# Patient Record
Sex: Female | Born: 1975
Health system: Southern US, Community
[De-identification: ages and names within clinical notes are randomized; demographics above are authoritative.]

## PROBLEM LIST (undated history)

## (undated) DIAGNOSIS — R45851 Suicidal ideations: Secondary | ICD-10-CM

## (undated) DIAGNOSIS — F3010 Manic episode without psychotic symptoms, unspecified: Secondary | ICD-10-CM

## (undated) DIAGNOSIS — F314 Bipolar disorder, current episode depressed, severe, without psychotic features: Secondary | ICD-10-CM

## (undated) DIAGNOSIS — F19929 Other psychoactive substance use, unspecified with intoxication, unspecified: Secondary | ICD-10-CM

## (undated) DIAGNOSIS — F332 Major depressive disorder, recurrent severe without psychotic features: Secondary | ICD-10-CM

## (undated) DIAGNOSIS — F1994 Other psychoactive substance use, unspecified with psychoactive substance-induced mood disorder: Secondary | ICD-10-CM

## (undated) DIAGNOSIS — R45850 Homicidal ideations: Secondary | ICD-10-CM

## (undated) DIAGNOSIS — J45909 Unspecified asthma, uncomplicated: Secondary | ICD-10-CM

## (undated) DIAGNOSIS — F1414 Cocaine abuse with cocaine-induced mood disorder: Secondary | ICD-10-CM

## (undated) DIAGNOSIS — J449 Chronic obstructive pulmonary disease, unspecified: Secondary | ICD-10-CM

## (undated) DIAGNOSIS — F32A Depression, unspecified: Secondary | ICD-10-CM

## (undated) DIAGNOSIS — F329 Major depressive disorder, single episode, unspecified: Secondary | ICD-10-CM

## (undated) HISTORY — PX: FINGER SURGERY: SHX640

---

## 2012-07-02 ENCOUNTER — Emergency Department (HOSPITAL_COMMUNITY)
Admission: EM | Admit: 2012-07-02 | Discharge: 2012-07-02 | Disposition: A | Discharge status: Home / Self Care | Payer: Self-pay | Attending: Emergency Medicine | Admitting: Emergency Medicine

## 2012-07-02 ENCOUNTER — Encounter (HOSPITAL_COMMUNITY): Payer: Self-pay | Admitting: Emergency Medicine

## 2012-07-02 DIAGNOSIS — F17200 Nicotine dependence, unspecified, uncomplicated: Secondary | ICD-10-CM | POA: Insufficient documentation

## 2012-07-02 DIAGNOSIS — R21 Rash and other nonspecific skin eruption: Secondary | ICD-10-CM | POA: Insufficient documentation

## 2012-07-02 DIAGNOSIS — Z8614 Personal history of Methicillin resistant Staphylococcus aureus infection: Secondary | ICD-10-CM | POA: Insufficient documentation

## 2012-07-02 MED ORDER — FAMOTIDINE 20 MG PO TABS: 20.0000 mg | ORAL_TABLET | Freq: Two times a day (BID) | ORAL | Status: DC

## 2012-07-02 MED ORDER — DIPHENHYDRAMINE HCL 25 MG PO CAPS
25.0000 mg | ORAL_CAPSULE | Freq: Once | ORAL | Status: AC
  Administered 2012-07-02: 25 mg via ORAL
  Filled 2012-07-02: qty 1

## 2012-07-02 MED ORDER — PREDNISONE 20 MG PO TABS: 60.0000 mg | ORAL_TABLET | Freq: Every day | ORAL | Status: AC

## 2012-07-02 MED ORDER — FAMOTIDINE 20 MG PO TABS
20.0000 mg | ORAL_TABLET | Freq: Once | ORAL | Status: AC
  Administered 2012-07-02: 20 mg via ORAL
  Filled 2012-07-02: qty 1

## 2012-07-02 MED ORDER — PREDNISONE 20 MG PO TABS
60.0000 mg | ORAL_TABLET | Freq: Once | ORAL | Status: AC
  Administered 2012-07-02: 60 mg via ORAL
  Filled 2012-07-02: qty 1

## 2012-07-02 NOTE — ED Provider Notes (Signed)
History     CSN: [id number]  Arrival date & time 07/02/12  0905   First MD Initiated Contact with Patient 07/02/12 [phone]      Chief Complaint  Patient presents with  . Rash    red raised , itching spots on extremeties. None on torso    (Consider location/radiation/quality/duration/timing/severity/associated sxs/prior treatment) HPI Comments: Patient reports that she has had a rash located on both arms and both legs for the past 5 days.  No rash on her trunk.  Rash is unchanged.  The rash does itch, but is not painful.  She has taken Benadryl, but does not feel that it helped.  She denies any recent changes in lotions, soaps, or detergents.  She has not been evaluated by anyone prior to coming to the ED today.  Patient is a 36 y.o. female presenting with rash. The history is provided by the patient.  Rash  This is a new problem. Episode onset: five days ago. The problem has not changed since onset.The problem is associated with nothing. There has been no fever. The rash is present on the right arm, left arm, left upper leg, left lower leg, right upper leg and right lower leg. The patient is experiencing no pain. Associated symptoms include itching. Pertinent negatives include no blisters, no pain and no weeping. She has tried antihistamines for the symptoms. The treatment provided no relief.    Past Medical History  Diagnosis Date  . MRSA (methicillin resistant Staphylococcus aureus)     surgery on finger 3 years ago    Past Surgical History  Procedure Date  . Finger surgery     Family History  Problem Relation Age of Onset  . Diabetes Mother   . Hypertension Mother     History  Substance Use Topics  . Smoking status: Current Everyday Smoker    Types: Cigarettes  . Smokeless tobacco: Not on file  . Alcohol Use: Yes    OB History    Grav Para Term Preterm Abortions TAB SAB Ect Mult Living                  Review of Systems  Constitutional: Negative for fever and  chills.  Respiratory: Negative for shortness of breath.   Gastrointestinal: Negative for nausea and vomiting.  Skin: Positive for itching and rash.  All other systems reviewed and are negative.    Allergies  Review of patient's allergies indicates no known allergies.  Home Medications   Current Outpatient Rx  Name Route Sig Dispense Refill  . DIPHENHYDRAMINE HCL 25 MG PO TABS Oral Take 50 mg by mouth every 6 (six) hours as needed. Skin rashes    . FEXOFENADINE-PSEUDOEPHED ER 60-120 MG PO TB12 Oral Take 1 tablet by mouth 2 (two) times daily. allergy      BP 122/89  Pulse 85  Temp 97.9 F (36.6 C) (Oral)  Resp 18  SpO2 100%  LMP 06/26/2012  Physical Exam  Nursing note and vitals reviewed. Constitutional: She is oriented to person, place, and time. She appears well-developed and well-nourished. No distress.  HENT:  Head: Normocephalic and atraumatic.  Mouth/Throat: Oropharynx is clear and moist and mucous membranes are normal.       No sign of airway obstruction. No edema of face, eyelids, lips, tongue, uvula.Marland Kitchen Uvula midline, no nasal congestion or drooling.  Tongue not elevated. No trismus.  Neck: Trachea normal, normal range of motion and full passive range of motion without pain. Neck supple.  No carotid bruits or stridor  Cardiovascular: Normal rate, regular rhythm, normal heart sounds, intact distal pulses and normal pulses.        Not tachycardic  Pulmonary/Chest: Effort normal and breath sounds normal. No stridor. No respiratory distress. She has no wheezes.  Musculoskeletal: Normal range of motion.  Neurological: She is alert and oriented to person, place, and time.  Skin: Skin is warm, dry and intact. Rash noted. No petechiae and no purpura noted. Rash is urticarial. She is not diaphoretic.       Diffuse erythematous papular blanchable rash located on both arms and both legs.    Psychiatric: She has a normal mood and affect. Her behavior is normal.    ED  Course  Procedures (including critical care time)  Labs Reviewed - No data to display No results found.   No diagnosis found.    MDM  Patient given Prednisone, Benadryl and Pepcid in ED.  Patient re-evaluated prior to dc, is hemodynamically stable, in no respiratory distress, and denies the feeling of throat closing. Pt has been advised to take OTC benadryl & return to the ED if they have a mod-severe allergic rxn (s/s including throat closing, difficulty breathing, swelling of lips face or tongue). Patient also given prescription for Prednisone. Pt is to follow up with their PCP. Pt is agreeable with plan & verbalizes understanding.        Pascal Lux [place], PA-C 07/02/12 1606

## 2012-07-02 NOTE — ED Notes (Signed)
Pt reports itching on red raised areas on all extremeties. Pt was staying with relatives last weekend. Rash unresponsive to OTC meds

## 2012-07-05 NOTE — ED Provider Notes (Signed)
Medical screening examination/treatment/procedure(s) were performed by non-physician practitioner and as supervising physician I was immediately available for consultation/collaboration.  Toy Baker, MD 07/05/12 [phone]

## 2012-12-15 ENCOUNTER — Encounter (HOSPITAL_COMMUNITY): Payer: Self-pay | Admitting: *Deleted

## 2012-12-15 ENCOUNTER — Emergency Department (HOSPITAL_COMMUNITY)
Admission: EM | Admit: 2012-12-15 | Discharge: 2012-12-15 | Disposition: A | Discharge status: Home / Self Care | Payer: Self-pay | Attending: Emergency Medicine | Admitting: Emergency Medicine

## 2012-12-15 DIAGNOSIS — R0602 Shortness of breath: Secondary | ICD-10-CM | POA: Insufficient documentation

## 2012-12-15 DIAGNOSIS — R220 Localized swelling, mass and lump, head: Secondary | ICD-10-CM | POA: Insufficient documentation

## 2012-12-15 DIAGNOSIS — L509 Urticaria, unspecified: Secondary | ICD-10-CM

## 2012-12-15 DIAGNOSIS — F17200 Nicotine dependence, unspecified, uncomplicated: Secondary | ICD-10-CM | POA: Insufficient documentation

## 2012-12-15 DIAGNOSIS — Z8614 Personal history of Methicillin resistant Staphylococcus aureus infection: Secondary | ICD-10-CM | POA: Insufficient documentation

## 2012-12-15 DIAGNOSIS — R1310 Dysphagia, unspecified: Secondary | ICD-10-CM | POA: Insufficient documentation

## 2012-12-15 DIAGNOSIS — L500 Allergic urticaria: Secondary | ICD-10-CM | POA: Insufficient documentation

## 2012-12-15 MED ORDER — PREDNISONE 10 MG PO TABS: 20.0000 mg | ORAL_TABLET | Freq: Every day | ORAL | Status: DC

## 2012-12-15 MED ORDER — FAMOTIDINE IN NACL 20-0.9 MG/50ML-% IV SOLN
20.0000 mg | Freq: Once | INTRAVENOUS | Status: AC
  Administered 2012-12-15: 20 mg via INTRAVENOUS
  Filled 2012-12-15: qty 50

## 2012-12-15 MED ORDER — METHYLPREDNISOLONE SODIUM SUCC 125 MG IJ SOLR
125.0000 mg | Freq: Once | INTRAMUSCULAR | Status: AC
  Administered 2012-12-15: 125 mg via INTRAVENOUS
  Filled 2012-12-15: qty 2

## 2012-12-15 NOTE — ED Notes (Signed)
Pt c/o swelling, itching, and welps since last night.  Has taken 4 Benedryl today.  Lower lip swelling, welts on neck, c/o SOB, difficulty swallowing.

## 2012-12-15 NOTE — ED Provider Notes (Signed)
History     CSN: [id number]  Arrival date & time 12/15/12  2014   First MD Initiated Contact with Patient 12/15/12 2032      Chief Complaint  Patient presents with  . Allergic Reaction    (Consider location/radiation/quality/duration/timing/severity/associated sxs/prior treatment) HPI Patient with rash began on neck last night now with diffuse rash.  Patient took benadryl 50 mg benadry three times today without relief.  Patient with no known exposure.  Patient without similar symptoms.  No new exposures.  Patient states she has some itching of throat and feels fb sensation.  No nausea or vomiting or dyspnea.    Past Medical History  Diagnosis Date  . MRSA (methicillin resistant Staphylococcus aureus)     surgery on finger 3 years ago    Past Surgical History  Procedure Date  . Finger surgery     Family History  Problem Relation Age of Onset  . Diabetes Mother   . Hypertension Mother     History  Substance Use Topics  . Smoking status: Current Every Day Smoker    Types: Cigarettes  . Smokeless tobacco: Not on file  . Alcohol Use: Yes    OB History    Grav Para Term Preterm Abortions TAB SAB Ect Mult Living                  Review of Systems  Constitutional: Negative for fever, chills, activity change, appetite change and unexpected weight change.  HENT: Negative for sore throat, rhinorrhea, neck pain, neck stiffness and sinus pressure.   Eyes: Negative for visual disturbance.  Respiratory: Negative for cough and shortness of breath.   Cardiovascular: Negative for chest pain and leg swelling.  Gastrointestinal: Negative for vomiting, abdominal pain, diarrhea and blood in stool.  Genitourinary: Negative for dysuria, urgency, frequency, vaginal discharge and difficulty urinating.  Musculoskeletal: Negative for myalgias, arthralgias and gait problem.  Skin: Negative for color change and rash.  Neurological: Negative for weakness, light-headedness and headaches.   Hematological: Does not bruise/bleed easily.  Psychiatric/Behavioral: Negative for dysphoric mood.    Allergies  Review of patient's allergies indicates no known allergies.  Home Medications   Current Outpatient Rx  Name  Route  Sig  Dispense  Refill  . DIPHENHYDRAMINE HCL 25 MG PO TABS   Oral   Take 50 mg by mouth every 6 (six) hours as needed. Skin rashes         . FAMOTIDINE 20 MG PO TABS   Oral   Take 1 tablet (20 mg total) by mouth 2 (two) times daily.   10 tablet   0   . FEXOFENADINE-PSEUDOEPHED ER 60-120 MG PO TB12   Oral   Take 1 tablet by mouth 2 (two) times daily. allergy           There were no vitals taken for this visit.  Physical Exam  Nursing note and vitals reviewed. Constitutional: She appears well-developed and well-nourished.  HENT:  Head: Normocephalic and atraumatic.  Eyes: Conjunctivae normal and EOM are normal. Pupils are equal, round, and reactive to light.  Neck: Normal range of motion. Neck supple.  Cardiovascular: Normal rate, regular rhythm, normal heart sounds and intact distal pulses.   Pulmonary/Chest: Effort normal and breath sounds normal.  Abdominal: Soft. Bowel sounds are normal.  Musculoskeletal: Normal range of motion.  Neurological: She is alert.  Skin: Skin is warm and dry. Rash noted.       Hives on lue and neck and bilateral  lower extremities.   Psychiatric: She has a normal mood and affect. Thought content normal.    ED Course  Procedures (including critical care time)  Labs Reviewed - No data to display No results found.   No diagnosis found.    MDM  Patient observed for one hour and continues to have clear lungs with normal or oropharynx exam, no difficulty swallowing or breathing.Plan discharge with prednisone and advised to follow up with allergist.         Hilario Quarry, MD 12/15/12 2116

## 2013-01-24 ENCOUNTER — Emergency Department (HOSPITAL_COMMUNITY)
Admission: EM | Admit: 2013-01-24 | Discharge: 2013-01-24 | Disposition: A | Discharge status: Home / Self Care | Payer: Self-pay | Attending: Emergency Medicine | Admitting: Emergency Medicine

## 2013-01-24 ENCOUNTER — Encounter (HOSPITAL_COMMUNITY): Payer: Self-pay | Admitting: *Deleted

## 2013-01-24 DIAGNOSIS — L03211 Cellulitis of face: Secondary | ICD-10-CM | POA: Insufficient documentation

## 2013-01-24 DIAGNOSIS — H5789 Other specified disorders of eye and adnexa: Secondary | ICD-10-CM | POA: Insufficient documentation

## 2013-01-24 DIAGNOSIS — Z79899 Other long term (current) drug therapy: Secondary | ICD-10-CM | POA: Insufficient documentation

## 2013-01-24 DIAGNOSIS — F17200 Nicotine dependence, unspecified, uncomplicated: Secondary | ICD-10-CM | POA: Insufficient documentation

## 2013-01-24 DIAGNOSIS — L0201 Cutaneous abscess of face: Secondary | ICD-10-CM | POA: Insufficient documentation

## 2013-01-24 DIAGNOSIS — Z8614 Personal history of Methicillin resistant Staphylococcus aureus infection: Secondary | ICD-10-CM | POA: Insufficient documentation

## 2013-01-24 DIAGNOSIS — L03213 Periorbital cellulitis: Secondary | ICD-10-CM

## 2013-01-24 MED ORDER — OXYCODONE-ACETAMINOPHEN 5-325 MG PO TABS: 1.0000 | ORAL_TABLET | ORAL | Status: DC | PRN

## 2013-01-24 MED ORDER — CLINDAMYCIN HCL 300 MG PO CAPS: ORAL_CAPSULE | ORAL | Status: DC

## 2013-01-24 MED ORDER — CLINDAMYCIN PHOSPHATE 900 MG/50ML IV SOLN
900.0000 mg | Freq: Once | INTRAVENOUS | Status: AC
  Administered 2013-01-24: 900 mg via INTRAVENOUS
  Filled 2013-01-24: qty 50

## 2013-01-24 NOTE — ED Notes (Signed)
Pt here with left eye swollen shut.  Pt advises eye has been swelling x 2 days and had gotten worse last night. Pt denies any pain to same.

## 2013-01-24 NOTE — ED Notes (Addendum)
Pt refused peripheral IV. Pt explained benefits and still refused. Dr. Shela Commons made aware.

## 2013-01-24 NOTE — ED Notes (Signed)
I&D kit set up at bedside

## 2013-01-24 NOTE — ED Provider Notes (Signed)
Patient with swelling over her left thigh which started as a "pimple" several days ago I has become swollen shut since yesterday. On exam there is a 1 cm abscess overlying left eyebrow eyelids are swollen and reddened eyes swollen shut upon my eyelids apart she the eye appears normal and she exhibits no pain on extraocular movement.left eyebrow IwithNCISION AND DRAINAGE Performed by: Doug Sou Consent: Verbal consent obtained. Risks and benefits: risks, benefits and alternatives were discussed Type: abscess  Body area:   Anesthesia: local infiltration  Incision was made with a scalpel.  Local anesthetic: lidocaine 2% with epinephrine  Anesthetic total: 1 ml  Complexity: complex Blunt dissection to break up loculations  Drainage: purulent  Drainage amount: moderate    Patient tolerance: Patient tolerated the procedure well with no immediate complications.  Assessmenrt patient exhibiting para orbital cellulitis as a complication of abscess to left eyebrow  Doug Sou, MD 01/24/13 1237

## 2013-01-24 NOTE — ED Notes (Signed)
Cleocin prescription instructions changed per dr Blinda Leatherwood to 150mg , 2 pills tid x10 days, dispense 60 (pharmacy called in with questions).

## 2013-01-24 NOTE — ED Provider Notes (Signed)
History     CSN: [id number]  Arrival date & time 01/24/13  4098   First MD Initiated Contact with Patient 01/24/13 1040      Chief Complaint  Patient presents with  . Facial Swelling    (Consider location/radiation/quality/duration/timing/severity/associated sxs/prior treatment) HPI Comments: 37 y.o. female presents today complaining of acute onset swelling of left eye. Pt says it started as a pimple in the eyebrow of left eye and then last night she felt it swelling up as she slept. Pt is unable to open eye spontaneously. Pt states though it is swollen, it is not very painful. Pt states she had a similar reaction to an unknown allergen and was treated with Prednisone. Course is constant. Pt has taken no interventions. Nothing makes it better or worse. Pt denies decreased vision, photophobia, recent fever, nausea/vomiting, numbness, trauma, or known allergen contact.  Patient is a 37 y.o. female presenting with eye problem.  Eye Problem  Associated symptoms include eye redness. Pertinent negatives include no numbness, no discharge, no photophobia, no nausea, no vomiting and no weakness.    Past Medical History  Diagnosis Date  . MRSA (methicillin resistant Staphylococcus aureus)     surgery on finger 3 years ago    Past Surgical History  Procedure Date  . Finger surgery     Family History  Problem Relation Age of Onset  . Diabetes Mother   . Hypertension Mother     History  Substance Use Topics  . Smoking status: Current Every Day Smoker -- 0.5 packs/day    Types: Cigarettes  . Smokeless tobacco: Not on file  . Alcohol Use: Yes    OB History    Grav Para Term Preterm Abortions TAB SAB Ect Mult Living                  Review of Systems  Constitutional: Negative for fever and diaphoresis.  HENT: Negative for neck pain and neck stiffness.   Eyes: Positive for pain and redness. Negative for photophobia, discharge, itching and visual disturbance.  Respiratory:  Negative for apnea, chest tightness and shortness of breath.   Cardiovascular: Negative for chest pain and palpitations.  Gastrointestinal: Negative for nausea, vomiting, diarrhea and constipation.  Genitourinary: Negative for dysuria.  Musculoskeletal: Negative for gait problem.  Skin: Negative for rash.  Neurological: Negative for dizziness, weakness, light-headedness, numbness and headaches.    Allergies  Review of patient's allergies indicates no known allergies.  Home Medications   Current Outpatient Rx  Name  Route  Sig  Dispense  Refill  . DIPHENHYDRAMINE HCL 25 MG PO TABS   Oral   Take 50 mg by mouth every 6 (six) hours as needed. Skin rashes         . IBUPROFEN 200 MG PO TABS   Oral   Take 600 mg by mouth every 6 (six) hours as needed.         Marland Kitchen PREDNISONE 10 MG PO TABS   Oral   Take 2 tablets (20 mg total) by mouth daily.   15 tablet   0     BP 134/88  Pulse 80  Temp 98.3 F (36.8 C) (Oral)  Resp 18  SpO2 100%  LMP 01/10/2013  Physical Exam  Nursing note and vitals reviewed. Constitutional: She is oriented to person, place, and time. She appears well-developed and well-nourished. No distress.  HENT:  Head: Normocephalic and atraumatic.  Eyes: EOM are normal. Pupils are equal, round, and reactive to light.  Left eye exhibits no discharge and no exudate. Left conjunctiva is injected. Left eye exhibits normal extraocular motion and no nystagmus.       Left eye is swollen shut and unable to open spontaneously, visual acuity 20/40 when eye is held open. No pain to EOM.  Neck: Normal range of motion. Neck supple.       No meningeal signs  Cardiovascular: Normal rate, regular rhythm and normal heart sounds.  Exam reveals no gallop and no friction rub.   No murmur heard. Pulmonary/Chest: Effort normal and breath sounds normal. No respiratory distress. She has no wheezes. She has no rales. She exhibits no tenderness.  Abdominal: Soft. Bowel sounds are normal.  She exhibits no distension. There is no tenderness. There is no rebound and no guarding.  Musculoskeletal: Normal range of motion. She exhibits no edema and no tenderness.  Neurological: She is alert and oriented to person, place, and time. No cranial nerve deficit.       No focal deficits. Cranial nerves intact.  Skin: Skin is warm and dry. She is not diaphoretic. No erythema.    ED Course  Procedures (including critical care time)  Labs Reviewed - No data to display No results found.   Diagnosis: periorbital cellulitis    MDM  Dr. Ethelda Chick examined and performed I&D of abscess under left eyelid. Periorbital cellulitis to be treated with clindamycin and pain managed with Percocet.  At this time there does not appear to be any evidence of an acute emergency medical condition and the patient appears stable for discharge with appropriate outpatient follow up in 2 days.  Glade Nurse, PA-C 01/24/13 2213

## 2013-01-24 NOTE — Discharge Instructions (Signed)
1. Warm compresses over left eye 2. Pain medicine as directed as needed 3. Follow up in 2 days to have the eye checked (see resource guide)   Periorbital Cellulitis Periorbital cellulitis is a common infection that can affect the eyelid and the soft tissues that surround the eyeball. The infection may also affect the structures that produce and drain tears. It does not affect the eyeball itself. Natural tissue barriers usually prevent the spread of this infection to the eyeball and other deeper areas of the eye socket.  CAUSES  Bacterial infection.  Long-term (chronic) sinus infections.  An object (foreign body) stuck behind the eye.  An injury that goes through the eyelid tissues.  An injury that causes an infection, such as an insect sting.  Fracture of the bone around the eye.  Infections which have spread from the eyelid or other structures around the eye.  Bite wounds.  Inflammation or infection of the lining membranes of the brain (meningitis).  An infection in the blood (septicemia).  Dental infection (abscess).  Viral infection (this is rare). SYMPTOMS Symptoms usually come on suddenly.  Pain in the eye.  Red, hot, and swollen eyelids and possibly cheeks. The swelling is sometimes bad enough that the eyelids cannot open. Some infections make the eyelids look purple.  Fever and feeling generally ill.  Pain when touching the area around the eye. DIAGNOSIS  Periorbital cellulitis can be diagnosed from an eye exam. In severe cases, your caregiver might suggest:  Blood tests.  Imaging tests (such as a CT scan) to examine the sinuses and the area around and behind the eyeball. TREATMENT If your caregiver feels that you do not have any signs of serious infection, treatment may include:  Antibiotics.  Nasal decongestants to reduce swelling.  Referral to a dentist if it is suspected that the infection was caused by a prior tooth infection.  Examination  every day to make sure the problem is improving. HOME CARE INSTRUCTIONS  Take your antibiotics as directed. Finish them even if you start to feel better.  Some pain is normal with this condition. Take pain medicine as directed by your caregiver. Only take pain medicines approved by your caregiver.  It is important to drink fluids. Drink enough water and fluids to keep your urine clear or pale yellow.  Do not smoke.  Rest and get plenty of sleep.  Mild or moderate fevers generally have no long-term effects and often do not require treatment.  If your caregiver has given you a follow-up appointment, it is very important to keep that appointment. Your caregiver will need to make sure that the infection is getting better. It is important to check that a more serious infection is not developing. SEEK IMMEDIATE MEDICAL CARE IF:  Your eyelids become more painful, red, warm, or swollen.  You develop double vision or your vision becomes blurred or worsens in any way.  You have trouble moving your eyes.  The eye looks like it is popping out (proptosis).  You develop a severe headache, severe neck pain, or neck stiffness.  You develop repeated vomiting.  You have a fever or persistent symptoms for more than 72 hours.  You have a fever and your symptoms suddenly get worse. MAKE SURE YOU:  Understand these instructions.  Will watch your condition.  Will get help right away if you are not doing well or get worse. Document Released: 01/16/2011 Document Revised: 03/07/2012 Document Reviewed: 01/16/2011 [facility] Patient Information 2013 [place], [place].  RESOURCE GUIDE  Chronic Pain Problems: Contact [facility]  [phone] Patients need to be referred by their primary care doctor.  Insufficient Money for Medicine: Contact United Way:  call "211."   No Primary Care Doctor: - Call Health Connect  [phone] - can help you locate a primary care doctor that  accepts  your insurance, provides certain services, etc. - Physician Referral Service- [phone]  Agencies that provide inexpensive medical care: - [facility]  [phone] - [facility]  [phone] - [facility]  [phone] - Women's Clinic  [phone] - Planned Parenthood  [phone] - [facility]  [phone]  Medicaid-accepting [facility] Providers: - [facility]- [address]  [phone], Mon-Fri 9am-7pm, Sat 9am-1pm - [facility]- [address] [place], Suite [place]  [phone] - [facility]- [address], Suite [place]  [phone] [facility]- [address]  [phone] - Renaye Rakers- [address] [place], [address], [phone]  Only accepts [place] patients after they have their name  applied to their card  Self Pay (no insurance) in [place]: - Sickle Cell Patients: Dr Willey Blade, [facility]  [address] [place], [phone] - [facility] Urgent Care- [address] [place]  [phone]       [facility] [place]- [address]       -     [facility]- see information above (Speak to [employer] if you do not have insurance)       -  [facility]- [address],  [phone]       -  [facility]- [address], [phone]       -  Dr Julio Sicks-  [address], [place], [phone]       -  [facility] - [address] [phone]       -  [facility]- [address] [phone], also [address] [phone]       -    [facility]- [address] [place], [phone], 1st & 3rd Saturday        every month, 10am-1pm  1) Find a Doctor and Pay Out of Pocket Although you won't have to find out who is covered by your insurance plan, it is a good idea to ask around and get recommendations. You  will then need to call the office and see if the doctor you have chosen will accept you as a new patient and what types of options they offer for patients who are self-pay. Some doctors offer discounts or will set up payment plans for their patients who do not have insurance, but you will need to ask so you aren't surprised when you get to your appointment.  2) Contact Your Local Health Department Not all health departments have doctors that can see patients for sick visits, but many do, so it is worth a call to see if yours does. If you don't know where your local health department is, you can check in your phone book. The CDC also has a tool to help you locate your state's health department, and many state websites also have listings of all of their local health departments.  3) Find a Walk-in  Clinic If your illness is not likely to be very severe or complicated, you may want to try a walk in clinic. These are popping up all over the country in pharmacies, drugstores, and shopping centers. They're usually staffed by nurse practitioners or physician assistants that have been trained to treat common illnesses and complaints. They're usually fairly quick and inexpensive. However, if you have serious medical issues or chronic medical problems, these are probably not your best option  STD Testing - [facility] Department of [facility], STD Clinic, [address], [place], phone [phone] or [phone].  Monday - Friday, call for an appointment. [facility] of [employer], STD Clinic, [address], [place], phone [phone] or [phone].  Monday - Friday, call for an appointment.  Abuse/Neglect: [facility] Child Abuse Hotline 478-014-3976 [facility] Child Abuse Hotline 505-021-4579 (After Hours)  Emergency Shelter:  [facility] (873)[phone]  Maternity Homes: - Room at the [place] [facility] [phone] - [facility] (920)[phone]  MRSA Hotline #:   951-329-5811  [facility] [place]  [employer] [facility] Dept. [address]                 [address]         [address] [place] [address]  Blondell Reveal Phone:  [phone]                                  Phone:  [phone]                   Phone:  [phone]  [facility], [phone] - [facility] - [facility] [place]- [phone]       -     [facility] in [place], [address],             [phone], Insurance  [place] Child Abuse Hotline 629-383-5079 or (445) 564-6097 (After Hours)  Dental Assistance  If unable to pay or uninsured, contact:  [facility]. to become qualified for the adult dental clinic.  Patients with Medicaid: [facility] 716-485-7564 [address], [phone] [address] [phone]  If unable to pay, or uninsured, contact [facility] [phone] in [place], [phone] in [facility]) to become qualified for the adult dental clinic  Other Low-Cost Community Dental Services: - [facility]- [address] [place], [place], [place], [address], [phone], Ext. [address], 2nd and 4th Thursday of the month at 6:30am.  10 clients each day by appointment, can sometimes see walk-in patients if someone does not show for an appointment. [facility] [address] [place] [place], [place], [address], [phone] - [facility]- [address] [place], [place], [address], [phone] [facility] Health Department- [phone] - [facility]  Department- [phone] [facility] [phone]       Behavioral Health Resources in the [facility]  Intensive Outpatient  Programs: [facility]      [address] [place], [place] [phone] Both a day and evening program       [facility] Outpatient     [address]        [place], [place] [address] [phone]         ADS: [facility] [address] [place] [phone]  [facility] Mental Health ACCESS LINE: (807)857-0607 or [phone] [address] [place], [place] [address] [email or web address]  Behavioral Health Services  Substance Abuse Resources: - [facility] Drug Services  (402)679-1373 - [facility] 7804906828 - [facility] [phone] Floydene Flock [phone] - Residential & Outpatient Substance Abuse Program  [phone]  Psychological Services: [facility]  [phone] [facility] Services  [phone]) [phone] - [facility], 502-037-6681 [place]. [address] [place], ACCESS LINE: 5192416702 or (765)[phone], [email or web address]  Mobile Crisis Teams:                                        Therapeutic Alternatives         Mobile Crisis Care Unit [phone]             [facility] [address] Ginette Otto [phone]                                         Interventionist [address] [place] [phone]  Self-Help/Support Groups: Mental Health Assoc. of [employer] of support groups 442-586-6705 (call for more info)   Narcotics Anonymous (NA) Caring Services [address] [place] - 2 meetings at this location  Residential Treatment Programs:  [facility]      [address]        [place]       [phone]         [facility] [address] [place] [address] [place], [place]  [address] [phone]  [facility]  [address] [place] [address] [place], [place]  [address] [phone] Admissions: 8am-3pm M-F  [facility]     [address]        [place], [place] [address]       [phone]         [facility] [address] [place], [place] [phone]  [facility] [address] [place], [place] [phone]  [facility] - Intensive Outpatient      [address]     [place], [place]       [phone]         [facility] ([facility]     [address] [place], [place] [phone] or [phone])[phone]  Residential Treatment Services (RTS), Medicaid [address] [place], [place] [phone]  [facility] Margo Aye                                               [phone]  Otho Perl [place] [phone]  [facility] Resources: [facility] [place]- [phone]               General Therapy                                                Angie Fava, PhD        [address] [place], [place] [address]         [phone]   Insurance  [facility]   [address] [place], [place] [address] [phone]  [facility] Recovery [address] [place], [place] [address] [phone] Insurance/Medicaid/sponsorship through [facility]                                              [address]                                        [place], [place] [address]    Therapy/tele-psych/case         [phone]          [facility] [address], [place]  [address]  Adolescent/group home/case management [phone]                                           Creola Corn PhD       General therapy       Insurance   [phone]         Dr. Lolly Mustache, Insurance, M-F [phone]

## 2013-01-24 NOTE — ED Notes (Signed)
Dr. Shela Commons spoke with pt about need for IV, pt reported she is just nervous and said she would allow one now.

## 2013-01-24 NOTE — ED Notes (Signed)
Pt laying in bed, pt has left eye swelling. Pt reports she has some drainage from eye and tender to touch.

## 2013-01-26 NOTE — ED Provider Notes (Signed)
Medical screening examination/treatment/procedure(s) were conducted as a shared visit with non-physician practitioner(s) and myself.  I personally evaluated the patient during the encounter  Doug Sou, MD 01/26/13 [phone]

## 2013-04-14 ENCOUNTER — Encounter (HOSPITAL_COMMUNITY): Payer: Self-pay | Admitting: Emergency Medicine

## 2013-04-14 ENCOUNTER — Emergency Department (HOSPITAL_COMMUNITY)
Admission: EM | Admit: 2013-04-14 | Discharge: 2013-04-14 | Disposition: A | Discharge status: Home / Self Care | Payer: Self-pay | Attending: Emergency Medicine | Admitting: Emergency Medicine

## 2013-04-14 DIAGNOSIS — IMO0001 Reserved for inherently not codable concepts without codable children: Secondary | ICD-10-CM | POA: Insufficient documentation

## 2013-04-14 DIAGNOSIS — F17200 Nicotine dependence, unspecified, uncomplicated: Secondary | ICD-10-CM | POA: Insufficient documentation

## 2013-04-14 DIAGNOSIS — Z8614 Personal history of Methicillin resistant Staphylococcus aureus infection: Secondary | ICD-10-CM | POA: Insufficient documentation

## 2013-04-14 DIAGNOSIS — M79671 Pain in right foot: Secondary | ICD-10-CM

## 2013-04-14 DIAGNOSIS — M79609 Pain in unspecified limb: Secondary | ICD-10-CM | POA: Insufficient documentation

## 2013-04-14 DIAGNOSIS — B351 Tinea unguium: Secondary | ICD-10-CM | POA: Insufficient documentation

## 2013-04-14 LAB — GLUCOSE, CAPILLARY: Glucose-Capillary: 99 mg/dL (ref 70–99)

## 2013-04-14 MED ORDER — CLOTRIMAZOLE 1 % EX CREA: TOPICAL_CREAM | CUTANEOUS | Status: DC

## 2013-04-14 MED ORDER — IBUPROFEN 600 MG PO TABS: 600.0000 mg | ORAL_TABLET | Freq: Four times a day (QID) | ORAL | Status: DC | PRN

## 2013-04-14 NOTE — ED Notes (Signed)
NAD noted at time of d/c home 

## 2013-04-14 NOTE — ED Provider Notes (Signed)
History     CSN: [id number]  Arrival date & time 04/14/13  4098   First MD Initiated Contact with Patient 04/14/13 [phone]      Chief Complaint  Patient presents with  . Foot Pain    (Consider location/radiation/quality/duration/timing/severity/associated sxs/prior treatment) HPI  37 year old female presents complaining of foot pain. Patient reports for the past 2-3 weeks she has had pain to the bottom of both foot. Pain is primarily at the ball of her feet. Pain is described as a achy sensation and sharp while walking. She has been resting for the past 2 days which has improved the pain. Otherwise she has not had any specific treatment. She does wear this many different pair of shoes but no high heels. She denies any recent trauma. She denies any fever chills or rash. Denies any numbness or weakness. Denies any coolness sensation to the feet. She is currently not working. Does admits to occasional cocaine use last use several days ago. Reports having family history of diabetes and was concerned about having diabetes. Does report increase polyuria and polydipsia.  Past Medical History  Diagnosis Date  . MRSA (methicillin resistant Staphylococcus aureus)     surgery on finger 3 years ago    Past Surgical History  Procedure Laterality Date  . Finger surgery      Family History  Problem Relation Age of Onset  . Diabetes Mother   . Hypertension Mother     History  Substance Use Topics  . Smoking status: Current Every Day Smoker -- 0.50 packs/day    Types: Cigarettes  . Smokeless tobacco: Not on file  . Alcohol Use: Yes    OB History   Grav Para Term Preterm Abortions TAB SAB Ect Mult Living                  Review of Systems  Constitutional: Negative for fever.  Musculoskeletal: Positive for myalgias. Negative for back pain.  Skin: Negative for rash and wound.  Neurological: Negative for dizziness, tremors, weakness and numbness.    Allergies  Review of patient's  allergies indicates no known allergies.  Home Medications   Current Outpatient Rx  Name  Route  Sig  Dispense  Refill  . diphenhydrAMINE (BENADRYL) 25 MG tablet   Oral   Take 50 mg by mouth every 6 (six) hours as needed. Skin rashes           There were no vitals taken for this visit.  Physical Exam  Nursing note and vitals reviewed. Constitutional: She appears well-developed and well-nourished. No distress.  HENT:  Head: Atraumatic.  Eyes: Conjunctivae are normal.  Neck: Neck supple.  Cardiovascular: Intact distal pulses.   Musculoskeletal: Normal range of motion. She exhibits tenderness (mild tenderness to the ball of both feet without swelling, deformity, crepitus, or rash noted.  brisk cap refill .  Normal dorsi/plantar flexion.  evidence of onychomycosis to toenails). She exhibits no edema.  Able to ambulate without difficulty  Neurological: She is alert.  Skin: Skin is warm. No rash noted.    ED Course  Procedures (including critical care time)  9:16 AM Patient with pain to the balls of her feet for the past several weeks. She is neurovascularly intact. No evidence of infection. No evidence of foreign object. She is able to ambulate without difficulty.  Pain is MSK.  Doubt gout.  No joint involvement. She does have evidence of mild onychomycosis to all toes. Lomitrin prescribed along with care instruction.  9:39 AM Normal CBG  Labs Reviewed  GLUCOSE, CAPILLARY   No results found.   1. Foot pain, bilateral   2. Onychomycosis of toenail       MDM  BP 120/93  Pulse 85  Temp(Src) 98 F (36.7 C) (Oral)  Ht 5\' 3"  (1.6 m)  Wt 160 lb (72.576 kg)  BMI 28.35 kg/m2  SpO2 100%  I have reviewed nursing notes and vital signs. I personally reviewed the imaging tests through PACS system  I reviewed available ER/hospitalization records thought the EMR         Fayrene Helper, [place] 04/14/13 0940

## 2013-04-14 NOTE — ED Provider Notes (Signed)
Medical screening examination/treatment/procedure(s) were performed by non-physician practitioner and as supervising physician I was immediately available for consultation/collaboration.    Nelia Shi, MD 04/14/13 [phone]

## 2013-04-14 NOTE — ED Notes (Signed)
Pt c/o bilateral foot pain to the bottom of both feet x3 weeks. Pain has increased the last 2 days and per pt "I've had to stay in the bed". Pain increases with activity. NAD noted, family/friend at bedside.

## 2013-04-14 NOTE — ED Notes (Signed)
Laveda Norman, PA-C in room at this time.

## 2013-04-14 NOTE — ED Notes (Signed)
Notified RN of CBG 99 

## 2013-12-28 NOTE — L&D Delivery Note (Signed)
Delivery Note At 1:00 AM a viable female was delivered via Vaginal, Spontaneous Delivery (Presentation: ROA).  APGAR: 8, ; weight TBD.   Placenta status: Intact, Spontaneous.  Cord: 3 vessels with the following complications: likely terminal abruption.   Anesthesia: None  Episiotomy: None Lacerations: none Suture Repair: na Est. Blood Loss (mL): 700  Mom to postpartum.  Baby to NICU.  [facility] to [facility] ED for a [id number] with unknown GA and no PNC who presented in labor with bleeding.  AROM performed once nicu arrived.  Pt was uncooperative and moving around in the bed and unwilling to push for an extended period of time.  She then started bleeding with approx 500cc of blood coming out onto the bed over the course of the next 10 min.  FHT remained reassuring.  An anterior lip was pushed back without difficulty.  Finally pt chose to push and with 2 pushes delivered a liveborn female via NSVD with spontaneous cry.   Cord cut and clamped quickly and baby taken to warmer with NICU.  Placenta delivered intact with 3V cord via traction and pitocin.  no tear. No complications.  [facility] for [facility] care.  Baby to NICU.    Dorothie Wah L Rashea [place] 06/05/2014, 2:02 AM

## 2014-06-04 ENCOUNTER — Inpatient Hospital Stay (HOSPITAL_COMMUNITY)
Admission: EM | Admit: 2014-06-04 | Discharge: 2014-06-06 | DRG: 775 | Disposition: A | Discharge status: Home / Self Care | Payer: Medicaid Other | Attending: Obstetrics and Gynecology | Admitting: Obstetrics and Gynecology

## 2014-06-04 ENCOUNTER — Encounter (HOSPITAL_COMMUNITY): Payer: Self-pay | Admitting: Emergency Medicine

## 2014-06-04 DIAGNOSIS — F1410 Cocaine abuse, uncomplicated: Secondary | ICD-10-CM | POA: Diagnosis present

## 2014-06-04 DIAGNOSIS — O99334 Smoking (tobacco) complicating childbirth: Secondary | ICD-10-CM | POA: Diagnosis present

## 2014-06-04 DIAGNOSIS — Z8614 Personal history of Methicillin resistant Staphylococcus aureus infection: Secondary | ICD-10-CM

## 2014-06-04 DIAGNOSIS — F1210 Cannabis abuse, uncomplicated: Secondary | ICD-10-CM | POA: Diagnosis present

## 2014-06-04 DIAGNOSIS — O99344 Other mental disorders complicating childbirth: Secondary | ICD-10-CM | POA: Diagnosis present

## 2014-06-04 LAB — I-STAT CHEM 8, ED
BUN: 11 mg/dL (ref 6–23)
CALCIUM ION: 1.19 mmol/L (ref 1.12–1.23)
CHLORIDE: 102 meq/L (ref 96–112)
Creatinine, Ser: 0.8 mg/dL (ref 0.50–1.10)
GLUCOSE: 103 mg/dL — AB (ref 70–99)
HCT: 35 % — ABNORMAL LOW (ref 36.0–46.0)
Hemoglobin: 11.9 g/dL — ABNORMAL LOW (ref 12.0–15.0)
POTASSIUM: 3.5 meq/L — AB (ref 3.7–5.3)
Sodium: 138 mEq/L (ref 137–147)
TCO2: 19 mmol/L (ref 0–100)

## 2014-06-04 LAB — CBC
HEMATOCRIT: 32.7 % — AB (ref 36.0–46.0)
HEMOGLOBIN: 11.3 g/dL — AB (ref 12.0–15.0)
MCH: 31.4 pg (ref 26.0–34.0)
MCHC: 34.6 g/dL (ref 30.0–36.0)
MCV: 90.8 fL (ref 78.0–100.0)
Platelets: 183 10*3/uL (ref 150–400)
RBC: 3.6 MIL/uL — ABNORMAL LOW (ref 3.87–5.11)
RDW: 13.8 % (ref 11.5–15.5)
WBC: 13.5 10*3/uL — ABNORMAL HIGH (ref 4.0–10.5)

## 2014-06-04 MED ORDER — BETAMETHASONE SOD PHOS & ACET 6 (3-3) MG/ML IJ SUSP
12.0000 mg | Freq: Once | INTRAMUSCULAR | Status: AC
  Administered 2014-06-05: 12 mg via INTRAMUSCULAR
  Filled 2014-06-04: qty 2

## 2014-06-04 NOTE — ED Notes (Signed)
Dr. Beck at bedside

## 2014-06-04 NOTE — ED Notes (Signed)
MD at bedside. 

## 2014-06-04 NOTE — ED Notes (Signed)
Pt to ED with c/o being preg. And having contractions.  St's she has not had any prenatal care.  Also st's she has had bleeding and fluid coming from vagina.  St's she is G-4   P-3  With all 3 being premature at 6 months.

## 2014-06-04 NOTE — ED Provider Notes (Signed)
CSN: [id number]     Arrival date & time 06/04/14  2310 History   None    No chief complaint on file.  Level V caveat acuity of situation (Consider location/radiation/quality/duration/timing/severity/associated sxs/prior Treatment) HPI Complains of feeling uterine contractions onset 2 hours ago accompanied by slight amount of vaginal bleeding and slight amount of vaginal fluid. Contractions, possibly every 10 minutes. Patient had no prenatal care. No other associated symptoms. He is uncertain of her lastmenstrual  period. Past Medical History  Diagnosis Date  . MRSA (methicillin resistant Staphylococcus aureus)     surgery on finger 3 years ago   Past Surgical History  Procedure Laterality Date  . Finger surgery     Family History  Problem Relation Age of Onset  . Diabetes Mother   . Hypertension Mother    History  Substance Use Topics  . Smoking status: Current Every Day Smoker -- 0.50 packs/day    Types: Cigarettes  . Smokeless tobacco: Not on file  . Alcohol Use: Yes   denies alcohol use admits to crack cocaine use 3 weeks ago OB History   Grav Para Term Preterm Abortions TAB SAB Ect Mult Living                 Review of Systems  Unable to perform ROS: Acuity of condition      Allergies  Review of patient's allergies indicates no known allergies.  Home Medications   Prior to Admission medications   Medication Sig Start Date End Date Taking? Authorizing Provider  clotrimazole (LOTRIMIN) 1 % cream Apply to all toenails 2 times daily until symptoms cleared 04/14/13   Domenic Moras, PA-C  diphenhydrAMINE (BENADRYL) 25 MG tablet Take 50 mg by mouth every 6 (six) hours as needed. Skin rashes    Historical Provider, MD  ibuprofen (ADVIL,MOTRIN) 600 MG tablet Take 1 tablet (600 mg total) by mouth every 6 (six) hours as needed for pain. 04/14/13   Domenic Moras, PA-C   BP 127/78  Pulse 99  Resp 28  SpO2 99% Physical Exam  Nursing note and vitals reviewed. Constitutional: She  appears well-developed and well-nourished. She appears distressed.  Appears uncomfortable  HENT:  Head: Normocephalic and atraumatic.  Eyes: Conjunctivae are normal. Pupils are equal, round, and reactive to light.  Neck: Neck supple. No tracheal deviation present. No thyromegaly present.  Cardiovascular: Normal rate and regular rhythm.   No murmur heard. Pulmonary/Chest: Effort normal and breath sounds normal.  Abdominal: Soft. She exhibits distension. There is no tenderness.  Gravid fetal heart tones 135  Genitourinary:  No crowding. A digital vaginal exam wasn't performed after perineum was prepped with Betadine with sterile gloves. Patient's cervix is dilated . I feel the baby's head in the vaginal canal. Cervix feels dilated  Musculoskeletal: Normal range of motion. She exhibits no edema and no tenderness.  Neurological: She is alert. Coordination normal.  Skin: Skin is warm and dry. No rash noted.  Psychiatric: She has a normal mood and affect.    ED Course  Procedures (including critical care time) Labs Review Labs Reviewed - No data to display Patient examined by rapid response OB nurse and found to be 8 cm dilated, fully effaced. Obstetrician on-callDr. Elly Modena and neonatologistDr. [facility] consult by me. Delivery felt to be imminent Imaging Review No results found. Dr, Olevia Bowens, obstetrician arrived 11:55 PM  EKG Interpretation None     Baby was delivered by [facility] MDM  Mother and baby will be transferred to [facility] hospital Final  diagnoses:  None  Dx spontaneous vaginal delivery CRITICAL CARE Performed by: Orlie Dakin Total critical care time: 30 minute Critical care time was exclusive of separately billable procedures and treating other patients. Critical care was necessary to treat or prevent imminent or life-threatening deterioration. Critical care was time spent personally by me on the following activities: development of treatment plan with patient and/or  surrogate as well as nursing, discussions with consultants, evaluation of patient's response to treatment, examination of patient, obtaining history from patient or surrogate, ordering and performing treatments and interventions, ordering and review of laboratory studies, ordering and review of radiographic studies, pulse oximetry and re-evaluation of patient's condition.     Orlie Dakin, MD 06/05/14 [phone]

## 2014-06-04 NOTE — ED Notes (Signed)
OB rapid response at bedside 

## 2014-06-04 NOTE — ED Notes (Signed)
OB rapid response states pt is 8 cm dilated, 100% effaced

## 2014-06-05 ENCOUNTER — Encounter (HOSPITAL_COMMUNITY): Payer: Self-pay | Admitting: Emergency Medicine

## 2014-06-05 ENCOUNTER — Encounter: Payer: Self-pay | Admitting: Family Medicine

## 2014-06-05 DIAGNOSIS — Z8614 Personal history of Methicillin resistant Staphylococcus aureus infection: Secondary | ICD-10-CM | POA: Diagnosis not present

## 2014-06-05 DIAGNOSIS — O99344 Other mental disorders complicating childbirth: Secondary | ICD-10-CM

## 2014-06-05 DIAGNOSIS — F1210 Cannabis abuse, uncomplicated: Secondary | ICD-10-CM | POA: Diagnosis present

## 2014-06-05 DIAGNOSIS — F1410 Cocaine abuse, uncomplicated: Secondary | ICD-10-CM | POA: Diagnosis present

## 2014-06-05 DIAGNOSIS — O99334 Smoking (tobacco) complicating childbirth: Secondary | ICD-10-CM | POA: Diagnosis present

## 2014-06-05 DIAGNOSIS — Z30017 Encounter for initial prescription of implantable subdermal contraceptive: Secondary | ICD-10-CM

## 2014-06-05 LAB — URINALYSIS, ROUTINE W REFLEX MICROSCOPIC
Bilirubin Urine: NEGATIVE
Glucose, UA: NEGATIVE mg/dL
Hgb urine dipstick: NEGATIVE
KETONES UR: NEGATIVE mg/dL
NITRITE: NEGATIVE
PH: 6 (ref 5.0–8.0)
Protein, ur: NEGATIVE mg/dL
Specific Gravity, Urine: 1.021 (ref 1.005–1.030)
UROBILINOGEN UA: 1 mg/dL (ref 0.0–1.0)

## 2014-06-05 LAB — RAPID URINE DRUG SCREEN, HOSP PERFORMED
Amphetamines: NOT DETECTED
BARBITURATES: NOT DETECTED
BENZODIAZEPINES: NOT DETECTED
Cocaine: POSITIVE — AB
Opiates: NOT DETECTED
Tetrahydrocannabinol: POSITIVE — AB

## 2014-06-05 LAB — RPR

## 2014-06-05 LAB — RAPID HIV SCREEN (WH-MAU): SUDS RAPID HIV SCREEN: NONREACTIVE

## 2014-06-05 LAB — ABO/RH: ABO/RH(D): O POS

## 2014-06-05 LAB — URINE MICROSCOPIC-ADD ON

## 2014-06-05 LAB — HEPATITIS B SURFACE ANTIGEN
HEP B S AG: NEGATIVE
Hepatitis B Surface Ag: NEGATIVE

## 2014-06-05 LAB — MRSA PCR SCREENING: MRSA BY PCR: NEGATIVE

## 2014-06-05 MED ORDER — DIBUCAINE 1 % RE OINT: 1.0000 "application " | TOPICAL_OINTMENT | RECTAL | Status: DC | PRN

## 2014-06-05 MED ORDER — DIPHENHYDRAMINE HCL 25 MG PO CAPS: 25.0000 mg | ORAL_CAPSULE | Freq: Four times a day (QID) | ORAL | Status: DC | PRN

## 2014-06-05 MED ORDER — LANOLIN HYDROUS EX OINT: TOPICAL_OINTMENT | CUTANEOUS | Status: DC | PRN

## 2014-06-05 MED ORDER — MEASLES, MUMPS & RUBELLA VAC ~~LOC~~ INJ
0.5000 mL | INJECTION | Freq: Once | SUBCUTANEOUS | Status: DC
  Filled 2014-06-05: qty 0.5

## 2014-06-05 MED ORDER — ONDANSETRON HCL 4 MG/2ML IJ SOLN: 4.0000 mg | INTRAMUSCULAR | Status: DC | PRN

## 2014-06-05 MED ORDER — OXYTOCIN 40 UNITS IN LACTATED RINGERS INFUSION - SIMPLE MED
62.5000 mL/h | INTRAVENOUS | Status: DC
  Administered 2014-06-05: 2 m[IU]/min via INTRAVENOUS
  Filled 2014-06-05: qty 1000

## 2014-06-05 MED ORDER — PRENATAL MULTIVITAMIN CH
1.0000 | ORAL_TABLET | Freq: Every day | ORAL | Status: DC
  Administered 2014-06-05 – 2014-06-06 (×2): 1 via ORAL
  Filled 2014-06-05 (×2): qty 1

## 2014-06-05 MED ORDER — OXYCODONE-ACETAMINOPHEN 5-325 MG PO TABS
1.0000 | ORAL_TABLET | ORAL | Status: DC | PRN
  Administered 2014-06-05 (×2): 1 via ORAL
  Administered 2014-06-05: 2 via ORAL
  Filled 2014-06-05: qty 1
  Filled 2014-06-05: qty 2
  Filled 2014-06-05: qty 1

## 2014-06-05 MED ORDER — BENZOCAINE-MENTHOL 20-0.5 % EX AERO
1.0000 "application " | INHALATION_SPRAY | CUTANEOUS | Status: DC | PRN
  Administered 2014-06-05: 1 via TOPICAL
  Filled 2014-06-05: qty 56

## 2014-06-05 MED ORDER — ONDANSETRON HCL 4 MG PO TABS: 4.0000 mg | ORAL_TABLET | ORAL | Status: DC | PRN

## 2014-06-05 MED ORDER — SENNOSIDES-DOCUSATE SODIUM 8.6-50 MG PO TABS
2.0000 | ORAL_TABLET | ORAL | Status: DC
  Administered 2014-06-05: 2 via ORAL
  Filled 2014-06-05: qty 2

## 2014-06-05 MED ORDER — LACTATED RINGERS IV SOLN
INTRAVENOUS | Status: DC
  Administered 2014-06-05: via INTRAVENOUS

## 2014-06-05 MED ORDER — ZOLPIDEM TARTRATE 5 MG PO TABS: 5.0000 mg | ORAL_TABLET | Freq: Every evening | ORAL | Status: DC | PRN

## 2014-06-05 MED ORDER — TETANUS-DIPHTH-ACELL PERTUSSIS 5-2.5-18.5 LF-MCG/0.5 IM SUSP
0.5000 mL | Freq: Once | INTRAMUSCULAR | Status: AC
  Administered 2014-06-06: 0.5 mL via INTRAMUSCULAR
  Filled 2014-06-05: qty 0.5

## 2014-06-05 MED ORDER — IBUPROFEN 600 MG PO TABS
600.0000 mg | ORAL_TABLET | Freq: Four times a day (QID) | ORAL | Status: DC
  Administered 2014-06-05 – 2014-06-06 (×7): 600 mg via ORAL
  Filled 2014-06-05 (×7): qty 1

## 2014-06-05 MED ORDER — SIMETHICONE 80 MG PO CHEW
80.0000 mg | CHEWABLE_TABLET | ORAL | Status: DC | PRN
  Administered 2014-06-06 (×2): 80 mg via ORAL
  Filled 2014-06-05 (×3): qty 1

## 2014-06-05 MED ORDER — WITCH HAZEL-GLYCERIN EX PADS: 1.0000 "application " | MEDICATED_PAD | CUTANEOUS | Status: DC | PRN

## 2014-06-05 MED ORDER — FENTANYL CITRATE 0.05 MG/ML IJ SOLN
INTRAMUSCULAR | Status: AC
  Administered 2014-06-05: 100 ug via INTRAVENOUS
  Filled 2014-06-05: qty 2

## 2014-06-05 MED ORDER — FENTANYL CITRATE 0.05 MG/ML IJ SOLN
100.0000 ug | Freq: Once | INTRAMUSCULAR | Status: AC
  Administered 2014-06-05: 100 ug via INTRAVENOUS

## 2014-06-05 NOTE — ED Notes (Signed)
Dr. Olevia Bowens states pt is 9.5 dilated.

## 2014-06-05 NOTE — ED Notes (Signed)
Pt currently having another contraction

## 2014-06-05 NOTE — Progress Notes (Signed)
PSYCHOSOCIAL ASSESSMENT ~ MATERNAL/CHILD Name: Deborah Melendez not yet named                                                                                                          Age:   Referral Date: 06/05/14 Reason/Source: NICU  I. FAMILY/HOME ENVIRONMENT A. Child's Legal Guardian __X__Parent(s) ___Grandparent ___Foster parent ___DSS_________________ Name: Deborah Melendez                                 DOB: 06-Aug-1976                     Age: 38  Address: [address] Deborah Melendez, [place], [place] [address]  Name: Deborah Melendez                                      DOB: //                     Age:   Address: same  B. Other Household Members/Support Persons Name:                                         Relationship:                        DOB ___/___/___                   Name:                                         Relationship:                        DOB ___/___/___                   Name:                                         Relationship:                        DOB ___/___/___                   Name:                                         Relationship:                        DOB ___/___/___  C. Other Support:   II. PSYCHOSOCIAL DATA A. Information Source                                                                                             _X_Patient Interview  __Family Interview           __Other___________  B. Financial and Community Resources-unknown __Employment: __Medicaid    County:                 __Private Insurance:                   __Self Pay  __Food Stamps   __WIC __Work First     __Public Housing     __Section 8    __Maternity Care Coordination/Child Service Coordination/Early Intervention   ___School:                                                                         Grade:  __Other:   [name] Cultural and Environment Information Cultural Issues Impacting Care: None stated  III. STRENGTHS-unknown ___Supportive family/friends ___Adequate Resources ___Compliance with  medical plan ___Home prepared for Child (including basic supplies) ___Understanding of illness      ___Other: IV. RISK FACTORS AND CURRENT PROBLEMS         ____No Problems Noted                                                                                                                                                                                                                                               Pt              Family  Substance Abuse                                                                   _x_              ___               Mental Illness                                                                        ___              ___  Family/Relationship Issues                                      ___               ___             Abuse/Neglect/Domestic Violence                                         ___         ___  Financial Resources                                        ___              ___             Transportation                                                                        ___               ___  DSS Involvement                                                                   ___              ___  Adjustment to Illness  ___              ___  Knowledge/Cognitive Deficit                                                   ___              ___             Compliance with Treatment                                                 ___              ___  Basic Needs (food, housing, etc.)                                          ___              ___             Housing Concerns                                       ___              ___ Other_____________________________________________________________            V. SOCIAL WORK ASSESSMENT  CSW met with MOB in her third floor room to introduce myself, offer support and complete assessment due to NICU admission of baby at approximately 29 weeks,  NPNC and substance use.  MOB's UDS was positive for Cocaine and THC on admission and baby's UDS is positive for Cocaine.  MOB was initially welcoming and communicative.  She states she "didn't really know" she was pregnant and states she did not receive prenatal care.  She reports having 3 other children, ages 73, 97 and 3 who have gone to stay with her mother in [place] for the summer.  She states they left about a month ago.  CSW asked if they were already out of school a month ago and MOB states that they were.  She states her children stay with her typically and are just away for the summer.  She states FOB is involved and that they live together.  She reports he has been here with her.  CSW asked how she is feeling at this time regarding giving prematurely.  MOB states her last child was born prematurely (she thinks at approximately 16 weeks) and spent 4-5 months in the NICU in [place].  She states that child was born in [place], [place] and transferred to [place].  She states this is the first baby she has delivered in [place] and that she has lived here approximately 1 year.  MOB picked up the phone to order her lunch while CSW was speaking with her.  CSW asked if she wanted CSW to wait while she ordered her lunch, or if we could finish speaking before she called dietary.  She asked if there was anything else we needed to speak about.  CSW asked  if anyone had informed her that her drug screen had been positive.  She stated "yes" and that she "snorts cocaine sometimes."  She reports marijuana use on occasion and estimates her last use a month ago.  She could not estimate when her last cocaine use was.  She states she "usually does pretty good."  CSW asked if she has ever had involvement with Child Protective Services.  She states she used to have a "case worker that came to the house" but that it has been "years" and that she does not know this person's name.  CSW informed her that drug screen will be completed  on the baby and that due to the current substance use, CPS will be involved.  MOB became agitated and asked that her daughter go home with FOB.  CSW stated that CSW does not make these decisions and that a CPS worker will be talking with her.  MOB reports he does not use drugs.  CSW asked if MOB is interested in substance about treatment and she states she is unsure at this time.  She informed CSW that she did not want to talk about this any further and picked up the phone to order her lunch.  CSW wrote contact information on MOB's white board in her room.  CSW made report to [place] as baby's UDS was also positive for Cocaine.  CSW will continue to follow.  VI. SOCIAL WORK PLAN  ___No Further Intervention Required/No Barriers to Discharge   __X__Psychosocial Support and Ongoing Assessment of Needs   ___Patient/Family Education:   __X__Child Protective Services Report   County_[place]_ Date_6_/_9_/_15_   __X__Information/Referral to [employer] will assist with making referrals to substance abuse treatment programs if MOB is agreeable.    __X__Other: CSW will monitor MDS result.

## 2014-06-05 NOTE — ED Notes (Signed)
Water broken by Dr. Olevia Bowens with clear fluid

## 2014-06-05 NOTE — ED Notes (Signed)
Pt having another contraction

## 2014-06-05 NOTE — H&P (Signed)
LABOR ADMISSION HISTORY AND PHYSICAL  Deborah Melendez is a 38 y.o. female G7P5 with IUP at Unknown presenting for active labor to the [facility] ED.   States started bleeding and having contractions earlier this evening and they have been worsening.  No LOF.  +FM. She has had no care and doesn't know how far along she is.  Pt was 9 cm at time of arrival.    Prenatal History/Complications:  Past Medical History: Past Medical History  Diagnosis Date  . MRSA (methicillin resistant Staphylococcus aureus)     surgery on finger 3 years ago    Past Surgical History: Past Surgical History  Procedure Laterality Date  . Finger surgery      Obstetrical History: OB History   Grav Para Term Preterm Abortions TAB SAB Ect Mult Living   7 5        4    2  SABs 4 preterm deliveries (doesn't know how many weeks)    Social History: History   Social History  . Marital Status: Single    Spouse Name: N/A    Number of Children: N/A  . Years of Education: N/A   Social History Main Topics  . Smoking status: Current Every Day Smoker -- 0.50 packs/day    Types: Cigarettes  . Smokeless tobacco: None  . Alcohol Use: No  . Drug Use: Yes    Special: Methamphetamines, Cocaine     Comment: Last used cocaine 2 weeks ago.  Marland Kitchen Sexual Activity: None   Other Topics Concern  . None   Social History Narrative  . None    Family History: Family History  Problem Relation Age of Onset  . Diabetes Mother   . Hypertension Mother     Allergies: No Known Allergies  Prescriptions prior to admission  Medication Sig Dispense Refill  . clotrimazole (LOTRIMIN) 1 % cream Apply to all toenails 2 times daily until symptoms cleared  15 g  0  . diphenhydrAMINE (BENADRYL) 25 MG tablet Take 50 mg by mouth every 6 (six) hours as needed. Skin rashes      . ibuprofen (ADVIL,MOTRIN) 600 MG tablet Take 1 tablet (600 mg total) by mouth every 6 (six) hours as needed for pain.  30 tablet  0     Review of Systems    All systems reviewed and negative except as stated in HPI  Blood pressure 123/99, pulse 94, temperature 98.8 F (37.1 C), temperature source Oral, resp. rate 14, SpO2 100.00%, unknown if currently breastfeeding. General appearance: alert, mild distress and uncooperative Lungs: clear to auscultation bilaterally Heart: regular rate and rhythm Abdomen: soft, non-tender; bowel sounds normal Extremities: Homans sign is negative, no sign of DVT  Presentation: cephalic Fetal monitoringBaseline: 150 bpm, Variability: Good {> 6 bpm), Accelerations: Reactive and Decelerations: Absent Uterine activity contractions every 5-6 min      Prenatal labs: ABO, Rh: --/--/O POS (06/08 2333) Antibody:   Rubella:   RPR:    HBsAg:    HIV:    GBS:       Results for orders placed during the hospital encounter of 06/04/14 (from the past 24 hour(s))  CBC   Collection Time    06/04/14 11:30 PM      Result Value Ref Range   WBC 13.5 (*) 4.0 - 10.5 K/uL   RBC 3.60 (*) 3.87 - 5.11 MIL/uL   Hemoglobin 11.3 (*) 12.0 - 15.0 g/dL   HCT 32.7 (*) 36.0 - 46.0 %   MCV 90.8  78.0 - 100.0 fL   MCH 31.4  26.0 - 34.0 pg   MCHC 34.6  30.0 - 36.0 g/dL   RDW 13.8  11.5 - 15.5 %   Platelets 183  150 - 400 K/uL  ABO/RH   Collection Time    06/04/14 11:33 PM      Result Value Ref Range   ABO/RH(D) O POS     No rh immune globuloin NOT A RH IMMUNE GLOBULIN CANDIDATE, PT RH POSITIVE    I-STAT CHEM 8, ED   Collection Time    06/04/14 11:47 PM      Result Value Ref Range   Sodium 138  137 - 147 mEq/L   Potassium 3.5 (*) 3.7 - 5.3 mEq/L   Chloride 102  96 - 112 mEq/L   BUN 11  6 - 23 mg/dL   Creatinine, Ser 0.80  0.50 - 1.10 mg/dL   Glucose, Bld 103 (*) 70 - 99 mg/dL   Calcium, Ion 1.19  1.12 - 1.23 mmol/L   TCO2 19  0 - 100 mmol/L   Hemoglobin 11.9 (*) 12.0 - 15.0 g/dL   HCT 35.0 (*) 36.0 - 46.0 %  RAPID HIV SCREEN ([facility])   Collection Time    06/05/14 12:11 AM      Result Value Ref Range   SUDS Rapid  HIV Screen NON REACTIVE  NON REACTIVE  URINALYSIS, ROUTINE W REFLEX MICROSCOPIC   Collection Time    06/05/14  1:26 AM      Result Value Ref Range   Color, Urine YELLOW  YELLOW   APPearance CLEAR  CLEAR   Specific Gravity, Urine 1.021  1.005 - 1.030   pH 6.0  5.0 - 8.0   Glucose, UA NEGATIVE  NEGATIVE mg/dL   Hgb urine dipstick NEGATIVE  NEGATIVE   Bilirubin Urine NEGATIVE  NEGATIVE   Ketones, ur NEGATIVE  NEGATIVE mg/dL   Protein, ur NEGATIVE  NEGATIVE mg/dL   Urobilinogen, UA 1.0  0.0 - 1.0 mg/dL   Nitrite NEGATIVE  NEGATIVE   Leukocytes, UA MODERATE (*) NEGATIVE  URINE RAPID DRUG SCREEN (HOSP PERFORMED)   Collection Time    06/05/14  1:26 AM      Result Value Ref Range   Opiates NONE DETECTED  NONE DETECTED   Cocaine POSITIVE (*) NONE DETECTED   Benzodiazepines NONE DETECTED  NONE DETECTED   Amphetamines NONE DETECTED  NONE DETECTED   Tetrahydrocannabinol POSITIVE (*) NONE DETECTED   Barbiturates NONE DETECTED  NONE DETECTED  URINE MICROSCOPIC-ADD ON   Collection Time    06/05/14  1:26 AM      Result Value Ref Range   Squamous Epithelial / LPF FEW (*) RARE   WBC, UA 7-10  <3 WBC/hpf   RBC / HPF 0-2  <3 RBC/hpf   Bacteria, UA FEW (*) RARE   Urine-Other MUCOUS PRESENT      Assessment: Deborah Melendez is a 38 y.o. G7P5 at Unknown gestation but with a fundal height of 28cm here for preterm labor at 9cm.  She was given BMZ x1 approx 1 hour prior to delivery.  Delivery occurred in the [facility] ED as outlined in the delivery note.  She will be transferred to [facility] hospital for postpartum care.    - UDS + for cocaine and THC. Will need SW consult.   - obtain prenatal labs - pt would like BTL. States has "Obama Care". Will need to investigate in the AM.   - baby to NICU -  routine postpartum orders.    Deborah Melendez 06/05/2014, 2:12 AM

## 2014-06-05 NOTE — Consult Note (Signed)
Baby warmer at bedside and preheating

## 2014-06-05 NOTE — Lactation Note (Signed)
This note was copied from the chart of Deborah Cayci Mcnabb. Lactation Consultation Note     Mother's choice to formula feed. Mom and baby urine screens both positive for cocaine.  Patient Name: Deborah Melendez Date: 06/05/2014 Reason for consult: Other (Comment) (formula exclusion)   Maternal Data Formula Feeding for Exclusion: Yes Reason for exclusion: Mother's choice to formula feed on admision Infant to breast within first hour of birth: No  Feeding    LATCH Score/Interventions                      Lactation Tools Discussed/Used     Consult Status Consult Status: Complete    Tonna Corner 06/05/2014, 6:49 PM

## 2014-06-05 NOTE — Progress Notes (Signed)
CPS case has been assigned to S. [phone].

## 2014-06-05 NOTE — Progress Notes (Signed)
UR completed 

## 2014-06-05 NOTE — ED Notes (Signed)
NICU team at bedside

## 2014-06-05 NOTE — ED Notes (Signed)
Baby placed on 4 lpm [place]

## 2014-06-06 DIAGNOSIS — Z30017 Encounter for initial prescription of implantable subdermal contraceptive: Secondary | ICD-10-CM

## 2014-06-06 LAB — RUBELLA SCREEN
RUBELLA: 3.25 {index} — AB (ref <0.90)
Rubella: 3.44 Index — ABNORMAL HIGH (ref <0.90)

## 2014-06-06 NOTE — Progress Notes (Signed)
CPS worker/Stephanie Ijames has met with MOB and initiated the case.  CSW will be in touch with CPS worker as information is gathered and plan is made for baby's disposition.

## 2014-06-06 NOTE — Discharge Summary (Signed)
Obstetric Discharge Summary Reason for Admission: onset of labor Prenatal Procedures: none Intrapartum Procedures: spontaneous vaginal delivery Postpartum Procedures: insertion Nexplanon on PPD #1 Complications-Operative and Postpartum: none  Hospital Course:  Delivery Note  At 1:00 AM a viable female was delivered via Vaginal, Spontaneous Delivery (Presentation: ROA). APGAR: 8, ; weight TBD.  Placenta status: Intact, Spontaneous. Cord: 3 vessels with the following complications: likely terminal abruption.  Anesthesia: None  Episiotomy: None  Lacerations: none  Suture Repair: na  Est. Blood Loss (mL): 700  Mom to postpartum. Baby to NICU.  Called to [facility] ED for a [id number] with unknown GA and no PNC who presented in labor with bleeding. AROM performed once nicu arrived. Pt was uncooperative and moving around in the bed and unwilling to push for an extended period of time. She then started bleeding with approx 500cc of blood coming out onto the bed over the course of the next 10 min. FHT remained reassuring. An anterior lip was pushed back without difficulty. Finally pt chose to push and with 2 pushes delivered a liveborn female via NSVD with spontaneous cry. Cord cut and clamped quickly and baby taken to warmer with NICU. Placenta delivered intact with 3V cord via traction and pitocin. no tear. No complications. Mom and baby to be transported to [facility] for further care. Baby to NICU.    Patient postpartum course uneventful, lochia appropriate, tol diet/ambulation, voiding. Discussed that we need insurance information prior to being able to schedule BTL, patient elects instead for Nexplanon which was placed PPD#1 by Dr. Leslie Andrea. Instructed to follow in clinic for routine postpartum care and further address BTL if desired in the future. Patient stable medically for discharge.   Reviewed social work notes: CPS has met with MOB and initiated their case. Will follow.    H/H: Lab  Results  Component Value Date/Time   HGB 11.9* 06/04/2014 11:47 PM   HCT 35.0* 06/04/2014 11:47 PM    Filed Vitals:   06/06/14 1205  BP: 117/71  Pulse: 86  Temp: 97.6 F (36.4 C)  Resp: 18    Physical Exam: VSS NAD  Abd: Appropriately tender, ND, Fundus firm  Incision: none No c/c/e Neg homan's sign bilaterally Lochia Appropriate  Discharge Diagnoses: Premature labor and UDS pos Cocaine, THC  Discharge Information: Date: 07/09/2011 Activity: pelvic rest Diet: routine  Medications: Ibuprofen Breast feeding:  No:  Condition: stable Instructions: refer to handout Discharge to: home      Medication List         clotrimazole 1 % cream  Commonly known as:  LOTRIMIN  Apply to all toenails 2 times daily until symptoms cleared     diphenhydrAMINE 25 MG tablet  Commonly known as:  BENADRYL  Take 50 mg by mouth every 6 (six) hours as needed. Skin rashes     ibuprofen 600 MG tablet  Commonly known as:  ADVIL,MOTRIN  Take 1 tablet (600 mg total) by mouth every 6 (six) hours as needed for pain.           Follow-up Information   Follow up with [facility]. Schedule an appointment as soon as possible for a visit in 1 week. (postpartum visit, address BTL)    Contact information:   [place] [address] [phone]      Emeterio Reeve 06/06/2014,2:53 PM  I spoke with and examined patient and agree with resident's note and plan of care.  Fredrik Rigger, MD OB Fellow 06/07/2014  9:38 AM

## 2014-06-06 NOTE — Procedures (Signed)
Deborah Melendez is a 38 y.o. year old African American female here for Nexplanon insertion.  PPD#2-  Risks/benefits/side effects of Nexplanon have been discussed and her questions have been answered.  Specifically, a failure rate of 12/998 has been reported, with an increased failure rate if pt takes Mayer and/or antiseizure medicaitons.  Deborah Melendez is aware of the common side effect of irregular bleeding, which the incidence of decreases over time.  Her left arm, approximatly 4 inches proximal from the elbow, was cleansed with alcohol and anesthetized with 2cc of 2% Lidocaine.  The area was cleansed again and the Nexplanon was inserted without difficulty.  A pressure bandage was applied.  Pt was instructed to remove pressure bandage in a few hours, and keep insertion site covered with a bandaid for 3 days.   Follow-up scheduled PRN problems  Deborah Melendez RYAN 06/06/2014 1:01 PM

## 2014-06-06 NOTE — Progress Notes (Signed)
Discharged instructions reviewed with patient, verbalized understanding of instructions.  Patient waiting for ride home.

## 2014-06-06 NOTE — Progress Notes (Addendum)
Discharged home with significant other, ambulatory to NICU.  No c/o.

## 2014-06-06 NOTE — H&P (Signed)
Attestation of Attending Supervision of Advanced Practitioner (CNM/NP): Evaluation and management procedures were performed by the Advanced Practitioner under my supervision and collaboration.  I have reviewed the Advanced Practitioner's note and chart, and I agree with the management and plan.  Haron Beilke 06/06/2014 3:53 PM

## 2014-07-06 ENCOUNTER — Encounter: Payer: Self-pay | Admitting: Obstetrics and Gynecology

## 2014-07-06 ENCOUNTER — Ambulatory Visit (INDEPENDENT_AMBULATORY_CARE_PROVIDER_SITE_OTHER): Payer: Self-pay | Admitting: Obstetrics and Gynecology

## 2014-07-06 DIAGNOSIS — F17290 Nicotine dependence, other tobacco product, uncomplicated: Secondary | ICD-10-CM

## 2014-07-06 DIAGNOSIS — F1910 Other psychoactive substance abuse, uncomplicated: Secondary | ICD-10-CM

## 2014-07-06 DIAGNOSIS — F17200 Nicotine dependence, unspecified, uncomplicated: Secondary | ICD-10-CM

## 2014-07-06 MED ORDER — VARENICLINE TARTRATE 0.5 MG PO TABS: 0.5000 mg | ORAL_TABLET | Freq: Two times a day (BID) | ORAL | Status: DC

## 2014-07-06 NOTE — Progress Notes (Signed)
  Subjective:     Deborah Melendez is a 38 y.o. female [id number] who presents for a postpartum visit. She is 5 weeks postpartum following a spontaneous vaginal delivery with NPC, substance abuse. I have fully reviewed the prenatal and intrapartum course. The delivery was at approx 32 gestational weeks (weight 3+). Outcome: spontaneous vaginal delivery. Anesthesia: epidural. Postpartum course has been complicated by . Baby's course has been NICU for PTB and +cocaine . Plan D/C home today with FOB. Baby is feeding by bottle - Similac Advance and Similac with Iron. Bleeding no bleeding. Bowel function is normal. Bladder function is normal. Patient is sexually active. Contraception method is Nexplanon. Inserted PP prior to D/C.  Postpartum depression screening: positive, score 13. Affect appropriate. No SI/HI. Marland Kitchen Long discussion re sadness re to not taking home baby and still using cocaine. Has appt with MH today set up by SW. Plans inpt rehab in 2-3 wks. Wants to quit smoking and wants Chantrix.     Review of Systems Pertinent items in HPI.  Objective:    BP 108/76  Pulse 86  Ht 5\' 2"  (1.575 m)  Wt 148 lb 11.2 oz (67.45 kg)  BMI 27.19 kg/m2  Breastfeeding? No  General:  alert, cooperative and no distress   Breasts:  negative  Lungs: clear to auscultation bilaterally  Heart:  regular rate and rhythm, S1, S2 normal, no murmur, click, rub or gallop                             Assessment:     5 wks postpartum exam. [facility]) Substance abuse Mild PP depression> counselor appt today  Plan:    1. Contraception: Nexplanon 2. Rx Chantrix 3. Follow up in: 1 year (ASAP for Pap at [facility]) or as needed.

## 2014-07-06 NOTE — Patient Instructions (Signed)
Smoking Cessation Quitting smoking is important to your health and has many advantages. However, it is not always easy to quit since nicotine is a very addictive drug. Often times, people try 3 times or more before being able to quit. This document explains the best ways for you to prepare to quit smoking. Quitting takes hard work and a lot of effort, but you can do it. ADVANTAGES OF QUITTING SMOKING  You will live longer, feel better, and live better.  Your body will feel the impact of quitting smoking almost immediately.  Within 20 minutes, blood pressure decreases. Your pulse returns to its normal level.  After 8 hours, carbon monoxide levels in the blood return to normal. Your oxygen level increases.  After 24 hours, the chance of having a heart attack starts to decrease. Your breath, hair, and body stop smelling like smoke.  After 48 hours, damaged nerve endings begin to recover. Your sense of taste and smell improve.  After 72 hours, the body is virtually free of nicotine. Your bronchial tubes relax and breathing becomes easier.  After 2 to 12 weeks, lungs can hold more air. Exercise becomes easier and circulation improves.  The risk of having a heart attack, stroke, cancer, or lung disease is greatly reduced.  After 1 year, the risk of coronary heart disease is cut in half.  After 5 years, the risk of stroke falls to the same as a nonsmoker.  After 10 years, the risk of lung cancer is cut in half and the risk of other cancers decreases significantly.  After 15 years, the risk of coronary heart disease drops, usually to the level of a nonsmoker.  If you are pregnant, quitting smoking will improve your chances of having a healthy baby.  The people you live with, especially any children, will be healthier.  You will have extra money to spend on things other than cigarettes. QUESTIONS TO THINK ABOUT BEFORE ATTEMPTING TO QUIT You may want to talk about your answers with your  caregiver.  Why do you want to quit?  If you tried to quit in the past, what helped and what did not?  What will be the most difficult situations for you after you quit? How will you plan to handle them?  Who can help you through the tough times? Your family? Friends? A caregiver?  What pleasures do you get from smoking? What ways can you still get pleasure if you quit? Here are some questions to ask your caregiver:  How can you help me to be successful at quitting?  What medicine do you think would be best for me and how should I take it?  What should I do if I need more help?  What is smoking withdrawal like? How can I get information on withdrawal? GET READY  Set a quit date.  Change your environment by getting rid of all cigarettes, ashtrays, matches, and lighters in your home, car, or work. Do not let people smoke in your home.  Review your past attempts to quit. Think about what worked and what did not. GET SUPPORT AND ENCOURAGEMENT You have a better chance of being successful if you have help. You can get support in many ways.  Tell your family, friends, and co-workers that you are going to quit and need their support. Ask them not to smoke around you.  Get individual, group, or telephone counseling and support. Programs are available at local hospitals and health centers. Call your local health department for   information about programs in your area.  Spiritual beliefs and practices may help some smokers quit.  Download a "quit meter" on your computer to keep track of quit statistics, such as how long you have gone without smoking, cigarettes not smoked, and money saved.  Get a self-help book about quitting smoking and staying off of tobacco. LEARN NEW SKILLS AND BEHAVIORS  Distract yourself from urges to smoke. Talk to someone, go for a walk, or occupy your time with a task.  Change your normal routine. Take a different route to work. Drink tea instead of coffee.  Eat breakfast in a different place.  Reduce your stress. Take a hot bath, exercise, or read a book.  Plan something enjoyable to do every day. Reward yourself for not smoking.  Explore interactive web-based programs that specialize in helping you quit. GET MEDICINE AND USE IT CORRECTLY Medicines can help you stop smoking and decrease the urge to smoke. Combining medicine with the above behavioral methods and support can greatly increase your chances of successfully quitting smoking.  Nicotine replacement therapy helps deliver nicotine to your body without the negative effects and risks of smoking. Nicotine replacement therapy includes nicotine gum, lozenges, inhalers, nasal sprays, and skin patches. Some may be available over-the-counter and others require a prescription.  Antidepressant medicine helps people abstain from smoking, but how this works is unknown. This medicine is available by prescription.  Nicotinic receptor partial agonist medicine simulates the effect of nicotine in your brain. This medicine is available by prescription. Ask your caregiver for advice about which medicines to use and how to use them based on your health history. Your caregiver will tell you what side effects to look out for if you choose to be on a medicine or therapy. Carefully read the information on the package. Do not use any other product containing nicotine while using a nicotine replacement product.  RELAPSE OR DIFFICULT SITUATIONS Most relapses occur within the first 3 months after quitting. Do not be discouraged if you start smoking again. Remember, most people try several times before finally quitting. You may have symptoms of withdrawal because your body is used to nicotine. You may crave cigarettes, be irritable, feel very hungry, cough often, get headaches, or have difficulty concentrating. The withdrawal symptoms are only temporary. They are strongest when you first quit, but they will go away within  10-14 days. To reduce the chances of relapse, try to:  Avoid drinking alcohol. Drinking lowers your chances of successfully quitting.  Reduce the amount of caffeine you consume. Once you quit smoking, the amount of caffeine in your body increases and can give you symptoms, such as a rapid heartbeat, sweating, and anxiety.  Avoid smokers because they can make you want to smoke.  Do not let weight gain distract you. Many smokers will gain weight when they quit, usually less than 10 pounds. Eat a healthy diet and stay active. You can always lose the weight gained after you quit.  Find ways to improve your mood other than smoking. FOR MORE INFORMATION  www.smokefree.gov  Document Released: 12/08/2001 Document Revised: 06/14/2012 Document Reviewed: 03/24/2012 [employer] Patient Information 2015 [employer]. This information is not intended to replace advice given to you by your health care provider. Make sure you discuss any questions you have with your health care provider.  

## 2014-07-18 ENCOUNTER — Emergency Department (HOSPITAL_BASED_OUTPATIENT_CLINIC_OR_DEPARTMENT_OTHER)
Admission: EM | Admit: 2014-07-18 | Discharge: 2014-07-18 | Disposition: A | Discharge status: Home / Self Care | Payer: Medicaid Other | Attending: Emergency Medicine | Admitting: Emergency Medicine

## 2014-07-18 ENCOUNTER — Encounter (HOSPITAL_BASED_OUTPATIENT_CLINIC_OR_DEPARTMENT_OTHER): Payer: Self-pay | Admitting: Emergency Medicine

## 2014-07-18 DIAGNOSIS — F17200 Nicotine dependence, unspecified, uncomplicated: Secondary | ICD-10-CM | POA: Insufficient documentation

## 2014-07-18 DIAGNOSIS — IMO0001 Reserved for inherently not codable concepts without codable children: Secondary | ICD-10-CM | POA: Diagnosis not present

## 2014-07-18 DIAGNOSIS — S1096XA Insect bite of unspecified part of neck, initial encounter: Secondary | ICD-10-CM | POA: Diagnosis not present

## 2014-07-18 DIAGNOSIS — Y929 Unspecified place or not applicable: Secondary | ICD-10-CM | POA: Insufficient documentation

## 2014-07-18 DIAGNOSIS — Z8614 Personal history of Methicillin resistant Staphylococcus aureus infection: Secondary | ICD-10-CM | POA: Insufficient documentation

## 2014-07-18 DIAGNOSIS — Z79899 Other long term (current) drug therapy: Secondary | ICD-10-CM | POA: Insufficient documentation

## 2014-07-18 DIAGNOSIS — Y939 Activity, unspecified: Secondary | ICD-10-CM | POA: Insufficient documentation

## 2014-07-18 DIAGNOSIS — W57XXXA Bitten or stung by nonvenomous insect and other nonvenomous arthropods, initial encounter: Secondary | ICD-10-CM | POA: Insufficient documentation

## 2014-07-18 MED ORDER — HYDROXYZINE HCL 25 MG PO TABS: 25.0000 mg | ORAL_TABLET | Freq: Four times a day (QID) | ORAL | Status: DC

## 2014-07-18 NOTE — Discharge Instructions (Signed)
Atarax as needed for itching.  Return to the emergency department for difficulty breathing, throat swelling, worsening symptoms, or other new and concerning symptoms.   Bedbugs Bedbugs are tiny bugs that live in and around beds. During the day, they hide in mattresses and other places near beds. They come out at night and bite people lying in bed. They need blood to live and grow. Bedbugs can be found in beds anywhere. Usually, they are found in places where many people come and go (hotels, shelters, hospitals). It does not matter whether the place is dirty or clean. Getting bitten by bedbugs rarely causes a medical problem. The biggest problem can be getting rid of them. This often takes the work of a Financial risk analyst. CAUSES  Less use of pesticides. Bedbugs were common before the 1950s. Then, strong pesticides such as DDT nearly wiped them out. Today, these pesticides are not used because they harm the environment and can cause health problems.  More travel. Besides mattresses, bedbugs can also live in clothing and luggage. They can come along as people travel from place to place. Bedbugs are more common in certain parts of the world. When people travel to those areas, the bugs can come home with them.  Presence of birds and bats. Bedbugs often infest birds and bats. If you have these animals in or near your home, bedbugs may infest your house, too. SYMPTOMS It does not hurt to be bitten by a bedbug. You will probably not wake up when you are bitten. Bedbugs usually bite areas of the skin that are not covered. Symptoms may show when you wake up, or they may take a day or more to show up. Symptoms may include:  Small red bumps on the skin. These might be lined up in a row or clustered in a group.  A darker red dot in the middle of red bumps.  Blisters on the skin. There may be swelling and very bad itching. These may be signs of an allergic reaction. This does not happen  often. DIAGNOSIS Bedbug bites might look and feel like other types of insect bites. The bugs do not stay on the body like ticks or lice. They bite, drop off, and crawl away to hide. Your caregiver will probably:  Ask about your symptoms.  Ask about your recent activities and travel.  Check your skin for bedbug bites.  Ask you to check at home for signs of bedbugs. You should look for:  Spots or stains on the bed or nearby. This could be from bedbugs that were crushed or from their eggs or waste.  Bedbugs themselves. They are reddish-brown, oval, and flat. They do not fly. They are about the size of an apple seed.  Places to look for bedbugs include:  Beds. Check mattresses, headboards, box springs, and bed frames.  On drapes and curtains near the bed.  Under carpeting in the bedroom.  Behind electrical outlets.  Behind any wallpaper that is peeling.  Inside luggage. TREATMENT Most bedbug bites do not need treatment. They usually go away on their own in a few days. The bites are not dangerous. However, treatment may be needed if you have scratched so much that your skin has become infected. You may also need treatment if you are allergic to bedbug bites. Treatment options include:  A drug that stops swelling and itching (corticosteroid). Usually, a cream is rubbed on the skin. If you have a bad rash, you may be given a corticosteroid  pill.  Oral antihistamines. These are pills to help control itching.  Antibiotic medicines. An antibiotic may be prescribed for infected skin. HOME CARE INSTRUCTIONS   Take any medicine prescribed by your caregiver for your bites. Follow the directions carefully.  Consider wearing pajamas with long sleeves and pant legs.  Your bedroom may need to be treated. A pest control expert should make sure the bedbugs are gone. You may need to throw away mattresses or luggage. Ask the pest control expert what you can do to keep the bedbugs from coming  back. Common suggestions include:  Putting a plastic cover over your mattress.  Washing and drying your clothes and bedding in hot water and a hot dryer. The temperature should be hotter than 120 F (48.9 C). Bedbugs are killed by high temperatures.  Vacuuming carefully all around your bed. Vacuum in all cracks and crevices where the bugs might hide. Do this often.  Carefully checking all used furniture, bedding, or clothes that you bring into your house.  Eliminating bird nests and bat roosts.  If you get bedbug bites when traveling, check all your possessions carefully before bringing them into your house. If you find any bugs on clothes or in your luggage, consider throwing those items away. SEEK MEDICAL CARE IF:  You have red bug bites that keep coming back.  You have red bug bites that itch badly.  You have bug bites that cause a skin rash.  You have scratch marks that are red and sore. SEEK IMMEDIATE MEDICAL CARE IF: You have a fever. Document Released: 01/16/2011 Document Revised: 03/07/2012 Document Reviewed: 01/16/2011 [facility] Patient Information 2015 [place], [place]. This information is not intended to replace advice given to you by your health care provider. Make sure you discuss any questions you have with your health care provider.

## 2014-07-18 NOTE — ED Notes (Signed)
C/o bug bites to face, hands, and ankles. States bugs were crawling on bed she slept on. C/o itching.

## 2014-07-18 NOTE — ED Provider Notes (Signed)
CSN: [id number]     Arrival date & time 07/18/14  1041 History   First MD Initiated Contact with Patient 07/18/14 1052     Chief Complaint  Patient presents with  . Insect Bite     (Consider location/radiation/quality/duration/timing/severity/associated sxs/prior Treatment) HPI Comments: Patient is a 38 year old female who presents with complaints of bug bites to the arms face and neck. [facility] and [facility] sleeping there. He is concerned that something bit her in the night. She denies any other new contacts or exposures. She denies any fevers or chills.  The history is provided by the patient.    Past Medical History  Diagnosis Date  . MRSA (methicillin resistant Staphylococcus aureus)     surgery on finger 3 years ago   Past Surgical History  Procedure Laterality Date  . Finger surgery     Family History  Problem Relation Age of Onset  . Diabetes Mother   . Hypertension Mother    History  Substance Use Topics  . Smoking status: Current Every Day Smoker -- 0.50 packs/day    Types: Cigarettes  . Smokeless tobacco: Not on file  . Alcohol Use: No   OB History   Grav Para Term Preterm Abortions TAB SAB Ect Mult Living   7 5        5      Review of Systems  All other systems reviewed and are negative.     Allergies  Review of patient's allergies indicates no known allergies.  Home Medications   Prior to Admission medications   Medication Sig Start Date End Date Taking? Authorizing Provider  clotrimazole (LOTRIMIN) 1 % cream Apply to all toenails 2 times daily until symptoms cleared 04/14/13   Domenic Moras, PA-C  diphenhydrAMINE (BENADRYL) 25 MG tablet Take 50 mg by mouth every 6 (six) hours as needed. Skin rashes    Historical Provider, MD  ibuprofen (ADVIL,MOTRIN) 600 MG tablet Take 1 tablet (600 mg total) by mouth every 6 (six) hours as needed for pain. 04/14/13   Domenic Moras, PA-C  varenicline (CHANTIX) 0.5 MG tablet Take  1 tablet (0.5 mg total) by mouth 2 (two) times daily. 07/06/14   Deirdre Freida Busman, CNM   BP 121/84  Pulse 69  Temp(Src) 98.2 F (36.8 C) (Oral)  Resp 18  SpO2 100%  LMP 07/13/2014 Physical Exam  Nursing note and vitals reviewed. Constitutional: She is oriented to person, place, and time. She appears well-developed and well-nourished. No distress.  HENT:  Head: Normocephalic and atraumatic.  Neck: Normal range of motion. Neck supple.  Musculoskeletal: Normal range of motion.  Neurological: She is alert and oriented to person, place, and time.  Skin: Skin is warm and dry. She is not diaphoretic.  There are numerous red, raised, blanching, pruritic areas to the forearms, dorsum of the hands, face, and neck.    ED Course  Procedures (including critical care time) Labs Review Labs Reviewed - No data to display  Imaging Review No results found.   EKG Interpretation None      MDM   Final diagnoses:  None    These appear to be insect bites or some other form of contact dermatitis. We'll treat with hydroxyzine for the itching and when necessary followup.    Veryl Speak, MD 07/18/14 1102

## 2014-10-29 ENCOUNTER — Encounter (HOSPITAL_BASED_OUTPATIENT_CLINIC_OR_DEPARTMENT_OTHER): Payer: Self-pay | Admitting: Emergency Medicine

## 2015-07-14 ENCOUNTER — Encounter (HOSPITAL_COMMUNITY): Payer: Self-pay | Admitting: Emergency Medicine

## 2015-07-14 ENCOUNTER — Emergency Department (HOSPITAL_COMMUNITY)
Admission: EM | Admit: 2015-07-14 | Discharge: 2015-07-15 | Disposition: A | Discharge status: Home / Self Care | Payer: Medicaid Other | Attending: Emergency Medicine | Admitting: Emergency Medicine

## 2015-07-14 DIAGNOSIS — N739 Female pelvic inflammatory disease, unspecified: Secondary | ICD-10-CM | POA: Diagnosis not present

## 2015-07-14 DIAGNOSIS — Z79899 Other long term (current) drug therapy: Secondary | ICD-10-CM | POA: Diagnosis not present

## 2015-07-14 DIAGNOSIS — R102 Pelvic and perineal pain: Secondary | ICD-10-CM

## 2015-07-14 DIAGNOSIS — B9689 Other specified bacterial agents as the cause of diseases classified elsewhere: Secondary | ICD-10-CM

## 2015-07-14 DIAGNOSIS — Z8614 Personal history of Methicillin resistant Staphylococcus aureus infection: Secondary | ICD-10-CM | POA: Diagnosis not present

## 2015-07-14 DIAGNOSIS — N760 Acute vaginitis: Secondary | ICD-10-CM | POA: Diagnosis not present

## 2015-07-14 DIAGNOSIS — Z720 Tobacco use: Secondary | ICD-10-CM | POA: Diagnosis not present

## 2015-07-14 DIAGNOSIS — R509 Fever, unspecified: Secondary | ICD-10-CM | POA: Diagnosis not present

## 2015-07-14 DIAGNOSIS — Z8619 Personal history of other infectious and parasitic diseases: Secondary | ICD-10-CM | POA: Diagnosis not present

## 2015-07-14 DIAGNOSIS — R1030 Lower abdominal pain, unspecified: Secondary | ICD-10-CM | POA: Diagnosis present

## 2015-07-14 DIAGNOSIS — R197 Diarrhea, unspecified: Secondary | ICD-10-CM | POA: Insufficient documentation

## 2015-07-14 DIAGNOSIS — N730 Acute parametritis and pelvic cellulitis: Secondary | ICD-10-CM

## 2015-07-14 LAB — CBC
HEMATOCRIT: 37.4 % (ref 36.0–46.0)
Hemoglobin: 12.6 g/dL (ref 12.0–15.0)
MCH: 29.7 pg (ref 26.0–34.0)
MCHC: 33.7 g/dL (ref 30.0–36.0)
MCV: 88.2 fL (ref 78.0–100.0)
Platelets: 254 10*3/uL (ref 150–400)
RBC: 4.24 MIL/uL (ref 3.87–5.11)
RDW: 16.3 % — AB (ref 11.5–15.5)
WBC: 14.1 10*3/uL — AB (ref 4.0–10.5)

## 2015-07-14 LAB — I-STAT BETA HCG BLOOD, ED (MC, WL, AP ONLY): I-stat hCG, quantitative: <5 m[IU]/mL (ref <5)

## 2015-07-14 MED ORDER — ONDANSETRON HCL 4 MG/2ML IJ SOLN
4.0000 mg | Freq: Once | INTRAMUSCULAR | Status: AC
  Administered 2015-07-14: 4 mg via INTRAVENOUS
  Filled 2015-07-14: qty 2

## 2015-07-14 MED ORDER — MORPHINE SULFATE 4 MG/ML IJ SOLN
4.0000 mg | Freq: Once | INTRAMUSCULAR | Status: AC
  Administered 2015-07-14: 4 mg via INTRAVENOUS
  Filled 2015-07-14: qty 1

## 2015-07-14 NOTE — ED Notes (Signed)
Patient here with complaint of mid abdominal pain extending across abdomen. Has been tolerating fluids but not food. Denies vomiting. Denies fever. Also reports dizziness and weakness.

## 2015-07-14 NOTE — ED Provider Notes (Signed)
This chart was scribed for  [place], DO by Altamease Oiler, ED Scribe. This patient was seen in room D32C/D32C and the patient's care was started at 11:40 PM.  TIME SEEN: 11:40 PM   CHIEF COMPLAINT: Abdominal Pain  HPI: Deborah Melendez is a 39 y.o. female who presents to the Emergency Department complaining of constant diffuse lower abdominal pain with onset yesterday. She describes the pain as "I don't know, just severe pain". The pain is rated 10/10 in severity but improves with rest. No aggravating factors. Denies radiation. Associated symptoms include subjective fever and diarrhea. Pt denies chills, nausea, vomiting, hematuria, and dysuria. She is unsure if she has had abnormal vaginal discharge. LNMP is unknown due to Implanon. Sexually active with 2 female partners and "sometimes" uses protection. History of "gonorrhea or chlamydia once". No history of abdominal surgery.    ROS: See HPI Constitutional: no fever  GI: no vomiting Eyes: no drainage  ENT: no runny nose   Cardiovascular:  no chest pain  Resp: no SOB  GU: no dysuria Integumentary: no rash  Allergy: no hives  Musculoskeletal: no leg swelling  Neurological: no slurred speech ROS otherwise negative  PAST MEDICAL HISTORY/PAST SURGICAL HISTORY:  Past Medical History  Diagnosis Date  . MRSA (methicillin resistant Staphylococcus aureus)     surgery on finger 3 years ago    MEDICATIONS:  Prior to Admission medications   Medication Sig Start Date End Date Taking? Authorizing Provider  clotrimazole (LOTRIMIN) 1 % cream Apply to all toenails 2 times daily until symptoms cleared 04/14/13   Domenic Moras, PA-C  diphenhydrAMINE (BENADRYL) 25 MG tablet Take 50 mg by mouth every 6 (six) hours as needed. Skin rashes    Historical Provider, MD  hydrOXYzine (ATARAX/VISTARIL) 25 MG tablet Take 1 tablet (25 mg total) by mouth every 6 (six) hours. 07/18/14   Veryl Speak, MD  ibuprofen (ADVIL,MOTRIN) 600 MG tablet Take 1 tablet (600  mg total) by mouth every 6 (six) hours as needed for pain. 04/14/13   Domenic Moras, PA-C  varenicline (CHANTIX) 0.5 MG tablet Take 1 tablet (0.5 mg total) by mouth 2 (two) times daily. 07/06/14   Deirdre Freida Busman, CNM    ALLERGIES:  No Known Allergies  SOCIAL HISTORY:  History  Substance Use Topics  . Smoking status: Current Every Day Smoker -- 0.50 packs/day    Types: Cigarettes  . Smokeless tobacco: Not on file  . Alcohol Use: No    FAMILY HISTORY: Family History  Problem Relation Age of Onset  . Diabetes Mother   . Hypertension Mother     EXAM: BP 121/92 mmHg  Pulse 106  Temp(Src) 98.7 F (37.1 C)  Resp 20  Ht 5\' 3"  (1.6 m)  SpO2 100%  LMP 06/14/2015 (Approximate) CONSTITUTIONAL: Alert and oriented and responds appropriately to questions. Well-appearing; well-nourished, nontoxic HEAD: Normocephalic EYES: Conjunctivae clear, PERRL ENT: normal nose; no rhinorrhea; moist mucous membranes; pharynx without lesions noted NECK: Supple, no meningismus, no LAD  CARD: RRR; S1 and S2 appreciated; no murmurs, no clicks, no rubs, no gallops RESP: Normal chest excursion without splinting or tachypnea; breath sounds clear and equal bilaterally; no wheezes, no rhonchi, no rales ABD/GI: Normal bowel sounds; non-distended; soft, tender throughout the lower abdomen without guarding or rebound, no tenderness at McBurney's point GU:  Normal external genitalia, large amount of extremely foul-smelling purulent-appearing drainage coming from the cervical os, no vaginal bleeding, no adnexal tenderness or fullness, patient does have cervical motion tenderness but  cervix is not friable BACK:  The back appears normal and is non-tender to palpation, there is no CVA tenderness EXT: Normal ROM in all joints; non-tender to palpation; no edema; normal capillary refill; no cyanosis    SKIN: Normal color for age and race; warm NEURO: Moves all extremities equally PSYCH: The patient's mood and manner are  appropriate. Grooming and personal hygiene are appropriate.  MEDICAL DECISION MAKING: Patient here with exam concerning for PID. She has a significant amount of foul-smelling purulent drainage coming from the cervical os. No adnexal tenderness. We'll obtain labs, urine and transvaginal ultrasound. Will treat with ceftriaxone and doxycycline. Have also obtain HIV and syphilis tests.  ED PROGRESS: Patient's labs show mild cytosis 14.1 with left shift. Urine shows trace leukocytes but no other sign of infection. Wet prep is positive for many white blood cells and many clue cells. She is not pregnant. She is hemodynamically stable, tolerating fluids without difficulty. I feel she can be treated as an outpatient for PID. Have recommended avoiding sexual activity for the next 2 weeks and to have her partners tested for STDs. STD screening is pending. Will discharge on doxycycline 100 mg twice a day for 2 weeks and Flagyl 500 mg twice a day for 1 week. Discussed return precautions. She verbalizes understanding and is comfortable with this plan.    I personally performed the services described in this documentation, which was scribed in my presence. The recorded information has been reviewed and is accurate.    [place], DO 07/15/15 [phone]

## 2015-07-15 ENCOUNTER — Emergency Department (HOSPITAL_COMMUNITY): Payer: Medicaid Other

## 2015-07-15 LAB — URINE MICROSCOPIC-ADD ON

## 2015-07-15 LAB — HIV ANTIBODY (ROUTINE TESTING W REFLEX): HIV Screen 4th Generation wRfx: NONREACTIVE

## 2015-07-15 LAB — COMPREHENSIVE METABOLIC PANEL
ALT: 11 U/L — AB (ref 14–54)
AST: 18 U/L (ref 15–41)
Albumin: 3.6 g/dL (ref 3.5–5.0)
Alkaline Phosphatase: 64 U/L (ref 38–126)
Anion gap: 8 (ref 5–15)
BUN: 7 mg/dL (ref 6–20)
CHLORIDE: 106 mmol/L (ref 101–111)
CO2: 20 mmol/L — ABNORMAL LOW (ref 22–32)
CREATININE: 0.94 mg/dL (ref 0.44–1.00)
Calcium: 8.9 mg/dL (ref 8.9–10.3)
GFR calc Af Amer: >60 mL/min (ref >60)
Glucose, Bld: 108 mg/dL — ABNORMAL HIGH (ref 65–99)
Potassium: 3.1 mmol/L — ABNORMAL LOW (ref 3.5–5.1)
SODIUM: 134 mmol/L — AB (ref 135–145)
Total Bilirubin: 0.7 mg/dL (ref 0.3–1.2)
Total Protein: 8.1 g/dL (ref 6.5–8.1)

## 2015-07-15 LAB — URINALYSIS, ROUTINE W REFLEX MICROSCOPIC
Bilirubin Urine: NEGATIVE
Glucose, UA: NEGATIVE mg/dL
HGB URINE DIPSTICK: NEGATIVE
Ketones, ur: 40 mg/dL — AB
NITRITE: NEGATIVE
Protein, ur: 30 mg/dL — AB
Specific Gravity, Urine: 1.025 (ref 1.005–1.030)
Urobilinogen, UA: 1 mg/dL (ref 0.0–1.0)
pH: 5.5 (ref 5.0–8.0)

## 2015-07-15 LAB — LIPASE, BLOOD: LIPASE: 23 U/L (ref 22–51)

## 2015-07-15 LAB — GC/CHLAMYDIA PROBE AMP (~~LOC~~) NOT AT ARMC
Chlamydia: NEGATIVE
NEISSERIA GONORRHEA: POSITIVE — AB

## 2015-07-15 LAB — WET PREP, GENITAL
Trich, Wet Prep: NONE SEEN
Yeast Wet Prep HPF POC: NONE SEEN

## 2015-07-15 MED ORDER — METRONIDAZOLE 500 MG PO TABS
500.0000 mg | ORAL_TABLET | Freq: Once | ORAL | Status: AC
  Administered 2015-07-15: 500 mg via ORAL
  Filled 2015-07-15: qty 1

## 2015-07-15 MED ORDER — CEFTRIAXONE SODIUM 250 MG IJ SOLR
250.0000 mg | Freq: Once | INTRAMUSCULAR | Status: AC
  Administered 2015-07-15: 250 mg via INTRAMUSCULAR
  Filled 2015-07-15: qty 250

## 2015-07-15 MED ORDER — ONDANSETRON 4 MG PO TBDP
4.0000 mg | ORAL_TABLET | Freq: Three times a day (TID) | ORAL | Status: DC | PRN
Start: 2015-07-15 — End: 2015-08-08

## 2015-07-15 MED ORDER — DOXYCYCLINE HYCLATE 100 MG PO TABS
100.0000 mg | ORAL_TABLET | Freq: Once | ORAL | Status: AC
  Administered 2015-07-15: 100 mg via ORAL
  Filled 2015-07-15: qty 1

## 2015-07-15 MED ORDER — IBUPROFEN 800 MG PO TABS: 800.0000 mg | ORAL_TABLET | Freq: Three times a day (TID) | ORAL | Status: DC | PRN

## 2015-07-15 MED ORDER — LIDOCAINE HCL (PF) 1 % IJ SOLN
INTRAMUSCULAR | Status: AC
  Administered 2015-07-15: 01:00:00
  Filled 2015-07-15: qty 5

## 2015-07-15 MED ORDER — MORPHINE SULFATE 4 MG/ML IJ SOLN
4.0000 mg | Freq: Once | INTRAMUSCULAR | Status: AC
  Administered 2015-07-15: 4 mg via INTRAVENOUS
  Filled 2015-07-15: qty 1

## 2015-07-15 MED ORDER — DOXYCYCLINE HYCLATE 100 MG PO CAPS: 100.0000 mg | ORAL_CAPSULE | Freq: Two times a day (BID) | ORAL | Status: DC

## 2015-07-15 MED ORDER — HYDROCODONE-ACETAMINOPHEN 5-325 MG PO TABS: 1.0000 | ORAL_TABLET | ORAL | Status: DC | PRN

## 2015-07-15 MED ORDER — IOHEXOL 300 MG/ML  SOLN: 25.0000 mL | Freq: Once | INTRAMUSCULAR | Status: DC | PRN

## 2015-07-15 MED ORDER — METRONIDAZOLE 500 MG PO TABS: 500.0000 mg | ORAL_TABLET | Freq: Two times a day (BID) | ORAL | Status: DC

## 2015-07-15 NOTE — ED Notes (Signed)
Patient transported to Ultrasound 

## 2015-07-15 NOTE — Discharge Instructions (Signed)
Pelvic Inflammatory Disease Pelvic inflammatory disease (PID) refers to an infection in some or all of the female organs. The infection can be in the uterus, ovaries, fallopian tubes, or the surrounding tissues in the pelvis. PID can cause abdominal or pelvic pain that comes on suddenly (acute pelvic pain). PID is a serious infection because it can lead to lasting (chronic) pelvic pain or the inability to have children (infertile).  CAUSES  The infection is often caused by the normal bacteria found in the vaginal tissues. PID may also be caused by an infection that is spread during sexual contact. PID can also occur following:   The birth of a baby.   A miscarriage.   An abortion.   Major pelvic surgery.   The use of an intrauterine device (IUD).   A sexual assault.  RISK FACTORS Certain factors can put a person at higher risk for PID, such as:  Being younger than 25 years.  Being sexually active at [place] age.  Usingnonbarrier contraception.  Havingmultiple sexual partners.  Having sex with someone who has symptoms of a genital infection.  Using oral contraception. Other times, certain behaviors can increase the possibility of getting PID, such as:  Having sex during your period.  Using a vaginal douche.  Having an intrauterine device (IUD) in place. SYMPTOMS   Abdominal or pelvic pain.   Fever.   Chills.   Abnormal vaginal discharge.  Abnormal uterine bleeding.   Unusual pain shortly after finishing your period. DIAGNOSIS  Your caregiver will choose some of the following methods to make a diagnosis, such as:   Performinga physical exam and history. A pelvic exam typically reveals a very tender uterus and surrounding pelvis.   Ordering laboratory tests including a pregnancy test, blood tests, and urine test.  Orderingcultures of the vagina and cervix to check for a sexually transmitted infection (STI).  Performing an ultrasound.    Performing a laparoscopic procedure to look inside the pelvis.  TREATMENT   Antibiotic medicines may be prescribed and taken by mouth.   Sexual partners may be treated when the infection is caused by a sexually transmitted disease (STD).   Hospitalization may be needed to give antibiotics intravenously.  Surgery may be needed, but this is rare. It may take weeks until you are completely well. If you are diagnosed with PID, you should also be checked for human immunodeficiency virus (HIV). HOME CARE INSTRUCTIONS   If given, take your antibiotics as directed. Finish the medicine even if you start to feel better.   Only take over-the-counter or prescription medicines for pain, discomfort, or fever as directed by your caregiver.   Do not have sexual intercourse until treatment is completed or as directed by your caregiver. If PID is confirmed, your recent sexual partner(s) will need treatment.   Keep your follow-up appointments. SEEK MEDICAL CARE IF:   You have increased or abnormal vaginal discharge.   You need prescription medicine for your pain.   You vomit.   You cannot take your medicines.   Your partner has an STD.  SEEK IMMEDIATE MEDICAL CARE IF:   You have a fever.   You have increased abdominal or pelvic pain.   You have chills.   You have pain when you urinate.   You are not better after 72 hours following treatment.  MAKE SURE YOU:   Understand these instructions.  Will watch your condition.  Will get help right away if you are not doing well or get worse.  Document Released: 12/14/2005 Document Revised: 04/10/2013 Document Reviewed: 12/10/2011 [facility] Patient Information 2015 [place], [place]. This information is not intended to replace advice given to you by your health care provider. Make sure you discuss any questions you have with your health care provider.  Bacterial Vaginosis Bacterial vaginosis is a vaginal infection that  occurs when the normal balance of bacteria in the vagina is disrupted. It results from an overgrowth of certain bacteria. This is the most common vaginal infection in women of childbearing age. Treatment is important to prevent complications, especially in pregnant women, as it can cause a premature delivery. CAUSES  Bacterial vaginosis is caused by an increase in harmful bacteria that are normally present in smaller amounts in the vagina. Several different kinds of bacteria can cause bacterial vaginosis. However, the reason that the condition develops is not fully understood. RISK FACTORS Certain activities or behaviors can put you at an increased risk of developing bacterial vaginosis, including:  Having a new sex partner or multiple sex partners.  Douching.  Using an intrauterine device (IUD) for contraception. Women do not get bacterial vaginosis from toilet seats, bedding, swimming pools, or contact with objects around them. SIGNS AND SYMPTOMS  Some women with bacterial vaginosis have no signs or symptoms. Common symptoms include:  Grey vaginal discharge.  A fishlike odor with discharge, especially after sexual intercourse.  Itching or burning of the vagina and vulva.  Burning or pain with urination. DIAGNOSIS  Your health care provider will take a medical history and examine the vagina for signs of bacterial vaginosis. A sample of vaginal fluid may be taken. Your health care provider will look at this sample under a microscope to check for bacteria and abnormal cells. A vaginal pH test may also be done.  TREATMENT  Bacterial vaginosis may be treated with antibiotic medicines. These may be given in the form of a pill or a vaginal cream. A second round of antibiotics may be prescribed if the condition comes back after treatment.  HOME CARE INSTRUCTIONS   Only take over-the-counter or prescription medicines as directed by your health care provider.  If antibiotic medicine was  prescribed, take it as directed. Make sure you finish it even if you start to feel better.  Do not have sex until treatment is completed.  Tell all sexual partners that you have a vaginal infection. They should see their health care provider and be treated if they have problems, such as a mild rash or itching.  Practice safe sex by using condoms and only having one sex partner. SEEK MEDICAL CARE IF:   Your symptoms are not improving after 3 days of treatment.  You have increased discharge or pain.  You have a fever. MAKE SURE YOU:   Understand these instructions.  Will watch your condition.  Will get help right away if you are not doing well or get worse. FOR MORE INFORMATION  Centers for Disease Control and Prevention, Division of STD Prevention: AppraiserFraud.fi American Sexual Health Association (ASHA): www.ashastd.org  Document Released: 12/14/2005 Document Revised: 10/04/2013 Document Reviewed: 07/26/2013 [facility] Patient Information 2015 [place], [place]. This information is not intended to replace advice given to you by your health care provider. Make sure you discuss any questions you have with your health care provider.    [facility] [email or web address] [place] [address] [place], [place] [phone]    [facility] [place], [place] [phone]    [facility]  [place] [address] [place], [place] [phone]   [facility] [email or web address] [address] [place], [place] [phone]   [facility] [email or web address] [address] [place], [place] [phone]   [facility] [email or web address] [phone]

## 2015-07-16 ENCOUNTER — Telehealth (HOSPITAL_COMMUNITY): Payer: Self-pay

## 2015-07-16 LAB — RPR: RPR Ser Ql: NONREACTIVE

## 2015-07-16 NOTE — ED Notes (Signed)
Positive for gonorrhea. Treated per protocol. Attempting to contact pt.

## 2015-07-17 ENCOUNTER — Telehealth (HOSPITAL_COMMUNITY): Payer: Self-pay

## 2015-07-17 NOTE — ED Notes (Signed)
Unable to reach by telephone. Letter sent to address on record.  

## 2015-08-07 ENCOUNTER — Encounter (HOSPITAL_COMMUNITY): Payer: Self-pay | Admitting: Emergency Medicine

## 2015-08-07 ENCOUNTER — Emergency Department (HOSPITAL_COMMUNITY)
Admission: EM | Admit: 2015-08-07 | Discharge: 2015-08-08 | Disposition: A | Discharge status: Other Acute Inpatient Hospital | Payer: Medicaid Other | Attending: Emergency Medicine | Admitting: Emergency Medicine

## 2015-08-07 DIAGNOSIS — R45850 Homicidal ideations: Secondary | ICD-10-CM

## 2015-08-07 DIAGNOSIS — R45851 Suicidal ideations: Secondary | ICD-10-CM

## 2015-08-07 DIAGNOSIS — O99331 Smoking (tobacco) complicating pregnancy, first trimester: Secondary | ICD-10-CM | POA: Diagnosis not present

## 2015-08-07 DIAGNOSIS — F17210 Nicotine dependence, cigarettes, uncomplicated: Secondary | ICD-10-CM | POA: Diagnosis not present

## 2015-08-07 DIAGNOSIS — Z8614 Personal history of Methicillin resistant Staphylococcus aureus infection: Secondary | ICD-10-CM | POA: Insufficient documentation

## 2015-08-07 DIAGNOSIS — Z3A08 8 weeks gestation of pregnancy: Secondary | ICD-10-CM | POA: Insufficient documentation

## 2015-08-07 DIAGNOSIS — O99341 Other mental disorders complicating pregnancy, first trimester: Secondary | ICD-10-CM | POA: Insufficient documentation

## 2015-08-07 DIAGNOSIS — Z79899 Other long term (current) drug therapy: Secondary | ICD-10-CM | POA: Insufficient documentation

## 2015-08-07 LAB — COMPREHENSIVE METABOLIC PANEL
ALT: 13 U/L — AB (ref 14–54)
AST: 18 U/L (ref 15–41)
Albumin: 4.1 g/dL (ref 3.5–5.0)
Alkaline Phosphatase: 54 U/L (ref 38–126)
Anion gap: 10 (ref 5–15)
BILIRUBIN TOTAL: 1.1 mg/dL (ref 0.3–1.2)
BUN: 17 mg/dL (ref 6–20)
CHLORIDE: 105 mmol/L (ref 101–111)
CO2: 23 mmol/L (ref 22–32)
Calcium: 9.4 mg/dL (ref 8.9–10.3)
Creatinine, Ser: 0.9 mg/dL (ref 0.44–1.00)
GFR calc Af Amer: >60 mL/min (ref >60)
GFR calc non Af Amer: >60 mL/min (ref >60)
Glucose, Bld: 83 mg/dL (ref 65–99)
Potassium: 3.8 mmol/L (ref 3.5–5.1)
SODIUM: 138 mmol/L (ref 135–145)
Total Protein: 7.7 g/dL (ref 6.5–8.1)

## 2015-08-07 LAB — URINALYSIS, ROUTINE W REFLEX MICROSCOPIC
Bilirubin Urine: NEGATIVE
Glucose, UA: NEGATIVE mg/dL
Hgb urine dipstick: NEGATIVE
Ketones, ur: NEGATIVE mg/dL
LEUKOCYTES UA: NEGATIVE
NITRITE: NEGATIVE
PROTEIN: NEGATIVE mg/dL
SPECIFIC GRAVITY, URINE: 1.021 (ref 1.005–1.030)
UROBILINOGEN UA: 0.2 mg/dL (ref 0.0–1.0)
pH: 6.5 (ref 5.0–8.0)

## 2015-08-07 LAB — RAPID URINE DRUG SCREEN, HOSP PERFORMED
Amphetamines: NOT DETECTED
Barbiturates: NOT DETECTED
Benzodiazepines: NOT DETECTED
COCAINE: NOT DETECTED
Opiates: NOT DETECTED
TETRAHYDROCANNABINOL: NOT DETECTED

## 2015-08-07 LAB — CBC
HCT: 38.3 % (ref 36.0–46.0)
HEMOGLOBIN: 12.7 g/dL (ref 12.0–15.0)
MCH: 29.8 pg (ref 26.0–34.0)
MCHC: 33.2 g/dL (ref 30.0–36.0)
MCV: 89.9 fL (ref 78.0–100.0)
Platelets: 247 10*3/uL (ref 150–400)
RBC: 4.26 MIL/uL (ref 3.87–5.11)
RDW: 17.1 % — ABNORMAL HIGH (ref 11.5–15.5)
WBC: 5.4 10*3/uL (ref 4.0–10.5)

## 2015-08-07 LAB — HCG, QUANTITATIVE, PREGNANCY: hCG, Beta Chain, Quant, S: <1 m[IU]/mL (ref <5)

## 2015-08-07 LAB — SALICYLATE LEVEL: Salicylate Lvl: <4 mg/dL (ref 2.8–30.0)

## 2015-08-07 LAB — ETHANOL: Alcohol, Ethyl (B): <5 mg/dL (ref <5)

## 2015-08-07 LAB — ACETAMINOPHEN LEVEL

## 2015-08-07 MED ORDER — CEFTRIAXONE SODIUM 250 MG IJ SOLR
250.0000 mg | Freq: Once | INTRAMUSCULAR | Status: DC
Start: 2015-08-07 — End: 2015-08-07

## 2015-08-07 MED ORDER — AZITHROMYCIN 250 MG PO TABS: 1000.0000 mg | ORAL_TABLET | Freq: Once | ORAL | Status: DC

## 2015-08-07 MED ORDER — HYDROXYZINE HCL 25 MG PO TABS
25.0000 mg | ORAL_TABLET | Freq: Once | ORAL | Status: DC
  Filled 2015-08-07: qty 1

## 2015-08-07 NOTE — BH Assessment (Signed)
Assessment Note  Deborah Melendez is an 39 y.o. female presenting to [facility]. Patient has complaints of increased depression and suicidal thoughts. Patient stating, "I don't know if I want to live anymore". Onset of suicidal thoughts started Monday afternoon after she was discharged from [facility]. Patient spent 3 weeks at [facility] for drug rehabilitation (crack cocaine). Last used crack cocaine 3 weeks ago. Sts that she did not receive any of her "psych meds" and wants to get back on them. Patient sts she was taking Trazodone (dosage/frequency unk) and "Somethig to help me sleep". Patient is suicidal because she doesn't have her meds. She has a plan to cut her wrist, "Just like I did before". Patient has access to kitchen knives. She also tried to commit suicide by cutting her wrist years ago. She recalls the trigger for her previous suicide attempt as "depression". She denies HI and AVH's. Patient does not have a outpatient mental health provider. She was hospitalized at [facility] in the past. Patient is requesting inpatient hospitalization today.    Axis I: Major Depressive Disorder, Recurrent, Severe without psychotic features Axis II: Deferred Axis III:  Past Medical History  Diagnosis Date  . MRSA (methicillin resistant Staphylococcus aureus)     surgery on finger 3 years ago   Axis IV: other psychosocial or environmental problems, problems related to social environment, problems with access to health care services and problems with primary support group Axis V: 31-40 impairment in reality testing  Past Medical History:  Past Medical History  Diagnosis Date  . MRSA (methicillin resistant Staphylococcus aureus)     surgery on finger 3 years ago    Past Surgical History  Procedure Laterality Date  . Finger surgery      Family History:  Family History  Problem Relation Age of Onset  . Diabetes Mother   . Hypertension Mother     Social History:  reports that she has been smoking  Cigarettes.  She has been smoking about 0.50 packs per day. She does not have any smokeless tobacco history on file. She reports that she does not drink alcohol or use illicit drugs.  Additional Social History:  Alcohol / Drug Use Pain Medications: SEE MAR Prescriptions: SEE MAR Over the Counter: SEE MAR History of alcohol / drug use?: Yes Negative Consequences of Use: Financial, Personal relationships Substance #1 Name of Substance 1: Crack Cocaine  1 - Age of First Use: teens  1 - Amount (size/oz): varies  1 - Frequency: daily  1 - Duration: on-going  1 - Last Use / Amount: 3 weeks ago   CIWA: CIWA-Ar BP: 110/67 mmHg Pulse Rate: 73 COWS:    Allergies: No Known Allergies  Home Medications:  (Not in a hospital admission)  OB/GYN Status:  Patient's last menstrual period was 06/14/2015 (approximate).  General Assessment Data [facility] of [facility]: [facility] Is this a Tele or Face-to-Face Assessment?: Face-to-Face Is this an Initial Assessment or a Re-assessment for this encounter?: Initial Assessment Marital status: Single Maiden name:  (n/a) Is patient pregnant?: Yes Pregnancy Status: No Living Arrangements: Other (Comment) (lives with fiance) Can pt return to current living arrangement?: Yes Admission Status: Voluntary Is patient capable of signing voluntary admission?: Yes Referral Source: Self/Family/Friend Insurance type:  (Medicaid )     Crisis Care Plan Living Arrangements: Other (Comment) (lives with fiance) Name of Psychiatrist:  (Patient does not have a psychiatrist ) Name of Therapist:  (Patient does not have a therapist)  Education Status Is patient currently  in school?: No Current Grade:  (n/a) Highest grade of school patient has completed:  (n/a) Name of school:  (n/a) Contact person:  (n/a)  Risk to self with the past 6 months Suicidal Ideation: Yes-Currently Present Has patient been a risk to self within the past 6 months prior to admission? :  Yes Suicidal Intent: Yes-Currently Present Has patient had any suicidal intent within the past 6 months prior to admission? : Yes Is patient at risk for suicide?: Yes Suicidal Plan?: Yes-Currently Present Has patient had any suicidal plan within the past 6 months prior to admission? : Yes Specify Current Suicidal Plan:  ("I could cut my wrist like I've done before") Access to Means: Yes Specify Access to Suicidal Means:  ("I have knives in the kitchen at home") What has been your use of drugs/alcohol within the last 12 months?:  (pt reports a history of crack cocaine) Previous Attempts/Gestures: No How many times?:  (0) Other Self Harm Risks: n/a Triggers for Past Attempts: Other (Comment) Intentional Self Injurious Behavior: None Family Suicide History: No Recent stressful life event(s): Other (Comment) ("Getting off of drugs...3 weeks without crack cocaine") Persecutory voices/beliefs?: No Depression: Yes Depression Symptoms: Despondent, Insomnia, Tearfulness, Isolating, Fatigue, Guilt, Loss of interest in usual pleasures, Feeling angry/irritable, Feeling worthless/self pity Substance abuse history and/or treatment for substance abuse?: No Suicide prevention information given to non-admitted patients: Not applicable  Risk to Others within the past 6 months Homicidal Ideation: No Does patient have any lifetime risk of violence toward others beyond the six months prior to admission? : No Thoughts of Harm to Others: No Current Homicidal Intent: No Current Homicidal Plan: No Access to Homicidal Means: No Identified Victim:  (n/a) History of harm to others?: No Assessment of Violence: None Noted Violent Behavior Description:  (Patient is calm and cooperative ) Does patient have access to weapons?: No Criminal Charges Pending?: No Does patient have a court date: No Is patient on probation?: No  Psychosis Hallucinations: None noted Delusions: None noted  Mental Status  Report Appearance/Hygiene: Disheveled Eye Contact: Good Motor Activity: Freedom of movement Speech: Logical/coherent Level of Consciousness: Alert Mood: Depressed Affect: Appropriate to circumstance Anxiety Level: None Thought Processes: Coherent, Relevant Judgement: Impaired Orientation: Person, Place, Time, Situation Obsessive Compulsive Thoughts/Behaviors: None  Cognitive Functioning Concentration: Decreased Memory: Recent Intact, Remote Intact IQ: Average Insight: Fair Impulse Control: Fair Appetite: Fair Weight Loss:  (none reported) Weight Gain:  (none reported) Sleep: Decreased Total Hours of Sleep:  (up to 4 hrs per night ) Vegetative Symptoms: None  ADLScreening [facility] Assessment Services) Patient's cognitive ability adequate to safely complete daily activities?: Yes Patient able to express need for assistance with ADLs?: No Independently performs ADLs?: Yes (appropriate for developmental age)  Prior Inpatient Therapy Prior Inpatient Therapy: Yes Prior Therapy Dates:  (current) Prior Therapy Facilty/Provider(s):  [facility]) Reason for Treatment:  (depression and substance abuse; medication managment )  Prior Outpatient Therapy Prior Outpatient Therapy: No Prior Therapy Dates:  (n/a) Prior Therapy Facilty/Provider(s):  (n/a) Reason for Treatment:  (n/a) Does patient have an ACCT team?: No Does patient have Intensive In-House Services?  : No Does patient have [facility] services? : No Does patient have P4CC services?: No  ADL Screening (condition at time of admission) Patient's cognitive ability adequate to safely complete daily activities?: Yes Is the patient deaf or have difficulty hearing?: No Does the patient have difficulty seeing, even when wearing glasses/contacts?: No Does the patient have difficulty concentrating, remembering, or making decisions?: Yes Patient  able to express need for assistance with ADLs?: No Does the patient have difficulty dressing or  bathing?: No Independently performs ADLs?: Yes (appropriate for developmental age) Does the patient have difficulty walking or climbing stairs?: No Weakness of Legs: None Weakness of Arms/Hands: None  Home Assistive Devices/Equipment Home Assistive Devices/Equipment: None    Abuse/Neglect Assessment (Assessment to be complete while patient is alone) Physical Abuse: Denies Verbal Abuse: Denies Sexual Abuse: Denies Exploitation of patient/patient's resources: Denies Self-Neglect: Denies Values / Beliefs Cultural Requests During Hospitalization: None Spiritual Requests During Hospitalization: None   Advance Directives (For Healthcare) Does patient have an advance directive?: No Would patient like information on creating an advanced directive?: No - patient declined information Nutrition Screen- [facility] Patient's home diet: Regular  Additional Information 1:1 In Past 12 Months?: No CIRT Risk: No Elopement Risk: No Does patient have medical clearance?: Yes     Disposition:  Disposition Initial Assessment Completed for this Encounter: Yes Disposition of Patient: Inpatient treatment program (Joosephine, NP recommends inpatient treatment )  On Site Evaluation by:   Reviewed with Physician:    Evangeline Gula 08/07/2015 1:37 PM

## 2015-08-07 NOTE — ED Notes (Signed)
Pt states that since Monday night she has had thoughts of wanting to hurt herself and her fiance but denies having a plan on how she would harm herself or finace.  Pt states that she is out of her medications for quite a while bc she was at [facility] for crack addiction.  Pt states that she has been admitted to psych before in past.

## 2015-08-07 NOTE — ED Notes (Signed)
Per Dr. Darleene Cleaver and Reginold Agent, NP patient meets criteria for a inpatient treatment. Patient is appropriate for a [place] 400 hall bed. Pt accepted to [facility] bed 404-1. Support paperwork completed. Nursing report [phone].

## 2015-08-07 NOTE — ED Provider Notes (Addendum)
Was called an additional time by nurse it turns out that she actually has been treated for gonorrhea. We will cancel order.  Was called by nurse and requested treatment for gonorrhea. Apparently they have been trying to contact her at home for positive gonorrhea test recently. We will treat for gonorrhea.  Prabhleen Montemayor Julio Alm, MD 08/07/15 1452  Latisha Lasch Julio Alm, MD 08/07/15 1505

## 2015-08-07 NOTE — ED Notes (Signed)
Patient got upset after another patient on the unit was yelling. Patient then stated that she did not want to go over to [facility]. Reported that she shouldn't have signed those papers to go over to [facility]. Reports that these patients are crazy and she is not like them Talked with patient to reason with her but her mind was made up. Patient called her fiancee to come and get her. She is very argumentative about involuntary process and keeps saying that she is going home.

## 2015-08-07 NOTE — ED Notes (Signed)
Patient admits to [facility] with a plan to cut her wrist. Patient denies HI and AVH at this time. Patient appears irritable at this time. Patient visiting with friend at this time. Plan of care discussed with patient. Patient voices no concerns at this time. Encouragement and support provided and safety maintain. Q 15 min safety checks remain in place.

## 2015-08-07 NOTE — ED Provider Notes (Signed)
CSN: [id number]     Arrival date & time 08/07/15  1012 History   First MD Initiated Contact with Patient 08/07/15 1037     Chief complaint. Well check   (Consider location/radiation/quality/duration/timing/severity/associated sxs/prior Treatment) The history is provided by the patient. No language interpreter was used.   Deborah Melendez is a 39 year old female who presents to the emergency Department complaining of SI and HI. When asked if she had a plan she stated that she thought about using the knives in the kitchen but did not say what she would do with them. She states she woke up this morning angry at her fianc and began yelling at him for no reason because she thinks she may be depressed. Her HI was directed towards her fianc. She denies taking any psych medications. She states she has had these feelings in the past. She used to take trazodone but is no longer on this medication. She denies any hallucinations, recent alcohol or drug use. She admits to being a previous cocaine addict.  She denies any chest pain, shortness of breath, abdominal pain, nausea, vomiting.  Past Medical History  Diagnosis Date  . MRSA (methicillin resistant Staphylococcus aureus)     surgery on finger 3 years ago   Past Surgical History  Procedure Laterality Date  . Finger surgery     Family History  Problem Relation Age of Onset  . Diabetes Mother   . Hypertension Mother    Social History  Substance Use Topics  . Smoking status: Current Every Day Smoker -- 0.50 packs/day    Types: Cigarettes  . Smokeless tobacco: None  . Alcohol Use: No   OB History    Gravida Para Term Preterm AB TAB SAB Ectopic Multiple Living   7 5        5      Review of Systems  Respiratory: Negative for shortness of breath.   Cardiovascular: Negative for chest pain.  Gastrointestinal: Negative for nausea, vomiting and abdominal pain.  All other systems reviewed and are negative.     Allergies  Review of patient's  allergies indicates no known allergies.  Home Medications   Prior to Admission medications   Medication Sig Start Date End Date Taking? Authorizing Provider  diphenhydrAMINE (SOMINEX) 25 MG tablet Take 25 mg by mouth daily.   Yes Historical Provider, MD  Multiple Vitamins-Minerals (MULTIVITAMIN ADULT PO) Take 1 tablet by mouth daily.   Yes Historical Provider, MD  clotrimazole (LOTRIMIN) 1 % cream Apply to all toenails 2 times daily until symptoms cleared Patient not taking: Reported on 07/15/2015 04/14/13   Domenic Moras, PA-C  doxycycline (VIBRAMYCIN) 100 MG capsule Take 1 capsule (100 mg total) by mouth 2 (two) times daily. Patient not taking: Reported on 08/07/2015 07/15/15   Delice Bison Ward, DO  HYDROcodone-acetaminophen (NORCO/VICODIN) 5-325 MG per tablet Take 1 tablet by mouth every 4 (four) hours as needed. Patient not taking: Reported on 08/07/2015 07/15/15   Delice Bison Ward, DO  hydrOXYzine (ATARAX/VISTARIL) 25 MG tablet Take 1 tablet (25 mg total) by mouth every 6 (six) hours. Patient not taking: Reported on 07/15/2015 07/18/14   Veryl Speak, MD  ibuprofen (ADVIL,MOTRIN) 800 MG tablet Take 1 tablet (800 mg total) by mouth every 8 (eight) hours as needed for mild pain. Patient not taking: Reported on 08/07/2015 07/15/15   Kristen N Ward, DO  metroNIDAZOLE (FLAGYL) 500 MG tablet Take 1 tablet (500 mg total) by mouth 2 (two) times daily. Do not drink alcohol with this  medication. Patient not taking: Reported on 08/07/2015 07/15/15   Kristen N Ward, DO  ondansetron (ZOFRAN ODT) 4 MG disintegrating tablet Take 1 tablet (4 mg total) by mouth every 8 (eight) hours as needed for nausea or vomiting. Patient not taking: Reported on 08/07/2015 07/15/15   Delice Bison Ward, DO  varenicline (CHANTIX) 0.5 MG tablet Take 1 tablet (0.5 mg total) by mouth 2 (two) times daily. Patient not taking: Reported on 07/15/2015 07/06/14   Deirdre C Poe, CNM   BP 110/67 mmHg  Pulse 73  Temp(Src) 98.3 F (36.8 C) (Oral)  Resp  73  SpO2 98%  LMP 06/14/2015 (Approximate) Physical Exam  Constitutional: She appears well-developed and well-nourished.  HENT:  Head: Normocephalic.  Eyes: Conjunctivae are normal.  Neck: Normal range of motion.  Cardiovascular: Normal rate and regular rhythm.   Pulmonary/Chest: Effort normal. No accessory muscle usage. No respiratory distress. She has no decreased breath sounds. She has no wheezes.  Abdominal: Soft.  Musculoskeletal: Normal range of motion.  Neurological: She is alert.  Skin: Skin is warm.  Psychiatric: Her speech is normal and behavior is normal. She is not aggressive and not actively hallucinating. She expresses homicidal and suicidal ideation.  Nursing note and vitals reviewed.   ED Course  Procedures (including critical care time) Labs Review Labs Reviewed  COMPREHENSIVE METABOLIC PANEL - Abnormal; Notable for the following:    ALT 13 (*)    All other components within normal limits  ACETAMINOPHEN LEVEL - Abnormal; Notable for the following:    Acetaminophen (Tylenol), Serum <10 (*)    All other components within normal limits  CBC - Abnormal; Notable for the following:    RDW 17.1 (*)    All other components within normal limits  ETHANOL  SALICYLATE LEVEL  URINE RAPID DRUG SCREEN, HOSP PERFORMED  HCG, QUANTITATIVE, PREGNANCY    Imaging Review No results found.   EKG Interpretation None      MDM   Final diagnoses:  Suicidal ideation  Homicidal ideation  Patient presents for SI and HI towards her fianc. Her vitals are stable and she is currently in no acute distress. She is not complaining of pain. Labs are unremarkable. She is medically cleared for further evaluation by psychiatry. Psych hold orders place.     Ottie Glazier, PA-C 08/07/15 Chili Liu, MD 08/07/15 1910

## 2015-08-07 NOTE — ED Notes (Signed)
Patient reports increased depression with SI recently. States that she doesn't really know if she should go inpatient or not but feels she needs monitoring and to get back on medications. Requesting a snack when brought back. Watching TV and cooperative on the unit.

## 2015-08-08 ENCOUNTER — Encounter (HOSPITAL_COMMUNITY): Payer: Self-pay | Admitting: *Deleted

## 2015-08-08 ENCOUNTER — Inpatient Hospital Stay (HOSPITAL_COMMUNITY)
Admission: AD | Admit: 2015-08-08 | Discharge: 2015-08-10 | DRG: 885 | Disposition: A | Discharge status: Home / Self Care | Payer: Medicaid Other | Source: Intra-hospital | Attending: Psychiatry | Admitting: Psychiatry

## 2015-08-08 DIAGNOSIS — F17210 Nicotine dependence, cigarettes, uncomplicated: Secondary | ICD-10-CM | POA: Diagnosis present

## 2015-08-08 DIAGNOSIS — R45851 Suicidal ideations: Secondary | ICD-10-CM | POA: Diagnosis present

## 2015-08-08 DIAGNOSIS — F332 Major depressive disorder, recurrent severe without psychotic features: Secondary | ICD-10-CM | POA: Diagnosis present

## 2015-08-08 DIAGNOSIS — Z915 Personal history of self-harm: Secondary | ICD-10-CM | POA: Diagnosis not present

## 2015-08-08 DIAGNOSIS — F1420 Cocaine dependence, uncomplicated: Secondary | ICD-10-CM | POA: Diagnosis present

## 2015-08-08 HISTORY — DX: Major depressive disorder, recurrent severe without psychotic features: F33.2

## 2015-08-08 LAB — URINALYSIS, ROUTINE W REFLEX MICROSCOPIC
BILIRUBIN URINE: NEGATIVE
Glucose, UA: NEGATIVE mg/dL
HGB URINE DIPSTICK: NEGATIVE
Ketones, ur: NEGATIVE mg/dL
NITRITE: NEGATIVE
PH: 6 (ref 5.0–8.0)
Protein, ur: NEGATIVE mg/dL
Specific Gravity, Urine: 1.026 (ref 1.005–1.030)
UROBILINOGEN UA: 1 mg/dL (ref 0.0–1.0)

## 2015-08-08 LAB — URINE MICROSCOPIC-ADD ON

## 2015-08-08 LAB — TSH: TSH: 0.693 u[IU]/mL (ref 0.350–4.500)

## 2015-08-08 MED ORDER — ALUM & MAG HYDROXIDE-SIMETH 200-200-20 MG/5ML PO SUSP: 30.0000 mL | ORAL | Status: DC | PRN

## 2015-08-08 MED ORDER — ACETAMINOPHEN 325 MG PO TABS
650.0000 mg | ORAL_TABLET | Freq: Four times a day (QID) | ORAL | Status: DC | PRN
Start: 2015-08-08 — End: 2015-08-10

## 2015-08-08 MED ORDER — MAGNESIUM HYDROXIDE 400 MG/5ML PO SUSP: 30.0000 mL | Freq: Every day | ORAL | Status: DC | PRN

## 2015-08-08 MED ORDER — NICOTINE 21 MG/24HR TD PT24
21.0000 mg | MEDICATED_PATCH | Freq: Every day | TRANSDERMAL | Status: DC
Start: 2015-08-09 — End: 2015-08-10
  Administered 2015-08-09 – 2015-08-10 (×2): 21 mg via TRANSDERMAL
  Filled 2015-08-08: qty 1
  Filled 2015-08-08: qty 14
  Filled 2015-08-08: qty 1
  Filled 2015-08-08: qty 14
  Filled 2015-08-08: qty 1

## 2015-08-08 MED ORDER — FLUOXETINE HCL 10 MG PO CAPS
10.0000 mg | ORAL_CAPSULE | Freq: Every day | ORAL | Status: DC
  Administered 2015-08-09: 10 mg via ORAL
  Filled 2015-08-08 (×4): qty 1

## 2015-08-08 MED ORDER — FLUOXETINE HCL 10 MG PO CAPS
10.0000 mg | ORAL_CAPSULE | Freq: Once | ORAL | Status: AC
  Administered 2015-08-08: 10 mg via ORAL
  Filled 2015-08-08: qty 1

## 2015-08-08 MED ORDER — TRAZODONE HCL 50 MG PO TABS
50.0000 mg | ORAL_TABLET | Freq: Every day | ORAL | Status: DC
  Administered 2015-08-08 – 2015-08-09 (×3): 50 mg via ORAL
  Filled 2015-08-08 (×3): qty 1
  Filled 2015-08-08: qty 3
  Filled 2015-08-08: qty 1
  Filled 2015-08-08: qty 3

## 2015-08-08 MED ORDER — HYDROXYZINE HCL 25 MG PO TABS
25.0000 mg | ORAL_TABLET | Freq: Four times a day (QID) | ORAL | Status: DC | PRN
  Administered 2015-08-08 – 2015-08-09 (×3): 25 mg via ORAL
  Filled 2015-08-08 (×3): qty 1
  Filled 2015-08-08: qty 10

## 2015-08-08 NOTE — Plan of Care (Signed)
Problem: Alteration in mood Goal: LTG-Patient reports reduction in suicidal thoughts (Patient reports reduction in suicidal thoughts and is able to verbalize a safety plan for whenever patient is feeling suicidal)  Outcome: Progressing Pt denies SI this shift.  She verbally contracted for safety.    Problem: Diagnosis: Increased Risk For Suicide Attempt Goal: STG-Patient Will Attend All Groups On The Unit Outcome: Progressing Pt attended evening group on 08/08/15.

## 2015-08-08 NOTE — BHH Group Notes (Signed)
[facility] Group Therapy 08/08/2015 1:15pm  Type of Therapy: Mental Health Association Presentation  Pt did not attend, declined invitation.   Peri Maris, [place] 08/08/2015 1:33 PM

## 2015-08-08 NOTE — BHH Suicide Risk Assessment (Signed)
[facility] Admission Suicide Risk Assessment   Nursing information obtained from:    Demographic factors:    Current Mental Status:    Loss Factors:    Historical Factors:    Risk Reduction Factors:    Total Time spent with patient: 30 minutes Principal Problem: MDD (major depressive disorder), recurrent severe, without psychosis Diagnosis:   Patient Active Problem List   Diagnosis Date Noted  . MDD (major depressive disorder), recurrent severe, without psychosis [F33.2] 08/08/2015  . Cocaine use disorder, severe, dependence [F14.20] 08/08/2015  . Cigar smoker motivated to quit [F17.290] 07/06/2014  . Substance abuse [F19.10] 07/06/2014  . Pregnancy [Z33.1] 06/05/2014     Continued Clinical Symptoms:  Alcohol Use Disorder Identification Test Final Score (AUDIT): 0 The "Alcohol Use Disorders Identification Test", Guidelines for Use in Primary Care, Second Edition.  World Pharmacologist [facility]). Score between 0-7:  no or low risk or alcohol related problems. Score between 8-15:  moderate risk of alcohol related problems. Score between 16-19:  high risk of alcohol related problems. Score 20 or above:  warrants further diagnostic evaluation for alcohol dependence and treatment.   CLINICAL FACTORS:   Alcohol/Substance Abuse/Dependencies Previous Psychiatric Diagnoses and Treatments   Musculoskeletal: Strength & Muscle Tone: within normal limits Gait & Station: normal Patient leans: N/A  Psychiatric Specialty Exam: Physical Exam  Review of Systems  Psychiatric/Behavioral: Positive for substance abuse.  All other systems reviewed and are negative.   Blood pressure 135/96, pulse 77, temperature 98.4 F (36.9 C), temperature source Oral, resp. rate 18, height 5\' 3"  (1.6 m), weight 59.421 kg (131 lb), last menstrual period 06/14/2015, not currently breastfeeding.Body mass index is 23.21 kg/(m^2).  General Appearance: Fairly Groomed  Engineer, water::  Fair  Speech:  Normal Rate   Volume:  Increased  Mood:  Anxious and Irritable  Affect:  Labile  Thought Process:  Goal Directed  Orientation:  Full (Time, Place, and Person)  Thought Content:  Rumination  Suicidal Thoughts:  No  Homicidal Thoughts:  No  Memory:  Immediate;   Fair Recent;   Fair Remote;   Fair  Judgement:  Impaired  Insight:  Shallow  Psychomotor Activity:  Increased and Restlessness  Concentration:  Poor  Recall:  [employer] of Knowledge:Fair  Language: Fair  Akathisia:  No  Handed:  Right  AIMS (if indicated):     Assets:  Communication Skills  Sleep:     Cognition: WNL  ADL's:  Intact     COGNITIVE FEATURES THAT CONTRIBUTE TO RISK:  Closed-mindedness, Polarized thinking and Thought constriction (tunnel vision)    SUICIDE RISK:   Moderate:  Frequent suicidal ideation with limited intensity, and duration, some specificity in terms of plans, no associated intent, good self-control, limited dysphoria/symptomatology, some risk factors present, and identifiable protective factors, including available and accessible social support.  PLAN OF CARE: Patient presented to [facility] with SI . Pt was IVCed for the same reason. However today pt reports she wants to withdraw her comments , appears agitated , demanding discharge from the hospital. Pt provided support and counseling. Pt at this time denies wanting to be on any medications. Pt to be observed on the unit . Patient will benefit from inpatient treatment and stabilization.  Estimated length of stay is 5-7 days.  Reviewed past medical records,treatment plan.  Will continue to monitor vitals ,medication compliance and treatment side effects while patient is here.  Will monitor for medical issues as well as call consult as needed.  Reviewed  labs CBC,CMP,Pregnancy test,UDS,BAL. Will get TSH. CSW will start working on disposition.  Patient to participate in therapeutic milieu .       Medical Decision Making:  New problem, with additional work  up planned, Review of Psycho-Social Stressors (1), Decision to obtain old records (1), Review and summation of old records (2), Established Problem, Worsening (2), Review of Last Therapy Session (1), Review of Medication Regimen & Side Effects (2) and Review of New Medication or Change in Dosage (2)  I certify that inpatient services furnished can reasonably be expected to improve the patient's condition.   [name] md 08/08/2015, 12:24 PM

## 2015-08-08 NOTE — BHH Counselor (Signed)
Adult Comprehensive Assessment  Patient ID: [facility], female   DOB: 07-Aug-1976, 39 y.o.   MRN: [id number]  Information Source: Information source: Patient  Current Stressors:  Educational / Learning stressors:  (patient was paranoid, irritable and declined to answer) Employment / Job issues:  (patient was paranoid, irritable and declined to answer) Family Relationships:  (patient was paranoid, irritable and declined to answer) Financial / Lack of resources (include bankruptcy):  (patient was paranoid, irritable and declined to answer) Housing / Lack of housing:  (patient was paranoid, irritable and declined to answer) Physical health (include injuries & life threatening diseases):  (patient was paranoid, irritable and declined to answer) Social relationships:  (patient was paranoid, irritable and declined to answer) Substance abuse:  (reports history of past drug abuse ("years ago")) Bereavement / Loss:  (patient was paranoid, irritable and declined to answer)  Living/Environment/Situation:  Living Arrangements:  (patient was paranoid, irritable and declined to answer)  Family History:    patient was paranoid, irritable and declined to answer  Childhood History:    patient was paranoid, irritable and declined to answer  Education:    patient was paranoid, irritable and declined to answer  Employment/Work Situation:   What is the longest time patient has a held a job?:  (patient was paranoid, irritable and declined to answer) Where was the patient employed at that time?:  (patient was paranoid, irritable and declined to answer)  Financial Resources:   Financial resources: Medicaid Does patient have a Programmer, applications or guardian?: No  Alcohol/Substance Abuse:   What has been your use of drugs/alcohol within the last 12 months?:  (patient was paranoid, irritable and declined to answer)  Social Support System:   Describe Community Support System:  (patient was  paranoid, irritable and declined to answer) Type of faith/religion:  (patient was paranoid, irritable and declined to answer) How does patient's faith help to cope with current illness?:  (patient was paranoid, irritable and declined to answer)  Leisure/Recreation:     patient was paranoid, irritable and declined to answer Strengths/Needs:     patient was paranoid, irritable and declined to answer  Discharge Plan:    PLans to return home   Summary/Recommendations:   Summary and Recommendations (to be completed by the evaluator):  (patient was paranoid, irritable and declined to answer most questions- she is focused on speaking with the Doctor and getting out of here- )   CSW met with patient who was unwilling to answer most questions- she reports, "I said some things I shouldn't have said". She admits to a history (past/years ago) of drug addiction. She appears paranoid about being here and all questions being asked- "I'm afraid to answer any more questions because that's what got me locked up in a padded room yesterday". She does mention that she was at [facility] in the past and was scheduled for her 1st appointment at [facility] on yesterday but missed it because she was here- " I want to get back on my trazadone".  Patient declined to sign ROI or other consents at this time.   Deborah Melendez. 08/08/2015

## 2015-08-08 NOTE — Progress Notes (Signed)
Patient attended karaoke group tonight.  

## 2015-08-08 NOTE — Progress Notes (Signed)
D: Pt has appropriate affect and pleasant mood.  Pt reports she had a "great" visit with her best friend.  She reports her goal is "to make it to [employer] and sing and participate and I took my medicine, I laughed a lot tonight."  Pt denies SI/HI, denies hallucinations, denies pain.  Pt has been visible in milieu interacting with peers and staff appropriately.  She attended evening group and reported that she sang during [employer]. A: Introduced self to pt.  Met with pt 1:1 and provided support and encouragement.  Actively listened to pt.  Medications administered per order.  PRN medication administered for anxiety. R: Pt is compliant with medications.  Pt verbally contracts for safety and reports that she will notify staff of needs and concerns.  Will continue to monitor and assess.

## 2015-08-08 NOTE — Progress Notes (Signed)
39 year old female pt admitted on involuntary basis. Pt reports on admission that the comments she made were blown out of proportion. Pt denies any SI on admission and able to contract for safety on the unit. Pt reports that she was in [facility] and recently got out and was to go to [place] for evaluation. Pt did report she has been having some issues with anxiety as well as some depression and spoke about how she had been on drugs for over 20 years and is getting adjusted to life without them. Pt reports that she does not wish to be here and has nothing to work on while she is here. Pt reports that she is not interested in getting treatment here and instead wishes to get to [facility] for treatment there instead. Pt reports she made a mistake with the comments she made. Pt was oriented to the unit and safety maintained.

## 2015-08-08 NOTE — Progress Notes (Addendum)
D:Pt presents with increased anxiety at start of shift. She reports she does not want to be here and is refusing all medication. "I am getting a lawyer so I can get out of here." She denies SI/HI, Denies A/V/H. Denies physical pain. As shift progressed pt more receptive to treatment and medication. Per patient self inventory form patient reports she slept good last night with the use of sleep medication. She reports fair appetite, normal energy level, good concentration. She rates depression 1/10, hopelessness 1/10, anxiety 1/10- all on 1-10 scale, 10 being the worse. She denies physical pain.  A:Special checks q 15 mins in place for safety. Encouragement and support provided.  R:Safety maintained will continue to monitor.

## 2015-08-08 NOTE — H&P (Signed)
Psychiatric Admission Assessment Adult  Patient Identification: Deborah Melendez MRN:  [id number] Date of Evaluation:  08/08/2015 Chief Complaint:  MDD recurrent severe with psychotic features Principal Diagnosis: MDD (major depressive disorder), recurrent severe, without psychosis Diagnosis:   Patient Active Problem List   Diagnosis Date Noted  . MDD (major depressive disorder), recurrent severe, without psychosis [F33.2] 08/08/2015  . Cocaine use disorder, severe, dependence [F14.20] 08/08/2015  . Cigar smoker motivated to quit [F17.290] 07/06/2014  . Substance abuse [F19.10] 07/06/2014  . Pregnancy [Z33.1] 06/05/2014   History of Present Illness: Deborah Melendez is a 39 yo female who initially presented to [facility] with c/o suicidal ideations.  She was seen today, patient was upset and irritable.  She stated that her words were taken out of context.  When asked if she had suicidal thoughts, what she heard was did she ever have suicidal thoughts EVER in her life.  Her answer was, "of course.  I've been on crack for 20 years.  I have a drug problem.  I was referred by [facility] to go to [facility] to get help with substance abuse.  Now I end up here"  Patient is upset that she has no idea that her words would get her here.  She vehemenently denies any SI.  She states that she has a baby and she is worried about who will care for her.   Per TTS: Deborah Melendez is an 39 y.o. female presenting to [facility]. Patient has complaints of increased depression and suicidal thoughts. Patient stating, "I don't know if I want to live anymore". Onset of suicidal thoughts started Monday afternoon after she was discharged from [place]. Patient spent 3 weeks at [facility] for drug rehabilitation (crack cocaine). Last used crack cocaine 3 weeks ago. Sts that she did not receive any of her "psych meds" and wants to get back on them. Patient sts she was taking Trazodone (dosage/frequency unk) and "Somethig to help me  sleep". Patient is suicidal because she doesn't have her meds. She has a plan to cut her wrist, "Just like I did before". Patient has access to kitchen knives. She also tried to commit suicide by cutting her wrist years ago. She recalls the trigger for her previous suicide attempt as "depression". She denies HI and AVH's. Patient does not have a outpatient mental health provider. She was hospitalized at [facility] in the past. Patient is requesting inpatient hospitalization today.   Elements:  Location:  suicidal ideations. Quality:  helpless, anxiety, irritability. Duration:  20 years. Context:  see HPI. Associated Signs/Symptoms: Depression Symptoms:  depressed mood, insomnia, fatigue, hopelessness, anxiety, (Hypo) Manic Symptoms:  Irritable Mood, Labiality of Mood, Anxiety Symptoms:  Excessive Worry, Psychotic Symptoms:  NA PTSD Symptoms: NA Total Time spent with patient: 45 minutes   Past Medical History:  Past Medical History  Diagnosis Date  . MRSA (methicillin resistant Staphylococcus aureus)     surgery on finger 3 years ago    Past Surgical History  Procedure Laterality Date  . Finger surgery     Family History:  Family History  Problem Relation Age of Onset  . Diabetes Mother   . Hypertension Mother    Social History:  History  Alcohol Use No     History  Drug Use No    Comment: 30 days sober, just completed treatment at [facility] 8/11    Social History   Social History  . Marital Status: Single    Spouse Name: N/A  . Number of Children: N/A  .  Years of Education: N/A   Social History Main Topics  . Smoking status: Current Every Day Smoker -- 0.50 packs/day    Types: Cigarettes  . Smokeless tobacco: None  . Alcohol Use: No  . Drug Use: No     Comment: 30 days sober, just completed treatment at [facility] 8/11  . Sexual Activity: Not Asked   Other Topics Concern  . None   Social History Narrative   Additional Social  History:  Musculoskeletal: Strength & Muscle Tone: within normal limits Gait & Station: normal Patient leans: N/A  Psychiatric Specialty Exam: Physical Exam  Vitals reviewed. Respiratory: No respiratory distress.  GI: She exhibits no distension.  Psychiatric: Her mood appears anxious. Her affect is blunt and labile.    ROS  Blood pressure 135/96, pulse 77, temperature 98.4 F (36.9 C), temperature source Oral, resp. rate 18, height 5' 3"  (1.6 m), weight 59.421 kg (131 lb), last menstrual period 06/14/2015, not currently breastfeeding.Body mass index is 23.21 kg/(m^2).   General Appearance: Fairly Groomed  Engineer, water:: Fair  Speech: Normal Rate  Volume: Increased  Mood: Anxious and Irritable  Affect: Labile  Thought Process: Goal Directed  Orientation: Full (Time, Place, and Person)  Thought Content: Rumination  Suicidal Thoughts: No  Homicidal Thoughts: No  Memory: Immediate; Fair Recent; Fair Remote; Fair  Judgement: Impaired  Insight: Shallow  Psychomotor Activity: Increased and Restlessness  Concentration: Poor  Recall: [employer] of Knowledge:Fair  Language: Fair  Akathisia: No  Handed: Right  AIMS (if indicated):    Assets: Communication Skills  Sleep:    Cognition: WNL  ADL's: Intact       Risk to Self: Is patient at risk for suicide?: Yes What has been your use of drugs/alcohol within the last 12 months?:  (patient was paranoid, irritable and declined to answer) Risk to Others:   Prior Inpatient Therapy:   Prior Outpatient Therapy:    Alcohol Screening: 1. How often do you have a drink containing alcohol?: Never 9. Have you or someone else been injured as a result of your drinking?: No 10. Has a relative or friend or a doctor or another health worker been concerned about your drinking or suggested you cut down?: No Alcohol Use Disorder Identification Test Final Score (AUDIT): 0 Brief Intervention:  AUDIT score less than 7 or less-screening does not suggest unhealthy drinking-brief intervention not indicated  Allergies:  No Known Allergies Lab Results:  Results for orders placed or performed during the hospital encounter of 08/07/15 (from the past 48 hour(s))  Comprehensive metabolic panel     Status: Abnormal   Collection Time: 08/07/15 10:44 AM  Result Value Ref Range   Sodium 138 135 - 145 mmol/L   Potassium 3.8 3.5 - 5.1 mmol/L   Chloride 105 101 - 111 mmol/L   CO2 23 22 - 32 mmol/L   Glucose, Bld 83 65 - 99 mg/dL   BUN 17 6 - 20 mg/dL   Creatinine, Ser 0.90 0.44 - 1.00 mg/dL   Calcium 9.4 8.9 - 10.3 mg/dL   Total Protein 7.7 6.5 - 8.1 g/dL   Albumin 4.1 3.5 - 5.0 g/dL   AST 18 15 - 41 U/L   ALT 13 (L) 14 - 54 U/L   Alkaline Phosphatase 54 38 - 126 U/L   Total Bilirubin 1.1 0.3 - 1.2 mg/dL   GFR calc non Af Amer >60 >60 mL/min   GFR calc Af Amer >60 >60 mL/min    Comment: (NOTE)  The eGFR has been calculated using the CKD EPI equation. This calculation has not been validated in all clinical situations. eGFR's persistently <60 mL/min signify possible Chronic Kidney Disease.    Anion gap 10 5 - 15  Ethanol (ETOH)     Status: None   Collection Time: 08/07/15 10:44 AM  Result Value Ref Range   Alcohol, Ethyl (B) <5 <5 mg/dL    Comment:        LOWEST DETECTABLE LIMIT FOR SERUM ALCOHOL IS 5 mg/dL FOR MEDICAL PURPOSES ONLY   Salicylate level     Status: None   Collection Time: 08/07/15 10:44 AM  Result Value Ref Range   Salicylate Lvl <1.6 2.8 - 30.0 mg/dL  Acetaminophen level     Status: Abnormal   Collection Time: 08/07/15 10:44 AM  Result Value Ref Range   Acetaminophen (Tylenol), Serum <10 (L) 10 - 30 ug/mL    Comment:        THERAPEUTIC CONCENTRATIONS VARY SIGNIFICANTLY. A RANGE OF 10-30 ug/mL MAY BE AN EFFECTIVE CONCENTRATION FOR MANY PATIENTS. HOWEVER, SOME ARE BEST TREATED AT CONCENTRATIONS OUTSIDE THIS RANGE. ACETAMINOPHEN CONCENTRATIONS >150 ug/mL  AT 4 HOURS AFTER INGESTION AND >50 ug/mL AT 12 HOURS AFTER INGESTION ARE OFTEN ASSOCIATED WITH TOXIC REACTIONS.   CBC     Status: Abnormal   Collection Time: 08/07/15 10:44 AM  Result Value Ref Range   WBC 5.4 4.0 - 10.5 K/uL   RBC 4.26 3.87 - 5.11 MIL/uL   Hemoglobin 12.7 12.0 - 15.0 g/dL   HCT 38.3 36.0 - 46.0 %   MCV 89.9 78.0 - 100.0 fL   MCH 29.8 26.0 - 34.0 pg   MCHC 33.2 30.0 - 36.0 g/dL   RDW 17.1 (H) 11.5 - 15.5 %   Platelets 247 150 - 400 K/uL  hCG, quantitative, pregnancy     Status: None   Collection Time: 08/07/15 10:44 AM  Result Value Ref Range   hCG, Beta Chain, Quant, S <1 <5 mIU/mL    Comment:          GEST. AGE      CONC.  (mIU/mL)   <=1 WEEK        5 - 50     2 WEEKS       50 - 500     3 WEEKS       100 - 10,000     4 WEEKS     1,000 - 30,000     5 WEEKS     3,500 - 115,000   6-8 WEEKS     12,000 - 270,000    12 WEEKS     15,000 - 220,000        FEMALE AND NON-PREGNANT FEMALE:     LESS THAN 5 mIU/mL   Urine rapid drug screen (hosp performed) (Not at [facility])     Status: None   Collection Time: 08/07/15 11:02 AM  Result Value Ref Range   Opiates NONE DETECTED NONE DETECTED   Cocaine NONE DETECTED NONE DETECTED   Benzodiazepines NONE DETECTED NONE DETECTED   Amphetamines NONE DETECTED NONE DETECTED   Tetrahydrocannabinol NONE DETECTED NONE DETECTED   Barbiturates NONE DETECTED NONE DETECTED    Comment:        DRUG SCREEN FOR MEDICAL PURPOSES ONLY.  IF CONFIRMATION IS NEEDED FOR ANY PURPOSE, NOTIFY LAB WITHIN 5 DAYS.        LOWEST DETECTABLE LIMITS FOR URINE DRUG SCREEN Drug Class  Cutoff (ng/mL) Amphetamine      1000 Barbiturate      200 Benzodiazepine   784 Tricyclics       696 Opiates          300 Cocaine          300 THC              50   Urinalysis, Routine w reflex microscopic (not at [facility])     Status: Abnormal   Collection Time: 08/07/15  3:02 PM  Result Value Ref Range   Color, Urine YELLOW YELLOW   APPearance CLOUDY (A) CLEAR    Specific Gravity, Urine 1.021 1.005 - 1.030   pH 6.5 5.0 - 8.0   Glucose, UA NEGATIVE NEGATIVE mg/dL   Hgb urine dipstick NEGATIVE NEGATIVE   Bilirubin Urine NEGATIVE NEGATIVE   Ketones, ur NEGATIVE NEGATIVE mg/dL   Protein, ur NEGATIVE NEGATIVE mg/dL   Urobilinogen, UA 0.2 0.0 - 1.0 mg/dL   Nitrite NEGATIVE NEGATIVE   Leukocytes, UA NEGATIVE NEGATIVE    Comment: MICROSCOPIC NOT DONE ON URINES WITH NEGATIVE PROTEIN, BLOOD, LEUKOCYTES, NITRITE, OR GLUCOSE <1000 mg/dL.   Current Medications: Current Facility-Administered Medications  Medication Dose Route Frequency Provider Last Rate Last Dose  . acetaminophen (TYLENOL) tablet 650 mg  650 mg Oral Q6H PRN Delfin Gant, NP      . alum & mag hydroxide-simeth (MAALOX/MYLANTA) 200-200-20 MG/5ML suspension 30 mL  30 mL Oral Q4H PRN Delfin Gant, NP      . FLUoxetine (PROZAC) capsule 10 mg  10 mg Oral Daily Delfin Gant, NP   10 mg at 08/08/15 0833  . FLUoxetine (PROZAC) capsule 10 mg  10 mg Oral Once Kerrie Buffalo, NP      . hydrOXYzine (ATARAX/VISTARIL) tablet 25 mg  25 mg Oral Q6H PRN Kerrie Buffalo, NP   25 mg at 08/08/15 1231  . magnesium hydroxide (MILK OF MAGNESIA) suspension 30 mL  30 mL Oral Daily PRN Delfin Gant, NP      . traZODone (DESYREL) tablet 50 mg  50 mg Oral QHS Delfin Gant, NP   50 mg at 08/08/15 0230   PTA Medications: Prescriptions prior to admission  Medication Sig Dispense Refill Last Dose  . clotrimazole (LOTRIMIN) 1 % cream Apply to all toenails 2 times daily until symptoms cleared (Patient not taking: Reported on 07/15/2015) 15 g 0 Not Taking at Unknown time  . doxycycline (VIBRAMYCIN) 100 MG capsule Take 100 mg by mouth 2 (two) times daily.  0   . HYDROcodone-acetaminophen (NORCO/VICODIN) 5-325 MG per tablet TAKE 1 TABLET BY MOUTH  EVERY 4 HOURS AS NEEDED  0   . hydrOXYzine (ATARAX/VISTARIL) 25 MG tablet Take 1 tablet (25 mg total) by mouth every 6 (six) hours. (Patient not taking:  Reported on 07/15/2015) 12 tablet 0 Not Taking at Unknown time  . ibuprofen (ADVIL,MOTRIN) 800 MG tablet TAKE 1 TABLET BY MOUTH EVERY 8 HOURS AS NEEDED FOR  MILD PAIN  0   . metroNIDAZOLE (FLAGYL) 500 MG tablet TAKE 1 TABLET BY MOUTH TWICE DAILY. DO NOT DRINK ALCOHOL WITH THIS MEDICINE  0   . ondansetron (ZOFRAN-ODT) 4 MG disintegrating tablet DISSOLVE 1 TABLET IN MOUTH EVERY 8 HOURS AS NEEDED FOR NAUSEA OR VOMITING  0   . varenicline (CHANTIX) 0.5 MG tablet Take 1 tablet (0.5 mg total) by mouth 2 (two) times daily. (Patient not taking: Reported on 07/15/2015) 60 tablet 2 Not Taking at Unknown time  Previous Psychotropic Medications: Yes   Substance Abuse History in the last 12 months:  Yes.    Consequences of Substance Abuse: agitation, anxiety, irritability  Results for orders placed or performed during the hospital encounter of 08/07/15 (from the past 72 hour(s))  Comprehensive metabolic panel     Status: Abnormal   Collection Time: 08/07/15 10:44 AM  Result Value Ref Range   Sodium 138 135 - 145 mmol/L   Potassium 3.8 3.5 - 5.1 mmol/L   Chloride 105 101 - 111 mmol/L   CO2 23 22 - 32 mmol/L   Glucose, Bld 83 65 - 99 mg/dL   BUN 17 6 - 20 mg/dL   Creatinine, Ser 0.90 0.44 - 1.00 mg/dL   Calcium 9.4 8.9 - 10.3 mg/dL   Total Protein 7.7 6.5 - 8.1 g/dL   Albumin 4.1 3.5 - 5.0 g/dL   AST 18 15 - 41 U/L   ALT 13 (L) 14 - 54 U/L   Alkaline Phosphatase 54 38 - 126 U/L   Total Bilirubin 1.1 0.3 - 1.2 mg/dL   GFR calc non Af Amer >60 >60 mL/min   GFR calc Af Amer >60 >60 mL/min    Comment: (NOTE) The eGFR has been calculated using the CKD EPI equation. This calculation has not been validated in all clinical situations. eGFR's persistently <60 mL/min signify possible Chronic Kidney Disease.    Anion gap 10 5 - 15  Ethanol (ETOH)     Status: None   Collection Time: 08/07/15 10:44 AM  Result Value Ref Range   Alcohol, Ethyl (B) <5 <5 mg/dL    Comment:        LOWEST DETECTABLE  LIMIT FOR SERUM ALCOHOL IS 5 mg/dL FOR MEDICAL PURPOSES ONLY   Salicylate level     Status: None   Collection Time: 08/07/15 10:44 AM  Result Value Ref Range   Salicylate Lvl <9.9 2.8 - 30.0 mg/dL  Acetaminophen level     Status: Abnormal   Collection Time: 08/07/15 10:44 AM  Result Value Ref Range   Acetaminophen (Tylenol), Serum <10 (L) 10 - 30 ug/mL    Comment:        THERAPEUTIC CONCENTRATIONS VARY SIGNIFICANTLY. A RANGE OF 10-30 ug/mL MAY BE AN EFFECTIVE CONCENTRATION FOR MANY PATIENTS. HOWEVER, SOME ARE BEST TREATED AT CONCENTRATIONS OUTSIDE THIS RANGE. ACETAMINOPHEN CONCENTRATIONS >150 ug/mL AT 4 HOURS AFTER INGESTION AND >50 ug/mL AT 12 HOURS AFTER INGESTION ARE OFTEN ASSOCIATED WITH TOXIC REACTIONS.   CBC     Status: Abnormal   Collection Time: 08/07/15 10:44 AM  Result Value Ref Range   WBC 5.4 4.0 - 10.5 K/uL   RBC 4.26 3.87 - 5.11 MIL/uL   Hemoglobin 12.7 12.0 - 15.0 g/dL   HCT 38.3 36.0 - 46.0 %   MCV 89.9 78.0 - 100.0 fL   MCH 29.8 26.0 - 34.0 pg   MCHC 33.2 30.0 - 36.0 g/dL   RDW 17.1 (H) 11.5 - 15.5 %   Platelets 247 150 - 400 K/uL  hCG, quantitative, pregnancy     Status: None   Collection Time: 08/07/15 10:44 AM  Result Value Ref Range   hCG, Beta Chain, Quant, S <1 <5 mIU/mL    Comment:          GEST. AGE      CONC.  (mIU/mL)   <=1 WEEK        5 - 50     2 WEEKS       50 -  500     3 WEEKS       100 - 10,000     4 WEEKS     1,000 - 30,000     5 WEEKS     3,500 - 115,000   6-8 WEEKS     12,000 - 270,000    12 WEEKS     15,000 - 220,000        FEMALE AND NON-PREGNANT FEMALE:     LESS THAN 5 mIU/mL   Urine rapid drug screen (hosp performed) (Not at [facility])     Status: None   Collection Time: 08/07/15 11:02 AM  Result Value Ref Range   Opiates NONE DETECTED NONE DETECTED   Cocaine NONE DETECTED NONE DETECTED   Benzodiazepines NONE DETECTED NONE DETECTED   Amphetamines NONE DETECTED NONE DETECTED   Tetrahydrocannabinol NONE DETECTED NONE  DETECTED   Barbiturates NONE DETECTED NONE DETECTED    Comment:        DRUG SCREEN FOR MEDICAL PURPOSES ONLY.  IF CONFIRMATION IS NEEDED FOR ANY PURPOSE, NOTIFY LAB WITHIN 5 DAYS.        LOWEST DETECTABLE LIMITS FOR URINE DRUG SCREEN Drug Class       Cutoff (ng/mL) Amphetamine      1000 Barbiturate      200 Benzodiazepine   176 Tricyclics       160 Opiates          300 Cocaine          300 THC              50   Urinalysis, Routine w reflex microscopic (not at [facility])     Status: Abnormal   Collection Time: 08/07/15  3:02 PM  Result Value Ref Range   Color, Urine YELLOW YELLOW   APPearance CLOUDY (A) CLEAR   Specific Gravity, Urine 1.021 1.005 - 1.030   pH 6.5 5.0 - 8.0   Glucose, UA NEGATIVE NEGATIVE mg/dL   Hgb urine dipstick NEGATIVE NEGATIVE   Bilirubin Urine NEGATIVE NEGATIVE   Ketones, ur NEGATIVE NEGATIVE mg/dL   Protein, ur NEGATIVE NEGATIVE mg/dL   Urobilinogen, UA 0.2 0.0 - 1.0 mg/dL   Nitrite NEGATIVE NEGATIVE   Leukocytes, UA NEGATIVE NEGATIVE    Comment: MICROSCOPIC NOT DONE ON URINES WITH NEGATIVE PROTEIN, BLOOD, LEUKOCYTES, NITRITE, OR GLUCOSE <1000 mg/dL.    Observation Level/Precautions:  15 minute checks  Laboratory:  per ED  Psychotherapy:  Group  Medications:  As per medlist  Consultations:  As needed  Discharge Concerns:  Safety  Estimated LOS:  3-5 days  Other:     Psychological Evaluations: Yes   Treatment Plan Summary: Admit for crisis management and mood stabilization.   Counseling provided, health education to help patient understand why observation status to ensure safety Medication management to re-stabilize current mood symptoms.  Prozac 10 mg daily, Hydroxyzine 25 mg Q6H Group counseling sessions for coping skills Medical consults as needed Review and reinstate any pertinent home medications for other health problems  Medical Decision Making:  Review of Psycho-Social Stressors (1), Discuss test with performing physician (1) and  Decision to obtain old records (1)  I certify that inpatient services furnished can reasonably be expected to improve the patient's condition.   Freda Munro May Raquel Sayres AGNP-BC 8/11/201612:48 PM

## 2015-08-08 NOTE — ED Notes (Signed)
IVC paper received.

## 2015-08-08 NOTE — Tx Team (Signed)
Initial Interdisciplinary Treatment Plan   PATIENT STRESSORS: Medication change or noncompliance Substance abuse   PATIENT STRENGTHS: Ability for insight Average or above average intelligence Capable of independent living General fund of knowledge Motivation for treatment/growth Supportive family/friends   PROBLEM LIST: Problem List/Patient Goals Date to be addressed Date deferred Reason deferred Estimated date of resolution  "I don't have anything to work on" 08/08/15     "I'm not trying to kill myself" 08/08/15     Depression 08/08/15     Suicidal Ideation 08/08/15                                    DISCHARGE CRITERIA:  Ability to meet basic life and health needs Improved stabilization in mood, thinking, and/or behavior Verbal commitment to aftercare and medication compliance  PRELIMINARY DISCHARGE PLAN: Attend aftercare/continuing care group Return to previous living arrangement  PATIENT/FAMIILY INVOLVEMENT: This treatment plan has been presented to and reviewed with the patient, Deborah Melendez, and/or family member, .  The patient and family have been given the opportunity to ask questions and make suggestions.  [place], [place] 08/08/2015, 2:16 AM

## 2015-08-08 NOTE — ED Notes (Signed)
Pt transported to [facility] by [employer] for continuation of specialized care. Pt left in no acute distress. Belongings signed for and given to [employer] officer. Pt left in no acute distress. 

## 2015-08-08 NOTE — Tx Team (Signed)
Interdisciplinary Treatment Plan Update (Adult) Date: 08/08/2015   Date: 08/08/2015 1:34 PM  Progress in Treatment:  Attending groups: No Participating in groups: No Taking medication as prescribed: Yes  Tolerating medication: Yes  Family/Significant othe contact made: No, CSW assessing for appropriate contact. Patient understands diagnosis: Continuing to assess Discussing patient identified problems/goals with staff: Yes  Medical problems stabilized or resolved: Yes  Denies suicidal/homicidal ideation: Yes Patient has not harmed self or Others: Yes   New problem(s) identified: None identified at this time.   Discharge Plan or Barriers: Pt will return home and follow-up with [facility]  Additional comments: Pt is insistent that she does not need to be here and would like to be discharged as soon as possible.  Reason for Continuation of Hospitalization:  Depression Medication stabilization Suicidal Ideation  Estimated length of stay: 3-5 days  Review of initial/current patient goals per problem list:   1.  Goal(s): Patient will participate in aftercare plan  Met:  Yes  Target date: 3-5 days from date of admission   As evidenced by: Patient will participate within aftercare plan AEB aftercare provider and housing plan at discharge being identified.   08/08/15: Pt will return home and follow-up at [facility].  2.  Goal (s): Patient will exhibit decreased depressive symptoms and suicidal ideations.  Met:  Yes  Target date: 3-5 days from date of admission   As evidenced by: Patient will utilize self rating of depression at 3 or below and demonstrate decreased signs of depression or be deemed stable for discharge by MD.  08/08/15: Pt denies SI and depressive symptoms.  4.  Goal(s): Patient will demonstrate decreased signs of withdrawal due to substance abuse  Met:  Yes  Target date: 3-5 days from date of admission   As evidenced by: Patient will produce a CIWA/COWS score of  0, have stable vitals signs, and no symptoms of withdrawal  08/08/15: Per Pt and chart review, no withdrawal symptoms noted.   Attendees:  Patient:    Family:    Physician: Dr. Sabra Heck, MD  08/08/2015 1:00 PM  Nursing: Lars Pinks, RN Case manager  08/08/2015 1:00 PM  Clinical Social Worker Peri Maris, Latanya Presser, MSW 08/08/2015 1:00 PM  Other: Lucinda Dell, Beverly Sessions [place] 08/08/2015 1:00 PM  Clinical:  Eben Burow, RN; Festus Aloe, RN 08/08/2015 1:00 PM  Other:  08/08/2015 1:00 PM  Other:      Norman Clay MSW

## 2015-08-08 NOTE — [facility] Group Notes (Signed)
[facility] Group Notes:  (Nursing/MHT/Case Management/Adjunct)  Date:  08/08/2015  Time:  0930 Type of Therapy:  Nurse Education  Participation Level:  Did Not Attend    Forrestine Him 08/08/2015, 11:16 AM

## 2015-08-09 MED ORDER — FLUOXETINE HCL 20 MG PO CAPS
20.0000 mg | ORAL_CAPSULE | Freq: Every day | ORAL | Status: DC
  Administered 2015-08-10: 20 mg via ORAL
  Filled 2015-08-09: qty 3
  Filled 2015-08-09 (×2): qty 1
  Filled 2015-08-09: qty 3

## 2015-08-09 MED ORDER — LOPERAMIDE HCL 2 MG PO CAPS
2.0000 mg | ORAL_CAPSULE | ORAL | Status: DC | PRN
  Administered 2015-08-09: 2 mg via ORAL
  Filled 2015-08-09: qty 1

## 2015-08-09 MED ORDER — DIPHENHYDRAMINE HCL 25 MG PO CAPS
25.0000 mg | ORAL_CAPSULE | Freq: Once | ORAL | Status: AC
  Administered 2015-08-09: 25 mg via ORAL
  Filled 2015-08-09 (×2): qty 1

## 2015-08-09 NOTE — [facility] Group Notes (Signed)
[facility] LCSW Aftercare Discharge Planning Group Note  08/09/2015 8:45 AM  Participation Quality: Alert, Appropriate and Oriented  Mood/Affect: Flat  Depression Rating: 0  Anxiety Rating: 0  Thoughts of Suicide: Pt denies SI/HI  Will you contract for safety? Yes  Current AVH: Pt denies  Plan for Discharge/Comments: Pt attended discharge planning group and actively participated in group. CSW discussed suicide prevention education with the group and encouraged them to discuss discharge planning and any relevant barriers. Pt reports that she will be following up at [facility] and returning home at [place]. No needs voiced at this time.  Transportation Means: Pt reports access to transportation  Supports: No supports mentioned at this time  Peri Maris, [place] 08/09/2015 9:26 AM

## 2015-08-09 NOTE — Progress Notes (Signed)
Patient ID: Deborah Melendez, female   DOB: [birth year], 39 y.o.   MRN: [id number] D. Patient denies HI,SI, and AVH. States she is ready to go home. Stated "I have been here long enough, I am tired of these people". Patient has no complaints of pain or other discomfort.  A. Patient given support. Given meds as ordered.Encouraged to continue attending groups.  R. Patient cooperative and pleasant.  Monitored for safety.

## 2015-08-09 NOTE — Progress Notes (Signed)
Recreation Therapy Notes  Date: 08.12.16 Time: 9:30 am Location: [address] Group Room  Group Topic: Stress Management  Goal Area(s) Addresses:  Patient will verbalize importance of using healthy stress management.  Patient will identify positive emotions associated with healthy stress management.   Intervention: Stress Management  Activity :  Guided Automotive engineer.  LRT will introduce and educate the patients on the stress management technique of guided imagery.  A script was used to deliver the technique to the patients.  Patients were asked to follow the script read aloud by the LRT to engage in the stress management technique.  Education:  Stress Management, Discharge Planning.   Education Outcome: Acknowledges edcuation/In group clarification offered/Needs additional education  Clinical Observations/Feedback: Patient did not attend group.   Victorino Sparrow, LRT/CTRS         Victorino Sparrow A 08/09/2015 4:07 PM

## 2015-08-09 NOTE — BHH Suicide Risk Assessment (Signed)
[place] INPATIENT:  Family/Significant Other Suicide Prevention Education  Suicide Prevention Education:  With consent from Pt, CSW contacted friend Zollie Pee [phone]) in attempts to complete suicide prevention education. However, Mr. Doy Mince stated that he felt that Pt's admission was misconstrued and that she has never been suicidal. He reports that he did not want me to review suicide prevention education with him and that he would read any literature that she was discharged with. SPE reviewed with patient and brochure provided. Patient encouraged to return to hospital if having suicidal thoughts, patient verbalized his/her understanding and has no further questions at this time.   Peri Maris, [place] Work [phone]

## 2015-08-09 NOTE — BHH Group Notes (Signed)
[place] LCSW Group Therapy 08/09/2015 1:15pm  Type of Therapy: Group Therapy- Feelings Around Relapse and Recovery  Participation Level: Active   Participation Quality:  Appropriate  Affect:  Appropriate  Cognitive: Alert and Oriented   Insight:  Developing   Engagement in Therapy: Developing/Improving and Engaged   Modes of Intervention: Clarification, Confrontation, Discussion, Education, Exploration, Limit-setting, Orientation, Problem-solving, Rapport Building, Art therapist, Socialization and Support  Summary of Progress/Problems: The topic for today was feelings about relapse. The group discussed what relapse prevention is to them and identified triggers that they are on the path to relapse. Members also processed their feeling towards relapse and were able to relate to common experiences. Group also discussed coping skills that can be used for relapse prevention.  Pt offered insightful participation and was able to identify current and past triggers to her substance use. Pt focused on her need for stability and a trusted support system who can keep her accountable as the most important protective factors against relapse.   Therapeutic Modalities:   Cognitive Behavioral Therapy Solution-Focused Therapy Assertiveness Training Relapse Prevention Therapy    Norman Clay [phone] 08/09/2015 3:00 PM

## 2015-08-09 NOTE — Progress Notes (Signed)
[facility] MD Progress Note  08/09/2015 7:50 PM Deborah Melendez  MRN:  [id number] Subjective:  Patient states " I am feeling great". Denies any depression, and states she was not feeling  suicidal prior to admission . States " I have a long history of using crack, and I need to stop". Denies medication  Side effects. Objective : I have discussed case with treatment team and have met with patient . At this time patient is doing well . She presents euthymic, and denies any neuro-vegetative symptoms of depression or sadness . Denies any Suicidal ideations. No disruptive behaviors on unit. Has been going to groups, participating well, and attended + sang at [place] last evening . Regarding reported suicidal ideations when she came to ED states " I think they misunderstood me.  I definitely do not want to hurt myself ".  Denies medication  Side effects. Denies cravings for cocaine at this time. TSH WNL.     Principal Problem: MDD (major depressive disorder), recurrent severe, without psychosis Diagnosis:   Patient Active Problem List   Diagnosis Date Noted  . MDD (major depressive disorder), recurrent severe, without psychosis [F33.2] 08/08/2015  . Cocaine use disorder, severe, dependence [F14.20] 08/08/2015  . Cigar smoker motivated to quit [F17.290] 07/06/2014  . Substance abuse [F19.10] 07/06/2014  . Pregnancy [Z33.1] 06/05/2014   Total Time spent with patient: 20 minutes   Past Medical History:  Past Medical History  Diagnosis Date  . MRSA (methicillin resistant Staphylococcus aureus)     surgery on finger 3 years ago    Past Surgical History  Procedure Laterality Date  . Finger surgery     Family History:  Family History  Problem Relation Age of Onset  . Diabetes Mother   . Hypertension Mother    Social History:  History  Alcohol Use No     History  Drug Use No    Comment: 30 days sober, just completed treatment at [facility] 8/11    Social History   Social History  .  Marital Status: Single    Spouse Name: N/A  . Number of Children: N/A  . Years of Education: N/A   Social History Main Topics  . Smoking status: Current Every Day Smoker -- 0.50 packs/day    Types: Cigarettes  . Smokeless tobacco: None  . Alcohol Use: No  . Drug Use: No     Comment: 30 days sober, just completed treatment at [facility] 8/11  . Sexual Activity: Not Asked   Other Topics Concern  . None   Social History Narrative   Additional History:    Sleep: Good  Appetite:  Good   Assessment:   Musculoskeletal: Strength & Muscle Tone: within normal limits Gait & Station: normal Patient leans: N/A   Psychiatric Specialty Exam: Physical Exam  ROS- denies headache, denies chest pain, denies SOB  Blood pressure 104/69, pulse 102, temperature 99.1 F (37.3 C), temperature source Oral, resp. rate 18, height _0  (1.6 m), weight 131 lb (59.421 kg), last menstrual period 06/14/2015, not currently breastfeeding.Body mass index is 23.21 kg/(m^2).  General Appearance: Fairly Groomed  Engineer, water::  Good  Speech:  Normal Rate  Volume:  Normal  Mood:  Euthymic- denies depression  Affect:  Appropriate- affect bright   Thought Process:  Linear  Orientation:  Full (Time, Place, and Person)  Thought Content:  Denies hallucinations, no delusions  Suicidal Thoughts:  No-   Homicidal Thoughts:  No  Memory:  recent and remote grossly intact  Judgement:  Other:  improved  Insight:  improved   Psychomotor Activity:  Normal  Concentration:  Good  Recall:  Good  Fund of Knowledge:Good  Language: Good  Akathisia:  Negative  Handed:  Right  AIMS (if indicated):     Assets:  Communication Skills Desire for Improvement Resilience  ADL's:  improved  Cognition: WNL  Sleep:        Current Medications: Current Facility-Administered Medications  Medication Dose Route Frequency Provider Last Rate Last Dose  . acetaminophen (TYLENOL) tablet 650 mg  650 mg Oral Q6H PRN Delfin Gant, NP      . alum & mag hydroxide-simeth (MAALOX/MYLANTA) 200-200-20 MG/5ML suspension 30 mL  30 mL Oral Q4H PRN Delfin Gant, NP      . FLUoxetine (PROZAC) capsule 10 mg  10 mg Oral Daily Delfin Gant, NP   10 mg at 08/09/15 0902  . hydrOXYzine (ATARAX/VISTARIL) tablet 25 mg  25 mg Oral Q6H PRN Kerrie Buffalo, NP   25 mg at 08/09/15 1532  . loperamide (IMODIUM) capsule 2 mg  2 mg Oral PRN Niel Hummer, NP      . magnesium hydroxide (MILK OF MAGNESIA) suspension 30 mL  30 mL Oral Daily PRN Delfin Gant, NP      . nicotine (NICODERM CQ - dosed in mg/24 hours) patch 21 mg  21 mg Transdermal Daily Jenne Campus, MD   21 mg at 08/09/15 0903  . traZODone (DESYREL) tablet 50 mg  50 mg Oral QHS Delfin Gant, NP   50 mg at 08/08/15 2159    Lab Results:  Results for orders placed or performed during the hospital encounter of 08/08/15 (from the past 48 hour(s))  Urinalysis, Routine w reflex microscopic (not at [facility])     Status: Abnormal   Collection Time: 08/08/15  3:12 PM  Result Value Ref Range   Color, Urine YELLOW YELLOW   APPearance CLOUDY (A) CLEAR   Specific Gravity, Urine 1.026 1.005 - 1.030   pH 6.0 5.0 - 8.0   Glucose, UA NEGATIVE NEGATIVE mg/dL   Hgb urine dipstick NEGATIVE NEGATIVE   Bilirubin Urine NEGATIVE NEGATIVE   Ketones, ur NEGATIVE NEGATIVE mg/dL   Protein, ur NEGATIVE NEGATIVE mg/dL   Urobilinogen, UA 1.0 0.0 - 1.0 mg/dL   Nitrite NEGATIVE NEGATIVE   Leukocytes, UA TRACE (A) NEGATIVE    Comment: Performed at [facility]  Urine microscopic-add on     Status: Abnormal   Collection Time: 08/08/15  3:12 PM  Result Value Ref Range   Squamous Epithelial / LPF MANY (A) RARE   WBC, UA 0-2 <3 WBC/hpf    Comment: Performed at [facility]  TSH     Status: None   Collection Time: 08/08/15  7:25 PM  Result Value Ref Range   TSH 0.693 0.350 - 4.500 uIU/mL    Comment: Performed at [facility]     Physical Findings: AIMS: Facial and Oral Movements Muscles of Facial Expression: None, normal Lips and Perioral Area: None, normal Jaw: None, normal Tongue: None, normal,Extremity Movements Upper (arms, wrists, hands, fingers): None, normal Lower (legs, knees, ankles, toes): None, normal, Trunk Movements Neck, shoulders, hips: None, normal, Overall Severity Severity of abnormal movements (highest score from questions above): None, normal Incapacitation due to abnormal movements: None, normal Patient's awareness of abnormal movements (rate only patient's report): No Awareness, Dental Status Current problems with teeth and/or dentures?: No Does patient  usually wear dentures?: No  CIWA:    COWS:      Assessment- at this time patient is denying depression and  Presents euthymic, with full range of affect . Tolerating Prozac well. Denies any suicidal ideations and insists her statements were misconstrued and that she had no SI upon admission.  Reports history of cocaine dependence, and states she is motivated in sobriety. Not interested in Rehab, wants to return home and follow up at outpatient clinic.  Treatment Plan Summary: Daily contact with patient to assess and evaluate symptoms and progress in treatment, Medication management, Plan inpatient admission and medications as below  Continue Prozac at 20 mgrs QDAY for depression Continue Trazodone 50 mgrs QHS for insomnia Continue Nicoderm patch for nicotine cravings Encourage abstinence / 12 step participation to address history of cocaine dependence   Medical Decision Making:  Established Problem, Stable/Improving (1), Review of Psycho-Social Stressors (1), Review or order clinical lab tests (1) and Review of Medication Regimen & Side Effects (2)     Devonte Migues 08/09/2015, 7:50 PM

## 2015-08-10 ENCOUNTER — Encounter (HOSPITAL_COMMUNITY): Payer: Self-pay | Admitting: Psychiatry

## 2015-08-10 MED ORDER — FLUOXETINE HCL 20 MG PO CAPS: 20.0000 mg | ORAL_CAPSULE | Freq: Every day | ORAL | Status: DC

## 2015-08-10 MED ORDER — NICOTINE 21 MG/24HR TD PT24: 21.0000 mg | MEDICATED_PATCH | Freq: Every day | TRANSDERMAL | Status: DC

## 2015-08-10 MED ORDER — TRAZODONE HCL 50 MG PO TABS: 50.0000 mg | ORAL_TABLET | Freq: Every day | ORAL | Status: DC

## 2015-08-10 NOTE — Progress Notes (Signed)
Psychoeducational Group Note  Date: 08/10/2015 Time:  1015  Group Topic/Focus:  Identifying Needs:   The focus of this group is to help patients identify their personal needs that have been historically problematic and identify healthy behaviors to address their needs.  Participation Level:  Active  Participation Quality:  Appropriate  Affect:  Appropriate  Cognitive:  Oriented  Insight:  Improving  Engagement in Group:  Engaged  Additional Comments:    Konstantin Lehnen A 

## 2015-08-10 NOTE — Progress Notes (Signed)
  [facility] Adult Case Management Discharge Plan :  Will you be returning to the same living situation after discharge:  Yes,  with fiance At discharge, do you have transportation home?: Yes,  family/friends Do you have the ability to pay for your medications: Yes,  no issues  Release of information consent forms completed and in the chart;  Patient's signature needed at discharge.  Patient to Follow up at: Follow-up Information    Follow up with [facility]   Specialty:  Behavioral Health   Why:  The transitional care team with contact you to schedule your discharge follow-up appointment.   Contact information:   [place] [address] [phone]       Patient denies SI/HI: Yes,  see dr d/c note    Safety Planning and Suicide Prevention discussed: Yes,  with pt, refused by friend/fiance  Have you used any form of tobacco in the last 30 days? (Cigarettes, Smokeless Tobacco, Cigars, and/or Pipes): Yes  Has patient been referred to the Quitline?: Patient refused referral  Lysle Dingwall 08/10/2015, 12:26 PM

## 2015-08-10 NOTE — Plan of Care (Signed)
Problem: Diagnosis: Increased Risk For Suicide Attempt Goal: STG-Patient Will Comply With Medication Regime Outcome: Progressing Patient is compliant with scheduled medication at hs.

## 2015-08-10 NOTE — Progress Notes (Signed)
.  Psychoeducational Group Note    Date: 08/10/2015 Time:  0930    Goal Setting Purpose of Group: To be able to set a goal that is measurable and that can be accomplished in one day  Participation Level:  Did not attend  Paulino Rily

## 2015-08-10 NOTE — Progress Notes (Signed)
Writer spoke with patient 1"1 and she reports having had a good day and is looking forward to discharge on tomorrow. She reports that she plans to go to [place] once discharged. She attended group this evening and participated. She currently denies si/hi/a/v hallucinations. She has been observed by writer up in the dayroom laughing and talking with select peers. Safety maintained on unit with 15 min checks.

## 2015-08-10 NOTE — Plan of Care (Signed)
Problem: Alteration in mood; excessive anxiety as evidenced by: Goal: STG-Pt will report an absence of self-harm thoughts/actions (Patient will report an absence of self-harm thoughts or actions)  Outcome: Progressing Patient currently denies suicidal thoughts and reports that she will seek out staff if thoughts come.

## 2015-08-10 NOTE — BHH Suicide Risk Assessment (Signed)
[facility] Discharge Suicide Risk Assessment   Demographic Factors:  39 year old single female , lives with fiance, unemployed   Total Time spent with patient: 30 minutes  Musculoskeletal: Strength & Muscle Tone: within normal limits Gait & Station: normal Patient leans: N/A  Psychiatric Specialty Exam: Physical Exam  ROS  Blood pressure 119/90, pulse 84, temperature 98.8 F (37.1 C), temperature source Oral, resp. rate 16, height 5\' 3"  (1.6 m), weight 131 lb (59.421 kg), last menstrual period 06/14/2015, not currently breastfeeding.Body mass index is 23.21 kg/(m^2).  General Appearance: Well Groomed  Engineer, water::  Good  Speech:  Normal Rate409  Volume:  Normal  Mood:  Euthymic  Affect:  Appropriate and Full Range  Thought Process:  Linear  Orientation:  Other:  alert and attentive   Thought Content:  no hallucinations, no delusions   Suicidal Thoughts:  No  Homicidal Thoughts:  No  Memory:  recent and remote grossly intact   Judgement:  Fair  Insight:  present   Psychomotor Activity:  Normal  Concentration:  Good  Recall:  Good  Fund of Knowledge:Good  Language: Good  Akathisia:  Negative  Handed:  Right  AIMS (if indicated):     Assets:  Communication Skills Desire for Improvement Resilience  Sleep:  Number of Hours: 5.75  Cognition: WNL  ADL's:  Improved    Have you used any form of tobacco in the last 30 days? (Cigarettes, Smokeless Tobacco, Cigars, and/or Pipes): Yes  Has this patient used any form of tobacco in the last 30 days? (Cigarettes, Smokeless Tobacco, Cigars, and/or Pipes) Yes, A prescription for an FDA-approved tobacco cessation medication was offered at discharge and the patient refused  Mental Status Per Nursing Assessment::   On Admission:     Current Mental Status by Physician: At this time patient is much improved- she is euthymic, full range of affect, no thought disorder, no SI, no HI, no psychotic symptoms, future oriented. Denies  cravings.,  Loss Factors: Long history of cocaine abuse, unemployment  Historical Factors: History of cocaine dependence, history of depression, denies history of drug abuse .  Risk Reduction Factors:   Good support system, good coping skills, lives with supportive fiance   Continued Clinical Symptoms:  As noted, at this time denies any depression, denies SI, presents euthymic.   Cognitive Features That Contribute To Risk:  No gross cognitive deficits noted upon discharge. Is alert , attentive, and oriented x 3   Suicide Risk:  Mild:  Suicidal ideation of limited frequency, intensity, duration, and specificity.  There are no identifiable plans, no associated intent, mild dysphoria and related symptoms, good self-control (both objective and subjective assessment), few other risk factors, and identifiable protective factors, including available and accessible social support.  Principal Problem: MDD (major depressive disorder), recurrent severe, without psychosis Discharge Diagnoses:  Patient Active Problem List   Diagnosis Date Noted  . MDD (major depressive disorder), recurrent severe, without psychosis [F33.2] 08/08/2015  . Cocaine use disorder, severe, dependence [F14.20] 08/08/2015  . Cigar smoker motivated to quit [F17.290] 07/06/2014  . Substance abuse [F19.10] 07/06/2014  . Pregnancy [Z33.1] 06/05/2014    Follow-up Information    Follow up with [facility].   Specialty:  Behavioral Health   Why:  The transitional care team with contact you to schedule your discharge follow-up appointment.   Contact information:   [place] [address] [phone]       Plan Of Care/Follow-up recommendations:  Activity:  as tolerated  Diet:  regular Tests:  NA Other:  see below  Is patient on multiple antipsychotic therapies at discharge:  No   Has Patient had three or more failed trials of antipsychotic monotherapy by history:  No  Recommended Plan for Multiple  Antipsychotic Therapies: NA  Patient is leaving unit in good spirits- there are no grounds for involuntary commitment at this time. Plans to return home with fiance. Encouraged her to go 12 step meetings .  Patient states she plans to move in to an [facility] in the near future .   COBOS, FERNANDO 08/10/2015, 8:42 AM

## 2015-08-10 NOTE — Progress Notes (Signed)
Deborah Melendez is readied for DC as evidenced by the MD completing DC order and DC SRA. Deborah Melendez completed her daily assessment first thing this moring and on it she wrote she denied SI today, she rated her depression, anxiety and hopelessness "" 0/0/0/", respectively . Her AVS is reviewed with her and she states understanding and willingness to comply and then she is given prescriptions for meds and sample meds from the pharmacist and then all belongings are returned to her and she is escorted to bldg entrance and dc'd per poc.

## 2015-08-10 NOTE — Discharge Summary (Signed)
Physician Discharge Summary Note  Patient:  Deborah Melendez is an 39 y.o., female MRN:  [id number] DOB:  [birth year] Patient phone:  [phone] (home)  Patient address:   [address] [place]  [id number],  Total Time spent with patient: 30 minutes  Date of Admission:  08/08/2015 Date of Discharge: 08/10/2015  Reason for Admission:  Depression with SI  Principal Problem: MDD (major depressive disorder), recurrent severe, without psychosis Discharge Diagnoses: Patient Active Problem List   Diagnosis Date Noted  . MDD (major depressive disorder), recurrent severe, without psychosis [F33.2] 08/08/2015  . Cocaine use disorder, severe, dependence [F14.20] 08/08/2015  . Cigar smoker motivated to quit [F17.290] 07/06/2014  . Substance abuse [F19.10] 07/06/2014  . Pregnancy [Z33.1] 06/05/2014    Musculoskeletal: Strength & Muscle Tone: within normal limits Gait & Station: normal Patient leans: N/A  Psychiatric Specialty Exam: Physical Exam  Psychiatric: She has a normal mood and affect. Her speech is normal and behavior is normal. Judgment and thought content normal. Cognition and memory are normal.    Review of Systems  Constitutional: Negative.   HENT: Negative.   Eyes: Negative.   Respiratory: Negative.   Cardiovascular: Negative.   Gastrointestinal: Negative.   Genitourinary: Negative.   Musculoskeletal: Negative.   Skin: Negative.   Neurological: Negative.   Endo/Heme/Allergies: Negative.   Psychiatric/Behavioral: Positive for depression (Stabilized with treatments ). Negative for suicidal ideas, hallucinations, memory loss and substance abuse. The patient is not nervous/anxious and does not have insomnia.     Blood pressure 119/90, pulse 84, temperature 98.8 F (37.1 C), temperature source Oral, resp. rate 16, height 5\' 3"  (1.6 m), weight 59.421 kg (131 lb), last menstrual period 06/14/2015, not currently breastfeeding.Body mass index is 23.21 kg/(m^2).  See  Physician SRA     Have you used any form of tobacco in the last 30 days? (Cigarettes, Smokeless Tobacco, Cigars, and/or Pipes): Yes  Has this patient used any form of tobacco in the last 30 days? (Cigarettes, Smokeless Tobacco, Cigars, and/or Pipes) Yes, A prescription for an FDA-approved tobacco cessation medication was offered at discharge and the patient refused  Past Medical History:  Past Medical History  Diagnosis Date  . MRSA (methicillin resistant Staphylococcus aureus)     surgery on finger 3 years ago    Past Surgical History  Procedure Laterality Date  . Finger surgery     Family History:  Family History  Problem Relation Age of Onset  . Diabetes Mother   . Hypertension Mother    Social History:  History  Alcohol Use No     History  Drug Use No    Comment: 30 days sober, just completed treatment at [facility] 8/11    Social History   Social History  . Marital Status: Single    Spouse Name: N/A  . Number of Children: N/A  . Years of Education: N/A   Social History Main Topics  . Smoking status: Current Every Day Smoker -- 0.50 packs/day    Types: Cigarettes  . Smokeless tobacco: None  . Alcohol Use: No  . Drug Use: No     Comment: 30 days sober, just completed treatment at [facility] 8/11  . Sexual Activity: Not Asked   Other Topics Concern  . None   Social History Narrative   Risk to Self: Is patient at risk for suicide?: Yes What has been your use of drugs/alcohol within the last 12 months?:  (patient was paranoid, irritable and declined to answer) Risk to Others:  Prior Inpatient Therapy:   Prior Outpatient Therapy:    Level of Care:  OP  Hospital Course:   Deborah Melendez is an 39 y.o. female presenting to [facility]. Patient has complaints of increased depression and suicidal thoughts. Patient stating, "I don't know if I want to live anymore". Onset of suicidal thoughts started Monday afternoon after she was discharged from [facility]. Patient  spent 3 weeks at [facility] for drug rehabilitation (crack cocaine). Last used crack cocaine 3 weeks ago. Sts that she did not receive any of her "psych meds" and wants to get back on them. Patient sts she was taking Trazodone (dosage/frequency unk) and "Something to help me sleep". Patient is suicidal because she doesn't have her meds. She has a plan to cut her wrist, "Just like I did before". Patient has access to kitchen knives. She also tried to commit suicide by cutting her wrist years ago. She recalls the trigger for her previous suicide attempt as "depression". She denies HI and AVH's. Patient does not have a outpatient mental health provider. She was hospitalized at [facility] in the past.          Deborah Melendez was admitted to the adult 400 unit where she was evaluated and her symptoms were identified. Medication management was discussed and implemented. Patient was not taking any psychiatric medications prior to admission. Patient was started on Prozac10 mg daily for depression.  She was encouraged to participate in unit programming. Medical problems were identified and treated appropriately. Home medication was restarted as needed.  She was evaluated each day by a clinical provider to ascertain the patient's response to treatment.  Improvement was noted by the patient's report of decreasing symptoms, improved sleep and appetite, affect, medication tolerance, behavior, and participation in unit programming.  The patient was asked each day to complete a self inventory noting mood, mental status, pain, new symptoms, anxiety and concerns.         She responded well to medication and being in a therapeutic and supportive environment.  Positive and appropriate behavior was noted and the patient was motivated for recovery.  She presents euthymic, and denied any neuro-vegetative symptoms of depression or sadness . Denied any suicidal ideations. No disruptive behaviors on unit. Had been going to groups, participating  well, and attended + sang at [place] on Thursday evening. Regarding reported suicidal ideations when she came to ED states " I think they misunderstood me. I definitely do not want to hurt myself ".  She worked closely with the treatment team and case manager to develop a discharge plan with appropriate goals. Coping skills, problem solving as well as relaxation therapies were also part of the unit programming. Patient appeared motivated to abstain from further crack use as she just completed treatment at [facility].          By the day of discharge she was in much improved condition than upon admission.  Symptoms were reported as significantly decreased or resolved completely. The patient denied SI/HI and voiced no AVH. She was motivated to continue taking medication with a goal of continued improvement in mental health. Deborah Melendez was discharged home with a plan to follow up as noted below. The patient was provided with three day sample medications and prescriptions at time of discharge. She left [facility] in stable condition with all belongings returned to her.   Consults:  None  Significant Diagnostic Studies:  Chemistry panel, CBC, TSH, UA, UDS negative   Discharge Vitals:   Blood  pressure 119/90, pulse 84, temperature 98.8 F (37.1 C), temperature source Oral, resp. rate 16, height 5\' 3"  (1.6 m), weight 59.421 kg (131 lb), last menstrual period 06/14/2015, not currently breastfeeding. Body mass index is 23.21 kg/(m^2). Lab Results:   No results found for this or any previous visit (from the past 72 hour(s)).  Physical Findings: AIMS: Facial and Oral Movements Muscles of Facial Expression: None, normal Lips and Perioral Area: None, normal Jaw: None, normal Tongue: None, normal,Extremity Movements Upper (arms, wrists, hands, fingers): None, normal Lower (legs, knees, ankles, toes): None, normal, Trunk Movements Neck, shoulders, hips: None, normal, Overall Severity Severity of  abnormal movements (highest score from questions above): None, normal Incapacitation due to abnormal movements: None, normal Patient's awareness of abnormal movements (rate only patient's report): No Awareness, Dental Status Current problems with teeth and/or dentures?: No Does patient usually wear dentures?: No  CIWA:    COWS:      See Psychiatric Specialty Exam and Suicide Risk Assessment completed by Attending Physician prior to discharge.  Discharge destination:  Home  Is patient on multiple antipsychotic therapies at discharge:  No   Has Patient had three or more failed trials of antipsychotic monotherapy by history:  No  Recommended Plan for Multiple Antipsychotic Therapies: NA     Medication List    STOP taking these medications        clotrimazole 1 % cream  Commonly known as:  LOTRIMIN     doxycycline 100 MG capsule  Commonly known as:  VIBRAMYCIN     HYDROcodone-acetaminophen 5-325 MG per tablet  Commonly known as:  NORCO/VICODIN     metroNIDAZOLE 500 MG tablet  Commonly known as:  FLAGYL     varenicline 0.5 MG tablet  Commonly known as:  CHANTIX      TAKE these medications      Indication   FLUoxetine 20 MG capsule  Commonly known as:  PROZAC  Take 1 capsule (20 mg total) by mouth daily.   Indication:  Depression, Major Depressive Disorder     hydrOXYzine 25 MG tablet  Commonly known as:  ATARAX/VISTARIL  Take 1 tablet (25 mg total) by mouth every 6 (six) hours.      ibuprofen 800 MG tablet  Commonly known as:  ADVIL,MOTRIN  TAKE 1 TABLET BY MOUTH EVERY 8 HOURS AS NEEDED FOR  MILD PAIN      nicotine 21 mg/24hr patch  Commonly known as:  NICODERM CQ - dosed in mg/24 hours  Place 1 patch (21 mg total) onto the skin daily.   Indication:  Nicotine Addiction     ondansetron 4 MG disintegrating tablet  Commonly known as:  ZOFRAN-ODT  DISSOLVE 1 TABLET IN MOUTH EVERY 8 HOURS AS NEEDED FOR NAUSEA OR VOMITING      traZODone 50 MG tablet  Commonly  known as:  DESYREL  Take 1 tablet (50 mg total) by mouth at bedtime.   Indication:  Trouble Sleeping       Follow-up Information    Follow up with [facility].   Specialty:  Behavioral Health   Why:  The transitional care team with contact you to schedule your discharge follow-up appointment.   Contact information:   [place] [address] [phone]       Follow-up recommendations:   Activity: as tolerated Diet: regular Tests: NA Other: see below  Comments:   Take all your medications as prescribed by your mental healthcare provider.  Report any adverse effects and  or reactions from your medicines to your outpatient provider promptly.  Patient is instructed and cautioned to not engage in alcohol and or illegal drug use while on prescription medicines.  In the event of worsening symptoms, patient is instructed to call the crisis hotline, 911 and or go to the nearest ED for appropriate evaluation and treatment of symptoms.  Follow-up with your primary care provider for your other medical issues, concerns and or health care needs.   Total Discharge Time: Greater than 30 minutes  Signed: Elmarie Shiley, NP-C 08/12/2015, 9:06 AM   Patient seen, Suicide Assessment Completed.  Disposition Plan Reviewed

## 2015-08-13 ENCOUNTER — Telehealth (HOSPITAL_BASED_OUTPATIENT_CLINIC_OR_DEPARTMENT_OTHER): Payer: Self-pay | Admitting: Emergency Medicine

## 2015-08-13 NOTE — Telephone Encounter (Signed)
Letter sent for notification returned, no forwarding address, lost to followup

## 2015-08-19 ENCOUNTER — Telehealth (HOSPITAL_COMMUNITY): Payer: Self-pay

## 2015-08-19 NOTE — Telephone Encounter (Signed)
Unable to contact pt by mail or telephone. Unable to communicate lab results or treatment changes. 

## 2016-02-25 ENCOUNTER — Emergency Department (HOSPITAL_COMMUNITY): Payer: Medicaid Other

## 2016-02-25 ENCOUNTER — Emergency Department (HOSPITAL_COMMUNITY)
Admission: EM | Admit: 2016-02-25 | Discharge: 2016-02-25 | Disposition: A | Discharge status: Home / Self Care | Payer: Medicaid Other | Attending: Emergency Medicine | Admitting: Emergency Medicine

## 2016-02-25 ENCOUNTER — Encounter (HOSPITAL_COMMUNITY): Payer: Self-pay | Admitting: *Deleted

## 2016-02-25 DIAGNOSIS — M25461 Effusion, right knee: Secondary | ICD-10-CM | POA: Diagnosis not present

## 2016-02-25 DIAGNOSIS — W1789XA Other fall from one level to another, initial encounter: Secondary | ICD-10-CM | POA: Diagnosis not present

## 2016-02-25 DIAGNOSIS — Z79899 Other long term (current) drug therapy: Secondary | ICD-10-CM | POA: Insufficient documentation

## 2016-02-25 DIAGNOSIS — Y998 Other external cause status: Secondary | ICD-10-CM | POA: Insufficient documentation

## 2016-02-25 DIAGNOSIS — Y9289 Other specified places as the place of occurrence of the external cause: Secondary | ICD-10-CM | POA: Diagnosis not present

## 2016-02-25 DIAGNOSIS — F17210 Nicotine dependence, cigarettes, uncomplicated: Secondary | ICD-10-CM | POA: Diagnosis not present

## 2016-02-25 DIAGNOSIS — S8991XA Unspecified injury of right lower leg, initial encounter: Secondary | ICD-10-CM | POA: Insufficient documentation

## 2016-02-25 DIAGNOSIS — Z8614 Personal history of Methicillin resistant Staphylococcus aureus infection: Secondary | ICD-10-CM | POA: Diagnosis not present

## 2016-02-25 DIAGNOSIS — Y9339 Activity, other involving climbing, rappelling and jumping off: Secondary | ICD-10-CM | POA: Diagnosis not present

## 2016-02-25 MED ORDER — NAPROXEN 500 MG PO TABS: 500.0000 mg | ORAL_TABLET | Freq: Two times a day (BID) | ORAL | Status: DC

## 2016-02-25 MED ORDER — ONDANSETRON 4 MG PO TBDP
4.0000 mg | ORAL_TABLET | Freq: Once | ORAL | Status: AC
  Administered 2016-02-25: 4 mg via ORAL
  Filled 2016-02-25: qty 1

## 2016-02-25 MED ORDER — OXYCODONE-ACETAMINOPHEN 5-325 MG PO TABS
2.0000 | ORAL_TABLET | Freq: Once | ORAL | Status: AC
  Administered 2016-02-25: 2 via ORAL
  Filled 2016-02-25: qty 2

## 2016-02-25 MED ORDER — OXYCODONE-ACETAMINOPHEN 5-325 MG PO TABS: 1.0000 | ORAL_TABLET | Freq: Once | ORAL | Status: DC

## 2016-02-25 NOTE — ED Notes (Signed)
Pt arrives with c/o left knee pain. Pt has an ace wrap on knee upon arrival. Pt states, "I think I broke my knee, something's wrong with my knee".

## 2016-02-25 NOTE — ED Notes (Signed)
Pt verbalized understanding of all discharge instructions.  Signature pad in room not working.  Patient states she was in a hurry to leave.

## 2016-02-25 NOTE — Progress Notes (Signed)
Orthopedic Tech Progress Note Patient Details:  Deborah Melendez 12/20/1976 [id number]  Ortho Devices Type of Ortho Device: Knee Immobilizer, Crutches Ortho Device/Splint Location: RLE Ortho Device/Splint Interventions: Ordered, Application   Braulio Bosch 02/25/2016, 9:42 PM

## 2016-02-25 NOTE — ED Notes (Signed)
Patient transported to X-ray 

## 2016-02-25 NOTE — Discharge Instructions (Signed)
Knee Effusion Knee effusion means that you have excess fluid in your knee joint. This can cause pain and swelling in your knee. This may make your knee more difficult to bend and move. That is because there is increased pain and pressure in the joint. If there is fluid in your knee, it often means that something is wrong inside your knee, such as severe arthritis, abnormal inflammation, or an infection. Another common cause of knee effusion is an injury to the knee muscles, ligaments, or cartilage. HOME CARE INSTRUCTIONS  Use crutches as directed by your health care provider.  Wear a knee brace as directed by your health care provider.  Apply ice to the swollen area:  Put ice in a plastic bag.  Place a towel between your skin and the bag.  Leave the ice on for 20 minutes, 2-3 times per day.  Keep your knee raised (elevated) when you are sitting or lying down.  Take medicines only as directed by your health care provider.  Do any rehabilitation or strengthening exercises as directed by your health care provider.  Rest your knee as directed by your health care provider. You may start doing your normal activities again when your health care provider approves.   Keep all follow-up visits as directed by your health care provider. This is important. SEEK MEDICAL CARE IF:  You have ongoing (persistent) pain in your knee. SEEK IMMEDIATE MEDICAL CARE IF:  You have increased swelling or redness of your knee.  You have severe pain in your knee.  You have a fever.   This information is not intended to replace advice given to you by your health care provider. Make sure you discuss any questions you have with your health care provider.   Document Released: 03/05/2004 Document Revised: 01/04/2015 Document Reviewed: 07/30/2014 Elsevier Interactive Patient Education 2016 [employer]. How to Use a Knee Brace A knee brace is a device that you wear to support your knee, especially if the  knee is healing after an injury or surgery. There are several types of knee braces. Some are designed to prevent an injury (prophylactic brace). These are often worn during sports. Others support an injured knee (functional brace) or keep it still while it heals (rehabilitative brace). People with severe arthritis of the knee may benefit from a brace that takes some pressure off the knee (unloader brace). Most knee braces are made from a combination of cloth and metal or plastic.  You may need to wear a knee brace to:  Relieve knee pain.  Help your knee support your weight (improve stability).  Help you walk farther (improve mobility).  Prevent injury.  Support your knee while it heals from surgery or from an injury. RISKS AND COMPLICATIONS Generally, knee braces are very safe to wear. However, problems may occur, including:  Skin irritation that may lead to infection.  Making your condition worse if you wear the brace in the wrong way. HOW TO USE A KNEE BRACE Different braces will have different instructions for use. Your health care provider will tell you or show you:  How to put on your brace.  How to adjust the brace.  When and how often to wear the brace.  How to remove the brace.  If you will need any assistive devices in addition to the brace, such as crutches or a cane. In general, your brace should:  Have the hinge of the brace line up with the bend of your knee.  Have straps,  hooks, or tapes that fasten snugly around your leg.  Not feel too tight or too loose. HOW TO CARE FOR A KNEE BRACE  Check your brace often for signs of damage, such as loose connections or attachments. Your knee brace may get damaged or wear out during normal use.  Wash the fabric parts of your brace with soap and water.  Read the insert that comes with your brace for other specific care instructions. SEEK MEDICAL CARE IF:  Your knee brace is too loose or too tight and you cannot adjust  it.  Your knee brace causes skin redness, swelling, bruising, or irritation.  Your knee brace is not helping.  Your knee brace is making your knee pain worse.   This information is not intended to replace advice given to you by your health care provider. Make sure you discuss any questions you have with your health care provider.   Document Released: 03/05/2004 Document Revised: 09/04/2015 Document Reviewed: 04/08/2015 Elsevier Interactive Patient Education [employer].

## 2016-02-25 NOTE — ED Notes (Signed)
Paged ortho 

## 2016-02-25 NOTE — ED Provider Notes (Signed)
CSN: [id number]     Arrival date & time 02/25/16  1935 History  By signing my name below, I, Julien Nordmann, attest that this documentation has been prepared under the direction and in the presence of Margarita Mail, PA-C. Electronically Signed: Julien Nordmann, ED Scribe. 02/25/2016. 9:27 PM.    Chief Complaint  Patient presents with  . Knee Pain     The history is provided by the patient. No language interpreter was used.   HPI Comments: Deborah Melendez is a 40 y.o. female who presents to the Emergency Department complaining of constant, gradual worsening right knee pain and swelling onset yesterday afternoon. She reports not being able to "feel her toes". Pt reports she jumped off of the porch yesterday and landed on her right knee. Pt says when she jumped down she felt immediate pain. She states she feels like her knee went out of place. Pt feels increased pain and difficulty when ambulating. She has not taken any medication to alleviate the pain. Pt denies fever and hx of gout.   Past Medical History  Diagnosis Date  . MRSA (methicillin resistant Staphylococcus aureus)     surgery on finger 3 years ago   Past Surgical History  Procedure Laterality Date  . Finger surgery     Family History  Problem Relation Age of Onset  . Diabetes Mother   . Hypertension Mother    Social History  Substance Use Topics  . Smoking status: Current Every Day Smoker -- 0.50 packs/day    Types: Cigarettes  . Smokeless tobacco: Not on file  . Alcohol Use: No   OB History    Gravida Para Term Preterm AB TAB SAB Ectopic Multiple Living   7 5        5      Review of Systems  Constitutional: Negative for fever.  Musculoskeletal: Positive for joint swelling and arthralgias.  All other systems reviewed and are negative.     Allergies  Review of patient's allergies indicates no known allergies.  Home Medications   Prior to Admission medications   Medication Sig Start Date End Date Taking?  Authorizing Provider  FLUoxetine (PROZAC) 20 MG capsule Take 1 capsule (20 mg total) by mouth daily. 08/10/15   Niel Hummer, NP  hydrOXYzine (ATARAX/VISTARIL) 25 MG tablet Take 1 tablet (25 mg total) by mouth every 6 (six) hours. Patient not taking: Reported on 07/15/2015 07/18/14   Veryl Speak, MD  ibuprofen (ADVIL,MOTRIN) 800 MG tablet TAKE 1 TABLET BY MOUTH EVERY 8 HOURS AS NEEDED FOR  MILD PAIN 07/15/15   Historical Provider, MD  nicotine (NICODERM CQ - DOSED IN MG/24 HOURS) 21 mg/24hr patch Place 1 patch (21 mg total) onto the skin daily. 08/10/15   Niel Hummer, NP  ondansetron (ZOFRAN-ODT) 4 MG disintegrating tablet DISSOLVE 1 TABLET IN MOUTH EVERY 8 HOURS AS NEEDED FOR NAUSEA OR VOMITING 07/15/15   Historical Provider, MD  traZODone (DESYREL) 50 MG tablet Take 1 tablet (50 mg total) by mouth at bedtime. 08/10/15   Niel Hummer, NP   Triage vitals: BP 152/96 mmHg  Pulse 77  Temp(Src) 98.9 F (37.2 C) (Oral)  Resp 16  SpO2 100% Physical Exam  Constitutional: She is oriented to person, place, and time. She appears well-developed and well-nourished.  HENT:  Head: Normocephalic.  Eyes: EOM are normal.  Neck: Normal range of motion.  Pulmonary/Chest: Effort normal.  Abdominal: She exhibits no distension.  Musculoskeletal: Normal range of motion. She exhibits edema and  tenderness.  Very large right knee effusion, exquisitly TTP, ROM is very limited, warm but not erythematous, unable to bear weight or ambulate on the knee, good distal pulses  Neurological: She is alert and oriented to person, place, and time.  Psychiatric: She has a normal mood and affect.  Nursing note and vitals reviewed.   ED Course  Procedures  DIAGNOSTIC STUDIES: Oxygen Saturation is 100% on RA, normal by my interpretation.  COORDINATION OF CARE:  9:25 PM Discussed treatment plan which includes knee immobilizer, pain medication and follow up with ortho with pt at bedside and pt agreed to plan.  Labs  Review Labs Reviewed - No data to display  Imaging Review No results found. I have personally reviewed and evaluated these images and lab results as part of my medical decision-making.   EKG Interpretation None      MDM   Final diagnoses:  Knee injury, right, initial encounter  Knee effusion, right   Patient with large knee effusion. I suspect internal derangement. Her x-ray is negative. Other possibilities include tibial plateau fracture. I doubt posterior dislocation. Patient will be placed in a compression with knee immobilizer and crutches. Pain medications, or their follow-up, icing and elevation. Patient X-Ray negative for obvious fracture or dislocation. Pain managed in ED. Pt advised to follow up with orthopedics if symptoms persist for possibility of missed fracture diagnosis. Patient given brace while in ED, conservative therapy recommended and discussed. Patient will be dc home & is agreeable with above plan.  I personally performed the services described in this documentation, which was scribed in my presence. The recorded information has been reviewed and is accurate.       Margarita Mail, PA-C 02/25/16 [id number]  Lajean Saver, MD 02/25/16 [phone]

## 2016-02-25 NOTE — ED Notes (Signed)
PA at bedside with patient

## 2016-02-25 NOTE — ED Notes (Signed)
Ortho at bedside applying devices

## 2016-02-25 NOTE — ED Notes (Signed)
Patient able to ambulate with crutches.

## 2016-02-26 ENCOUNTER — Telehealth: Payer: Self-pay | Admitting: *Deleted

## 2016-02-26 NOTE — Telephone Encounter (Signed)
Pharmacy called related to Rx: oxyCODONE-acetaminophen (PERCOCET/ROXICET) 5-325 MG tablet 1-2 tablet, Once  .Marland KitchenMarland KitchenEDCM clarified with EDP to change Rx directions to: take 1-2 tablet PRN for pain BID.

## 2016-06-26 ENCOUNTER — Encounter (HOSPITAL_COMMUNITY): Payer: Self-pay | Admitting: Physical Medicine and Rehabilitation

## 2016-06-26 ENCOUNTER — Emergency Department (HOSPITAL_COMMUNITY)
Admission: EM | Admit: 2016-06-26 | Discharge: 2016-06-26 | Disposition: A | Discharge status: Home / Self Care | Payer: Medicaid Other | Attending: Dermatology | Admitting: Dermatology

## 2016-06-26 DIAGNOSIS — F17210 Nicotine dependence, cigarettes, uncomplicated: Secondary | ICD-10-CM | POA: Diagnosis not present

## 2016-06-26 DIAGNOSIS — M25561 Pain in right knee: Secondary | ICD-10-CM | POA: Diagnosis not present

## 2016-06-26 DIAGNOSIS — Z5321 Procedure and treatment not carried out due to patient leaving prior to being seen by health care provider: Secondary | ICD-10-CM | POA: Diagnosis not present

## 2016-06-26 NOTE — ED Notes (Signed)
Called Pt. Pt did not respond.

## 2016-06-26 NOTE — ED Notes (Signed)
Pt reports she twisted R knee this morning while walking outside onto porch. States increased pain.

## 2016-06-26 NOTE — ED Notes (Addendum)
Patient did not answer for X-ray

## 2016-08-19 ENCOUNTER — Emergency Department (HOSPITAL_COMMUNITY)
Admission: EM | Admit: 2016-08-19 | Discharge: 2016-08-19 | Discharge status: Against Medical Advice | Payer: Medicaid Other

## 2016-08-19 NOTE — ED Triage Notes (Signed)
Called for triage x 1  No response

## 2016-08-19 NOTE — ED Notes (Signed)
Pt called for triage  No response from lobby  

## 2016-08-20 ENCOUNTER — Encounter (HOSPITAL_COMMUNITY): Payer: Self-pay

## 2016-08-20 ENCOUNTER — Encounter (HOSPITAL_COMMUNITY): Payer: Self-pay | Admitting: Emergency Medicine

## 2016-08-20 ENCOUNTER — Emergency Department (HOSPITAL_COMMUNITY)
Admission: EM | Admit: 2016-08-20 | Discharge: 2016-08-20 | Payer: Medicaid Other | Attending: Emergency Medicine | Admitting: Emergency Medicine

## 2016-08-20 ENCOUNTER — Inpatient Hospital Stay (HOSPITAL_COMMUNITY)
Admission: AD | Admit: 2016-08-20 | Discharge: 2016-08-28 | DRG: 885 | Disposition: A | Discharge status: Home / Self Care | Payer: Federal, State, Local not specified - Other | Source: Intra-hospital | Attending: Psychiatry | Admitting: Psychiatry

## 2016-08-20 DIAGNOSIS — G4700 Insomnia, unspecified: Secondary | ICD-10-CM | POA: Diagnosis present

## 2016-08-20 DIAGNOSIS — F1420 Cocaine dependence, uncomplicated: Secondary | ICD-10-CM | POA: Diagnosis present

## 2016-08-20 DIAGNOSIS — F331 Major depressive disorder, recurrent, moderate: Principal | ICD-10-CM | POA: Diagnosis present

## 2016-08-20 DIAGNOSIS — F17210 Nicotine dependence, cigarettes, uncomplicated: Secondary | ICD-10-CM | POA: Diagnosis present

## 2016-08-20 DIAGNOSIS — F1290 Cannabis use, unspecified, uncomplicated: Secondary | ICD-10-CM | POA: Diagnosis present

## 2016-08-20 DIAGNOSIS — R45851 Suicidal ideations: Secondary | ICD-10-CM | POA: Insufficient documentation

## 2016-08-20 DIAGNOSIS — Z5181 Encounter for therapeutic drug level monitoring: Secondary | ICD-10-CM | POA: Insufficient documentation

## 2016-08-20 DIAGNOSIS — Z915 Personal history of self-harm: Secondary | ICD-10-CM

## 2016-08-20 LAB — CBC WITH DIFFERENTIAL/PLATELET
Basophils Absolute: 0 10*3/uL (ref 0.0–0.1)
Basophils Relative: 1 %
Eosinophils Absolute: 0.3 10*3/uL (ref 0.0–0.7)
Eosinophils Relative: 6 %
HEMATOCRIT: 37.3 % (ref 36.0–46.0)
HEMOGLOBIN: 12.5 g/dL (ref 12.0–15.0)
Lymphocytes Relative: 37 %
Lymphs Abs: 1.6 10*3/uL (ref 0.7–4.0)
MCH: 29.9 pg (ref 26.0–34.0)
MCHC: 33.5 g/dL (ref 30.0–36.0)
MCV: 89.2 fL (ref 78.0–100.0)
MONOS PCT: 6 %
Monocytes Absolute: 0.3 10*3/uL (ref 0.1–1.0)
Neutro Abs: 2.2 10*3/uL (ref 1.7–7.7)
Neutrophils Relative %: 50 %
Platelets: 239 10*3/uL (ref 150–400)
RBC: 4.18 MIL/uL (ref 3.87–5.11)
RDW: 16.3 % — ABNORMAL HIGH (ref 11.5–15.5)
WBC: 4.4 10*3/uL (ref 4.0–10.5)

## 2016-08-20 LAB — COMPREHENSIVE METABOLIC PANEL
ALBUMIN: 4 g/dL (ref 3.5–5.0)
ALK PHOS: 51 U/L (ref 38–126)
ALT: 11 U/L — ABNORMAL LOW (ref 14–54)
AST: 18 U/L (ref 15–41)
Anion gap: 4 — ABNORMAL LOW (ref 5–15)
BUN: 12 mg/dL (ref 6–20)
CALCIUM: 8.8 mg/dL — AB (ref 8.9–10.3)
CO2: 24 mmol/L (ref 22–32)
Chloride: 111 mmol/L (ref 101–111)
Creatinine, Ser: 0.84 mg/dL (ref 0.44–1.00)
GFR calc Af Amer: >60 mL/min (ref >60)
GFR calc non Af Amer: >60 mL/min (ref >60)
GLUCOSE: 93 mg/dL (ref 65–99)
POTASSIUM: 3.4 mmol/L — AB (ref 3.5–5.1)
Sodium: 139 mmol/L (ref 135–145)
Total Bilirubin: 0.4 mg/dL (ref 0.3–1.2)
Total Protein: 7.6 g/dL (ref 6.5–8.1)

## 2016-08-20 LAB — ACETAMINOPHEN LEVEL

## 2016-08-20 LAB — RAPID URINE DRUG SCREEN, HOSP PERFORMED
AMPHETAMINES: NOT DETECTED
Barbiturates: NOT DETECTED
Benzodiazepines: NOT DETECTED
Cocaine: POSITIVE — AB
Opiates: NOT DETECTED
TETRAHYDROCANNABINOL: POSITIVE — AB

## 2016-08-20 LAB — SALICYLATE LEVEL: Salicylate Lvl: <4 mg/dL (ref 2.8–30.0)

## 2016-08-20 LAB — ETHANOL

## 2016-08-20 LAB — I-STAT BETA HCG BLOOD, ED (MC, WL, AP ONLY): I-stat hCG, quantitative: <5 m[IU]/mL (ref <5)

## 2016-08-20 MED ORDER — TRAZODONE HCL 50 MG PO TABS
50.0000 mg | ORAL_TABLET | Freq: Every day | ORAL | Status: DC
  Administered 2016-08-20 – 2016-08-22 (×3): 50 mg via ORAL
  Filled 2016-08-20 (×3): qty 1

## 2016-08-20 MED ORDER — ALUM & MAG HYDROXIDE-SIMETH 200-200-20 MG/5ML PO SUSP
30.0000 mL | ORAL | Status: DC | PRN
  Administered 2016-08-27 – 2016-08-28 (×2): 30 mL via ORAL
  Filled 2016-08-20 (×2): qty 30

## 2016-08-20 MED ORDER — MAGNESIUM HYDROXIDE 400 MG/5ML PO SUSP: 30.0000 mL | Freq: Every day | ORAL | Status: DC | PRN

## 2016-08-20 MED ORDER — HYDROXYZINE HCL 25 MG PO TABS
25.0000 mg | ORAL_TABLET | Freq: Four times a day (QID) | ORAL | Status: DC
  Administered 2016-08-20 – 2016-08-28 (×24): 25 mg via ORAL
  Filled 2016-08-20 (×43): qty 1

## 2016-08-20 MED ORDER — POTASSIUM CHLORIDE CRYS ER 20 MEQ PO TBCR
40.0000 meq | EXTENDED_RELEASE_TABLET | Freq: Once | ORAL | Status: AC
  Administered 2016-08-20: 40 meq via ORAL
  Filled 2016-08-20: qty 2

## 2016-08-20 MED ORDER — FLUOXETINE HCL 10 MG PO CAPS
10.0000 mg | ORAL_CAPSULE | Freq: Every day | ORAL | Status: DC
  Administered 2016-08-21: 10 mg via ORAL
  Filled 2016-08-20: qty 1

## 2016-08-20 MED ORDER — ACETAMINOPHEN 325 MG PO TABS
650.0000 mg | ORAL_TABLET | Freq: Four times a day (QID) | ORAL | Status: DC | PRN
  Administered 2016-08-21 – 2016-08-27 (×6): 650 mg via ORAL
  Filled 2016-08-20 (×6): qty 2

## 2016-08-20 NOTE — ED Notes (Signed)
PT info me she unable to give urine sample at this time. State she recently just went

## 2016-08-20 NOTE — ED Notes (Signed)
Items wanded by security.

## 2016-08-20 NOTE — ED Notes (Signed)
Patient admits SI with no plan and denies Patient denies HI and AVH at this time. Encouragement and support provided and safety maintain. Q 15 min safety maintain and video monitoring.

## 2016-08-20 NOTE — ED Provider Notes (Signed)
[facility] DEPT Provider Note   CSN: [id number] Arrival date & time: 08/20/16  1215     History   Chief Complaint Chief Complaint  Patient presents with  . Suicidal    HPI Deborah Melendez is a 40 y.o. female.  HPI 41 year old female with past medical history of chronic polysubstance abuse and depression who presents with depression. Patient states that her significant other was deported 3 weeks ago. Since then she has been inside her house. She has had worsening thoughts of depression, suicidal ideation and suicidal thoughts with plans to possibly overdose. She has a history of suicidal attempts and has been previously hospitalized. She states she has little to no hope, as well as no support as she is present from another state. Denies any fevers or chills. Denies any headache  Past Medical History:  Diagnosis Date  . MRSA (methicillin resistant Staphylococcus aureus)    surgery on finger 3 years ago    Patient Active Problem List   Diagnosis Date Noted  . Major depressive disorder, recurrent episode, moderate degree ([place]) 08/20/2016  . MDD (major depressive disorder), recurrent severe, without psychosis ([place]) 08/08/2015  . Cocaine use disorder, severe, dependence ([place]) 08/08/2015  . Cigar smoker motivated to quit 07/06/2014  . Substance abuse 07/06/2014  . Pregnancy 06/05/2014    Past Surgical History:  Procedure Laterality Date  . FINGER SURGERY      OB History    Gravida Para Term Preterm AB Living   7 5       5    SAB TAB Ectopic Multiple Live Births           1       Home Medications    Prior to Admission medications   Medication Sig Start Date End Date Taking? Authorizing Provider  FLUoxetine (PROZAC) 20 MG capsule Take 1 capsule (20 mg total) by mouth daily. Patient not taking: Reported on 08/20/2016 08/10/15   Niel Hummer, NP  hydrOXYzine (ATARAX/VISTARIL) 25 MG tablet Take 1 tablet (25 mg total) by mouth every 6 (six) hours. Patient not taking:  Reported on 07/15/2015 07/18/14   Veryl Speak, MD  traZODone (DESYREL) 50 MG tablet Take 1 tablet (50 mg total) by mouth at bedtime. Patient not taking: Reported on 08/20/2016 08/10/15   Niel Hummer, NP    Family History Family History  Problem Relation Age of Onset  . Diabetes Mother   . Hypertension Mother     Social History Social History  Substance Use Topics  . Smoking status: Current Every Day Smoker    Packs/day: 0.50    Types: Cigarettes  . Smokeless tobacco: Never Used  . Alcohol use No     Allergies   Review of patient's allergies indicates no known allergies.   Review of Systems Review of Systems  Constitutional: Negative for chills, fatigue and fever.  HENT: Negative for congestion, rhinorrhea and sore throat.   Eyes: Negative for visual disturbance.  Respiratory: Negative for cough, shortness of breath and wheezing.   Cardiovascular: Negative for chest pain and leg swelling.  Gastrointestinal: Negative for abdominal pain, diarrhea, nausea and vomiting.  Genitourinary: Negative for dysuria, flank pain, vaginal bleeding and vaginal discharge.  Musculoskeletal: Negative for neck pain and neck stiffness.  Skin: Negative for rash and wound.  Allergic/Immunologic: Negative for immunocompromised state.  Neurological: Negative for syncope, weakness and headaches.  Hematological: Does not bruise/bleed easily.  Psychiatric/Behavioral: Positive for dysphoric mood and suicidal ideas.  All other systems reviewed and are  negative.    Physical Exam Updated Vital Signs BP 121/73 (BP Location: Right Arm)   Pulse 80   Temp 98 F (36.7 C) (Oral)   Resp 18   LMP 08/17/2016   SpO2 100%   Physical Exam  Constitutional: She is oriented to person, place, and time. She appears well-developed and well-nourished. No distress.  HENT:  Head: Normocephalic and atraumatic.  Eyes: Conjunctivae are normal.  Neck: Neck supple.  Cardiovascular: Normal rate, regular rhythm and  normal heart sounds.  Exam reveals no friction rub.   No murmur heard. Pulmonary/Chest: Effort normal and breath sounds normal. No respiratory distress. She has no wheezes. She has no rales.  Abdominal: She exhibits no distension.  Musculoskeletal: She exhibits no edema.  Neurological: She is alert and oriented to person, place, and time. She exhibits normal muscle tone.  Skin: Skin is warm. Capillary refill takes less than 2 seconds.  Psychiatric: She is withdrawn. She expresses suicidal ideation. She expresses suicidal plans.  Nursing note and vitals reviewed.    ED Treatments / Results  Labs (all labs ordered are listed, but only abnormal results are displayed) Labs Reviewed  COMPREHENSIVE METABOLIC PANEL - Abnormal; Notable for the following:       Result Value   Potassium 3.4 (*)    Calcium 8.8 (*)    ALT 11 (*)    Anion gap 4 (*)    All other components within normal limits  CBC WITH DIFFERENTIAL/PLATELET - Abnormal; Notable for the following:    RDW 16.3 (*)    All other components within normal limits  URINE RAPID DRUG SCREEN, HOSP PERFORMED - Abnormal; Notable for the following:    Cocaine POSITIVE (*)    Tetrahydrocannabinol POSITIVE (*)    All other components within normal limits  ACETAMINOPHEN LEVEL - Abnormal; Notable for the following:    Acetaminophen (Tylenol), Serum <10 (*)    All other components within normal limits  ETHANOL  SALICYLATE LEVEL  I-STAT BETA HCG BLOOD, ED ([facility], [facility], [facility] ONLY)    EKG  EKG Interpretation None       Radiology No results found.  Procedures Procedures (including critical care time)  Medications Ordered in ED Medications  potassium chloride SA (K-DUR,KLOR-CON) CR tablet 40 mEq (40 mEq Oral Given 08/20/16 1643)     Initial Impression / Assessment and Plan / ED Course  I have reviewed the triage vital signs and the nursing notes.  Pertinent labs & imaging results that were available during my care of the patient were  reviewed by me and considered in my medical decision making (see chart for details).  Clinical Course  Value Comment By Time  Acetaminophen (Tylenol), S: (!) <10 (Reviewed) Duffy Bruce, MD 08/41 7348    40 year old female with past medical history of major depression who presents with worsening depression and suicidal ideation in the setting of her significant other being deported. On arrival, vital signs are stable and within normal limits. Examination is unremarkable. Suspect the patient has acute on chronic depression with worsening suicidality. I believe she presents significant risk to herself and merits emergency psychiatric consultation. Otherwise, her vital signs are stable. She has no apparent organic etiology for her symptoms. Her lab work is unremarkable. She has no fevers, chills or signs of meningitis or encephalitis. She is medically stable for psychiatric disposition  Final Clinical Impressions(s) / ED Diagnoses   Final diagnoses:  Major depressive disorder, recurrent episode, moderate degree ([place])  Suicidal ideation  New Prescriptions New Prescriptions   No medications on file     Duffy Bruce, MD 08/20/16 [phone]

## 2016-08-20 NOTE — ED Notes (Signed)
Pt reminded to give urine sample.

## 2016-08-20 NOTE — BH Assessment (Signed)
Patient accepted to [facility] Observation Unit. Accepted by Waylan Boga, DNP.The bed assignment is #5.  Per AC systolic must be below [id number] and urine sample must be collected to determine UDS prior to [facility] transfer. Support paperwork completed. Observation nursing report [phone] or [phone]. [facility] staff will need to coordinate [facility] transfer to [facility].

## 2016-08-20 NOTE — ED Notes (Signed)
Pt transported to [facility] by [facility] transportation service for continuation of specialized care. Belongings given to driver after patient signed for them. Pt left in no acute distress. 

## 2016-08-20 NOTE — Progress Notes (Signed)
[place] uninsured  Referral given to [facility]

## 2016-08-20 NOTE — Progress Notes (Signed)
Entered in d/c instructions Please use the resources provided to you in emergency room by case manager to assist you're your choice of doctor for follow up     Please follow up with the resources provided to you by  [facility]  [phone]  [email or web address]

## 2016-08-20 NOTE — ED Notes (Signed)
Pt oriented to room and unit.  Pt states she is "Very Suicidal"  But has no plan.  She becomes tearful at times when talking about her boyfriend who was deported.  She is pleasant and cooperative, eating snacks, watching TV.  15 minute checks and video monitoring in place.

## 2016-08-20 NOTE — Progress Notes (Signed)
ADMISSION NOTE: Deborah Melendez 40 yo female admitted to observation bed 5.  Pt is nervous, crying occasionally, anxious and depressed.  Pt sts her bf was deported to [place] aprox. One month ago and since his deportation PT has been worried, and depressed as well as feeling overwhelmed.  Pt reports Crack use of about $50-60 a day since bf left.  Pt was previously at [facility] but once she was feeling better, she stopped her meds. And has been off meds for about 8 months.  Pt has 23 yr old daughter with bf who lives with a relative and pt sts child has "lots of mental issues".  Pt sts she is very hungry and wants shower and to go to sleep. Pt provided with meal, towels and toiletries.  Pt offered support and encouragement.  Pt given prn sleep med. And med for anxiety.  Pt continuously observed for safety except when in the bathroom.  Pt is asleep and remains safe.

## 2016-08-20 NOTE — ED Notes (Signed)
Gave report to Eddie in [employer]

## 2016-08-20 NOTE — ED Notes (Signed)
Sitter arrived at [facility]

## 2016-08-20 NOTE — BH Assessment (Signed)
Assessment Note  Deborah Melendez is an 40 y.o. female. Patient presents to [facility] voluntarily with suicidal ideations. No suicidal plan but sts, "I haven't thought anything yet". Patient's suicidal thoughts are triggered by her boyfriend/fiance being departed back to [place]. Sts that her boyfriend was departed 3 weeks ago and since then she has felt lonely. Patient sts that her depression has also worsened with symptoms of hopelessness, fatigue, crying spells,etc. Patient has tried to commit suicide in the past by overdosing. Sts that her previous suicide attempt was triggered by depression. No HI. No AVH's. Patient reports cocaine abuse regularly.  Diagnosis:  Major Depression, Recurrent, Severe, without psychotic features and Cocaine Abuse  Past Medical History:  Past Medical History:  Diagnosis Date  . MRSA (methicillin resistant Staphylococcus aureus)    surgery on finger 3 years ago    Past Surgical History:  Procedure Laterality Date  . FINGER SURGERY      Family History:  Family History  Problem Relation Age of Onset  . Diabetes Mother   . Hypertension Mother     Social History:  reports that she has been smoking Cigarettes.  She has been smoking about 0.50 packs per day. She has never used smokeless tobacco. She reports that she does not drink alcohol or use drugs.  Additional Social History:  Alcohol / Drug Use Pain Medications: SEE MAR Prescriptions: SEE MAR Over the Counter: SEE MAR History of alcohol / drug use?: Yes Negative Consequences of Use: Financial, Personal relationships Substance #1 Name of Substance 1: Cocaine  1 - Age of First Use: 40 yrs old  1 - Amount (size/oz): "I don't know" 1 - Frequency: "every other day" 1 - Duration: on-going since the age of 60 1 - Last Use / Amount: "couple of days ago"  CIWA: CIWA-Ar BP: 161/95 Pulse Rate: 97 COWS:    Allergies: No Known Allergies  Home Medications:  (Not in a hospital admission)  OB/GYN Status:   Patient's last menstrual period was 08/17/2016.  General Assessment Data [facility] of [facility]: [facility] TTS Assessment: In system Is this a Tele or Face-to-Face Assessment?: Face-to-Face Is this an Initial Assessment or a Re-assessment for this encounter?: Initial Assessment Marital status: Single Maiden name:  (n/a) Is patient pregnant?: No Pregnancy Status: No Living Arrangements: Alone Can pt return to current living arrangement?: Yes Admission Status: Voluntary Is patient capable of signing voluntary admission?: Yes Referral Source: Self/Family/Friend Insurance type:  (MCD)  Medical Screening Exam ([place]) Medical Exam completed: No  Crisis Care Plan Living Arrangements: Alone Legal Guardian:  (no legal guardian ) Name of Psychiatrist:  (No psychiatrist ) Name of Therapist:  (No therapist )  Education Status Is patient currently in school?: No Current Grade:  (n/a) Highest grade of school patient has completed:  (11th grade ) Name of school:  (n/a) Contact person:  (n/a)  Risk to self with the past 6 months Suicidal Ideation: Yes-Currently Present Has patient been a risk to self within the past 6 months prior to admission? : Yes Suicidal Intent: Yes-Currently Present Has patient had any suicidal intent within the past 6 months prior to admission? : Yes Is patient at risk for suicide?: Yes Suicidal Plan?: Yes-Currently Present Has patient had any suicidal plan within the past 6 months prior to admission? : No Specify Current Suicidal Plan:  (n/a) Access to Means: No What has been your use of drugs/alcohol within the last 12 months?:  (patient sts that she uses cocaine frequently )  Previous Attempts/Gestures: Yes How many times?:  (1x) Other Self Harm Risks:  (n/a) Triggers for Past Attempts: Other (Comment) Intentional Self Injurious Behavior: None Family Suicide History: No Recent stressful life event(s): Other (Comment) (boyfriend departed  ) Persecutory voices/beliefs?: No Depression: Yes Depression Symptoms: Feeling angry/irritable, Feeling worthless/self pity, Loss of interest in usual pleasures, Guilt, Fatigue, Isolating, Tearfulness, Insomnia, Despondent Substance abuse history and/or treatment for substance abuse?: No Suicide prevention information given to non-admitted patients: Not applicable  Risk to Others within the past 6 months Homicidal Ideation: No Does patient have any lifetime risk of violence toward others beyond the six months prior to admission? : No Thoughts of Harm to Others: No Current Homicidal Intent: No Current Homicidal Plan: No Access to Homicidal Means: No Identified Victim:  (n/a) History of harm to others?: No Assessment of Violence: None Noted Violent Behavior Description:  (patient is calm and cooperative ) Does patient have access to weapons?: No Criminal Charges Pending?: No Does patient have a court date: No Is patient on probation?: No  Psychosis Hallucinations: None noted Delusions: None noted  Mental Status Report Appearance/Hygiene: Disheveled Eye Contact: Fair Motor Activity: Unremarkable Speech: Logical/coherent Level of Consciousness: Alert Mood: Depressed Affect: Sad Anxiety Level: None Thought Processes: Coherent, Relevant Judgement: Impaired Orientation: Person, Place, Time, Situation Obsessive Compulsive Thoughts/Behaviors: None  Cognitive Functioning Concentration: Decreased Memory: Remote Intact, Recent Intact IQ: Average Insight: Poor Impulse Control: Poor Appetite: Poor Weight Loss:  (n/a) Weight Gain:  (n/a) Sleep: Decreased Total Hours of Sleep:  ("very little") Vegetative Symptoms: None  ADLScreening [facility] Assessment Services) Patient's cognitive ability adequate to safely complete daily activities?: Yes Patient able to express need for assistance with ADLs?: Yes Independently performs ADLs?: Yes (appropriate for developmental age)  Prior  Inpatient Therapy Prior Inpatient Therapy: Yes Prior Therapy Dates:  ("I can't remember") Prior Therapy Facilty/Provider(s):  [facility]) Reason for Treatment:  (substance abuse, depression, and suicidal ideations )  Prior Outpatient Therapy Prior Outpatient Therapy: No Prior Therapy Dates:  (n/a) Prior Therapy Facilty/Provider(s):  (n/a) Reason for Treatment:  (n/a) Does patient have an ACCT team?: No Does patient have Intensive In-House Services?  : No Does patient have [facility] services? : No Does patient have P4CC services?: No  ADL Screening (condition at time of admission) Patient's cognitive ability adequate to safely complete daily activities?: Yes Is the patient deaf or have difficulty hearing?: No Does the patient have difficulty seeing, even when wearing glasses/contacts?: No Does the patient have difficulty concentrating, remembering, or making decisions?: No Patient able to express need for assistance with ADLs?: Yes Does the patient have difficulty dressing or bathing?: No Independently performs ADLs?: Yes (appropriate for developmental age) Does the patient have difficulty walking or climbing stairs?: No Weakness of Legs: None Weakness of Arms/Hands: None  Home Assistive Devices/Equipment Home Assistive Devices/Equipment: None    Abuse/Neglect Assessment (Assessment to be complete while patient is alone) Physical Abuse: Denies Verbal Abuse: Denies Sexual Abuse: Denies Exploitation of patient/patient's resources: Denies Self-Neglect: Denies Values / Beliefs Cultural Requests During Hospitalization: None Spiritual Requests During Hospitalization: None   Advance Directives (For Healthcare) Does patient have an advance directive?: No Would patient like information on creating an advanced directive?: No - patient declined information Nutrition Screen- [facility] Adult/[facility] Patient's home diet: Regular  Additional Information 1:1 In Past 12 Months?: No CIRT Risk:  No Elopement Risk: No Does patient have medical clearance?: Yes     Disposition:  Disposition Initial Assessment Completed for this Encounter: Yes Disposition of  Patient: Inpatient treatment program (Patient meets criteria for OBS unit, per Waylan Boga, DNP)  On Site Evaluation by:   Reviewed with Physician:    Waldon Merl [facility] 08/20/2016 5:48 PM

## 2016-08-20 NOTE — ED Triage Notes (Signed)
Patient states that having thoughts of SI without a plan. patient states that her boyfriend ws picked up by immigration 3 weeks ago and now she is alone and thoughts have gotten worse. patient used to see Beverly Sessions but not currently taking any medications.

## 2016-08-20 NOTE — ED Notes (Signed)
TTS at bedside. 

## 2016-08-21 DIAGNOSIS — F1420 Cocaine dependence, uncomplicated: Secondary | ICD-10-CM

## 2016-08-21 MED ORDER — FLUOXETINE HCL 20 MG PO CAPS
20.0000 mg | ORAL_CAPSULE | Freq: Every day | ORAL | Status: DC
  Administered 2016-08-23 – 2016-08-27 (×5): 20 mg via ORAL
  Filled 2016-08-21 (×9): qty 1

## 2016-08-21 MED ORDER — NICOTINE 21 MG/24HR TD PT24
21.0000 mg | MEDICATED_PATCH | Freq: Every day | TRANSDERMAL | Status: DC
  Administered 2016-08-21 – 2016-08-26 (×6): 21 mg via TRANSDERMAL
  Filled 2016-08-21 (×11): qty 1

## 2016-08-21 NOTE — Progress Notes (Signed)
Patient ID: Deborah Melendez, female   DOB: June 21, 1976, 40 y.o.   MRN: [id number] Seen this am by Mickel Baas NP and determined she would stay tonight and staff would continue to try to find her community resources. She would like to go to a rehab facility for her ongoing cocaine use. She recently lost her boyfriend when he was deported and since then she is lonely, sad and anxious. She states she does have a place to return to, but she wants to stop using. She has been put on several waiting lists, [facility] and [facility], but nothing available today. Will recheck tomorrow and continue to look for resources.

## 2016-08-21 NOTE — Progress Notes (Signed)
Pt has been put on waiting list at [facility].  Waiting list for Deborah Melendez is approximately one week.

## 2016-08-21 NOTE — Progress Notes (Signed)
D: Patient was sleeping at the time of shift change. Woke up complained of nightmares, anxiety and right leg pain of unknown origin. Presents with sad affect and mildly irritable. Denies active SI, AH/VH.  A: Staff offered due/prn meds as ordered. Support and encouragement offered to patient as needed. Constant observation for safety maintained. Will continue to monitor patient for safety and stability. R: Patient remains safe. Sleeping at this time.

## 2016-08-21 NOTE — H&P (Signed)
[facility] Admission Assessment Adult  Patient Identification: Deborah Melendez MRN:  [id number] Date of Evaluation:  08/21/2016 Chief Complaint:  Patient states "I'm at the point that I don't want to live anymore."  Principal Diagnosis: Cocaine use disorder, severe, dependence ([place]) Diagnosis:   Patient Active Problem List   Diagnosis Date Noted  . Major depressive disorder, recurrent episode, moderate degree ([place]) [F33.1] 08/20/2016  . MDD (major depressive disorder), recurrent severe, without psychosis ([place]) [F33.2] 08/08/2015  . Cocaine use disorder, severe, dependence ([place]) [F14.20] 08/08/2015  . Cigar smoker motivated to quit [F17.290] 07/06/2014  . Substance abuse [F19.10] 07/06/2014  . Pregnancy [Z33.1] 06/05/2014   History of Present Illness: Deborah Melendez is a 40 year old female who initially presented to [facility] with c/o suicidal ideations.  She was seen today, patient was upset and irritable. Patient report recent stressors of her boyfriend being deported to [place], daily cocaine usage, and lack of housing. She reports that her boyfriend was deported three weeks ago and since then she has felt lonely. Patient reports that her depression has also worsened with symptoms of hopelessness, fatigue, crying spells,etc. Patient has tried to commit suicide in the past by overdosing. She reports that her previous suicide attempt was triggered by depression. No report of homicidal thoughts or hallucinations.  Patient reports cocaine abuse regularly. Her urine drug screen was positive cocaine and marijuana. Patient denies any current outpatient follow up. She reports continued feelings of hopelessness due to her depressive symptoms. Patient reports that crack abuse has been a problem for at least twenty years. She was last inpatient at [facility] in 2016 and has completed rehab before at [facility]. The Observation staff has been exploring residential treatment options today but so far no beds at  [facility] and patient has been put on the waiting list at [facility], which is approximately a one week wait.   Associated Signs/Symptoms: Depression Symptoms:  depressed mood, anhedonia, insomnia, fatigue, hopelessness, suicidal thoughts without plan, anxiety, (Hypo) Manic Symptoms:  Irritable Mood, Labiality of Mood, Anxiety Symptoms:  Excessive Worry, Psychotic Symptoms:  NA PTSD Symptoms: NA Total Time spent with patient: 45 minutes   Past Medical History:  Past Medical History:  Diagnosis Date  . MRSA (methicillin resistant Staphylococcus aureus)    surgery on finger 3 years ago    Past Surgical History:  Procedure Laterality Date  . FINGER SURGERY     Family History:  Family History  Problem Relation Age of Onset  . Diabetes Mother   . Hypertension Mother    Social History:  History  Alcohol Use No     History  Drug Use No    Comment: 30 days sober, just completed treatment at [facility] 8/11    Social History   Social History  . Marital status: Single    Spouse name: N/A  . Number of children: N/A  . Years of education: N/A   Social History Main Topics  . Smoking status: Current Every Day Smoker    Packs/day: 0.50    Types: Cigarettes  . Smokeless tobacco: Never Used  . Alcohol use No  . Drug use: No     Comment: 30 days sober, just completed treatment at [facility] 8/11  . Sexual activity: Yes    Birth control/ protection: None   Other Topics Concern  . None   Social History Narrative  . None   Additional Social History:  Musculoskeletal: Strength & Muscle Tone: within normal limits Gait & Station: normal Patient leans:  N/A  Psychiatric Specialty Exam: Physical Exam  Vitals reviewed. Psychiatric: Her mood appears anxious. Her affect is blunt and labile.    ROS  Blood pressure 128/89, pulse 73, temperature 98.9 F (37.2 C), temperature source Oral, resp. rate 16, height 5' 2"  (1.575 m), weight 50.1 kg (110 lb 8 oz), last menstrual period  08/17/2016, SpO2 100 %.Body mass index is 20.21 kg/m.   General Appearance: Fairly Groomed  Engineer, water:: Fair  Speech: Normal Rate  Volume: Increased  Mood: Anxious and Irritable  Affect: Labile  Thought Process: Goal Directed  Orientation: Full (Time, Place, and Person)  Thought Content: Rumination  Suicidal Thoughts:Passive SI with no plan  Homicidal Thoughts: No  Memory: Immediate; Fair Recent; Fair Remote; Fair  Judgement: Impaired  Insight: Shallow  Psychomotor Activity: Increased and Restlessness  Concentration: Poor  Recall: [employer] of Knowledge:Fair  Language: Fair  Akathisia: No  Handed: Right  AIMS (if indicated):    Assets: Communication Skills  Sleep:Fair  Cognition: WNL  ADL's: Intact       Risk to Self: Is patient at risk for suicide?: yes due to severe psychosocial stressors Risk to Others:  No Prior Inpatient Therapy:   Yes Prior Outpatient Therapy:  Yes  Alcohol Screening:    Allergies:  No Known Allergies Lab Results:  Results for orders placed or performed during the hospital encounter of 08/20/16 (from the past 48 hour(s))  Urine rapid drug screen (hosp performed)not at [facility]     Status: Abnormal   Collection Time: 08/20/16 12:40 PM  Result Value Ref Range   Opiates NONE DETECTED NONE DETECTED   Cocaine POSITIVE (A) NONE DETECTED   Benzodiazepines NONE DETECTED NONE DETECTED   Amphetamines NONE DETECTED NONE DETECTED   Tetrahydrocannabinol POSITIVE (A) NONE DETECTED   Barbiturates NONE DETECTED NONE DETECTED    Comment:        DRUG SCREEN FOR MEDICAL PURPOSES ONLY.  IF CONFIRMATION IS NEEDED FOR ANY PURPOSE, NOTIFY LAB WITHIN 5 DAYS.        LOWEST DETECTABLE LIMITS FOR URINE DRUG SCREEN Drug Class       Cutoff (ng/mL) Amphetamine      1000 Barbiturate      200 Benzodiazepine   038 Tricyclics       882 Opiates          300 Cocaine          300 THC              50    Comprehensive metabolic panel     Status: Abnormal   Collection Time: 08/20/16  1:11 PM  Result Value Ref Range   Sodium 139 135 - 145 mmol/L   Potassium 3.4 (L) 3.5 - 5.1 mmol/L   Chloride 111 101 - 111 mmol/L   CO2 24 22 - 32 mmol/L   Glucose, Bld 93 65 - 99 mg/dL   BUN 12 6 - 20 mg/dL   Creatinine, Ser 0.84 0.44 - 1.00 mg/dL   Calcium 8.8 (L) 8.9 - 10.3 mg/dL   Total Protein 7.6 6.5 - 8.1 g/dL   Albumin 4.0 3.5 - 5.0 g/dL   AST 18 15 - 41 U/L   ALT 11 (L) 14 - 54 U/L   Alkaline Phosphatase 51 38 - 126 U/L   Total Bilirubin 0.4 0.3 - 1.2 mg/dL   GFR calc non Af Amer >60 >60 mL/min   GFR calc Af Amer >60 >60 mL/min    Comment: (NOTE) The  eGFR has been calculated using the CKD EPI equation. This calculation has not been validated in all clinical situations. eGFR's persistently <60 mL/min signify possible Chronic Kidney Disease.    Anion gap 4 (L) 5 - 15  Ethanol     Status: None   Collection Time: 08/20/16  1:11 PM  Result Value Ref Range   Alcohol, Ethyl (B) <5 <5 mg/dL    Comment:        LOWEST DETECTABLE LIMIT FOR SERUM ALCOHOL IS 5 mg/dL FOR MEDICAL PURPOSES ONLY   CBC with Diff     Status: Abnormal   Collection Time: 08/20/16  1:11 PM  Result Value Ref Range   WBC 4.4 4.0 - 10.5 K/uL   RBC 4.18 3.87 - 5.11 MIL/uL   Hemoglobin 12.5 12.0 - 15.0 g/dL   HCT 37.3 36.0 - 46.0 %   MCV 89.2 78.0 - 100.0 fL   MCH 29.9 26.0 - 34.0 pg   MCHC 33.5 30.0 - 36.0 g/dL   RDW 16.3 (H) 11.5 - 15.5 %   Platelets 239 150 - 400 K/uL   Neutrophils Relative % 50 %   Neutro Abs 2.2 1.7 - 7.7 K/uL   Lymphocytes Relative 37 %   Lymphs Abs 1.6 0.7 - 4.0 K/uL   Monocytes Relative 6 %   Monocytes Absolute 0.3 0.1 - 1.0 K/uL   Eosinophils Relative 6 %   Eosinophils Absolute 0.3 0.0 - 0.7 K/uL   Basophils Relative 1 %   Basophils Absolute 0.0 0.0 - 0.1 K/uL  Acetaminophen level     Status: Abnormal   Collection Time: 08/20/16  1:11 PM  Result Value Ref Range   Acetaminophen  (Tylenol), Serum <10 (L) 10 - 30 ug/mL    Comment:        THERAPEUTIC CONCENTRATIONS VARY SIGNIFICANTLY. A RANGE OF 10-30 ug/mL MAY BE AN EFFECTIVE CONCENTRATION FOR MANY PATIENTS. HOWEVER, SOME ARE BEST TREATED AT CONCENTRATIONS OUTSIDE THIS RANGE. ACETAMINOPHEN CONCENTRATIONS >150 ug/mL AT 4 HOURS AFTER INGESTION AND >50 ug/mL AT 12 HOURS AFTER INGESTION ARE OFTEN ASSOCIATED WITH TOXIC REACTIONS.   Salicylate level     Status: None   Collection Time: 08/20/16  1:11 PM  Result Value Ref Range   Salicylate Lvl <1.1 2.8 - 30.0 mg/dL  I-Stat Beta hCG blood, ED ([facility], [facility], [facility] only)     Status: None   Collection Time: 08/20/16  1:24 PM  Result Value Ref Range   I-stat hCG, quantitative <5.0 <5 mIU/mL   Comment 3            Comment:   GEST. AGE      CONC.  (mIU/mL)   <=1 WEEK        5 - 50     2 WEEKS       50 - 500     3 WEEKS       100 - 10,000     4 WEEKS     1,000 - 30,000        FEMALE AND NON-PREGNANT FEMALE:     LESS THAN 5 mIU/mL    Current Medications: Current Facility-Administered Medications  Medication Dose Route Frequency Provider Last Rate Last Dose  . acetaminophen (TYLENOL) tablet 650 mg  650 mg Oral Q6H PRN Patrecia Pour, NP      . alum & mag hydroxide-simeth (MAALOX/MYLANTA) 200-200-20 MG/5ML suspension 30 mL  30 mL Oral Q4H PRN Patrecia Pour, NP      . Derrill Memo  ON 08/22/2016] FLUoxetine (PROZAC) capsule 20 mg  20 mg Oral Daily Niel Hummer, NP      . hydrOXYzine (ATARAX/VISTARIL) tablet 25 mg  25 mg Oral Q6H Patrecia Pour, NP   25 mg at 08/21/16 1200  . magnesium hydroxide (MILK OF MAGNESIA) suspension 30 mL  30 mL Oral Daily PRN Patrecia Pour, NP      . nicotine (NICODERM CQ - dosed in mg/24 hours) patch 21 mg  21 mg Transdermal Daily Serena Colonel, RN   21 mg at 08/21/16 0737  . traZODone (DESYREL) tablet 50 mg  50 mg Oral QHS Patrecia Pour, NP   50 mg at 08/20/16 2143   PTA Medications: Prescriptions Prior to Admission  Medication Sig Dispense Refill Last  Dose  . FLUoxetine (PROZAC) 20 MG capsule Take 1 capsule (20 mg total) by mouth daily. (Patient not taking: Reported on 08/20/2016) 30 capsule 0 Not Taking at Unknown time  . hydrOXYzine (ATARAX/VISTARIL) 25 MG tablet Take 1 tablet (25 mg total) by mouth every 6 (six) hours. (Patient not taking: Reported on 07/15/2015) 12 tablet 0 Not Taking at Unknown time  . traZODone (DESYREL) 50 MG tablet Take 1 tablet (50 mg total) by mouth at bedtime. (Patient not taking: Reported on 08/20/2016) 30 tablet 0 Not Taking at Unknown time    Previous Psychotropic Medications: Yes  Prozac but has not been able to afford the medications or make the appointment  Substance Abuse History in the last 12 months:  Yes.  Current urine drug screen is positive for cocaine  Consequences of Substance Abuse: agitation, anxiety, irritability  Results for orders placed or performed during the hospital encounter of 08/20/16 (from the past 72 hour(s))  Urine rapid drug screen (hosp performed)not at [facility]     Status: Abnormal   Collection Time: 08/20/16 12:40 PM  Result Value Ref Range   Opiates NONE DETECTED NONE DETECTED   Cocaine POSITIVE (A) NONE DETECTED   Benzodiazepines NONE DETECTED NONE DETECTED   Amphetamines NONE DETECTED NONE DETECTED   Tetrahydrocannabinol POSITIVE (A) NONE DETECTED   Barbiturates NONE DETECTED NONE DETECTED    Comment:        DRUG SCREEN FOR MEDICAL PURPOSES ONLY.  IF CONFIRMATION IS NEEDED FOR ANY PURPOSE, NOTIFY LAB WITHIN 5 DAYS.        LOWEST DETECTABLE LIMITS FOR URINE DRUG SCREEN Drug Class       Cutoff (ng/mL) Amphetamine      1000 Barbiturate      200 Benzodiazepine   867 Tricyclics       619 Opiates          300 Cocaine          300 THC              50   Comprehensive metabolic panel     Status: Abnormal   Collection Time: 08/20/16  1:11 PM  Result Value Ref Range   Sodium 139 135 - 145 mmol/L   Potassium 3.4 (L) 3.5 - 5.1 mmol/L   Chloride 111 101 - 111 mmol/L   CO2 24  22 - 32 mmol/L   Glucose, Bld 93 65 - 99 mg/dL   BUN 12 6 - 20 mg/dL   Creatinine, Ser 0.84 0.44 - 1.00 mg/dL   Calcium 8.8 (L) 8.9 - 10.3 mg/dL   Total Protein 7.6 6.5 - 8.1 g/dL   Albumin 4.0 3.5 - 5.0 g/dL   AST 18 15 - 41 U/L  ALT 11 (L) 14 - 54 U/L   Alkaline Phosphatase 51 38 - 126 U/L   Total Bilirubin 0.4 0.3 - 1.2 mg/dL   GFR calc non Af Amer >60 >60 mL/min   GFR calc Af Amer >60 >60 mL/min    Comment: (NOTE) The eGFR has been calculated using the CKD EPI equation. This calculation has not been validated in all clinical situations. eGFR's persistently <60 mL/min signify possible Chronic Kidney Disease.    Anion gap 4 (L) 5 - 15  Ethanol     Status: None   Collection Time: 08/20/16  1:11 PM  Result Value Ref Range   Alcohol, Ethyl (B) <5 <5 mg/dL    Comment:        LOWEST DETECTABLE LIMIT FOR SERUM ALCOHOL IS 5 mg/dL FOR MEDICAL PURPOSES ONLY   CBC with Diff     Status: Abnormal   Collection Time: 08/20/16  1:11 PM  Result Value Ref Range   WBC 4.4 4.0 - 10.5 K/uL   RBC 4.18 3.87 - 5.11 MIL/uL   Hemoglobin 12.5 12.0 - 15.0 g/dL   HCT 37.3 36.0 - 46.0 %   MCV 89.2 78.0 - 100.0 fL   MCH 29.9 26.0 - 34.0 pg   MCHC 33.5 30.0 - 36.0 g/dL   RDW 16.3 (H) 11.5 - 15.5 %   Platelets 239 150 - 400 K/uL   Neutrophils Relative % 50 %   Neutro Abs 2.2 1.7 - 7.7 K/uL   Lymphocytes Relative 37 %   Lymphs Abs 1.6 0.7 - 4.0 K/uL   Monocytes Relative 6 %   Monocytes Absolute 0.3 0.1 - 1.0 K/uL   Eosinophils Relative 6 %   Eosinophils Absolute 0.3 0.0 - 0.7 K/uL   Basophils Relative 1 %   Basophils Absolute 0.0 0.0 - 0.1 K/uL  Acetaminophen level     Status: Abnormal   Collection Time: 08/20/16  1:11 PM  Result Value Ref Range   Acetaminophen (Tylenol), Serum <10 (L) 10 - 30 ug/mL    Comment:        THERAPEUTIC CONCENTRATIONS VARY SIGNIFICANTLY. A RANGE OF 10-30 ug/mL MAY BE AN EFFECTIVE CONCENTRATION FOR MANY PATIENTS. HOWEVER, SOME ARE BEST TREATED AT  CONCENTRATIONS OUTSIDE THIS RANGE. ACETAMINOPHEN CONCENTRATIONS >150 ug/mL AT 4 HOURS AFTER INGESTION AND >50 ug/mL AT 12 HOURS AFTER INGESTION ARE OFTEN ASSOCIATED WITH TOXIC REACTIONS.   Salicylate level     Status: None   Collection Time: 08/20/16  1:11 PM  Result Value Ref Range   Salicylate Lvl <4.6 2.8 - 30.0 mg/dL  I-Stat Beta hCG blood, ED ([facility], [facility], [facility] only)     Status: None   Collection Time: 08/20/16  1:24 PM  Result Value Ref Range   I-stat hCG, quantitative <5.0 <5 mIU/mL   Comment 3            Comment:   GEST. AGE      CONC.  (mIU/mL)   <=1 WEEK        5 - 50     2 WEEKS       50 - 500     3 WEEKS       100 - 10,000     4 WEEKS     1,000 - 30,000        FEMALE AND NON-PREGNANT FEMALE:     LESS THAN 5 mIU/mL     Observation Level/Precautions:  Continuous Observation  Laboratory:  per ED  Psychotherapy: Individual to  address psychosocial stressors   Medications:  As per medlist  Consultations:  As needed  Discharge Concerns:  Safety  Estimated LOS: 24-48 hours  Other:     Psychological Evaluations: Yes   Treatment Plan Summary: Admit for crisis management and mood stabilization to the [facility].   Counseling provided, health education to help patient understand why observation status to ensure safety Medication management to re-stabilize current mood symptoms. Increase Prozac to 20 mg daily for depressive symptoms, Hydroxyzine 25 mg Q6H prn anixety Individual counseling sessions for coping skills Medical consults as needed Review and reinstate any pertinent home medications for other health problems Possible discharge from [facility] tomorrow with appropriate resources.   Elmarie Shiley, NP-C 8/25/20173:14 PM

## 2016-08-21 NOTE — Progress Notes (Signed)
[facility] has no beds available.

## 2016-08-22 DIAGNOSIS — F1420 Cocaine dependence, uncomplicated: Secondary | ICD-10-CM | POA: Diagnosis not present

## 2016-08-22 MED ORDER — HYDROXYZINE HCL 25 MG PO TABS: 25.0000 mg | ORAL_TABLET | Freq: Four times a day (QID) | ORAL | 0 refills | Status: DC

## 2016-08-22 MED ORDER — TRAZODONE HCL 50 MG PO TABS: 50.0000 mg | ORAL_TABLET | Freq: Every day | ORAL | 0 refills | Status: DC

## 2016-08-22 MED ORDER — FLUOXETINE HCL 20 MG PO CAPS: 20.0000 mg | ORAL_CAPSULE | Freq: Every day | ORAL | 0 refills | Status: DC

## 2016-08-22 MED ORDER — GABAPENTIN 100 MG PO CAPS
100.0000 mg | ORAL_CAPSULE | Freq: Three times a day (TID) | ORAL | Status: DC
  Administered 2016-08-22 – 2016-08-28 (×18): 100 mg via ORAL
  Filled 2016-08-22 (×25): qty 1

## 2016-08-22 NOTE — Progress Notes (Signed)
Patient ID: Deborah Melendez, female   DOB: 1976-01-25, 40 y.o.   MRN: [id number]   Pt was seen and assessed again after she reported to this writer that if she was discharged she would go home and kill herself. Pt was seen by Theodoro Grist, and Dr. Parke Poisson it was decided that her discharge would be cancelled. Pt remains very agitated and irritable, she continued to reported that she needed long term treatment. Pt continued to report that she was positive SI, but was able to contract for safety. Pt did refuse medication as she reported that "we did not care about her and that she was asking for help and we were overlooking her". Pt reported that her depression was a 10, her hopelessness was a 10, and her anxiety was a 10. No other issues or concerns noted.

## 2016-08-22 NOTE — Progress Notes (Signed)
Patient ID: Deborah Melendez, female   DOB: 1976/04/09, 40 y.o.   MRN: [id number]  This writer was attempting to discharge patient with outpatient referrals. Pt reported that she could not go home and that if she was released she would just be right back because  She needs help. Pt reported that she did not know what she would do to herself if she got released. Pt reported that she was positive SI, no plan reported. Pt remained very agitated and irritable. Pt reported that she just needed help. Mickel Baas NP made aware of situation, no new orders noted at this time.

## 2016-08-22 NOTE — Progress Notes (Signed)
[facility] Progress Note  08/22/2016 3:38 PM Deborah Melendez  MRN:  [id number] Subjective:    Patient states "I need some help. I don't feel stable or safe. I need long term treatment for my substance abuse."  Objective:   Patient is seen and chart is reviewed. She is noted to be very irritable during the assessment. Yesterday the patient was placed on waiting list for [facility]. Patient was upset over not being admitted directly to a treatment facility. She also mentioned depressive symptoms over her significant other being deported back to [place]. Patient was found stable for discharge with outpatient resources but expressed suicidal ideation when staff attempted to discharge her. Informed [facility] and Dr. Parke Poisson of patient's symptoms. Dr. Parke Poisson felt that further observation was warranted. Patient's dose of prozac increased and neurontin 100 mg TID started to help with agitation.   Principal Problem: Cocaine use disorder, severe, dependence ([place]) Diagnosis:   Patient Active Problem List   Diagnosis Date Noted  . Major depressive disorder, recurrent episode, moderate degree ([place]) [F33.1] 08/20/2016  . MDD (major depressive disorder), recurrent severe, without psychosis ([place]) [F33.2] 08/08/2015  . Cocaine use disorder, severe, dependence ([place]) [F14.20] 08/08/2015  . Cigar smoker motivated to quit [F17.290] 07/06/2014  . Substance abuse [F19.10] 07/06/2014  . Pregnancy [Z33.1] 06/05/2014   Total Time spent with patient: 20 minutes  Past Psychiatric History: Cocaine abuse, depression  Past Medical History:  Past Medical History:  Diagnosis Date  . MRSA (methicillin resistant Staphylococcus aureus)    surgery on finger 3 years ago    Past Surgical History:  Procedure Laterality Date  . FINGER SURGERY     Family History:  Family History  Problem Relation Age of Onset  . Diabetes Mother   . Hypertension Mother    Family Psychiatric  History: See H & P Social  History:  History  Alcohol Use No     History  Drug Use No    Comment: 30 days sober, just completed treatment at [facility] 8/11    Social History   Social History  . Marital status: Single    Spouse name: N/A  . Number of children: N/A  . Years of education: N/A   Social History Main Topics  . Smoking status: Current Every Day Smoker    Packs/day: 0.50    Types: Cigarettes  . Smokeless tobacco: Never Used  . Alcohol use No  . Drug use: No     Comment: 30 days sober, just completed treatment at [facility] 8/11  . Sexual activity: Yes    Birth control/ protection: None   Other Topics Concern  . None   Social History Narrative  . None   Additional Social History:    Pain Medications: SEE MAR Prescriptions: SEE MAR Over the Counter: SEE MAR History of alcohol / drug use?: Yes Longest period of sobriety (when/how long): does not recall  Negative Consequences of Use: Financial, Personal relationships Withdrawal Symptoms: Agitation, Irritability, Nausea / Vomiting, Sweats Name of Substance 1: Cocaine  1 - Age of First Use: 39 yrs old  1 - Amount (size/oz): "I don't know" 1 - Frequency: "every other day" 1 - Duration: on-going since the age of 64 1 - Last Use / Amount: "couple of days ago"                  Sleep: Fair  Appetite:  Fair  Current Medications: Current Facility-Administered Medications  Medication Dose Route Frequency Provider Last Rate  Last Dose  . acetaminophen (TYLENOL) tablet 650 mg  650 mg Oral Q6H PRN Patrecia Pour, NP   650 mg at 08/21/16 2119  . alum & mag hydroxide-simeth (MAALOX/MYLANTA) 200-200-20 MG/5ML suspension 30 mL  30 mL Oral Q4H PRN Patrecia Pour, NP      . FLUoxetine (PROZAC) capsule 20 mg  20 mg Oral Daily Niel Hummer, NP      . gabapentin (NEURONTIN) capsule 100 mg  100 mg Oral TID Niel Hummer, NP      . hydrOXYzine (ATARAX/VISTARIL) tablet 25 mg  25 mg Oral Q6H Patrecia Pour, NP   25 mg at 08/21/16 2119  . magnesium  hydroxide (MILK OF MAGNESIA) suspension 30 mL  30 mL Oral Daily PRN Patrecia Pour, NP      . nicotine (NICODERM CQ - dosed in mg/24 hours) patch 21 mg  21 mg Transdermal Daily Serena Colonel, RN   21 mg at 08/22/16 [id number]  . traZODone (DESYREL) tablet 50 mg  50 mg Oral QHS Patrecia Pour, NP   50 mg at 08/21/16 2119    Lab Results: No results found for this or any previous visit (from the past 73 hour(s)).  Blood Alcohol level:  Lab Results  Component Value Date   ETH <5 08/20/2016   ETH <5 [id number]    Metabolic Disorder Labs: No results found for: HGBA1C, MPG No results found for: PROLACTIN No results found for: CHOL, TRIG, HDL, CHOLHDL, VLDL, LDLCALC  Physical Findings: AIMS: Facial and Oral Movements Muscles of Facial Expression: None, normal Lips and Perioral Area: None, normal Jaw: None, normal Tongue: None, normal,Extremity Movements Upper (arms, wrists, hands, fingers): None, normal Lower (legs, knees, ankles, toes): None, normal, Trunk Movements Neck, shoulders, hips: None, normal, Overall Severity Severity of abnormal movements (highest score from questions above): None, normal Incapacitation due to abnormal movements: None, normal Patient's awareness of abnormal movements (rate only patient's report): No Awareness, Dental Status Current problems with teeth and/or dentures?: No (missing several teeth in front) Does patient usually wear dentures?: No  CIWA:    COWS:  COWS Total Score: 0  Musculoskeletal: Strength & Muscle Tone: within normal limits Gait & Station: normal Patient leans: N/A  Psychiatric Specialty Exam: Physical Exam  Review of Systems  Psychiatric/Behavioral: Positive for depression, substance abuse and suicidal ideas. Negative for hallucinations and memory loss. The patient is nervous/anxious and has insomnia.     Blood pressure 129/87, pulse 78, temperature 98.1 F (36.7 C), temperature source Oral, resp. rate 16, height 5\' 2"  (1.575 m), weight  50.1 kg (110 lb 8 oz), last menstrual period 08/17/2016, SpO2 100 %.Body mass index is 20.21 kg/m.  General Appearance: Disheveled  Eye Contact:  Fair  Speech:  Clear and Coherent  Volume:  Varies, increased when agitated   Mood:  Angry and Irritable  Affect:  Labile  Thought Process:  Coherent  Orientation:  Full (Time, Place, and Person)  Thought Content:  Rumination  Suicidal Thoughts:  Yes.  without intent/plan  Homicidal Thoughts:  No  Memory:  Immediate;   Fair Recent;   Fair Remote;   Poor  Judgement:  Impaired  Insight:  Lacking  Psychomotor Activity:  Normal  Concentration:  Concentration: Fair and Attention Span: Fair  Recall:  [employer] of Knowledge:  Fair  Language:  Good  Akathisia:  No  Handed:  Right  AIMS (if indicated):     Assets:  Communication Skills Desire for Improvement  Resilience  ADL's:  Intact  Cognition:  WNL  Sleep:       Treatment Plan Summary: Daily contact with patient to assess and evaluate symptoms and progress in treatment and Medication management  -Increase Prozac to 20 mg daily for depressive symptoms -Start Neurontin 100 mg TID for agitation/mood control -Continue Trazodone 50 mg hs for insomnia -Continue vistaril 25 mg every six hours prn anxiety.   Elmarie Shiley, NP 08/22/2016, 3:38 PM   Agree with NP note as above

## 2016-08-23 DIAGNOSIS — Z915 Personal history of self-harm: Secondary | ICD-10-CM | POA: Diagnosis not present

## 2016-08-23 DIAGNOSIS — G4700 Insomnia, unspecified: Secondary | ICD-10-CM | POA: Diagnosis present

## 2016-08-23 DIAGNOSIS — F329 Major depressive disorder, single episode, unspecified: Secondary | ICD-10-CM | POA: Diagnosis present

## 2016-08-23 DIAGNOSIS — Z818 Family history of other mental and behavioral disorders: Secondary | ICD-10-CM | POA: Diagnosis not present

## 2016-08-23 DIAGNOSIS — Z79899 Other long term (current) drug therapy: Secondary | ICD-10-CM | POA: Diagnosis not present

## 2016-08-23 DIAGNOSIS — Z8249 Family history of ischemic heart disease and other diseases of the circulatory system: Secondary | ICD-10-CM | POA: Diagnosis not present

## 2016-08-23 DIAGNOSIS — F17210 Nicotine dependence, cigarettes, uncomplicated: Secondary | ICD-10-CM | POA: Diagnosis present

## 2016-08-23 DIAGNOSIS — R45851 Suicidal ideations: Secondary | ICD-10-CM | POA: Diagnosis present

## 2016-08-23 DIAGNOSIS — F1290 Cannabis use, unspecified, uncomplicated: Secondary | ICD-10-CM | POA: Diagnosis present

## 2016-08-23 DIAGNOSIS — F331 Major depressive disorder, recurrent, moderate: Secondary | ICD-10-CM | POA: Diagnosis not present

## 2016-08-23 MED ORDER — TRAZODONE HCL 100 MG PO TABS
100.0000 mg | ORAL_TABLET | Freq: Every day | ORAL | Status: DC
  Administered 2016-08-23 – 2016-08-25 (×3): 100 mg via ORAL
  Filled 2016-08-23 (×6): qty 1

## 2016-08-23 NOTE — Progress Notes (Signed)
Adult Psychoeducational Group Note  Date:  08/23/2016 Time:  9:55 PM  Group Topic/Focus:  Wrap-Up Group:   The focus of this group is to help patients review their daily goal of treatment and discuss progress on daily workbooks.   Participation Level:  Active  Participation Quality:  Appropriate  Affect:  Appropriate  Cognitive:  Appropriate and Oriented  Insight: Appropriate  Engagement in Group:  Engaged  Modes of Intervention:  Discussion  Additional Comments:   Patient attended wrap-up group and said that her day 5.  Her goal for today was to be a better person and she is continuously working to meet that goal. She also expressed that she did not developed any coping skills as yet, but she is working on it.  Austin Herd W Shayna Eblen [id number], 9:55 PM

## 2016-08-23 NOTE — Plan of Care (Signed)
Problem: Medication: Goal: Compliance with prescribed medication regimen will improve Outcome: Progressing Pt has been compliant with scheduled medications tonight.

## 2016-08-23 NOTE — Progress Notes (Signed)
D: Pt was in the day room upon initial approach.  Pt has depressed affect and mood.  She describes her day as "mediocre."  Pt reports her goal is to "get in [facility], I can't really go home because everything there reminds me of my fiance."  Pt reports she has been feeling "nervous" and has been having "panic attacks" today.  Pt denies SI/HI, denies hallucinations, reports left knee pain of 5/10.  Pt has been visible in milieu interacting with peers and staff appropriately.  Pt attended evening group.   A: Introduced self to pt.  Met with pt and offered support and encouragement.  Actively listened to pt.  Medications administered per order.  PRN medication administered for pain. R: Pt is compliant with medications.  Pt verbally contracts for safety.  Will continue to monitor and assess.

## 2016-08-23 NOTE — Progress Notes (Signed)
Patient assessed this morning in the [facility]. She continues to be irritable stating "I am the same as yesterday. Depressed and suicidal. I want to overdose on pills. I have gotten no better in here." Discussed with Dr. Parke Poisson that patient's symptoms warrant inpatient admission. She has been accepted to [facility] and will be transferred later today. Her Trazodone was increased to 100 mg at hs to help with complaints of insomnia.

## 2016-08-23 NOTE — Progress Notes (Signed)
Patient ID: Deborah Melendez, female   DOB: 1976/09/30, 40 y.o.   MRN: [id number] Discharge Note-Laura D NP saw her this am and decision made for her to be transferred to the adult unit, [facility]. She has been wanting to be transferred to the adult unit, and is happy to be going. States she is so sad and scared with her boyfriend being deported potentially by ICE she is unable to sleep at night or function. She is feeling hopeless and suicidal but denies a current plan. She has been pleasant and cooperative in OBS. She has notified family of her transfer. Her daughter is being cared for by her boyfriends sister. Reported off to [employer] for transfer to adult unit.

## 2016-08-23 NOTE — Progress Notes (Signed)
D: Pt denies SI/HI/AV. Pt is pleasant and cooperative. Patient has a sad and flat affect.  A: Pt was offered support and encouragement. Pt was given scheduled medications. Patient with continued observation for safety. Patient remains safe on unit. R:Pt interacts well with peers and staff. Pt is taking medication. Pt has no complaints.Pt receptive to treatment and safety maintained on unit.

## 2016-08-23 NOTE — BHH Group Notes (Signed)
CSW attempted to complete PSA with patient once she transferred to [facility] from OBS unit. Patient was outside thus PSA no completed.  Sheilah Pigeon, LCSW

## 2016-08-23 NOTE — Tx Team (Signed)
Initial Treatment Plan 08/23/2016 5:37 PM [name] [id number]    PATIENT STRESSORS: Financial difficulties Medication change or noncompliance Substance abuse   PATIENT STRENGTHS: Capable of independent living General fund of knowledge Motivation for treatment/growth   PATIENT IDENTIFIED PROBLEMS: Suicide Risk  Substance Abuse  Depressed Mood                 DISCHARGE CRITERIA:  Improved stabilization in mood, thinking, and/or behavior Motivation to continue treatment in a less acute level of care Safe-care adequate arrangements made Verbal commitment to aftercare and medication compliance  PRELIMINARY DISCHARGE PLAN: Attend PHP/IOP Attend 12-step recovery group Outpatient therapy  PATIENT/FAMILY INVOLVEMENT: This treatment plan has been presented to and reviewed with the patient, Deborah Melendez.  The patient and family have been given the opportunity to ask questions and make suggestions.  Dola Factor, RN 08/23/2016, 5:37 PM

## 2016-08-23 NOTE — Progress Notes (Signed)
Patient ID: Deborah Melendez, female   DOB: August 23, 1976, 40 y.o.   MRN: [id number] Writer late passing meds due to her sleeping. Once awake states yesterday she had a bit of a melt down because Dr was discharging her and she didn't feel safe to go and to be alone.Informed of the plan per todays [facility] she will be transferred to the adult unit. States this is what she wants, and what she wanted from the beginning. She brightens today and asked Probation officer what I did yesterday. I had been in OBS with her on fri. She is resting most of this am.

## 2016-08-24 DIAGNOSIS — R45851 Suicidal ideations: Secondary | ICD-10-CM

## 2016-08-24 DIAGNOSIS — Z818 Family history of other mental and behavioral disorders: Secondary | ICD-10-CM

## 2016-08-24 DIAGNOSIS — F331 Major depressive disorder, recurrent, moderate: Principal | ICD-10-CM

## 2016-08-24 DIAGNOSIS — Z8249 Family history of ischemic heart disease and other diseases of the circulatory system: Secondary | ICD-10-CM

## 2016-08-24 DIAGNOSIS — Z79899 Other long term (current) drug therapy: Secondary | ICD-10-CM

## 2016-08-24 LAB — HCG, SERUM, QUALITATIVE: PREG SERUM: NEGATIVE

## 2016-08-24 MED ORDER — ENSURE ENLIVE PO LIQD
237.0000 mL | Freq: Two times a day (BID) | ORAL | Status: DC
  Administered 2016-08-24 – 2016-08-28 (×9): 237 mL via ORAL

## 2016-08-24 NOTE — Progress Notes (Signed)
NUTRITION ASSESSMENT  Pt identified as at risk on the Malnutrition Screen Tool  INTERVENTION: 1. Supplements: Ensure Enlive po BID, each supplement provides 350 kcal and 20 grams of protein  NUTRITION DIAGNOSIS: Unintentional weight loss related to sub-optimal intake as evidenced by pt report.   Goal: Pt to meet >/= 90% of their estimated nutrition needs.  Monitor:  PO intake  Assessment:  Pt admitted with depression and cocaine use. Per weight history, pt has lost 20 lb since 6/30 (15% wt loss x 2 months, significant for time frame). Pt would benefit from nutritional supplementation, RD will order.  Height: Ht Readings from Last 1 Encounters:  08/23/16 5\' 2"  (1.575 m)    Weight: Wt Readings from Last 1 Encounters:  08/23/16 110 lb 12.8 oz (50.3 kg)    Weight Hx: Wt Readings from Last 10 Encounters:  08/23/16 110 lb 12.8 oz (50.3 kg)  06/26/16 130 lb (59 kg)  08/08/15 131 lb (59.4 kg)  07/06/14 148 lb 11.2 oz (67.4 kg)  06/05/14 160 lb (72.6 kg)  04/14/13 160 lb (72.6 kg)    BMI:  Body mass index is 20.27 kg/m. Pt meets criteria for normal range based on current BMI.  Estimated Nutritional Needs: Kcal: 25-30 kcal/kg Protein: > 1 gram protein/kg Fluid: 1 ml/kcal  Diet Order: Diet regular Room service appropriate? Yes; Fluid consistency: Thin Pt is also offered choice of unit snacks mid-morning and mid-afternoon.  Pt is eating as desired.   Lab results and medications reviewed.   Clayton Bibles, MS, RD, LDN Pager: [phone] After Hours Pager: [phone]

## 2016-08-24 NOTE — Progress Notes (Signed)
D: Patient reports fair sleep; good appetite; low energy and poor concentration.  She has isolated to her room today, electing to not attend groups.  Patient rates her depression and hopelessness as a 6.  She denies any withdrawal symptoms.  She complains of pain in her right knee which she was given tylenol.  Patient reports passive thoughts of self harm.  She states they have decreased since admission.  She denies HI/AVH.  She presents with flat, blunted affect and sad, depressed mood.  She is withdrawn and has minimal interactions with her peers. A: Continue to monitor medication management and MD orders.  Safety checks completed every 15 minutes per protocol. Offer support and encouragement as needed. R: Patient is isolative and withdrawn.

## 2016-08-24 NOTE — Progress Notes (Signed)
Adult Psychoeducational Group Note  Date:  08/24/2016 Time:  9:09 PM  Group Topic/Focus:  Wrap-Up Group:   The focus of this group is to help patients review their daily goal of treatment and discuss progress on daily workbooks.   Participation Level:  Active  Participation Quality:  Appropriate  Affect:  Appropriate  Cognitive:  Alert  Insight: Appropriate  Engagement in Group:  Engaged  Modes of Intervention:  Discussion  Additional Comments:  Patient states, "I had a fair day". Patient states she did not have a goal for today, nothing positive happened today, but she do feel a little better.  Deborah Melendez Deborah Melendez 08/24/2016, 9:09 PM

## 2016-08-24 NOTE — BHH Group Notes (Signed)
[facility] LCSW Aftercare Discharge Planning Group Note   08/24/2016 12:58 PM  Participation Quality:  Invited, did not attend.  Beverely Pace

## 2016-08-24 NOTE — BHH Group Notes (Signed)
[facility] LCSW Group Therapy  08/24/2016 1:15pm  Type of Therapy:  Group Therapy vercoming Obstacles  Pt did not attend, declined invitation.   Peri Maris, [place] 08/24/2016 3:32 PM

## 2016-08-24 NOTE — H&P (Signed)
Psychiatric Admission Assessment Adult  Patient Identification: Deborah Melendez MRN:  [id number] Date of Evaluation:  08/24/2016 Chief Complaint:  mdd without psychosis cocaine use disorder Principal Diagnosis: Major depressive disorder, recurrent episode, moderate degree ([place]) Diagnosis:   Patient Active Problem List   Diagnosis Date Noted  . MDD (major depressive disorder), recurrent episode, moderate ([place]) [F33.1] 08/23/2016  . Major depressive disorder, recurrent episode, moderate degree ([place]) [F33.1] 08/20/2016  . MDD (major depressive disorder), recurrent severe, without psychosis ([place]) [F33.2] 08/08/2015  . Cocaine use disorder, severe, dependence ([place]) [F14.20] 08/08/2015  . Cigar smoker motivated to quit [F17.290] 07/06/2014  . Substance abuse [F19.10] 07/06/2014  . Pregnancy [Z33.1] 06/05/2014   History of Present Illness: 40 year old female with past l history of chronic polysubstance abuse and depression who presents with depressed mood and suicide ideation. Patient states that her significant other was deported 3 weeks ago. Since then she has been inside her house,using crack and cannabis daily She has had worsening thoughts of depression, suicidal ideation and with plans to  Overdose to overdose with the pills in her home. She has a history of suicidal attempts and has been previously hospitalized. She states she has little to no hope, as well as no support as she was born and raised in [place] and family is there. Associated Signs/Symptoms: Depression Symptoms:  depressed mood, anhedonia, insomnia, psychomotor agitation, fatigue, feelings of worthlessness/guilt, difficulty concentrating, hopelessness, impaired memory, recurrent thoughts of death, anxiety, weight loss, decreased appetite, (Hypo) Manic Symptoms:  None Anxiety Symptoms:  Excessive Worry, Psychotic Symptoms:  none PTSD Symptoms: NA Total Time spent with patient: 1 hour  Past Psychiatric History:  Hospitalized in [place] and here last year  Is the patient at risk to self? Yes.    Has the patient been a risk to self in the past 6 months? Yes.    Has the patient been a risk to self within the distant past? Yes.    Is the patient a risk to others? No.  Has the patient been a risk to others in the past 6 months? No.  Has the patient been a risk to others within the distant past? No.   Prior Inpatient Therapy:   Prior Outpatient Therapy:    Alcohol Screening: 1. How often do you have a drink containing alcohol?: Never 9. Have you or someone else been injured as a result of your drinking?: No 10. Has a relative or friend or a doctor or another health worker been concerned about your drinking or suggested you cut down?: No Alcohol Use Disorder Identification Test Final Score (AUDIT): 0 Brief Intervention: AUDIT score less than 7 or less-screening does not suggest unhealthy drinking-brief intervention not indicated Substance Abuse History in the last 12 months:  Yes.   Consequences of Substance Abuse: Legal Consequences:  been to jail many times ondrug charges--last year had citation in [place] for possession og drug paraphenelia Previous Psychotropic Medications: Yes  Psychological Evaluations: No  Past Medical History:  Past Medical History:  Diagnosis Date  . MRSA (methicillin resistant Staphylococcus aureus)    surgery on finger 3 years ago    Past Surgical History:  Procedure Laterality Date  . FINGER SURGERY     Family History:  Family History  Problem Relation Age of Onset  . Diabetes Mother   . Hypertension Mother    Family Psychiatric  History: Maternal Aunt has depression and has made suicide attempts. Tobacco Screening: Have you used any form of tobacco in the last 30  days? (Cigarettes, Smokeless Tobacco, Cigars, and/or Pipes): Yes Tobacco use, Select all that apply: 5 or more cigarettes per day Are you interested in Tobacco Cessation Medications?: Yes, will  notify MD for an order Counseled patient on smoking cessation including recognizing danger situations, developing coping skills and basic information about quitting provided: Refused/Declined practical counseling Social History:  History  Alcohol Use No     History  Drug Use No    Comment: 30 days sober, just [facility] 8/11    Additional Social History:      Pain Medications: SEE MAR Prescriptions: SEE MAR Over the Counter: SEE MAR History of alcohol / drug use?: Yes Longest period of sobriety (when/how long): does not recall  Negative Consequences of Use: Financial, Personal relationships Withdrawal Symptoms: Agitation, Irritability, Nausea / Vomiting, Sweats Name of Substance 1: Cocaine  1 - Age of First Use: 40 yrs old  1 - Amount (size/oz): "I don't know" 1 - Frequency: "every other day" 1 - Duration: on-going since the age of 55 1 - Last Use / Amount: "couple of days ago"                  Allergies:  No Known Allergies Lab Results: No results found for this or any previous visit (from the past 23 hour(s)).  Blood Alcohol level:  Lab Results  Component Value Date   ETH <5 08/20/2016   ETH <5 [id number]    Metabolic Disorder Labs:  No results found for: HGBA1C, MPG No results found for: PROLACTIN No results found for: CHOL, TRIG, HDL, CHOLHDL, VLDL, LDLCALC  Current Medications: Current Facility-Administered Medications  Medication Dose Route Frequency Provider Last Rate Last Dose  . acetaminophen (TYLENOL) tablet 650 mg  650 mg Oral Q6H PRN Patrecia Pour, NP   650 mg at 08/23/16 2036  . alum & mag hydroxide-simeth (MAALOX/MYLANTA) 200-200-20 MG/5ML suspension 30 mL  30 mL Oral Q4H PRN Patrecia Pour, NP      . FLUoxetine (PROZAC) capsule 20 mg  20 mg Oral Daily Niel Hummer, NP   20 mg at 08/24/16 0746  . gabapentin (NEURONTIN) capsule 100 mg  100 mg Oral TID Niel Hummer, NP   100 mg at 08/24/16 0746  . hydrOXYzine  (ATARAX/VISTARIL) tablet 25 mg  25 mg Oral Q6H Patrecia Pour, NP   25 mg at 08/24/16 0746  . magnesium hydroxide (MILK OF MAGNESIA) suspension 30 mL  30 mL Oral Daily PRN Patrecia Pour, NP      . nicotine (NICODERM CQ - dosed in mg/24 hours) patch 21 mg  21 mg Transdermal Daily Serena Colonel, RN   21 mg at 08/24/16 0747  . traZODone (DESYREL) tablet 100 mg  100 mg Oral QHS Niel Hummer, NP   100 mg at 08/23/16 2103   PTA Medications: Prescriptions Prior to Admission  Medication Sig Dispense Refill Last Dose  . FLUoxetine (PROZAC) 20 MG capsule Take 1 capsule (20 mg total) by mouth daily. (Patient not taking: Reported on 08/20/2016) 30 capsule 0 Not Taking at Unknown time  . hydrOXYzine (ATARAX/VISTARIL) 25 MG tablet Take 1 tablet (25 mg total) by mouth every 6 (six) hours. (Patient not taking: Reported on 07/15/2015) 12 tablet 0 Not Taking at Unknown time  . traZODone (DESYREL) 50 MG tablet Take 1 tablet (50 mg total) by mouth at bedtime. (Patient not taking: Reported on 08/20/2016) 30 tablet 0 Not Taking at Unknown time    Musculoskeletal:  Strength & Muscle Tone: within normal limits Gait & Station: normal Patient leans: N/A  Psychiatric Specialty Exam: Physical Exam  ROS  Blood pressure 103/73, pulse 89, temperature 98.7 F (37.1 C), temperature source Oral, resp. rate 18, height 5\' 2"  (1.575 m), weight 50.3 kg (110 lb 12.8 oz), last menstrual period 08/17/2016, SpO2 100 %.Body mass index is 20.27 kg/m.  General Appearance: Casual  Eye Contact:  Good  Speech:  Clear and Coherent  Volume:  Normal  Mood:  Depressed  Affect:  Depressed  Thought Process:  Coherent  Orientation:  Full (Time, Place, and Person)  Thought Content:  suicidal thoughts  Suicidal Thoughts:  Yes.  without intent/plan  Homicidal Thoughts:  No  Memory:  Immediate;   Fair Recent;   Fair Remote;   Fair  Judgement:  Poor  Insight:  Fair  Psychomotor Activity:  Normal  Concentration:  Concentration: Fair   Recall:  [employer] of Knowledge:  Fair  Language:  Fair  Akathisia:  No  Handed:  Right  AIMS (if indicated):     Assets:  Desire for Improvement Housing Social Support  ADL's:  Intact  Cognition:  WNL  Sleep:  Number of Hours: 6.25       Treatment Plan Summary: Daily contact with patient to assess and evaluate symptoms and progress in treatment, Medication management and Plan to have patient participate in milieu, individual and group therapy  Observation Level/Precautions:  Continuous Observation  Laboratory:  HCG  Psychotherapy:    Medications:    Consultations:    Discharge Concerns:    Estimated LOS:  Other:     I certify that inpatient services furnished can reasonably be expected to improve the patient's condition.    Ruffin Frederick, MD 8/28/20179:10 AM

## 2016-08-24 NOTE — BHH Suicide Risk Assessment (Signed)
[facility] Admission Suicide Risk Assessment   Nursing information obtained from:    Demographic factors:    Current Mental Status:    Loss Factors:    Historical Factors:    Risk Reduction Factors:     Total Time spent with patient: 1 hour Principal Problem: Major depressive disorder, recurrent episode, moderate degree ([place]) Diagnosis:   Patient Active Problem List   Diagnosis Date Noted  . MDD (major depressive disorder), recurrent episode, moderate ([place]) [F33.1] 08/23/2016  . Major depressive disorder, recurrent episode, moderate degree ([place]) [F33.1] 08/20/2016  . MDD (major depressive disorder), recurrent severe, without psychosis ([place]) [F33.2] 08/08/2015  . Cocaine use disorder, severe, dependence ([place]) [F14.20] 08/08/2015  . Cigar smoker motivated to quit [F17.290] 07/06/2014  . Substance abuse [F19.10] 07/06/2014  . Pregnancy [Z33.1] 06/05/2014   Subjective Data :40 year old female with past l history of chronic polysubstance abuse and depression who presents with depressed mood and suicide ideation. Patient states that her significant other was deported 3 weeks ago. Since then she has been inside her house,using crack and cannabis daily She has had worsening thoughts of depression, suicidal ideation and with plans to  Overdose to overdose with the pills in her home. She has a history of suicidal attempts and has been previously hospitalized. She states she has little to no hope, as well as no support as she was born and raised in [place] and family is there. Associated Signs/Symptoms:: See Psychiatric evaluation  Continued Clinical Symptoms:  Alcohol Use Disorder Identification Test Final Score (AUDIT): 0 The "Alcohol Use Disorders Identification Test", Guidelines for Use in Primary Care, Second Edition.  World Pharmacologist [facility]). Score between 0-7:  no or low risk or alcohol related problems. Score between 8-15:  moderate risk of alcohol related problems. Score between 16-19:   high risk of alcohol related problems. Score 20 or above:  warrants further diagnostic evaluation for alcohol dependence and treatment.   CLINICAL FACTORS:   Depression:   Anhedonia Hopelessness Impulsivity Insomnia   Musculoskeletal: Strength & Muscle Tone: within normal limits Gait & Station: normal Patient leans: N/A  Psychiatric Specialty Exam: Physical Exam  ROS  Blood pressure 103/73, pulse 89, temperature 98.7 F (37.1 C), temperature source Oral, resp. rate 18, height 5\' 2"  (1.575 m), weight 50.3 kg (110 lb 12.8 oz), last menstrual period 08/17/2016, SpO2 100 %.Body mass index is 20.27 kg/m.  See Admission Assessment                                                  Sleep:  Number of Hours: 6.25      COGNITIVE FEATURES THAT CONTRIBUTE TO RISK:  None    SUICIDE RISK:   Moderate:  Frequent suicidal ideation with limited intensity, and duration, some specificity in terms of plans, no associated intent, good self-control, limited dysphoria/symptomatology, some risk factors present, and identifiable protective factors, including available and accessible social support.   PLAN OF CARE:Daily contact with patient to assess and evaluate symptoms and progress in treatment, Medication management and Plan to have patient participate in milieu, individual and group therapy Admit for crisis management and mood stabilization to the [place] Unit.   Counseling provided, health education to help patient understand why observation status to ensure safety Medication management to re-stabilize current mood symptoms. Increase Prozac to 20 mg daily for depressive symptoms, Hydroxyzine  25 mg Q6H prn anixety Individual counseling sessions for coping skills Medical consults as needed Review and reinstate any pertinent home medications for other health problems Possible discharge from [facility] Unit tomorrow with appropriate resources.  I certify that  inpatient services furnished can reasonably be expected to improve the patient's condition.  Ruffin Frederick, MD 08/24/2016, 12:05 PM

## 2016-08-24 NOTE — Tx Team (Signed)
Interdisciplinary Treatment and Diagnostic Plan Update  08/24/2016 Time of Session: 9:30 AM Deborah Melendez MRN: [id number]  Principal Diagnosis: Major depressive disorder, recurrent episode, moderate degree (HCC)  Secondary Diagnoses: Principal Problem:   Major depressive disorder, recurrent episode, moderate degree (HCC) Active Problems:   Cocaine use disorder, severe, dependence (HCC)   MDD (major depressive disorder), recurrent episode, moderate (HCC)   Current Medications:  Current Facility-Administered Medications  Medication Dose Route Frequency Provider Last Rate Last Dose  . acetaminophen (TYLENOL) tablet 650 mg  650 mg Oral Q6H PRN Patrecia Pour, NP   650 mg at 08/23/16 2036  . alum & mag hydroxide-simeth (MAALOX/MYLANTA) 200-200-20 MG/5ML suspension 30 mL  30 mL Oral Q4H PRN Patrecia Pour, NP      . FLUoxetine (PROZAC) capsule 20 mg  20 mg Oral Daily Niel Hummer, NP   20 mg at 08/24/16 0746  . gabapentin (NEURONTIN) capsule 100 mg  100 mg Oral TID Niel Hummer, NP   100 mg at 08/24/16 0746  . hydrOXYzine (ATARAX/VISTARIL) tablet 25 mg  25 mg Oral Q6H Patrecia Pour, NP   25 mg at 08/24/16 0746  . magnesium hydroxide (MILK OF MAGNESIA) suspension 30 mL  30 mL Oral Daily PRN Patrecia Pour, NP      . nicotine (NICODERM CQ - dosed in mg/24 hours) patch 21 mg  21 mg Transdermal Daily Serena Colonel, RN   21 mg at 08/24/16 0747  . traZODone (DESYREL) tablet 100 mg  100 mg Oral QHS Niel Hummer, NP   100 mg at 08/23/16 2103   PTA Medications: Prescriptions Prior to Admission  Medication Sig Dispense Refill Last Dose  . FLUoxetine (PROZAC) 20 MG capsule Take 1 capsule (20 mg total) by mouth daily. (Patient not taking: Reported on 08/20/2016) 30 capsule 0 Not Taking at Unknown time  . hydrOXYzine (ATARAX/VISTARIL) 25 MG tablet Take 1 tablet (25 mg total) by mouth every 6 (six) hours. (Patient not taking: Reported on 07/15/2015) 12 tablet 0 Not Taking at Unknown time  . traZODone  (DESYREL) 50 MG tablet Take 1 tablet (50 mg total) by mouth at bedtime. (Patient not taking: Reported on 08/20/2016) 30 tablet 0 Not Taking at Unknown time    Treatment Modalities: Medication Management, Group therapy, Case management,  1 to 1 session with clinician, Psychoeducation, Recreational therapy.   Physician Treatment Plan for Primary Diagnosis: Major depressive disorder, recurrent episode, moderate degree (HCC) Long Term Goal(s): Improvement in symptoms so as ready for discharge   Short Term Goals: Ability to disclose and discuss suicidal ideas and Compliance with prescribed medications will improve  Medication Management: Evaluate patient's response, side effects, and tolerance of medication regimen.  Therapeutic Interventions: 1 to 1 sessions, Unit Group sessions and Medication administration.  Evaluation of Outcomes: Not Met  Physician Treatment Plan for Secondary Diagnosis: Principal Problem:   Major depressive disorder, recurrent episode, moderate degree (HCC) Active Problems:   Cocaine use disorder, severe, dependence (HCC)   MDD (major depressive disorder), recurrent episode, moderate ([place])  Long Term Goal(s): Improvement in symptoms so as ready for discharge  Short Term Goals: Ability to disclose and discuss suicidal ideas and Compliance with prescribed medications will improve  Medication Management: Evaluate patient's response, side effects, and tolerance of medication regimen.  Therapeutic Interventions: 1 to 1 sessions, Unit Group sessions and Medication administration.  Evaluation of Outcomes: Not Met   RN Treatment Plan for Primary Diagnosis: Major depressive disorder, recurrent episode, moderate degree (  [place]) Long Term Goal(s): Knowledge of disease and therapeutic regimen to maintain health will improve  Short Term Goals: Ability to remain free from injury will improve and Ability to verbalize feelings will improve  Medication Management: RN will  administer medications as ordered by provider, will assess and evaluate patient's response and provide education to patient for prescribed medication. RN will report any adverse and/or side effects to prescribing provider.  Therapeutic Interventions: 1 on 1 counseling sessions, Psychoeducation, Medication administration, Evaluate responses to treatment, Monitor vital signs and CBGs as ordered, Perform/monitor CIWA, COWS, AIMS and Fall Risk screenings as ordered, Perform wound care treatments as ordered.  Evaluation of Outcomes: Not Met   LCSW Treatment Plan for Primary Diagnosis: Major depressive disorder, recurrent episode, moderate degree ([place]) Long Term Goal(s): Safe transition to appropriate next level of care at discharge, Engage patient in therapeutic group addressing interpersonal concerns.  Short Term Goals: Engage patient in aftercare planning with referrals and resources, Increase social support, Increase ability to appropriately verbalize feelings and Identify triggers associated with mental health/substance abuse issues  Therapeutic Interventions: Assess for all discharge needs, 1 to 1 time with Social worker, Explore available resources and support systems, Assess for adequacy in community support network, Educate family and significant other(s) on suicide prevention, Complete Psychosocial Assessment, Interpersonal group therapy.  Evaluation of Outcomes: Not Met   Progress in Treatment: Attending groups: No. New to unit Participating in groups: No. Taking medication as prescribed: Yes. Toleration medication: Yes. Family/Significant other contact made: No, will contact:  collateral as appropriate, new to unit Patient understands diagnosis: Yes. Discussing patient identified problems/goals with staff: Yes. Medical problems stabilized or resolved: Yes. Denies suicidal/homicidal ideation: No. and As evidenced by:  Admitted to unit w SI, contracts for safety on unit,environmental  stressors and losses Issues/concerns per patient self-inventory: Yes. Other:    New problem(s) identified: Yes, Describe:  new admit from OBS unit, assessing  New Short Term/Long Term Goal(s):  Discharge Plan or Barriers:   Reason for Continuation of Hospitalization: Depression Medication stabilization Suicidal ideation  Estimated Length of Stay: 3- 5 days  Attendees: Patient: 08/24/2016 8:14 AM  Physician: Julieanne Manson MD 08/24/2016 8:14 AM  Nursing: Gaylan Gerold, RN; Mayra Neer, RN 08/24/2016 8:14 AM  RN Care Manager: 08/24/2016 8:14 AM  Social Worker: Ival Bible, LCSW; Eusebio Me, LCSW 08/24/2016 8:14 AM  Recreational Therapist:  08/24/2016 8:14 AM  Other:  08/24/2016 8:14 AM  Other:  08/24/2016 8:14 AM  Other: 08/24/2016 8:14 AM    Scribe for Treatment Team: Beverely Pace, LCSW 08/24/2016 8:14 AM

## 2016-08-24 NOTE — Progress Notes (Signed)
Recreation Therapy Notes  Date: 08/24/16 Time: 0930 Location: [address] Dayroom  Group Topic: Stress Management  Goal Area(s) Addresses:  Patient will verbalize importance of using healthy stress management.  Patient will identify positive emotions associated with healthy stress management.   Intervention: Stress Management  Activity :  [place].  LRT introduced to the technique of guided imagery to the patients.  Patients were to follow along as LRT read script in order to participate in the the technique.  Education: Stress Management, Discharge Planning.   Education Outcome: Acknowledges edcuation/In group clarification offered/Needs additional education  Clinical Observations/Feedback: Pt did not attend group.    Victorino Sparrow, LRT/CTRS     Victorino Sparrow A 08/24/2016 12:49 PM

## 2016-08-25 NOTE — Progress Notes (Signed)
CSW attempted to meet with Pt again to complete assessment. Pt continues to be sleeping and does not wake for assessment. CSW will attempt to assess again later.  Peri Maris, LCSW Clinical Social Work [phone]

## 2016-08-25 NOTE — Progress Notes (Signed)
Adult Psychoeducational Group Note  Date:  08/25/2016 Time:  9:18 PM  Group Topic/Focus:  Wrap-Up Group:   The focus of this group is to help patients review their daily goal of treatment and discuss progress on daily workbooks.   Participation Level:  Active  Participation Quality:  Appropriate  Affect:  Appropriate  Cognitive:  Alert  Insight: Appropriate  Engagement in Group:  Engaged  Modes of Intervention:  Discussion  Additional Comments:  On a scale between 1-10, (1=worse, 10=best) patient rated her day an 8. Patient states she had a good day overall. Patient's goal for today was to get in touch with [facility]. Patient met her goal.  Lavanda Nevels L Carolyn Sylvia 08/25/2016, 9:18 PM

## 2016-08-25 NOTE — Progress Notes (Signed)
CSW attempted to wake Pt to complete PSA; Pt did not wake up to participate. CSW will attempt at a later time.   Peri Maris, LCSW Clinical Social Work [phone]

## 2016-08-25 NOTE — Progress Notes (Signed)
[facility] MD Progress Note  08/25/2016 11:28 AM Deborah Melendez  MRN:  [id number] Subjective:  Still deoressed and suicidal but no plan and wants to go to [facility] substance abuse services. States she is on their waitlist. Principal Problem: Major depressive disorder, recurrent episode, moderate degree ([place]) Diagnosis:   Patient Active Problem List   Diagnosis Date Noted  . MDD (major depressive disorder), recurrent episode, moderate ([place]) [F33.1] 08/23/2016  . Major depressive disorder, recurrent episode, moderate degree ([place]) [F33.1] 08/20/2016  . MDD (major depressive disorder), recurrent severe, without psychosis ([place]) [F33.2] 08/08/2015  . Cocaine use disorder, severe, dependence ([place]) [F14.20] 08/08/2015  . Cigar smoker motivated to quit [F17.290] 07/06/2014  . Substance abuse [F19.10] 07/06/2014  . Pregnancy [Z33.1] 06/05/2014   Total Time spent with patient: 30 minutes  Past Psychiatric History: Long history of Multiple Substance use and depressive symptoms with multiple legal involvement  Past Medical History:  Past Medical History:  Diagnosis Date  . MRSA (methicillin resistant Staphylococcus aureus)    surgery on finger 3 years ago    Past Surgical History:  Procedure Laterality Date  . FINGER SURGERY     Family History:  Family History  Problem Relation Age of Onset  . Diabetes Mother   . Hypertension Mother    Family Psychiatric  History: Elenor Legato has schizophrenia Social History:  History  Alcohol Use No     History  Drug Use No    Comment: 30 days sober, just completed treatment at [facility] 8/11    Social History   Social History  . Marital status: Single    Spouse name: N/A  . Number of children: N/A  . Years of education: N/A   Social History Main Topics  . Smoking status: Current Every Day Smoker    Packs/day: 0.50    Types: Cigarettes  . Smokeless tobacco: Never Used  . Alcohol use No  . Drug use: No     Comment: 30 days sober, just completed  treatment at [facility] 8/11  . Sexual activity: Yes    Birth control/ protection: None   Other Topics Concern  . None   Social History Narrative  . None   Additional Social History:    Pain Medications: SEE MAR Prescriptions: SEE MAR Over the Counter: SEE MAR History of alcohol / drug use?: Yes Longest period of sobriety (when/how long): does not recall  Negative Consequences of Use: Financial, Personal relationships Withdrawal Symptoms: Agitation, Irritability, Nausea / Vomiting, Sweats Name of Substance 1: Cocaine  1 - Age of First Use: 40 yrs old  1 - Amount (size/oz): "I don't know" 1 - Frequency: "every other day" 1 - Duration: on-going since the age of 82 1 - Last Use / Amount: "couple of days ago"                  Sleep: Fair  Appetite:  Fair  Current Medications: Current Facility-Administered Medications  Medication Dose Route Frequency Provider Last Rate Last Dose  . acetaminophen (TYLENOL) tablet 650 mg  650 mg Oral Q6H PRN Patrecia Pour, NP   650 mg at 08/24/16 1606  . alum & mag hydroxide-simeth (MAALOX/MYLANTA) 200-200-20 MG/5ML suspension 30 mL  30 mL Oral Q4H PRN Patrecia Pour, NP      . feeding supplement (ENSURE ENLIVE) (ENSURE ENLIVE) liquid 237 mL  237 mL Oral BID BM Jenne Campus, MD   237 mL at 08/24/16 1825  . FLUoxetine (PROZAC) capsule 20 mg  20 mg  Oral Daily Niel Hummer, NP   20 mg at 08/25/16 [id number]  . gabapentin (NEURONTIN) capsule 100 mg  100 mg Oral TID Niel Hummer, NP   100 mg at 08/25/16 [id number]  . hydrOXYzine (ATARAX/VISTARIL) tablet 25 mg  25 mg Oral Q6H Patrecia Pour, NP   25 mg at 08/24/16 2112  . magnesium hydroxide (MILK OF MAGNESIA) suspension 30 mL  30 mL Oral Daily PRN Patrecia Pour, NP      . nicotine (NICODERM CQ - dosed in mg/24 hours) patch 21 mg  21 mg Transdermal Daily Serena Colonel, RN   21 mg at 08/25/16 [id number]  . traZODone (DESYREL) tablet 100 mg  100 mg Oral QHS Niel Hummer, NP   100 mg at 08/24/16 2112    Lab  Results:  Results for orders placed or performed during the hospital encounter of 08/20/16 (from the past 48 hour(s))  hCG, serum, qualitative     Status: None   Collection Time: 08/24/16  6:22 PM  Result Value Ref Range   Preg, Serum NEGATIVE NEGATIVE    Comment:        THE SENSITIVITY OF THIS METHODOLOGY IS >10 mIU/mL. Performed at [facility]     Blood Alcohol level:  Lab Results  Component Value Date   [facility] <5 08/20/2016   ETH <5 [id number]    Metabolic Disorder Labs: No results found for: HGBA1C, MPG No results found for: PROLACTIN No results found for: CHOL, TRIG, HDL, CHOLHDL, VLDL, LDLCALC  Physical Findings: AIMS: Facial and Oral Movements Muscles of Facial Expression: None, normal Lips and Perioral Area: None, normal Jaw: None, normal Tongue: None, normal,Extremity Movements Upper (arms, wrists, hands, fingers): None, normal Lower (legs, knees, ankles, toes): None, normal, Trunk Movements Neck, shoulders, hips: None, normal, Overall Severity Severity of abnormal movements (highest score from questions above): None, normal Incapacitation due to abnormal movements: None, normal Patient's awareness of abnormal movements (rate only patient's report): No Awareness, Dental Status Current problems with teeth and/or dentures?: No (missing several teeth in front) Does patient usually wear dentures?: No  CIWA:    COWS:  COWS Total Score: 0  Musculoskeletal: Strength & Muscle Tone: within normal limits Gait & Station: normal Patient leans: N/A  Psychiatric Specialty Exam: Physical Exam  ROS  Blood pressure 102/78, pulse 91, temperature 98.2 F (36.8 C), temperature source Oral, resp. rate 16, height 5\' 2"  (1.575 m), weight 50.3 kg (110 lb 12.8 oz), last menstrual period 08/17/2016, SpO2 100 %.Body mass index is 20.27 kg/m.  General Appearance: Casual  Eye Contact:  Good  Speech:  Normal Rate  Volume:  Normal  Mood:  Anxious and Depressed   Affect:  Depressed  Thought Process:  Coherent and Goal Directed  Orientation:  Full (Time, Place, and Person)  Thought Content:  Logical  Suicidal Thoughts:  Yes.  without intent/plan  Homicidal Thoughts:  No  Memory:  Immediate;   Fair Recent;   Fair Remote;   Fair  Judgement:  Improving  Insight:  Fair  Psychomotor Activity:  Normal  Concentration:  Concentration: Fair and Attention Span: Fair  Recall:  [employer] of Knowledge:  Fair  Language:  Fair  Akathisia:  No  Handed:  Right  AIMS (if indicated):     Assets:  Communication Skills Desire for Improvement Housing Social Support  ADL's:  Intact  Cognition:  WNL  Sleep:  Number of Hours: 6.5     Treatment Plan Summary:  Daily contact with patient to assess and evaluate symptoms and progress in treatment and Medication management. Social Worker to o assist patient in getting to a substance abuse program.   Ruffin Frederick, MD 08/25/2016, 11:28 AM

## 2016-08-25 NOTE — [facility] Group Notes (Signed)
[place] Group Notes:  (Nursing/MHT/Case Management/Adjunct)  Date:  08/25/2016  Time:  0900  Type of Therapy:  Nurse Education  Participation Level:  Did Not Attend  Participation Quality:    Affect:    Cognitive:    Insight:    Engagement in Group:    Modes of Intervention:    Summary of Progress/Problems:  Patient invited to participate however declined and remained in bed.  Loletta Specter [facility] 08/25/2016, 0930

## 2016-08-25 NOTE — B[facility] Group Notes (Signed)
[place] LCSW Group Therapy 08/25/2016 1:15 PM  Type of Therapy: Group Therapy- Feelings about Diagnosis  Participation Level: Active   Participation Quality:  Appropriate  Affect:  Appropriate  Cognitive: Alert and Oriented   Insight:  Developing   Engagement in Therapy: Developing/Improving and Engaged   Modes of Intervention: Clarification, Confrontation, Discussion, Education, Exploration, Limit-setting, Orientation, Problem-solving, Rapport Building, Art therapist, Socialization and Support  Description of Group:   This group will allow patients to explore their thoughts and feelings about diagnoses they have received. Patients will be guided to explore their level of understanding and acceptance of these diagnoses. Facilitator will encourage patients to process their thoughts and feelings about the reactions of others to their diagnosis, and will guide patients in identifying ways to discuss their diagnosis with significant others in their lives. This group will be process-oriented, with patients participating in exploration of their own experiences as well as giving and receiving support and challenge from other group members.  Summary of Progress/Problems:  Pt participated in group discussion and was able to discuss symptoms and stigma.  Therapeutic Modalities:   Cognitive Behavioral Therapy Solution Focused Therapy Motivational Interviewing Relapse Prevention Therapy  Peri Maris, LCSWA 08/25/2016 4:52 PM

## 2016-08-25 NOTE — Progress Notes (Signed)
D: Pt was in the day room upon initial approach.  Her goal is to "be a better [place] than I was yesterday."  She reports she slept well last night.  She describes her mood as "severely depressed."   Pt denies SI/HI, denies hallucinations, denies pain.  Pt has been visible in milieu interacting with peers and staff appropriately.  Pt attended evening group.   A: Met with pt and offered support and encouragement.  Actively listened to pt.  Medications administered per order.   R: Pt is compliant with medications.  Pt verbally contracts for safety.  Will continue to monitor and assess.

## 2016-08-25 NOTE — B[facility] Group Notes (Signed)
Deborah Melendez was invited to group on grief and loss.  Did not attend.

## 2016-08-25 NOTE — Plan of Care (Signed)
Problem: Activity: Goal: Sleeping patterns will improve Outcome: Progressing Pt slept 6.25 hours last night according to flowsheet.    

## 2016-08-25 NOTE — Progress Notes (Signed)
Recreation Therapy Notes  Animal-Assisted Activity (AAA) Program Checklist/Progress Notes Patient Eligibility Criteria Checklist & Daily Group note for Rec TxIntervention  Date: 08.29.2017 Time: 2:45pm Location: [address]   AAA/T Program Assumption of Risk Form signed by Patient/ or Parent Legal Guardian Yes  Patient is free of allergies or sever asthma Yes  Patient reports no fear of animals Yes  Patient reports no history of cruelty to animals Yes  Patient understands his/her participation is voluntary Yes  Patient washes hands before animal contact Yes  Patient washes hands after animal contact Yes  Behavioral Response: Engaged, Attentive, Appropriate   Education:Hand Washing, Appropriate Animal Interaction   Education Outcome: Acknowledges education.   Clinical Observations/Feedback: Patient attended session and interacted appropriately with therapy dog and peers. Patient asked appropriate questions about therapy dog and his training.    Laureen Ochs Yehudit Fulginiti, LRT/CTRS   Yousef Huge L 08/25/2016 3:01 PM

## 2016-08-25 NOTE — Progress Notes (Signed)
D: Patient has been resting in bed, refused to get up for AM meds. Spoke with patient 1:1. Rates sleep poor, appetite good, energy low and concentration poor. Patient's affect flat, eye contact minimal, mood irritable. Rating her depression at a 0/10, hopelessness at a 5/10 and anxiety at a 0/10. States goal for today is to "see how far I am on the waitlist" (at [facility]). Denies pain, physical problems.   A: Medicated per orders, no prns requested, required. Emotional support offered and self inventory reviewed. Encouraged patient to get up, ambulate and attend programming.   R: Patient verbalizes understanding however remains in bed. She denies SI/HI and remains safe on level III obs.

## 2016-08-26 LAB — HIV ANTIBODY (ROUTINE TESTING W REFLEX): HIV Screen 4th Generation wRfx: NONREACTIVE

## 2016-08-26 MED ORDER — AMITRIPTYLINE HCL 25 MG PO TABS
25.0000 mg | ORAL_TABLET | Freq: Every day | ORAL | Status: DC
  Administered 2016-08-26 – 2016-08-27 (×2): 25 mg via ORAL
  Filled 2016-08-26 (×4): qty 1
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
## 2016-08-26 NOTE — BHH Group Notes (Addendum)
[place] LCSW Group Therapy 08/26/2016 1:15 PM  Type of Therapy: Group Therapy- Emotion Regulation  Participation Level: Active   Participation Quality:  Appropriate  Affect: Appropriate  Cognitive: Alert and Oriented   Insight:  Developing/Improving  Engagement in Therapy: Developing/Improving and Engaged   Modes of Intervention: Clarification, Confrontation, Discussion, Education, Exploration, Limit-setting, Orientation, Problem-solving, Rapport Building, Art therapist, Socialization and Support  Summary of Progress/Problems: The topic for group today was emotional regulation. This group focused on both positive and negative emotion identification and allowed group members to process ways to identify feelings, regulate negative emotions, and find healthy ways to manage internal/external emotions. Group members were asked to reflect on a time when their reaction to an emotion led to a negative outcome and explored how alternative responses using emotion regulation would have benefited them. Group members were also asked to discuss a time when emotion regulation was utilized when a negative emotion was experienced. Pt was active in group discussion and identified emotion regulation as an issue for her. She expressed that anger often caused difficulty in her relationships. Pt was inquisitive and receptive to feedback from peers and CSW.  Peri Maris, LCSWA 08/26/2016 4:12 PM

## 2016-08-26 NOTE — Progress Notes (Signed)
Recreation Therapy Notes  Date: 08/26/16 Time: 0930 Location: [address] Dayroom  Group Topic: Stress Management  Goal Area(s) Addresses:  Patient will verbalize importance of using healthy stress management.  Patient will identify positive emotions associated with healthy stress management.   Intervention: Stress Management   Activity :  Progressive Muscle Relaxation.  LRT introduced to technique of progressive muscle relaxation to patients.  LRT read a script to guide the patients through the exercise.  Patients were to follow along as LRT read the script to engage in the activity.  Education:  Stress Management, Discharge Planning.   Education Outcome: Needs additional education  Clinical Observations/Feedback:  Pt did not attend group.    Victorino Sparrow, LRT/CTRS   Victorino Sparrow A 08/26/2016 2:59 PM

## 2016-08-26 NOTE — Progress Notes (Signed)
[facility] MD Progress Note  08/26/2016 2:33 PM Deborah Melendez  MRN:  [id number] Subjective:  Still deoressed and suicidal but no plan and wants to go to [facility] substance abuse services. States she is on their waitlist.Discussed with sw today who will be calling [facility] and [facility] for Rehab Bed. Test result for HIV is non-reactive and patient has been informed and advised to follow up with PCP. Complains of trazodone giving her nightmares and so it will be discontinued Principal Problem: Major depressive disorder, recurrent episode, moderate degree ([place]) Diagnosis:   Patient Active Problem List   Diagnosis Date Noted  . MDD (major depressive disorder), recurrent episode, moderate ([place]) [F33.1] 08/23/2016  . Major depressive disorder, recurrent episode, moderate degree ([place]) [F33.1] 08/20/2016  . MDD (major depressive disorder), recurrent severe, without psychosis ([place]) [F33.2] 08/08/2015  . Cocaine use disorder, severe, dependence ([place]) [F14.20] 08/08/2015  . Cigar smoker motivated to quit [F17.290] 07/06/2014  . Substance abuse [F19.10] 07/06/2014  . Pregnancy [Z33.1] 06/05/2014   Total Time spent with patient: 30 minutes  Past Psychiatric History: Long history of Multiple Substance use and depressive symptoms with multiple legal involvement  Past Medical History:  Past Medical History:  Diagnosis Date  . MRSA (methicillin resistant Staphylococcus aureus)    surgery on finger 3 years ago    Past Surgical History:  Procedure Laterality Date  . FINGER SURGERY     Family History:  Family History  Problem Relation Age of Onset  . Diabetes Mother   . Hypertension Mother    Family Psychiatric  History: Elenor Legato has schizophrenia Social History:  History  Alcohol Use No     History  Drug Use No    Comment: 30 days sober, just completed treatment at [facility] 8/11    Social History   Social History  . Marital status: Single    Spouse name: N/A  . Number of children: N/A  . Years of  education: N/A   Social History Main Topics  . Smoking status: Current Every Day Smoker    Packs/day: 0.50    Types: Cigarettes  . Smokeless tobacco: Never Used  . Alcohol use No  . Drug use: No     Comment: 30 days sober, just completed treatment at [facility] 8/11  . Sexual activity: Yes    Birth control/ protection: None   Other Topics Concern  . None   Social History Narrative  . None   Additional Social History:    Pain Medications: SEE MAR Prescriptions: SEE MAR Over the Counter: SEE MAR History of alcohol / drug use?: Yes Longest period of sobriety (when/how long): does not recall  Negative Consequences of Use: Financial, Personal relationships Withdrawal Symptoms: Agitation, Irritability, Nausea / Vomiting, Sweats Name of Substance 1: Cocaine  1 - Age of First Use: 40 yrs old  1 - Amount (size/oz): "I don't know" 1 - Frequency: "every other day" 1 - Duration: on-going since the age of 38 1 - Last Use / Amount: "couple of days ago"                  Sleep: Fair  Appetite:  Fair  Current Medications: Current Facility-Administered Medications  Medication Dose Route Frequency Provider Last Rate Last Dose  . acetaminophen (TYLENOL) tablet 650 mg  650 mg Oral Q6H PRN Patrecia Pour, NP   650 mg at 08/25/16 1712  . alum & mag hydroxide-simeth (MAALOX/MYLANTA) 200-200-20 MG/5ML suspension 30 mL  30 mL Oral Q4H PRN Patrecia Pour,  NP      . amitriptyline (ELAVIL) tablet 25 mg  25 mg Oral QHS Sueanne Margarita, MD      . feeding supplement (ENSURE ENLIVE) (ENSURE ENLIVE) liquid 237 mL  237 mL Oral BID BM Jenne Campus, MD   237 mL at 08/26/16 0956  . FLUoxetine (PROZAC) capsule 20 mg  20 mg Oral Daily Niel Hummer, NP   20 mg at 08/26/16 0956  . gabapentin (NEURONTIN) capsule 100 mg  100 mg Oral TID Niel Hummer, NP   100 mg at 08/26/16 1200  . hydrOXYzine (ATARAX/VISTARIL) tablet 25 mg  25 mg Oral Q6H Patrecia Pour, NP   25 mg at 08/26/16 0955  . magnesium  hydroxide (MILK OF MAGNESIA) suspension 30 mL  30 mL Oral Daily PRN Patrecia Pour, NP      . nicotine (NICODERM CQ - dosed in mg/24 hours) patch 21 mg  21 mg Transdermal Daily Serena Colonel, RN   21 mg at 08/26/16 [id number]    Lab Results:  Results for orders placed or performed during the hospital encounter of 08/20/16 (from the past 48 hour(s))  hCG, serum, qualitative     Status: None   Collection Time: 08/24/16  6:22 PM  Result Value Ref Range   Preg, Serum NEGATIVE NEGATIVE    Comment:        THE SENSITIVITY OF THIS METHODOLOGY IS >10 mIU/mL. Performed at [facility]   HIV antibody     Status: None   Collection Time: 08/25/16  6:26 PM  Result Value Ref Range   HIV Screen 4th Generation wRfx Non Reactive Non Reactive    Comment: (NOTE) Performed At: [facility] [place], [place] [id number] Lindon Romp MD [id number] Performed at [facility]     Blood Alcohol level:  Lab Results  Component Value Date   [facility] <5 08/20/2016   ETH <5 [id number]    Metabolic Disorder Labs: No results found for: HGBA1C, MPG No results found for: PROLACTIN No results found for: CHOL, TRIG, HDL, CHOLHDL, VLDL, LDLCALC  Physical Findings: AIMS: Facial and Oral Movements Muscles of Facial Expression: None, normal Lips and Perioral Area: None, normal Jaw: None, normal Tongue: None, normal,Extremity Movements Upper (arms, wrists, hands, fingers): None, normal Lower (legs, knees, ankles, toes): None, normal, Trunk Movements Neck, shoulders, hips: None, normal, Overall Severity Severity of abnormal movements (highest score from questions above): None, normal Incapacitation due to abnormal movements: None, normal Patient's awareness of abnormal movements (rate only patient's report): No Awareness, Dental Status Current problems with teeth and/or dentures?: No (missing several teeth in front) Does patient usually wear dentures?: No   CIWA:    COWS:  COWS Total Score: 0  Musculoskeletal: Strength & Muscle Tone: within normal limits Gait & Station: normal Patient leans: N/A  Psychiatric Specialty Exam: Physical Exam  ROS  Blood pressure 115/79, pulse 91, temperature 97.9 F (36.6 C), temperature source Oral, resp. rate 17, height 5\' 2"  (1.575 m), weight 50.3 kg (110 lb 12.8 oz), last menstrual period 08/17/2016, SpO2 100 %.Body mass index is 20.27 kg/m.  General Appearance: Casual  Eye Contact:  Good  Speech:  Normal Rate  Volume:  Normal  Mood:  Anxious and Depressed  Affect:  Depressed  Thought Process:  Coherent and Goal Directed  Orientation:  Full (Time, Place, and Person)  Thought Content:  Logical  Suicidal Thoughts:  Yes.  without intent/plan  Homicidal Thoughts:  No  Memory:  Immediate;   Fair Recent;   Fair Remote;   Fair  Judgement:  Improving  Insight:  Fair  Psychomotor Activity:  Normal  Concentration:  Concentration: Fair and Attention Span: Fair  Recall:  [employer] of Knowledge:  Fair  Language:  Fair  Akathisia:  No  Handed:  Right  AIMS (if indicated):     Assets:  Communication Skills Desire for Improvement Housing Social Support  ADL's:  Intact  Cognition:  WNL  Sleep:  Number of Hours: 6     Treatment Plan Summary: Daily contact with patient to assess and evaluate symptoms and progress in treatment and Medication management. Social Worker to o assist patient in getting to a substance abuse program.  D/C Trazodone Start Amitrityline 25mg  at [place], MD 08/26/2016, 2:33 PM

## 2016-08-26 NOTE — Progress Notes (Signed)
D: Pt mood is labile. Pt easily agitated and irritable on approach. Pt stated "I'm in a bad mood, I just woke up, I do not want to talk, just give me my meds". Pt stated that she's here seeking treatment because of her mood. Pt endorses suicidal thoughts with no plan or intent. Pt verbally contracts for safety.  A: Medications reviewed with pt. Medications administered as ordered per MD. Verbal support provided. Pt encouraged to attend groups. 15 minute checks performed for safety.  R: Pt compliant with tx.

## 2016-08-26 NOTE — Progress Notes (Signed)
Pt is currently on the waitlist at [place] at this time.  Peri Maris, LCSW Clinical Social Work [phone]

## 2016-08-26 NOTE — BHH Suicide Risk Assessment (Signed)
[facility] INPATIENT:  Family/Significant Other Suicide Prevention Education  Suicide Prevention Education:  Patient Refusal for Family/Significant Other Suicide Prevention Education: The patient Deborah Melendez has refused to provide written consent for family/significant other to be provided Family/Significant Other Suicide Prevention Education during admission and/or prior to discharge.  Physician notified.  Bo Mcclintock 08/26/2016, 7:58 AM

## 2016-08-26 NOTE — Progress Notes (Signed)
Patient attended wrap-up group, but did not wish to share. 

## 2016-08-27 MED ORDER — FLUOXETINE HCL 20 MG PO CAPS
40.0000 mg | ORAL_CAPSULE | Freq: Every day | ORAL | Status: DC
  Administered 2016-08-28: 40 mg via ORAL
  Filled 2016-08-27 (×3): qty 2

## 2016-08-27 NOTE — Tx Team (Signed)
Interdisciplinary Treatment and Diagnostic Plan Update  08/27/2016 Time of Session: 9:30 AM Deborah Melendez MRN: [id number]  Principal Diagnosis: Major depressive disorder, recurrent episode, moderate degree (HCC)  Secondary Diagnoses: Principal Problem:   Major depressive disorder, recurrent episode, moderate degree (HCC) Active Problems:   Cocaine use disorder, severe, dependence (HCC)   MDD (major depressive disorder), recurrent episode, moderate (HCC)   Current Medications:  Current Facility-Administered Medications  Medication Dose Route Frequency Provider Last Rate Last Dose  . acetaminophen (TYLENOL) tablet 650 mg  650 mg Oral Q6H PRN Patrecia Pour, NP   650 mg at 08/26/16 2229  . alum & mag hydroxide-simeth (MAALOX/MYLANTA) 200-200-20 MG/5ML suspension 30 mL  30 mL Oral Q4H PRN Patrecia Pour, NP      . amitriptyline (ELAVIL) tablet 25 mg  25 mg Oral QHS Sueanne Margarita, MD   25 mg at 08/26/16 2226  . feeding supplement (ENSURE ENLIVE) (ENSURE ENLIVE) liquid 237 mL  237 mL Oral BID BM Myer Peer Cobos, MD   237 mL at 08/27/16 1059  . FLUoxetine (PROZAC) capsule 20 mg  20 mg Oral Daily Niel Hummer, NP   20 mg at 08/27/16 1058  . gabapentin (NEURONTIN) capsule 100 mg  100 mg Oral TID Niel Hummer, NP   100 mg at 08/27/16 1100  . hydrOXYzine (ATARAX/VISTARIL) tablet 25 mg  25 mg Oral Q6H Patrecia Pour, NP   25 mg at 08/27/16 1058  . magnesium hydroxide (MILK OF MAGNESIA) suspension 30 mL  30 mL Oral Daily PRN Patrecia Pour, NP      . nicotine (NICODERM CQ - dosed in mg/24 hours) patch 21 mg  21 mg Transdermal Daily Serena Colonel, RN   21 mg at 08/26/16 [id number]   PTA Medications: Prescriptions Prior to Admission  Medication Sig Dispense Refill Last Dose  . FLUoxetine (PROZAC) 20 MG capsule Take 1 capsule (20 mg total) by mouth daily. (Patient not taking: Reported on 08/20/2016) 30 capsule 0 Not Taking at Unknown time  . hydrOXYzine (ATARAX/VISTARIL) 25 MG tablet Take 1 tablet (25  mg total) by mouth every 6 (six) hours. (Patient not taking: Reported on 07/15/2015) 12 tablet 0 Not Taking at Unknown time  . traZODone (DESYREL) 50 MG tablet Take 1 tablet (50 mg total) by mouth at bedtime. (Patient not taking: Reported on 08/20/2016) 30 tablet 0 Not Taking at Unknown time    Treatment Modalities: Medication Management, Group therapy, Case management,  1 to 1 session with clinician, Psychoeducation, Recreational therapy.   Physician Treatment Plan for Primary Diagnosis: Major depressive disorder, recurrent episode, moderate degree (HCC) Long Term Goal(s): Improvement in symptoms so as ready for discharge   Short Term Goals: Ability to disclose and discuss suicidal ideas and Compliance with prescribed medications will improve  Medication Management: Evaluate patient's response, side effects, and tolerance of medication regimen.  Therapeutic Interventions: 1 to 1 sessions, Unit Group sessions and Medication administration.  Evaluation of Outcomes: Progressing  Physician Treatment Plan for Secondary Diagnosis: Principal Problem:   Major depressive disorder, recurrent episode, moderate degree (HCC) Active Problems:   Cocaine use disorder, severe, dependence (HCC)   MDD (major depressive disorder), recurrent episode, moderate ([place])  Long Term Goal(s): Improvement in symptoms so as ready for discharge  Short Term Goals: Ability to disclose and discuss suicidal ideas and Compliance with prescribed medications will improve  Medication Management: Evaluate patient's response, side effects, and tolerance of medication regimen.  Therapeutic Interventions: 1 to 1 sessions,  Unit Group sessions and Medication administration.  Evaluation of Outcomes: Progressing   RN Treatment Plan for Primary Diagnosis: Major depressive disorder, recurrent episode, moderate degree (HCC) Long Term Goal(s): Knowledge of disease and therapeutic regimen to maintain health will improve  Short  Term Goals: Ability to remain free from injury will improve and Ability to verbalize feelings will improve  Medication Management: RN will administer medications as ordered by provider, will assess and evaluate patient's response and provide education to patient for prescribed medication. RN will report any adverse and/or side effects to prescribing provider.  Therapeutic Interventions: 1 on 1 counseling sessions, Psychoeducation, Medication administration, Evaluate responses to treatment, Monitor vital signs and CBGs as ordered, Perform/monitor CIWA, COWS, AIMS and Fall Risk screenings as ordered, Perform wound care treatments as ordered.  Evaluation of Outcomes: Progressing   LCSW Treatment Plan for Primary Diagnosis: Major depressive disorder, recurrent episode, moderate degree ([place]) Long Term Goal(s): Safe transition to appropriate next level of care at discharge, Engage patient in therapeutic group addressing interpersonal concerns.  Short Term Goals: Engage patient in aftercare planning with referrals and resources, Increase social support, Increase ability to appropriately verbalize feelings and Identify triggers associated with mental health/substance abuse issues  Therapeutic Interventions: Assess for all discharge needs, 1 to 1 time with Social worker, Explore available resources and support systems, Assess for adequacy in community support network, Educate family and significant other(s) on suicide prevention, Complete Psychosocial Assessment, Interpersonal group therapy.  Evaluation of Outcomes: Progressing   Progress in Treatment: Attending groups: Yes Participating in groups: Yes Taking medication as prescribed: Yes. Toleration medication: Yes. Family/Significant other contact made: No, Pt declines Patient understands diagnosis: Yes. Discussing patient identified problems/goals with staff: Yes. Medical problems stabilized or resolved: Yes. Denies suicidal/homicidal ideation:  No, endorsing passive SI Issues/concerns per patient self-inventory: Yes. Other:    New problem(s) identified: None identified at this time.   New Short Term/Long Term Goal(s):  Discharge Plan or Barriers: Pt is requesting residential substance abuse treatment; is currently on the waitlist at [facility].  Reason for Continuation of Hospitalization: Depression Medication stabilization Suicidal ideation  Estimated Length of Stay: 3- 5 days  Attendees: Patient: 08/27/2016 1:35 PM  Physician: Julieanne Manson MD 08/27/2016 1:35 PM  Nursing: Gaylan Gerold, RN; Darrol Angel, RN 08/27/2016 1:35 PM  RN Care Manager: 08/27/2016 1:35 PM  Social Worker: Ival Bible, LCSW; Eusebio Me, LCSW 08/27/2016 1:35 PM  Recreational Therapist:  08/27/2016 1:35 PM  Other:  08/27/2016 1:35 PM  Other:  08/27/2016 1:35 PM  Other: 08/27/2016 1:35 PM    Scribe for Treatment Team: Bo Mcclintock, LCSW 08/27/2016 1:35 PM

## 2016-08-27 NOTE — Progress Notes (Addendum)
[facility] MD Progress Note  08/27/2016 2:43 PM Deborah Melendez  MRN:  [id number] Subjective:  Still deoressed but not suicidal. She also complains of feeling anxious and is encouraged to let the nurding staff know about her increased anxiety. Per SW patient is on Waitlist for [facility] . sHE IS SLEEPING BETTER ON AMITRIPTYLINE Principal Problem: Major depressive disorder, recurrent episode, moderate degree (HCC) Diagnosis:   Patient Active Problem List   Diagnosis Date Noted  . MDD (major depressive disorder), recurrent episode, moderate ([place]) [F33.1] 08/23/2016  . Major depressive disorder, recurrent episode, moderate degree ([place]) [F33.1] 08/20/2016  . MDD (major depressive disorder), recurrent severe, without psychosis ([place]) [F33.2] 08/08/2015  . Cocaine use disorder, severe, dependence ([place]) [F14.20] 08/08/2015  . Cigar smoker motivated to quit [F17.290] 07/06/2014  . Substance abuse [F19.10] 07/06/2014  . Pregnancy [Z33.1] 06/05/2014   Total Time spent with patient: 30 minutes  Past Psychiatric History: Long history of Multiple Substance use and depressive symptoms with multiple legal involvement  Past Medical History:  Past Medical History:  Diagnosis Date  . MRSA (methicillin resistant Staphylococcus aureus)    surgery on finger 3 years ago    Past Surgical History:  Procedure Laterality Date  . FINGER SURGERY     Family History:  Family History  Problem Relation Age of Onset  . Diabetes Mother   . Hypertension Mother    Family Psychiatric  History: Elenor Legato has schizophrenia Social History:  History  Alcohol Use No     History  Drug Use No    Comment: 30 days sober, just completed treatment at [facility] 8/11    Social History   Social History  . Marital status: Single    Spouse name: N/A  . Number of children: N/A  . Years of education: N/A   Social History Main Topics  . Smoking status: Current Every Day Smoker    Packs/day: 0.50    Types: Cigarettes  . Smokeless  tobacco: Never Used  . Alcohol use No  . Drug use: No     Comment: 30 days sober, just completed treatment at [facility] 8/11  . Sexual activity: Yes    Birth control/ protection: None   Other Topics Concern  . None   Social History Narrative  . None   Additional Social History:    Pain Medications: SEE MAR Prescriptions: SEE MAR Over the Counter: SEE MAR History of alcohol / drug use?: Yes Longest period of sobriety (when/how long): does not recall  Negative Consequences of Use: Financial, Personal relationships Withdrawal Symptoms: Agitation, Irritability, Nausea / Vomiting, Sweats Name of Substance 1: Cocaine  1 - Age of First Use: 40 yrs old  1 - Amount (size/oz): "I don't know" 1 - Frequency: "every other day" 1 - Duration: on-going since the age of 20 1 - Last Use / Amount: "couple of days ago"                  Sleep: Fair  Appetite:  Fair  Current Medications: Current Facility-Administered Medications  Medication Dose Route Frequency Provider Last Rate Last Dose  . acetaminophen (TYLENOL) tablet 650 mg  650 mg Oral Q6H PRN Patrecia Pour, NP   650 mg at 08/26/16 2229  . alum & mag hydroxide-simeth (MAALOX/MYLANTA) 200-200-20 MG/5ML suspension 30 mL  30 mL Oral Q4H PRN Patrecia Pour, NP      . amitriptyline (ELAVIL) tablet 25 mg  25 mg Oral QHS Sueanne Margarita, MD   25 mg at  08/26/16 2226  . feeding supplement (ENSURE ENLIVE) (ENSURE ENLIVE) liquid 237 mL  237 mL Oral BID BM Myer Peer Cobos, MD   237 mL at 08/27/16 1059  . [START ON 08/28/2016] FLUoxetine (PROZAC) capsule 40 mg  40 mg Oral Daily Sueanne Margarita, MD      . gabapentin (NEURONTIN) capsule 100 mg  100 mg Oral TID Niel Hummer, NP   100 mg at 08/27/16 1100  . hydrOXYzine (ATARAX/VISTARIL) tablet 25 mg  25 mg Oral Q6H Patrecia Pour, NP   25 mg at 08/27/16 1058  . magnesium hydroxide (MILK OF MAGNESIA) suspension 30 mL  30 mL Oral Daily PRN Patrecia Pour, NP      . nicotine (NICODERM CQ - dosed in  mg/24 hours) patch 21 mg  21 mg Transdermal Daily Serena Colonel, RN   21 mg at 08/26/16 [id number]    Lab Results:  Results for orders placed or performed during the hospital encounter of 08/20/16 (from the past 48 hour(s))  HIV antibody     Status: None   Collection Time: 08/25/16  6:26 PM  Result Value Ref Range   HIV Screen 4th Generation wRfx Non Reactive Non Reactive    Comment: (NOTE) Performed At: [facility] [place], [place] [id number] Lindon Romp MD [id number] Performed at [facility]     Blood Alcohol level:  Lab Results  Component Value Date   [facility] <5 08/20/2016   ETH <5 [id number]    Metabolic Disorder Labs: No results found for: HGBA1C, MPG No results found for: PROLACTIN No results found for: CHOL, TRIG, HDL, CHOLHDL, VLDL, LDLCALC  Physical Findings: AIMS: Facial and Oral Movements Muscles of Facial Expression: None, normal Lips and Perioral Area: None, normal Jaw: None, normal Tongue: None, normal,Extremity Movements Upper (arms, wrists, hands, fingers): None, normal Lower (legs, knees, ankles, toes): None, normal, Trunk Movements Neck, shoulders, hips: None, normal, Overall Severity Severity of abnormal movements (highest score from questions above): None, normal Incapacitation due to abnormal movements: None, normal Patient's awareness of abnormal movements (rate only patient's report): No Awareness, Dental Status Current problems with teeth and/or dentures?: No (missing several teeth in front) Does patient usually wear dentures?: No  CIWA:    COWS:  COWS Total Score: 0  Musculoskeletal: Strength & Muscle Tone: within normal limits Gait & Station: normal Patient leans: N/A  Psychiatric Specialty Exam: Physical Exam  ROS  Blood pressure 108/82, pulse 94, temperature 97.7 F (36.5 C), temperature source Oral, resp. rate 18, height 5\' 2"  (1.575 m), weight 50.3 kg (110 lb 12.8 oz), last menstrual period  08/17/2016, SpO2 100 %.Body mass index is 20.27 kg/m.  General Appearance: Casual  Eye Contact:  Good  Speech:  Normal Rate  Volume:  Normal  Mood:  Anxious and Depressed  Affect:  Depressed and tearful  Thought Process:  Coherent and Goal Directed  Orientation:  Full (Time, Place, and Person)  Thought Content:  Logical  Suicidal Thoughts:  None  Homicidal Thoughts:  No  Memory:  Immediate;   Fair Recent;   Fair Remote;   Fair  Judgement:  Improving  Insight:  Fair  Psychomotor Activity:  Normal  Concentration:  Concentration: Fair and Attention Span: Fair  Recall:  [employer] of Knowledge:  Fair  Language:  Fair  Akathisia:  No  Handed:  Right  AIMS (if indicated):     Assets:  Communication Skills Desire for [place]  ADL's:  Intact  Cognition:  WNL  Sleep:  Number of Hours: 5.75     Treatment Plan Summary: Daily contact with patient to assess and evaluate symptoms and progress in treatment and Medication management. Social Worker to o assist patient in getting to a substance abuse program.  D/C Trazodone On wait list for [facility] iNCREASE PROZAC TO 40MG  DAILY Start Amitrityline 25mg  at [facility] Ruffin Frederick, MD 08/27/2016, 2:43 PM

## 2016-08-27 NOTE — [facility] Group Notes (Signed)
[facility] Group Notes:  (Nursing/MHT/Case Management/Adjunct)  Date:  08/27/2016  Time:  9:16 AM  Type of Therapy:  Psychoeducational Skills  Participation Level:  Did Not Attend  Participation Quality:  N/A  Affect:  N/A  Cognitive:  N/A  Insight:  None  Engagement in Group:  None  Modes of Intervention:  Discussion and Education  Summary of Progress/Problems: Patient invited but did not attend group.   Gaylan Gerold E 08/27/2016, 9:16 AM

## 2016-08-27 NOTE — Progress Notes (Signed)
[facility] Group Notes:  (Nursing/MHT/Case Management/Adjunct)  Date:  08/27/2016  Time:  10:14 PM  Type of Therapy:  Psychoeducational Skills  Participation Level:  Active  Participation Quality:  Appropriate  Affect:  Anxious  Cognitive:  Appropriate  Insight:  Appropriate  Engagement in Group:  Developing/Improving  Modes of Intervention:  Education  Summary of Progress/Problems: The patient verbalized that she had a "crappy day" overall and that she is trying to work on being more patient.   Flecia Shutter S 08/27/2016, 10:14 PM

## 2016-08-27 NOTE — Progress Notes (Signed)
D: Pt presents anxious on approach. Pt reports feeling sad, hopeless and alone this morning. Pt reports increased depression and agitation. Pt reports that she had an altercation in the cafeteria during breakfast this morning and lashed out at another pt who upset her. Pt reports suicidal thoughts with no plan or intent. Pt verbally contracts for safety. Pt denies HI. Pt missed morning dose of Neurontin after refusing to wake up on time for morning meds. 8 am meds given late this morning after pt refused to wake up at scheduled times. A: Medications reviewed with pt. Medications administered as ordered per MD. Verbal support provided. Pt encouraged to attend groups. 15 minute checks performed for safety.  R: Pt receptive to tx.

## 2016-08-28 MED ORDER — TRAZODONE HCL 50 MG PO TABS: 50.0000 mg | ORAL_TABLET | Freq: Every day | ORAL | 0 refills | Status: DC

## 2016-08-28 MED ORDER — FLUOXETINE HCL 40 MG PO CAPS: 40.0000 mg | ORAL_CAPSULE | Freq: Every day | ORAL | 0 refills | Status: DC

## 2016-08-28 MED ORDER — AMITRIPTYLINE HCL 25 MG PO TABS: 25.0000 mg | ORAL_TABLET | Freq: Every day | ORAL | 0 refills | Status: DC

## 2016-08-28 MED ORDER — HYDROXYZINE HCL 25 MG PO TABS: 25.0000 mg | ORAL_TABLET | Freq: Four times a day (QID) | ORAL | 0 refills | Status: DC

## 2016-08-28 MED ORDER — NICOTINE 21 MG/24HR TD PT24: 21.0000 mg | MEDICATED_PATCH | Freq: Every day | TRANSDERMAL | 0 refills | Status: DC

## 2016-08-28 MED ORDER — GABAPENTIN 100 MG PO CAPS: 100.0000 mg | ORAL_CAPSULE | Freq: Three times a day (TID) | ORAL | 0 refills | Status: DC

## 2016-08-28 NOTE — Progress Notes (Signed)
  [facility] Adult Case Management Discharge Plan :  Will you be returning to the same living situation after discharge:  Yes,  home  At discharge, do you have transportation home?: Yes,  friend  Do you have the ability to pay for your medications: Yes,  mental health   Release of information consent forms completed and in the chart;  Patient's signature needed at discharge.  Patient to Follow up at: Follow-up Information    [facility] Follow up on 09/03/2016.   Why:  You have an screening for admission appointment on Thursday Sept. 7th at 7:45am. Please take 30 days worth of medications, photo ID, social security card and personal items.  Contact information: [place] [address] [phone]           Next level of care provider has access to [place] and Suicide Prevention discussed: Yes,  with patient   Have you used any form of tobacco in the last 30 days? (Cigarettes, Smokeless Tobacco, Cigars, and/or Pipes): Yes  Has patient been referred to the Quitline?: Patient refused referral  Patient has been referred for addiction treatment: Yes  Wray Kearns MSW, [place]  08/28/2016, 9:08 PM

## 2016-08-28 NOTE — Discharge Summary (Signed)
Physician Discharge Summary Note  Patient:  Deborah Melendez is an 40 y.o., female MRN:  [id number] DOB:  08/08/1976 Patient phone:  [phone] (home)  Patient address:   [address] [place] [address],  Total Time spent with patient: 30 minutes  Date of Admission:  08/20/2016 Date of Discharge: 08/29/2015  Reason for Admission: Worsening symptoms of depression/suicidal ideations.   Principal Problem: Major depressive disorder, recurrent episode, moderate degree ([place])  Discharge Diagnoses: Patient Active Problem List   Diagnosis Date Noted  . MDD (major depressive disorder), recurrent episode, moderate ([place]) [F33.1] 08/23/2016  . Major depressive disorder, recurrent episode, moderate degree ([place]) [F33.1] 08/20/2016  . MDD (major depressive disorder), recurrent severe, without psychosis ([place]) [F33.2] 08/08/2015  . Cocaine use disorder, severe, dependence ([place]) [F14.20] 08/08/2015  . Cigar smoker motivated to quit [F17.290] 07/06/2014  . Substance abuse [F19.10] 07/06/2014  . Pregnancy [Z33.1] 06/05/2014   Musculoskeletal: Strength & Muscle Tone: within normal limits Gait & Station: normal Patient leans: N/A  Psychiatric Specialty Exam: Physical Exam  Constitutional: She appears well-developed.  HENT:  Head: Normocephalic.  Eyes: Pupils are equal, round, and reactive to light.  Neck: Normal range of motion.  Cardiovascular: Normal rate.   Respiratory: Effort normal.  GI: Soft.  Genitourinary:  Genitourinary Comments: Denies any issues in this area.  Musculoskeletal: Normal range of motion.  Neurological: She is alert.  Skin: Skin is warm.  Psychiatric: She has a normal mood and affect. Her speech is normal and behavior is normal. Judgment and thought content normal. Cognition and memory are normal.    Review of Systems  Constitutional: Negative.   HENT: Negative.   Eyes: Negative.   Respiratory: Negative.   Cardiovascular: Negative.   Gastrointestinal:  Negative.   Genitourinary: Negative.   Musculoskeletal: Negative.   Skin: Negative.   Neurological: Negative.   Endo/Heme/Allergies: Negative.   Psychiatric/Behavioral: Positive for depression (Stabilized with medication). Negative for hallucinations, memory loss, substance abuse and suicidal ideas. The patient is not nervous/anxious and does not have insomnia.     Blood pressure 115/84, pulse 97, temperature 98.2 F (36.8 C), temperature source Oral, resp. rate 18, height 5\' 2"  (1.575 m), weight 50.3 kg (110 lb 12.8 oz), last menstrual period 08/17/2016, SpO2 100 %.Body mass index is 20.27 kg/m.  See Md's SRA   Have you used any form of tobacco in the last 30 days? (Cigarettes, Smokeless Tobacco, Cigars, and/or Pipes): Yes  Has this patient used any form of tobacco in the last 30 days? (Cigarettes, Smokeless Tobacco, Cigars, and/or Pipes) Yes, A prescription for an FDA-approved tobacco cessation medication was offered at discharge and the patient refused  Past Medical History:  Past Medical History:  Diagnosis Date  . MRSA (methicillin resistant Staphylococcus aureus)    surgery on finger 3 years ago    Past Surgical History:  Procedure Laterality Date  . FINGER SURGERY     Family History:  Family History  Problem Relation Age of Onset  . Diabetes Mother   . Hypertension Mother    Social History:  History  Alcohol Use No     History  Drug Use No    Comment: 30 days sober, just completed treatment at [facility] 8/11    Social History   Social History  . Marital status: Single    Spouse name: N/A  . Number of children: N/A  . Years of education: N/A   Social History Main Topics  . Smoking status: Current Every Day Smoker  Packs/day: 0.50    Types: Cigarettes  . Smokeless tobacco: Never Used  . Alcohol use No  . Drug use: No     Comment: 30 days sober, just completed treatment at [facility] 8/11  . Sexual activity: Yes    Birth control/ protection: None   Other  Topics Concern  . None   Social History Narrative  . None   Risk to Self: Is patient at risk for suicide?: No What has been your use of drugs/alcohol within the last 12 months?: using daily: crack for 20 years; currently using $400 a month worth Risk to Others: No Prior Inpatient Therapy: Yes Prior Outpatient Therapy: Yes  Level of Care:  OP  Hospital Course:  40 year old female with past l history of chronic polysubstance abuse and depression who presents with depressed mood and suicide ideation. Patient states that her significant other was deported 3 weeks ago. Since then she has been inside her house, using crack and cannabis daily. She has had worsening thoughts of depression, suicidal ideation and with plans to overdose with the pills in her home. She has a history of suicidal attempts and has been previously hospitalized. She states she has little to no hope, as well as no support as she was born and raised in [place] and family is there.   Deborah Melendez was admitted to the [facility] adult 400 unit where she was evaluated and her symptoms were identified. She presented depressed, suicidal & with plans to overdose on pills. She has hx of suicide attempt as well substance abuse issues. She attributed her worsening depression & increased drug use to the deportation of her significant other 3 weeks prior. She was in need of mood stabilization. After evaluation of her presenting symptoms, the medication regimen were discussed & initiated. She was medicated & discharged on; Prozac40 mg daily for depression, Gabapentin 100 mg for agitation, Hydroxyzine 25 mg for anxiety & Trazodone 50 mg for insomnia.  She was encouraged to participate in the unit programming.   During the course of her hospitalization, Deborah Melendez was evaluated daily by a clinical provider to ascertain her response to her treatment regimen.  Improvement was noted by the her report of decreasing symptoms, improved mood, presentation of good  affect, medication tolerance & participation in the unit programming. Deborah Melendez presented no disruptive behaviors on the unit. She worked closely with the treatment team & case manager to develop a discharge plan with appropriate goals to maintain mood stability after discharge. She appeared motivated to abstain from further crack use as she just completed treatment at [facility] not too long ago.   On this day of her hospital discharge,  Deborah Melendez was in much improved condition than upon admission. Her symptoms were reported as significantly decreased or resolved completely. She adamantly denied SI/HI,  AVH, delusional thoughts or paranoia. She was motivated to continue taking medication with a goal of continued improvement in mental health. Deborah Melendez was discharged home with a plan to follow up as noted below. She was provided with a 7 days worth, supply sample of her [facility] discharge medications. She left [facility] in stable condition with all belongings returned to her. Transportation per her arrangement.  Consults:  None and psychiatry  Significant Diagnostic Studies:  Chemistry panel, CBC, TSH, UA, UDS negative   Discharge Vitals:   Blood pressure 115/84, pulse 97, temperature 98.2 F (36.8 C), temperature source Oral, resp. rate 18, height 5\' 2"  (1.575 m), weight 50.3 kg (110 lb 12.8 oz), last menstrual period  08/17/2016, SpO2 100 %. Body mass index is 20.27 kg/m. Lab Results:   Results for orders placed or performed during the hospital encounter of 08/20/16 (from the past 72 hour(s))  HIV antibody     Status: None   Collection Time: 08/25/16  6:26 PM  Result Value Ref Range   HIV Screen 4th Generation wRfx Non Reactive Non Reactive    Comment: (NOTE) Performed At: [facility] [place], [place] [id number] Lindon Romp MD [id number] Performed at [facility]    Physical Findings: AIMS: Facial and Oral Movements Muscles of Facial Expression:  None, normal Lips and Perioral Area: None, normal Jaw: None, normal Tongue: None, normal,Extremity Movements Upper (arms, wrists, hands, fingers): None, normal Lower (legs, knees, ankles, toes): None, normal, Trunk Movements Neck, shoulders, hips: None, normal, Overall Severity Severity of abnormal movements (highest score from questions above): None, normal Incapacitation due to abnormal movements: None, normal Patient's awareness of abnormal movements (rate only patient's report): No Awareness, Dental Status Current problems with teeth and/or dentures?: Yes (missing several front teeth) Does patient usually wear dentures?: No  CIWA:    COWS:  COWS Total Score: 0  See Psychiatric Specialty Exam and Suicide Risk Assessment completed by Attending Physician prior to discharge.  Discharge destination:  Home  Is patient on multiple antipsychotic therapies at discharge:  No    Has Patient had three or more failed trials of antipsychotic monotherapy by history:  No  Recommended Plan for Multiple Antipsychotic Therapies: NA    Medication List    TAKE these medications     Indication  amitriptyline 25 MG tablet Commonly known as:  ELAVIL Take 1 tablet (25 mg total) by mouth at bedtime. For sleep/depression  Indication:  Depression, Trouble Sleeping   FLUoxetine 40 MG capsule Commonly known as:  PROZAC Take 1 capsule (40 mg total) by mouth daily. For depression What changed:  medication strength  how much to take  additional instructions  Indication:  Depression, Major Depressive Disorder   gabapentin 100 MG capsule Commonly known as:  NEURONTIN Take 1 capsule (100 mg total) by mouth 3 (three) times daily. For agitation  Indication:  Agitation   hydrOXYzine 25 MG tablet Commonly known as:  ATARAX/VISTARIL Take 1 tablet (25 mg total) by mouth every 6 (six) hours. For anxiety What changed:  additional instructions  Indication:  Anxiety Neurosis   nicotine 21 mg/24hr  patch Commonly known as:  NICODERM CQ - dosed in mg/24 hours Place 1 patch (21 mg total) onto the skin daily. For smoking cessation  Indication:  Nicotine Addiction   traZODone 50 MG tablet Commonly known as:  DESYREL Take 1 tablet (50 mg total) by mouth at bedtime. For sleep What changed:  additional instructions  Indication:  Trouble Sleeping      Follow-up Information    [facility] Follow up on 09/03/2016.   Why:  You have an screening for admission appointment on Thursday Sept. 7th at 7:45am. Please take 30 days worth of medications, photo ID, social security card and personal items.  Contact information: [place] [address] [phone]         Follow-up recommendations:    Comments:   Take all your medications as prescribed by your mental healthcare provider.  Report any adverse effects and or reactions from your medicines to your outpatient provider promptly.  Patient is instructed and cautioned to not engage in alcohol and or illegal drug use  while on prescription medicines.  In the event of worsening symptoms, patient is instructed to call the crisis hotline, 911 and or go to the nearest ED for appropriate evaluation and treatment of symptoms.  Follow-up with your primary care provider for your other medical issues, concerns and or health care needs.   Signed: Encarnacion Slates, PMHNP, FNP-BC 08/28/2016, 3:46 PM

## 2016-08-28 NOTE — BHH Suicide Risk Assessment (Signed)
[facility] Discharge Suicide Risk Assessment   Principal Problem: Major depressive disorder, recurrent episode, moderate degree ([place]) Discharge Diagnoses:  Patient Active Problem List   Diagnosis Date Noted  . MDD (major depressive disorder), recurrent episode, moderate ([place]) [F33.1] 08/23/2016  . Major depressive disorder, recurrent episode, moderate degree ([place]) [F33.1] 08/20/2016  . MDD (major depressive disorder), recurrent severe, without psychosis ([place]) [F33.2] 08/08/2015  . Cocaine use disorder, severe, dependence ([place]) [F14.20] 08/08/2015  . Cigar smoker motivated to quit [F17.290] 07/06/2014  . Substance abuse [F19.10] 07/06/2014  . Pregnancy [Z33.1] 06/05/2014    Total Time spent with patient: 30 minutes  Musculoskeletal: Strength & Muscle Tone: within normal limits Gait & Station: normal Patient leans: N/A  Psychiatric Specialty Exam: ROS  Blood pressure 115/84, pulse 97, temperature 98.2 F (36.8 C), temperature source Oral, resp. rate 18, height 5\' 2"  (1.575 m), weight 50.3 kg (110 lb 12.8 oz), last menstrual period 08/17/2016, SpO2 100 %.Body mass index is 20.27 kg/m.  General Appearance: Casual  Eye Contact::  Good  Speech:  Clear and Coherent and Normal Rate409  Volume:  Normal  Mood:  Euthymic  Affect:  Congruent  Thought Process:  Coherent and Goal Directed  Orientation:  Full (Time, Place, and Person)  Thought Content:  Logical  Suicidal Thoughts:  No  Homicidal Thoughts:  No  Memory:  Immediate;   Fair Recent;   Fair Remote;   Fair  Judgement:  Fair  Insight:  Poor to limited with regaeds to her substance abuse prolems and how they impact her mood  Psychomotor Activity:  Normal  Concentration:  Fair  Recall:  [place]: Fair  Akathisia:  No  Handed:  Right  AIMS (if indicated):     Assets:  Chief Executive Officer Physical Health Social Support Transportation  Sleep:  Number of Hours: 5.75  Cognition: WNL   ADL's:  Intact   Mental Status Per Nursing Assessment::   On Admission:     Demographic Factors:  Low socioeconomic status Even though patient says she lives alone she has support with whom shw can state. Loss Factors: Legal issues  Historical Factors: Prior suicide attempts  Risk Reduction Factors:   Sense of responsibility to family, Religious beliefs about death and Positive social support  Continued Clinical Symptoms:  Depression:   Impulsivity  Patient has poor insight into her substance abuse and how it impacts her mood. She is refusing to go to Substance Abuse Treatment because she does not need one  Cognitive Features That Contribute To Risk:  None    Suicide Risk:  Minimal: No identifiable suicidal ideation.  Patients presenting with no risk factors but with morbid ruminations; may be classified as minimal risk based on the severity of the depressive symptoms    Plan Of Care/Follow-up recommendations:  Activity:  Follow up with outpatient appointment and continue current medications.  Ruffin Frederick, MD 08/28/2016, 11:04 AM

## 2016-08-28 NOTE — Progress Notes (Signed)
Data. Patient denies SI/HI/AVH. Patient refused to get up for medications this shift. When notified that the MD was going to be discharging her, patient became verbally abusive, yelling at the nurse and calling her, "Stupid" and "Thoughtless". She also stated,"Don't you see that I am a loose cannon?"  Patient then refused to take her AM medications, or discuss her though processes,  until the nurse spoke with the MD about her discharge. Later on in the shift patient come and apologized to the nurse, stating, "I'm just irritable sometimes". Later in the day the patient was noted squealing and laughing with another patient in a very silly way. Patient also refused to complete her self assessment and her suicide safety plan, for discharge. Action. Emotional support and encouragement offered. Education provided on medication, indications and side effect. Q 15 minute checks done for safety. Response. Safety on the unit maintained through 15 minute checks.  Medications taken as prescribed. Attended groups. Remained calm and appropriate through out shift.  Pt. discharged to lobby to await her ride after telling the nurse, "I can't wait, I have to go now.I don't want my ride to have to wait, after working all day." Patient refused to complete her Suicide Safety Plan.  Belongings sheet reviewed and signed by pt. and all belongings, including scripts and medication samples, sent home. Paperwork reviewed and pt. able to verbalize understanding of education. Pt. in no current distress and ambulatory.

## 2016-08-28 NOTE — Progress Notes (Addendum)
Recreation Therapy Notes  Date:08/28/16 Time:0930 Location: [place]  Group Topic: Stress Management  Goal Area(s) Addresses:  Patient will verbalize importance of using healthy stress management.  Patient will identify positive emotions associated with healthy stress management.   Intervention: Stress Management  Activity: Guided Automotive engineer. LRT introduced the stress management technique guided imagery. LRT read script that guided patients in how to perform the technique. Patients were to follow along as LRT read the script to engage in the technique.  Education:Stress Management, Discharge Planning.   Education Outcome:Needs additional education  Clinical Observations/Feedback:Pt did not attend group.   Victorino Sparrow, LRT/CTRS    Victorino Sparrow A 08/28/2016 12:51 PM

## 2016-08-28 NOTE — Progress Notes (Signed)
   D:  Initially pt made a request for mylanta stating that she had acid reflux. Writer attempted to do an assessment, but pt rushed back to her peers.  Later pt returned and informed the writer that "today wasn't a good day". Stated "I couldn't get along with people. Everything bothered me from my socks, my nicotine patch and my wig." Pt laughed as she explained her day to the Probation officer. Pt had no other questions or concerns.   A:  Support and encouragement was offered. 15 min checks continued for safety.  R: Pt remains safe.

## 2016-09-06 ENCOUNTER — Encounter (HOSPITAL_COMMUNITY): Payer: Self-pay | Admitting: Emergency Medicine

## 2016-09-06 ENCOUNTER — Emergency Department (HOSPITAL_COMMUNITY)
Admission: EM | Admit: 2016-09-06 | Discharge: 2016-09-07 | Payer: Medicaid Other | Attending: Emergency Medicine | Admitting: Emergency Medicine

## 2016-09-06 DIAGNOSIS — F1420 Cocaine dependence, uncomplicated: Secondary | ICD-10-CM | POA: Diagnosis present

## 2016-09-06 DIAGNOSIS — F333 Major depressive disorder, recurrent, severe with psychotic symptoms: Secondary | ICD-10-CM

## 2016-09-06 DIAGNOSIS — R45851 Suicidal ideations: Secondary | ICD-10-CM

## 2016-09-06 DIAGNOSIS — F17210 Nicotine dependence, cigarettes, uncomplicated: Secondary | ICD-10-CM | POA: Insufficient documentation

## 2016-09-06 DIAGNOSIS — F1410 Cocaine abuse, uncomplicated: Secondary | ICD-10-CM | POA: Insufficient documentation

## 2016-09-06 DIAGNOSIS — Z5181 Encounter for therapeutic drug level monitoring: Secondary | ICD-10-CM | POA: Insufficient documentation

## 2016-09-06 LAB — SALICYLATE LEVEL: Salicylate Lvl: <4 mg/dL (ref 2.8–30.0)

## 2016-09-06 LAB — CBC
HEMATOCRIT: 36.1 % (ref 36.0–46.0)
HEMOGLOBIN: 11.9 g/dL — AB (ref 12.0–15.0)
MCH: 29.5 pg (ref 26.0–34.0)
MCHC: 33 g/dL (ref 30.0–36.0)
MCV: 89.4 fL (ref 78.0–100.0)
PLATELETS: 298 10*3/uL (ref 150–400)
RBC: 4.04 MIL/uL (ref 3.87–5.11)
RDW: 16 % — ABNORMAL HIGH (ref 11.5–15.5)
WBC: 6.7 10*3/uL (ref 4.0–10.5)

## 2016-09-06 LAB — ACETAMINOPHEN LEVEL: Acetaminophen (Tylenol), Serum: <10 ug/mL — ABNORMAL LOW (ref 10–30)

## 2016-09-06 LAB — COMPREHENSIVE METABOLIC PANEL
ALBUMIN: 3.9 g/dL (ref 3.5–5.0)
ALT: 14 U/L (ref 14–54)
AST: 20 U/L (ref 15–41)
Alkaline Phosphatase: 55 U/L (ref 38–126)
Anion gap: 7 (ref 5–15)
BUN: 10 mg/dL (ref 6–20)
CHLORIDE: 106 mmol/L (ref 101–111)
CO2: 24 mmol/L (ref 22–32)
CREATININE: 0.83 mg/dL (ref 0.44–1.00)
Calcium: 9 mg/dL (ref 8.9–10.3)
GFR calc Af Amer: >60 mL/min (ref >60)
GLUCOSE: 140 mg/dL — AB (ref 65–99)
Potassium: 3.7 mmol/L (ref 3.5–5.1)
Sodium: 137 mmol/L (ref 135–145)
Total Bilirubin: 0.5 mg/dL (ref 0.3–1.2)
Total Protein: 7.3 g/dL (ref 6.5–8.1)

## 2016-09-06 LAB — ETHANOL

## 2016-09-06 MED ORDER — ZOLPIDEM TARTRATE 5 MG PO TABS: 5.0000 mg | ORAL_TABLET | Freq: Every evening | ORAL | Status: DC | PRN

## 2016-09-06 MED ORDER — IBUPROFEN 200 MG PO TABS: 600.0000 mg | ORAL_TABLET | Freq: Three times a day (TID) | ORAL | Status: DC | PRN

## 2016-09-06 MED ORDER — ACETAMINOPHEN 325 MG PO TABS: 650.0000 mg | ORAL_TABLET | ORAL | Status: DC | PRN

## 2016-09-06 MED ORDER — ALUM & MAG HYDROXIDE-SIMETH 200-200-20 MG/5ML PO SUSP: 30.0000 mL | ORAL | Status: DC | PRN

## 2016-09-06 MED ORDER — NICOTINE 21 MG/24HR TD PT24: 21.0000 mg | MEDICATED_PATCH | Freq: Every day | TRANSDERMAL | Status: DC | PRN

## 2016-09-06 MED ORDER — LORAZEPAM 1 MG PO TABS: 2.0000 mg | ORAL_TABLET | Freq: Once | ORAL | Status: DC

## 2016-09-06 MED ORDER — ONDANSETRON HCL 4 MG PO TABS: 4.0000 mg | ORAL_TABLET | Freq: Three times a day (TID) | ORAL | Status: DC | PRN

## 2016-09-06 NOTE — ED Notes (Signed)
Pt belongings in locker 31: pink under garments, 1 pair of grey shoes, multicolored shirts, navy jogging pants with white stripe,  Purple and black wig with multi colored head band

## 2016-09-06 NOTE — ED Notes (Addendum)
Staff attempted to get blood from the pt, she won't stay still and is very aggregated.

## 2016-09-06 NOTE — BH Assessment (Signed)
This writer attempted to complete an assessment but the patient was unable to cooperate at the time.  TTS spoke with Carilyn Goodpasture RN to discontinue the consult until the patient is able to participate in the assessment and she agreed.      Chesley Noon, MSW, Darlyn Read [facility] Triage Specialist [phone]

## 2016-09-06 NOTE — ED Provider Notes (Addendum)
[facility] DEPT Provider Note   CSN: [id number] Arrival date & time: 09/06/16  1543     History   Chief Complaint Chief Complaint  Patient presents with  . Suicidal  . Knee Pain    HPI Deborah Melendez is a 40 y.o. female.  She presents for evaluation of suicidal ideation and ongoing use of cocaine. She states, "I'm a piece of shit". She also states that she wants to "kill herself and other people." She was [facility] the behavioral [facility] 8 days ago. She states she is not taking the medications which she was prescribed. She also did not follow-up for long-term treatment, on September 03 2016, at [facility], as planned. She states that she is unable to stop using cocaine. She denies other problems currently. There are no other known modifying factors.  HPI  Past Medical History:  Diagnosis Date  . MRSA (methicillin resistant Staphylococcus aureus)    surgery on finger 3 years ago    Patient Active Problem List   Diagnosis Date Noted  . MDD (major depressive disorder), recurrent episode, moderate ([place]) 08/23/2016  . Major depressive disorder, recurrent episode, moderate degree ([place]) 08/20/2016  . MDD (major depressive disorder), recurrent severe, without psychosis ([place]) 08/08/2015  . Cocaine use disorder, severe, dependence ([place]) 08/08/2015  . Cigar smoker motivated to quit 07/06/2014  . Substance abuse 07/06/2014  . Pregnancy 06/05/2014    Past Surgical History:  Procedure Laterality Date  . FINGER SURGERY      OB History    Gravida Para Term Preterm AB Living   7 5       5    SAB TAB Ectopic Multiple Live Births           1       Home Medications    Prior to Admission medications   Medication Sig Start Date End Date Taking? Authorizing Provider  amitriptyline (ELAVIL) 25 MG tablet Take 1 tablet (25 mg total) by mouth at bedtime. For sleep/depression 08/28/16   Encarnacion Slates, NP  FLUoxetine (PROZAC) 40 MG capsule Take 1 capsule (40 mg total) by  mouth daily. For depression 08/28/16   Encarnacion Slates, NP  gabapentin (NEURONTIN) 100 MG capsule Take 1 capsule (100 mg total) by mouth 3 (three) times daily. For agitation 08/28/16   Encarnacion Slates, NP  hydrOXYzine (ATARAX/VISTARIL) 25 MG tablet Take 1 tablet (25 mg total) by mouth every 6 (six) hours. For anxiety 08/28/16   Encarnacion Slates, NP  nicotine (NICODERM CQ - DOSED IN MG/24 HOURS) 21 mg/24hr patch Place 1 patch (21 mg total) onto the skin daily. For smoking cessation 08/28/16   Encarnacion Slates, NP  traZODone (DESYREL) 50 MG tablet Take 1 tablet (50 mg total) by mouth at bedtime. For sleep 08/28/16   Encarnacion Slates, NP    Family History Family History  Problem Relation Age of Onset  . Diabetes Mother   . Hypertension Mother     Social History Social History  Substance Use Topics  . Smoking status: Current Every Day Smoker    Packs/day: 0.50    Types: Cigarettes  . Smokeless tobacco: Never Used  . Alcohol use Yes     Comment: occasional      Allergies   Review of patient's allergies indicates no known allergies.   Review of Systems Review of Systems  All other systems reviewed and are negative.    Physical Exam Updated Vital Signs LMP 08/17/2016 (Exact Date)  Physical Exam  Constitutional: She is oriented to person, place, and time. She appears well-developed and well-nourished.  Agitated, dramatic speech, distracted.  HENT:  Head: Normocephalic and atraumatic.  Eyes: Conjunctivae and EOM are normal. Pupils are equal, round, and reactive to light.  Neck: Normal range of motion and phonation normal. Neck supple.  Cardiovascular: Normal rate.   Pulmonary/Chest: Effort normal. She exhibits no tenderness.  Musculoskeletal: Normal range of motion.  Neurological: She is alert and oriented to person, place, and time. She exhibits normal muscle tone.  No dysarthria or aphasia  Skin: Skin is warm and dry. Rash (Nonspecific excoriations of the face.) noted.  Psychiatric:    Agitated  Nursing note and vitals reviewed.    ED Treatments / Results  Labs (all labs ordered are listed, but only abnormal results are displayed) Labs Reviewed  COMPREHENSIVE METABOLIC PANEL  ETHANOL  SALICYLATE LEVEL  ACETAMINOPHEN LEVEL  CBC  URINE RAPID DRUG SCREEN, HOSP PERFORMED    EKG  EKG Interpretation None       Radiology No results found.  Procedures Procedures (including critical care time)  Medications Ordered in ED Medications - No data to display   Initial Impression / Assessment and Plan / ED Course  I have reviewed the triage vital signs and the nursing notes.  Pertinent labs & imaging results that were available during my care of the patient were reviewed by me and considered in my medical decision making (see chart for details).  Clinical Course  Comment By Time  She is medically cleared for treatment by psychiatry. She may need to be placed in a psychiatric facility. Daleen Bo, MD 09/10 [phone]  She appears to be intoxicated with a stimulant medication Daleen Bo, MD 09/10 [phone]    Medications  acetaminophen (TYLENOL) tablet 650 mg (not administered)  ibuprofen (ADVIL,MOTRIN) tablet 600 mg (not administered)  zolpidem (AMBIEN) tablet 5 mg (not administered)  nicotine (NICODERM CQ - dosed in mg/24 hours) patch 21 mg (not administered)  ondansetron (ZOFRAN) tablet 4 mg (not administered)  alum & mag hydroxide-simeth (MAALOX/MYLANTA) 200-200-20 MG/5ML suspension 30 mL (not administered)    No data found.   TTS consultation     Final Clinical Impressions(s) / ED Diagnoses   Final diagnoses:  Suicidal ideation  Cocaine abuse    Depression with suicidal ideation, and  cocaine use.   Nursing Notes Reviewed/ Care Coordinated, and agree without changes. Applicable Imaging Reviewed.  Interpretation of Laboratory Data incorporated into ED treatment  Plan- as per TTS in conjunction with oncoming provider team  New  Prescriptions New Prescriptions   No medications on file     Daleen Bo, MD 09/06/16 [place], MD 09/06/16 [id number]

## 2016-09-06 NOTE — ED Notes (Signed)
Bed: [facility]PT3 Expected date:  Expected time:  Means of arrival:  Comments: 

## 2016-09-06 NOTE — ED Notes (Signed)
Able to get patient to respond briefly. When she responds she wakes up and says, "they keep on laughing at me, they didn't welcome me to the cook out".

## 2016-09-06 NOTE — ED Notes (Signed)
Pt refusing to allow staff to get VS. Will not wake up. When you attempt to grab arm, she pulls arm back and tucks back under the cover. Place HOB in 90 degree angle and this did not startle patient to move

## 2016-09-06 NOTE — ED Triage Notes (Signed)
Patient c/o suicidal thoughts without plan x 3 months. Patient c/o knee pain.

## 2016-09-06 NOTE — ED Notes (Signed)
Report given to [facility] in [place]

## 2016-09-07 ENCOUNTER — Encounter (HOSPITAL_COMMUNITY): Payer: Self-pay | Admitting: *Deleted

## 2016-09-07 ENCOUNTER — Inpatient Hospital Stay (HOSPITAL_COMMUNITY)
Admission: AD | Admit: 2016-09-07 | Discharge: 2016-09-10 | DRG: 885 | Disposition: A | Discharge status: Home / Self Care | Payer: Federal, State, Local not specified - Other | Source: Intra-hospital | Attending: Psychiatry | Admitting: Psychiatry

## 2016-09-07 DIAGNOSIS — F17210 Nicotine dependence, cigarettes, uncomplicated: Secondary | ICD-10-CM | POA: Diagnosis present

## 2016-09-07 DIAGNOSIS — Z8249 Family history of ischemic heart disease and other diseases of the circulatory system: Secondary | ICD-10-CM | POA: Diagnosis not present

## 2016-09-07 DIAGNOSIS — Z833 Family history of diabetes mellitus: Secondary | ICD-10-CM

## 2016-09-07 DIAGNOSIS — G4700 Insomnia, unspecified: Secondary | ICD-10-CM | POA: Diagnosis present

## 2016-09-07 DIAGNOSIS — F1994 Other psychoactive substance use, unspecified with psychoactive substance-induced mood disorder: Secondary | ICD-10-CM | POA: Diagnosis not present

## 2016-09-07 DIAGNOSIS — Z818 Family history of other mental and behavioral disorders: Secondary | ICD-10-CM | POA: Diagnosis not present

## 2016-09-07 DIAGNOSIS — F411 Generalized anxiety disorder: Secondary | ICD-10-CM | POA: Diagnosis present

## 2016-09-07 DIAGNOSIS — F333 Major depressive disorder, recurrent, severe with psychotic symptoms: Secondary | ICD-10-CM | POA: Diagnosis present

## 2016-09-07 DIAGNOSIS — R45851 Suicidal ideations: Secondary | ICD-10-CM | POA: Diagnosis present

## 2016-09-07 DIAGNOSIS — F1420 Cocaine dependence, uncomplicated: Secondary | ICD-10-CM | POA: Diagnosis present

## 2016-09-07 DIAGNOSIS — F1414 Cocaine abuse with cocaine-induced mood disorder: Secondary | ICD-10-CM | POA: Diagnosis present

## 2016-09-07 DIAGNOSIS — F12159 Cannabis abuse with psychotic disorder, unspecified: Secondary | ICD-10-CM | POA: Diagnosis present

## 2016-09-07 DIAGNOSIS — Z9114 Patient's other noncompliance with medication regimen: Secondary | ICD-10-CM | POA: Diagnosis not present

## 2016-09-07 HISTORY — DX: Other psychoactive substance use, unspecified with psychoactive substance-induced mood disorder: F19.94

## 2016-09-07 HISTORY — DX: Other psychoactive substance use, unspecified with intoxication, unspecified: F19.929

## 2016-09-07 LAB — RAPID URINE DRUG SCREEN, HOSP PERFORMED
AMPHETAMINES: NOT DETECTED
BENZODIAZEPINES: NOT DETECTED
Barbiturates: NOT DETECTED
COCAINE: POSITIVE — AB
OPIATES: NOT DETECTED
Tetrahydrocannabinol: POSITIVE — AB

## 2016-09-07 MED ORDER — ASENAPINE MALEATE 5 MG SL SUBL
5.0000 mg | SUBLINGUAL_TABLET | Freq: Two times a day (BID) | SUBLINGUAL | Status: DC
  Administered 2016-09-07 – 2016-09-08 (×2): 5 mg via SUBLINGUAL
  Filled 2016-09-07 (×5): qty 1

## 2016-09-07 MED ORDER — TRAZODONE HCL 100 MG PO TABS
100.0000 mg | ORAL_TABLET | Freq: Every day | ORAL | Status: DC
  Administered 2016-09-08: 100 mg via ORAL
  Filled 2016-09-07 (×3): qty 1

## 2016-09-07 MED ORDER — GABAPENTIN 100 MG PO CAPS: 100.0000 mg | ORAL_CAPSULE | Freq: Three times a day (TID) | ORAL | Status: DC

## 2016-09-07 MED ORDER — PNEUMOCOCCAL VAC POLYVALENT 25 MCG/0.5ML IJ INJ: 0.5000 mL | INJECTION | INTRAMUSCULAR | Status: DC

## 2016-09-07 MED ORDER — ASENAPINE MALEATE 5 MG SL SUBL
5.0000 mg | SUBLINGUAL_TABLET | Freq: Two times a day (BID) | SUBLINGUAL | Status: DC
  Administered 2016-09-07: 5 mg via SUBLINGUAL
  Filled 2016-09-07: qty 1

## 2016-09-07 MED ORDER — ONDANSETRON HCL 4 MG PO TABS: 4.0000 mg | ORAL_TABLET | Freq: Three times a day (TID) | ORAL | Status: DC | PRN

## 2016-09-07 MED ORDER — TRAZODONE HCL 50 MG PO TABS: 50.0000 mg | ORAL_TABLET | Freq: Every day | ORAL | Status: DC

## 2016-09-07 MED ORDER — ALUM & MAG HYDROXIDE-SIMETH 200-200-20 MG/5ML PO SUSP: 30.0000 mL | ORAL | Status: DC | PRN

## 2016-09-07 MED ORDER — MAGNESIUM HYDROXIDE 400 MG/5ML PO SUSP: 30.0000 mL | Freq: Every day | ORAL | Status: DC | PRN

## 2016-09-07 MED ORDER — IBUPROFEN 600 MG PO TABS: 600.0000 mg | ORAL_TABLET | Freq: Three times a day (TID) | ORAL | Status: DC | PRN

## 2016-09-07 MED ORDER — ACETAMINOPHEN 325 MG PO TABS: 650.0000 mg | ORAL_TABLET | ORAL | Status: DC | PRN

## 2016-09-07 MED ORDER — GABAPENTIN 300 MG PO CAPS
300.0000 mg | ORAL_CAPSULE | Freq: Three times a day (TID) | ORAL | Status: DC
  Administered 2016-09-08 – 2016-09-10 (×6): 300 mg via ORAL
  Filled 2016-09-07 (×6): qty 1
  Filled 2016-09-07: qty 21
  Filled 2016-09-07: qty 1
  Filled 2016-09-07 (×2): qty 21
  Filled 2016-09-07 (×3): qty 1

## 2016-09-07 MED ORDER — NICOTINE 21 MG/24HR TD PT24: 21.0000 mg | MEDICATED_PATCH | Freq: Every day | TRANSDERMAL | Status: DC | PRN

## 2016-09-07 MED ORDER — GABAPENTIN 300 MG PO CAPS
300.0000 mg | ORAL_CAPSULE | Freq: Three times a day (TID) | ORAL | Status: DC
  Administered 2016-09-07 (×2): 300 mg via ORAL
  Filled 2016-09-07 (×2): qty 1

## 2016-09-07 MED ORDER — TRAZODONE HCL 100 MG PO TABS: 100.0000 mg | ORAL_TABLET | Freq: Every day | ORAL | Status: DC

## 2016-09-07 MED ORDER — INFLUENZA VAC SPLIT QUAD 0.5 ML IM SUSY
0.5000 mL | PREFILLED_SYRINGE | INTRAMUSCULAR | Status: DC
Start: 2016-09-08 — End: 2016-09-08
  Filled 2016-09-07: qty 0.5

## 2016-09-07 NOTE — Progress Notes (Signed)
Admission Note:  40 year old female who presents, in no acute distress, for the treatment of SI and Depression. Patient presents as agitated and verbally aggressive during admission.  Patient is refusing to answer admission questions and states "I am mentally ill. That's why I'm here. I need help. Now don't ask me any questions. I want to eat and go to sleep".  Patient verbalizes SI with plan to "overdose on pills". She verbally contracts for safety upon admission.  Patient reports Auditory Hallucinations stating "I hear bad people telling me to do bad stuff".  Patient was recently discharged from [facility] and states "I didn't follow up with [facility]".  Patient verbalizes cocaine use with last use "2-3 days ago".  Patient reports that she lives with her fiance.  While at [facility], patient would like to work on "Getting better".  Skin was assessed and found to be clear of any abnormal marks apart from old scars all over patient's body from eczema. Patient searched and no contraband found, POC and unit policies explained and understanding verbalized. Consents obtained.  Patient had no additional questions or concerns.

## 2016-09-07 NOTE — Tx Team (Signed)
Initial Treatment Plan 09/07/2016 7:43 PM Deborah Melendez [id number]    PATIENT STRESSORS: Substance abuse "Mental Health Issues"   PATIENT STRENGTHS: Average or above average intelligence Motivation for treatment/growth Supportive family/friends   PATIENT IDENTIFIED PROBLEMS: At risk for suicide  Substance Abuse  Depression  "Getting Better"  "Mental illness"             DISCHARGE CRITERIA:  Ability to meet basic life and health needs Improved stabilization in mood, thinking, and/or behavior Motivation to continue treatment in a less acute level of care Need for constant or close observation no longer present Safe-care adequate arrangements made Verbal commitment to aftercare and medication compliance Withdrawal symptoms are absent or subacute and managed without 24-hour nursing intervention  PRELIMINARY DISCHARGE PLAN: Attend 12-step recovery group Outpatient therapy Return to previous living arrangement  PATIENT/FAMILY INVOLVEMENT: This treatment plan has been presented to and reviewed with the patient, Deborah Melendez.  The patient and family have been given the opportunity to ask questions and make suggestions.  Dustin Flock, RN 09/07/2016, 7:43 PM

## 2016-09-07 NOTE — Progress Notes (Addendum)
Pt resting well Respirations even and non labored Pt noted without a medicaid pcp Pt left a list of [place] providers to assist with finding a medicaid pcp for f/u care   Entered in d/c instructions  Please use the resources provided to you in emergency room by case manager to assist you're your choice of doctor for follow up     There are medicaid providers on the [employer] ot assist with follow up care

## 2016-09-07 NOTE — Progress Notes (Signed)
Report given to [facility] nurse. [facility] transport called. Patient is stable at discharge.

## 2016-09-07 NOTE — BH Assessment (Signed)
[facility] Assessment Progress Note    09/07/16: Patient was accepted to [facility].
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
## 2016-09-07 NOTE — Progress Notes (Signed)
Psychoeducational Group Note  Date:  09/07/2016 Time:  2103  Group Topic/Focus:  Wrap-Up Group:   The focus of this group is to help patients review their daily goal of treatment and discuss progress on daily workbooks.   Participation Level: Did Not Attend  Participation Quality:  Not Applicable  Affect:  Not Applicable  Cognitive:  Not Applicable  Insight:  Not Applicable  Engagement in Group: Not Applicable  Additional Comments:  The patient did not attend group.   Archie Balboa S 09/07/2016, 9:03 PM

## 2016-09-08 ENCOUNTER — Encounter (HOSPITAL_COMMUNITY): Payer: Self-pay | Admitting: Psychiatry

## 2016-09-08 DIAGNOSIS — F19929 Other psychoactive substance use, unspecified with intoxication, unspecified: Secondary | ICD-10-CM

## 2016-09-08 DIAGNOSIS — R45851 Suicidal ideations: Secondary | ICD-10-CM

## 2016-09-08 DIAGNOSIS — F1994 Other psychoactive substance use, unspecified with psychoactive substance-induced mood disorder: Secondary | ICD-10-CM

## 2016-09-08 HISTORY — DX: Other psychoactive substance use, unspecified with psychoactive substance-induced mood disorder: F19.94

## 2016-09-08 HISTORY — DX: Other psychoactive substance use, unspecified with intoxication, unspecified: F19.929

## 2016-09-08 MED ORDER — BENZTROPINE MESYLATE 1 MG PO TABS: 1.0000 mg | ORAL_TABLET | Freq: Four times a day (QID) | ORAL | Status: DC | PRN

## 2016-09-08 MED ORDER — LORAZEPAM 2 MG/ML IJ SOLN: 1.0000 mg | INTRAMUSCULAR | Status: DC | PRN

## 2016-09-08 MED ORDER — INFLUENZA VAC SPLIT QUAD 0.5 ML IM SUSY
0.5000 mL | PREFILLED_SYRINGE | INTRAMUSCULAR | Status: AC
  Administered 2016-09-09: 0.5 mL via INTRAMUSCULAR
  Filled 2016-09-08: qty 0.5

## 2016-09-08 MED ORDER — TRAZODONE HCL 100 MG PO TABS
100.0000 mg | ORAL_TABLET | Freq: Every evening | ORAL | Status: DC | PRN
Start: 2016-09-08 — End: 2016-09-10
  Administered 2016-09-08: 100 mg via ORAL
  Filled 2016-09-08: qty 7

## 2016-09-08 MED ORDER — LORAZEPAM 1 MG PO TABS: 1.0000 mg | ORAL_TABLET | ORAL | Status: DC | PRN

## 2016-09-08 MED ORDER — DIVALPROEX SODIUM 250 MG PO DR TAB
250.0000 mg | DELAYED_RELEASE_TABLET | Freq: Three times a day (TID) | ORAL | Status: DC
  Administered 2016-09-08 – 2016-09-10 (×5): 250 mg via ORAL
  Filled 2016-09-08 (×2): qty 21
  Filled 2016-09-08 (×4): qty 1
  Filled 2016-09-08: qty 21
  Filled 2016-09-08 (×5): qty 1

## 2016-09-08 MED ORDER — PNEUMOCOCCAL VAC POLYVALENT 25 MCG/0.5ML IJ INJ
0.5000 mL | INJECTION | INTRAMUSCULAR | Status: AC
  Administered 2016-09-09: 0.5 mL via INTRAMUSCULAR

## 2016-09-08 MED ORDER — QUETIAPINE FUMARATE 50 MG PO TABS
50.0000 mg | ORAL_TABLET | Freq: Every day | ORAL | Status: DC
  Administered 2016-09-08: 50 mg via ORAL
  Filled 2016-09-08 (×3): qty 1

## 2016-09-08 MED ORDER — HALOPERIDOL 5 MG PO TABS: 5.0000 mg | ORAL_TABLET | Freq: Four times a day (QID) | ORAL | Status: DC | PRN

## 2016-09-08 NOTE — Plan of Care (Signed)
Problem: Coping: Goal: Ability to verbalize frustrations and anger appropriately will improve Outcome: Progressing Pt stated she was having issues dealing with the [facility], pt irritable on the unit and keeps to self

## 2016-09-08 NOTE — Progress Notes (Signed)
D: Pt at the time of assessment endorses moderates depression and anxiety with some command auditory hallucinations; states, "my day has been very bad; can't get this voice off my head; they want me to hurt myself." Pt contracts for safety. Pt is flat and isolated to room; remained in the all evening. Pt however denied pain or HI.  A: Medications offered as prescribed.  Support, encouragement, and safe environment provided.  15-minute safety checks continue. R: Pt was eventually compliant with 2200 medications.  Pt did notattend wrap-up group. Safety checks continue.
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
## 2016-09-08 NOTE — Progress Notes (Signed)
DAR NOTE: Patient presents with irritable mood and affect.  Reports hearing voices telling her to kill herself but contracts for safety.  Denies pain, auditory and visual hallucinations.  Maintained on routine safety checks.  Refused afternoon medications after several attempt and encouragement.  Support and encouragement offered as needed.  Patient remained in bed sleeping most of this shift.  Patient only came out of her room for meals.  Patient became verbally abusive when encourage to participate and be visible in the milieu.

## 2016-09-08 NOTE — H&P (Addendum)
Psychiatric Admission Assessment Adult  Patient Identification: Deborah Melendez MRN:  [id number] Date of Evaluation:  09/08/2016 Chief Complaint: Patient states " I am depressed and I am hearing voices asking me to kill myself.'   Principal Diagnosis: Substance or medication-induced bipolar and related disorder with onset during intoxication (HCC)(cocaine, cannabis)  Diagnosis:   Patient Active Problem List   Diagnosis Date Noted  . Substance or medication-induced bipolar and related disorder with onset during intoxication ([place]) [F19.94] 09/08/2016  . Major depressive disorder, recurrent episode, moderate degree ([place]) [F33.1] 08/20/2016  . MDD (major depressive disorder), recurrent severe, without psychosis ([place]) [F33.2] 08/08/2015  . Cocaine use disorder, severe, dependence ([place]) [F14.20] 08/08/2015  . Cigar smoker motivated to quit [F17.290] 07/06/2014   History of Present Illness: Patient is a 40 year old caucasian  female , who has a hx of mood sx as well as substance abuse ( cocaine, cannabis) , who who presented to [facility] ,with psychosis and SI with plan to OD.   Per initial notes in EHR : " Patient presents as agitated and verbally aggressive during admission. Patient verbalizes SI with plan to "overdose on pills". She verbally contracts for safety upon admission.  Patient reports Auditory Hallucinations stating "I hear bad people telling me to do bad stuff".  Patient was recently discharged from [facility] and states "I didn't follow up with [facility]".  Patient verbalizes cocaine use with last use "2-3 days ago".    Patient seen and chart reviewed TODAY.Discussed patient with treatment team. Patient today is seen as labile , irritable , anxious and paranoid. Pt seen as withdrawn to self, avoids eye contact and continues to endorse AH with command voices asking her to kill self or others. Pt reports also has mood swings - periods when she is manic , has high energy , sleep issues and talks a lot.  Pt reports she had 1 episode couple of weeks ago. Pt at this times reports feeling depressed and anxious . Pt also endorsed SI with plan as noted above.  Pt reports panic sx - when she is upset"- reports SOB, anxiety sx, racing heart rate and chest pain.  Pt reports abusing cocaine - past 20 yrs - 30$ per day , cannabis daily - unable to elaborate how much.  Pt reports she is motivated to get help with her substance abuse issues - CSW to help with the same.  Pt reports being on Prozac and elavil - during last admission - but states she stopped it since they were not working.  Pt does not follow up with an out patient provider , has had several admissions in the past - including most recently at [facility] - few weeks ago. Pt reports suicide attempts in the past x3.     Associated Signs/Symptoms: Depression Symptoms:  depressed mood, anhedonia, insomnia, psychomotor agitation, recurrent thoughts of death, anxiety, weight loss, decreased appetite, (Hypo) Manic Symptoms: mood swings , elevated energy , sleep issues , talking lot and being impuslive  Anxiety Symptoms:  Excessive Worry, Psychotic Symptoms:  see above PTSD Symptoms: NA Total Time spent with patient: 1 hour  Past Psychiatric History: see above H&p.  Is the patient at risk to self? Yes.    Has the patient been a risk to self in the past 6 months? Yes.    Has the patient been a risk to self within the distant past? Yes.    Is the patient a risk to others? Yes.    Has the patient been  a risk to others in the past 6 months? No.  Has the patient been a risk to others within the distant past? No.   Prior Inpatient Therapy:  see above Prior Outpatient Therapy:  see above  Alcohol Screening: 1. How often do you have a drink containing alcohol?: Never 9. Have you or someone else been injured as a result of your drinking?: No 10. Has a relative or friend or a doctor or another health worker been concerned about your drinking or  suggested you cut down?: No Alcohol Use Disorder Identification Test Final Score (AUDIT): 0 Brief Intervention: AUDIT score less than 7 or less-screening does not suggest unhealthy drinking-brief intervention not indicated Substance Abuse History in the last 12 months:  Yes.  cocaine , cannabis - see above  Consequences of Substance Abuse: Legal Consequences:  been to jail many times ondrug charges--last year had citation in [place] for possession og drug paraphenelia- ( as per EHR) Previous Psychotropic Medications: Yes PROZAC, ELAVIL ( NONCOMPLIANT SINCE STOPPED WORKING) Psychological Evaluations: No  Past Medical History:  Past Medical History:  Diagnosis Date  . MRSA (methicillin resistant Staphylococcus aureus)    surgery on finger 3 years ago  . Substance or medication-induced bipolar and related disorder with onset during intoxication ([place]) 09/08/2016    Past Surgical History:  Procedure Laterality Date  . FINGER SURGERY     Family History:  Family History  Problem Relation Age of Onset  . Diabetes Mother   . Hypertension Mother    Family Psychiatric  History: Maternal Aunt has depression and has made suicide attempts.( PER EHR)  Tobacco Screening: Have you used any form of tobacco in the last 30 days? (Cigarettes, Smokeless Tobacco, Cigars, and/or Pipes): Yes Tobacco use, Select all that apply: 5 or more cigarettes per day Are you interested in Tobacco Cessation Medications?: No, patient refused Counseled patient on smoking cessation including recognizing danger situations, developing coping skills and basic information about quitting provided: Refused/Declined practical counseling Social History: Patient is single , lives in [place] , has SSI . History  Alcohol Use  . Yes    Comment: occasional      History  Drug Use No    Comment: 30 days sober, just completed treatment at [facility] 8/11    Additional Social History:                           Allergies:  No  Known Allergies Lab Results:  Results for orders placed or performed during the hospital encounter of 09/06/16 (from the past 48 hour(s))  Comprehensive metabolic panel     Status: Abnormal   Collection Time: 09/06/16  5:15 PM  Result Value Ref Range   Sodium 137 135 - 145 mmol/L   Potassium 3.7 3.5 - 5.1 mmol/L   Chloride 106 101 - 111 mmol/L   CO2 24 22 - 32 mmol/L   Glucose, Bld 140 (H) 65 - 99 mg/dL   BUN 10 6 - 20 mg/dL   Creatinine, Ser 0.83 0.44 - 1.00 mg/dL   Calcium 9.0 8.9 - 10.3 mg/dL   Total Protein 7.3 6.5 - 8.1 g/dL   Albumin 3.9 3.5 - 5.0 g/dL   AST 20 15 - 41 U/L   ALT 14 14 - 54 U/L   Alkaline Phosphatase 55 38 - 126 U/L   Total Bilirubin 0.5 0.3 - 1.2 mg/dL   GFR calc non Af Amer >60 >60 mL/min  GFR calc Af Amer >60 >60 mL/min    Comment: (NOTE) The eGFR has been calculated using the CKD EPI equation. This calculation has not been validated in all clinical situations. eGFR's persistently <60 mL/min signify possible Chronic Kidney Disease.    Anion gap 7 5 - 15  Ethanol     Status: None   Collection Time: 09/06/16  5:15 PM  Result Value Ref Range   Alcohol, Ethyl (B) <5 <5 mg/dL    Comment:        LOWEST DETECTABLE LIMIT FOR SERUM ALCOHOL IS 5 mg/dL FOR MEDICAL PURPOSES ONLY   Salicylate level     Status: None   Collection Time: 09/06/16  5:15 PM  Result Value Ref Range   Salicylate Lvl <6.7 2.8 - 30.0 mg/dL  Acetaminophen level     Status: Abnormal   Collection Time: 09/06/16  5:15 PM  Result Value Ref Range   Acetaminophen (Tylenol), Serum <10 (L) 10 - 30 ug/mL    Comment:        THERAPEUTIC CONCENTRATIONS VARY SIGNIFICANTLY. A RANGE OF 10-30 ug/mL MAY BE AN EFFECTIVE CONCENTRATION FOR MANY PATIENTS. HOWEVER, SOME ARE BEST TREATED AT CONCENTRATIONS OUTSIDE THIS RANGE. ACETAMINOPHEN CONCENTRATIONS >150 ug/mL AT 4 HOURS AFTER INGESTION AND >50 ug/mL AT 12 HOURS AFTER INGESTION ARE OFTEN ASSOCIATED WITH TOXIC REACTIONS.   cbc     Status:  Abnormal   Collection Time: 09/06/16  5:15 PM  Result Value Ref Range   WBC 6.7 4.0 - 10.5 K/uL   RBC 4.04 3.87 - 5.11 MIL/uL   Hemoglobin 11.9 (L) 12.0 - 15.0 g/dL   HCT 36.1 36.0 - 46.0 %   MCV 89.4 78.0 - 100.0 fL   MCH 29.5 26.0 - 34.0 pg   MCHC 33.0 30.0 - 36.0 g/dL   RDW 16.0 (H) 11.5 - 15.5 %   Platelets 298 150 - 400 K/uL    Blood Alcohol level:  Lab Results  Component Value Date   ETH <5 09/06/2016   ETH <5 61/95/0932    Metabolic Disorder Labs:  No results found for: HGBA1C, MPG No results found for: PROLACTIN No results found for: CHOL, TRIG, HDL, CHOLHDL, VLDL, LDLCALC  Current Medications: Current Facility-Administered Medications  Medication Dose Route Frequency Provider Last Rate Last Dose  . acetaminophen (TYLENOL) tablet 650 mg  650 mg Oral Q4H PRN Patrecia Pour, NP      . alum & mag hydroxide-simeth (MAALOX/MYLANTA) 200-200-20 MG/5ML suspension 30 mL  30 mL Oral PRN Patrecia Pour, NP      . haloperidol (HALDOL) tablet 5 mg  5 mg Oral Q6H PRN Ursula Alert, MD       And  . benztropine (COGENTIN) tablet 1 mg  1 mg Oral Q6H PRN Laverna Dossett, MD      . divalproex (DEPAKOTE) DR tablet 250 mg  250 mg Oral Q8H Xaniyah Buchholz, MD      . gabapentin (NEURONTIN) capsule 300 mg  300 mg Oral TID Patrecia Pour, NP   300 mg at 09/08/16 0841  . ibuprofen (ADVIL,MOTRIN) tablet 600 mg  600 mg Oral Q8H PRN Patrecia Pour, NP      . Influenza vac split quadrivalent PF (FLUARIX) injection 0.5 mL  0.5 mL Intramuscular Tomorrow-1000 Fernando A Cobos, MD      . LORazepam (ATIVAN) tablet 1 mg  1 mg Oral Q4H PRN Ursula Alert, MD       Or  . LORazepam (ATIVAN) injection 1 mg  1 mg Intramuscular Q4H PRN Ursula Alert, MD      . magnesium hydroxide (MILK OF MAGNESIA) suspension 30 mL  30 mL Oral Daily PRN Patrecia Pour, NP      . nicotine (NICODERM CQ - dosed in mg/24 hours) patch 21 mg  21 mg Transdermal Daily PRN Patrecia Pour, NP      . ondansetron [facility]) tablet 4 mg   4 mg Oral Q8H PRN Patrecia Pour, NP      . pneumococcal 23 valent vaccine (PNU-IMMUNE) injection 0.5 mL  0.5 mL Intramuscular Tomorrow-1000 Fernando A Cobos, MD      . QUEtiapine (SEROQUEL) tablet 50 mg  50 mg Oral QHS Jesica Goheen, MD      . traZODone (DESYREL) tablet 100 mg  100 mg Oral QHS PRN Ursula Alert, MD       PTA Medications: Prescriptions Prior to Admission  Medication Sig Dispense Refill Last Dose  . amitriptyline (ELAVIL) 25 MG tablet Take 1 tablet (25 mg total) by mouth at bedtime. For sleep/depression (Patient not taking: Reported on 09/07/2016) 30 tablet 0 Not Taking at Unknown time  . FLUoxetine (PROZAC) 40 MG capsule Take 1 capsule (40 mg total) by mouth daily. For depression (Patient not taking: Reported on 09/07/2016) 30 capsule 0 Not Taking at Unknown time  . gabapentin (NEURONTIN) 100 MG capsule Take 1 capsule (100 mg total) by mouth 3 (three) times daily. For agitation (Patient not taking: Reported on 09/07/2016) 90 capsule 0 Not Taking at Unknown time  . hydrOXYzine (ATARAX/VISTARIL) 25 MG tablet Take 1 tablet (25 mg total) by mouth every 6 (six) hours. For anxiety (Patient not taking: Reported on 09/07/2016) 60 tablet 0 Not Taking at Unknown time  . nicotine (NICODERM CQ - DOSED IN MG/24 HOURS) 21 mg/24hr patch Place 1 patch (21 mg total) onto the skin daily. For smoking cessation (Patient not taking: Reported on 09/07/2016) 28 patch 0 Not Taking at Unknown time  . traZODone (DESYREL) 50 MG tablet Take 1 tablet (50 mg total) by mouth at bedtime. For sleep (Patient not taking: Reported on 09/07/2016) 30 tablet 0 Not Taking at Unknown time    Musculoskeletal: Strength & Muscle Tone: within normal limits Gait & Station: normal Patient leans: N/A  Psychiatric Specialty Exam: Physical Exam  Nursing note and vitals reviewed. Constitutional:  I CONCUR WITH PE DONE IN ED    Review of Systems  Psychiatric/Behavioral: Positive for depression, hallucinations, substance abuse  and suicidal ideas. The patient is nervous/anxious and has insomnia.   All other systems reviewed and are negative.   Blood pressure 119/85, pulse (!) 114, temperature 98.8 F (37.1 C), temperature source Oral, resp. rate 18, height 5' 2"  (1.575 m), weight 54.4 kg (120 lb), last menstrual period 08/17/2016, SpO2 100 %.Body mass index is 21.95 kg/m.  General Appearance: Disheveled  Eye Contact:  Poor  Speech:  Clear and Coherent and Slow  Volume:  Normal  Mood:  Angry, Anxious, Depressed and Irritable  Affect:  Restricted  Thought Process:  Coherent, Goal Directed and Descriptions of Associations: Circumstantial  Orientation:  Other:  PLACE, SITUATION  Thought Content:  Delusions, Hallucinations: Auditory Command:  Kill self and others Visual, Paranoid Ideation and Rumination- also sees shadows  Suicidal Thoughts:  Yes.  with intent/plan  Homicidal Thoughts:  No  Memory:  Immediate;   Fair Recent;   Fair Remote;   Fair  Judgement:  Poor  Insight:  Fair  Psychomotor Activity:  Normal  Concentration:  Concentration: Fair  Recall:  [employer] of Knowledge:  Fair  Language:  Fair  Akathisia:  No  Handed:  Right  AIMS (if indicated):     Assets:  Desire for Improvement Housing Social Support  ADL's:  Intact  Cognition:  WNL  Sleep:  Number of Hours: 5.75       Treatment Plan Summary: Patient is a 40 year old caucasian  female , who has a hx of mood sx as well as substance abuse ( cocaine, cannabis) , who who presented to [facility] ,with psychosis and SI with plan to OD.  Patient is seen as irritable , psychotic and suicidal. Will start treatment. Daily contact with patient to assess and evaluate symptoms and progress in treatment, Medication management and Plan to have patient participate in milieu, individual and group therapy Patient will benefit from inpatient treatment and stabilization.  Estimated length of stay is 5-7 days.  Reviewed past medical records,treatment plan.   Will start Depakote DR 250 mg po q8h for mood sx.depakote level in 5 days . Will start Seroquel 50 mg po qhs for mood sx, psychosis. Will start Trazodone 100 mg po qhs prn for sleep. Will continue Neurontin 300 mg po tid for anxiety sx. Will make available PRN medications as per agitation protocol. Will continue to monitor vitals ,medication compliance and treatment side effects while patient is here.  Will monitor for medical issues as well as call consult as needed.  Reviewed labs uds - pos for cocaine, THC , CBC- wnl, cmp - wnl , EKG - qtc ( 08/21/16- wnl)  ,will order tsh, lipid panel, hba1c, pl if not already done.  CSW will start working on disposition. Pt to be referred to a substance abuse program. Patient to participate in therapeutic milieu .      Observation Level/Precautions:  15 minute checks    Psychotherapy:  Individual and group therapy     Consultations:  csw  Discharge Concerns:  Stability and safety       Physician Treatment Plan for Primary Diagnosis: Substance or medication-induced bipolar and related disorder with onset during intoxication ([place]) Long Term Goal(s): Improvement in symptoms so as ready for discharge  Short Term Goals: Ability to disclose and discuss suicidal ideas and Compliance with prescribed medications will improve  Physician Treatment Plan for Secondary Diagnosis: Principal Problem:Substance or medication-induced bipolar and related disorder with onset during intoxication ([place])    Long Term Goal(s): Improvement in symptoms so as ready for discharge  Short Term Goals: Ability to disclose and discuss suicidal ideas and Compliance with prescribed medications will improve    I certify that inpatient services furnished can reasonably be expected to improve the patient's condition.    Tekila Caillouet, MD 9/12/201712:22 PM

## 2016-09-08 NOTE — BHH Suicide Risk Assessment (Signed)
[facility] Admission Suicide Risk Assessment   Nursing information obtained from:  Patient Demographic factors:  Low socioeconomic status, Unemployed Current Mental Status:  Suicidal ideation indicated by patient, Suicide plan, Self-harm thoughts, Self-harm behaviors Loss Factors:  Financial problems / change in socioeconomic status Historical Factors:  NA Risk Reduction Factors:  Living with another person, especially a relative, Positive social support  Total Time spent with patient: 30 minutes Principal Problem: Substance or medication-induced bipolar and related disorder with onset during intoxication ([place]) Diagnosis:   Patient Active Problem List   Diagnosis Date Noted  . Substance or medication-induced bipolar and related disorder with onset during intoxication ([place]) [F19.94] 09/08/2016  . Major depressive disorder, recurrent episode, moderate degree ([place]) [F33.1] 08/20/2016  . MDD (major depressive disorder), recurrent severe, without psychosis ([place]) [F33.2] 08/08/2015  . Cocaine use disorder, severe, dependence ([place]) [F14.20] 08/08/2015  . Cigar smoker motivated to quit [F17.290] 07/06/2014   Subjective Data: Please see H&P.   Continued Clinical Symptoms:  Alcohol Use Disorder Identification Test Final Score (AUDIT): 0 The "Alcohol Use Disorders Identification Test", Guidelines for Use in Primary Care, Second Edition.  World Pharmacologist [facility]). Score between 0-7:  no or low risk or alcohol related problems. Score between 8-15:  moderate risk of alcohol related problems. Score between 16-19:  high risk of alcohol related problems. Score 20 or above:  warrants further diagnostic evaluation for alcohol dependence and treatment.   CLINICAL FACTORS:   Alcohol/Substance Abuse/Dependencies   Musculoskeletal: Strength & Muscle Tone: within normal limits Gait & Station: normal Patient leans: N/A  Psychiatric Specialty Exam: Physical Exam  Nursing note and vitals reviewed.   ROS  Blood pressure 119/85, pulse (!) 114, temperature 98.8 F (37.1 C), temperature source Oral, resp. rate 18, height 5\' 2"  (1.575 m), weight 54.4 kg (120 lb), last menstrual period 08/17/2016, SpO2 100 %.Body mass index is 21.95 kg/m.          Please see H&P.                                                 COGNITIVE FEATURES THAT CONTRIBUTE TO RISK:  Closed-mindedness, Polarized thinking and Thought constriction (tunnel vision)    SUICIDE RISK:   Moderate:  Frequent suicidal ideation with limited intensity, and duration, some specificity in terms of plans, no associated intent, good self-control, limited dysphoria/symptomatology, some risk factors present, and identifiable protective factors, including available and accessible social support.   PLAN OF CARE: Please see H&P.   I certify that inpatient services furnished can reasonably be expected to improve the patient's condition.  Shakiya Mcneary, MD 09/08/2016, 11:59 AM

## 2016-09-08 NOTE — Progress Notes (Signed)
D: Pt denies SI/HI/AH, Pt +ve VH. Pt is irritable on the unit and spends minimal time out her room. Pt comes out for snack. Pt stated she was trying to go to sleep to get rid of the visions. Pt forwards little information about the voices when asked.    A: Pt was offered support and encouragement. Pt was given scheduled medications. Pt was encourage to attend groups. Q 15 minute checks were done for safety.   R:Pt is taking medication. Pt has no complaints.Pt receptive to treatment and safety maintained on unit.

## 2016-09-08 NOTE — BHH Group Notes (Signed)
[place] LCSW Group Therapy  09/08/2016 , 1:22 PM   Type of Therapy:  Group Therapy  Participation Level:  Active  Participation Quality:  Attentive  Affect:  Appropriate  Cognitive:  Alert  Insight:  Improving  Engagement in Therapy:  Engaged  Modes of Intervention:  Discussion, Exploration and Socialization  Summary of Progress/Problems: Today's group focused on the term Diagnosis.  Participants were asked to define the term, and then pronounce whether it is a negative, positive or neutral term.  Invited.  Chose to not attend.  Roque Lias B 09/08/2016 , 1:22 PM

## 2016-09-08 NOTE — Progress Notes (Signed)
Recreation Therapy Notes  Date: 09/08/16 Time: 1000 Location:  500 Hall Group Room  Group Topic: Self-Esteem  Goal Area(s) Addresses:  Patient will identify the importance of recognizing your uniqueness. Patient will verbalize benefit of increased self-esteem.  Intervention: Architect paper, magazines, scissors, glue sticks, colored pencils  Activity: Advertising.  LRT went over the concept of advertising and the purpose of it.  Patients were to look through the magazines to find pictures or words that describe and highlight the uniqueness about them.  Patients could also use colored pencils to write words or draw pictures if they couldn't find any in the magazines.   Education:  Self-Esteem, Dentist.   Education Outcome: Needs additional education  Clinical Observations/Feedback: Pt did not attend group.   Victorino Sparrow, LRT/CTRS         Victorino Sparrow A 09/08/2016 12:10 PM

## 2016-09-08 NOTE — BHH Suicide Risk Assessment (Signed)
[place] INPATIENT:  Family/Significant Other Suicide Prevention Education  Suicide Prevention Education:  Patient Refusal for Family/Significant Other Suicide Prevention Education: The patient Deborah Melendez has refused to provide written consent for family/significant other to be provided Family/Significant Other Suicide Prevention Education during admission and/or prior to discharge.  Physician notified.  Trish Mage 09/08/2016, 4:38 PM

## 2016-09-08 NOTE — Progress Notes (Signed)
Adult Psychoeducational Group Note  Date:  09/08/2016 Time:  8:29 PM  Group Topic/Focus:  Wrap-Up Group:   The focus of this group is to help patients review their daily goal of treatment and discuss progress on daily workbooks.   Participation Level:  Active  Participation Quality:  Appropriate  Affect:  Appropriate  Cognitive:  Appropriate  Insight: Appropriate  Engagement in Group:  Engaged  Modes of Intervention:  Discussion  Additional Comments:  The  Patient expressed that she had a ok day.The patient also said that she rates today a 5. Nash Shearer 09/08/2016, 8:29 PM

## 2016-09-09 MED ORDER — QUETIAPINE FUMARATE 200 MG PO TABS
200.0000 mg | ORAL_TABLET | Freq: Every day | ORAL | Status: DC
  Administered 2016-09-09: 200 mg via ORAL
  Filled 2016-09-09 (×2): qty 1
  Filled 2016-09-09: qty 7

## 2016-09-09 MED ORDER — ENSURE ENLIVE PO LIQD
237.0000 mL | Freq: Two times a day (BID) | ORAL | Status: DC
  Administered 2016-09-09 – 2016-09-10 (×3): 237 mL via ORAL

## 2016-09-09 NOTE — Tx Team (Signed)
Interdisciplinary Treatment and Diagnostic Plan Update  09/09/2016 Time of Session: 1:51 PM  Deborah Melendez MRN: [id number]  Principal Diagnosis: Substance or medication-induced bipolar and related disorder with onset during intoxication [facility])  Secondary Diagnoses: Principal Problem:   Substance or medication-induced bipolar and related disorder with onset during intoxication (HCC) Active Problems:   Cocaine use disorder, severe, dependence (HCC)   Current Medications:  Current Facility-Administered Medications  Medication Dose Route Frequency Provider Last Rate Last Dose  . acetaminophen (TYLENOL) tablet 650 mg  650 mg Oral Q4H PRN Charm Rings, NP      . alum & mag hydroxide-simeth (MAALOX/MYLANTA) 200-200-20 MG/5ML suspension 30 mL  30 mL Oral PRN Charm Rings, NP      . haloperidol (HALDOL) tablet 5 mg  5 mg Oral Q6H PRN Jomarie Longs, MD       And  . benztropine (COGENTIN) tablet 1 mg  1 mg Oral Q6H PRN Saramma Eappen, MD      . divalproex (DEPAKOTE) DR tablet 250 mg  250 mg Oral Q8H Saramma Eappen, MD   250 mg at 09/09/16 0824  . feeding supplement (ENSURE ENLIVE) (ENSURE ENLIVE) liquid 237 mL  237 mL Oral BID BM Saramma Eappen, MD   237 mL at 09/09/16 1112  . gabapentin (NEURONTIN) capsule 300 mg  300 mg Oral TID Charm Rings, NP   300 mg at 09/09/16 1208  . ibuprofen (ADVIL,MOTRIN) tablet 600 mg  600 mg Oral Q8H PRN Charm Rings, NP      . LORazepam (ATIVAN) tablet 1 mg  1 mg Oral Q4H PRN Jomarie Longs, MD       Or  . LORazepam (ATIVAN) injection 1 mg  1 mg Intramuscular Q4H PRN Saramma Eappen, MD      . magnesium hydroxide (MILK OF MAGNESIA) suspension 30 mL  30 mL Oral Daily PRN Charm Rings, NP      . nicotine (NICODERM CQ - dosed in mg/24 hours) patch 21 mg  21 mg Transdermal Daily PRN Charm Rings, NP      . ondansetron [facility]) tablet 4 mg  4 mg Oral Q8H PRN Charm Rings, NP      . QUEtiapine (SEROQUEL) tablet 50 mg  50 mg Oral QHS Jomarie Longs, MD    50 mg at 09/08/16 2130  . traZODone (DESYREL) tablet 100 mg  100 mg Oral QHS PRN Jomarie Longs, MD   100 mg at 09/08/16 2130    PTA Medications: Prescriptions Prior to Admission  Medication Sig Dispense Refill Last Dose  . amitriptyline (ELAVIL) 25 MG tablet Take 1 tablet (25 mg total) by mouth at bedtime. For sleep/depression (Patient not taking: Reported on 09/07/2016) 30 tablet 0 Not Taking at Unknown time  . FLUoxetine (PROZAC) 40 MG capsule Take 1 capsule (40 mg total) by mouth daily. For depression (Patient not taking: Reported on 09/07/2016) 30 capsule 0 Not Taking at Unknown time  . gabapentin (NEURONTIN) 100 MG capsule Take 1 capsule (100 mg total) by mouth 3 (three) times daily. For agitation (Patient not taking: Reported on 09/07/2016) 90 capsule 0 Not Taking at Unknown time  . hydrOXYzine (ATARAX/VISTARIL) 25 MG tablet Take 1 tablet (25 mg total) by mouth every 6 (six) hours. For anxiety (Patient not taking: Reported on 09/07/2016) 60 tablet 0 Not Taking at Unknown time  . nicotine (NICODERM CQ - DOSED IN MG/24 HOURS) 21 mg/24hr patch Place 1 patch (21 mg total) onto the skin daily. For smoking  cessation (Patient not taking: Reported on 09/07/2016) 28 patch 0 Not Taking at Unknown time  . traZODone (DESYREL) 50 MG tablet Take 1 tablet (50 mg total) by mouth at bedtime. For sleep (Patient not taking: Reported on 09/07/2016) 30 tablet 0 Not Taking at Unknown time    Treatment Modalities: Medication Management, Group therapy, Case management,  1 to 1 session with clinician, Psychoeducation, Recreational therapy.   Physician Treatment Plan for Primary Diagnosis: Substance or medication-induced bipolar and related disorder with onset during intoxication ([place]) Long Term Goal(s): Improvement in symptoms so as ready for discharge  Short Term Goals: Compliance with prescribed medications will improve  Medication Management: Evaluate patient's response, side effects, and tolerance of medication  regimen.  Therapeutic Interventions: 1 to 1 sessions, Unit Group sessions and Medication administration.  Evaluation of Outcomes: Progressing  Physician Treatment Plan for Secondary Diagnosis: Principal Problem:   Substance or medication-induced bipolar and related disorder with onset during intoxication ([place]) Active Problems:   Cocaine use disorder, severe, dependence ([place])   Long Term Goal(s): Improvement in symptoms so as ready for discharge  Short Term Goals: Ability to disclose and discuss suicidal ideas  Medication Management: Evaluate patient's response, side effects, and tolerance of medication regimen.  Therapeutic Interventions: 1 to 1 sessions, Unit Group sessions and Medication administration.  Evaluation of Outcomes: Progressing   RN Treatment Plan for Primary Diagnosis: Substance or medication-induced bipolar and related disorder with onset during intoxication ([place]) Long Term Goal(s): Knowledge of disease and therapeutic regimen to maintain health will improve  Short Term Goals: Ability to participate in decision making will improve and Compliance with prescribed medications will improve  Medication Management: RN will administer medications as ordered by provider, will assess and evaluate patient's response and provide education to patient for prescribed medication. RN will report any adverse and/or side effects to prescribing provider.  Therapeutic Interventions: 1 on 1 counseling sessions, Psychoeducation, Medication administration, Evaluate responses to treatment, Monitor vital signs and CBGs as ordered, Perform/monitor CIWA, COWS, AIMS and Fall Risk screenings as ordered, Perform wound care treatments as ordered.  Evaluation of Outcomes: Progressing   LCSW Treatment Plan for Primary Diagnosis: Substance or medication-induced bipolar and related disorder with onset during intoxication [facility]) Long Term Goal(s): Safe transition to appropriate next level of care at  discharge, Engage patient in therapeutic group addressing interpersonal concerns.  Short Term Goals: Engage patient in aftercare planning with referrals and resources and Identify triggers associated with mental health/substance abuse issues  Therapeutic Interventions: Assess for all discharge needs, 1 to 1 time with Social worker, Explore available resources and support systems, Assess for adequacy in community support network, Educate family and significant other(s) on suicide prevention, Complete Psychosocial Assessment, Interpersonal group therapy.  Evaluation of Outcomes: Progressing   Progress in Treatment: Attending groups: Yes Participating in groups: Yes Taking medication as prescribed: Yes Toleration medication: Yes, no side effects reported at this time Family/Significant other contact made: No Patient understands diagnosis: Yes AEB asking for help with mood swings, getting into rehab Discussing patient identified problems/goals with staff: Yes Medical problems stabilized or resolved: Yes Denies suicidal/homicidal ideation: Yes Issues/concerns per patient self-inventory: None Other: N/A  New problem(s) identified: None identified at this time.   New Short Term/Long Term Goal(s): None identified at this time.   Discharge Plan or Barriers:  Pt asking for referral to [facility] rehab  Reason for Continuation of Hospitalization: Mood instability Depression Hallucinations Medication stabilization  Estimated Length of Stay: 3-5 days  Attendees: Patient:  09/09/2016  1:51 PM  Physician: Ursula Alert, MD 09/09/2016  1:51 PM  Nursing: Gerald Leitz RN 09/09/2016  1:51 PM  RN Care Manager: Lars Pinks, RN 09/09/2016  1:51 PM  Social Worker: Ripley Fraise 09/09/2016  1:51 PM  Recreational Therapist: Marjette  09/09/2016  1:51 PM  Other: Norberto Sorenson 09/09/2016  1:51 PM  Other:  09/09/2016  1:51 PM    Scribe for Treatment Team:  Roque Lias 09/09/2016 1:51 PM

## 2016-09-09 NOTE — Progress Notes (Signed)
Recreation Therapy Notes  Date: 09/09/16 Time: 1000 Location: 500 Hall Dayroom  Group Topic: Coping Skills  Goal Area(s) Addresses:  Patient will be able to identify positive coping skills. Patient will be able to identify how using positive coping skills will help them post d/c.  Intervention: Worksheet, colored pencils  Activity: Building surveyor.  Patients were given a worksheet with a Building surveyor.  Patients were to identify the situations they are facing that have them "stuck" and write them within the lines of the web.  Patients were then asked to come up with coping skills for each of the situations they are facing.  Education: Radiographer, therapeutic, Dentist.   Education Outcome: Needs additional education.   Clinical Observations/Feedback: Pt did not attend group.   Victorino Sparrow, LRT/CTRS         Victorino Sparrow A 09/09/2016 12:51 PM

## 2016-09-09 NOTE — Progress Notes (Signed)
D: Pt labile, pt only comes out room for snacks A: Pt was offered support and encouragement. Pt was given scheduled medications. Pt was encourage to attend groups. Q 15 minute checks were done for safety.   R: Pt is taking medication, safety maintained on unit.

## 2016-09-09 NOTE — Progress Notes (Signed)
Adult Psychoeducational Group Note  Date:  09/09/2016 Time:  8:53 PM  Group Topic/Focus:  Wrap-Up Group:   The focus of this group is to help patients review their daily goal of treatment and discuss progress on daily workbooks.   Participation Level:  Did Not Attend  Participation Quality:  Did not attend  Affect:  Did not attend  Cognitive:  Did not attend  Insight: None  Engagement in Group:  Did not attend  Modes of Intervention:  Did not attend  Additional Comments:  Patient did not attend wrap up group tonight.  Mars Scheaffer L Macaiah Mangal 09/09/2016, 8:53 PM

## 2016-09-09 NOTE — Progress Notes (Signed)
Patient's mood has been labile today. Frequently isolative to room, resting in bed. Forwards minimal information. Affect flat. Denies AVH and states, "how about Friday? I would like to leave Friday. Not any earlier because I need to know my meds are straight and working." Denies pain, physical complaints.  Medicated per orders. No prn's requested or needed. Emotional support offered. Self inventory completion encouraged x 2 and assist offered.   Patient declined to complete self inventory. Denies SI/HI and remains safe on level III obs.

## 2016-09-09 NOTE — BHH Group Notes (Signed)
[place] LCSW Group Therapy  09/09/2016 2:23 PM   Type of Therapy:  Group Therapy   Participation Level:  Engaged  Participation Quality:  Attentive  Affect:  Appropriate   Cognitive:  Alert   Insight:  Engaged  Engagement in Therapy:  Improving   Modes of Intervention:  Education, Exploration, Socialization   Summary of Progress/Problems: Deborah Melendez was invited, chose not to attend.   Deborah Melendez from the [place] was here to tell his story of recovery, inform patients about [facility] and play his guitar.   Deborah Melendez 09/09/2016 2:23 PM

## 2016-09-09 NOTE — Progress Notes (Signed)
[facility] MD Progress Note  09/09/2016 2:32 PM Deborah Melendez  MRN:  [id number] Subjective:  Patient states " I am hearing voices , I am suicidal , I am depressed."    Objective:Patient is a 40year old caucasian  female , who has a hx of mood sx as well as substance abuse ( cocaine, cannabis) , who who presented to [facility] ,with psychosis and SI with plan to OD.  Pt still continues to endorse depression and suicidality. Pt reports AH asking her to kill self . Pt seen as irritable , labile , not really participating in milieu on the unit . Per staff - pt needs a lot of encouragement and support. Pt remains irritable, withdrawn to self . She remains motivated to get substance abuse treatment.  Principal Problem: Substance or medication-induced bipolar and related disorder with onset during intoxication ([place]) ( cocaine, cannabis )  Diagnosis:   Patient Active Problem List   Diagnosis Date Noted  . Substance or medication-induced bipolar and related disorder with onset during intoxication ([place]) [F19.94] 09/08/2016  . Major depressive disorder, recurrent episode, moderate degree ([place]) [F33.1] 08/20/2016  . MDD (major depressive disorder), recurrent severe, without psychosis ([place]) [F33.2] 08/08/2015  . Cocaine use disorder, severe, dependence ([place]) [F14.20] 08/08/2015  . Cigar smoker motivated to quit [F17.290] 07/06/2014   Total Time spent with patient: 25 minutes  Past Psychiatric History:Please see H&P.   Past Medical History:  Past Medical History:  Diagnosis Date  . MRSA (methicillin resistant Staphylococcus aureus)    surgery on finger 3 years ago  . Substance or medication-induced bipolar and related disorder with onset during intoxication ([place]) 09/08/2016    Past Surgical History:  Procedure Laterality Date  . FINGER SURGERY     Family History:  Family History  Problem Relation Age of Onset  . Diabetes Mother   . Hypertension Mother    Family Psychiatric  History: Please see  H&P.  Social History: Please see H&P.  History  Alcohol Use  . Yes    Comment: occasional      History  Drug Use No    Comment: 30 days sober, just completed treatment at [facility] 8/11    Social History   Social History  . Marital status: Single    Spouse name: N/A  . Number of children: N/A  . Years of education: N/A   Social History Main Topics  . Smoking status: Current Every Day Smoker    Packs/day: 0.50    Types: Cigarettes  . Smokeless tobacco: Never Used  . Alcohol use Yes     Comment: occasional   . Drug use: No     Comment: 30 days sober, just completed treatment at [facility] 8/11  . Sexual activity: Yes    Birth control/ protection: None   Other Topics Concern  . None   Social History Narrative  . None   Additional Social History:                         Sleep: restless  Appetite:  Fair  Current Medications: Current Facility-Administered Medications  Medication Dose Route Frequency Provider Last Rate Last Dose  . acetaminophen (TYLENOL) tablet 650 mg  650 mg Oral Q4H PRN Patrecia Pour, NP      . alum & mag hydroxide-simeth (MAALOX/MYLANTA) 200-200-20 MG/5ML suspension 30 mL  30 mL Oral PRN Patrecia Pour, NP      . haloperidol (HALDOL) tablet 5 mg  5  mg Oral Q6H PRN Ursula Alert, MD       And  . benztropine (COGENTIN) tablet 1 mg  1 mg Oral Q6H PRN Damita Eppard, MD      . divalproex (DEPAKOTE) DR tablet 250 mg  250 mg Oral Q8H Lequisha Cammack, MD   250 mg at 09/09/16 0824  . feeding supplement (ENSURE ENLIVE) (ENSURE ENLIVE) liquid 237 mL  237 mL Oral BID BM Arik Husmann, MD   237 mL at 09/09/16 1112  . gabapentin (NEURONTIN) capsule 300 mg  300 mg Oral TID Patrecia Pour, NP   300 mg at 09/09/16 1208  . ibuprofen (ADVIL,MOTRIN) tablet 600 mg  600 mg Oral Q8H PRN Patrecia Pour, NP      . LORazepam (ATIVAN) tablet 1 mg  1 mg Oral Q4H PRN Ursula Alert, MD       Or  . LORazepam (ATIVAN) injection 1 mg  1 mg Intramuscular Q4H PRN  Devesh Monforte, MD      . magnesium hydroxide (MILK OF MAGNESIA) suspension 30 mL  30 mL Oral Daily PRN Patrecia Pour, NP      . nicotine (NICODERM CQ - dosed in mg/24 hours) patch 21 mg  21 mg Transdermal Daily PRN Patrecia Pour, NP      . ondansetron (ZOFRAN) tablet 4 mg  4 mg Oral Q8H PRN Patrecia Pour, NP      . QUEtiapine (SEROQUEL) tablet 200 mg  200 mg Oral QHS Walsie Smeltz, MD      . traZODone (DESYREL) tablet 100 mg  100 mg Oral QHS PRN Ursula Alert, MD   100 mg at 09/08/16 2130    Lab Results:  No results found for this or any previous visit (from the past 48 hour(s)).  Blood Alcohol level:  Lab Results  Component Value Date   ETH <5 09/06/2016   ETH <5 [id number]    Metabolic Disorder Labs: No results found for: HGBA1C, MPG No results found for: PROLACTIN No results found for: CHOL, TRIG, HDL, CHOLHDL, VLDL, LDLCALC  Physical Findings: AIMS: Facial and Oral Movements Muscles of Facial Expression: None, normal Lips and Perioral Area: None, normal Jaw: None, normal Tongue: None, normal,Extremity Movements Upper (arms, wrists, hands, fingers): None, normal Lower (legs, knees, ankles, toes): None, normal, Trunk Movements Neck, shoulders, hips: None, normal, Overall Severity Severity of abnormal movements (highest score from questions above): None, normal Incapacitation due to abnormal movements: None, normal Patient's awareness of abnormal movements (rate only patient's report): No Awareness, Dental Status Current problems with teeth and/or dentures?: Yes Does patient usually wear dentures?: No  CIWA:    COWS:     Musculoskeletal: Strength & Muscle Tone: within normal limits Gait & Station: normal Patient leans: N/A  Psychiatric Specialty Exam: Physical Exam  Nursing note and vitals reviewed.   Review of Systems  Psychiatric/Behavioral: Positive for depression, hallucinations, substance abuse and suicidal ideas. The patient is nervous/anxious and has  insomnia.   All other systems reviewed and are negative.   Blood pressure 110/87, pulse (!) 120, temperature 97.8 F (36.6 C), temperature source Oral, resp. rate 18, height 5\' 2"  (1.575 m), weight 54.4 kg (120 lb), last menstrual period 08/17/2016, SpO2 100 %.Body mass index is 21.95 kg/m.  General Appearance: Disheveled  Eye Contact:  Poor  Speech:  Normal Rate  Volume:  Increased  Mood:  Anxious and Depressed  Affect:  Depressed and irritable  Thought Process:  Coherent and Goal Directed  Orientation:  Full (Time, Place, and Person)  Thought Content:  Hallucinations: Auditory Command:  kill self and Paranoid Ideation  Suicidal Thoughts:  Yes.  without intent/plan but is hearing command AH   Homicidal Thoughts:  No  Memory:  Immediate;   Fair Recent;   Fair Remote;   Fair  Judgement:  Improving  Insight:  Fair  Psychomotor Activity:  Normal  Concentration:  Concentration: Fair and Attention Span: Fair  Recall:  [employer] of Knowledge:  Fair  Language:  Fair  Akathisia:  No  Handed:  Right  AIMS (if indicated):     Assets:  Desire for Improvement Housing Social Support  ADL's:  Intact  Cognition:  WNL  Sleep:  Number of Hours: 8     Treatment Plan Summary::Patient is a 40year old caucasian  female , who has a hx of mood sx as well as substance abuse ( cocaine, cannabis) , who who presented to [facility] ,with psychosis and SI with plan to OD.  Patient will continue to need medication readjustment and observation on the unit. Daily contact with patient to assess and evaluate symptoms and progress in treatment and Medication management.   Will continue  Depakote DR 250 mg po q8h for mood sx.Depakote level on 09/13/16. Will increase Seroquel to 200  mg po qhs for mood sx, psychosis. Will continueTrazodone 100 mg po qhs prn for sleep. Will continue Neurontin 300 mg po tid for anxiety sx. Will make available PRN medications as per agitation protocol. Will continue to monitor  vitals ,medication compliance and treatment side effects while patient is here.  Will monitor for medical issues as well as call consult as needed.  Reviewed labs uds - pos for cocaine, THC , CBC- wnl, cmp - wnl , EKG - qtc ( 08/21/16- wnl)  ,pt declined labs -  lipid panel, hba1c, pl . CSW will continue working on disposition. Pt referred to a substance abuse program. Patient to participate in therapeutic milieu .      Zoye Chandra, MD 09/09/2016, 2:32 PM

## 2016-09-09 NOTE — Plan of Care (Signed)
Problem: Health Behavior/Discharge Planning: Goal: Compliance with treatment plan for underlying cause of condition will improve Outcome: Not Progressing Patient refusing groups.   Problem: Medication: Goal: Compliance with prescribed medication regimen will improve Outcome: Not Progressing Patient only willing to take certain medications.

## 2016-09-09 NOTE — Progress Notes (Signed)
Recreation Therapy Notes  INPATIENT RECREATION THERAPY ASSESSMENT  Patient Details Name: Deborah Melendez MRN: [id number] DOB: 05/15/1976 Today's Date: 09/09/2016  Patient Stressors: Relationship, Other (Comment) (Drugs)  Pt stated she was here for suicidal thoughts.  Coping Skills:   Isolate, Arguments, Substance Abuse, Avoidance, Self-Injury  Personal Challenges: Anger, Communication, Concentration, Decision-Making, Self-Esteem/Confidence, Substance Abuse, Trusting Others  Leisure Interests (2+):  [place]  Awareness of Community Resources:  Yes  Community Resources:  Church  Current Use: No  If no, Barriers?: Other (Comment), Social (I don't want to go)  Patient Strengths:  "I don't know"  Patient Identified Areas of Improvement:  "I don't know"  Current Recreation Participation:  "Never"  Patient Goal for Hospitalization:  "I don't know"  [place] of Residence:  [place] of Residence:  [place]   Current [place] (including self-harm):  No  Current HI:  No  Consent to Intern Participation: N/A   Victorino Sparrow, LRT/CTRS  Victorino Sparrow A 09/09/2016, 2:57 PM

## 2016-09-10 ENCOUNTER — Encounter (HOSPITAL_COMMUNITY): Payer: Self-pay | Admitting: Psychiatry

## 2016-09-10 MED ORDER — NICOTINE 21 MG/24HR TD PT24: 21.0000 mg | MEDICATED_PATCH | Freq: Every day | TRANSDERMAL | 0 refills | Status: DC

## 2016-09-10 MED ORDER — HYDROXYZINE HCL 25 MG PO TABS: 25.0000 mg | ORAL_TABLET | Freq: Four times a day (QID) | ORAL | 0 refills | Status: DC

## 2016-09-10 MED ORDER — GABAPENTIN 100 MG PO CAPS: 100.0000 mg | ORAL_CAPSULE | Freq: Three times a day (TID) | ORAL | 0 refills | Status: DC

## 2016-09-10 MED ORDER — QUETIAPINE FUMARATE 200 MG PO TABS: 200.0000 mg | ORAL_TABLET | Freq: Every day | ORAL | 0 refills | Status: DC

## 2016-09-10 MED ORDER — DIVALPROEX SODIUM 250 MG PO DR TAB: 250.0000 mg | DELAYED_RELEASE_TABLET | Freq: Three times a day (TID) | ORAL | 0 refills | Status: DC

## 2016-09-10 MED ORDER — TRAZODONE HCL 100 MG PO TABS: 100.0000 mg | ORAL_TABLET | Freq: Every evening | ORAL | 0 refills | Status: DC | PRN

## 2016-09-10 NOTE — BHH Suicide Risk Assessment (Signed)
[facility] Discharge Suicide Risk Assessment   Principal Problem: Substance or medication-induced bipolar and related disorder with onset during intoxication [facility]) Discharge Diagnoses:  Patient Active Problem List   Diagnosis Date Noted  . Substance or medication-induced bipolar and related disorder with onset during intoxication ([place]) [F19.94] 09/08/2016  . Major depressive disorder, recurrent episode, moderate degree ([place]) [F33.1] 08/20/2016  . MDD (major depressive disorder), recurrent severe, without psychosis ([place]) [F33.2] 08/08/2015  . Cocaine use disorder, severe, dependence ([place]) [F14.20] 08/08/2015  . Cigar smoker motivated to quit [F17.290] 07/06/2014    Total Time spent with patient: 30 minutes  Musculoskeletal: Strength & Muscle Tone: within normal limits Gait & Station: normal Patient leans: N/A  Psychiatric Specialty Exam: Review of Systems  Psychiatric/Behavioral: Negative for depression and suicidal ideas.  All other systems reviewed and are negative.   Blood pressure 106/77, pulse (!) 141, temperature 98.5 F (36.9 C), temperature source Oral, resp. rate 18, height 5\' 2"  (1.575 m), weight 54.4 kg (120 lb), last menstrual period 08/17/2016, SpO2 100 %.Body mass index is 21.95 kg/m.  General Appearance: Casual  Eye Contact::  Fair  Speech:  Clear and Coherent409  Volume:  Normal  Mood:  Euthymic  Affect:  Appropriate  Thought Process:  Goal Directed and Descriptions of Associations: Intact  Orientation:  Full (Time, Place, and Person)  Thought Content:  Logical  Suicidal Thoughts:  No  Homicidal Thoughts:  No  Memory:  Immediate;   Fair Recent;   Fair Remote;   Fair  Judgement:  Fair  Insight:  Fair  Psychomotor Activity:  Normal  Concentration:  Fair  Recall:  [employer] of Knowledge:Fair  Language: Fair  Akathisia:  No  Handed:  Right  AIMS (if indicated):     Assets:  Desire for Improvement  Sleep:  Number of Hours: 8  Cognition: WNL  ADL's:  Intact    Mental Status Per Nursing Assessment::   On Admission:  Suicidal ideation indicated by patient, Suicide plan, Self-harm thoughts, Self-harm behaviors  Demographic Factors:  Unemployed  Loss Factors: NA  Historical Factors: Impulsivity  Risk Reduction Factors:   Positive social support and Positive therapeutic relationship  Continued Clinical Symptoms:  Alcohol/Substance Abuse/Dependencies Previous Psychiatric Diagnoses and Treatments  Cognitive Features That Contribute To Risk:  None    Suicide Risk:  Minimal: No identifiable suicidal ideation.  Patients presenting with no risk factors but with morbid ruminations; may be classified as minimal risk based on the severity of the depressive symptoms  Follow-up Information    [facility] Follow up on 09/14/2016.   Why:  Monday at 11:45 for your screening for admission appointment Contact information: [place] [address] [phone]           Plan Of Care/Follow-up recommendations:  Activity:  no restrictions Tests:  depakote level on monday 09/14/16  Ketara Cavness, MD 09/10/2016, 11:03 AM

## 2016-09-10 NOTE — Discharge Summary (Signed)
Physician Discharge Summary Note  Patient:  Deborah Melendez is an 40 y.o., female MRN:  [id number] DOB:  May 03, 1976 Patient phone:  [phone] (home)  Patient address:   [address] [place] [address],  Total Time spent with patient: 45 minutes  Date of Admission:  09/07/2016 Date of Discharge: 09/10/2016  Reason for Admission:   Patient is a 40year old caucasian  female , who has a hx of mood sx as well as substance abuse ( cocaine, cannabis) , who who presented to [facility] ,with psychosis and SI with plan to OD.   Per initial notes in EHR : " Patient presents as agitated and verbally aggressive during admission.Patient verbalizes SI with plan to "overdose on pills". She verbally contracts for safety upon admission. Patient reports Auditory Hallucinations stating "I hear bad people telling me to do bad stuff". Patient was recently discharged from [facility] and states "I didn't follow up with [facility]". Patient verbalizes cocaine use with last use "2-3 days ago".   Patient seen and chart reviewed TODAY.Discussed patient with treatment team. Patient today is seen as labile , irritable , anxious and paranoid. Pt seen as withdrawn to self, avoids eye contact and continues to endorse AH with command voices asking her to kill self or others. Pt reports also has mood swings - periods when she is manic , has high energy , sleep issues and talks a lot. Pt reports she had 1 episode couple of weeks ago. Pt at this times reports feeling depressed and anxious . Pt also endorsed SI with plan as noted above.  Pt reports panic sx - when she is upset"- reports SOB, anxiety sx, racing heart rate and chest pain.  Pt reports abusing cocaine - past 20 yrs - 30$ per day , cannabis daily - unable to elaborate how much.  Pt reports she is motivated to get help with her substance abuse issues - CSW to help with the same.  Pt reports being on Prozac and elavil - during last admission - but states she  stopped it since they were not working.  Pt does not follow up with an out patient provider , has had several admissions in the past - including most recently at [facility] - few weeks ago. Pt reports suicide attempts in the past x3.    Principal Problem: Substance or medication-induced bipolar and related disorder with onset during intoxication [facility]) Discharge Diagnoses: Patient Active Problem List   Diagnosis Date Noted  . Substance or medication-induced bipolar and related disorder with onset during intoxication ([place]) [F19.94] 09/08/2016  . Major depressive disorder, recurrent episode, moderate degree ([place]) [F33.1] 08/20/2016  . MDD (major depressive disorder), recurrent severe, without psychosis ([place]) [F33.2] 08/08/2015  . Cocaine use disorder, severe, dependence ([place]) [F14.20] 08/08/2015  . Cigar smoker motivated to quit [F17.290] 07/06/2014    Past Psychiatric History: See H&P  Past Medical History:  Past Medical History:  Diagnosis Date  . MRSA (methicillin resistant Staphylococcus aureus)    surgery on finger 3 years ago  . Substance or medication-induced bipolar and related disorder with onset during intoxication ([place]) 09/08/2016    Past Surgical History:  Procedure Laterality Date  . FINGER SURGERY     Family History:  Family History  Problem Relation Age of Onset  . Diabetes Mother   . Hypertension Mother    Family Psychiatric  History: See H&P Social History:  History  Alcohol Use  . Yes    Comment: occasional  History  Drug Use No    Comment: 30 days sober, just completed treatment at [facility] 8/11    Social History   Social History  . Marital status: Single    Spouse name: N/A  . Number of children: N/A  . Years of education: N/A   Social History Main Topics  . Smoking status: Current Every Day Smoker    Packs/day: 0.50    Types: Cigarettes  . Smokeless tobacco: Never Used  . Alcohol use Yes     Comment: occasional   . Drug use: No      Comment: 30 days sober, just completed treatment at [facility] 8/11  . Sexual activity: Yes    Birth control/ protection: None   Other Topics Concern  . None   Social History Narrative  . None    Hospital Course:   Deborah Melendez was admitted for Substance or medication-induced bipolar and related disorder with onset during intoxication [facility]) , with psychosis and crisis management.  Pt was treated discharged with the medications listed below under Medication List.  Medical problems were identified and treated as needed.  Home medications were restarted as appropriate.  Improvement was monitored by observation and Deborah Melendez 's daily report of symptom reduction.  Emotional and mental status was monitored by daily self-inventory reports completed by Deborah Melendez and clinical staff.         Deborah Melendez was evaluated by the treatment team for stability and plans for continued recovery upon discharge. Deborah Melendez 's motivation was an integral factor for scheduling further treatment. Employment, transportation, bed availability, health status, family support, and any pending legal issues were also considered during hospital stay. Pt was offered further treatment options upon discharge including but not limited to Residential, Intensive Outpatient, and Outpatient treatment.  Deborah Melendez will follow up with the services as listed below under Follow Up Information.     Upon completion of this admission the patient was both mentally and medically stable for discharge denying suicidal/homicidal ideation, auditory/visual/tactile hallucinations, delusional thoughts and paranoia.    Family session went well. No seclusion or restraint.  Deborah Melendez responded well to treatment with Depakote, Neurontin, Vistaril, Nicoderm, Seroquel, and trazodone without adverse effects. Pt demonstrated improvement without reported or observed adverse effects to the point of stability appropriate  for outpatient management. Pertinent labs include: UDS + cocaine, THC. And pt will need outpatient Depakote level as below:   Physical Findings: AIMS: Facial and Oral Movements Muscles of Facial Expression: None, normal Lips and Perioral Area: None, normal Jaw: None, normal Tongue: None, normal,Extremity Movements Upper (arms, wrists, hands, fingers): None, normal Lower (legs, knees, ankles, toes): None, normal, Trunk Movements Neck, shoulders, hips: None, normal, Overall Severity Severity of abnormal movements (highest score from questions above): None, normal Incapacitation due to abnormal movements: None, normal Patient's awareness of abnormal movements (rate only patient's report): No Awareness, Dental Status Current problems with teeth and/or dentures?: Yes Does patient usually wear dentures?: No  CIWA:    COWS:     Musculoskeletal: Strength & Muscle Tone: within normal limits Gait & Station: normal Patient leans: N/A  Psychiatric Specialty Exam: Physical Exam  Review of Systems  Psychiatric/Behavioral: Positive for depression and substance abuse. Negative for hallucinations and suicidal ideas. The patient is nervous/anxious and has insomnia.   All other systems reviewed and are negative.   Blood pressure 106/77, pulse (!) 141, temperature 98.5 F (36.9 C), temperature source Oral, resp. rate 18, height 5\' 2"  (1.575 m), weight  54.4 kg (120 lb), last menstrual period 08/17/2016, SpO2 100 %.Body mass index is 21.95 kg/m.  SEE MD PSE in SRA   Have you used any form of tobacco in the last 30 days? (Cigarettes, Smokeless Tobacco, Cigars, and/or Pipes): Yes  Has this patient used any form of tobacco in the last 30 days? (Cigarettes, Smokeless Tobacco, Cigars, and/or Pipes) Yes, Yes, A prescription for an FDA-approved tobacco cessation medication was offered at discharge and the patient refused  Blood Alcohol level:  Lab Results  Component Value Date   ETH <5 09/06/2016   ETH  <5 [id number]    Metabolic Disorder Labs:  No results found for: HGBA1C, MPG No results found for: PROLACTIN No results found for: CHOL, TRIG, HDL, CHOLHDL, VLDL, LDLCALC  See Psychiatric Specialty Exam and Suicide Risk Assessment completed by Attending Physician prior to discharge.  Discharge destination:  Home  Is patient on multiple antipsychotic therapies at discharge:  No   Has Patient had three or more failed trials of antipsychotic monotherapy by history:  No  Recommended Plan for Multiple Antipsychotic Therapies: NA     Medication List    STOP taking these medications   amitriptyline 25 MG tablet Commonly known as:  ELAVIL   FLUoxetine 40 MG capsule Commonly known as:  PROZAC     TAKE these medications     Indication  divalproex 250 MG DR tablet Commonly known as:  DEPAKOTE Take 1 tablet (250 mg total) by mouth every 8 (eight) hours.  Indication:  mood stabilization   gabapentin 100 MG capsule Commonly known as:  NEURONTIN Take 1 capsule (100 mg total) by mouth 3 (three) times daily. What changed:  additional instructions  Indication:  Agitation   hydrOXYzine 25 MG tablet Commonly known as:  ATARAX/VISTARIL Take 1 tablet (25 mg total) by mouth every 6 (six) hours. For anxiety  Indication:  Anxiety Neurosis   nicotine 21 mg/24hr patch Commonly known as:  NICODERM CQ - dosed in mg/24 hours Place 1 patch (21 mg total) onto the skin daily. For smoking cessation  Indication:  Nicotine Addiction   QUEtiapine 200 MG tablet Commonly known as:  SEROQUEL Take 1 tablet (200 mg total) by mouth at bedtime.  Indication:  mood stabilization   traZODone 100 MG tablet Commonly known as:  DESYREL Take 1 tablet (100 mg total) by mouth at bedtime as needed for sleep. What changed:  medication strength  how much to take  when to take this  reasons to take this  additional instructions  Indication:  Trouble Sleeping      Follow-up Information    [facility] Follow up on 09/14/2016.   Why:  Monday at 11:45 for your screening for admission appointment Contact information: [address] [place] [address] [phone]           Follow-up recommendations:  Activity:  As tolerated Diet:  Heart healthy with low sodium. Other:  Get Depakote Level in 5 days outpatient  Comments:   Take all medications as prescribed. Keep all follow-up appointments as scheduled.  Do not consume alcohol or use illegal drugs while on prescription medications. Report any adverse effects from your medications to your primary care provider promptly.  In the event of recurrent symptoms or worsening symptoms, call 911, a crisis hotline, or go to the nearest emergency department for evaluation.   Signed: Benjamine Mola, FNP 09/10/2016, 11:25 AM

## 2016-09-10 NOTE — Progress Notes (Signed)
Recreation Therapy Notes  Date: 09/10/16 Time: 1000 Location: 500 Hall Dayroom  Group Topic: Communication, Team Building, Problem Solving  Goal Area(s) Addresses:  Patient will effectively work with peer towards shared goal.  Patient will identify skill used to make activity successful.  Patient will identify how skills used during activity can be used to reach post d/c goals.   Intervention: STEM Activity   Activity: [employer]. Patients were provided the following materials: 5 drinking straws, 5 rubber bands, 5 paper clips, 2 index cards, 2 drinking cups, and 2 toilet paper rolls. Using the provided materials patients were asked to build a launching mechanisms to launch a ping pong ball approximately 12 feet. Patients were divided into teams of 3-5.   Education: Education officer, community, Dentist.   Education Outcome: Needs additional education.   Clinical Observations/Feedback: Pt did not attend group.   Victorino Sparrow, LRT/CTRS         Victorino Sparrow A 09/10/2016 12:23 PM

## 2016-09-10 NOTE — Tx Team (Signed)
Interdisciplinary Treatment and Diagnostic Plan Update  09/10/2016 Time of Session: 1:19 PM  Deborah Melendez MRN: [id number]  Principal Diagnosis: Substance or medication-induced bipolar and related disorder with onset during intoxication [facility])  Secondary Diagnoses: Principal Problem:   Substance or medication-induced bipolar and related disorder with onset during intoxication ([place]) Active Problems:   Cocaine use disorder, severe, dependence ([place])   Current Medications:  Current Facility-Administered Medications  Medication Dose Route Frequency Provider Last Rate Last Dose  . acetaminophen (TYLENOL) tablet 650 mg  650 mg Oral Q4H PRN Patrecia Pour, NP      . alum & mag hydroxide-simeth (MAALOX/MYLANTA) 200-200-20 MG/5ML suspension 30 mL  30 mL Oral PRN Patrecia Pour, NP      . haloperidol (HALDOL) tablet 5 mg  5 mg Oral Q6H PRN Ursula Alert, MD       And  . benztropine (COGENTIN) tablet 1 mg  1 mg Oral Q6H PRN Saramma Eappen, MD      . divalproex (DEPAKOTE) DR tablet 250 mg  250 mg Oral Q8H Saramma Eappen, MD   250 mg at 09/10/16 0643  . feeding supplement (ENSURE ENLIVE) (ENSURE ENLIVE) liquid 237 mL  237 mL Oral BID BM Saramma Eappen, MD   237 mL at 09/10/16 0958  . gabapentin (NEURONTIN) capsule 300 mg  300 mg Oral TID Patrecia Pour, NP   300 mg at 09/10/16 0815  . ibuprofen (ADVIL,MOTRIN) tablet 600 mg  600 mg Oral Q8H PRN Patrecia Pour, NP      . LORazepam (ATIVAN) tablet 1 mg  1 mg Oral Q4H PRN Ursula Alert, MD       Or  . LORazepam (ATIVAN) injection 1 mg  1 mg Intramuscular Q4H PRN Saramma Eappen, MD      . magnesium hydroxide (MILK OF MAGNESIA) suspension 30 mL  30 mL Oral Daily PRN Patrecia Pour, NP      . nicotine (NICODERM CQ - dosed in mg/24 hours) patch 21 mg  21 mg Transdermal Daily PRN Patrecia Pour, NP      . ondansetron [facility]) tablet 4 mg  4 mg Oral Q8H PRN Patrecia Pour, NP      . QUEtiapine (SEROQUEL) tablet 200 mg  200 mg Oral QHS Ursula Alert, MD    200 mg at 09/09/16 2110  . traZODone (DESYREL) tablet 100 mg  100 mg Oral QHS PRN Ursula Alert, MD   100 mg at 09/08/16 2130    PTA Medications: Prescriptions Prior to Admission  Medication Sig Dispense Refill Last Dose  . amitriptyline (ELAVIL) 25 MG tablet Take 1 tablet (25 mg total) by mouth at bedtime. For sleep/depression (Patient not taking: Reported on 09/07/2016) 30 tablet 0 Not Taking at Unknown time  . FLUoxetine (PROZAC) 40 MG capsule Take 1 capsule (40 mg total) by mouth daily. For depression (Patient not taking: Reported on 09/07/2016) 30 capsule 0 Not Taking at Unknown time  . traZODone (DESYREL) 50 MG tablet Take 1 tablet (50 mg total) by mouth at bedtime. For sleep (Patient not taking: Reported on 09/07/2016) 30 tablet 0 Not Taking at Unknown time  . [DISCONTINUED] gabapentin (NEURONTIN) 100 MG capsule Take 1 capsule (100 mg total) by mouth 3 (three) times daily. For agitation (Patient not taking: Reported on 09/07/2016) 90 capsule 0 Not Taking at Unknown time  . [DISCONTINUED] hydrOXYzine (ATARAX/VISTARIL) 25 MG tablet Take 1 tablet (25 mg total) by mouth every 6 (six) hours. For anxiety (Patient not taking: Reported  on 09/07/2016) 60 tablet 0 Not Taking at Unknown time  . [DISCONTINUED] nicotine (NICODERM CQ - DOSED IN MG/24 HOURS) 21 mg/24hr patch Place 1 patch (21 mg total) onto the skin daily. For smoking cessation (Patient not taking: Reported on 09/07/2016) 28 patch 0 Not Taking at Unknown time    Treatment Modalities: Medication Management, Group therapy, Case management,  1 to 1 session with clinician, Psychoeducation, Recreational therapy.   Physician Treatment Plan for Primary Diagnosis: Substance or medication-induced bipolar and related disorder with onset during intoxication ([place]) Long Term Goal(s): Improvement in symptoms so as ready for discharge  Short Term Goals: Compliance with prescribed medications will improve  Medication Management: Evaluate patient's  response, side effects, and tolerance of medication regimen.  Therapeutic Interventions: 1 to 1 sessions, Unit Group sessions and Medication administration.  Evaluation of Outcomes: Adequate for Discharge  Physician Treatment Plan for Secondary Diagnosis: Principal Problem:   Substance or medication-induced bipolar and related disorder with onset during intoxication ([place]) Active Problems:   Cocaine use disorder, severe, dependence ([place])   Long Term Goal(s): Improvement in symptoms so as ready for discharge  Short Term Goals: Ability to disclose and discuss suicidal ideas  Medication Management: Evaluate patient's response, side effects, and tolerance of medication regimen.  Therapeutic Interventions: 1 to 1 sessions, Unit Group sessions and Medication administration.  Evaluation of Outcomes: Adequate for Discharge   RN Treatment Plan for Primary Diagnosis: Substance or medication-induced bipolar and related disorder with onset during intoxication ([place]) Long Term Goal(s): Knowledge of disease and therapeutic regimen to maintain health will improve  Short Term Goals: Ability to participate in decision making will improve and Compliance with prescribed medications will improve  Medication Management: RN will administer medications as ordered by provider, will assess and evaluate patient's response and provide education to patient for prescribed medication. RN will report any adverse and/or side effects to prescribing provider.  Therapeutic Interventions: 1 on 1 counseling sessions, Psychoeducation, Medication administration, Evaluate responses to treatment, Monitor vital signs and CBGs as ordered, Perform/monitor CIWA, COWS, AIMS and Fall Risk screenings as ordered, Perform wound care treatments as ordered.  Evaluation of Outcomes: Adequate for Discharge   LCSW Treatment Plan for Primary Diagnosis: Substance or medication-induced bipolar and related disorder with onset during  intoxication [facility]) Long Term Goal(s): Safe transition to appropriate next level of care at discharge, Engage patient in therapeutic group addressing interpersonal concerns.  Short Term Goals: Engage patient in aftercare planning with referrals and resources and Identify triggers associated with mental health/substance abuse issues  Therapeutic Interventions: Assess for all discharge needs, 1 to 1 time with Social worker, Explore available resources and support systems, Assess for adequacy in community support network, Educate family and significant other(s) on suicide prevention, Complete Psychosocial Assessment, Interpersonal group therapy.  Evaluation of Outcomes: Adequate for Discharge   Progress in Treatment: Attending groups: No Participating in groups: No Taking medication as prescribed: Yes Toleration medication: Yes, no side effects reported at this time Family/Significant other contact made: No Patient understands diagnosis: Yes AEB asking for help with mood swings, getting into rehab Discussing patient identified problems/goals with staff: Yes Medical problems stabilized or resolved: Yes Denies suicidal/homicidal ideation: Yes Issues/concerns per patient self-inventory: None Other: N/A  New problem(s) identified: None identified at this time.   New Short Term/Long Term Goal(s): None identified at this time.   Discharge Plan or Barriers:  Pt asking for referral to [facility] rehab 9/14:  Pt states she will stay with relatives and go  to [facility] on monday Reason for Continuation of Hospitalization:  Estimated Length of Stay: D/C today  Attendees: Patient: 09/10/2016  1:19 PM  Physician: Ursula Alert, MD 09/10/2016  1:19 PM  Nursing: Gerald Leitz RN 09/10/2016  1:19 PM  RN Care Manager: Lars Pinks, RN 09/10/2016  1:19 PM  Social Worker: Ripley Fraise 09/10/2016  1:19 PM  Recreational Therapist: Marjette  09/10/2016  1:19 PM  Other: Norberto Sorenson 09/10/2016  1:19 PM  Other:   09/10/2016  1:19 PM    Scribe for Treatment Team:  Roque Lias 09/10/2016 1:19 PM

## 2016-09-10 NOTE — Progress Notes (Signed)
Patient discharged to lobby. Patient was stable and appreciative at that time. All papers, and prescriptions were given and valuables returned. Patient refused samples offered.  Verbal understanding expressed. Denies SI/HI and A/VH. Patient given opportunity to express concerns and ask questions.

## 2016-09-10 NOTE — Progress Notes (Signed)
  [facility] Adult Case Management Discharge Plan :  Will you be returning to the same living situation after discharge:  No. At discharge, do you have transportation home?: Yes,  pt called cab Do you have the ability to pay for your medications: Yes,  MCD  Release of information consent forms completed and in the chart;  Patient's signature needed at discharge.  Patient to Follow up at: Follow-up Information    [facility] Follow up on 09/14/2016.   Why:  Monday at 11:45 for your screening for admission appointment Contact information: [place] [address] [phone]           Next level of care provider has access to [place] and Suicide Prevention discussed: Yes,  yes  Have you used any form of tobacco in the last 30 days? (Cigarettes, Smokeless Tobacco, Cigars, and/or Pipes): Yes  Has patient been referred to the Quitline?: Patient refused referral  Patient has been referred for addiction treatment: Yes  Trish Mage 09/10/2016, 1:22 PM

## 2016-09-10 NOTE — [facility] Group Notes (Signed)
Type of Therapy: Process Group Therapy  Participation Level:  Active  Participation Quality:  Appropriate  Affect:  Flat  Cognitive:  Oriented  Insight:  Improving  Engagement in Group:  Limited  Engagement in Therapy:  Limited  Modes of Intervention:  Activity, Clarification, Education, Problem-solving and Support  Summary of Progress/Problems: Today's group addressed balance in life. Patients participated in the discussion about when their life was in balance and out of balance and how this feels. Patients discussed ways to get back in balance and short term goals they can work on to get where they want to be.  Patient was invited, chose not to attend.

## 2017-01-18 ENCOUNTER — Emergency Department (HOSPITAL_COMMUNITY)
Admission: EM | Admit: 2017-01-18 | Discharge: 2017-01-19 | Disposition: A | Discharge status: Other Acute Inpatient Hospital | Payer: Self-pay | Attending: Emergency Medicine | Admitting: Emergency Medicine

## 2017-01-18 ENCOUNTER — Encounter (HOSPITAL_COMMUNITY): Payer: Self-pay | Admitting: Emergency Medicine

## 2017-01-18 DIAGNOSIS — F17210 Nicotine dependence, cigarettes, uncomplicated: Secondary | ICD-10-CM | POA: Insufficient documentation

## 2017-01-18 DIAGNOSIS — Z79899 Other long term (current) drug therapy: Secondary | ICD-10-CM | POA: Insufficient documentation

## 2017-01-18 DIAGNOSIS — F32A Depression, unspecified: Secondary | ICD-10-CM

## 2017-01-18 DIAGNOSIS — R45851 Suicidal ideations: Secondary | ICD-10-CM

## 2017-01-18 DIAGNOSIS — F329 Major depressive disorder, single episode, unspecified: Secondary | ICD-10-CM | POA: Insufficient documentation

## 2017-01-18 LAB — COMPREHENSIVE METABOLIC PANEL
ALK PHOS: 43 U/L (ref 38–126)
ALT: 12 U/L — AB (ref 14–54)
ANION GAP: 7 (ref 5–15)
AST: 18 U/L (ref 15–41)
Albumin: 3.7 g/dL (ref 3.5–5.0)
BILIRUBIN TOTAL: 0.8 mg/dL (ref 0.3–1.2)
BUN: 16 mg/dL (ref 6–20)
CALCIUM: 8.5 mg/dL — AB (ref 8.9–10.3)
CO2: 21 mmol/L — AB (ref 22–32)
CREATININE: 0.81 mg/dL (ref 0.44–1.00)
Chloride: 113 mmol/L — ABNORMAL HIGH (ref 101–111)
Glucose, Bld: 98 mg/dL (ref 65–99)
Potassium: 3.7 mmol/L (ref 3.5–5.1)
Sodium: 141 mmol/L (ref 135–145)
TOTAL PROTEIN: 6.4 g/dL — AB (ref 6.5–8.1)

## 2017-01-18 LAB — ACETAMINOPHEN LEVEL

## 2017-01-18 LAB — RAPID URINE DRUG SCREEN, HOSP PERFORMED
Amphetamines: NOT DETECTED
BARBITURATES: NOT DETECTED
Benzodiazepines: NOT DETECTED
COCAINE: POSITIVE — AB
OPIATES: NOT DETECTED
Tetrahydrocannabinol: POSITIVE — AB

## 2017-01-18 LAB — CBC
HCT: 32.7 % — ABNORMAL LOW (ref 36.0–46.0)
Hemoglobin: 10.7 g/dL — ABNORMAL LOW (ref 12.0–15.0)
MCH: 29.1 pg (ref 26.0–34.0)
MCHC: 32.7 g/dL (ref 30.0–36.0)
MCV: 88.9 fL (ref 78.0–100.0)
Platelets: 230 10*3/uL (ref 150–400)
RBC: 3.68 MIL/uL — ABNORMAL LOW (ref 3.87–5.11)
RDW: 16 % — AB (ref 11.5–15.5)
WBC: 5.1 10*3/uL (ref 4.0–10.5)

## 2017-01-18 LAB — I-STAT BETA HCG BLOOD, ED (MC, WL, AP ONLY): I-stat hCG, quantitative: <5 m[IU]/mL (ref <5)

## 2017-01-18 LAB — ETHANOL: Alcohol, Ethyl (B): <5 mg/dL (ref <5)

## 2017-01-18 LAB — SALICYLATE LEVEL: Salicylate Lvl: <7 mg/dL (ref 2.8–30.0)

## 2017-01-18 MED ORDER — ONDANSETRON HCL 4 MG PO TABS: 4.0000 mg | ORAL_TABLET | Freq: Three times a day (TID) | ORAL | Status: DC | PRN

## 2017-01-18 MED ORDER — LORAZEPAM 1 MG PO TABS: 1.0000 mg | ORAL_TABLET | Freq: Three times a day (TID) | ORAL | Status: DC | PRN

## 2017-01-18 MED ORDER — ACETAMINOPHEN 325 MG PO TABS: 650.0000 mg | ORAL_TABLET | ORAL | Status: DC | PRN

## 2017-01-18 NOTE — Progress Notes (Addendum)
Pt confirms she does not have a pcp states her daughter goes to the [facility] children Pt confirms having medicaid and provided a list of [place] providers to assist with finding a pcp for follow up care  Entered in d/c instructions Please use the resources provided to you in emergency room by case manager to assist in your choice of doctor for follow up  These [employer] uninsured resources provide possible primary care providers

## 2017-01-18 NOTE — ED Notes (Signed)
Pt reminded that we need a urine sample.  

## 2017-01-18 NOTE — BH Assessment (Addendum)
Assessment Note  Deborah Melendez is an 41 y.o. female that presents this date with thoughts of self harm with a plan to run into traffic. Patient is oriented to time/place and denies any H/I or AVH. Patient stated this date that she has not been on her medications "for months" and "gets like this" every few months. Patient denies any OP provider but has multiple admissions inpatient with the last one on 09/07/16 when patient presented with thoughts of self harm and depression. Patient states she has been diagnosed with MDD "years ago" but fails to follow up with after care due to patient stating she doesn't have transportation. Patient is vague in reference to history and symptoms but reports she is suffering from ongoing depression with symptoms to include: hopelessness, guilt and isolating. Patient reports daily crack cocaine use reporting using 1 gram or more "when she can get it." Patient reports last use on 01/17/17 reporting she used a "unknown amount." Patient reports one prior attempt at self harm but cannot remember when she actually attempted to "go through with it."Patient denies any other current SA use and denies withdrawals at this time. Patient reports worsening thoughts of depression, suicidal ideation and suicidal thoughts with a plan to run into traffic. Patent is requesting an inpatient admission to assist with stabilization and "detox." Patient stated she is open to medication management but feels they "don't help." Patient denies any current legal or thoughts of harming others. Case was staffed with Reita Cliche DNP who recommended patient for Observation Unit.   Diagnosis: MDD recurrent without psychotic features, moderate Cocaine abuse severe  Past Medical History:  Past Medical History:  Diagnosis Date  . MRSA (methicillin resistant Staphylococcus aureus)    surgery on finger 3 years ago  . Substance or medication-induced bipolar and related disorder with onset during intoxication ([place])  09/08/2016    Past Surgical History:  Procedure Laterality Date  . FINGER SURGERY      Family History:  Family History  Problem Relation Age of Onset  . Diabetes Mother   . Hypertension Mother     Social History:  reports that she has been smoking Cigarettes.  She has been smoking about 0.50 packs per day. She has never used smokeless tobacco. She reports that she drinks alcohol. She reports that she does not use drugs.  Additional Social History:  Alcohol / Drug Use Pain Medications: denies Prescriptions: denies Over the Counter: denies History of alcohol / drug use?: Yes Longest period of sobriety (when/how long): Unknown Negative Consequences of Use:  (Unknown) Withdrawal Symptoms:  (Denies) Substance #1 Name of Substance 1: Cocaine 1 - Age of First Use: Unknown 1 - Amount (size/oz): Unknown 1 - Frequency: Unknown 1 - Duration: Unknown 1 - Last Use / Amount: Unknown  CIWA: CIWA-Ar BP: 126/89 Pulse Rate: 91 COWS:    Allergies: No Known Allergies  Home Medications:  (Not in a hospital admission)  OB/GYN Status:  Patient's last menstrual period was 01/17/2017.  General Assessment Data [facility] of Assessment: [facility] TTS Assessment: In system Is this a Tele or Face-to-Face Assessment?: Face-to-Face Is this an Initial Assessment or a Re-assessment for this encounter?: Initial Assessment Marital status: Single Maiden name: na Is patient pregnant?: No Pregnancy Status: No Living Arrangements: Non-relatives/Friends Can pt return to current living arrangement?: Yes Admission Status: Voluntary Is patient capable of signing voluntary admission?: Yes Referral Source: Self/Family/Friend Insurance type: Self pay  Medical Screening Exam ([place]) Medical Exam completed: Yes  Crisis Care  Plan Living Arrangements: Non-relatives/Friends Legal Guardian:  (na) Name of Psychiatrist: None Name of Therapist: None  Education Status Is patient currently in  school?: No Current Grade: na Highest grade of school patient has completed: 54 Name of school: na Contact person: na  Risk to self with the past 6 months Suicidal Ideation: Yes-Currently Present Has patient been a risk to self within the past 6 months prior to admission? : Yes Suicidal Intent: Yes-Currently Present Has patient had any suicidal intent within the past 6 months prior to admission? : Yes Is patient at risk for suicide?: Yes Suicidal Plan?: Yes-Currently Present Has patient had any suicidal plan within the past 6 months prior to admission? : Yes Specify Current Suicidal Plan: run into traffic Access to Means: Yes Specify Access to Suicidal Means[place] What has been your use of drugs/alcohol within the last 12 months?: Current Previous Attempts/Gestures: Yes How many times?: 1 Other Self Harm Risks: na Triggers for Past Attempts: Unknown Intentional Self Injurious Behavior: None Family Suicide History: No Recent stressful life event(s): Other (Comment) (relationship issues) Persecutory voices/beliefs?: No Depression: Yes Depression Symptoms: Fatigue, Guilt Substance abuse history and/or treatment for substance abuse?: No Suicide prevention information given to non-admitted patients: Not applicable  Risk to Others within the past 6 months Homicidal Ideation: No-Not Currently/Within Last 6 Months Does patient have any lifetime risk of violence toward others beyond the six months prior to admission? : No Thoughts of Harm to Others: No Current Homicidal Intent: No Current Homicidal Plan: No Access to Homicidal Means: No Identified Victim:  (na) History of harm to others?: No Assessment of Violence: None Noted Violent Behavior Description:  (na) Does patient have access to weapons?: No Criminal Charges Pending?: No Does patient have a court date: No Is patient on probation?: No  Psychosis Hallucinations: None noted Delusions: None  noted  Mental Status Report Appearance/Hygiene: In scrubs Eye Contact: Poor Motor Activity: Unremarkable Speech: Soft, Slow Level of Consciousness: Drowsy Mood: Depressed Affect: Depressed Anxiety Level: Minimal Thought Processes: Coherent, Relevant Judgement: Unimpaired Orientation: Person, Place, Time Obsessive Compulsive Thoughts/Behaviors: None  Cognitive Functioning Concentration: Decreased Memory: Recent Intact IQ: Average Insight: Poor Impulse Control: Poor Appetite: Fair Weight Loss: 0 Weight Gain: 0 Sleep: Decreased Total Hours of Sleep: 5 Vegetative Symptoms: None  ADLScreening [facility] Assessment Services) Patient's cognitive ability adequate to safely complete daily activities?: Yes Patient able to express need for assistance with ADLs?: Yes Independently performs ADLs?: Yes (appropriate for developmental age)  Prior Inpatient Therapy Prior Inpatient Therapy: Yes Prior Therapy Dates: 2017 Prior Therapy Facilty/Provider(s): [facility] Reason for Treatment: S/I, MH issues  Prior Outpatient Therapy Prior Outpatient Therapy: No Prior Therapy Dates: na Prior Therapy Facilty/Provider(s): na Reason for Treatment: na Does patient have an ACCT team?: No Does patient have Intensive In-House Services?  : No Does patient have [facility] services? : No Does patient have P4CC services?: No  ADL Screening (condition at time of admission) Patient's cognitive ability adequate to safely complete daily activities?: Yes Is the patient deaf or have difficulty hearing?: No Does the patient have difficulty seeing, even when wearing glasses/contacts?: No Does the patient have difficulty concentrating, remembering, or making decisions?: No Patient able to express need for assistance with ADLs?: Yes Does the patient have difficulty dressing or bathing?: No Independently performs ADLs?: Yes (appropriate for developmental age) Does the patient have difficulty walking or climbing stairs?:  No Weakness of Legs: None Weakness of Arms/Hands: None  Home Assistive Devices/Equipment Home Assistive  Devices/Equipment: None  Therapy Consults (therapy consults require a physician order) PT Evaluation Needed: No OT Evalulation Needed: No SLP Evaluation Needed: No Abuse/Neglect Assessment (Assessment to be complete while patient is alone) Physical Abuse: Denies Verbal Abuse: Denies Sexual Abuse: Denies Exploitation of patient/patient's resources: Denies Self-Neglect: Denies Values / Beliefs Cultural Requests During Hospitalization: None Spiritual Requests During Hospitalization: None Consults Spiritual Care Consult Needed: No Social Work Consult Needed: No Regulatory affairs officer (For Healthcare) Does Patient Have a Medical Advance Directive?: No Would patient like information on creating a medical advance directive?: No - Patient declined    Additional Information 1:1 In Past 12 Months?: No CIRT Risk: No Elopement Risk: No Does patient have medical clearance?: Yes     Disposition: Case was staffed with Reita Cliche DNP who recommended patient for Observation Unit.   Disposition Initial Assessment Completed for this Encounter: Yes Disposition of Patient: Other dispositions Other disposition(s): Other (Comment) (re-evaluate in the a.m.)  On Site Evaluation by:   Reviewed with Physician:    Mamie Nick 01/18/2017 1:54 PM

## 2017-01-18 NOTE — BH Assessment (Signed)
[place] Assessment Progress Note Case was staffed with Reita Cliche DNP who recommended patient for Observation Unit.

## 2017-01-18 NOTE — ED Notes (Signed)
Pt in scrubs, wanded by security, and meal tray given.

## 2017-01-18 NOTE — ED Notes (Signed)
Bed: WA30 Expected date:  Expected time:  Means of arrival:  Comments: TRIAGE 4 

## 2017-01-18 NOTE — ED Provider Notes (Signed)
[facility] DEPT [facility] Provider Note   CSN: [id number] Arrival date & time: 01/18/17  0931     History   Chief Complaint Chief Complaint  Patient presents with  . Suicidal    HPI Deborah Melendez is a 41 y.o. female.  HPI Patient with history of depression and substance abuse presents with increasing depressive and suicidal thoughts for the last 1-2 days. States she last used cocaine 2 days ago. She denies any specific plans. Denies homicidal ideation, hallucinations. Patient states he's been out of her medications for greater than a month. Past Medical History:  Diagnosis Date  . MRSA (methicillin resistant Staphylococcus aureus)    surgery on finger 3 years ago  . Substance or medication-induced bipolar and related disorder with onset during intoxication ([place]) 09/08/2016    Patient Active Problem List   Diagnosis Date Noted  . 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 Cannabis use disorder, severe, dependence ([place]) 01/20/2017  . MDD (major depressive disorder), recurrent severe, without psychosis ([place]) 08/08/2015  . Cocaine use disorder, severe, dependence ([place]) 08/08/2015  . Cigar smoker motivated to quit 07/06/2014    Past Surgical History:  Procedure Laterality Date  . FINGER SURGERY      OB History    Gravida Para Term Preterm AB Living   7 5       5    SAB TAB Ectopic Multiple Live Births           1       Home Medications    Prior to Admission medications   Medication Sig Start Date End Date Taking? Authorizing Provider  divalproex (DEPAKOTE) 250 MG DR tablet Take 1 tablet (250 mg total) by mouth every 8 (eight) hours. 09/10/16  Yes Benjamine Mola, FNP  gabapentin (NEURONTIN) 100 MG capsule Take 1 capsule (100 mg total) by mouth 3 (three) times daily. 09/10/16  Yes Benjamine Mola, FNP  hydrOXYzine (ATARAX/VISTARIL) 25 MG tablet Take 1 tablet (25 mg total) by mouth every 6 (six) hours. For  anxiety 09/10/16  Yes Benjamine Mola, FNP  QUEtiapine (SEROQUEL) 200 MG tablet Take 1 tablet (200 mg total) by mouth at bedtime. 09/10/16  Yes Benjamine Mola, FNP  traZODone (DESYREL) 100 MG tablet Take 1 tablet (100 mg total) by mouth at bedtime as needed for sleep. 09/10/16  Yes Benjamine Mola, FNP  nicotine (NICODERM CQ - DOSED IN MG/24 HOURS) 21 mg/24hr patch Place 1 patch (21 mg total) onto the skin daily. For smoking cessation Patient not taking: Reported on 01/18/2017 09/10/16   Benjamine Mola, FNP    Family History Family History  Problem Relation Age of Onset  . Diabetes Mother   . Hypertension Mother   . Drug abuse Father   . Schizophrenia Maternal Aunt     Social History Social History  Substance Use Topics  . Smoking status: Current Every Day Smoker    Packs/day: 0.50    Types: Cigarettes  . Smokeless tobacco: Never Used  . Alcohol use Yes     Comment: occasional      Allergies   Patient has no known allergies.   Review of Systems Review of Systems  Constitutional: Negative for appetite change, chills and fever.  HENT: Negative for facial swelling and sore throat.   Eyes: Negative for visual disturbance.  Respiratory: Negative for cough, chest tightness and shortness of breath.   Cardiovascular: Negative for chest pain, palpitations and leg swelling.  Gastrointestinal: Negative for abdominal pain, diarrhea, nausea and vomiting.  Neurological: Negative for dizziness, weakness, light-headedness, numbness and headaches.  Psychiatric/Behavioral: Positive for dysphoric mood, sleep disturbance and suicidal ideas. Negative for hallucinations. The patient is not hyperactive.   All other systems reviewed and are negative.    Physical Exam Updated Vital Signs BP 122/79 (BP Location: Left Arm)   Pulse 72   Temp 98.2 F (36.8 C) (Oral)   Resp 18   LMP 01/17/2017   SpO2 100%   Physical Exam  Constitutional: She is oriented to person, place, and time. She appears  well-developed and well-nourished. No distress.  Looks older than stated age  HENT:  Head: Normocephalic and atraumatic.  Mouth/Throat: Oropharynx is clear and moist.  Eyes: EOM are normal. Pupils are equal, round, and reactive to light.  Neck: Normal range of motion. Neck supple.  Cardiovascular: Normal rate and regular rhythm.  Exam reveals no gallop and no friction rub.   No murmur heard. Pulmonary/Chest: Effort normal and breath sounds normal. No respiratory distress. She has no wheezes. She has no rales. She exhibits no tenderness.  Abdominal: Soft. Bowel sounds are normal. There is no tenderness. There is no rebound and no guarding.  Musculoskeletal: Normal range of motion. She exhibits no edema or tenderness.  Neurological: She is alert and oriented to person, place, and time.  Moves all extremities without deficit. Sensation fully intact.  Skin: Skin is warm and dry. Capillary refill takes less than 2 seconds. No rash noted. She is not diaphoretic. No erythema.  Psychiatric: She has a normal mood and affect. Her behavior is normal.  Nursing note and vitals reviewed.    ED Treatments / Results  Labs (all labs ordered are listed, but only abnormal results are displayed) Labs Reviewed  COMPREHENSIVE METABOLIC PANEL - Abnormal; Notable for the following:       Result Value   Chloride 113 (*)    CO2 21 (*)    Calcium 8.5 (*)    Total Protein 6.4 (*)    ALT 12 (*)    All other components within normal limits  ACETAMINOPHEN LEVEL - Abnormal; Notable for the following:    Acetaminophen (Tylenol), Serum <10 (*)    All other components within normal limits  CBC - Abnormal; Notable for the following:    RBC 3.68 (*)    Hemoglobin 10.7 (*)    HCT 32.7 (*)    RDW 16.0 (*)    All other components within normal limits  RAPID URINE DRUG SCREEN, HOSP PERFORMED - Abnormal; Notable for the following:    Cocaine POSITIVE (*)    Tetrahydrocannabinol POSITIVE (*)    All other components  within normal limits  ETHANOL  SALICYLATE LEVEL  I-STAT BETA HCG BLOOD, ED ([facility], [facility], [facility] ONLY)    EKG  EKG Interpretation None       Radiology No results found.  Procedures Procedures (including critical care time)  Medications Ordered in ED Medications - No data to display   Initial Impression / Assessment and Plan / ED Course  I have reviewed the triage vital signs and the nursing notes.  Pertinent labs & imaging results that were available during my care of the patient were reviewed by me and considered in my medical decision making (see chart for details).    Patient is cleared medically for psychiatric evaluation.   Final Clinical Impressions(s) / ED Diagnoses   Final diagnoses:  Depression, unspecified depression type  Suicidal thoughts  New Prescriptions Discharge Medication List as of 01/19/2017  1:43 AM       Julianne Rice, MD 01/21/17 0001

## 2017-01-18 NOTE — ED Triage Notes (Signed)
Pt complaint of depression and SI without plan. Pt verbalizes "I get like this every few months when I get off my medicine, and I can't even take care of my kids."

## 2017-01-19 ENCOUNTER — Encounter (HOSPITAL_COMMUNITY): Payer: Self-pay | Admitting: Emergency Medicine

## 2017-01-19 ENCOUNTER — Inpatient Hospital Stay (HOSPITAL_COMMUNITY)
Admission: EM | Admit: 2017-01-19 | Discharge: 2017-01-25 | DRG: 885 | Disposition: A | Discharge status: Home / Self Care | Payer: Federal, State, Local not specified - Other | Source: Intra-hospital | Attending: Psychiatry | Admitting: Psychiatry

## 2017-01-19 DIAGNOSIS — F1994 Other psychoactive substance use, unspecified with psychoactive substance-induced mood disorder: Secondary | ICD-10-CM | POA: Diagnosis not present

## 2017-01-19 DIAGNOSIS — F1220 Cannabis dependence, uncomplicated: Secondary | ICD-10-CM | POA: Diagnosis present

## 2017-01-19 DIAGNOSIS — Z818 Family history of other mental and behavioral disorders: Secondary | ICD-10-CM

## 2017-01-19 DIAGNOSIS — Z8249 Family history of ischemic heart disease and other diseases of the circulatory system: Secondary | ICD-10-CM | POA: Diagnosis not present

## 2017-01-19 DIAGNOSIS — F17210 Nicotine dependence, cigarettes, uncomplicated: Secondary | ICD-10-CM | POA: Diagnosis present

## 2017-01-19 DIAGNOSIS — Z915 Personal history of self-harm: Secondary | ICD-10-CM | POA: Diagnosis not present

## 2017-01-19 DIAGNOSIS — Z833 Family history of diabetes mellitus: Secondary | ICD-10-CM

## 2017-01-19 DIAGNOSIS — G4700 Insomnia, unspecified: Secondary | ICD-10-CM | POA: Diagnosis present

## 2017-01-19 DIAGNOSIS — R45851 Suicidal ideations: Secondary | ICD-10-CM

## 2017-01-19 DIAGNOSIS — F19180 Other psychoactive substance abuse with psychoactive substance-induced anxiety disorder: Secondary | ICD-10-CM | POA: Diagnosis present

## 2017-01-19 DIAGNOSIS — F1490 Cocaine use, unspecified, uncomplicated: Secondary | ICD-10-CM | POA: Diagnosis present

## 2017-01-19 DIAGNOSIS — Z8614 Personal history of Methicillin resistant Staphylococcus aureus infection: Secondary | ICD-10-CM | POA: Diagnosis not present

## 2017-01-19 DIAGNOSIS — F410 Panic disorder [episodic paroxysmal anxiety] without agoraphobia: Secondary | ICD-10-CM | POA: Diagnosis not present

## 2017-01-19 DIAGNOSIS — F332 Major depressive disorder, recurrent severe without psychotic features: Secondary | ICD-10-CM | POA: Diagnosis present

## 2017-01-19 DIAGNOSIS — F19980 Other psychoactive substance use, unspecified with psychoactive substance-induced anxiety disorder: Secondary | ICD-10-CM | POA: Diagnosis not present

## 2017-01-19 DIAGNOSIS — Z9889 Other specified postprocedural states: Secondary | ICD-10-CM

## 2017-01-19 DIAGNOSIS — F1420 Cocaine dependence, uncomplicated: Secondary | ICD-10-CM | POA: Diagnosis present

## 2017-01-19 DIAGNOSIS — Z79899 Other long term (current) drug therapy: Secondary | ICD-10-CM

## 2017-01-19 MED ORDER — ACETAMINOPHEN 325 MG PO TABS
650.0000 mg | ORAL_TABLET | Freq: Four times a day (QID) | ORAL | Status: DC | PRN
  Administered 2017-01-19 – 2017-01-24 (×8): 650 mg via ORAL
  Filled 2017-01-19 (×10): qty 2

## 2017-01-19 MED ORDER — ALUM & MAG HYDROXIDE-SIMETH 200-200-20 MG/5ML PO SUSP: 30.0000 mL | ORAL | Status: DC | PRN

## 2017-01-19 MED ORDER — QUETIAPINE FUMARATE 200 MG PO TABS
200.0000 mg | ORAL_TABLET | Freq: Every day | ORAL | Status: DC
  Administered 2017-01-19 – 2017-01-20 (×2): 200 mg via ORAL
  Filled 2017-01-19 (×4): qty 1

## 2017-01-19 MED ORDER — DIVALPROEX SODIUM 250 MG PO DR TAB
250.0000 mg | DELAYED_RELEASE_TABLET | Freq: Two times a day (BID) | ORAL | Status: DC
  Administered 2017-01-19 – 2017-01-20 (×3): 250 mg via ORAL
  Filled 2017-01-19 (×8): qty 1

## 2017-01-19 MED ORDER — MAGNESIUM HYDROXIDE 400 MG/5ML PO SUSP: 30.0000 mL | Freq: Every day | ORAL | Status: DC | PRN

## 2017-01-19 MED ORDER — HYDROXYZINE HCL 25 MG PO TABS
25.0000 mg | ORAL_TABLET | Freq: Four times a day (QID) | ORAL | Status: DC | PRN
  Administered 2017-01-19 (×3): 25 mg via ORAL
  Filled 2017-01-19 (×3): qty 1

## 2017-01-19 MED ORDER — TRAZODONE HCL 100 MG PO TABS
100.0000 mg | ORAL_TABLET | Freq: Every evening | ORAL | Status: DC | PRN
  Administered 2017-01-19 – 2017-01-23 (×4): 100 mg via ORAL
  Filled 2017-01-19: qty 1
  Filled 2017-01-19: qty 7
  Filled 2017-01-19 (×3): qty 1

## 2017-01-19 NOTE — Progress Notes (Signed)
Patient ID: Deborah Melendez, female   DOB: 1976-01-29, 41 y.o.   MRN: [id number]  Patient arrived to unit with complaints of depression and passive suicidal ideations. Patient denies plans to harm herself and she verbally contracts for safety. Pt states she has been off of her medications for over a month and does not have a PCP. Pt is requesting to be admitted inpatient at this time. No s/s of distress noted at this time.

## 2017-01-19 NOTE — H&P (Signed)
[facility] OBS UNIT H&P  Patient Identification: Deborah Melendez MRN:  [id number] Date of Evaluation:  01/19/2017 Chief Complaint: Patient states " I feel overwhelmed and I'm using drugs and I want to kill myself;  I want to die."   Principal Diagnosis: Substance induced mood disorder ([place])   Diagnosis:   Patient Active Problem List   Diagnosis Date Noted  . Substance induced mood disorder ([place]) [F19.94] 01/19/2017    Priority: High  . MDD (major depressive disorder), recurrent severe, without psychosis ([place]) [F33.2] 08/08/2015    Priority: High  . Substance or medication-induced bipolar and related disorder with onset during intoxication ([place]) [F19.94] 09/08/2016  . Major depressive disorder, recurrent episode, moderate degree ([place]) [F33.1] 08/20/2016  . Cocaine use disorder, severe, dependence ([place]) [F14.20] 08/08/2015  . Cigar smoker motivated to quit [F17.290] 07/06/2014   Subjective/Objective: Pt seen and chart reviewed. Pt is alert/oriented x4, calm, cooperative, and appropriate to situation. Pt denies homicidal ideation and psychosis and does not appear to be responding to internal stimuli. Pt later stated during the interview that she was hearing voices and homicidal but then could not provide any specific information and was very vague.   HPI: I have reviewed and concur with HPI elements below, modified as follows: "Deborah Melendez is an 41 y.o. female that presents this date with thoughts of self harm with a plan to run into traffic. Patient is oriented to time/place and denies any H/I or AVH. Patient stated this date that she has not been on her medications "for months" and "gets like this" every few months. Patient denies any OP provider but has multiple admissions inpatient with the last one on 09/07/16 when patient presented with thoughts of self harm and depression. Patient states she has been diagnosed with MDD "years ago" but fails to follow up with after care due to patient stating  she doesn't have transportation. Patient is vague in reference to history and symptoms but reports she is suffering from ongoing depression with symptoms to include: hopelessness, guilt and isolating. Patient reports daily crack cocaine use reporting using 1 gram or more "when she can get it." Patient reports last use on 01/17/17 reporting she used a "unknown amount." Patient reports one prior attempt at self harm but cannot remember when she actually attempted to "go through with it."Patient denies any other current SA use and denies withdrawals at this time. Patient reports worsening thoughts of depression, suicidal ideation and suicidal thoughts with a plan to run into traffic. [facility] is requesting an inpatient admission to assist with stabilization and "detox." Patient stated she is open to medication management but feels they "don't help." Patient denies any current legal or thoughts of harming others. Case was staffed with Reita Cliche DNP who recommended patient for Observation Unit."  Pt arrived today on 01/19/2017 on [place] early this morning after midnight. She continues to report to staff that she is suicidal and will take her life.   Total Time spent with patient: 30 minutes  Past Psychiatric History: see above H&p.  Is the patient at risk to self? Yes.    Has the patient been a risk to self in the past 6 months? Yes.    Has the patient been a risk to self within the distant past? Yes.    Is the patient a risk to others? Yes.    Has the patient been a risk to others in the past 6 months? No.  Has the patient been a risk to others  within the distant past? No.   Prior Inpatient Therapy:  see above Prior Outpatient Therapy:  see above  Alcohol Screening:   Substance Abuse History in the last 12 months:  Yes.  cocaine , cannabis - see above  Consequences of Substance Abuse: Legal Consequences:  been to jail many times ondrug charges--last year had citation in [place] for possession og  drug paraphenelia- ( as per EHR) Previous Psychotropic Medications: Yes PROZAC, ELAVIL ( NONCOMPLIANT SINCE STOPPED WORKING) Psychological Evaluations: No  Past Medical History:  Past Medical History:  Diagnosis Date  . MRSA (methicillin resistant Staphylococcus aureus)    surgery on finger 3 years ago  . Substance or medication-induced bipolar and related disorder with onset during intoxication ([place]) 09/08/2016    Past Surgical History:  Procedure Laterality Date  . FINGER SURGERY     Family History:  Family History  Problem Relation Age of Onset  . Diabetes Mother   . Hypertension Mother    Family Psychiatric  History: Maternal Aunt has depression and has made suicide attempts.( PER EHR)  Tobacco Screening:   Social History: Patient is single , lives in [place] , has SSI . History  Alcohol Use  . Yes    Comment: occasional      History  Drug Use No    Comment: 30 days sober, just completed treatment at [facility] 8/11    Additional Social History:                           Allergies:  No Known Allergies Lab Results:  Results for orders placed or performed during the hospital encounter of 01/18/17 (from the past 48 hour(s))  Comprehensive metabolic panel     Status: Abnormal   Collection Time: 01/18/17 10:38 AM  Result Value Ref Range   Sodium 141 135 - 145 mmol/L   Potassium 3.7 3.5 - 5.1 mmol/L   Chloride 113 (H) 101 - 111 mmol/L   CO2 21 (L) 22 - 32 mmol/L   Glucose, Bld 98 65 - 99 mg/dL   BUN 16 6 - 20 mg/dL   Creatinine, Ser 0.81 0.44 - 1.00 mg/dL   Calcium 8.5 (L) 8.9 - 10.3 mg/dL   Total Protein 6.4 (L) 6.5 - 8.1 g/dL   Albumin 3.7 3.5 - 5.0 g/dL   AST 18 15 - 41 U/L   ALT 12 (L) 14 - 54 U/L   Alkaline Phosphatase 43 38 - 126 U/L   Total Bilirubin 0.8 0.3 - 1.2 mg/dL   GFR calc non Af Amer >60 >60 mL/min   GFR calc Af Amer >60 >60 mL/min    Comment: (NOTE) The eGFR has been calculated using the CKD EPI equation. This calculation has not been  validated in all clinical situations. eGFR's persistently <60 mL/min signify possible Chronic Kidney Disease.    Anion gap 7 5 - 15  Ethanol     Status: None   Collection Time: 01/18/17 10:38 AM  Result Value Ref Range   Alcohol, Ethyl (B) <5 <5 mg/dL    Comment:        LOWEST DETECTABLE LIMIT FOR SERUM ALCOHOL IS 5 mg/dL FOR MEDICAL PURPOSES ONLY   Salicylate level     Status: None   Collection Time: 01/18/17 10:38 AM  Result Value Ref Range   Salicylate Lvl <1.6 2.8 - 30.0 mg/dL  Acetaminophen level     Status: Abnormal   Collection Time: 01/18/17 10:38 AM  Result Value Ref Range   Acetaminophen (Tylenol), Serum <10 (L) 10 - 30 ug/mL    Comment:        THERAPEUTIC CONCENTRATIONS VARY SIGNIFICANTLY. A RANGE OF 10-30 ug/mL MAY BE AN EFFECTIVE CONCENTRATION FOR MANY PATIENTS. HOWEVER, SOME ARE BEST TREATED AT CONCENTRATIONS OUTSIDE THIS RANGE. ACETAMINOPHEN CONCENTRATIONS >150 ug/mL AT 4 HOURS AFTER INGESTION AND >50 ug/mL AT 12 HOURS AFTER INGESTION ARE OFTEN ASSOCIATED WITH TOXIC REACTIONS.   cbc     Status: Abnormal   Collection Time: 01/18/17 10:38 AM  Result Value Ref Range   WBC 5.1 4.0 - 10.5 K/uL   RBC 3.68 (L) 3.87 - 5.11 MIL/uL   Hemoglobin 10.7 (L) 12.0 - 15.0 g/dL   HCT 32.7 (L) 36.0 - 46.0 %   MCV 88.9 78.0 - 100.0 fL   MCH 29.1 26.0 - 34.0 pg   MCHC 32.7 30.0 - 36.0 g/dL   RDW 16.0 (H) 11.5 - 15.5 %   Platelets 230 150 - 400 K/uL  I-Stat Beta hCG blood, ED ([facility], [facility], [facility] only)     Status: None   Collection Time: 01/18/17 10:48 AM  Result Value Ref Range   I-stat hCG, quantitative <5.0 <5 mIU/mL   Comment 3            Comment:   GEST. AGE      CONC.  (mIU/mL)   <=1 WEEK        5 - 50     2 WEEKS       50 - 500     3 WEEKS       100 - 10,000     4 WEEKS     1,000 - 30,000        FEMALE AND NON-PREGNANT FEMALE:     LESS THAN 5 mIU/mL   Rapid urine drug screen (hospital performed)     Status: Abnormal   Collection Time: 01/18/17 12:54 PM  Result  Value Ref Range   Opiates NONE DETECTED NONE DETECTED   Cocaine POSITIVE (A) NONE DETECTED   Benzodiazepines NONE DETECTED NONE DETECTED   Amphetamines NONE DETECTED NONE DETECTED   Tetrahydrocannabinol POSITIVE (A) NONE DETECTED   Barbiturates NONE DETECTED NONE DETECTED    Comment:        DRUG SCREEN FOR MEDICAL PURPOSES ONLY.  IF CONFIRMATION IS NEEDED FOR ANY PURPOSE, NOTIFY LAB WITHIN 5 DAYS.        LOWEST DETECTABLE LIMITS FOR URINE DRUG SCREEN Drug Class       Cutoff (ng/mL) Amphetamine      1000 Barbiturate      200 Benzodiazepine   759 Tricyclics       163 Opiates          300 Cocaine          300 THC              50     Blood Alcohol level:  Lab Results  Component Value Date   ETH <5 01/18/2017   ETH <5 84/66/5993    Metabolic Disorder Labs:  No results found for: HGBA1C, MPG No results found for: PROLACTIN No results found for: CHOL, TRIG, HDL, CHOLHDL, VLDL, LDLCALC  Current Medications: Current Facility-Administered Medications  Medication Dose Route Frequency Provider Last Rate Last Dose  . acetaminophen (TYLENOL) tablet 650 mg  650 mg Oral Q6H PRN Laverle Hobby, PA-C      . alum & mag hydroxide-simeth (MAALOX/MYLANTA) 200-200-20 MG/5ML suspension 30 mL  30 mL Oral Q4H PRN Laverle Hobby, PA-C      . divalproex (DEPAKOTE) DR tablet 250 mg  250 mg Oral Q12H Laverle Hobby, PA-C   250 mg at 01/19/17 8127  . hydrOXYzine (ATARAX/VISTARIL) tablet 25 mg  25 mg Oral Q6H PRN Laverle Hobby, PA-C   25 mg at 01/19/17 5170  . magnesium hydroxide (MILK OF MAGNESIA) suspension 30 mL  30 mL Oral Daily PRN Laverle Hobby, PA-C      . traZODone (DESYREL) tablet 100 mg  100 mg Oral QHS PRN Laverle Hobby, PA-C       PTA Medications: Prescriptions Prior to Admission  Medication Sig Dispense Refill Last Dose  . divalproex (DEPAKOTE) 250 MG DR tablet Take 1 tablet (250 mg total) by mouth every 8 (eight) hours. 90 tablet 0 Past Month at Unknown time  . gabapentin  (NEURONTIN) 100 MG capsule Take 1 capsule (100 mg total) by mouth 3 (three) times daily. 90 capsule 0 Past Month at Unknown time  . hydrOXYzine (ATARAX/VISTARIL) 25 MG tablet Take 1 tablet (25 mg total) by mouth every 6 (six) hours. For anxiety 30 tablet 0 Past Month at Unknown time  . QUEtiapine (SEROQUEL) 200 MG tablet Take 1 tablet (200 mg total) by mouth at bedtime. 30 tablet 0 Past Month at Unknown time  . traZODone (DESYREL) 100 MG tablet Take 1 tablet (100 mg total) by mouth at bedtime as needed for sleep. 30 tablet 0 Past Month at Unknown time  . nicotine (NICODERM CQ - DOSED IN MG/24 HOURS) 21 mg/24hr patch Place 1 patch (21 mg total) onto the skin daily. For smoking cessation (Patient not taking: Reported on 01/18/2017) 28 patch 0 Not Taking at Unknown time    Musculoskeletal: Strength & Muscle Tone: within normal limits Gait & Station: normal Patient leans: N/A  Psychiatric Specialty Exam: Physical Exam  Nursing note and vitals reviewed. Constitutional:  I CONCUR WITH PE DONE IN ED    Review of Systems  Psychiatric/Behavioral: Positive for depression, hallucinations, substance abuse and suicidal ideas. The patient is nervous/anxious and has insomnia.   All other systems reviewed and are negative.   Blood pressure 125/89, pulse 80, resp. rate 16, height 5' 3"  (1.6 m), weight 54.4 kg (120 lb), last menstrual period 01/17/2017, SpO2 99 %.Body mass index is 21.26 kg/m.  General Appearance: Disheveled  Eye Contact:  Fair  Speech:  Clear and Coherent and Normal Rate  Volume:  Normal  Mood:  Angry, Anxious, Depressed and Irritable  Affect:  Congruent and Restricted  Thought Process:  Coherent, Goal Directed and Descriptions of Associations: Circumstantial  Orientation:  Full (Time, Place, and Person)  Thought Content:  Focused on coming inpatient  Suicidal Thoughts:  Yes.  with intent/plan  Homicidal Thoughts:  No  Memory:  Immediate;   Fair Recent;   Fair Remote;   Fair   Judgement:  Poor  Insight:  Fair  Psychomotor Activity:  Normal  Concentration:  Concentration: Fair  Recall:  [employer] of Knowledge:  Fair  Language:  Fair  Akathisia:  No  Handed:  Right  AIMS (if indicated):     Assets:  Desire for Improvement Housing Social Support  ADL's:  Intact  Cognition:  WNL  Sleep:       Treatment Plan Summary:  Substance induced mood disorder ([place]) with hx of bipolar, unstable, warrants inpatient admission, managed as below:  -Continue Depakote 228m po q12h for mood stabilization -Continue Vistaril  15m po q6h prn anxiety -Continue Seroquel 2069mpo qhs for psychosis -continue Trazodone 10018mo qhs prn insomnia    WitBenjamine MolaNPNorth [id number]/20189:44 AM

## 2017-01-19 NOTE — Discharge Summary (Signed)
[facility] DISCHARGE SUMMARY *PT ADMITTED TO UNIT*  Patient Identification: Deborah Melendez MRN:  [id number] Date of Evaluation:  01/19/2017 Principal Diagnosis: Substance induced mood disorder ([place])   Diagnosis:   Patient Active Problem List   Diagnosis Date Noted  . Substance induced mood disorder ([place]) [F19.94] 01/19/2017    Priority: High  . MDD (major depressive disorder), recurrent severe, without psychosis ([place]) [F33.2] 08/08/2015    Priority: High  . Substance or medication-induced bipolar and related disorder with onset during intoxication ([place]) [F19.94] 09/08/2016  . Major depressive disorder, recurrent episode, moderate degree ([place]) [F33.1] 08/20/2016  . Cocaine use disorder, severe, dependence ([place]) [F14.20] 08/08/2015  . Cigar smoker motivated to quit [F17.290] 07/06/2014   Subjective/Objective: Pt seen and chart reviewed. Pt is alert/oriented x4, calm, cooperative, and appropriate to situation. Pt denies homicidal ideation and psychosis and does not appear to be responding to internal stimuli. Pt later stated during the interview that she was hearing voices and homicidal but then could not provide any specific information and was very vague.   *Discharge update. Pt continues to be suicidal with plans to harm herself and is hearing voices. Pt admitted to unit.   HPI: I have reviewed and concur with HPI elements below, modified as follows: "Deborah Melendez is an 41 y.o. female that presents this date with thoughts of self harm with a plan to run into traffic. Patient is oriented to time/place and denies any H/I or AVH. Patient stated this date that she has not been on her medications "for months" and "gets like this" every few months. Patient denies any OP provider but has multiple admissions inpatient with the last one on 09/07/16 when patient presented with thoughts of self harm and depression. Patient states she has been diagnosed with MDD "years ago" but fails to follow up  with after care due to patient stating she doesn't have transportation. Patient is vague in reference to history and symptoms but reports she is suffering from ongoing depression with symptoms to include: hopelessness, guilt and isolating. Patient reports daily crack cocaine use reporting using 1 gram or more "when she can get it." Patient reports last use on 01/17/17 reporting she used a "unknown amount." Patient reports one prior attempt at self harm but cannot remember when she actually attempted to "go through with it."Patient denies any other current SA use and denies withdrawals at this time. Patient reports worsening thoughts of depression, suicidal ideation and suicidal thoughts with a plan to run into traffic. [facility] is requesting an inpatient admission to assist with stabilization and "detox." Patient stated she is open to medication management but feels they "don't help." Patient denies any current legal or thoughts of harming others. Case was staffed with Reita Cliche DNP who recommended patient for Observation Unit."  Pt arrived today on 01/19/2017 on [place] early this morning after midnight. She continues to report to staff that she is suicidal and will take her life.   Total Time spent with patient: 30 minutes  Past Psychiatric History: see above H&p.  Is the patient at risk to self? Yes.    Has the patient been a risk to self in the past 6 months? Yes.    Has the patient been a risk to self within the distant past? Yes.    Is the patient a risk to others? Yes.    Has the patient been a risk to others in the past 6 months? No.  Has the patient been a risk  to others within the distant past? No.   Prior Inpatient Therapy:  see above Prior Outpatient Therapy:  see above  Alcohol Screening:   Substance Abuse History in the last 12 months:  Yes.  cocaine , cannabis - see above  Consequences of Substance Abuse: Legal Consequences:  been to jail many times ondrug charges--last year had  citation in [place] for possession og drug paraphenelia- ( as per EHR) Previous Psychotropic Medications: Yes PROZAC, ELAVIL ( NONCOMPLIANT SINCE STOPPED WORKING) Psychological Evaluations: No  Past Medical History:  Past Medical History:  Diagnosis Date  . MRSA (methicillin resistant Staphylococcus aureus)    surgery on finger 3 years ago  . Substance or medication-induced bipolar and related disorder with onset during intoxication ([place]) 09/08/2016    Past Surgical History:  Procedure Laterality Date  . FINGER SURGERY     Family History:  Family History  Problem Relation Age of Onset  . Diabetes Mother   . Hypertension Mother    Family Psychiatric  History: Maternal Aunt has depression and has made suicide attempts.( PER EHR)  Tobacco Screening:   Social History: Patient is single , lives in [place] , has SSI . History  Alcohol Use  . Yes    Comment: occasional      History  Drug Use No    Comment: 30 days sober, just completed treatment at [facility] 8/11    Additional Social History:                           Allergies:  No Known Allergies Lab Results:  Results for orders placed or performed during the hospital encounter of 01/18/17 (from the past 48 hour(s))  Comprehensive metabolic panel     Status: Abnormal   Collection Time: 01/18/17 10:38 AM  Result Value Ref Range   Sodium 141 135 - 145 mmol/L   Potassium 3.7 3.5 - 5.1 mmol/L   Chloride 113 (H) 101 - 111 mmol/L   CO2 21 (L) 22 - 32 mmol/L   Glucose, Bld 98 65 - 99 mg/dL   BUN 16 6 - 20 mg/dL   Creatinine, Ser 0.81 0.44 - 1.00 mg/dL   Calcium 8.5 (L) 8.9 - 10.3 mg/dL   Total Protein 6.4 (L) 6.5 - 8.1 g/dL   Albumin 3.7 3.5 - 5.0 g/dL   AST 18 15 - 41 U/L   ALT 12 (L) 14 - 54 U/L   Alkaline Phosphatase 43 38 - 126 U/L   Total Bilirubin 0.8 0.3 - 1.2 mg/dL   GFR calc non Af Amer >60 >60 mL/min   GFR calc Af Amer >60 >60 mL/min    Comment: (NOTE) The eGFR has been calculated using the CKD EPI  equation. This calculation has not been validated in all clinical situations. eGFR's persistently <60 mL/min signify possible Chronic Kidney Disease.    Anion gap 7 5 - 15  Ethanol     Status: None   Collection Time: 01/18/17 10:38 AM  Result Value Ref Range   Alcohol, Ethyl (B) <5 <5 mg/dL    Comment:        LOWEST DETECTABLE LIMIT FOR SERUM ALCOHOL IS 5 mg/dL FOR MEDICAL PURPOSES ONLY   Salicylate level     Status: None   Collection Time: 01/18/17 10:38 AM  Result Value Ref Range   Salicylate Lvl <3.9 2.8 - 30.0 mg/dL  Acetaminophen level     Status: Abnormal   Collection Time: 01/18/17 10:38  AM  Result Value Ref Range   Acetaminophen (Tylenol), Serum <10 (L) 10 - 30 ug/mL    Comment:        THERAPEUTIC CONCENTRATIONS VARY SIGNIFICANTLY. A RANGE OF 10-30 ug/mL MAY BE AN EFFECTIVE CONCENTRATION FOR MANY PATIENTS. HOWEVER, SOME ARE BEST TREATED AT CONCENTRATIONS OUTSIDE THIS RANGE. ACETAMINOPHEN CONCENTRATIONS >150 ug/mL AT 4 HOURS AFTER INGESTION AND >50 ug/mL AT 12 HOURS AFTER INGESTION ARE OFTEN ASSOCIATED WITH TOXIC REACTIONS.   cbc     Status: Abnormal   Collection Time: 01/18/17 10:38 AM  Result Value Ref Range   WBC 5.1 4.0 - 10.5 K/uL   RBC 3.68 (L) 3.87 - 5.11 MIL/uL   Hemoglobin 10.7 (L) 12.0 - 15.0 g/dL   HCT 32.7 (L) 36.0 - 46.0 %   MCV 88.9 78.0 - 100.0 fL   MCH 29.1 26.0 - 34.0 pg   MCHC 32.7 30.0 - 36.0 g/dL   RDW 16.0 (H) 11.5 - 15.5 %   Platelets 230 150 - 400 K/uL  I-Stat Beta hCG blood, ED ([facility], [facility], [facility] only)     Status: None   Collection Time: 01/18/17 10:48 AM  Result Value Ref Range   I-stat hCG, quantitative <5.0 <5 mIU/mL   Comment 3            Comment:   GEST. AGE      CONC.  (mIU/mL)   <=1 WEEK        5 - 50     2 WEEKS       50 - 500     3 WEEKS       100 - 10,000     4 WEEKS     1,000 - 30,000        FEMALE AND NON-PREGNANT FEMALE:     LESS THAN 5 mIU/mL   Rapid urine drug screen (hospital performed)     Status: Abnormal    Collection Time: 01/18/17 12:54 PM  Result Value Ref Range   Opiates NONE DETECTED NONE DETECTED   Cocaine POSITIVE (A) NONE DETECTED   Benzodiazepines NONE DETECTED NONE DETECTED   Amphetamines NONE DETECTED NONE DETECTED   Tetrahydrocannabinol POSITIVE (A) NONE DETECTED   Barbiturates NONE DETECTED NONE DETECTED    Comment:        DRUG SCREEN FOR MEDICAL PURPOSES ONLY.  IF CONFIRMATION IS NEEDED FOR ANY PURPOSE, NOTIFY LAB WITHIN 5 DAYS.        LOWEST DETECTABLE LIMITS FOR URINE DRUG SCREEN Drug Class       Cutoff (ng/mL) Amphetamine      1000 Barbiturate      200 Benzodiazepine   449 Tricyclics       201 Opiates          300 Cocaine          300 THC              50     Blood Alcohol level:  Lab Results  Component Value Date   ETH <5 01/18/2017   ETH <5 00/71/2197    Metabolic Disorder Labs:  No results found for: HGBA1C, MPG No results found for: PROLACTIN No results found for: CHOL, TRIG, HDL, CHOLHDL, VLDL, LDLCALC  Current Medications: Current Facility-Administered Medications  Medication Dose Route Frequency Provider Last Rate Last Dose  . acetaminophen (TYLENOL) tablet 650 mg  650 mg Oral Q6H PRN Laverle Hobby, PA-C      . alum & mag hydroxide-simeth (MAALOX/MYLANTA) 200-200-20 MG/5ML suspension  30 mL  30 mL Oral Q4H PRN Laverle Hobby, PA-C      . divalproex (DEPAKOTE) DR tablet 250 mg  250 mg Oral Q12H Laverle Hobby, PA-C   250 mg at 01/19/17 7989  . hydrOXYzine (ATARAX/VISTARIL) tablet 25 mg  25 mg Oral Q6H PRN Laverle Hobby, PA-C   25 mg at 01/19/17 2119  . magnesium hydroxide (MILK OF MAGNESIA) suspension 30 mL  30 mL Oral Daily PRN Laverle Hobby, PA-C      . traZODone (DESYREL) tablet 100 mg  100 mg Oral QHS PRN Laverle Hobby, PA-C       PTA Medications: Prescriptions Prior to Admission  Medication Sig Dispense Refill Last Dose  . divalproex (DEPAKOTE) 250 MG DR tablet Take 1 tablet (250 mg total) by mouth every 8 (eight) hours. 90 tablet 0  Past Month at Unknown time  . gabapentin (NEURONTIN) 100 MG capsule Take 1 capsule (100 mg total) by mouth 3 (three) times daily. 90 capsule 0 Past Month at Unknown time  . hydrOXYzine (ATARAX/VISTARIL) 25 MG tablet Take 1 tablet (25 mg total) by mouth every 6 (six) hours. For anxiety 30 tablet 0 Past Month at Unknown time  . QUEtiapine (SEROQUEL) 200 MG tablet Take 1 tablet (200 mg total) by mouth at bedtime. 30 tablet 0 Past Month at Unknown time  . traZODone (DESYREL) 100 MG tablet Take 1 tablet (100 mg total) by mouth at bedtime as needed for sleep. 30 tablet 0 Past Month at Unknown time  . nicotine (NICODERM CQ - DOSED IN MG/24 HOURS) 21 mg/24hr patch Place 1 patch (21 mg total) onto the skin daily. For smoking cessation (Patient not taking: Reported on 01/18/2017) 28 patch 0 Not Taking at Unknown time    Musculoskeletal: Strength & Muscle Tone: within normal limits Gait & Station: normal Patient leans: N/A  Psychiatric Specialty Exam: Physical Exam  Nursing note and vitals reviewed. Constitutional:  I CONCUR WITH PE DONE IN ED    Review of Systems  Psychiatric/Behavioral: Positive for depression, hallucinations, substance abuse and suicidal ideas. The patient is nervous/anxious and has insomnia.   All other systems reviewed and are negative.   Blood pressure 125/89, pulse 80, resp. rate 16, height 5' 3"  (1.6 m), weight 54.4 kg (120 lb), last menstrual period 01/17/2017, SpO2 99 %.Body mass index is 21.26 kg/m.  General Appearance: Disheveled  Eye Contact:  Fair  Speech:  Clear and Coherent and Normal Rate  Volume:  Normal  Mood:  Angry, Anxious, Depressed and Irritable  Affect:  Congruent and Restricted  Thought Process:  Coherent, Goal Directed and Descriptions of Associations: Circumstantial  Orientation:  Full (Time, Place, and Person)  Thought Content:  Focused on coming inpatient  Suicidal Thoughts:  Yes.  with intent/plan  Homicidal Thoughts:  No  Memory:  Immediate;    Fair Recent;   Fair Remote;   Fair  Judgement:  Poor  Insight:  Fair  Psychomotor Activity:  Normal  Concentration:  Concentration: Fair  Recall:  [employer] of Knowledge:  Fair  Language:  Fair  Akathisia:  No  Handed:  Right  AIMS (if indicated):     Assets:  Desire for Improvement Housing Social Support  ADL's:  Intact  Cognition:  WNL  Sleep:       Treatment Plan Summary:  Substance induced mood disorder ([place]) with hx of bipolar, unstable, warrants inpatient admission, managed as below:  -Continue Depakote 270m po q12h for mood  stabilization -Continue Vistaril 41m po q6h prn anxiety -Continue Seroquel 2025mpo qhs for psychosis -continue Trazodone 10068mo qhs prn insomnia  PLAN: ADMIT TO UNIT  WitBenjamine MolaNP 1/23/20189:44 AM

## 2017-01-19 NOTE — Progress Notes (Signed)
Patient admitted to the adult unit at this time. Passed to the oncoming nurse. Patient has no needs at this time

## 2017-01-19 NOTE — Progress Notes (Signed)
Pt received this am in a agitated guarded mood. Pt medication seeking asking this nurse during assessment, "what kind of meds can I get? I want all of it!"  Pt having breakfast and asking for more food. Endorsed passive SI with no plan. Denies HI/AVH. Asking about substance abuse treatment but pt agitated and selective about where she wants to go. Pt under continuous observation for safety.

## 2017-01-19 NOTE — Progress Notes (Signed)
Psychoeducational Group Note  Date:  01/19/2017 Time:  2223  Group Topic/Focus:  Relapse Prevention Planning:   The focus of this group is to define relapse and discuss the need for planning to combat relapse.  Participation Level: Did Not Attend  Participation Quality:  Not Applicable  Affect:  Not Applicable  Cognitive:  Not Applicable  Insight:  Not Applicable  Engagement in Group: Not Applicable  Additional Comments: The patient did not attend the evening A.A.meeting since she was asleep in her room.   Archie Balboa S 01/19/2017, 10:23 PM

## 2017-01-20 ENCOUNTER — Encounter (HOSPITAL_COMMUNITY): Payer: Self-pay | Admitting: Psychiatry

## 2017-01-20 DIAGNOSIS — F1220 Cannabis dependence, uncomplicated: Secondary | ICD-10-CM | POA: Clinically undetermined

## 2017-01-20 MED ORDER — DIVALPROEX SODIUM 250 MG PO DR TAB
250.0000 mg | DELAYED_RELEASE_TABLET | Freq: Three times a day (TID) | ORAL | Status: DC
  Administered 2017-01-20 – 2017-01-25 (×14): 250 mg via ORAL
  Filled 2017-01-20 (×2): qty 1
  Filled 2017-01-20: qty 21
  Filled 2017-01-20 (×12): qty 1
  Filled 2017-01-20: qty 21
  Filled 2017-01-20: qty 1
  Filled 2017-01-20: qty 21
  Filled 2017-01-20 (×4): qty 1

## 2017-01-20 MED ORDER — GABAPENTIN 300 MG PO CAPS
300.0000 mg | ORAL_CAPSULE | Freq: Three times a day (TID) | ORAL | Status: DC
  Administered 2017-01-20 – 2017-01-21 (×3): 300 mg via ORAL
  Filled 2017-01-20 (×6): qty 1

## 2017-01-20 MED ORDER — QUETIAPINE FUMARATE 50 MG PO TABS
50.0000 mg | ORAL_TABLET | Freq: Three times a day (TID) | ORAL | Status: DC | PRN
  Administered 2017-01-21: 50 mg via ORAL
  Filled 2017-01-20: qty 1

## 2017-01-20 MED ORDER — METHOCARBAMOL 500 MG PO TABS
500.0000 mg | ORAL_TABLET | Freq: Four times a day (QID) | ORAL | Status: DC | PRN
  Administered 2017-01-22 – 2017-01-23 (×2): 500 mg via ORAL
  Filled 2017-01-20 (×2): qty 1

## 2017-01-20 MED ORDER — HYDROXYZINE HCL 50 MG PO TABS
50.0000 mg | ORAL_TABLET | Freq: Four times a day (QID) | ORAL | Status: DC | PRN
  Administered 2017-01-20 – 2017-01-24 (×6): 50 mg via ORAL
  Filled 2017-01-20 (×3): qty 1
  Filled 2017-01-20: qty 10
  Filled 2017-01-20 (×3): qty 1

## 2017-01-20 MED ORDER — CITALOPRAM HYDROBROMIDE 10 MG PO TABS
10.0000 mg | ORAL_TABLET | Freq: Every day | ORAL | Status: DC
  Administered 2017-01-20 – 2017-01-21 (×2): 10 mg via ORAL
  Filled 2017-01-20 (×3): qty 1
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
## 2017-01-20 NOTE — Progress Notes (Signed)
Patient ID: Deborah Melendez, female   DOB: Apr 13, 1976, 41 y.o.   MRN: [id number] D:Affect is sad/flat,mood is depressed but also seems easily irritated as she was having some difficulty getting up this AM and taking scheduled meds. Minimal participation thus far today but is appropriate with interactions when engaged in the milieu. A:Support and encouragement offered. R:Receptive. No complaints of pain or problems at this time.

## 2017-01-20 NOTE — BHH Group Notes (Signed)
[facility] LCSW Group Therapy  01/20/2017 3:39 PM  Type of Therapy:  Group Therapy  Participation Level:  Did Not Attend-pt in room resting. Chose to remain in bed.   Summary of Progress/Problems: Emotion Regulation: This group focused on both positive and negative emotion identification and allowed group members to process ways to identify feelings, regulate negative emotions, and find healthy ways to manage internal/external emotions. Group members were asked to reflect on a time when their reaction to an emotion led to a negative outcome and explored how alternative responses using emotion regulation would have benefited them. Group members were also asked to discuss a time when emotion regulation was utilized when a negative emotion was experienced.   [name] LCSW 01/20/2017, 3:39 PM

## 2017-01-20 NOTE — Plan of Care (Signed)
Problem: Activity: Goal: Sleeping patterns will improve Outcome: Progressing Patient is resting in bed without complaints. Respirations even and unlabored.  Problem: Safety: Goal: Periods of time without injury will increase Outcome: Progressing Patient remains safe on the unit. Patient contracts for safety and is on q15 min safety checks.

## 2017-01-20 NOTE — Progress Notes (Signed)
Patient observed by MHT and charge nurse hanging up the telephone and throwing her juice cup and napkin on the floor. Patient observed kicking juice cup and going to her room. MHT reported incident to Probation officer. Writer then went to patient and asked if she was upset. Patient denied being angry after phone call. Writer asked patient to clean up trash and reminded to follow unit rules. Patient began to raise her voice at staff stating "I am 41 and I won't be talked to like a child. I won't be accused of something I didn't do". Patient went to the dayroom and raised her voice at peers. MHT cleaned up trash. Patient asked to return to room and lower voice. Patient continued to yell but returned to room. Patient requested something for anxiety. PRN vistaril given by Tyrell Antonio. Patient is resting in bed now.

## 2017-01-20 NOTE — [facility] Suicide Risk Assessment (Signed)
[facility] Admission Suicide Risk Assessment   Nursing information obtained from:  Patient Demographic factors:  Unemployed Current Mental Status:  Suicidal ideation indicated by patient Loss Factors:  NA Historical Factors:  NA Risk Reduction Factors:  Sense of responsibility to family, Positive therapeutic relationship, Living with another person, especially a relative  Total Time spent with patient: 30 minutes Principal Problem: MDD (major depressive disorder), recurrent severe, without psychosis ([place]) Diagnosis:   Patient Active Problem List   Diagnosis Date Noted  . 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 Cannabis use disorder, severe, dependence ([place]) [F12.20] 01/20/2017  . MDD (major depressive disorder), recurrent severe, without psychosis ([place]) [F33.2] 08/08/2015  . Cocaine use disorder, severe, dependence ([place]) [F14.20] 08/08/2015  . Cigar smoker motivated to quit [F17.290] 07/06/2014   Subjective Data: Patient reports " I am anxious , restless, Suicidal ideation comes in waves - comes and goes , I have a lot of rage , I am unable to stay still. I only used cocaine and cannabis - have been using for several years - cocaine - 2 gms- once a week  - last use 4 days ago, Cannabis - every other day since past several years - 2 joints or more. Pt reports possible bipolar sx- but discussed its unable to come up with a primary psychopathology when she has been abusing all these substances .  Continued Clinical Symptoms:  Alcohol Use Disorder Identification Test Final Score (AUDIT): 12 The "Alcohol Use Disorders Identification Test", Guidelines for Use in Primary Care, Second Edition.  World Pharmacologist [facility]). Score between 0-7:  no or low risk or alcohol related problems. Score between 8-15:  moderate risk of alcohol related problems. Score between 16-19:  high risk of alcohol related problems. Score 20 or  above:  warrants further diagnostic evaluation for alcohol dependence and treatment.   CLINICAL FACTORS:   Severe Anxiety and/or Agitation Alcohol/Substance Abuse/Dependencies Unstable or Poor Therapeutic Relationship Previous Psychiatric Diagnoses and Treatments   Musculoskeletal: Strength & Muscle Tone: within normal limits Gait & Station: normal Patient leans: N/A  Psychiatric Specialty Exam: Physical Exam  Review of Systems  Psychiatric/Behavioral: Positive for depression, substance abuse and suicidal ideas. The patient is nervous/anxious.   All other systems reviewed and are negative.   Blood pressure 112/86, pulse 85, temperature 97.6 F (36.4 C), temperature source Oral, resp. rate 16, height 5\' 3"  (1.6 m), weight 54.4 kg (120 lb), last menstrual period 01/17/2017, SpO2 99 %.Body mass index is 21.26 kg/m.  General Appearance: Casual  Eye Contact:  Fair  Speech:  Normal Rate  Volume:  Increased  Mood:  Anxious, Dysphoric and Irritable  Affect:  Congruent  Thought Process:  Goal Directed and Descriptions of Associations: Circumstantial  Orientation:  Full (Time, Place, and Person)  Thought Content:  Rumination  Suicidal Thoughts:  Yes.  without intent/plan  Homicidal Thoughts:  No  Memory:  Immediate;   Fair Recent;   Fair Remote;   Fair  Judgement:  Impaired  Insight:  Shallow  Psychomotor Activity:  EPS and Restlessness  Concentration:  Concentration: Fair and Attention Span: Fair  Recall:  [employer] of Knowledge:  Fair  Language:  Fair  Akathisia:  No  Handed:  Right  AIMS (if indicated):     Assets:  Desire for Improvement  ADL's:  Intact  Cognition:  WNL  Sleep:  Number of Hours: 6.5      COGNITIVE FEATURES THAT CONTRIBUTE TO RISK:  Closed-mindedness, Polarized thinking and Thought constriction (tunnel vision)    SUICIDE RISK:   Moderate:  Frequent suicidal ideation with limited intensity, and duration, some specificity in terms of plans, no  associated intent, good self-control, limited dysphoria/symptomatology, some risk factors present, and identifiable protective factors, including available and accessible social support.  PLAN OF CARE: please see H&P for plan. Will continue Depakote 250 mg - change to q8 h. Depakote level in 5 days. Will continue Seroquel 300 mg po qhs , seroquel 50 mg po tid prn. Will add Celexa 10 mg po daily for affective sx.  I certify that inpatient services furnished can reasonably be expected to improve the patient's condition.   Cherylanne Ardelean, MD 01/20/2017, 3:18 PM

## 2017-01-20 NOTE — Progress Notes (Signed)
Recreation Therapy Notes  Date: 01/20/17 Time: 0930 Location: 300 Hall Dayroom  Group Topic: Stress Management  Goal Area(s) Addresses:  Patient will verbalize importance of using healthy stress management.  Patient will identify positive emotions associated with healthy stress management.   Intervention: Stress Management  Activity :  Body Scan Meditation.  LRT introduced the stress management technique of meditation.  LRT played a meditation from the calm app that allowed patients the opportunity to focus on any tension they may be experiencing in their bodies.  Patients were to follow along with the meditation.  Education:  Stress Management, Discharge Planning.   Education Outcome: Acknowledges edcuation/In group clarification offered/Needs additional education  Clinical Observations/Feedback: Pt did not attend group.    Victorino Sparrow, LRT/CTRS         Victorino Sparrow A 01/20/2017 2:44 PM

## 2017-01-20 NOTE — Tx Team (Signed)
Initial Treatment Plan 01/20/2017 7:31 AM Deborah Melendez [id number]    PATIENT STRESSORS: Financial difficulties Health problems Medication change or noncompliance Substance abuse   PATIENT STRENGTHS: Average or above average intelligence Capable of independent living Communication skills   PATIENT IDENTIFIED PROBLEMS: "my attitude"  "my substance abuse"  Depression  Suicide Risk  Substance Abuse             DISCHARGE CRITERIA:  Ability to meet basic life and health needs Adequate post-discharge living arrangements Improved stabilization in mood, thinking, and/or behavior Medical problems require only outpatient monitoring Motivation to continue treatment in a less acute level of care Need for constant or close observation no longer present Reduction of life-threatening or endangering symptoms to within safe limits Safe-care adequate arrangements made Verbal commitment to aftercare and medication compliance Withdrawal symptoms are absent or subacute and managed without 24-hour nursing intervention  PRELIMINARY DISCHARGE PLAN: Outpatient therapy  PATIENT/FAMILY INVOLVEMENT: This treatment plan has been presented to and reviewed with the patient, Deborah Melendez.  The patient and family have been given the opportunity to ask questions and make suggestions.  Annia Friendly, RN 01/20/2017, 7:31 AM

## 2017-01-20 NOTE — Plan of Care (Signed)
Problem: Coping: Goal: Ability to verbalize frustrations and anger appropriately will improve Outcome: Not Progressing Patient inappropriately verbalizing frustrations. Patient raised voice at staff and peers tonight.  Problem: Safety: Goal: Periods of time without injury will increase Outcome: Progressing Patient remains safe on the unit at this time. Patient is on q15 min safety checks.

## 2017-01-20 NOTE — Tx Team (Signed)
Interdisciplinary Treatment and Diagnostic Plan Update  01/20/2017 Time of Session: 9:30AM Deborah Melendez MRN: [id number]  Principal Diagnosis: Substance induced mood disorder ([place])  Secondary Diagnoses: Principal Problem:   Substance induced mood disorder ([place]) Active Problems:   MDD (major depressive disorder), recurrent severe, without psychosis ([place])   Current Medications:  Current Facility-Administered Medications  Medication Dose Route Frequency Provider Last Rate Last Dose  . acetaminophen (TYLENOL) tablet 650 mg  650 mg Oral Q6H PRN Laverle Hobby, PA-C   650 mg at 01/19/17 1527  . alum & mag hydroxide-simeth (MAALOX/MYLANTA) 200-200-20 MG/5ML suspension 30 mL  30 mL Oral Q4H PRN Laverle Hobby, PA-C      . divalproex (DEPAKOTE) DR tablet 250 mg  250 mg Oral Q12H Laverle Hobby, PA-C   250 mg at 01/20/17 [id number]  . hydrOXYzine (ATARAX/VISTARIL) tablet 25 mg  25 mg Oral Q6H PRN Laverle Hobby, PA-C   25 mg at 01/19/17 2126  . magnesium hydroxide (MILK OF MAGNESIA) suspension 30 mL  30 mL Oral Daily PRN Laverle Hobby, PA-C      . QUEtiapine (SEROQUEL) tablet 200 mg  200 mg Oral QHS Benjamine Mola, FNP   200 mg at 01/19/17 2126  . traZODone (DESYREL) tablet 100 mg  100 mg Oral QHS PRN Laverle Hobby, PA-C   100 mg at 01/19/17 2126   PTA Medications: Prescriptions Prior to Admission  Medication Sig Dispense Refill Last Dose  . divalproex (DEPAKOTE) 250 MG DR tablet Take 1 tablet (250 mg total) by mouth every 8 (eight) hours. 90 tablet 0 Past Month at Unknown time  . gabapentin (NEURONTIN) 100 MG capsule Take 1 capsule (100 mg total) by mouth 3 (three) times daily. 90 capsule 0 Past Month at Unknown time  . hydrOXYzine (ATARAX/VISTARIL) 25 MG tablet Take 1 tablet (25 mg total) by mouth every 6 (six) hours. For anxiety 30 tablet 0 Past Month at Unknown time  . QUEtiapine (SEROQUEL) 200 MG tablet Take 1 tablet (200 mg total) by mouth at bedtime. 30 tablet 0 Past Month at Unknown  time  . traZODone (DESYREL) 100 MG tablet Take 1 tablet (100 mg total) by mouth at bedtime as needed for sleep. 30 tablet 0 Past Month at Unknown time  . nicotine (NICODERM CQ - DOSED IN MG/24 HOURS) 21 mg/24hr patch Place 1 patch (21 mg total) onto the skin daily. For smoking cessation (Patient not taking: Reported on 01/18/2017) 28 patch 0 Not Taking at Unknown time    Patient Stressors: Financial difficulties Health problems Medication change or noncompliance Substance abuse  Patient Strengths: Average or above average intelligence Capable of independent living Communication skills  Treatment Modalities: Medication Management, Group therapy, Case management,  1 to 1 session with clinician, Psychoeducation, Recreational therapy.   Physician Treatment Plan for Primary Diagnosis: Substance induced mood disorder ([place]) Long Term Goal(s):     Short Term Goals:    Medication Management: Evaluate patient's response, side effects, and tolerance of medication regimen.  Therapeutic Interventions: 1 to 1 sessions, Unit Group sessions and Medication administration.  Evaluation of Outcomes: Progressing  Physician Treatment Plan for Secondary Diagnosis: Principal Problem:   Substance induced mood disorder ([place]) Active Problems:   MDD (major depressive disorder), recurrent severe, without psychosis ([place])  Long Term Goal(s):     Short Term Goals:       Medication Management: Evaluate patient's response, side effects, and tolerance of medication regimen.  Therapeutic Interventions: 1 to 1 sessions, Unit  Group sessions and Medication administration.  Evaluation of Outcomes: Progressing   RN Treatment Plan for Primary Diagnosis: Substance induced mood disorder ([place]) Long Term Goal(s): Knowledge of disease and therapeutic regimen to maintain health will improve  Short Term Goals: Ability to remain free from injury will improve, Ability to participate in decision making will improve and  Ability to verbalize feelings will improve  Medication Management: RN will administer medications as ordered by provider, will assess and evaluate patient's response and provide education to patient for prescribed medication. RN will report any adverse and/or side effects to prescribing provider.  Therapeutic Interventions: 1 on 1 counseling sessions, Psychoeducation, Medication administration, Evaluate responses to treatment, Monitor vital signs and CBGs as ordered, Perform/monitor CIWA, COWS, AIMS and Fall Risk screenings as ordered, Perform wound care treatments as ordered.  Evaluation of Outcomes: Progressing   LCSW Treatment Plan for Primary Diagnosis: Substance induced mood disorder ([place]) Long Term Goal(s): Safe transition to appropriate next level of care at discharge, Engage patient in therapeutic group addressing interpersonal concerns.  Short Term Goals: Engage patient in aftercare planning with referrals and resources, Facilitate patient progression through stages of change regarding substance use diagnoses and concerns and Identify triggers associated with mental health/substance abuse issues  Therapeutic Interventions: Assess for all discharge needs, 1 to 1 time with Social worker, Explore available resources and support systems, Assess for adequacy in community support network, Educate family and significant other(s) on suicide prevention, Complete Psychosocial Assessment, Interpersonal group therapy.  Evaluation of Outcomes: Progressing   Progress in Treatment: Attending groups: No. New to unit. Continuing to assess.  Participating in groups: No. Taking medication as prescribed: Yes. Toleration medication: Yes. Family/Significant other contact made: No, will contact:  with family member if patient consents. Patient understands diagnosis: Yes. Discussing patient identified problems/goals with staff: Yes. Medical problems stabilized or resolved: Yes. Denies suicidal/homicidal  ideation: Yes. Issues/concerns per patient self-inventory: No. Other: n/a  New problem(s) identified: No, Describe:  n/a  New Short Term/Long Term Goal(s): detox; medication stabilization; development of comprehensive mental wellness/sobriety plan.   Discharge Plan or Barriers: CSW assessing for appropriate referrals. Pt was admitted 09/07/16 and 08/20/16 for similar issues. During last admission, pt was referred to [facility] for continued inpatient treatment. Pt reports medication noncompliance "for several months."   Reason for Continuation of Hospitalization: Depression Medication stabilization Withdrawal symptoms  Estimated Length of Stay: 3-5 days   Attendees: Patient: 01/20/2017 8:53 AM  Physician: Dr. Shea Evans MD 01/20/2017 8:53 AM  Nursing: Thornton Dales RN 01/20/2017 8:53 AM  RN Care Manager: Lars Pinks CM 01/20/2017 8:53 AM  Social Worker: Nira Conn Smart, LCSW; Adriana Reams LCSW 01/20/2017 8:53 AM  Recreational Therapist:  01/20/2017 8:53 AM  Other: Lindell Spar NP; Samuel Jester NP 01/20/2017 8:53 AM  Other:  01/20/2017 8:53 AM  Other: 01/20/2017 8:53 AM    Scribe for Treatment Team: [place], LCSW 01/20/2017 8:53 AM

## 2017-01-20 NOTE — Progress Notes (Signed)
Nursing Progress Note 7p-7a  D) Patient presents anxious and labile. Patient denies SI/HI/AVH or pain. Patient complains of insomnia and anxiety. Patient contracts for safety at this time.   A) Emotional support given. Patient medicated with PM orders as prescribed. Medications reviewed with patient. Patient on q15 min safety checks. Opportunities for questions or concerns presented to patient. Patient encouraged to continue to work on treatment goals.  R) Patient remains safe on the unit at this time. Patient is seen interacting with peers in dayroom. Will continue to monitor.

## 2017-01-20 NOTE — H&P (Signed)
Psychiatric Admission Assessment Adult  Patient Identification: Deborah Melendez MRN:  [id number] Date of Evaluation:  01/20/2017 Chief Complaint: Patient states " I am depressed and I am hearing voices asking me to kill myself.'  Principal Diagnosis: Substance induced mood disorder (HCC)(cocaine, cannabis)  Diagnosis:   Patient Active Problem List   Diagnosis Date Noted  . Substance induced mood disorder ([place]) [F19.94] 01/19/2017  . Substance or medication-induced bipolar and related disorder with onset during intoxication ([place]) [F19.94] 09/08/2016  . Major depressive disorder, recurrent episode, moderate degree ([place]) [F33.1] 08/20/2016  . MDD (major depressive disorder), recurrent severe, without psychosis ([place]) [F33.2] 08/08/2015  . Cocaine use disorder, severe, dependence ([place]) [F14.20] 08/08/2015  . Cigar smoker motivated to quit [F17.290] 07/06/2014   History of Present Illness: Deborah Melendez is an 41 y.o. female that presents this date with thoughts of self harm with a plan to run into traffic.  She reports having attempted one other time but did not go through with it.  Patient reports that she has not been on her medications "for months" and "gets like this" every few months. Patient denies any OP provider but has multiple admissions inpatient with the last one on 09/07/16 when patient presented with thoughts of self harm and depression. Patient states she has been diagnosed with MDD "years ago" but fails to follow up with after care due to patient stating she doesn't have transportation.  Patient reports daily crack cocaine use.    Associated Signs/Symptoms: Depression Symptoms:  depressed mood, anhedonia, insomnia, psychomotor agitation, recurrent thoughts of death, anxiety, weight loss, decreased appetite, (Hypo) Manic Symptoms: mood swings , elevated energy , sleep issues , talking lot and being impuslive  Anxiety Symptoms:  Excessive Worry, Psychotic Symptoms:  see  above PTSD Symptoms: NA Total Time spent with patient: 1 hour  Past Psychiatric History: see above H&p.  Is the patient at risk to self? Yes.    Has the patient been a risk to self in the past 6 months? Yes.    Has the patient been a risk to self within the distant past? Yes.    Is the patient a risk to others? Yes.    Has the patient been a risk to others in the past 6 months? No.  Has the patient been a risk to others within the distant past? No.   Prior Inpatient Therapy:  see above Prior Outpatient Therapy:  see above  Alcohol Screening: 1. How often do you have a drink containing alcohol?: 2 to 4 times a month 2. How many drinks containing alcohol do you have on a typical day when you are drinking?: 5 or 6 3. How often do you have six or more drinks on one occasion?: Never Preliminary Score: 2 4. How often during the last year have you found that you were not able to stop drinking once you had started?: Never 5. How often during the last year have you failed to do what was normally expected from you becasue of drinking?: Never 6. How often during the last year have you needed a first drink in the morning to get yourself going after a heavy drinking session?: Never 7. How often during the last year have you had a feeling of guilt of remorse after drinking?: Daily or almost daily 8. How often during the last year have you been unable to remember what happened the night before because you had been drinking?: Never 9. Have you or someone else been injured as a result  of your drinking?: No 10. Has a relative or friend or a doctor or another health worker been concerned about your drinking or suggested you cut down?: Yes, during the last year Alcohol Use Disorder Identification Test Final Score (AUDIT): 12 Brief Intervention: Patient declined brief intervention Substance Abuse History in the last 12 months:  Yes.  cocaine , cannabis - see above  Consequences of Substance Abuse: Legal  Consequences:  been to jail many times ondrug charges--last year had citation in [place] for possession og drug paraphenelia- ( as per EHR) Previous Psychotropic Medications: Yes PROZAC, ELAVIL ( NONCOMPLIANT SINCE STOPPED WORKING) Psychological Evaluations: No  Past Medical History:  Past Medical History:  Diagnosis Date  . MRSA (methicillin resistant Staphylococcus aureus)    surgery on finger 3 years ago  . Substance or medication-induced bipolar and related disorder with onset during intoxication ([place]) 09/08/2016    Past Surgical History:  Procedure Laterality Date  . FINGER SURGERY     Family History:  Family History  Problem Relation Age of Onset  . Diabetes Mother   . Hypertension Mother    Family Psychiatric  History: Maternal Aunt has depression and has made suicide attempts.( PER EHR)  Tobacco Screening: Have you used any form of tobacco in the last 30 days? (Cigarettes, Smokeless Tobacco, Cigars, and/or Pipes): Yes Tobacco use, Select all that apply: 5 or more cigarettes per day Are you interested in Tobacco Cessation Medications?: Yes, will notify MD for an order Counseled patient on smoking cessation including recognizing danger situations, developing coping skills and basic information about quitting provided: Refused/Declined practical counseling Social History: Patient is single , lives in [place] , has SSI . History  Alcohol Use  . Yes    Comment: occasional      History  Drug Use No    Comment: 30 days sober, just completed treatment at [facility] 8/11    Additional Social History:                           Allergies:  No Known Allergies Lab Results:  Results for orders placed or performed during the hospital encounter of 01/18/17 (from the past 48 hour(s))  Rapid urine drug screen (hospital performed)     Status: Abnormal   Collection Time: 01/18/17 12:54 PM  Result Value Ref Range   Opiates NONE DETECTED NONE DETECTED   Cocaine POSITIVE (A) NONE  DETECTED   Benzodiazepines NONE DETECTED NONE DETECTED   Amphetamines NONE DETECTED NONE DETECTED   Tetrahydrocannabinol POSITIVE (A) NONE DETECTED   Barbiturates NONE DETECTED NONE DETECTED    Comment:        DRUG SCREEN FOR MEDICAL PURPOSES ONLY.  IF CONFIRMATION IS NEEDED FOR ANY PURPOSE, NOTIFY LAB WITHIN 5 DAYS.        LOWEST DETECTABLE LIMITS FOR URINE DRUG SCREEN Drug Class       Cutoff (ng/mL) Amphetamine      1000 Barbiturate      200 Benzodiazepine   [id number] Tricyclics       [id number] Opiates          300 Cocaine          300 THC              50     Blood Alcohol level:  Lab Results  Component Value Date   ETH <5 01/18/2017   ETH <5 [id number]    Metabolic Disorder Labs:  No results found for:  HGBA1C, MPG No results found for: PROLACTIN No results found for: CHOL, TRIG, HDL, CHOLHDL, VLDL, LDLCALC  Current Medications: Current Facility-Administered Medications  Medication Dose Route Frequency Provider Last Rate Last Dose  . acetaminophen (TYLENOL) tablet 650 mg  650 mg Oral Q6H PRN Laverle Hobby, PA-C   650 mg at 01/19/17 1527  . alum & mag hydroxide-simeth (MAALOX/MYLANTA) 200-200-20 MG/5ML suspension 30 mL  30 mL Oral Q4H PRN Laverle Hobby, PA-C      . divalproex (DEPAKOTE) DR tablet 250 mg  250 mg Oral Q12H Laverle Hobby, PA-C   250 mg at 01/20/17 [id number]  . hydrOXYzine (ATARAX/VISTARIL) tablet 25 mg  25 mg Oral Q6H PRN Laverle Hobby, PA-C   25 mg at 01/19/17 2126  . magnesium hydroxide (MILK OF MAGNESIA) suspension 30 mL  30 mL Oral Daily PRN Laverle Hobby, PA-C      . QUEtiapine (SEROQUEL) tablet 200 mg  200 mg Oral QHS Benjamine Mola, FNP   200 mg at 01/19/17 2126  . traZODone (DESYREL) tablet 100 mg  100 mg Oral QHS PRN Laverle Hobby, PA-C   100 mg at 01/19/17 2126   PTA Medications: Prescriptions Prior to Admission  Medication Sig Dispense Refill Last Dose  . divalproex (DEPAKOTE) 250 MG DR tablet Take 1 tablet (250 mg total) by mouth every 8 (eight)  hours. 90 tablet 0 Past Month at Unknown time  . gabapentin (NEURONTIN) 100 MG capsule Take 1 capsule (100 mg total) by mouth 3 (three) times daily. 90 capsule 0 Past Month at Unknown time  . hydrOXYzine (ATARAX/VISTARIL) 25 MG tablet Take 1 tablet (25 mg total) by mouth every 6 (six) hours. For anxiety 30 tablet 0 Past Month at Unknown time  . QUEtiapine (SEROQUEL) 200 MG tablet Take 1 tablet (200 mg total) by mouth at bedtime. 30 tablet 0 Past Month at Unknown time  . traZODone (DESYREL) 100 MG tablet Take 1 tablet (100 mg total) by mouth at bedtime as needed for sleep. 30 tablet 0 Past Month at Unknown time  . nicotine (NICODERM CQ - DOSED IN MG/24 HOURS) 21 mg/24hr patch Place 1 patch (21 mg total) onto the skin daily. For smoking cessation (Patient not taking: Reported on 01/18/2017) 28 patch 0 Not Taking at Unknown time    Musculoskeletal: Strength & Muscle Tone: within normal limits Gait & Station: normal Patient leans: N/A  Psychiatric Specialty Exam: Physical Exam  Nursing note and vitals reviewed. Constitutional:  I CONCUR WITH PE DONE IN ED    Review of Systems  Psychiatric/Behavioral: Positive for depression, hallucinations, substance abuse and suicidal ideas. The patient is nervous/anxious and has insomnia.   All other systems reviewed and are negative.   Blood pressure 112/86, pulse 85, temperature 97.6 F (36.4 C), temperature source Oral, resp. rate 16, height 5\' 3"  (1.6 m), weight 54.4 kg (120 lb), last menstrual period 01/17/2017, SpO2 99 %.Body mass index is 21.26 kg/m.  General Appearance: Disheveled  Eye Contact:  Poor  Speech:  Clear and Coherent and Slow  Volume:  Normal  Mood:  Angry, Anxious, Depressed and Irritable  Affect:  Restricted  Thought Process:  Coherent, Goal Directed and Descriptions of Associations: Circumstantial  Orientation:  Other:  PLACE, SITUATION  Thought Content:  Delusions, Hallucinations: Auditory Command:  Kill self and  others Visual, Paranoid Ideation and Rumination- also sees shadows  Suicidal Thoughts:  Yes.  with intent/plan  Homicidal Thoughts:  No  Memory:  Immediate;  Fair Recent;   Fair Remote;   Fair  Judgement:  Poor  Insight:  Fair  Psychomotor Activity:  Normal  Concentration:  Concentration: Fair  Recall:  [employer] of Knowledge:  Fair  Language:  Fair  Akathisia:  No  Handed:  Right  AIMS (if indicated):     Assets:  Desire for Improvement Housing Social Support  ADL's:  Intact  Cognition:  WNL  Sleep:  Number of Hours: 6.5    Treatment Plan Summary: Patient is a 41 year old caucasian  female , who has a hx of mood sx as well as substance abuse ( cocaine, cannabis) , who who presented to [facility] ,with psychosis and SI with plan to OD.  Patient is seen as irritable , psychotic and suicidal. Will start treatment. Daily contact with patient to assess and evaluate symptoms and progress in treatment, Medication management and Plan to have patient participate in milieu, individual and group therapy Patient will benefit from inpatient treatment and stabilization.  Estimated length of stay is 5-7 days.  Reviewed past medical records,treatment plan.  Will start Depakote DR 250 mg po q8h for mood sx.  Depakote level in 5 days 1/29. Will start Seroquel 200 mg po qhs for mood sx, psychosis.  50mg   PRN agitation during the day. Will start Trazodone 100 mg po qhs prn for sleep. Will make available PRN medications as per agitation protocol. Will continue to monitor vitals ,medication compliance and treatment side effects while patient is here.  Will monitor for medical issues as well as call consult as needed.  Reviewed labs uds - pos for cocaine, THC , CBC HGB and HCT below normal, cmp - wnl, Will order EKG - qtc ( 08/21/16- wnl)  will order tsh, lipid panel, hba1c, pl if not already done.  CSW will start working on disposition. Pt to be referred to a substance abuse program. Patient to  participate in therapeutic milieu .   Observation Level/Precautions:  15 minute checks    Psychotherapy:  Individual and group therapy   Meds:  See [place]  Consultations:  As needed  Discharge Concerns:  Stability and safety       Physician Treatment Plan for Primary Diagnosis: Substance or medication-induced bipolar and related disorder with onset during intoxication ([place]) Long Term Goal(s): Improvement in symptoms so as ready for discharge  Short Term Goals: Ability to disclose and discuss suicidal ideas and Compliance with prescribed medications will improve  Physician Treatment Plan for Secondary Diagnosis: Principal Problem:Substance or medication-induced bipolar and related disorder with onset during intoxication ([place])    Long Term Goal(s): Improvement in symptoms so as ready for discharge  Short Term Goals: Ability to disclose and discuss suicidal ideas and Compliance with prescribed medications will improve  I certify that inpatient services furnished can reasonably be expected to improve the patient's condition.    [facility], NP [facility] 1/24/201811:05 AM

## 2017-01-20 NOTE — Progress Notes (Signed)
Nursing Progress Note 7p-7a  D) Patient presents irritable but cooperative. Patient has a labile mood. Patient can be unfocused at times and needs to be redirected. Patient isolative to room but did make a phone call. Patient reports passive SI and denies HI/AVH or pain. Patient complains of insomnia and anxiety. Patient contracts for safety at this time.   A) Emotional support given. Patient medicated with PM orders as prescribed. Medications reviewed with patient. Patient on q15 min safety checks. Opportunities for questions or concerns presented to patient. Patient encouraged to continue to work on treatment goals. Patient provided with snacks and fluids. Patient reminded how to provide code number to family and [facility] telephone information and policies reviewed.  R) Patient receptive to interaction with nurse. Patient remains safe on the unit at this time. Patient is resting in bed without complaints. Will continue to monitor.

## 2017-01-21 LAB — LIPID PANEL
Cholesterol: 153 mg/dL (ref 0–200)
HDL: 58 mg/dL (ref >40)
LDL CALC: 77 mg/dL (ref 0–99)
TRIGLYCERIDES: 89 mg/dL (ref <150)
Total CHOL/HDL Ratio: 2.6 RATIO
VLDL: 18 mg/dL (ref 0–40)

## 2017-01-21 LAB — TSH: TSH: 1.223 u[IU]/mL (ref 0.350–4.500)

## 2017-01-21 MED ORDER — GABAPENTIN 300 MG PO CAPS
300.0000 mg | ORAL_CAPSULE | Freq: Four times a day (QID) | ORAL | Status: DC
  Administered 2017-01-21 – 2017-01-25 (×15): 300 mg via ORAL
  Filled 2017-01-21: qty 1
  Filled 2017-01-21 (×2): qty 28
  Filled 2017-01-21 (×5): qty 1
  Filled 2017-01-21: qty 28
  Filled 2017-01-21 (×6): qty 1
  Filled 2017-01-21: qty 28
  Filled 2017-01-21 (×7): qty 1

## 2017-01-21 MED ORDER — QUETIAPINE FUMARATE 400 MG PO TABS
400.0000 mg | ORAL_TABLET | Freq: Every day | ORAL | Status: DC
  Administered 2017-01-21: 400 mg via ORAL
  Filled 2017-01-21 (×3): qty 1

## 2017-01-21 MED ORDER — CITALOPRAM HYDROBROMIDE 20 MG PO TABS
20.0000 mg | ORAL_TABLET | Freq: Every day | ORAL | Status: DC
  Administered 2017-01-22 – 2017-01-25 (×4): 20 mg via ORAL
  Filled 2017-01-21 (×6): qty 1
  Filled 2017-01-21: qty 7

## 2017-01-21 MED ORDER — QUETIAPINE FUMARATE 25 MG PO TABS
25.0000 mg | ORAL_TABLET | Freq: Every day | ORAL | Status: DC
Start: 2017-01-21 — End: 2017-01-22
  Administered 2017-01-21: 25 mg via ORAL
  Filled 2017-01-21 (×4): qty 1

## 2017-01-21 MED ORDER — QUETIAPINE FUMARATE 25 MG PO TABS
25.0000 mg | ORAL_TABLET | Freq: Three times a day (TID) | ORAL | Status: DC | PRN
  Administered 2017-01-22 – 2017-01-24 (×6): 25 mg via ORAL
  Filled 2017-01-21 (×5): qty 1
  Filled 2017-01-21: qty 10

## 2017-01-21 MED ORDER — QUETIAPINE FUMARATE 25 MG PO TABS
25.0000 mg | ORAL_TABLET | Freq: Every evening | ORAL | Status: DC
  Administered 2017-01-21: 25 mg via ORAL
  Filled 2017-01-21 (×3): qty 1

## 2017-01-21 MED ORDER — ENSURE ENLIVE PO LIQD
237.0000 mL | Freq: Two times a day (BID) | ORAL | Status: DC
  Administered 2017-01-21 – 2017-01-25 (×8): 237 mL via ORAL

## 2017-01-21 NOTE — Progress Notes (Signed)
[facility] MD Progress Note  01/21/2017 12:39 PM Deborah Melendez  MRN:  [id number] Subjective: Patient states " I continue to have these periods when I am so anxious , I panic, I feel like there is no other way , but die and then I sweat and feel restless and I cry. Its all me , I am not sure what to do , I need my medications to be increased."  Objective:Patient seen and chart reviewed.Discussed patient with treatment team.  Patient today seen as anxious , restless, reports continued panic sx as well as feeling of agitation and death wish. Pt reports sleep at night as disrupted - likely due to sleeping during daytime. Pt reports she continues to be motivated to go to a substance abuse treatment program. Discussed readjusting her medication dose - discussed coping, relaxation techniques. Continue to support.      Principal Problem: MDD (major depressive disorder), recurrent severe, without psychosis ([place]) : r/o bipolar disorder   Diagnosis:   Patient Active Problem List   Diagnosis Date Noted  . Panic attacks [F41.0] 01/21/2017  . 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 Cannabis use disorder, severe, dependence ([place]) [F12.20] 01/20/2017  . MDD (major depressive disorder), recurrent severe, without psychosis ([place]) [F33.2] 08/08/2015  . Cocaine use disorder, severe, dependence ([place]) [F14.20] 08/08/2015  . Cigar smoker motivated to quit [F17.290] 07/06/2014   Total Time spent with patient: 25 minutes  Past Psychiatric History: Please see H&P.   Past Medical History:  Past Medical History:  Diagnosis Date  . MRSA (methicillin resistant Staphylococcus aureus)    surgery on finger 3 years ago  . Substance or medication-induced bipolar and related disorder with onset during intoxication ([place]) 09/08/2016    Past Surgical History:  Procedure Laterality Date  . FINGER SURGERY     Family History:  Family History   Problem Relation Age of Onset  . Diabetes Mother   . Hypertension Mother   . Drug abuse Father   . Schizophrenia Maternal Aunt    Family Psychiatric  History: Please see H&P.  Social History: Please see H&P.  History  Alcohol Use  . Yes    Comment: occasional      History  Drug Use No    Comment: 30 days sober, just completed treatment at [facility] 8/11    Social History   Social History  . Marital status: Single    Spouse name: N/A  . Number of children: N/A  . Years of education: N/A   Social History Main Topics  . Smoking status: Current Every Day Smoker    Packs/day: 0.50    Types: Cigarettes  . Smokeless tobacco: Never Used  . Alcohol use Yes     Comment: occasional   . Drug use: No     Comment: 30 days sober, just completed treatment at [facility] 8/11  . Sexual activity: Yes    Birth control/ protection: None   Other Topics Concern  . None   Social History Narrative  . None   Additional Social History:                         Sleep: Poor  Appetite:  Fair  Current Medications: Current Facility-Administered Medications  Medication Dose Route Frequency Provider Last Rate Last Dose  . acetaminophen (TYLENOL) tablet 650 mg  650 mg Oral Q6H PRN Laverle Hobby, PA-C   650 mg at 01/21/17 0930  .  alum & mag hydroxide-simeth (MAALOX/MYLANTA) 200-200-20 MG/5ML suspension 30 mL  30 mL Oral Q4H PRN Laverle Hobby, PA-C      . [START ON 01/22/2017] citalopram (CELEXA) tablet 20 mg  20 mg Oral Daily Hailly Fess, MD      . divalproex (DEPAKOTE) DR tablet 250 mg  250 mg Oral Q8H Waldine Zenz, MD   250 mg at 01/21/17 [id number]  . feeding supplement (ENSURE ENLIVE) (ENSURE ENLIVE) liquid 237 mL  237 mL Oral BID BM Iyari Hagner, MD   237 mL at 01/21/17 0935  . gabapentin (NEURONTIN) capsule 300 mg  300 mg Oral QID Ursula Alert, MD      . hydrOXYzine (ATARAX/VISTARIL) tablet 50 mg  50 mg Oral Q6H PRN Kerrie Buffalo, NP   50 mg at 01/20/17 2155  . magnesium  hydroxide (MILK OF MAGNESIA) suspension 30 mL  30 mL Oral Daily PRN Laverle Hobby, PA-C      . methocarbamol (ROBAXIN) tablet 500 mg  500 mg Oral Q6H PRN Kerrie Buffalo, NP      . QUEtiapine (SEROQUEL) tablet 25 mg  25 mg Oral TID PRN Ursula Alert, MD      . QUEtiapine (SEROQUEL) tablet 25 mg  25 mg Oral Q1200 Kenichi Cassada, MD      . QUEtiapine (SEROQUEL) tablet 25 mg  25 mg Oral QPM Karima Carrell, MD      . QUEtiapine (SEROQUEL) tablet 400 mg  400 mg Oral QHS Aarya Quebedeaux, MD      . traZODone (DESYREL) tablet 100 mg  100 mg Oral QHS PRN Laverle Hobby, PA-C   100 mg at 01/20/17 2127    Lab Results:  Results for orders placed or performed during the hospital encounter of 01/19/17 (from the past 48 hour(s))  Lipid panel     Status: None   Collection Time: 01/21/17  6:30 AM  Result Value Ref Range   Cholesterol 153 0 - 200 mg/dL   Triglycerides 89 <150 mg/dL   HDL 58 >40 mg/dL   Total CHOL/HDL Ratio 2.6 RATIO   VLDL 18 0 - 40 mg/dL   LDL Cholesterol 77 0 - 99 mg/dL    Comment:        Total Cholesterol/HDL:CHD Risk Coronary Heart Disease Risk Table                     Men   Women  1/2 Average Risk   3.4   3.3  Average Risk       5.0   4.4  2 X Average Risk   9.6   7.1  3 X Average Risk  23.4   11.0        Use the calculated Patient Ratio above and the CHD Risk Table to determine the patient's CHD Risk.        ATP III CLASSIFICATION (LDL):  <100     mg/dL   Optimal  100-129  mg/dL   Near or Above                    Optimal  130-159  mg/dL   Borderline  160-189  mg/dL   High  >190     mg/dL   Very High Performed at [facility] [address] [place], [place] [address]   TSH     Status: None   Collection Time: 01/21/17  6:30 AM  Result Value Ref Range   TSH 1.223 0.350 -  4.500 uIU/mL    Comment: Performed by a 3rd Generation assay with a functional sensitivity of <=0.01 uIU/mL. Performed at [facility] [place] [address] [place],  [place] [address]     Blood Alcohol level:  Lab Results  Component Value Date   ETH <5 01/18/2017   ETH <5 123XX123    Metabolic Disorder Labs: No results found for: HGBA1C, MPG No results found for: PROLACTIN Lab Results  Component Value Date   CHOL 153 01/21/2017   TRIG 89 01/21/2017   HDL 58 01/21/2017   CHOLHDL 2.6 01/21/2017   VLDL 18 01/21/2017   LDLCALC 77 01/21/2017    Physical Findings: AIMS: Facial and Oral Movements Muscles of Facial Expression: None, normal Lips and Perioral Area: None, normal Jaw: None, normal Tongue: None, normal,Extremity Movements Upper (arms, wrists, hands, fingers): None, normal Lower (legs, knees, ankles, toes): None, normal, Trunk Movements Neck, shoulders, hips: None, normal, Overall Severity Severity of abnormal movements (highest score from questions above): None, normal Incapacitation due to abnormal movements: None, normal Patient's awareness of abnormal movements (rate only patient's report): No Awareness, Dental Status Current problems with teeth and/or dentures?: No Does patient usually wear dentures?: No  CIWA:  CIWA-Ar Total: 2 COWS:  COWS Total Score: 1  Musculoskeletal: Strength & Muscle Tone: within normal limits Gait & Station: normal Patient leans: N/A  Psychiatric Specialty Exam: Physical Exam  Nursing note and vitals reviewed.   Review of Systems  Psychiatric/Behavioral: Positive for suicidal ideas. The patient is nervous/anxious and has insomnia.   All other systems reviewed and are negative.   Blood pressure (!) 123/101, pulse 89, temperature 98.3 F (36.8 C), temperature source Oral, resp. rate 16, height 5\' 3"  (1.6 m), weight 54.4 kg (120 lb), last menstrual period 01/17/2017, SpO2 99 %.Body mass index is 21.26 kg/m.  General Appearance: Fairly Groomed  Eye Contact:  Fair  Speech:  Normal Rate  Volume:  Normal  Mood:  Anxious, Depressed, Dysphoric, Hopeless and Irritable  Affect:  Labile  Thought  Process:  Goal Directed and Descriptions of Associations: Circumstantial  Orientation:  Full (Time, Place, and Person)  Thought Content:  Rumination  Suicidal Thoughts:  Yes.  without intent/plan  Homicidal Thoughts:  No  Memory:  Immediate;   Fair Recent;   Fair Remote;   Fair  Judgement:  Impaired  Insight:  Shallow  Psychomotor Activity:  Restlessness  Concentration:  Concentration: Poor and Attention Span: Poor  Recall:  [employer] of Knowledge:  Fair  Language:  Fair  Akathisia:  No  Handed:  Right  AIMS (if indicated):     Assets:  Communication Skills Desire for Improvement  ADL's:  Intact  Cognition:  WNL  Sleep:  Number of Hours: 6.75     Treatment Plan Summary:Patient today seen as anxious , agitated , restless has sleep issues , crying spells and mood lability. Pt will need to be continued on medications and observe on the unit.Pt with mood lability, restlessness, anger issues - likely induced by substance abuse - need to R/O Primary - Bipolar do.   MDD (major depressive disorder), recurrent severe, without psychosis ([place]) unstable  Will continue today 01/21/17  plan as below except where it is noted.   Daily contact with patient to assess and evaluate symptoms and progress in treatment and Medication management Reviewed past medical records,treatment plan.   For affective sx: Celexa 20 mg po daily.  For mood lability: Depakote 250 mg po q8h. Depakote level -  in 5 days. Increase Gabapentin to 300 mg po qid. Increase Seroquel to 25 mg po bid and 400 mg po qhs.  For insomnia: Seroquel 400 mg po qhs .  Will continue to monitor vitals ,medication compliance and treatment side effects while patient is here.   Will monitor for medical issues as well as call consult as needed.   Reviewed labs tsh - wnl, uds pos for cocaine, thc , pending hba1c, pl.  I have reviewed EKG for qtc - wnl.  CSW will continue working on disposition.   Patient to participate in  therapeutic milieu .      Maitland Lesiak, MD 01/21/2017, 12:39 PM

## 2017-01-21 NOTE — BHH Group Notes (Signed)
[facility] Group Notes:  (Nursing/MHT/Case Management/Adjunct)  Date:  01/21/2017  Time:  11:29 AM  Type of Therapy:  Nurse Education  Participation Level:  Did Not Attend  Participation Quality:  Patient did not attend  Affect:  Patient did not attend  Cognitive:  Patient did not attend  Insight:  None  Engagement in Group:  Patient did not attend  Modes of Intervention:  Discussion and patient did not attend group  Summary of Progress/Problems:  Patient attended and participated in a nurse led group.  Patients were asked to verbalize changes that they would like to make in their lives, barriers to change, and to identify the steps needed for change. Patient did not attend group.  Donne Hazel P 01/21/2017, 11:29 AM

## 2017-01-21 NOTE — BHH Suicide Risk Assessment (Signed)
[place] [facility] INPATIENT:  Family/Significant Other Suicide Prevention Education  Suicide Prevention Education:  Patient Refusal for Family/Significant Other Suicide Prevention Education: The patient Deborah Melendez has refused to provide written consent for family/significant other to be provided Family/Significant Other Suicide Prevention Education during admission and/or prior to discharge.  Physician notified.  SPE completed with pt, as pt refused to consent to family contact. SPI pamphlet provided to pt and pt was encouraged to share information with support network, ask questions, and talk about any concerns relating to SPE. Pt denies access to guns/firearms and verbalized understanding of information provided. Mobile Crisis information also provided to pt.   Bassem Bernasconi N Smart LCSW 01/21/2017, 2:08 PM

## 2017-01-21 NOTE — Progress Notes (Signed)
D:  Patient's self inventory sheet, patient has poor sleep, sleep medication not helpful.  Good appetite, low energy level, poor concentration.  Patient rated depression and hopeless #8, anxiety #6.  Denied withdrawals.  SI off/on, no plan, contracts for safety.  Denied HI.  A/V hallucinations off/on.  L knee pain, worst pain #1, pain medications helpful.  Goal is to feel better physically.  Would like to go to [facility] after discharge. A:  Medications administered per MD orders.  Emotional support and encouragement given patient. R:  Denied HI.  SI off/on, contracts for safety.  A/V hallucinations off/on.  Safety maintained with 15 minute checks.

## 2017-01-21 NOTE — Plan of Care (Signed)
Problem: Education: Goal: Utilization of techniques to improve thought processes will improve Outcome: Progressing Nurse discussed depression/coping skills with patient.    

## 2017-01-21 NOTE — Progress Notes (Signed)
Patient requested clothes to be brought out of locker 59 to her and washed.  Nurse removed blue jeans, pj bottoms, gray top and put in washer.  Patient's jacket not removed from locker.  Jacket is heavy and has large zipper.  Security guard present and agreed that jacket not needed on adult unit.

## 2017-01-21 NOTE — BHH Group Notes (Signed)
[facility] LCSW Group Therapy  01/21/2017 12:46 PM  Type of Therapy:  Group Therapy  Participation Level:  Did Not Attend-pt invited. Chose to remain in bed.   [facility] Speaker came to talk about his personal journey with substance abuse and addiction. The pt processed ways by which to relate to the speaker. [place] speaker provided handouts and educational information pertaining to groups and services offered by the [facility].   Arik Husmann N Smart LCSW 01/21/2017, 12:46 PM

## 2017-01-22 DIAGNOSIS — Z813 Family history of other psychoactive substance abuse and dependence: Secondary | ICD-10-CM

## 2017-01-22 DIAGNOSIS — F1099 Alcohol use, unspecified with unspecified alcohol-induced disorder: Secondary | ICD-10-CM

## 2017-01-22 LAB — HEMOGLOBIN A1C
Hgb A1c MFr Bld: 5.4 % (ref 4.8–5.6)
MEAN PLASMA GLUCOSE: 108 mg/dL

## 2017-01-22 MED ORDER — QUETIAPINE FUMARATE 400 MG PO TABS
400.0000 mg | ORAL_TABLET | Freq: Every day | ORAL | Status: DC
Start: 2017-01-22 — End: 2017-01-25
  Administered 2017-01-23 – 2017-01-24 (×3): 400 mg via ORAL
  Filled 2017-01-22: qty 1
  Filled 2017-01-22: qty 7
  Filled 2017-01-22 (×3): qty 1

## 2017-01-22 MED ORDER — DM-GUAIFENESIN ER 30-600 MG PO TB12
1.0000 | ORAL_TABLET | Freq: Two times a day (BID) | ORAL | Status: DC | PRN
  Administered 2017-01-22 – 2017-01-23 (×3): 1 via ORAL
  Filled 2017-01-22 (×4): qty 1

## 2017-01-22 NOTE — BHH Group Notes (Signed)
[facility] LCSW Aftercare Discharge Planning Group Note   01/22/2017 8:30 AM  Participation Quality:  Invited, chose not to attend.    Beverely Pace LCSW

## 2017-01-22 NOTE — Progress Notes (Signed)
Recreation Therapy Notes  Date: 01/22/17 Time: 0930 Location: 300 Hall Group Room  Group Topic: Stress Management  Goal Area(s) Addresses:  Patient will verbalize importance of using healthy stress management.  Patient will identify positive emotions associated with healthy stress management.   Intervention: Stress Management  Activity :  Progressive Muscle Relaxation.  LRT introduced the stress management technique of progressive muscle relaxation.  LRT read script to allow patients to engage fully in the activity.  Patients were to follow along while LRT read script to participate in the technique.  Education:  Stress Management, Discharge Planning.   Education Outcome: Acknowledges edcuation/In group clarification offered/Needs additional education  Clinical Observations/Feedback: Pt did not attend group.   Victorino Sparrow, LRT/CTRS         Victorino Sparrow A 01/22/2017 11:53 AM

## 2017-01-22 NOTE — BHH Suicide Risk Assessment (Signed)
[place] INPATIENT:  Family/Significant Other Suicide Prevention Education  Suicide Prevention Education:  Patient Refusal for Family/Significant Other Suicide Prevention Education: The patient Deborah Melendez has refused to provide written consent for family/significant other to be provided Family/Significant Other Suicide Prevention Education during admission and/or prior to discharge.  Physician notified.  SPE reviewed w patient as patient declined collateral consent.    Beverely Pace 01/22/2017, 4:23 PM

## 2017-01-22 NOTE — Progress Notes (Signed)
D    Pt is intrusive and talkative   She can be irritable and demanding at times   She did not come get her night time medications    She attended group   She was heard talking to another patient about drugs and what she plans to do with them A    Verbal support given    Medications administered and effectiveness monitored    Q 15 min checks R    Pt is safe at present time

## 2017-01-22 NOTE — Progress Notes (Signed)
[facility] MD Progress Note  01/22/2017 1:17 PM Deborah Melendez  MRN:  [id number]  Subjective: Patient states " I still can't seem to shake this depression today. I can't keep awake. I feel too drowsy, sedated. I can't think or focus on anything. I keep dozing off. I feel very tired, agitated, restless. I feel like lashing out at people. I need the medicine that I take during the day that is making me feel this way stopped. I don't need because I cannot function on it"  Objective: Patient seen and chart reviewed.Discussed patient with treatment team.  Patient today seen as anxious , restless, reports continued panic sx as well as feeling of agitation. Pt reports sleep at night as disrupted - likely due to sleeping during daytime. Says she feels daytime sedation & drowsiness. Pt reports she continues to be motivated to go to a substance abuse treatment program. Discussed readjusting her medication dose - discussed coping, relaxation techniques. Continue to support.  Principal Problem: MDD (major depressive disorder), recurrent severe, without psychosis ([place]) : r/o bipolar disorder   Diagnosis:   Patient Active Problem List   Diagnosis Date Noted  . Panic attacks [F41.0] 01/21/2017  . 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 Cannabis use disorder, severe, dependence ([place]) [F12.20] 01/20/2017  . MDD (major depressive disorder), recurrent severe, without psychosis ([place]) [F33.2] 08/08/2015  . Cocaine use disorder, severe, dependence ([place]) [F14.20] 08/08/2015  . Cigar smoker motivated to quit [F17.290] 07/06/2014   Total Time spent with patient: 15 minutes  Past Psychiatric History: Please see H&P.   Past Medical History:  Past Medical History:  Diagnosis Date  . MRSA (methicillin resistant Staphylococcus aureus)    surgery on finger 3 years ago  . Substance or medication-induced bipolar and related disorder with onset  during intoxication ([place]) 09/08/2016    Past Surgical History:  Procedure Laterality Date  . FINGER SURGERY     Family History:  Family History  Problem Relation Age of Onset  . Diabetes Mother   . Hypertension Mother   . Drug abuse Father   . Schizophrenia Maternal Aunt    Family Psychiatric  History: Please see H&P.  Social History: Please see H&P.  History  Alcohol Use  . Yes    Comment: occasional      History  Drug Use No    Comment: 30 days sober, just completed treatment at [facility] 8/11    Social History   Social History  . Marital status: Single    Spouse name: N/A  . Number of children: N/A  . Years of education: N/A   Social History Main Topics  . Smoking status: Current Every Day Smoker    Packs/day: 0.50    Types: Cigarettes  . Smokeless tobacco: Never Used  . Alcohol use Yes     Comment: occasional   . Drug use: No     Comment: 30 days sober, just completed treatment at [facility] 8/11  . Sexual activity: Yes    Birth control/ protection: None   Other Topics Concern  . None   Social History Narrative  . None   Additional Social History:   Sleep: Good  Appetite:  Fair  Current Medications: Current Facility-Administered Medications  Medication Dose Route Frequency Provider Last Rate Last Dose  . acetaminophen (TYLENOL) tablet 650 mg  650 mg Oral Q6H PRN Laverle Hobby, PA-C   650 mg at 01/21/17 2119  . alum & mag hydroxide-simeth (  MAALOX/MYLANTA) 200-200-20 MG/5ML suspension 30 mL  30 mL Oral Q4H PRN Laverle Hobby, PA-C      . citalopram (CELEXA) tablet 20 mg  20 mg Oral Daily Saramma Eappen, MD   20 mg at 01/22/17 1045  . dextromethorphan-guaiFENesin (MUCINEX DM) 30-600 MG per 12 hr tablet 1 tablet  1 tablet Oral BID PRN Rozetta Nunnery, NP   1 tablet at 01/22/17 1047  . divalproex (DEPAKOTE) DR tablet 250 mg  250 mg Oral Q8H Saramma Eappen, MD   250 mg at 01/22/17 0629  . feeding supplement (ENSURE ENLIVE) (ENSURE ENLIVE) liquid 237 mL  237  mL Oral BID BM Saramma Eappen, MD   237 mL at 01/22/17 1045  . gabapentin (NEURONTIN) capsule 300 mg  300 mg Oral QID Ursula Alert, MD   300 mg at 01/22/17 1048  . hydrOXYzine (ATARAX/VISTARIL) tablet 50 mg  50 mg Oral Q6H PRN Kerrie Buffalo, NP   50 mg at 01/20/17 2155  . magnesium hydroxide (MILK OF MAGNESIA) suspension 30 mL  30 mL Oral Daily PRN Laverle Hobby, PA-C      . methocarbamol (ROBAXIN) tablet 500 mg  500 mg Oral Q6H PRN Kerrie Buffalo, NP      . QUEtiapine (SEROQUEL) tablet 25 mg  25 mg Oral TID PRN Ursula Alert, MD      . QUEtiapine (SEROQUEL) tablet 25 mg  25 mg Oral Q1200 Saramma Eappen, MD   25 mg at 01/21/17 1311  . QUEtiapine (SEROQUEL) tablet 25 mg  25 mg Oral QPM Saramma Eappen, MD   25 mg at 01/21/17 1721  . QUEtiapine (SEROQUEL) tablet 400 mg  400 mg Oral QHS Ursula Alert, MD   400 mg at 01/21/17 2118  . traZODone (DESYREL) tablet 100 mg  100 mg Oral QHS PRN Laverle Hobby, PA-C   100 mg at 01/20/17 2127   Lab Results:  Results for orders placed or performed during the hospital encounter of 01/19/17 (from the past 48 hour(s))  Lipid panel     Status: None   Collection Time: 01/21/17  6:30 AM  Result Value Ref Range   Cholesterol 153 0 - 200 mg/dL   Triglycerides 89 <150 mg/dL   HDL 58 >40 mg/dL   Total CHOL/HDL Ratio 2.6 RATIO   VLDL 18 0 - 40 mg/dL   LDL Cholesterol 77 0 - 99 mg/dL    Comment:        Total Cholesterol/HDL:CHD Risk Coronary Heart Disease Risk Table                     Men   Women  1/2 Average Risk   3.4   3.3  Average Risk       5.0   4.4  2 X Average Risk   9.6   7.1  3 X Average Risk  23.4   11.0        Use the calculated Patient Ratio above and the CHD Risk Table to determine the patient's CHD Risk.        ATP III CLASSIFICATION (LDL):  <100     mg/dL   Optimal  100-129  mg/dL   Near or Above                    Optimal  130-159  mg/dL   Borderline  160-189  mg/dL   High  >190     mg/dL   Very High Performed at [facility]  Hospital Lab, [place] [address], [place], [place] [address]   TSH     Status: None   Collection Time: 01/21/17  6:30 AM  Result Value Ref Range   TSH 1.223 0.350 - 4.500 uIU/mL    Comment: Performed by a 3rd Generation assay with a functional sensitivity of <=0.01 uIU/mL. Performed at [facility], [place] [address] [place], [place] [address]   Hemoglobin A1c     Status: None   Collection Time: 01/21/17  6:30 AM  Result Value Ref Range   Hgb A1c MFr Bld 5.4 4.8 - 5.6 %    Comment: (NOTE)         Pre-diabetes: 5.7 - 6.4         Diabetes: >6.4         Glycemic control for adults with diabetes: <7.0    Mean Plasma Glucose 108 mg/dL    Comment: (NOTE) Performed At: [facility] [place], [place] [id number] Lindon Romp MD [id number] Performed at [facility], [place] [address] [place], [place] [address]    Blood Alcohol level:  Lab Results  Component Value Date   [facility] <5 01/18/2017   ETH <5 [id number]   Metabolic Disorder Labs: Lab Results  Component Value Date   HGBA1C 5.4 01/21/2017   MPG 108 01/21/2017   No results found for: PROLACTIN Lab Results  Component Value Date   CHOL 153 01/21/2017   TRIG 89 01/21/2017   HDL 58 01/21/2017   CHOLHDL 2.6 01/21/2017   VLDL 18 01/21/2017   LDLCALC 77 01/21/2017   Physical Findings: AIMS: Facial and Oral Movements Muscles of Facial Expression: None, normal Lips and Perioral Area: None, normal Jaw: None, normal Tongue: None, normal,Extremity Movements Upper (arms, wrists, hands, fingers): None, normal Lower (legs, knees, ankles, toes): None, normal, Trunk Movements Neck, shoulders, hips: None, normal, Overall Severity Severity of abnormal movements (highest score from questions above): None, normal Incapacitation due to abnormal movements: None, normal Patient's awareness of abnormal movements (rate only patient's report): No Awareness, Dental Status Current problems  with teeth and/or dentures?: No Does patient usually wear dentures?: No  CIWA:  CIWA-Ar Total: 1 COWS:  COWS Total Score: 2  Musculoskeletal: Strength & Muscle Tone: within normal limits Gait & Station: normal Patient leans: N/A  Psychiatric Specialty Exam: Physical Exam  Nursing note and vitals reviewed.   Review of Systems  Psychiatric/Behavioral: Positive for suicidal ideas. The patient is nervous/anxious and has insomnia.   All other systems reviewed and are negative.   Blood pressure 123/86, pulse (!) 106, temperature 98.8 F (37.1 C), temperature source Oral, resp. rate 16, height 5\' 3"  (1.6 m), weight 54.4 kg (120 lb), last menstrual period 01/17/2017, SpO2 99 %.Body mass index is 21.26 kg/m.  General Appearance: Fairly Groomed  Eye Contact:  Fair  Speech:  Normal Rate  Volume:  Normal  Mood:  Anxious, Depressed, Dysphoric, Hopeless and Irritable  Affect:  Labile  Thought Process:  Goal Directed and Descriptions of Associations: Circumstantial  Orientation:  Full (Time, Place, and Person)  Thought Content:  Rumination  Suicidal Thoughts:  Yes.  without intent/plan  Homicidal Thoughts:  No  Memory:  Immediate;   Fair Recent;   Fair Remote;   Fair  Judgement:  Impaired  Insight:  Shallow  Psychomotor Activity:  Restlessness  Concentration:  Concentration: Poor and Attention Span: Poor  Recall:  [employer] of Knowledge:  Fair  Language:  Fair  Akathisia:  No  Handed:  Right  AIMS (if indicated):     Assets:  Communication Skills Desire for Improvement  ADL's:  Intact  Cognition:  WNL  Sleep:  Number of Hours: 5.5   Treatment Plan Summary: Patient today seen as upset. Says she feels too drowsy, sedated, unable to keep awake or do anything or participate in groups. She wants the scheduled daytime Seroquel 25 mg bid to be discontinue. Pt will need to be continued on other medications and observe on the unit. Pt with mood lability, restlessness, anger issues -  likely induced by substance abuse - need to R/O Primary - Bipolar do.  MDD (major depressive disorder), recurrent severe, without psychosis ([place]) unstable  Will continue today 01/22/17  plan as below except where it is noted.  Daily contact with patient to assess and evaluate symptoms and progress in treatment and Medication management Reviewed past medical records,treatment plan.   For affective sx: Will Celexa 20 mg po daily.  For mood lability: Will Depakote 250 mg po q8h. Depakote level - in 5 days (01-24-17). Will continue Gabapentin 300 mg po qid. Will discontinue Seroquel to 25 mg po bid due to increased sedation/drowsiness. Will continue Seroquel 25 mg tid prn for agitation. Patient is aware to ask for this medication if agitated. Will continue Seroquel 400 mg po qhs.  For insomnia: Will continue Seroquel 400 mg po q hs, adjusted dosing time to 2100 to give patient more time to sleep at night .  Will continue to monitor vitals ,medication compliance and treatment side effects while patient is here.   Will monitor for medical issues as well as call consult as needed.   Reviewed labs tsh - wnl, uds pos for cocaine, thc ,  hba1c, result reviewed, within norm,  Pl, result pending.  I have reviewed EKG for qtc - wnl.  CSW will continue working on disposition.   Patient to participate in therapeutic milieu .   Lindell Spar I, NP 01/22/2017, 1:17 PMPatient ID: Deborah Melendez, female   DOB: 10/17/1976, 41 y.o.   MRN: [id number]

## 2017-01-22 NOTE — Progress Notes (Signed)
D: Patient's self inventory sheet: patient has fair sleep, recieved sleep medication.good  Appetite, low energy level, poor concentration. Rated depression 8/10, hopeless 8/10, anxiety 8/10. SI/HI/AVH: continues to endorse SI, denies HI and AVH. Physical complaints are denied. Patient reports racing intrusive thoughts. Irritable when interacting with staff and on telephone. A: Medications administered, assessed medication knowledge and education given on medication regimen.  Emotional support and encouragement given patient. R: Reports SI and denies HI , contracts for safety. Safety maintained with 15 minute checks.

## 2017-01-22 NOTE — Plan of Care (Signed)
Problem: Activity: Goal: Interest or engagement in activities will improve Outcome: Progressing Patient attended more groups and meals in the cafeteria than yesterday.  Problem: Coping: Goal: Ability to demonstrate self-control will improve Outcome: Progressing Patient without out burst or incidents today. Patient able to make needs known appropriately.

## 2017-01-22 NOTE — Progress Notes (Addendum)
Nursing Progress Note 7p-7a  D) Patient presents with flat affect and irritable mood. Patient denies/reports SI/HI or AVH. Patient complains of pain to her left knee and an occasional headache. Patient contracts for safety at this time. At 1215 Patient complains of a sore throat and stuffy nose. Patient has no fever. No nausea/vomitting.   A) Patient medicated with PM orders as prescribed. Medications reviewed with patient.  Emotional support given. Patient on q15 min safety checks. Opportunities for questions or concerns presented to patient. NP Corene Cornea notified about patient complaints and new orders received.  R) Patient went back to sleep after new orders received. Patient observed snoring, respirations even and unlabored. Will medicated if patient wakes up again. Patient remains safe on the unit at this time. Will continue to monitor.

## 2017-01-22 NOTE — Progress Notes (Signed)
Pt did not attend group. 

## 2017-01-22 NOTE — [facility] Group Notes (Signed)
Type of Therapy: Group Therapy- Feelings Around Relapse and Recovery  Participation Level: Attended briefly, left group and did not return  Participation Quality:  Appropriate  Affect:  Appropriate  Cognitive: Alert and Oriented   Insight:  Developing   Engagement in Therapy: Developing/Improving and Engaged   Modes of Intervention: Clarification, Confrontation, Discussion, Education, Exploration, Limit-setting, Orientation, Problem-solving, Rapport Building, Art therapist, Socialization and Support  Summary of Progress/Problems: The topic for today was feelings about relapse. The group discussed what relapse prevention is to them and identified triggers that they are on the path to relapse. Members also processed their feeling towards relapse and were able to relate to common experiences. Group also discussed coping skills that can be used for relapse prevention.     Therapeutic Modalities:   Cognitive Behavioral Therapy Solution-Focused Therapy Assertiveness Training Relapse Prevention Therapy

## 2017-01-23 MED ORDER — NICOTINE 21 MG/24HR TD PT24
21.0000 mg | MEDICATED_PATCH | Freq: Every day | TRANSDERMAL | Status: DC
  Administered 2017-01-23 – 2017-01-25 (×3): 21 mg via TRANSDERMAL
  Filled 2017-01-23: qty 14
  Filled 2017-01-23 (×4): qty 1

## 2017-01-23 MED ORDER — BIOTENE DRY MOUTH MT LIQD
15.0000 mL | OROMUCOSAL | Status: DC | PRN
  Filled 2017-01-23: qty 237

## 2017-01-23 NOTE — Progress Notes (Signed)
D    Pt is intrusive and talkative   She can be irritable and demanding at times   She did  come get her night time medications    She attended group   She was heard talking to another patient about drugs and what she plans to do with them A    Verbal support given    Medications administered and effectiveness monitored    Q 15 min checks R    Pt is safe at present time

## 2017-01-23 NOTE — Progress Notes (Signed)
[facility] MD Progress Note  01/23/2017 1:48 PM Deborah Melendez  MRN:  [id number]  Subjective: Patient states " I have dry mouth and nobody care's." , patient reports she has requested medications for her mouth and skin.  Objective: [name] is awake, alert and oriented *3. Seen sitting in dayroom interacting with peers.  Denies suicidal or homicidal ideation. Denies auditory or visual hallucination and does not appear to be responding to internal stimuli. Patient is requesting to be discharged on tomorrow. States she has seen her kids and would like to visit with them before she goes into treatment on Tuesday at [facility].  Patient reports interacting well with staff and others. Patient reports she is medication compliant without mediation side effects.   States her depression 0/10 during this assessment.  Reports good appetite and reports she is resting well.  Support, encouragement and reassurance was provided.   Principal Problem: MDD (major depressive disorder), recurrent severe, without psychosis ([place]) : R/O bipolar disorder   Diagnosis:   Patient Active Problem List   Diagnosis Date Noted  . Panic attacks [F41.0] 01/21/2017  . 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 Cannabis use disorder, severe, dependence ([place]) [F12.20] 01/20/2017  . MDD (major depressive disorder), recurrent severe, without psychosis ([place]) [F33.2] 08/08/2015  . Cocaine use disorder, severe, dependence ([place]) [F14.20] 08/08/2015  . Cigar smoker motivated to quit [F17.290] 07/06/2014   Total Time spent with patient: 15 minutes  Past Psychiatric History: Please see H&P.   Past Medical History:  Past Medical History:  Diagnosis Date  . MRSA (methicillin resistant Staphylococcus aureus)    surgery on finger 3 years ago  . Substance or medication-induced bipolar and related disorder with onset during intoxication ([place]) 09/08/2016    Past  Surgical History:  Procedure Laterality Date  . FINGER SURGERY     Family History:  Family History  Problem Relation Age of Onset  . Diabetes Mother   . Hypertension Mother   . Drug abuse Father   . Schizophrenia Maternal Aunt    Family Psychiatric  History: Please see H&P.  Social History: Please see H&P.  History  Alcohol Use  . Yes    Comment: occasional      History  Drug Use No    Comment: 30 days sober, just completed treatment at [facility] 8/11    Social History   Social History  . Marital status: Single    Spouse name: N/A  . Number of children: N/A  . Years of education: N/A   Social History Main Topics  . Smoking status: Current Every Day Smoker    Packs/day: 0.50    Types: Cigarettes  . Smokeless tobacco: Never Used  . Alcohol use Yes     Comment: occasional   . Drug use: No     Comment: 30 days sober, just completed treatment at [facility] 8/11  . Sexual activity: Yes    Birth control/ protection: None   Other Topics Concern  . None   Social History Narrative  . None   Additional Social History:   Sleep: Good  Appetite:  Fair  Current Medications: Current Facility-Administered Medications  Medication Dose Route Frequency Provider Last Rate Last Dose  . acetaminophen (TYLENOL) tablet 650 mg  650 mg Oral Q6H PRN Laverle Hobby, PA-C   650 mg at 01/22/17 2007  . alum & mag hydroxide-simeth (MAALOX/MYLANTA) 200-200-20 MG/5ML suspension 30 mL  30 mL Oral Q4H PRN Maurine Minister  Simon, PA-C      . antiseptic oral rinse (BIOTENE) solution 15 mL  15 mL Mouth Rinse PRN Derrill Center, NP      . citalopram (CELEXA) tablet 20 mg  20 mg Oral Daily Ursula Alert, MD   20 mg at 01/23/17 0845  . dextromethorphan-guaiFENesin (MUCINEX DM) 30-600 MG per 12 hr tablet 1 tablet  1 tablet Oral BID PRN Rozetta Nunnery, NP   1 tablet at 01/23/17 0644  . divalproex (DEPAKOTE) DR tablet 250 mg  250 mg Oral Q8H Saramma Eappen, MD   250 mg at 01/23/17 0644  . feeding supplement  (ENSURE ENLIVE) (ENSURE ENLIVE) liquid 237 mL  237 mL Oral BID BM Saramma Eappen, MD   237 mL at 01/22/17 1045  . gabapentin (NEURONTIN) capsule 300 mg  300 mg Oral QID Ursula Alert, MD   300 mg at 01/23/17 1118  . hydrOXYzine (ATARAX/VISTARIL) tablet 50 mg  50 mg Oral Q6H PRN Kerrie Buffalo, NP   50 mg at 01/23/17 0644  . magnesium hydroxide (MILK OF MAGNESIA) suspension 30 mL  30 mL Oral Daily PRN Laverle Hobby, PA-C      . methocarbamol (ROBAXIN) tablet 500 mg  500 mg Oral Q6H PRN Kerrie Buffalo, NP   500 mg at 01/22/17 1737  . nicotine (NICODERM CQ - dosed in mg/24 hours) patch 21 mg  21 mg Transdermal Daily Jenne Campus, MD   21 mg at 01/23/17 1118  . QUEtiapine (SEROQUEL) tablet 25 mg  25 mg Oral TID PRN Ursula Alert, MD   25 mg at 01/23/17 0644  . QUEtiapine (SEROQUEL) tablet 400 mg  400 mg Oral QHS Encarnacion Slates, NP   400 mg at 01/23/17 0008  . traZODone (DESYREL) tablet 100 mg  100 mg Oral QHS PRN Laverle Hobby, PA-C   100 mg at 01/23/17 0008   Lab Results:  No results found for this or any previous visit (from the past 48 hour(s)). Blood Alcohol level:  Lab Results  Component Value Date   ETH <5 01/18/2017   ETH <5 123XX123   Metabolic Disorder Labs: Lab Results  Component Value Date   HGBA1C 5.4 01/21/2017   MPG 108 01/21/2017   No results found for: PROLACTIN Lab Results  Component Value Date   CHOL 153 01/21/2017   TRIG 89 01/21/2017   HDL 58 01/21/2017   CHOLHDL 2.6 01/21/2017   VLDL 18 01/21/2017   LDLCALC 77 01/21/2017   Physical Findings: AIMS: Facial and Oral Movements Muscles of Facial Expression: None, normal Lips and Perioral Area: None, normal Jaw: None, normal Tongue: None, normal,Extremity Movements Upper (arms, wrists, hands, fingers): None, normal Lower (legs, knees, ankles, toes): None, normal, Trunk Movements Neck, shoulders, hips: None, normal, Overall Severity Severity of abnormal movements (highest score from questions above):  None, normal Incapacitation due to abnormal movements: None, normal Patient's awareness of abnormal movements (rate only patient's report): No Awareness, Dental Status Current problems with teeth and/or dentures?: No Does patient usually wear dentures?: No  CIWA:  CIWA-Ar Total: 1 COWS:  COWS Total Score: 2  Musculoskeletal: Strength & Muscle Tone: within normal limits Gait & Station: normal Patient leans: N/A  Psychiatric Specialty Exam: Physical Exam  Nursing note and vitals reviewed. Musculoskeletal: Normal range of motion.  Neurological: She is alert.  Psychiatric: She has a normal mood and affect. Her behavior is normal.    Review of Systems  Psychiatric/Behavioral: Positive for depression and suicidal ideas. The  patient is nervous/anxious and has insomnia.   All other systems reviewed and are negative.   Blood pressure 123/86, pulse (!) 106, temperature 98.8 F (37.1 C), temperature source Oral, resp. rate 16, height 5\' 3"  (1.6 m), weight 54.4 kg (120 lb), last menstrual period 01/17/2017, SpO2 99 %.Body mass index is 21.26 kg/m.  General Appearance: Fairly Groomed  Eye Contact:  Fair  Speech:  Normal Rate  Volume:  Normal  Mood:  Anxious, Depressed, Dysphoric and Irritable  Affect:  Labile  Thought Process:  Goal Directed and Descriptions of Associations: Circumstantial  Orientation:  Full (Time, Place, and Person)  Thought Content:  Rumination  Suicidal Thoughts:  No -Denies during this assessment  Homicidal Thoughts:  No  Memory:  Immediate;   Fair Recent;   Fair Remote;   Fair  Judgement:  Impaired  Insight:  Shallow  Psychomotor Activity:  Restlessness  Concentration:  Concentration: Poor and Attention Span: Poor  Recall:  [employer] of Knowledge:  Fair  Language:  Fair  Akathisia:  No  Handed:  Right  AIMS (if indicated):     Assets:  Communication Skills Desire for Improvement  ADL's:  Intact  Cognition:  WNL  Sleep:  Number of Hours: 6.75     I  agree with current treatment plan on 01/27//2018, Patient seen face-to-face for psychiatric evaluation follow-up, chart reviewed. Reviewed the information documented and agree with the treatment plan.  Treatment Plan Summary: Daily contact with patient to assess and evaluate symptoms and progress in treatment and Medication management  Started on Biotene PRN for drymouth MDD (major depressive disorder), recurrent severe, without psychosis ([place]) unstable  Will continue today 01/23/17  plan as below except where it is noted.  For affective sx: Will continue Celexa 20 mg po daily.  For mood lability: Will Depakote 250 mg po q8h. Depakote level - in 5 days (01-24-17). Will continue Gabapentin 300 mg po qid. Will discontinue Seroquel to 25 mg po bid due to increased sedation/drowsiness. Will continue Seroquel 25 mg tid prn for agitation. Patient is aware to ask for this medication if agitated. Will continue Seroquel 400 mg po qhs.  For insomnia: Will continue Seroquel 400 mg po q hs, adjusted dosing time to 2100 to give patient more time to sleep at night .  Will continue to monitor vitals ,medication compliance and treatment side effects while patient is here.   Will monitor for medical issues as well as call consult as needed.   Reviewed labs tsh - wnl, uds pos for cocaine, thc ,  hba1c, result reviewed, within normal CSW will continue working on disposition.  Patient to participate in therapeutic milieu .   Derrill Center, NP 01/23/2017, 1:48 PM

## 2017-01-23 NOTE — [facility] Group Notes (Signed)
[facility] Group Notes:  (Nursing/MHT/Case Management/Adjunct)  Date:  01/23/2017  Time:  5:07 PM  Type of Therapy:  Nurse Education  Participation Level:  Did Not Attend Aura Dials 01/23/2017, 5:07 PM

## 2017-01-23 NOTE — [facility] Group Notes (Signed)
[facility] LCSW Group Therapy Note  01/23/2017 10 to 11 AM  Type of Therapy and Topic:  Group Therapy: Avoiding Self-Sabotaging and Enabling Behaviors  Participation Level:  Did Not Attend; invited to participate yet did not despite overhead announcement and encouragement by staff.     Danyle Boening C Manal Kreutzer, LCSW 

## 2017-01-24 LAB — VALPROIC ACID LEVEL: Valproic Acid Lvl: 50 ug/mL (ref 50.0–100.0)

## 2017-01-24 NOTE — Progress Notes (Signed)
Patient ID: Deborah Melendez, female   DOB: [birth year], 41 y.o.   MRN: [id number] D: Client in bed most of the shift, got up complaining "I need my medicine and I need something for athletes feet. "they itching I'm sure its athletes feet, give me some spray or something. A: Writer provided emotional support, Probation officer asked client to remove socks to view her feet. Medications reviewed, administered as ordered. Staff will monitor q20min for safety. R: Client is safe on the unit. Client refused to remove socks.

## 2017-01-24 NOTE — Progress Notes (Signed)
[facility] MD Progress Note  01/24/2017 2:30 PM Deborah Melendez  MRN:  [id number]  Subjective: Patient states " I need to call my husband, but if he cant keep me safe for 2 days then I need to stay here until I get transferred to [facility]."   Objective: Deborah Melendez is awake, alert and oriented *3. Seen resting in bedroom.  Denies suicidal or homicidal ideation. Denies auditory or visual hallucination and does not appear to be responding to internal stimuli. Patient is requesting to be discharged early. States she hasn't seen her kids and would like to visit with them before she goes into treatment on Tuesday at [facility]. Patient continues to vacillated between her discharge date.   Patient reports interacting well with staff and others. Patient reports she is medication compliant without mediation side effects.   States her depression 0/10 during this assessment.  Reports good appetite and reports she is resting well.  Support, encouragement and reassurance was provided.   Principal Problem: MDD (major depressive disorder), recurrent severe, without psychosis ([place]) : R/O bipolar disorder   Diagnosis:   Patient Active Problem List   Diagnosis Date Noted  . Panic attacks [F41.0] 01/21/2017  . 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 Cannabis use disorder, severe, dependence ([place]) [F12.20] 01/20/2017  . MDD (major depressive disorder), recurrent severe, without psychosis ([place]) [F33.2] 08/08/2015  . Cocaine use disorder, severe, dependence ([place]) [F14.20] 08/08/2015  . Cigar smoker motivated to quit [F17.290] 07/06/2014   Total Time spent with patient: 15 minutes  Past Psychiatric History: Please see H&P.   Past Medical History:  Past Medical History:  Diagnosis Date  . MRSA (methicillin resistant Staphylococcus aureus)    surgery on finger 3 years ago  . Substance or medication-induced bipolar and related disorder  with onset during intoxication ([place]) 09/08/2016    Past Surgical History:  Procedure Laterality Date  . FINGER SURGERY     Family History:  Family History  Problem Relation Age of Onset  . Diabetes Mother   . Hypertension Mother   . Drug abuse Father   . Schizophrenia Maternal Aunt    Family Psychiatric  History: Please see H&P.  Social History: Please see H&P.  History  Alcohol Use  . Yes    Comment: occasional      History  Drug Use No    Comment: 30 days sober, just completed treatment at [facility] 8/11    Social History   Social History  . Marital status: Single    Spouse name: N/A  . Number of children: N/A  . Years of education: N/A   Social History Main Topics  . Smoking status: Current Every Day Smoker    Packs/day: 0.50    Types: Cigarettes  . Smokeless tobacco: Never Used  . Alcohol use Yes     Comment: occasional   . Drug use: No     Comment: 30 days sober, just completed treatment at [facility] 8/11  . Sexual activity: Yes    Birth control/ protection: None   Other Topics Concern  . None   Social History Narrative  . None   Additional Social History:   Sleep: Good  Appetite:  Fair  Current Medications: Current Facility-Administered Medications  Medication Dose Route Frequency Provider Last Rate Last Dose  . acetaminophen (TYLENOL) tablet 650 mg  650 mg Oral Q6H PRN Laverle Hobby, PA-C   650 mg at 01/24/17 0247  . alum & mag  hydroxide-simeth (MAALOX/MYLANTA) 200-200-20 MG/5ML suspension 30 mL  30 mL Oral Q4H PRN Laverle Hobby, PA-C      . antiseptic oral rinse (BIOTENE) solution 15 mL  15 mL Mouth Rinse PRN Derrill Center, NP      . citalopram (CELEXA) tablet 20 mg  20 mg Oral Daily Ursula Alert, MD   20 mg at 01/24/17 0802  . dextromethorphan-guaiFENesin (MUCINEX DM) 30-600 MG per 12 hr tablet 1 tablet  1 tablet Oral BID PRN Rozetta Nunnery, NP   1 tablet at 01/23/17 2127  . divalproex (DEPAKOTE) DR tablet 250 mg  250 mg Oral Q8H Saramma  Eappen, MD   250 mg at 01/24/17 0802  . feeding supplement (ENSURE ENLIVE) (ENSURE ENLIVE) liquid 237 mL  237 mL Oral BID BM Saramma Eappen, MD   237 mL at 01/24/17 1009  . gabapentin (NEURONTIN) capsule 300 mg  300 mg Oral QID Ursula Alert, MD   300 mg at 01/24/17 1409  . hydrOXYzine (ATARAX/VISTARIL) tablet 50 mg  50 mg Oral Q6H PRN Kerrie Buffalo, NP   50 mg at 01/23/17 2127  . magnesium hydroxide (MILK OF MAGNESIA) suspension 30 mL  30 mL Oral Daily PRN Laverle Hobby, PA-C      . methocarbamol (ROBAXIN) tablet 500 mg  500 mg Oral Q6H PRN Kerrie Buffalo, NP   500 mg at 01/23/17 2127  . nicotine (NICODERM CQ - dosed in mg/24 hours) patch 21 mg  21 mg Transdermal Daily Jenne Campus, MD   21 mg at 01/24/17 0802  . QUEtiapine (SEROQUEL) tablet 25 mg  25 mg Oral TID PRN Ursula Alert, MD   25 mg at 01/24/17 1009  . QUEtiapine (SEROQUEL) tablet 400 mg  400 mg Oral QHS Encarnacion Slates, NP   400 mg at 01/23/17 2127  . traZODone (DESYREL) tablet 100 mg  100 mg Oral QHS PRN Laverle Hobby, PA-C   100 mg at 01/23/17 2127   Lab Results:  Results for orders placed or performed during the hospital encounter of 01/19/17 (from the past 48 hour(s))  Valproic acid level     Status: None   Collection Time: 01/24/17  6:26 AM  Result Value Ref Range   Valproic Acid Lvl 50 50.0 - 100.0 ug/mL    Comment: Performed at [facility], [place] [address], [place], [place] [address]   Blood Alcohol level:  Lab Results  Component Value Date   [facility] <5 01/18/2017   ETH <5 123XX123   Metabolic Disorder Labs: Lab Results  Component Value Date   HGBA1C 5.4 01/21/2017   MPG 108 01/21/2017   No results found for: PROLACTIN Lab Results  Component Value Date   CHOL 153 01/21/2017   TRIG 89 01/21/2017   HDL 58 01/21/2017   CHOLHDL 2.6 01/21/2017   VLDL 18 01/21/2017   LDLCALC 77 01/21/2017   Physical Findings: AIMS: Facial and Oral Movements Muscles of Facial Expression: None,  normal Lips and Perioral Area: None, normal Jaw: None, normal Tongue: None, normal,Extremity Movements Upper (arms, wrists, hands, fingers): None, normal Lower (legs, knees, ankles, toes): None, normal, Trunk Movements Neck, shoulders, hips: None, normal, Overall Severity Severity of abnormal movements (highest score from questions above): None, normal Incapacitation due to abnormal movements: None, normal Patient's awareness of abnormal movements (rate only patient's report): No Awareness, Dental Status Current problems with teeth and/or dentures?: No Does patient usually wear dentures?: No  CIWA:  CIWA-Ar Total: 1 COWS:  COWS Total Score: 2  Musculoskeletal: Strength & Muscle Tone: within normal limits Gait & Station: normal Patient leans: N/A  Psychiatric Specialty Exam: Physical Exam  Nursing note and vitals reviewed. Musculoskeletal: Normal range of motion.  Neurological: She is alert.  Psychiatric: She has a normal mood and affect. Her behavior is normal.    Review of Systems  Psychiatric/Behavioral: Positive for depression and suicidal ideas. The patient is nervous/anxious and has insomnia.   All other systems reviewed and are negative.   Blood pressure 109/72, pulse 96, temperature 97.9 F (36.6 C), temperature source Oral, resp. rate 16, height 5\' 3"  (1.6 m), weight 54.4 kg (120 lb), last menstrual period 01/17/2017, SpO2 99 %.Body mass index is 21.26 kg/m.  General Appearance: Fairly Groomed  Eye Contact:  Fair  Speech:  Normal Rate  Volume:  Normal  Mood:  Anxious, Depressed, Dysphoric and Irritable  Affect:  Labile  Thought Process:  Goal Directed and Descriptions of Associations: Circumstantial  Orientation:  Full (Time, Place, and Person)  Thought Content:  Rumination  Suicidal Thoughts:  No -Denies during this assessment  Homicidal Thoughts:  No  Memory:  Immediate;   Fair Recent;   Fair Remote;   Fair  Judgement:  Impaired  Insight:  Shallow   Psychomotor Activity:  Restlessness  Concentration:  Concentration: Poor and Attention Span: Poor  Recall:  [employer] of Knowledge:  Fair  Language:  Fair  Akathisia:  No  Handed:  Right  AIMS (if indicated):     Assets:  Communication Skills Desire for Improvement  ADL's:  Intact  Cognition:  WNL  Sleep:  Number of Hours: 6.25     I agree with current treatment plan on 01/28//2018, Patient seen face-to-face for psychiatric evaluation follow-up, chart reviewed. Reviewed the information documented and agree with the treatment plan.  Treatment Plan Summary: Daily contact with patient to assess and evaluate symptoms and progress in treatment and Medication management  Started on Biotene PRN for drymouth MDD (major depressive disorder), recurrent severe, without psychosis ([place]) unstable  Will continue today 01/24/17  plan as below except where it is noted.  For affective sx: Will continue Celexa 20 mg po daily.  For mood lability: Will Depakote 250 mg po q8h. Depakote level - in 5 days (01-24-17). Will continue Gabapentin 300 mg po qid. Will discontinue Seroquel to 25 mg po bid due to increased sedation/drowsiness. Will continue Seroquel 25 mg tid prn for agitation. Patient is aware to ask for this medication if agitated. Will continue Seroquel 400 mg po qhs.  For insomnia: Will continue Seroquel 400 mg po q hs, adjusted dosing time to 2100 to give patient more time to sleep at night .  Will continue to monitor vitals ,medication compliance and treatment side effects while patient is here.   Will monitor for medical issues as well as call consult as needed.   Reviewed labs tsh - wnl, uds pos for cocaine, thc ,  hba1c, result reviewed, within normal CSW will continue working on disposition.  Patient to participate in therapeutic milieu .   Derrill Center, NP 01/24/2017, 2:30 PM

## 2017-01-24 NOTE — Progress Notes (Signed)
Patient attended [facility] group meeting late.

## 2017-01-25 DIAGNOSIS — F1220 Cannabis dependence, uncomplicated: Secondary | ICD-10-CM

## 2017-01-25 DIAGNOSIS — Z818 Family history of other mental and behavioral disorders: Secondary | ICD-10-CM

## 2017-01-25 DIAGNOSIS — F17210 Nicotine dependence, cigarettes, uncomplicated: Secondary | ICD-10-CM

## 2017-01-25 DIAGNOSIS — Z811 Family history of alcohol abuse and dependence: Secondary | ICD-10-CM

## 2017-01-25 DIAGNOSIS — F19980 Other psychoactive substance use, unspecified with psychoactive substance-induced anxiety disorder: Secondary | ICD-10-CM

## 2017-01-25 DIAGNOSIS — F410 Panic disorder [episodic paroxysmal anxiety] without agoraphobia: Secondary | ICD-10-CM

## 2017-01-25 MED ORDER — QUETIAPINE FUMARATE 400 MG PO TABS: 400.0000 mg | ORAL_TABLET | Freq: Every day | ORAL | 0 refills | Status: DC

## 2017-01-25 MED ORDER — TRAZODONE HCL 100 MG PO TABS: 100.0000 mg | ORAL_TABLET | Freq: Every evening | ORAL | 0 refills | Status: DC | PRN

## 2017-01-25 MED ORDER — NICOTINE 21 MG/24HR TD PT24: 21.0000 mg | MEDICATED_PATCH | Freq: Every day | TRANSDERMAL | 0 refills | Status: DC

## 2017-01-25 MED ORDER — GABAPENTIN 300 MG PO CAPS: 300.0000 mg | ORAL_CAPSULE | Freq: Four times a day (QID) | ORAL | 0 refills | Status: DC

## 2017-01-25 MED ORDER — DIVALPROEX SODIUM 250 MG PO DR TAB: 250.0000 mg | DELAYED_RELEASE_TABLET | Freq: Three times a day (TID) | ORAL | 0 refills | Status: DC

## 2017-01-25 MED ORDER — HYDROXYZINE HCL 50 MG PO TABS: 50.0000 mg | ORAL_TABLET | Freq: Four times a day (QID) | ORAL | 0 refills | Status: DC | PRN

## 2017-01-25 MED ORDER — CITALOPRAM HYDROBROMIDE 20 MG PO TABS: 20.0000 mg | ORAL_TABLET | Freq: Every day | ORAL | 0 refills | Status: DC

## 2017-01-25 MED ORDER — QUETIAPINE FUMARATE 25 MG PO TABS: 25.0000 mg | ORAL_TABLET | Freq: Three times a day (TID) | ORAL | 0 refills | Status: DC | PRN

## 2017-01-25 NOTE — Progress Notes (Signed)
  [facility] Adult Case Management Discharge Plan :  Will you be returning to the same living situation after discharge:  Yes,  w husband At discharge, do you have transportation home?: Yes,  husband will transport home and then to [facility] t[facility] per patient Do you have the ability to pay for your medications: No. Will be assisted by [facility] and [facility] w obtaining needed medications.    Release of information consent forms completed and in the chart;  Patient's signature needed at discharge.  Patient to Follow up at: Follow-up Information    [facility] Follow up on 01/26/2017.   Why:  Screening for possible admission on this date at 7:45AM. Please bring 14 day supply and prescriptions; photo ID/proof of [employer] residency. Thank you.  Contact information: [place] [address] [phone]        [facility] Follow up.   Specialty:  Behavioral Health Why:  Walk in between 8am-9am Monday through Friday for hospital follow-up/medication management/assessment for counseling services. Please walk in within 7 days of hospital discharge or discharge from [facility] if you go for further treatment.  Contact information: [place] [address] [phone]           Next level of care provider has access to [place] and Suicide Prevention discussed: Yes,  patient declined collateral contact, SPE reviewed w patient.   Have you used any form of tobacco in the last 30 days? (Cigarettes, Smokeless Tobacco, Cigars, and/or Pipes): Yes  Has patient been referred to the Quitline?: Patient refused referral  Patient has been referred for addiction treatment: Yes  Beverely Pace 01/25/2017, 10:20 AM

## 2017-01-25 NOTE — Discharge Summary (Signed)
Physician Discharge Summary Note  Patient:  Deborah Melendez is an 41 y.o., female MRN:  [id number] DOB:  [birth year] Patient phone:  [phone] (home)  Patient address:   [address] [place] [address],  Total Time spent with patient: Greater than 30 minutes  Date of Admission:  01/19/2017  Date of Discharge: 01/25/2017  Reason for Admission: Worsening symptoms of depression/suicidal ideations & drug use.   Principal Problem: MDD (major depressive disorder), recurrent severe, without psychosis [facility])  Discharge Diagnoses: Patient Active Problem List   Diagnosis Date Noted  . Panic attacks [F41.0] 01/21/2017  . 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 Cannabis use disorder, severe, dependence ([place]) [F12.20] 01/20/2017  . MDD (major depressive disorder), recurrent severe, without psychosis ([place]) [F33.2] 08/08/2015  . Cocaine use disorder, severe, dependence ([place]) [F14.20] 08/08/2015  . Cigar smoker motivated to quit [F17.290] 07/06/2014   Musculoskeletal: Strength & Muscle Tone: within normal limits Gait & Station: normal Patient leans: N/A  Psychiatric Specialty Exam: Physical Exam  Constitutional: She appears well-developed.  HENT:  Head: Normocephalic.  Eyes: Pupils are equal, round, and reactive to light.  Neck: Normal range of motion.  Cardiovascular: Normal rate.   Respiratory: Effort normal.  GI: Soft.  Genitourinary:  Genitourinary Comments: Denies any issues in this area.  Musculoskeletal: Normal range of motion.  Neurological: She is alert.  Skin: Skin is warm.  Psychiatric: She has a normal mood and affect. Her speech is normal and behavior is normal. Judgment and thought content normal. Cognition and memory are normal.    Review of Systems  Constitutional: Negative.   HENT: Negative.   Eyes: Negative.   Respiratory: Negative.   Cardiovascular: Negative.    Gastrointestinal: Negative.   Genitourinary: Negative.   Musculoskeletal: Negative.   Skin: Negative.   Neurological: Negative.   Endo/Heme/Allergies: Negative.   Psychiatric/Behavioral: Positive for depression (Stabilized with medication) and substance abuse (Hx. Cocaine & THC dependence). Negative for hallucinations, memory loss and suicidal ideas. The patient has insomnia (Stable). The patient is not nervous/anxious.     Blood pressure 104/82, pulse (!) 104, temperature 98.1 F (36.7 C), temperature source Oral, resp. rate 16, height 5\' 3"  (1.6 m), weight 54.4 kg (120 lb), last menstrual period 01/17/2017, SpO2 99 %.Body mass index is 21.26 kg/m.  See Md's SRA   Have you used any form of tobacco in the last 30 days? (Cigarettes, Smokeless Tobacco, Cigars, and/or Pipes): Yes  Has this patient used any form of tobacco in the last 30 days? (Cigarettes, Smokeless Tobacco, Cigars, and/or Pipes) Yes, A prescription for an FDA-approved tobacco cessation medication was offered at discharge and the patient refused  Past Medical History:  Past Medical History:  Diagnosis Date  . MRSA (methicillin resistant Staphylococcus aureus)    surgery on finger 3 years ago  . Substance or medication-induced bipolar and related disorder with onset during intoxication ([place]) 09/08/2016    Past Surgical History:  Procedure Laterality Date  . FINGER SURGERY     Family History:  Family History  Problem Relation Age of Onset  . Diabetes Mother   . Hypertension Mother   . Drug abuse Father   . Schizophrenia Maternal Aunt    Social History:  History  Alcohol Use  . Yes    Comment: occasional      History  Drug Use No    Comment: 30 days sober, just completed treatment at [facility] 8/11  Social History   Social History  . Marital status: Single    Spouse name: N/A  . Number of children: N/A  . Years of education: N/A   Social History Main Topics  . Smoking status: Current Every Day Smoker     Packs/day: 0.50    Types: Cigarettes  . Smokeless tobacco: Never Used  . Alcohol use Yes     Comment: occasional   . Drug use: No     Comment: 30 days sober, just completed treatment at [facility] 8/11  . Sexual activity: Yes    Birth control/ protection: None   Other Topics Concern  . None   Social History Narrative  . None   Level of Care:  OP  Hospital Course: Deborah Melendez a 41 y.o. AAfemalethat presents on this date with thoughts of self harm with a plan to run into traffic. She reports having attempted suicide one other time but did not go through with it.  Patient reports that she has not been on her medications "for months" and "gets like this" every few months. Patient denies any outpatient provider but has multiple inpatient admissions with the last one on 09/07/16 when patient presented with thoughts of self harm and depression. Patient states she has been diagnosed with MDD "years ago" but fails to follow up with after care due to lack of transportation issues.  Patient reports daily crack cocaine use.     Deborah Melendez was admitted to the [facility] adult 400 unit with complaints of worsening symptoms of depression triggering suicidal ideations with plans to walk out in front of traffic. She has hx of suicide attempt as well substance abuse issues. Her UDS was positive for Cocaine & THC. Deborah Melendez attributed her worsening depression to the fact that she has not been on her mental medications in months. She was in need of mood stabilization. After evaluation of her presenting symptoms, the medication regimen targeting those presenting symptoms were discussed & initiated. She was medicated & discharged on; Citalopram 20 mg daily for depression, Gabapentin 300 mg for agitation, Hydroxyzine 50 mg prn for anxiety, Depakote 250 mg for mood stabilization, Seroquel 25 mg & 400 mg respectively for agitation/mood control & Trazodone 100 mg for insomnia.  She was enrolled & encouraged to participate in the  group counseling sessions being offered & held on this unit. She learned coping skills.  During the course of her hospitalization, Deborah Melendez was evaluated daily by a clinical provider to ascertain her response to her treatment regimen. As her treatment progressed, improvement was noted by her report of decreasing symptoms, improved mood, presentation of good affect, medication tolerance & participation in the unit programming. Deborah Melendez presented no disruptive behaviors on the unit. She worked closely with the treatment team & case manager to develop a discharge plan with appropriate goals to maintain mood stability after discharge. She appeared motivated to abstain from further crack use as she realized that drug use has an effect on her mood instability.  On this day of her hospital discharge, Deborah Melendez was in much improved condition than upon admission. Her symptoms were reported as significantly decreased or resolved completely. She adamantly denied SI/HI,  AVH, delusional thoughts or paranoia. She was motivated to continue taking medication with a goal of continued improvement in mental health. Deborah Melendez was discharged home with a plan to follow up as noted below. She was provided with a 7 days worth, supply sample of her [facility] discharge medications. She left [facility] in stable condition with all belongings  returned to her. Transportation per her arrangement (husband).  Consults:  psychiatry  Discharge Vitals:   Blood pressure 104/82, pulse (!) 104, temperature 98.1 F (36.7 C), temperature source Oral, resp. rate 16, height 5\' 3"  (1.6 m), weight 54.4 kg (120 lb), last menstrual period 01/17/2017, SpO2 99 %. Body mass index is 21.26 kg/m.  Lab Results:   Results for orders placed or performed during the hospital encounter of 01/19/17 (from the past 72 hour(s))  Valproic acid level     Status: None   Collection Time: 01/24/17  6:26 AM  Result Value Ref Range   Valproic Acid Lvl 50 50.0 - 100.0 ug/mL     Comment: Performed at [facility], [place] [address] [place], [place] [address]   Physical Findings: AIMS: Facial and Oral Movements Muscles of Facial Expression: None, normal Lips and Perioral Area: None, normal Jaw: None, normal Tongue: None, normal,Extremity Movements Upper (arms, wrists, hands, fingers): None, normal Lower (legs, knees, ankles, toes): None, normal, Trunk Movements Neck, shoulders, hips: None, normal, Overall Severity Severity of abnormal movements (highest score from questions above): None, normal Incapacitation due to abnormal movements: None, normal Patient's awareness of abnormal movements (rate only patient's report): No Awareness, Dental Status Current problems with teeth and/or dentures?: No Does patient usually wear dentures?: No  CIWA:  CIWA-Ar Total: 1 COWS:  COWS Total Score: 2  See Psychiatric Specialty Exam and Suicide Risk Assessment completed by Attending Physician prior to discharge.  Discharge destination:  Home  Is patient on multiple antipsychotic therapies at discharge:  No   Has Patient had three or more failed trials of antipsychotic monotherapy by history:  No  Recommended Plan for Multiple Antipsychotic Therapies: NA  Allergies as of 01/25/2017   No Known Allergies     Medication List    TAKE these medications     Indication  citalopram 20 MG tablet Commonly known as:  CELEXA Take 1 tablet (20 mg total) by mouth daily. For depression  Indication:  Depression   divalproex 250 MG DR tablet Commonly known as:  DEPAKOTE Take 1 tablet (250 mg total) by mouth every 8 (eight) hours. For mood stabilization What changed:  additional instructions  Indication:  Mood stabilization   gabapentin 300 MG capsule Commonly known as:  NEURONTIN Take 1 capsule (300 mg total) by mouth 4 (four) times daily. For agitation What changed:  medication strength  how much to take  when to take this  additional instructions   Indication:  Agitation   hydrOXYzine 50 MG tablet Commonly known as:  ATARAX/VISTARIL Take 1 tablet (50 mg total) by mouth every 6 (six) hours as needed for anxiety. What changed:  medication strength  how much to take  when to take this  reasons to take this  additional instructions  Indication:  Anxiety Neurosis   nicotine 21 mg/24hr patch Commonly known as:  NICODERM CQ - dosed in mg/24 hours Place 1 patch (21 mg total) onto the skin daily. For smoking cessation  Indication:  Nicotine Addiction   QUEtiapine 25 MG tablet Commonly known as:  SEROQUEL Take 1 tablet (25 mg total) by mouth 3 (three) times daily as needed (severe anxiety/agitation). What changed:  medication strength  how much to take  when to take this  reasons to take this  Indication:  Severe agitation   QUEtiapine 400 MG tablet Commonly known as:  SEROQUEL Take 1 tablet (400 mg total) by mouth at bedtime. For mood control What changed:  You were already taking a medication with the same name, and this prescription was added. Make sure you understand how and when to take each.  Indication:  Mood control   traZODone 100 MG tablet Commonly known as:  DESYREL Take 1 tablet (100 mg total) by mouth at bedtime as needed for sleep.  Indication:  Trouble Sleeping      Follow-up Information    [facility] Follow up on 01/26/2017.   Why:  Screening for possible admission on this date at 7:45AM. Please bring 14 day supply and prescriptions; photo ID/proof of [employer] residency. Thank you.  Contact information: [place] [address] [phone]        [facility] Follow up.   Specialty:  Behavioral Health Why:  Walk in between 8am-9am Monday through Friday for hospital follow-up/medication management/assessment for counseling services. Please walk in within 7 days of hospital discharge or discharge from [facility] if you go for further treatment.  Contact  information: [place] [address] [phone]          Follow-up recommendations:  Activity:  As tolerated Diet: As recommended by your primary care doctor. Keep all scheduled follow-up appointments as recommended.  Comments:   Take all your medications as prescribed by your mental healthcare provider.  Report any adverse effects and or reactions from your medicines to your outpatient provider promptly.  Patient is instructed and cautioned to not engage in alcohol and or illegal drug use while on prescription medicines.  In the event of worsening symptoms, patient is instructed to call the crisis hotline, 911 and or go to the nearest ED for appropriate evaluation and treatment of symptoms.  Follow-up with your primary care provider for your other medical issues, concerns and or health care needs.   Signed: Encarnacion Slates, PMHNP, FNP-BC 01/26/2017, 11:02 AM    Patient seen, Suicide Assessment Completed.  Disposition Plan Reviewed

## 2017-01-25 NOTE — Tx Team (Signed)
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 acetaminophen (TYLENOL) tablet 650 mg  650 mg Oral Q6H PRN Laverle Hobby, PA-C   650 mg at 01/24/17 2125  . alum & mag hydroxide-simeth (MAALOX/MYLANTA) 200-200-20 MG/5ML suspension 30 mL  30 mL Oral Q4H PRN Laverle Hobby, PA-C      . antiseptic oral rinse (BIOTENE) solution 15 mL  15 mL Mouth Rinse PRN Derrill Center, NP      . citalopram (CELEXA) tablet 20 mg  20 mg Oral Daily Saramma Eappen, MD   20 mg at 01/25/17 0830  . dextromethorphan-guaiFENesin (MUCINEX DM) 30-600 MG per 12 hr tablet 1 tablet  1 tablet Oral BID PRN Rozetta Nunnery, NP   1 tablet at 01/23/17 2127  . divalproex (DEPAKOTE) DR tablet 250 mg  250 mg Oral Q8H Saramma Eappen, MD   250 mg at 01/25/17 0624  . feeding supplement (ENSURE ENLIVE) (ENSURE ENLIVE) liquid 237 mL  237 mL Oral BID BM Saramma Eappen, MD   237 mL at 01/25/17 1000  . gabapentin (NEURONTIN) capsule 300 mg  300 mg Oral QID Ursula Alert, MD   300 mg at 01/25/17 0830  . hydrOXYzine (ATARAX/VISTARIL) tablet 50 mg  50 mg Oral Q6H PRN Kerrie Buffalo, NP   50 mg at 01/24/17 1526  . magnesium hydroxide (MILK OF MAGNESIA) suspension 30 mL  30 mL Oral Daily PRN Laverle Hobby, PA-C      . methocarbamol (ROBAXIN) tablet 500 mg  500 mg Oral Q6H PRN Kerrie Buffalo, NP   500  mg at 01/23/17 2127  . nicotine (NICODERM CQ - dosed in mg/24 hours) patch 21 mg  21 mg Transdermal Daily Jenne Campus, MD   21 mg at 01/25/17 0831  . QUEtiapine (SEROQUEL) tablet 25 mg  25 mg Oral TID PRN Ursula Alert, MD   25 mg at 01/24/17 1009  . QUEtiapine (SEROQUEL) tablet 400 mg  400 mg Oral QHS Encarnacion Slates, NP   400 mg at 01/24/17 2122  . traZODone (DESYREL) tablet 100 mg  100 mg Oral QHS PRN Laverle Hobby, PA-C   100 mg at 01/23/17 2127   PTA Medications: Prescriptions Prior to Admission  Medication Sig Dispense Refill Last Dose  . divalproex (DEPAKOTE) 250 MG DR tablet Take 1 tablet (250 mg total) by mouth every 8 (eight) hours. 90 tablet 0 Past Month at Unknown time  . gabapentin (NEURONTIN) 100 MG capsule Take 1 capsule (100 mg total) by mouth 3 (three) times daily. 90 capsule 0 Past Month at Unknown time  . hydrOXYzine (ATARAX/VISTARIL) 25 MG tablet Take 1 tablet (25 mg total) by mouth every 6 (six) hours. For anxiety 30 tablet 0 Past Month at Unknown time  . QUEtiapine (SEROQUEL) 200 MG tablet Take 1 tablet (200 mg total) by mouth at bedtime. 30 tablet 0 Past Month at Unknown time  . traZODone (DESYREL) 100 MG tablet Take 1 tablet (100 mg total)  by mouth at bedtime as needed for sleep. 30 tablet 0 Past Month at Unknown time  . nicotine (NICODERM CQ - DOSED IN MG/24 HOURS) 21 mg/24hr patch Place 1 patch (21 mg total) onto the skin daily. For smoking cessation (Patient not taking: Reported on 01/18/2017) 28 patch 0 Not Taking at Unknown time    Patient Stressors: Financial difficulties Health problems Medication change or noncompliance Substance abuse  Patient Strengths: Average or above average intelligence Capable of independent living Communication skills  Treatment Modalities: Medication Management, Group therapy, Case management,  1 to 1 session with clinician, Psychoeducation, Recreational therapy.   Physician Treatment Plan for Primary Diagnosis: MDD (major  depressive disorder), recurrent severe, without psychosis ([place]) Long Term Goal(s):     Short Term Goals:    Medication Management: Evaluate patient's response, side effects, and tolerance of medication regimen.  Therapeutic Interventions: 1 to 1 sessions, Unit Group sessions and Medication administration.  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  Therapeutic Interventions: 1 to 1 sessions, Unit Group sessions and Medication administration.  Evaluation of Outcomes: Adequate for Discharge   RN Treatment Plan for Primary Diagnosis: MDD (major depressive disorder), recurrent severe, without psychosis ([place]) Long Term Goal(s): Knowledge of disease and therapeutic regimen to maintain health will improve  Short Term Goals: Ability to remain free from injury will improve, Ability to participate in decision making will improve and Ability to verbalize feelings will improve  Medication Management: RN will administer medications as ordered by provider, will assess and evaluate patient's response and provide education to patient for prescribed medication. RN will report any adverse and/or side effects to prescribing provider.  Therapeutic Interventions: 1 on 1 counseling sessions, Psychoeducation, Medication administration, Evaluate responses to treatment, Monitor vital signs and CBGs as ordered, Perform/monitor CIWA, COWS, AIMS and Fall Risk screenings as ordered,  Perform wound care treatments as ordered.  Evaluation of Outcomes: Adequate for Discharge   LCSW Treatment Plan for Primary Diagnosis: MDD (major depressive disorder), recurrent severe, without psychosis ([place]) Long Term Goal(s): Safe transition to appropriate next level of care at discharge, Engage patient in therapeutic group addressing interpersonal concerns.  Short Term Goals: Engage patient in aftercare planning with referrals and resources, Facilitate patient progression through stages of change regarding substance use diagnoses and concerns and Identify triggers associated with mental health/substance abuse issues  Therapeutic Interventions: Assess for all discharge needs, 1 to 1 time with Social worker, Explore available resources and support systems, Assess for adequacy in community support network, Educate family and significant other(s) on suicide prevention, Complete Psychosocial Assessment, Interpersonal group therapy.  Evaluation of Outcomes: Adequate for Discharge   Progress in Treatment: Attending groups: No. New to unit. Continuing to refuse to attend groups Participating in groups: No. Taking medication as prescribed: Yes. Toleration medication: Yes. Family/Significant other contact made: Patient refused collateral contact Patient understands diagnosis: Yes. Discussing patient identified problems/goals with staff: Yes. Medical problems stabilized or resolved: Yes. Denies suicidal/homicidal ideation: Yes. Issues/concerns per patient self-inventory: No. Other: n/a  New problem(s)  identified: No, Describe:  n/a  New Short Term/Long Term Goal(s): detox; medication stabilization; development of comprehensive mental wellness/sobriety plan.   Discharge Plan or Barriers: CSW assessing for appropriate referrals. Pt was admitted 09/07/16 and 08/20/16 for similar issues. During last admission, pt was referred to [facility] for continued inpatient treatment. Pt reports  medication noncompliance "for several months."   1/29:  Pt has [facility] tomorrow; states she wants to discharge today w husband in order to spend time w her children prior to admission.  Aware that [facility] will not be able to provide her w 14 day supply of medications nor provide transport to this appointment.  Pt aware and agreeable.    Reason for Continuation of Hospitalization: Depression Medication stabilization Withdrawal symptoms  Estimated Length of Stay:  Discharging today  Attendees: Patient: 01/25/2017 10:23 AM  Physician: Parke Poisson MD 01/25/2017 10:23 AM  Nursing: [place] Crane RN 01/25/2017 10:23 AM  RN Care Manager: Lars Pinks CM 01/25/2017 10:23 AM  Social Work:  Edwyna Shell LCSW 01/25/2017 10:23 AM  Recreational Therapist:  01/25/2017 10:23 AM  Other: Lindell Spar NP; May Augustin NP 01/25/2017 10:23 AM  Other:  01/25/2017 10:23 AM  Other: 01/25/2017 10:23 AM    Scribe for Treatment Team: Beverely Pace, LCSW 01/25/2017 10:23 AM

## 2017-01-25 NOTE — Progress Notes (Signed)
Pt discharged home with the husband. Pt was given another pair of shoe since there was only one shoe locked up, pt was ok with the new pair given. Pt was ambulatory,stable and appreciative at that time. All papers and prescriptions were given and valuables returned. Verbal understanding expressed. Denies SI/HI and A/VH. Pt given opportunity to express concerns and ask questions.

## 2017-01-25 NOTE — BHH Suicide Risk Assessment (Signed)
[facility] Discharge Suicide Risk Assessment   Principal Problem: MDD (major depressive disorder), recurrent severe, without psychosis ([place]) Discharge Diagnoses:  Patient Active Problem List   Diagnosis Date Noted  . Panic attacks [F41.0] 01/21/2017  . 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 Cannabis use disorder, severe, dependence ([place]) [F12.20] 01/20/2017  . MDD (major depressive disorder), recurrent severe, without psychosis ([place]) [F33.2] 08/08/2015  . Cocaine use disorder, severe, dependence ([place]) [F14.20] 08/08/2015  . Cigar smoker motivated to quit [F17.290] 07/06/2014    Total Time spent with patient: 30 minutes  Musculoskeletal: Strength & Muscle Tone: within normal limits Gait & Station: normal Patient leans: N/A  Psychiatric Specialty Exam: ROS denies headaches, no chest pain, no shortness of breath, no vomiting  Blood pressure 104/82, pulse (!) 104, temperature 98.1 F (36.7 C), temperature source Oral, resp. rate 16, height 5\' 3"  (1.6 m), weight 54.4 kg (120 lb), last menstrual period 01/17/2017, SpO2 99 %.Body mass index is 21.26 kg/m.  General Appearance: fairly groomed   Engineer, water::  Good  Speech:  Normal Rate409  Volume:  Normal  Mood:  states her mood is improved,minimizes depression at this time  Affect:  vaguely anxious, but fully reactive   Thought Process:  Linear  Orientation:  Full (Time, Place, and Person)  Thought Content:  no hallucinations, no delusions , not internally preoccuopied   Suicidal Thoughts:  No denies any suicidal or self injurious ideations, no homicidal or violent ideations  Homicidal Thoughts:  No  Memory:  recent and remote grossly intact   Judgement:  Other:  improved   Insight:  improved   Psychomotor Activity:  Normal  Concentration:  Good  Recall:  Good  Fund of Knowledge:Good  Language: Good  Akathisia:  Negative  Handed:  Right  AIMS (if  indicated):     Assets:  Desire for Improvement Resilience  Sleep:  Number of Hours: 6.75  Cognition: WNL  ADL's:  Intact   Mental Status Per Nursing Assessment::   On Admission:  Suicidal ideation indicated by patient  Demographic Factors:  41 year old married female   Loss Factors: Medication non compliance, relapse ( cocaine)   Historical Factors: History of depression , history of prior psychiatric admissions , history of substance abuse   Risk Reduction Factors:   Sense of responsibility to family, Living with another person, especially a relative and Positive coping skills or problem solving skills  Continued Clinical Symptoms:  At this time patient is alert, attentive, well related, calm, denies withdrawal symptoms, mood is reported as improved, affect is anxious but reactive, brightens during session, no thought disorder , no suicidal or self injurious ideations, no homicidal or violent ideations, no psychotic symptoms, future oriented. At this time denies any medication side effects . Of note, patient is scheduled to go to [facility] in the AM for inpatient rehab. States she remains motivated in following up on this plan, but is wanting discharge today so she can spend day with her loved ones , states she remains motivated in sobriety, rehab, and that her husband will transport her to [facility] tomorrow early in AM.  Cognitive Features That Contribute To Risk:  No gross cognitive deficits noted upon discharge. Is alert , attentive, and oriented x 3   Suicide Risk:  Mild:  Suicidal ideation of limited frequency, intensity, duration, and specificity.  There are no identifiable plans, no associated intent, mild dysphoria and related symptoms, good self-control (both objective  and subjective assessment), few other risk factors, and identifiable protective factors, including available and accessible social support.  Follow-up Information    [facility] Follow  up on 01/26/2017.   Why:  Screening for possible admission on this date at 7:45AM. Please bring 14 day supply and prescriptions; photo ID/proof of [employer] residency. Thank you.  Contact information: [place] [address] [phone]        [facility] Follow up.   Specialty:  Behavioral Health Why:  Walk in between 8am-9am Monday through Friday for hospital follow-up/medication management/assessment for counseling services. Please walk in within 7 days of hospital discharge or discharge from [facility] if you go for further treatment.  Contact information: [place] [address] [phone]           Plan Of Care/Follow-up recommendations:  Activity:  as tolerated  Diet:  Regular Tests:  NA Other:  see below  Patient is requesting discharge and there are no current grounds for involuntary commitment at this time Patient plans to spend day with her family and go to [place] early in AM tomorrow  Neita Garnet, MD 01/25/2017, 10:21 AM

## 2017-01-25 NOTE — Progress Notes (Signed)
Recreation Therapy Notes  Date: 01/25/17 Time: 0930 Location: 300 Hall Group Room  Group Topic: Stress Management  Goal Area(s) Addresses:  Patient will verbalize importance of using healthy stress management.  Patient will identify positive emotions associated with healthy stress management.   Intervention: Stress Management  Activity :  Daily Calm.  LRT introduced the stress management technique of meditation to the group.  LRT played a meditation from the Calm App to allow patients the opportunity to engage in the meditation.  Patients were to follow along with the meditation to fully engage in the technique.  Education:  Stress Management, Discharge Planning.   Education Outcome: Acknowledges edcuation/In group clarification offered/Needs additional education  Clinical Observations/Feedback: Pt did not attend group.    Victorino Sparrow, LRT/CTRS         Victorino Sparrow A 01/25/2017 12:29 PM

## 2017-02-04 ENCOUNTER — Emergency Department (HOSPITAL_COMMUNITY)
Admission: EM | Admit: 2017-02-04 | Discharge: 2017-02-04 | Disposition: A | Discharge status: Other Acute Inpatient Hospital | Payer: Medicaid Other | Attending: Emergency Medicine | Admitting: Emergency Medicine

## 2017-02-04 ENCOUNTER — Encounter (HOSPITAL_COMMUNITY): Payer: Self-pay | Admitting: Emergency Medicine

## 2017-02-04 ENCOUNTER — Inpatient Hospital Stay (HOSPITAL_COMMUNITY)
Admission: AD | Admit: 2017-02-04 | Discharge: 2017-02-07 | DRG: 885 | Disposition: A | Discharge status: Home / Self Care | Payer: Federal, State, Local not specified - Other | Source: Intra-hospital | Attending: Psychiatry | Admitting: Psychiatry

## 2017-02-04 ENCOUNTER — Encounter (HOSPITAL_COMMUNITY): Payer: Self-pay

## 2017-02-04 DIAGNOSIS — F410 Panic disorder [episodic paroxysmal anxiety] without agoraphobia: Secondary | ICD-10-CM | POA: Diagnosis not present

## 2017-02-04 DIAGNOSIS — Z9889 Other specified postprocedural states: Secondary | ICD-10-CM

## 2017-02-04 DIAGNOSIS — R45851 Suicidal ideations: Secondary | ICD-10-CM

## 2017-02-04 DIAGNOSIS — G4700 Insomnia, unspecified: Secondary | ICD-10-CM | POA: Diagnosis present

## 2017-02-04 DIAGNOSIS — Z833 Family history of diabetes mellitus: Secondary | ICD-10-CM | POA: Diagnosis not present

## 2017-02-04 DIAGNOSIS — Z813 Family history of other psychoactive substance abuse and dependence: Secondary | ICD-10-CM | POA: Diagnosis not present

## 2017-02-04 DIAGNOSIS — Z79899 Other long term (current) drug therapy: Secondary | ICD-10-CM | POA: Diagnosis not present

## 2017-02-04 DIAGNOSIS — F17210 Nicotine dependence, cigarettes, uncomplicated: Secondary | ICD-10-CM | POA: Diagnosis present

## 2017-02-04 DIAGNOSIS — F1420 Cocaine dependence, uncomplicated: Secondary | ICD-10-CM

## 2017-02-04 DIAGNOSIS — Z818 Family history of other mental and behavioral disorders: Secondary | ICD-10-CM

## 2017-02-04 DIAGNOSIS — F329 Major depressive disorder, single episode, unspecified: Secondary | ICD-10-CM | POA: Insufficient documentation

## 2017-02-04 DIAGNOSIS — Z811 Family history of alcohol abuse and dependence: Secondary | ICD-10-CM

## 2017-02-04 DIAGNOSIS — Z8614 Personal history of Methicillin resistant Staphylococcus aureus infection: Secondary | ICD-10-CM

## 2017-02-04 DIAGNOSIS — Z0000 Encounter for general adult medical examination without abnormal findings: Secondary | ICD-10-CM

## 2017-02-04 DIAGNOSIS — F314 Bipolar disorder, current episode depressed, severe, without psychotic features: Secondary | ICD-10-CM | POA: Diagnosis not present

## 2017-02-04 DIAGNOSIS — F319 Bipolar disorder, unspecified: Secondary | ICD-10-CM | POA: Diagnosis present

## 2017-02-04 DIAGNOSIS — Z915 Personal history of self-harm: Secondary | ICD-10-CM | POA: Diagnosis not present

## 2017-02-04 DIAGNOSIS — F19980 Other psychoactive substance use, unspecified with psychoactive substance-induced anxiety disorder: Secondary | ICD-10-CM | POA: Diagnosis not present

## 2017-02-04 DIAGNOSIS — F1220 Cannabis dependence, uncomplicated: Secondary | ICD-10-CM | POA: Diagnosis not present

## 2017-02-04 DIAGNOSIS — Z8249 Family history of ischemic heart disease and other diseases of the circulatory system: Secondary | ICD-10-CM

## 2017-02-04 DIAGNOSIS — F332 Major depressive disorder, recurrent severe without psychotic features: Secondary | ICD-10-CM

## 2017-02-04 HISTORY — DX: Bipolar disorder, current episode depressed, severe, without psychotic features: F31.4

## 2017-02-04 LAB — RAPID URINE DRUG SCREEN, HOSP PERFORMED
Amphetamines: NOT DETECTED
BARBITURATES: NOT DETECTED
Benzodiazepines: NOT DETECTED
Cocaine: NOT DETECTED
Opiates: NOT DETECTED
Tetrahydrocannabinol: NOT DETECTED

## 2017-02-04 LAB — COMPREHENSIVE METABOLIC PANEL
ALK PHOS: 40 U/L (ref 38–126)
ALT: 12 U/L — ABNORMAL LOW (ref 14–54)
AST: 16 U/L (ref 15–41)
Albumin: 3.9 g/dL (ref 3.5–5.0)
Anion gap: 6 (ref 5–15)
BILIRUBIN TOTAL: 0.4 mg/dL (ref 0.3–1.2)
BUN: 13 mg/dL (ref 6–20)
CALCIUM: 8.6 mg/dL — AB (ref 8.9–10.3)
CO2: 26 mmol/L (ref 22–32)
CREATININE: 0.88 mg/dL (ref 0.44–1.00)
Chloride: 104 mmol/L (ref 101–111)
GFR calc Af Amer: >60 mL/min (ref >60)
Glucose, Bld: 69 mg/dL (ref 65–99)
POTASSIUM: 4.3 mmol/L (ref 3.5–5.1)
Sodium: 136 mmol/L (ref 135–145)
TOTAL PROTEIN: 7 g/dL (ref 6.5–8.1)

## 2017-02-04 LAB — CBC
HCT: 33.8 % — ABNORMAL LOW (ref 36.0–46.0)
Hemoglobin: 11.2 g/dL — ABNORMAL LOW (ref 12.0–15.0)
MCH: 29.7 pg (ref 26.0–34.0)
MCHC: 33.1 g/dL (ref 30.0–36.0)
MCV: 89.7 fL (ref 78.0–100.0)
PLATELETS: 223 10*3/uL (ref 150–400)
RBC: 3.77 MIL/uL — ABNORMAL LOW (ref 3.87–5.11)
RDW: 17 % — AB (ref 11.5–15.5)
WBC: 6.5 10*3/uL (ref 4.0–10.5)

## 2017-02-04 LAB — ACETAMINOPHEN LEVEL: Acetaminophen (Tylenol), Serum: <10 ug/mL — ABNORMAL LOW (ref 10–30)

## 2017-02-04 LAB — SALICYLATE LEVEL: Salicylate Lvl: <7 mg/dL (ref 2.8–30.0)

## 2017-02-04 LAB — ETHANOL: Alcohol, Ethyl (B): <5 mg/dL (ref <5)

## 2017-02-04 LAB — PREGNANCY, URINE: PREG TEST UR: NEGATIVE

## 2017-02-04 MED ORDER — DIVALPROEX SODIUM 250 MG PO DR TAB
250.0000 mg | DELAYED_RELEASE_TABLET | Freq: Three times a day (TID) | ORAL | Status: DC
  Administered 2017-02-04 – 2017-02-05 (×2): 250 mg via ORAL
  Filled 2017-02-04 (×7): qty 1

## 2017-02-04 MED ORDER — GABAPENTIN 300 MG PO CAPS
300.0000 mg | ORAL_CAPSULE | Freq: Four times a day (QID) | ORAL | Status: DC
  Administered 2017-02-04 (×2): 300 mg via ORAL
  Filled 2017-02-04 (×2): qty 1

## 2017-02-04 MED ORDER — DIVALPROEX SODIUM 250 MG PO DR TAB
250.0000 mg | DELAYED_RELEASE_TABLET | Freq: Three times a day (TID) | ORAL | Status: DC
  Administered 2017-02-04 (×2): 250 mg via ORAL
  Filled 2017-02-04 (×2): qty 1

## 2017-02-04 MED ORDER — CITALOPRAM HYDROBROMIDE 10 MG PO TABS
20.0000 mg | ORAL_TABLET | Freq: Every day | ORAL | Status: DC
  Administered 2017-02-04: 20 mg via ORAL
  Filled 2017-02-04: qty 2

## 2017-02-04 MED ORDER — ONDANSETRON HCL 4 MG PO TABS: 4.0000 mg | ORAL_TABLET | Freq: Three times a day (TID) | ORAL | Status: DC | PRN

## 2017-02-04 MED ORDER — ACETAMINOPHEN 325 MG PO TABS
650.0000 mg | ORAL_TABLET | Freq: Four times a day (QID) | ORAL | Status: DC | PRN
  Administered 2017-02-05 – 2017-02-06 (×3): 650 mg via ORAL
  Filled 2017-02-04 (×3): qty 2

## 2017-02-04 MED ORDER — HYDROXYZINE HCL 25 MG PO TABS
50.0000 mg | ORAL_TABLET | Freq: Four times a day (QID) | ORAL | Status: DC | PRN
Start: 2017-02-04 — End: 2017-02-04

## 2017-02-04 MED ORDER — HYDROXYZINE HCL 50 MG PO TABS
50.0000 mg | ORAL_TABLET | Freq: Four times a day (QID) | ORAL | Status: DC | PRN
  Administered 2017-02-05 – 2017-02-07 (×5): 50 mg via ORAL
  Filled 2017-02-04 (×5): qty 1

## 2017-02-04 MED ORDER — GABAPENTIN 300 MG PO CAPS
300.0000 mg | ORAL_CAPSULE | Freq: Four times a day (QID) | ORAL | Status: DC
  Administered 2017-02-04 – 2017-02-07 (×12): 300 mg via ORAL
  Filled 2017-02-04 (×23): qty 1

## 2017-02-04 MED ORDER — QUETIAPINE FUMARATE 300 MG PO TABS: 400.0000 mg | ORAL_TABLET | Freq: Every day | ORAL | Status: DC

## 2017-02-04 MED ORDER — IBUPROFEN 200 MG PO TABS: 600.0000 mg | ORAL_TABLET | Freq: Three times a day (TID) | ORAL | Status: DC | PRN

## 2017-02-04 MED ORDER — LORAZEPAM 1 MG PO TABS: 1.0000 mg | ORAL_TABLET | Freq: Three times a day (TID) | ORAL | Status: DC | PRN

## 2017-02-04 MED ORDER — NICOTINE 21 MG/24HR TD PT24
21.0000 mg | MEDICATED_PATCH | Freq: Every day | TRANSDERMAL | Status: DC
  Filled 2017-02-04: qty 1

## 2017-02-04 MED ORDER — CITALOPRAM HYDROBROMIDE 20 MG PO TABS
20.0000 mg | ORAL_TABLET | Freq: Every day | ORAL | Status: DC
  Administered 2017-02-05 – 2017-02-07 (×3): 20 mg via ORAL
  Filled 2017-02-04 (×6): qty 1

## 2017-02-04 MED ORDER — QUETIAPINE FUMARATE 400 MG PO TABS
400.0000 mg | ORAL_TABLET | Freq: Every day | ORAL | Status: DC
  Administered 2017-02-04: 400 mg via ORAL
  Filled 2017-02-04: qty 2
  Filled 2017-02-04 (×2): qty 1

## 2017-02-04 MED ORDER — ALUM & MAG HYDROXIDE-SIMETH 200-200-20 MG/5ML PO SUSP: 30.0000 mL | ORAL | Status: DC | PRN

## 2017-02-04 MED ORDER — ACETAMINOPHEN 325 MG PO TABS: 650.0000 mg | ORAL_TABLET | ORAL | Status: DC | PRN
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
## 2017-02-04 NOTE — Plan of Care (Signed)
Problem: Safety: Goal: Periods of time without injury will increase Outcome: Progressing Pt. remains a low fall risk, denies SI/HI/AVH at this time, Q 15 checks in place for safety.

## 2017-02-04 NOTE — ED Triage Notes (Signed)
Pt reports SI last night , sts wanted to grab a knife this morning and hurt herself. Hx depression and other psych issues per pt.

## 2017-02-04 NOTE — Progress Notes (Signed)
Did not attend group 

## 2017-02-04 NOTE — ED Notes (Signed)
Patient transferred to [facility].  Left the unit ambulatory with [employer].  All belongings given to the driver.

## 2017-02-04 NOTE — Progress Notes (Signed)
Deborah Melendez is a 41 year old female being admitted voluntarily to 302-[facility] from [facility]. She came to the ED with suicidal ideation with a plan to cut her wrist.  She has history of Substance induced Bipolar and related disorder with onset during intoxication.  She has a history of cutting in the past and recent episode was trigger by a recent nightmare.  She is reporting increasing depressive symptoms as anhedonia, hopelessness and fatigue.  She denies HI or A/V hallucinations.  She reported regular use of cocaine and marijuana.  But hasn't used in three weeks.  She was recently at [facility] on 01/19/17.  She is diagnosed with Bipolar Disorder, depressed mood and Substance Use Disorder. She continues to voice passive SI and is able to contract for safety on the unit.  She denies HI or A/V hallucinations.  She denies any medical issues and appears to be in no physical distress. Oriented her to the unit.  Admission paperwork completed and signed.  Belongings searched and secured in locker # 55.  Skin assessment completed and noted old acne scars on face, back and chest.  Q 15 minute checks initiated for safety.  We will monitor the progress towards her goals.

## 2017-02-04 NOTE — Tx Team (Signed)
Initial Treatment Plan 02/04/2017 4:16 PM Deborah Melendez [id number]    PATIENT STRESSORS: Financial difficulties Medication change or noncompliance Substance abuse   PATIENT STRENGTHS: Curator fund of knowledge Physical Health Supportive family/friends   PATIENT IDENTIFIED PROBLEMS: Depression  Substance abuse  Suicidal ideation  Anxiety  "Get stable on my medicines"  "Stop having these thoughts so I can stop coming back"           DISCHARGE CRITERIA:  Improved stabilization in mood, thinking, and/or behavior Verbal commitment to aftercare and medication compliance  PRELIMINARY DISCHARGE PLAN: Outpatient therapy Medication management  PATIENT/FAMILY INVOLVEMENT: This treatment plan has been presented to and reviewed with the patient, Deborah Melendez.  The patient and family have been given the opportunity to ask questions and make suggestions.  Windell Moment, RN 02/04/2017, 4:16 PM

## 2017-02-04 NOTE — BH Assessment (Signed)
[place] Assessment Progress Note  Per Corena Pilgrim, MD, this pt requires psychiatric hospitalization at this time.  Letitia Libra, RN, [facility] has assigned pt to [facility] 302-2.  Pt has signed Voluntary Admission and Consent for Treatment, as well as Consent to Release Information to no one, and signed forms have been faxed to [facility].  Pt's nurse, Gerrit Friends, has been notified, and agrees to send original paperwork along with pt via [facility] [place], and to call report to [phone].  Jalene Mullet, [place] Triage Specialist [phone]

## 2017-02-05 ENCOUNTER — Inpatient Hospital Stay: Admit: 2017-02-05 | Payer: Self-pay

## 2017-02-05 DIAGNOSIS — Z818 Family history of other mental and behavioral disorders: Secondary | ICD-10-CM

## 2017-02-05 DIAGNOSIS — F410 Panic disorder [episodic paroxysmal anxiety] without agoraphobia: Secondary | ICD-10-CM

## 2017-02-05 DIAGNOSIS — Z811 Family history of alcohol abuse and dependence: Secondary | ICD-10-CM

## 2017-02-05 DIAGNOSIS — Z813 Family history of other psychoactive substance abuse and dependence: Secondary | ICD-10-CM

## 2017-02-05 DIAGNOSIS — R45851 Suicidal ideations: Secondary | ICD-10-CM

## 2017-02-05 DIAGNOSIS — Z79899 Other long term (current) drug therapy: Secondary | ICD-10-CM

## 2017-02-05 DIAGNOSIS — F19980 Other psychoactive substance use, unspecified with psychoactive substance-induced anxiety disorder: Secondary | ICD-10-CM

## 2017-02-05 DIAGNOSIS — F1220 Cannabis dependence, uncomplicated: Secondary | ICD-10-CM

## 2017-02-05 DIAGNOSIS — F314 Bipolar disorder, current episode depressed, severe, without psychotic features: Secondary | ICD-10-CM

## 2017-02-05 MED ORDER — QUETIAPINE FUMARATE ER 400 MG PO TB24
400.0000 mg | ORAL_TABLET | Freq: Every day | ORAL | Status: DC
  Administered 2017-02-05 – 2017-02-06 (×2): 400 mg via ORAL
  Filled 2017-02-05 (×4): qty 1

## 2017-02-05 MED ORDER — LOPERAMIDE HCL 2 MG PO CAPS
2.0000 mg | ORAL_CAPSULE | ORAL | Status: DC | PRN
  Administered 2017-02-05 (×2): 2 mg via ORAL
  Filled 2017-02-05 (×2): qty 1

## 2017-02-05 MED ORDER — DIVALPROEX SODIUM ER 250 MG PO TB24
750.0000 mg | ORAL_TABLET | Freq: Every day | ORAL | Status: DC
  Administered 2017-02-05 – 2017-02-06 (×2): 750 mg via ORAL
  Filled 2017-02-05 (×4): qty 3

## 2017-02-05 NOTE — Progress Notes (Addendum)
  DATA ACTION RESPONSE  Objective- Pt. is up and visible in the milieu, watching TV and talking on the phone. Pt. presents with a depressed affect and mood. Subjective- Denies having any SI/HI/AVH/Pain at this time. Pt. states " I take a lot of meds. at night". Pt. continues to be cooperative and remain safe & pleasant on the unit.  1:1 interaction in private to establish rapport. Encouragement, education, & support given from staff. Meds. ordered and administered. Pt. c/o of dry mouth; will pass on to day team to eval.   Safety maintained with Q 15 checks. Continues to follow treatment plan and will monitor closely. No additonal questions/concerns noted.

## 2017-02-05 NOTE — BHH Suicide Risk Assessment (Signed)
[place] INPATIENT:  Family/Significant Other Suicide Prevention Education  Suicide Prevention Education:  Patient Refusal for Family/Significant Other Suicide Prevention Education: The patient Deborah Melendez has refused to provide written consent for family/significant other to be provided Family/Significant Other Suicide Prevention Education during admission and/or prior to discharge.  Physician notified.  SPE completed with pt, as pt refused to consent to family contact. SPI pamphlet provided to pt and pt was encouraged to share information with support network, ask questions, and talk about any concerns relating to SPE. Pt denies access to guns/firearms and verbalized understanding of information provided. Mobile Crisis information also provided to pt.   Deborah Dewan N Smart LCSW 02/05/2017, 3:34 PM
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
## 2017-02-05 NOTE — Tx Team (Signed)
Interdisciplinary Treatment and Diagnostic Plan Update  02/05/2017 Time of Session: 9:30AM Marialis Kuder MRN: [id number]  Principal Diagnosis: Bipolar 1 disorder, depressed, severe ([place])  Secondary Diagnoses: Principal Problem:   Bipolar 1 disorder, depressed, severe ([place])   Current Medications:  Current Facility-Administered Medications  Medication Dose Route Frequency Provider Last Rate Last Dose  . acetaminophen (TYLENOL) tablet 650 mg  650 mg Oral Q6H PRN Lurena Nida, NP      . citalopram (CELEXA) tablet 20 mg  20 mg Oral Daily Lurena Nida, NP   20 mg at 02/05/17 [id number]  . divalproex (DEPAKOTE ER) 24 hr tablet 750 mg  750 mg Oral QHS Aundra Dubin, MD      . gabapentin (NEURONTIN) capsule 300 mg  300 mg Oral QID Lurena Nida, NP   300 mg at 02/05/17 [id number]  . hydrOXYzine (ATARAX/VISTARIL) tablet 50 mg  50 mg Oral Q6H PRN Lurena Nida, NP      . loperamide (IMODIUM) capsule 2 mg  2 mg Oral PRN Aundra Dubin, MD   2 mg at 02/05/17 [id number]  . QUEtiapine (SEROQUEL XR) 24 hr tablet 400 mg  400 mg Oral QHS Aundra Dubin, MD       PTA Medications: Prescriptions Prior to Admission  Medication Sig Dispense Refill Last Dose  . citalopram (CELEXA) 20 MG tablet Take 1 tablet (20 mg total) by mouth daily. For depression 30 tablet 0 02/03/2017 at Unknown time  . divalproex (DEPAKOTE) 250 MG DR tablet Take 1 tablet (250 mg total) by mouth every 8 (eight) hours. For mood stabilization 90 tablet 0 02/03/2017 at Unknown time  . gabapentin (NEURONTIN) 300 MG capsule Take 1 capsule (300 mg total) by mouth 4 (four) times daily. For agitation 120 capsule 0 02/03/2017 at Unknown time  . hydrOXYzine (ATARAX/VISTARIL) 50 MG tablet Take 1 tablet (50 mg total) by mouth every 6 (six) hours as needed for anxiety. 60 tablet 0 02/03/2017 at Unknown time  . nicotine (NICODERM CQ - DOSED IN MG/24 HOURS) 21 mg/24hr patch Place 1 patch (21 mg total) onto the skin daily. For smoking cessation (Patient not  taking: Reported on 02/04/2017) 28 patch 0 Not Taking at Unknown time  . QUEtiapine (SEROQUEL) 25 MG tablet Take 1 tablet (25 mg total) by mouth 3 (three) times daily as needed (severe anxiety/agitation). 90 tablet 0 02/03/2017 at Unknown time  . QUEtiapine (SEROQUEL) 400 MG tablet Take 1 tablet (400 mg total) by mouth at bedtime. For mood control 30 tablet 0 02/03/2017 at Unknown time  . traZODone (DESYREL) 100 MG tablet Take 1 tablet (100 mg total) by mouth at bedtime as needed for sleep. 30 tablet 0 02/03/2017 at Unknown time    Patient Stressors: Financial difficulties Medication change or noncompliance Substance abuse  Patient Strengths: Curator fund of knowledge Physical Health Supportive family/friends  Treatment Modalities: Medication Management, Group therapy, Case management,  1 to 1 session with clinician, Psychoeducation, Recreational therapy.   Physician Treatment Plan for Primary Diagnosis: Bipolar 1 disorder, depressed, severe ([place]) Long Term Goal(s): Improvement in symptoms so as ready for discharge Improvement in symptoms so as ready for discharge   Short Term Goals: Ability to identify changes in lifestyle to reduce recurrence of condition will improve Ability to demonstrate self-control will improve Compliance with prescribed medications will improve Ability to identify triggers associated with substance abuse/mental health issues will improve Ability to identify changes in lifestyle to reduce recurrence of condition will improve  Ability to disclose and discuss suicidal ideas Ability to demonstrate self-control will improve Compliance with prescribed medications will improve Ability to identify triggers associated with substance abuse/mental health issues will improve  Medication Management: Evaluate patient's response, side effects, and tolerance of medication regimen.  Therapeutic Interventions: 1 to 1 sessions, Unit Group sessions and Medication  administration.  Evaluation of Outcomes: Progressing  Physician Treatment Plan for Secondary Diagnosis: Principal Problem:   Bipolar 1 disorder, depressed, severe ([place])  Long Term Goal(s): Improvement in symptoms so as ready for discharge Improvement in symptoms so as ready for discharge   Short Term Goals: Ability to identify changes in lifestyle to reduce recurrence of condition will improve Ability to demonstrate self-control will improve Compliance with prescribed medications will improve Ability to identify triggers associated with substance abuse/mental health issues will improve Ability to identify changes in lifestyle to reduce recurrence of condition will improve Ability to disclose and discuss suicidal ideas Ability to demonstrate self-control will improve Compliance with prescribed medications will improve Ability to identify triggers associated with substance abuse/mental health issues will improve     Medication Management: Evaluate patient's response, side effects, and tolerance of medication regimen.  Therapeutic Interventions: 1 to 1 sessions, Unit Group sessions and Medication administration.  Evaluation of Outcomes: Progressing   RN Treatment Plan for Primary Diagnosis: Bipolar 1 disorder, depressed, severe ([place]) Long Term Goal(s): Knowledge of disease and therapeutic regimen to maintain health will improve  Short Term Goals: Ability to remain free from injury will improve, Ability to verbalize feelings will improve and Ability to disclose and discuss suicidal ideas  Medication Management: RN will administer medications as ordered by provider, will assess and evaluate patient's response and provide education to patient for prescribed medication. RN will report any adverse and/or side effects to prescribing provider.  Therapeutic Interventions: 1 on 1 counseling sessions, Psychoeducation, Medication administration, Evaluate responses to treatment, Monitor vital  signs and CBGs as ordered, Perform/monitor CIWA, COWS, AIMS and Fall Risk screenings as ordered, Perform wound care treatments as ordered.  Evaluation of Outcomes: Progressing   LCSW Treatment Plan for Primary Diagnosis: Bipolar 1 disorder, depressed, severe ([place]) Long Term Goal(s): Safe transition to appropriate next level of care at discharge, Engage patient in therapeutic group addressing interpersonal concerns.  Short Term Goals: Engage patient in aftercare planning with referrals and resources, Facilitate patient progression through stages of change regarding substance use diagnoses and concerns and Identify triggers associated with mental health/substance abuse issues  Therapeutic Interventions: Assess for all discharge needs, 1 to 1 time with Social worker, Explore available resources and support systems, Assess for adequacy in community support network, Educate family and significant other(s) on suicide prevention, Complete Psychosocial Assessment, Interpersonal group therapy.  Evaluation of Outcomes: Progressing   Progress in Treatment: Attending groups: No. New to unit. Continuing to assess.  Participating in groups: No. Taking medication as prescribed: Yes. Toleration medication: Yes. Family/Significant other contact made: No, will contact:  family member if patient consents. Patient understands diagnosis: Yes. Discussing patient identified problems/goals with staff: Yes. Medical problems stabilized or resolved: Yes. Denies suicidal/homicidal ideation: No. Passive SI/Able to contract for safety on the unit.  Issues/concerns per patient self-inventory: No. Other: n/a  New problem(s) identified: No, Describe:  n/a  New Short Term/Long Term Goal(s): medication stabilization; development of comprehensive mental wellness plan.   Discharge Plan or Barriers: CSW assessing. During last admission, pt referred to [facility] screening-did not go. Pt also referred to  [facility] for outpatient mental  health services.   Reason for Continuation of Hospitalization: Depression Medication stabilization Suicidal ideation  Estimated Length of Stay: 3-5 days   Attendees: Patient: 02/05/2017 1:10 PM  Physician: Dr. Daron Offer MD 02/05/2017 1:10 PM  Nursing: Linna Hoff RN; [place] RN 02/05/2017 1:10 PM  RN Care Manager: Lars Pinks CM 02/05/2017 1:10 PM  Social Worker: Press photographer, LCSW 02/05/2017 1:10 PM  Recreational Therapist: x 02/05/2017 1:10 PM  Other: Lindell Spar NP; Ricky Ala NP 02/05/2017 1:10 PM  Other:  02/05/2017 1:10 PM  Other: 02/05/2017 1:10 PM    Scribe for Treatment Team: [place], LCSW 02/05/2017 1:10 PM

## 2017-02-05 NOTE — BHH Suicide Risk Assessment (Signed)
[facility] Admission Suicide Risk Assessment   Nursing information obtained from:  Patient Demographic factors:  Unemployed Current Mental Status:  Suicidal ideation indicated by patient Loss Factors:  NA Historical Factors:  Prior suicide attempts, Family history of mental illness or substance abuse Risk Reduction Factors:  Sense of responsibility to family, Living with another person, especially a relative  Total Time spent with patient: 1 hour Principal Problem: Bipolar 1 disorder, depressed, severe ([place]) Diagnosis:   Patient Active Problem List   Diagnosis Date Noted  . Bipolar 1 disorder, depressed, severe ([place]) [F31.4] 02/04/2017  . Panic attacks [F41.0] 01/21/2017  . 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 Cannabis use disorder, severe, dependence ([place]) [F12.20] 01/20/2017  . MDD (major depressive disorder), recurrent severe, without psychosis ([place]) [F33.2] 08/08/2015  . Cocaine use disorder, severe, dependence ([place]) [F14.20] 08/08/2015  . Cigar smoker motivated to quit [F17.290] 07/06/2014   Subjective Data: See H&P for full details   Continued Clinical Symptoms:  Alcohol Use Disorder Identification Test Final Score (AUDIT): 3 The "Alcohol Use Disorders Identification Test", Guidelines for Use in Primary Care, Second Edition.  [facility]). Score between 0-7:  no or low risk or alcohol related problems. Score between 8-15:  moderate risk of alcohol related problems. Score between 16-19:  high risk of alcohol related problems. Score 20 or above:  warrants further diagnostic evaluation for alcohol dependence and treatment.   CLINICAL FACTORS:   Bipolar Disorder:   Depressive phase   Musculoskeletal: Strength & Muscle Tone: within normal limits Gait & Station: normal Patient leans: N/A  Psychiatric Specialty Exam: Physical Exam  ROS See H&P for full details   Blood pressure  107/75, pulse (!) 105, temperature 98.8 F (37.1 C), temperature source Oral, resp. rate 16, height 5\' 2"  (1.575 m), weight 60.3 kg (133 lb), last menstrual period 01/21/2017, SpO2 100 %.Body mass index is 24.33 kg/m.   See H&P for full details of mental status exam by this writer   COGNITIVE FEATURES THAT CONTRIBUTE TO RISK:  None    SUICIDE RISK:   Moderate:  Frequent suicidal ideation with limited intensity, and duration, some specificity in terms of plans, no associated intent, good self-control, limited dysphoria/symptomatology, some risk factors present, and identifiable protective factors, including available and accessible social support.  PLAN OF CARE: See H&P for full details  I certify that inpatient services furnished can reasonably be expected to improve the patient's condition.   Aundra Dubin, MD 02/05/2017, 12:48 PM

## 2017-02-05 NOTE — BHH Group Notes (Signed)
[place] LCSW Group Therapy  02/05/2017 3:41 PM  Type of Therapy:  Group Therapy  Participation Level:  Did Not Attend-pt invited. Stayed in corner of room for a few minutes at the end but did not join group discussion when prompted. Pt presents as depressed/flat but when engaged 1:1, brightens.   Summary of Progress/Problems: Self Sabotage members were invited to explore various examples of self sabatoge and how this behavior affects their recovery and potential for relapse. Group members were asked to identify examples of self sabatoge in their most recent mental health/substance abuse relapse  [name] LCSW 02/05/2017, 3:41 PM

## 2017-02-05 NOTE — Progress Notes (Signed)
Did not attend AA group meeting.

## 2017-02-05 NOTE — Progress Notes (Signed)
  DATA ACTION RESPONSE  Objective- Pt. is up and visible in the dayroom, sitting quietly. Pt. presents with a depressed affect and mood. Subjective- Denies having any SI/HI/AVH/Pain at this time. Pt. states "Talking on the phone to my fiance earlier made me mad so I hung up". Pt. continues to be cooperative and remain safe & pleasant  on the unit.  1:1 interaction in private to establish rapport. Encouragement, education, & support given from staff. Meds. ordered and administered.   Safety maintained with Q 15 checks. Continues to follow treatment plan and will monitor closely. No additonal questions/concerns noted.

## 2017-02-05 NOTE — Progress Notes (Signed)
Recreation Therapy Notes  Date: 02/05/17 Time: 0930 Location: [address] Dayroom  Group Topic: Stress Management  Goal Area(s) Addresses:  Patient will verbalize importance of using healthy stress management.  Patient will identify positive emotions associated with healthy stress management.   Intervention: Stress Management  Activity :  Peaceful Place.  LRT introduced the stress management technique of guided imagery.  LRT read a script to allow patients to engage in a "mental vacation".  Patients were to follow along as LRT read script.  Education:  Stress Management, Discharge Planning.   Education Outcome: Acknowledges edcuation/In group clarification offered/Needs additional education  Clinical Observations/Feedback: Pt did not attend group.   Victorino Sparrow, LRT/CTRS         Victorino Sparrow A 02/05/2017 12:09 PM
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
## 2017-02-05 NOTE — Plan of Care (Signed)
Problem: Medication: Goal: Compliance with prescribed medication regimen will improve Outcome: Progressing Pt is taking meds as prescribed.    

## 2017-02-05 NOTE — Progress Notes (Signed)
Nursing Note 02/05/2017 [id number]  Data Reports sleeping poor with PRN sleep med.  Rates depression 7/10, hopelessness 8/10, and anxiety 4/10. Affect appropriate. Denies HI, SI, AVH.  Patient reported loose stools x6 in 24 hours this AM- no nausea, no fever. VSS.  Action Spoke with patient 1:1, nurse offered support to patient throughout shift.  Notified MD, spoke with infection control about loose stools- decided not to put patient on contact- patient educated on handwashing every time she leaves her room and uses the rest room per infection control.  Fluids encouraged. Given immodium 4mg  x1. Continues to be monitored on 15 minute checks for safety.  Response Imodium effective. Patient remains safe and appropriate on unit.

## 2017-02-06 MED ORDER — IBUPROFEN 600 MG PO TABS
600.0000 mg | ORAL_TABLET | Freq: Four times a day (QID) | ORAL | Status: DC | PRN
  Administered 2017-02-06: 600 mg via ORAL
  Filled 2017-02-06: qty 1

## 2017-02-06 MED ORDER — TRAZODONE HCL 50 MG PO TABS: 50.0000 mg | ORAL_TABLET | Freq: Every evening | ORAL | Status: DC | PRN

## 2017-02-06 MED ORDER — TRAZODONE HCL 50 MG PO TABS
50.0000 mg | ORAL_TABLET | Freq: Every evening | ORAL | Status: DC | PRN
  Administered 2017-02-06: 50 mg via ORAL
  Filled 2017-02-06: qty 1

## 2017-02-06 NOTE — Progress Notes (Signed)
Nursing Note 02/06/2017 [id number]  Data Reports sleeping poor with PRN sleep med stating "it works but I found myself waking up at 3 & 5."  Rates depression 8/10, hopelessness 10/10, and anxiety 10/10. Affect appropriate.  Denies HI, SI, AVH.  C/O knee pain throughout day. Patient very upset this AM, crying, approached nursing station asking for medicine, did not wish to talk.  Nurse met with patient shortly after she left med window and asked her what was wrong.  While sobbing and crying stated "my momma don't believe in me anymore."   Action Spoke with patient 1:1, nurse offered support to patient throughout shift. PRN's/heat given for knee pain and anxiety per MAR.  Spoke with patient after meds given, emotional support given.  Patient and nurse set goal to direct energy towards "proving her (patient's mom) wrong and recovering."  Patient stated she was OK and nurse said it was OK to cry and stay in her room, nurse got patient up at 1030AM  As we agreed. Continues to be monitored on 15 minute checks for safety.  Response PRN's effective.  Insightful after interaction this AM stating "I put myself in her (her mother's) shoes and I can see why she doesn't trust me. I've lied to her, and I've been doing this for so long." Verbalized understanding that trust takes time to rebuild.  Patient remains safe and appropriate.

## 2017-02-06 NOTE — Progress Notes (Signed)
[facility] Group Notes:  (Nursing/MHT/Case Management/Adjunct)  Date:  02/06/2017  Time:  10:34 PM  Type of Therapy:  Psychoeducational Skills  Participation Level:  Active  Participation Quality:  Appropriate  Affect:  Excited  Cognitive:  Disorganized  Insight:  Appropriate  Engagement in Group:  Developing/Improving  Modes of Intervention:  Education  Summary of Progress/Problems: The patient verbalized in group that she had a bad day since she experienced multiple "panic attacks" and "anxiety attacks". The patient's goal for tomorrow is to have fewer panic attacks.   Archie Balboa S 02/06/2017, 10:34 PM

## 2017-02-06 NOTE — [facility] Group Notes (Signed)
[facility] Group Notes:  (Nursing/MHT/Case Management/Adjunct)  Date:  02/06/2017  Time:  2:20 PM  Type of Therapy:  Nurse Education  Participation Level:  Did Not Lennette Bihari 02/06/2017, 2:20 PM

## 2017-02-06 NOTE — [facility] Group Notes (Signed)
[facility] LCSW Group Therapy Note  02/06/2017  and  10:00 AM  Type of Therapy and Topic:  Group Therapy: Avoiding Self-Sabotaging and Engaging in Radical Acceptance  Participation Level:  Minimal  Participation Quality:  Sharing  Affect:  Anxious  Cognitive:  Alert and Oriented  Insight:  Limited  Engagement in Therapy:  None   Therapeutic models used: Cognitive Behavioral Therapy,  Person-Centered Therapy and Motivational Interviewing  Modes of Intervention:  Discussion, Exploration, Orientation, Rapport Building, Socialization and Support   Summary of Progress/Problems:  The main focus of today's process group was for the patient to identify ways in which they may be able to engage in Radical Acceptance verses self sabotage. Motivational Interviewing was utilized to identify motivation they may have for wanting to change. The group processed multiple fears associated with change including the fear of success. Patient came in near closing of group and was focused on smell of EXPO writer being used and shared it was a trigger for her. Pt was unable to process how she might respond to triggers differently.   Sheilah Pigeon, LCSW

## 2017-02-06 NOTE — Progress Notes (Signed)
[facility] MD Progress Note  02/06/2017 12:33 PM Deborah Melendez  MRN:  [id number]  Subjective: Patient states " I need mental help, I was up at 3 am standing looking out the window"  Objective: Deborah Melendez is awake, alert and oriented *3. Seen resting in bedroom. Denies suicidal or homicidal ideation. Denies auditory or visual hallucination and does not appear to be responding to internal stimuli. Patient reports she is unable to rest well at night. Patient reports she is medication compliant without mediation side effects. States her depression 0/10 during this assessment. Reports a fair appetite and states she is resting well. Support, encouragement and reassurance was provided.   Principal Problem: Bipolar 1 disorder, depressed, severe ([place]) : R/O bipolar disorder   Diagnosis:   Patient Active Problem List   Diagnosis Date Noted  . Bipolar 1 disorder, depressed, severe ([place]) [F31.4] 02/04/2017  . Panic attacks [F41.0] 01/21/2017  . 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 Cannabis use disorder, severe, dependence ([place]) [F12.20] 01/20/2017  . MDD (major depressive disorder), recurrent severe, without psychosis ([place]) [F33.2] 08/08/2015  . Cocaine use disorder, severe, dependence ([place]) [F14.20] 08/08/2015  . Cigar smoker motivated to quit [F17.290] 07/06/2014   Total Time spent with patient: 30 minutes  Past Psychiatric History: Please see H&P.   Past Medical History:  Past Medical History:  Diagnosis Date  . MRSA (methicillin resistant Staphylococcus aureus)    surgery on finger 3 years ago  . Substance or medication-induced bipolar and related disorder with onset during intoxication ([place]) 09/08/2016    Past Surgical History:  Procedure Laterality Date  . FINGER SURGERY     Family History:  Family History  Problem Relation Age of Onset  . Diabetes Mother   . Hypertension Mother   . Drug abuse  Father   . Schizophrenia Maternal Aunt    Family Psychiatric  History: Please see H&P.  Social History: Please see H&P.  History  Alcohol Use  . Yes    Comment: occasional      History  Drug Use No    Comment: 30 days sober, just completed treatment at [facility] 8/11    Social History   Social History  . Marital status: Single    Spouse name: N/A  . Number of children: N/A  . Years of education: N/A   Social History Main Topics  . Smoking status: Current Every Day Smoker    Packs/day: 0.50    Types: Cigarettes  . Smokeless tobacco: Never Used     Comment: pt reported quiting three weeks ago  . Alcohol use Yes     Comment: occasional   . Drug use: No     Comment: 30 days sober, just completed treatment at [facility] 8/11  . Sexual activity: Yes    Birth control/ protection: None   Other Topics Concern  . None   Social History Narrative  . None   Additional Social History:   Sleep: Good  Appetite:  Fair  Current Medications: Current Facility-Administered Medications  Medication Dose Route Frequency Provider Last Rate Last Dose  . acetaminophen (TYLENOL) tablet 650 mg  650 mg Oral Q6H PRN Lurena Nida, NP   650 mg at 02/06/17 1047  . citalopram (CELEXA) tablet 20 mg  20 mg Oral Daily Lurena Nida, NP   20 mg at 02/06/17 0813  . divalproex (DEPAKOTE ER) 24 hr tablet 750 mg  750 mg Oral QHS Aundra Dubin, MD  750 mg at 02/05/17 2143  . gabapentin (NEURONTIN) capsule 300 mg  300 mg Oral QID Lurena Nida, NP   300 mg at 02/06/17 0813  . hydrOXYzine (ATARAX/VISTARIL) tablet 50 mg  50 mg Oral Q6H PRN Lurena Nida, NP   50 mg at 02/06/17 0813  . ibuprofen (ADVIL,MOTRIN) tablet 600 mg  600 mg Oral Q6H PRN Derrill Center, NP      . loperamide (IMODIUM) capsule 2 mg  2 mg Oral PRN Aundra Dubin, MD   2 mg at 02/05/17 [id number]  . QUEtiapine (SEROQUEL XR) 24 hr tablet 400 mg  400 mg Oral QHS Aundra Dubin, MD   400 mg at 02/05/17 2143   Lab Results:  No  results found for this or any previous visit (from the past 48 hour(s)). Blood Alcohol level:  Lab Results  Component Value Date   ETH <5 02/04/2017   ETH <5 [id number]   Metabolic Disorder Labs: Lab Results  Component Value Date   HGBA1C 5.4 01/21/2017   MPG 108 01/21/2017   No results found for: PROLACTIN Lab Results  Component Value Date   CHOL 153 01/21/2017   TRIG 89 01/21/2017   HDL 58 01/21/2017   CHOLHDL 2.6 01/21/2017   VLDL 18 01/21/2017   LDLCALC 77 01/21/2017   Physical Findings: AIMS: Facial and Oral Movements Muscles of Facial Expression: None, normal Lips and Perioral Area: None, normal Jaw: None, normal Tongue: None, normal,Extremity Movements Upper (arms, wrists, hands, fingers): None, normal Lower (legs, knees, ankles, toes): None, normal, Trunk Movements Neck, shoulders, hips: None, normal, Overall Severity Severity of abnormal movements (highest score from questions above): None, normal Incapacitation due to abnormal movements: None, normal Patient's awareness of abnormal movements (rate only patient's report): No Awareness, Dental Status Current problems with teeth and/or dentures?: No Does patient usually wear dentures?: No  CIWA:    COWS:     Musculoskeletal: Strength & Muscle Tone: within normal limits Gait & Station: normal Patient leans: N/A  Psychiatric Specialty Exam: Physical Exam  Nursing note and vitals reviewed. Musculoskeletal: Normal range of motion.  Neurological: She is alert.  Psychiatric: She has a normal mood and affect. Her behavior is normal.    Review of Systems  Psychiatric/Behavioral: Positive for depression and suicidal ideas. The patient is nervous/anxious and has insomnia.   All other systems reviewed and are negative.   Blood pressure 109/77, pulse 97, temperature 98.7 F (37.1 C), resp. rate 18, height 5\' 2"  (1.575 m), weight 60.3 kg (133 lb), last menstrual period 01/21/2017, SpO2 100 %.Body mass index is 24.33  kg/m.  General Appearance: Guarded  Eye Contact:  Fair  Speech:  Normal Rate  Volume:  Normal  Mood:  Anxious, Depressed and Irritable  Affect:  Labile  Thought Process:  Goal Directed and Descriptions of Associations: Circumstantial  Orientation:  Full (Time, Place, and Person)  Thought Content:  Hallucinations: None and Rumination  Suicidal Thoughts:  No   Homicidal Thoughts:  No  Memory:  Immediate;   Fair Recent;   Fair Remote;   Fair  Judgement:  Poor  Insight:  Shallow  Psychomotor Activity:  Restlessness  Concentration:  Concentration: Poor and Attention Span: Poor  Recall:  [employer] of Knowledge:  Fair  Language:  Fair  Akathisia:  No  Handed:  Right  AIMS (if indicated):     Assets:  Communication Skills Desire for Improvement Resilience Social Support  ADL's:  Intact  Cognition:  WNL  Sleep:  Number of Hours: 6.25     I agree with current treatment plan on 02/10//2018, Patient seen face-to-face for psychiatric evaluation follow-up, chart reviewed. Reviewed the information documented and agree with the treatment plan.  Treatment Plan Summary: Daily contact with patient to assess and evaluate symptoms and progress in treatment and Medication management  Started on Biotene PRN for drymouth Bipolar 1 disorder, depressed, severe ([place]) unstable  Will continue today 02/06/17  plan as below except where it is noted.  For affective sx: Will continue Celexa 20 mg po daily.  For mood lability: Will Depakote 750 mg for mood stabilization Will continue Gabapentin 300 mg po qid. Will continue Seroquel 400 mg po qhs.  For insomnia: Will continue Seroquel 400 mg po q hs, adjusted dosing time to 2100 to give patient more time to sleep at night . -  Start PRN trazodone 50 mg Will continue to monitor vitals ,medication compliance and treatment side effects while patient is here.   Will monitor for medical issues as well as call consult as needed.   Reviewed labs  tsh - wnl, uds pos for cocaine, thc ,  Hba1c x 2 weeks ago, result reviewed, within normal CSW will continue working on disposition.  Patient to participate in therapeutic milieu .   Derrill Center, NP 02/06/2017, 12:33 PM

## 2017-02-07 DIAGNOSIS — F17210 Nicotine dependence, cigarettes, uncomplicated: Secondary | ICD-10-CM

## 2017-02-07 MED ORDER — LORAZEPAM 1 MG PO TABS
1.0000 mg | ORAL_TABLET | Freq: Once | ORAL | Status: AC
  Administered 2017-02-07: 1 mg via ORAL

## 2017-02-07 MED ORDER — LORAZEPAM 1 MG PO TABS
ORAL_TABLET | ORAL | Status: AC
  Filled 2017-02-07: qty 1

## 2017-02-07 NOTE — Progress Notes (Signed)
Farris Has, NP and Dr. Dwyane Dee said that patient wanted to leave AMA and that they would discharge her AMA; Patient signed AMA form and was advised or risks of leaving against medical advice.

## 2017-02-07 NOTE — Progress Notes (Signed)
D.  Pt anxious on approach, states she has had multiple panic attacks today and could not remain asleep last night.  Pt was positive for evening wrap up group, observed interacting appropriately with peers on the unit.  Pt denies SI/HI/hallucinations at this time.  A.  Support and encouragement offered, medications given as ordered  R.  Pt remains safe on the unit, will continue to monitor.

## 2017-02-07 NOTE — Plan of Care (Signed)
Problem: Safety: Goal: Ability to remain free from injury will improve Outcome: Progressing Safety maintained on unit  Problem: Medication: Goal: Compliance with prescribed medication regimen will improve Outcome: Progressing Patient compliant with medication regimen  Problem: Self-Concept: Goal: Ability to disclose and discuss suicidal ideas will improve Outcome: Progressing Patient denies suicidal ideation

## 2017-02-07 NOTE — BHH Suicide Risk Assessment (Signed)
[facility] Discharge Suicide Risk Assessment   Principal Problem: Bipolar 1 disorder, depressed, severe ([place]) Discharge Diagnoses:  Patient Active Problem List   Diagnosis Date Noted  . Bipolar 1 disorder, depressed, severe ([place]) [F31.4] 02/04/2017  . Panic attacks [F41.0] 01/21/2017  . 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 Cannabis use disorder, severe, dependence ([place]) [F12.20] 01/20/2017  . MDD (major depressive disorder), recurrent severe, without psychosis ([place]) [F33.2] 08/08/2015  . Cocaine use disorder, severe, dependence ([place]) [F14.20] 08/08/2015  . Cigar smoker motivated to quit [F17.290] 07/06/2014   Patient is a 41 year old female diagnosed with bipolar 1 disorder, with substantiated manic episodes in the past and substance use disorder currently in early remission.  Patient states that she is doing better this morning, adds that she is taking her medications as prescribed, would like to be discharged. Patient states that she does not feel she needs to be in the hospital as she is doing fine now. On being questioned about any symptoms of mania, patient reports that she feels a little irritable but denies any symptoms of racing thoughts, suicidal ideation, any decreased need for sleep. She also denies feeling depressed, denies any suicidal or homicidal ideation. She states that she wants to see a dermatologist outpatient, plans to keep her outpatient appointments and take her medications as prescribed Total Time spent with patient: 30 minutes  Musculoskeletal: Strength & Muscle Tone: within normal limits Gait & Station: normal Patient leans: N/A  Psychiatric Specialty Exam: Review of Systems  Constitutional: Negative.  Negative for chills, fever and malaise/fatigue.  HENT: Negative.  Negative for congestion, hearing loss and sore throat.   Eyes: Negative.  Negative for blurred vision, double vision,  discharge and redness.  Respiratory: Negative.  Negative for cough, shortness of breath and wheezing.   Cardiovascular: Negative.  Negative for chest pain and palpitations.  Gastrointestinal: Negative.  Negative for abdominal pain, constipation, diarrhea, heartburn, nausea and vomiting.  Genitourinary: Negative.  Negative for dysuria.  Musculoskeletal: Negative.  Negative for myalgias.  Skin: Negative for itching and rash.       Hyperpigmentation scars  Neurological: Negative.  Negative for dizziness, focal weakness, seizures and loss of consciousness.  Endo/Heme/Allergies: Negative.  Negative for environmental allergies. Does not bruise/bleed easily.  Psychiatric/Behavioral: Negative for depression, hallucinations, memory loss, substance abuse and suicidal ideas. The patient is not nervous/anxious and does not have insomnia.     Blood pressure 102/68, pulse (!) 106, temperature 97.6 F (36.4 C), resp. rate 18, height 5\' 2"  (1.575 m), weight 60.3 kg (133 lb), last menstrual period 01/21/2017, SpO2 100 %.Body mass index is 24.33 kg/m.  General Appearance: Casual  Eye Contact::  Good  Speech:  Clear and Coherent and Normal Rate409  Volume:  Normal  Mood:  Irritable  Affect:  Congruent and Full Range  Thought Process:  Coherent, Goal Directed and Descriptions of Associations: Intact  Orientation:  Full (Time, Place, and Person)  Thought Content:  WDL  Suicidal Thoughts:  No  Homicidal Thoughts:  No  Memory:  Immediate;   Fair Recent;   Fair Remote;   Fair  Judgement:  Impaired  Insight:  Shallow  Psychomotor Activity:  Restlessness  Concentration:  Fair  Recall:  [place]: Fair  Akathisia:  No  Handed:  Right  AIMS (if indicated):     Assets:  Desire for Improvement  Sleep:  Number of Hours: 5.75  Cognition: WNL  ADL's:  Intact   Mental Status Per Nursing Assessment::   On Admission:  Suicidal ideation indicated by patient  Demographic  Factors:  Low socioeconomic status and Unemployed  Loss Factors: NA  Historical Factors: Prior suicide attempts and Family history of mental illness or substance abuse  Risk Reduction Factors:   Sense of responsibility to family and Living with another person, especially a relative  Continued Clinical Symptoms:  More than one psychiatric diagnosis  Cognitive Features That Contribute To Risk:  Closed-mindedness    Suicide Risk:  Minimal: No identifiable suicidal ideation.  Patients presenting with no risk factors but with morbid ruminations; may be classified as minimal risk based on the severity of the depressive symptoms  Follow-up Information    [facility] Follow up.   Why:  Walk in for assessment within 7 days of hospital discharge. Walk in times: Monday, Wednesday, Fridays between 12:30PM-3:00PM. Thank you.  Contact information: [address] [place], [place] [address] Phone: [phone] Fax: [phone]          Plan Of Care/Follow-up recommendations:  Activity:  As tolerated Diet:  Regular Other:  To take medications as prescribed and keep follow-up appointments Discussed in length with patient that she would benefit staying today so that all her appointments could be made for her, patient states that she is okay with getting phone numbers for her to call for outpatient appointments but feels that she is ready for discharge today. Crisis and safety plan was discussed in length with patient and also discussed with patient to follow-up with [facility] Hampton Abbot, MD 02/07/2017, 1:45 PM
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
## 2017-02-07 NOTE — Progress Notes (Signed)
Written/verbal discharge instructions and follow-up infomration given to patient with verbalization of understanding;  Patient denies suicidal and homicidal ideation. Suicide Prevention information/materials given to patient  All patient belongings returned to patient at time of discharge. Discharged home in stable condition.

## 2017-02-07 NOTE — [facility] Counselor (Signed)
At patient's request, CSW provided clothing to patient from the clothing closet, including shirt and pants.  Berlin Hun Grossman-Orr 02/07/2017 4:16 PM

## 2017-02-07 NOTE — Progress Notes (Signed)
  [facility] Adult Case Management Discharge Plan :  Will you be returning to the same living situation after discharge:  Yes,  with significant other At discharge, do you have transportation home?: Yes,  arranged Do you have the ability to pay for your medications: Yes,  no barriers  Release of information consent forms completed and turned in to Medical Records.  Patient to Follow up at: Follow-up Information    [facility].   Why:  Walk in for assessment within 7 days of hospital discharge. Walk in times: Monday, Wednesday, Fridays between 12:30PM-3:00PM. Thank you.  Contact information: [address] [place], [place] [address] Phone: [phone] Fax: [phone]          Next level of care provider has access to [place] and Suicide Prevention discussed: Yes,  with pt only, refused with other  Have you used any form of tobacco in the last 30 days? (Cigarettes, Smokeless Tobacco, Cigars, and/or Pipes): Yes  Has patient been referred to the Quitline?: Patient refused referral  Patient has been referred for addiction treatment: Yes  Maretta Los 02/07/2017, 4:14 PM

## 2017-02-07 NOTE — Progress Notes (Signed)
D:  Patient awake and alert; affect flat; mood anxious/irritable; she denies suicidal and homicidal ideation and AVH; no self-injurious behaviors noted or reported. A:  Medications given as scheduled;  Emotional support provided; encouraged her to seek assistance with needs/concerns. R:  Safety maintained on unit.

## 2017-02-10 ENCOUNTER — Emergency Department (HOSPITAL_COMMUNITY)
Admission: EM | Admit: 2017-02-10 | Discharge: 2017-02-10 | Disposition: A | Discharge status: Home / Self Care | Payer: Self-pay | Attending: Emergency Medicine | Admitting: Emergency Medicine

## 2017-02-10 ENCOUNTER — Encounter (HOSPITAL_COMMUNITY): Payer: Self-pay | Admitting: *Deleted

## 2017-02-10 DIAGNOSIS — F1420 Cocaine dependence, uncomplicated: Secondary | ICD-10-CM | POA: Diagnosis present

## 2017-02-10 DIAGNOSIS — Z79899 Other long term (current) drug therapy: Secondary | ICD-10-CM | POA: Insufficient documentation

## 2017-02-10 DIAGNOSIS — F17210 Nicotine dependence, cigarettes, uncomplicated: Secondary | ICD-10-CM | POA: Insufficient documentation

## 2017-02-10 DIAGNOSIS — F4321 Adjustment disorder with depressed mood: Secondary | ICD-10-CM

## 2017-02-10 LAB — RAPID URINE DRUG SCREEN, HOSP PERFORMED
AMPHETAMINES: NOT DETECTED
BARBITURATES: NOT DETECTED
Benzodiazepines: NOT DETECTED
COCAINE: NOT DETECTED
Opiates: NOT DETECTED
TETRAHYDROCANNABINOL: NOT DETECTED

## 2017-02-10 LAB — CBC
HEMATOCRIT: 32.7 % — AB (ref 36.0–46.0)
HEMOGLOBIN: 10.8 g/dL — AB (ref 12.0–15.0)
MCH: 29.3 pg (ref 26.0–34.0)
MCHC: 33 g/dL (ref 30.0–36.0)
MCV: 88.9 fL (ref 78.0–100.0)
Platelets: 198 10*3/uL (ref 150–400)
RBC: 3.68 MIL/uL — AB (ref 3.87–5.11)
RDW: 16.9 % — ABNORMAL HIGH (ref 11.5–15.5)
WBC: 8.4 10*3/uL (ref 4.0–10.5)

## 2017-02-10 LAB — COMPREHENSIVE METABOLIC PANEL
ALBUMIN: 3.5 g/dL (ref 3.5–5.0)
ALK PHOS: 41 U/L (ref 38–126)
ALT: 19 U/L (ref 14–54)
AST: 24 U/L (ref 15–41)
Anion gap: 6 (ref 5–15)
BUN: 13 mg/dL (ref 6–20)
CALCIUM: 8.9 mg/dL (ref 8.9–10.3)
CO2: 27 mmol/L (ref 22–32)
CREATININE: 0.88 mg/dL (ref 0.44–1.00)
Chloride: 103 mmol/L (ref 101–111)
GFR calc Af Amer: >60 mL/min (ref >60)
GFR calc non Af Amer: >60 mL/min (ref >60)
GLUCOSE: 55 mg/dL — AB (ref 65–99)
Potassium: 4.1 mmol/L (ref 3.5–5.1)
SODIUM: 136 mmol/L (ref 135–145)
Total Bilirubin: 0.6 mg/dL (ref 0.3–1.2)
Total Protein: 6.8 g/dL (ref 6.5–8.1)

## 2017-02-10 LAB — ETHANOL: Alcohol, Ethyl (B): <5 mg/dL (ref <5)

## 2017-02-10 LAB — ACETAMINOPHEN LEVEL

## 2017-02-10 LAB — SALICYLATE LEVEL: Salicylate Lvl: <7 mg/dL (ref 2.8–30.0)

## 2017-02-10 MED ORDER — HYDROXYZINE HCL 25 MG PO TABS: 50.0000 mg | ORAL_TABLET | Freq: Four times a day (QID) | ORAL | Status: DC | PRN

## 2017-02-10 MED ORDER — CITALOPRAM HYDROBROMIDE 20 MG PO TABS: 20.0000 mg | ORAL_TABLET | Freq: Every day | ORAL | 0 refills | Status: DC

## 2017-02-10 MED ORDER — QUETIAPINE FUMARATE 300 MG PO TABS: 400.0000 mg | ORAL_TABLET | Freq: Every day | ORAL | Status: DC

## 2017-02-10 MED ORDER — HYDROXYZINE HCL 50 MG PO TABS: 50.0000 mg | ORAL_TABLET | Freq: Four times a day (QID) | ORAL | 0 refills | Status: DC | PRN

## 2017-02-10 MED ORDER — TRAZODONE HCL 100 MG PO TABS: 100.0000 mg | ORAL_TABLET | Freq: Every evening | ORAL | Status: DC | PRN

## 2017-02-10 MED ORDER — DIVALPROEX SODIUM 250 MG PO DR TAB: 250.0000 mg | DELAYED_RELEASE_TABLET | Freq: Three times a day (TID) | ORAL | 0 refills | Status: DC

## 2017-02-10 MED ORDER — QUETIAPINE FUMARATE 25 MG PO TABS: 25.0000 mg | ORAL_TABLET | Freq: Three times a day (TID) | ORAL | Status: DC | PRN

## 2017-02-10 MED ORDER — QUETIAPINE FUMARATE 25 MG PO TABS: 25.0000 mg | ORAL_TABLET | Freq: Three times a day (TID) | ORAL | 0 refills | Status: DC | PRN

## 2017-02-10 MED ORDER — DIVALPROEX SODIUM 250 MG PO DR TAB
250.0000 mg | DELAYED_RELEASE_TABLET | Freq: Three times a day (TID) | ORAL | Status: DC
  Administered 2017-02-10: 250 mg via ORAL
  Filled 2017-02-10: qty 1

## 2017-02-10 MED ORDER — TRAZODONE HCL 100 MG PO TABS: 100.0000 mg | ORAL_TABLET | Freq: Every evening | ORAL | 0 refills | Status: DC | PRN

## 2017-02-10 MED ORDER — GABAPENTIN 300 MG PO CAPS
300.0000 mg | ORAL_CAPSULE | Freq: Four times a day (QID) | ORAL | Status: DC
  Administered 2017-02-10: 300 mg via ORAL
  Filled 2017-02-10: qty 1

## 2017-02-10 MED ORDER — QUETIAPINE FUMARATE 400 MG PO TABS: 400.0000 mg | ORAL_TABLET | Freq: Every day | ORAL | 0 refills | Status: DC

## 2017-02-10 MED ORDER — GABAPENTIN 300 MG PO CAPS: 300.0000 mg | ORAL_CAPSULE | Freq: Four times a day (QID) | ORAL | 0 refills | Status: DC

## 2017-02-10 MED ORDER — CITALOPRAM HYDROBROMIDE 10 MG PO TABS
20.0000 mg | ORAL_TABLET | Freq: Every day | ORAL | Status: DC
  Administered 2017-02-10: 20 mg via ORAL
  Filled 2017-02-10: qty 2

## 2017-02-10 NOTE — ED Provider Notes (Signed)
[facility] DEPT Provider Note   CSN: [id number] Arrival date & time: 02/10/17  0908     History   Chief Complaint Chief Complaint  Patient presents with  . Suicidal    HPI Deborah Melendez is a 41 y.o. female.  Deborah Melendez is a 41 y.o. female with h/o MDD and substance abuse presents to clinic with complaint of suicidal ideation. Onset last night that has progressively worsened. Plan of suicide to include either "taking pills" or "cutting self." States "I don't feel safe at home." Denies taking any pills PTA. Recently seen in ED for SI, transferred to [facility], reports leaving AMA. Endorses depression and anxiety. H/o bipolar disorder. Denies HI, V/A hallucinations, alcohol use, tobacco use, or recreational drug use. H/o self injury when younger. Denies chronic medical conditions. Complains of intermittent chronic right knee pain s/p injury, denies fever, numbness, weakness, swelling. Reports she had injury approximately one year ago and complains of intermittent pain. No new injury, requests new knee sleeve.      Past Medical History:  Diagnosis Date  . MRSA (methicillin resistant Staphylococcus aureus)    surgery on finger 3 years ago  . Substance or medication-induced bipolar and related disorder with onset during intoxication ([place]) 09/08/2016    Patient Active Problem List   Diagnosis Date Noted  . Adjustment disorder with depressed mood 02/10/2017  . Bipolar 1 disorder, depressed, severe ([place]) 02/04/2017  . Panic attacks 01/21/2017  . 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 Cannabis use disorder, severe, dependence ([place]) 01/20/2017  . MDD (major depressive disorder), recurrent severe, without psychosis ([place]) 08/08/2015  . Cocaine use disorder, severe, dependence ([place]) 08/08/2015  . Cigar smoker motivated to quit 07/06/2014    Past Surgical History:  Procedure Laterality Date  . FINGER SURGERY       OB History    Gravida Para Term Preterm AB Living   7 5       5    SAB TAB Ectopic Multiple Live Births           1       Home Medications    Prior to Admission medications   Medication Sig Start Date End Date Taking? Authorizing Provider  citalopram (CELEXA) 20 MG tablet Take 1 tablet (20 mg total) by mouth daily. For depression 02/10/17   Patrecia Pour, NP  divalproex (DEPAKOTE) 250 MG DR tablet Take 1 tablet (250 mg total) by mouth every 8 (eight) hours. For mood stabilization 02/10/17   Patrecia Pour, NP  gabapentin (NEURONTIN) 300 MG capsule Take 1 capsule (300 mg total) by mouth 4 (four) times daily. For agitation 02/10/17   Patrecia Pour, NP  hydrOXYzine (ATARAX/VISTARIL) 50 MG tablet Take 1 tablet (50 mg total) by mouth every 6 (six) hours as needed for anxiety. 02/10/17   Patrecia Pour, NP  QUEtiapine (SEROQUEL) 25 MG tablet Take 1 tablet (25 mg total) by mouth 3 (three) times daily as needed (severe anxiety/agitation). 02/10/17   Patrecia Pour, NP  QUEtiapine (SEROQUEL) 400 MG tablet Take 1 tablet (400 mg total) by mouth at bedtime. For mood control 02/10/17   Patrecia Pour, NP  traZODone (DESYREL) 100 MG tablet Take 1 tablet (100 mg total) by mouth at bedtime as needed for sleep. 02/10/17   Patrecia Pour, NP    Family History Family History  Problem Relation Age of Onset  . Diabetes Mother   . Hypertension Mother   .  Drug abuse Father   . Schizophrenia Maternal Aunt     Social History Social History  Substance Use Topics  . Smoking status: Current Every Day Smoker    Packs/day: 0.50    Types: Cigarettes  . Smokeless tobacco: Never Used     Comment: pt reported quiting three weeks ago  . Alcohol use Yes     Comment: occasional      Allergies   Patient has no known allergies.   Review of Systems Review of Systems  Constitutional: Negative for fever.  HENT: Negative for trouble swallowing.   Eyes: Negative for visual disturbance.  Respiratory:  Negative for shortness of breath.   Cardiovascular: Negative for chest pain.  Gastrointestinal: Negative for abdominal pain.  Genitourinary: Negative for dysuria and hematuria.  Musculoskeletal: Positive for arthralgias (right knee pain, chronic ). Negative for joint swelling.  Allergic/Immunologic: Negative for immunocompromised state.  Neurological: Negative for syncope, weakness and numbness.  Psychiatric/Behavioral: Positive for suicidal ideas. The patient is nervous/anxious.      Physical Exam Updated Vital Signs BP 104/72 (BP Location: Left Arm)   Pulse 90   Temp 97.5 F (36.4 C) (Oral)   Resp 20   LMP 01/21/2017   SpO2 100%   Physical Exam  Constitutional: She appears well-developed and well-nourished. No distress.  HENT:  Head: Normocephalic and atraumatic.  Eyes: Conjunctivae are normal. No scleral icterus.  Neck: Normal range of motion.  Cardiovascular: Normal rate, regular rhythm and normal heart sounds.   No murmur heard. Pulmonary/Chest: Effort normal and breath sounds normal. No respiratory distress.  Abdominal: Soft. She exhibits no distension. There is no tenderness.  Musculoskeletal: Normal range of motion.  Right knee: No swelling, warmth, erythema, or tenderness. ROM intact. Sensation intact. DP pulses intact. No posterior calf tenderness or swelling.   Neurological: She is alert.  Skin: Skin is warm and dry. She is not diaphoretic.  Psychiatric: She exhibits a depressed mood. She expresses suicidal ideation. She expresses suicidal plans.     ED Treatments / Results  Labs (all labs ordered are listed, but only abnormal results are displayed) Labs Reviewed  COMPREHENSIVE METABOLIC PANEL - Abnormal; Notable for the following:       Result Value   Glucose, Bld 55 (*)    All other components within normal limits  ACETAMINOPHEN LEVEL - Abnormal; Notable for the following:    Acetaminophen (Tylenol), Serum <10 (*)    All other components within normal  limits  CBC - Abnormal; Notable for the following:    RBC 3.68 (*)    Hemoglobin 10.8 (*)    HCT 32.7 (*)    RDW 16.9 (*)    All other components within normal limits  ETHANOL  SALICYLATE LEVEL  RAPID URINE DRUG SCREEN, HOSP PERFORMED    EKG  EKG Interpretation None       Radiology No results found.  Procedures Procedures (including critical care time)  Medications Ordered in ED Medications  citalopram (CELEXA) tablet 20 mg (not administered)  divalproex (DEPAKOTE) DR tablet 250 mg (not administered)  gabapentin (NEURONTIN) capsule 300 mg (not administered)  hydrOXYzine (ATARAX/VISTARIL) tablet 50 mg (not administered)  QUEtiapine (SEROQUEL) tablet 25 mg (not administered)  QUEtiapine (SEROQUEL) tablet 400 mg (not administered)  traZODone (DESYREL) tablet 100 mg (not administered)     Initial Impression / Assessment and Plan / ED Course  I have reviewed the triage vital signs and the nursing notes.  Pertinent labs & imaging results that were available  during my care of the patient were reviewed by me and considered in my medical decision making (see chart for details).     Patient presents to ED with complaint of suicidal ideation. Has plan - pills or cutting self. No hallucinations. No recreational drug use. Patient is afebrile and non-toxic appearing in NAD. VSS.  Heart RRR. Lungs CTABL. Abdomen soft, non-tender. No swelling, redness, erythema, or tenderness of right knee; ROM, sensation, and DP pulses intact. Knee sleeve given. Acetaminophen, salicylate, ethanol nml. UDS nml. Mild hypoglycemia, pt eating crackers and peanut butter. Anemia present, hgb stable compared to previous, asx. Pt is medically cleared. Awaiting TTS consult.   Final Clinical Impressions(s) / ED Diagnoses   Final diagnoses:  Adjustment disorder with depressed mood    New Prescriptions Current Discharge Medication List       Roxanna Mew, [place] 02/10/17 [place],  MD 02/10/17 1721
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
## 2017-02-10 NOTE — Progress Notes (Signed)
Right Knee Sleeve applied by ortho tech.

## 2017-02-10 NOTE — ED Triage Notes (Signed)
Patient states her boyfriend left last night and she became sad and stressed. Patient stated she is thinking about cutting herself with a knife. Patient sated she wants her medications.

## 2017-02-10 NOTE — Discharge Instructions (Signed)
For your ongoing mental health needs, you are advised to follow up with [facility].  New and returning patients are seen at their walk-in clinic.  Walk-in hours are Monday - Friday from 8:00 am - 3:00 pm.  Walk-in patients are seen on a first come, first served basis.  Try to arrive as early as possible for he best chance of being seen the same day: ° °     [facility] °     [address] °     [place], [place] [address] °     [phone] °

## 2017-02-10 NOTE — BH Assessment (Signed)
[place] Assessment Progress Note  Per Waylan Boga, DNP, this pt does not require psychiatric hospitalization at this time.  Pt is to be discharged from [facility] with recommendation to follow up with [facility].  This has been included in pt's discharge instructions.  Pt's nurse has been notified.  Jalene Mullet, [place] Triage Specialist [phone]

## 2017-02-10 NOTE — ED Notes (Signed)
Bed: WA31 Expected date:  Expected time:  Means of arrival:  Comments: 

## 2017-03-23 ENCOUNTER — Emergency Department (HOSPITAL_COMMUNITY)
Admission: EM | Admit: 2017-03-23 | Discharge: 2017-03-24 | Disposition: A | Discharge status: Other Acute Inpatient Hospital | Payer: Self-pay | Attending: Emergency Medicine | Admitting: Emergency Medicine

## 2017-03-23 ENCOUNTER — Ambulatory Visit (HOSPITAL_COMMUNITY)
Admission: RE | Admit: 2017-03-23 | Discharge: 2017-03-23 | Disposition: A | Discharge status: Home / Self Care | Payer: Federal, State, Local not specified - Other | Attending: Psychiatry | Admitting: Psychiatry

## 2017-03-23 DIAGNOSIS — F314 Bipolar disorder, current episode depressed, severe, without psychotic features: Secondary | ICD-10-CM | POA: Insufficient documentation

## 2017-03-23 DIAGNOSIS — R45851 Suicidal ideations: Secondary | ICD-10-CM

## 2017-03-23 DIAGNOSIS — F17210 Nicotine dependence, cigarettes, uncomplicated: Secondary | ICD-10-CM | POA: Insufficient documentation

## 2017-03-23 DIAGNOSIS — F1220 Cannabis dependence, uncomplicated: Secondary | ICD-10-CM | POA: Diagnosis present

## 2017-03-23 DIAGNOSIS — Z79899 Other long term (current) drug therapy: Secondary | ICD-10-CM | POA: Insufficient documentation

## 2017-03-23 LAB — COMPREHENSIVE METABOLIC PANEL
ALBUMIN: 4.2 g/dL (ref 3.5–5.0)
ALT: 14 U/L (ref 14–54)
AST: 26 U/L (ref 15–41)
Alkaline Phosphatase: 50 U/L (ref 38–126)
Anion gap: 7 (ref 5–15)
BILIRUBIN TOTAL: 0.6 mg/dL (ref 0.3–1.2)
BUN: 16 mg/dL (ref 6–20)
CHLORIDE: 108 mmol/L (ref 101–111)
CO2: 22 mmol/L (ref 22–32)
CREATININE: 0.95 mg/dL (ref 0.44–1.00)
Calcium: 9.1 mg/dL (ref 8.9–10.3)
GFR calc Af Amer: >60 mL/min (ref >60)
GLUCOSE: 123 mg/dL — AB (ref 65–99)
POTASSIUM: 3.5 mmol/L (ref 3.5–5.1)
Sodium: 137 mmol/L (ref 135–145)
Total Protein: 8.1 g/dL (ref 6.5–8.1)

## 2017-03-23 LAB — SALICYLATE LEVEL: Salicylate Lvl: <7 mg/dL (ref 2.8–30.0)

## 2017-03-23 LAB — RAPID URINE DRUG SCREEN, HOSP PERFORMED
AMPHETAMINES: NOT DETECTED
BARBITURATES: NOT DETECTED
BENZODIAZEPINES: NOT DETECTED
Cocaine: POSITIVE — AB
Opiates: NOT DETECTED
TETRAHYDROCANNABINOL: POSITIVE — AB

## 2017-03-23 LAB — CBC
HEMATOCRIT: 37.8 % (ref 36.0–46.0)
Hemoglobin: 12.5 g/dL (ref 12.0–15.0)
MCH: 29.3 pg (ref 26.0–34.0)
MCHC: 33.1 g/dL (ref 30.0–36.0)
MCV: 88.7 fL (ref 78.0–100.0)
Platelets: 266 10*3/uL (ref 150–400)
RBC: 4.26 MIL/uL (ref 3.87–5.11)
RDW: 17.3 % — ABNORMAL HIGH (ref 11.5–15.5)
WBC: 6.7 10*3/uL (ref 4.0–10.5)

## 2017-03-23 LAB — POC URINE PREG, ED
PREG TEST UR: NEGATIVE
Preg Test, Ur: NEGATIVE

## 2017-03-23 LAB — ACETAMINOPHEN LEVEL: Acetaminophen (Tylenol), Serum: <10 ug/mL — ABNORMAL LOW (ref 10–30)

## 2017-03-23 LAB — ETHANOL: Alcohol, Ethyl (B): <5 mg/dL (ref <5)

## 2017-03-23 MED ORDER — GABAPENTIN 300 MG PO CAPS: 300.0000 mg | ORAL_CAPSULE | Freq: Four times a day (QID) | ORAL | Status: DC

## 2017-03-23 MED ORDER — CITALOPRAM HYDROBROMIDE 10 MG PO TABS
20.0000 mg | ORAL_TABLET | Freq: Every day | ORAL | Status: DC
  Administered 2017-03-23: 20 mg via ORAL
  Filled 2017-03-23: qty 2

## 2017-03-23 MED ORDER — HYDROXYZINE HCL 25 MG PO TABS
50.0000 mg | ORAL_TABLET | Freq: Four times a day (QID) | ORAL | Status: DC | PRN
  Administered 2017-03-23 (×2): 50 mg via ORAL
  Filled 2017-03-23 (×2): qty 2

## 2017-03-23 MED ORDER — GABAPENTIN 300 MG PO CAPS
300.0000 mg | ORAL_CAPSULE | Freq: Two times a day (BID) | ORAL | Status: DC
  Administered 2017-03-23 (×2): 300 mg via ORAL
  Filled 2017-03-23 (×2): qty 1

## 2017-03-23 NOTE — ED Notes (Signed)
Patient sleeping at this time. Respiration even and unlabored. No distress noted. Will continue to monitor patient.

## 2017-03-23 NOTE — BH Assessment (Signed)
  Disposition: Per Patriciaann Clan, PA - patient meets criteria for inpatient hospitalization.  Writer informed the Charge Nurse Jana Half) that the patient will be coming to the ED.
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
## 2017-03-23 NOTE — BH Assessment (Signed)
Tele Assessment Note   Avalin Briley is an 41 y.o. female that reports SI.  Patient reports that she does not have a plan but she will find a way to kill herself.  Patient reports that she cannot contract for safety.   Patient has a prior history of psychiatric inpatient hospitalizations.  Patient reports increased depression associated with being off her psychiatric medication for a couple of weeks.  Patient could not remember the name of her psychiatrist or the exact date that she stopped taking her medication.    Patient denies HI/Psychosis.  Patient reports that her long term boyfriend dropped her off and left because he had to go to work.   Diagnosis: Bipolar Disorder   Past Medical History:  Past Medical History:  Diagnosis Date  . MRSA (methicillin resistant Staphylococcus aureus)    surgery on finger 3 years ago  . Substance or medication-induced bipolar and related disorder with onset during intoxication ([place]) 09/08/2016    Past Surgical History:  Procedure Laterality Date  . FINGER SURGERY      Family History:  Family History  Problem Relation Age of Onset  . Diabetes Mother   . Hypertension Mother   . Drug abuse Father   . Schizophrenia Maternal Aunt     Social History:  reports that she has been smoking Cigarettes.  She has been smoking about 0.50 packs per day. She has never used smokeless tobacco. She reports that she drinks alcohol. She reports that she does not use drugs.  Additional Social History:  Alcohol / Drug Use History of alcohol / drug use?: Yes Longest period of sobriety (when/how long): Unable to remember Negative Consequences of Use: Financial, Personal relationships, Work / School Withdrawal Symptoms:  (None Reported) Substance #1 Name of Substance 1: Cocaine 1 - Age of First Use: 18 1 - Amount (size/oz): Varies 1 - Frequency: Occassionally 1 - Duration: Patient reports abusing cocaine for years  1 - Last Use / Amount: 2 years ago   CIWA:    COWS:    PATIENT STRENGTHS: (choose at least two) Capable of independent living Communication skills  Allergies: No Known Allergies  Home Medications:  (Not in a hospital admission)  OB/GYN Status:  No LMP recorded.  General Assessment Data Location of Assessment: [facility] (Walk [facility] at [facility]) TTS Assessment: In system Is this a Tele or Face-to-Face Assessment?: Face-to-Face Is this an Initial Assessment or a Re-assessment for this encounter?: Initial Assessment Marital status: Single Maiden name: NA Is patient pregnant?: No Pregnancy Status: No Living Arrangements: Spouse/significant other Can pt return to current living arrangement?: Yes Admission Status: Voluntary Is patient capable of signing voluntary admission?: Yes Referral Source: Self/Family/Friend Insurance type: Multimedia programmer Exam ([place]) Medical Exam completed: Yes (MSE Exam completed by Patriciaann Clan )  Crisis Care Plan Living Arrangements: Spouse/significant other Legal Guardian:  (NA) Name of Psychiatrist: Unable to remember Name of Therapist: None Reported  Education Status Is patient currently in school?: No Current Grade: NA Highest grade of school patient has completed: NA Name of school: NA Contact person: NA  Risk to self with the past 6 months Suicidal Ideation: Yes-Currently Present Has patient been a risk to self within the past 6 months prior to admission? : Yes Suicidal Intent: Yes-Currently Present Has patient had any suicidal intent within the past 6 months prior to admission? : Yes Is patient at risk for suicide?: Yes Suicidal Plan?: Yes-Currently Present Has patient had any suicidal  plan within the past 6 months prior to admission? : Yes Specify Current Suicidal Plan: Pt refused to state Access to Means: Yes Specify Access to Suicidal Means: Pt refused to state What has been your use of drugs/alcohol within the last 12 months?: Cocaine   Previous Attempts/Gestures: No How many times?: 0 Other Self Harm Risks: NA Triggers for Past Attempts:  (NA) Intentional Self Injurious Behavior: None Family Suicide History: No Recent stressful life event(s): Other (Comment) (Pt ran out of her medication ) Persecutory voices/beliefs?: No Depression: Yes Depression Symptoms: Despondent, Insomnia, Tearfulness, Isolating, Fatigue, Guilt, Loss of interest in usual pleasures, Feeling worthless/self pity, Feeling angry/irritable Substance abuse history and/or treatment for substance abuse?: Yes Suicide prevention information given to non-admitted patients: Yes  Risk to Others within the past 6 months Homicidal Ideation: No Does patient have any lifetime risk of violence toward others beyond the six months prior to admission? : No Thoughts of Harm to Others: No Current Homicidal Intent: No Current Homicidal Plan: No Access to Homicidal Means: No Identified Victim: NA History of harm to others?: No Assessment of Violence: None Noted Violent Behavior Description: NA Does patient have access to weapons?: No Criminal Charges Pending?: No Does patient have a court date: No Is patient on probation?: No  Psychosis Hallucinations: None noted Delusions: None noted  Mental Status Report Appearance/Hygiene: Unremarkable Eye Contact: Fair Motor Activity: Freedom of movement, Hyperactivity, Restlessness Speech: Logical/coherent Level of Consciousness: Alert, Restless, Crying, Irritable Mood: Depressed, Anxious, Suspicious Affect: Appropriate to circumstance Anxiety Level: Minimal Thought Processes: Coherent, Relevant Judgement: Impaired Orientation: Person, Place, Time, Situation Obsessive Compulsive Thoughts/Behaviors: None  Cognitive Functioning Concentration: Decreased Memory: Recent Intact, Remote Intact IQ: Average Insight: Fair Impulse Control: Poor Appetite: Fair Weight Loss: 0 Weight Gain: 0 Sleep: Decreased Total  Hours of Sleep: 3 Vegetative Symptoms: Decreased grooming, Not bathing, Staying in bed  ADLScreening [facility] Assessment [facility]) Patient's cognitive ability adequate to safely complete daily activities?: Yes Patient able to express need for assistance with ADLs?: Yes Independently performs ADLs?: Yes (appropriate for developmental age)  Prior Inpatient Therapy Prior Inpatient Therapy: Yes Prior Therapy Dates: 2018 Prior Therapy Facilty/Provider(s): [facility] Reason for Treatment: SI and SA  Prior Outpatient Therapy Prior Outpatient Therapy: Yes Prior Therapy Dates: Ongoing  Prior Therapy Facilty/Provider(s): Unable to remember the name  Reason for Treatment: Unable to rememnber the name Does patient have an ACCT team?: No Does patient have Intensive In-House Services?  : No Does patient have [facility] services? : No Does patient have P4CC services?: No  ADL Screening (condition at time of admission) Patient's cognitive ability adequate to safely complete daily activities?: Yes Is the patient deaf or have difficulty hearing?: No Does the patient have difficulty seeing, even when wearing glasses/contacts?: No Does the patient have difficulty concentrating, remembering, or making decisions?: Yes Patient able to express need for assistance with ADLs?: Yes Does the patient have difficulty dressing or bathing?: No Independently performs ADLs?: Yes (appropriate for developmental age) Does the patient have difficulty walking or climbing stairs?: No Weakness of Legs: None Weakness of Arms/Hands: None  Home Assistive Devices/Equipment Home Assistive Devices/Equipment: None    Abuse/Neglect Assessment (Assessment to be complete while patient is alone) Physical Abuse: Denies Verbal Abuse: Denies Sexual Abuse: Denies Exploitation of patient/patient's resources: Denies Self-Neglect: Denies Values / Beliefs Cultural Requests During Hospitalization: None Spiritual Requests During  Hospitalization: None Consults Spiritual Care Consult Needed: No Social Work Consult Needed: No Regulatory affairs officer (For Healthcare) Does Patient Have a Medical Advance  Directive?: No Would patient like information on creating a medical advance directive?: No - Patient declined    Additional Information 1:1 In Past 12 Months?: No CIRT Risk: No Elopement Risk: No Does patient have medical clearance?: Yes     Disposition: Per Patriciaann Clan, PA - patient meets criteria for inpatient hospitalization.   Disposition Initial Assessment Completed for this Encounter: Yes Disposition of Patient: Inpatient treatment program (Per Patriciaann Clan, PA - meets inpt criteria. )  Graciella Freer [place] 03/23/2017 7:55 AM

## 2017-03-23 NOTE — ED Notes (Signed)
Sheriff called and said that they cannot transport today and asked that staff call again in the morning.  This Probation officer spoke with Vergie Living at [facility] and informed him that pt will not be transported until tomorrow morning.

## 2017-03-23 NOTE — BH Assessment (Signed)
[facility] Assessment Progress Note  Per Patriciaann Clan, PA, this pt requires psychiatric hospitalization at this time.  EDP Theotis Burrow, MD has initiated IVC, and IVC documents have been faxed to [facility].  At [employer] confirms receipt.  At 16:30 [place] calls from [facility] to report that pt has been accepted to their facility by Dr Renard Hamper.  Waylan Boga, DNP, concurs with this decision.  Pt's nurse, Diane, has been notified, and agrees to call report to [phone].  Pt is to be transported via [facility]  Jalene Mullet, [place] Triage Specialist [phone]

## 2017-03-23 NOTE — ED Notes (Signed)
Introduced self to patient. Pt oriented to unit expectations.  Assessed pt for:  A) Anxiety &/or agitation: On admission to the [facility] pt was crying and anxious. She was infantile in that she made her needs known by crying and whining and using non-verbal hand cues. She took her medication.   S) Safety: Safety maintained with q-15-minute checks and hourly rounds by staff.  A) ADLs: Pt able to perform ADLs independently.  P) Pick-Up (room cleanliness): Pt's room clean and free of clutter.

## 2017-03-23 NOTE — ED Provider Notes (Signed)
[facility] DEPT Provider Note   CSN: [id number] Arrival date & time: 03/23/17 0820     History    Chief Complaint  Patient presents with  . Suicidal     HPI Deborah Melendez is a 41 y.o. female.  41yo F w/ PMH including bipolar d/o, substance abuse who p/w depression and SI. Patient states that her depression has been doing very poorly recently. She reports feeling worthless and that she is "not coping today." She ran out of her medication 3 weeks ago. She reports suicidal ideation with no specific plan. She denies any HI or hallucinations. No fevers, vomiting, or recent illness. She does use cocaine, last use was 3 days ago.   Past Medical History:  Diagnosis Date  . MRSA (methicillin resistant Staphylococcus aureus)    surgery on finger 3 years ago  . Substance or medication-induced bipolar and related disorder with onset during intoxication ([place]) 09/08/2016     Patient Active Problem List   Diagnosis Date Noted  . Adjustment disorder with depressed mood 02/10/2017  . Bipolar 1 disorder, depressed, severe ([place]) 02/04/2017  . Panic attacks 01/21/2017  . 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 Cannabis use disorder, severe, dependence ([place]) 01/20/2017  . MDD (major depressive disorder), recurrent severe, without psychosis ( [place]) 08/08/2015  . Cocaine use disorder, severe, dependence ([place]) 08/08/2015  . Cigar smoker motivated to quit 07/06/2014    Past Surgical History:  Procedure Laterality Date  . FINGER SURGERY      OB History    Gravida Para Term Preterm AB Living   7 5       5    SAB TAB Ectopic Multiple Live Births           1        Home Medications    Prior to Admission medications   Medication Sig Start Date End Date Taking? Authorizing Provider  citalopram (CELEXA) 20 MG tablet Take 1 tablet (20 mg total) by mouth daily. For depression 02/10/17  Yes Patrecia Pour, NP  divalproex  (DEPAKOTE) 250 MG DR tablet Take 1 tablet (250 mg total) by mouth every 8 (eight) hours. For mood stabilization 02/10/17  Yes Patrecia Pour, NP  gabapentin (NEURONTIN) 300 MG capsule Take 1 capsule (300 mg total) by mouth 4 (four) times daily. For agitation 02/10/17  Yes Patrecia Pour, NP  hydrOXYzine (ATARAX/VISTARIL) 50 MG tablet Take 1 tablet (50 mg total) by mouth every 6 (six) hours as needed for anxiety. 02/10/17  Yes Patrecia Pour, NP  QUEtiapine (SEROQUEL) 25 MG tablet Take 1 tablet (25 mg total) by mouth 3 (three) times daily as needed (severe anxiety/agitation). 02/10/17  Yes Patrecia Pour, NP  QUEtiapine (SEROQUEL) 400 MG tablet Take 1 tablet (400 mg total) by mouth at bedtime. For mood control 02/10/17  Yes Patrecia Pour, NP  traZODone (DESYREL) 100 MG tablet Take 1 tablet (100 mg total) by mouth at bedtime as needed for sleep. 02/10/17  Yes Patrecia Pour, NP      Family History  Problem Relation Age of Onset  . Diabetes Mother   . Hypertension Mother   . Drug abuse Father   . Schizophrenia Maternal Aunt      Social History  Substance Use Topics  . Smoking status: Current Every Day Smoker    Packs/day: 0.50    Types: Cigarettes  . Smokeless tobacco: Never Used     Comment: pt reported quiting  three weeks ago  . Alcohol use Yes     Comment: occasional      Allergies     Patient has no known allergies.    Review of Systems  10 Systems reviewed and are negative for acute change except as noted in the HPI.   Physical Exam Updated Vital Signs BP (!) 137/91 (BP Location: Left Arm)   Pulse 91   Temp 98.4 F (36.9 C) (Oral)   Resp 20   SpO2 100%   Physical Exam  Constitutional: She is oriented to person, place, and time. She appears well-developed and well-nourished. No distress.  HENT:  Head: Normocephalic and atraumatic.  Eyes: Conjunctivae are normal.  Neck: Neck supple.  Cardiovascular: Normal rate, regular rhythm and normal heart sounds.   No murmur  heard. Pulmonary/Chest: Effort normal and breath sounds normal.  Neurological: She is alert and oriented to person, place, and time.  Fluent speech  Skin: Skin is warm and dry.  Psychiatric:  Tearful, depressed mood, slightly agitated  Nursing note and vitals reviewed.     ED Treatments / Results  Labs (all labs ordered are listed, but only abnormal results are displayed) Labs Reviewed  COMPREHENSIVE METABOLIC PANEL - Abnormal; Notable for the following:       Result Value   Glucose, Bld 123 (*)    All other components within normal limits  ACETAMINOPHEN LEVEL - Abnormal; Notable for the following:    Acetaminophen (Tylenol), Serum <10 (*)    All other components within normal limits  CBC - Abnormal; Notable for the following:    RDW 17.3 (*)    All other components within normal limits  RAPID URINE DRUG SCREEN, HOSP PERFORMED - Abnormal; Notable for the following:    Cocaine POSITIVE (*)    Tetrahydrocannabinol POSITIVE (*)    All other components within normal limits  ETHANOL  SALICYLATE LEVEL  POC URINE PREG, ED  POC URINE PREG, ED     EKG  EKG Interpretation  Date/Time:    Ventricular Rate:    PR Interval:    QRS Duration:   QT Interval:    QTC Calculation:   R Axis:     Text Interpretation:           Radiology No results found.  Procedures Procedures (including critical care time) Procedures  Medications Ordered in ED  Medications  citalopram (CELEXA) tablet 20 mg (20 mg Oral Given 03/23/17 1142)  hydrOXYzine (ATARAX/VISTARIL) tablet 50 mg (50 mg Oral Given 03/23/17 1142)  gabapentin (NEURONTIN) capsule 300 mg (300 mg Oral Given 03/23/17 1142)     Initial Impression / Assessment and Plan / ED Course  I have reviewed the triage vital signs and the nursing notes.  Pertinent labs & imaging results that were available during my care of the patient were reviewed by me and considered in my medical decision making (see chart for details).    Pt  w/ h/o bipolar d/o off medications for the past 3 weeks presents with worsening depression and suicidal ideation. She was tearful and upset during exam stating that she was doing very poorly today. Vital signs stable. She has a reassuring physical exam. Basic screening lab work is unremarkable aside from UDS positive for cocaine and THC. She is medically clear for psychiatric evaluation. Her disposition will be determined by psychiatry team recommendations.  Final Clinical Impressions(s) / ED Diagnoses   Final diagnoses:  None     New Prescriptions   No medications on  file       Sharlett Iles, MD 03/23/17 [phone]

## 2017-03-23 NOTE — ED Triage Notes (Signed)
Pt arrives w/ [facility] escort w/ c/o SI w/o plan. No obvious self-injury noted. Pt A+OX4, speaking in complete sentences.

## 2017-03-23 NOTE — ED Notes (Signed)
Meal ordered

## 2017-03-23 NOTE — ED Notes (Signed)
Bed: WA32 Expected date:  Expected time:  Means of arrival:  Comments: 

## 2017-03-23 NOTE — ED Notes (Signed)
Pt woke up from a nap today and was expressing her needs verbally, unlike at the time of admission when she was childlike. She confirms that she continues to feel like killing herself. Her behavior has been appropriate.

## 2017-03-23 NOTE — Progress Notes (Signed)
03/23/17 1350:  LRT went to pt room to offer activities, pt was sleep.  Victorino Sparrow, LRT/CTRS

## 2017-03-23 NOTE — H&P (Addendum)
Behavioral Health Medical Screening Exam  Deborah Melendez is an 41 y.o. female, known to [facility] presenting via [place] recently dropped off by her husband. Deborah Melendez is endorsing depression I.e.anhedonia, despair and hopelessness x 3 weeks duration. She is also unable to contract for safety but denies SI with plan, intent or means. She has been off her medications x 3 weeks duration. She is endorsing a hx of Bipolar 2 disorder and substance induced mood disdosder.. She last used crack cocaine 2 days ago. She is denying any acute ailments at this present time.Deborah Melendez is denying any AVH or HI at this present time.  Total Time spent with patient: 20 minutes  Psychiatric Specialty Exam: Physical Exam  Constitutional: She is oriented to person, place, and time. She appears well-developed and well-nourished. No distress.  HENT:  Head: Normocephalic.  Eyes: Pupils are equal, round, and reactive to light.  Respiratory: Effort normal and breath sounds normal.  Neurological: She is alert and oriented to person, place, and time. No cranial nerve deficit.  Skin: Skin is warm and dry. She is not diaphoretic.  Psychiatric: Her speech is normal. Her mood appears anxious. She is agitated. Cognition and memory are impaired. She expresses impulsivity. She exhibits a depressed mood. She expresses suicidal ideation.    Review of Systems  Psychiatric/Behavioral: Positive for depression, substance abuse and suicidal ideas. The patient is nervous/anxious and has insomnia.   All other systems reviewed and are negative.   There were no vitals taken for this visit.There is no height or weight on file to calculate BMI.  General Appearance: Disheveled  Eye Contact:  Poor  Speech:  Garbled  Volume:  Normal  Mood:  Depressed  Affect:  Congruent  Thought Process:  Goal Directed  Orientation:  Full (Time, Place, and Person)  Thought Content:  Negative  Suicidal Thoughts:  Yes.  without intent/plan  Homicidal  Thoughts:  No  Memory:  Immediate;   Fair  Judgement:  Poor  Insight:  Lacking  Psychomotor Activity:  Negative  Concentration: Concentration: Poor  Recall:  Poor  Fund of Knowledge:Poor  Language: Negative  Akathisia:  Negative  Handed:  Right  AIMS (if indicated):     Assets:  Desire for Improvement  Sleep:       Musculoskeletal: Strength & Muscle Tone: within normal limits Gait & Station: normal Patient leans: N/A  There were no vitals taken for this visit.  Recommendations:  Based on my evaluation the patient appears to have an emergency medical condition for which I recommend the patient be transferred to the emergency department for further evaluation.  Made addendum to note due to RN signing off original note.  Makhari Dovidio,Humble E, PA-C 03/23/2017, 7:32 AM

## 2017-03-24 NOTE — ED Notes (Signed)
Family at bedside. 

## 2017-03-24 NOTE — ED Notes (Signed)
Patient is refusing to respond to me to get her vitals. Patient is awake but just do not want to be bothered. Rn Donnalee Curry was made aware

## 2017-03-24 NOTE — ED Notes (Signed)
Patient woke up irritable cursing and threatening staff. Requesting food, loud and disruptive. Was acting out at the same time with a peer. When patient saw that peer is getting a shot, she went in her room and lay down. No further issue at this time. Will continue to monitor patient.

## 2017-03-24 NOTE — ED Notes (Signed)
Patient transferred to [facility] for further treatment.

## 2017-05-21 ENCOUNTER — Encounter (HOSPITAL_COMMUNITY): Payer: Self-pay | Admitting: Emergency Medicine

## 2017-05-21 ENCOUNTER — Emergency Department (HOSPITAL_COMMUNITY)
Admission: EM | Admit: 2017-05-21 | Discharge: 2017-05-22 | Disposition: A | Discharge status: Other Acute Inpatient Hospital | Payer: Federal, State, Local not specified - Other | Attending: Emergency Medicine | Admitting: Emergency Medicine

## 2017-05-21 DIAGNOSIS — Z79899 Other long term (current) drug therapy: Secondary | ICD-10-CM | POA: Insufficient documentation

## 2017-05-21 DIAGNOSIS — F1420 Cocaine dependence, uncomplicated: Secondary | ICD-10-CM | POA: Diagnosis present

## 2017-05-21 DIAGNOSIS — F17210 Nicotine dependence, cigarettes, uncomplicated: Secondary | ICD-10-CM | POA: Insufficient documentation

## 2017-05-21 DIAGNOSIS — R45850 Homicidal ideations: Secondary | ICD-10-CM | POA: Insufficient documentation

## 2017-05-21 DIAGNOSIS — R45851 Suicidal ideations: Secondary | ICD-10-CM

## 2017-05-21 DIAGNOSIS — R4589 Other symptoms and signs involving emotional state: Secondary | ICD-10-CM | POA: Insufficient documentation

## 2017-05-21 DIAGNOSIS — F1220 Cannabis dependence, uncomplicated: Secondary | ICD-10-CM | POA: Diagnosis present

## 2017-05-21 LAB — COMPREHENSIVE METABOLIC PANEL
ALBUMIN: 4.2 g/dL (ref 3.5–5.0)
ALT: 12 U/L — ABNORMAL LOW (ref 14–54)
ANION GAP: 4 — AB (ref 5–15)
AST: 22 U/L (ref 15–41)
Alkaline Phosphatase: 49 U/L (ref 38–126)
BUN: 15 mg/dL (ref 6–20)
CHLORIDE: 112 mmol/L — AB (ref 101–111)
CO2: 24 mmol/L (ref 22–32)
Calcium: 8.7 mg/dL — ABNORMAL LOW (ref 8.9–10.3)
Creatinine, Ser: 0.91 mg/dL (ref 0.44–1.00)
GFR calc Af Amer: >60 mL/min (ref >60)
GFR calc non Af Amer: >60 mL/min (ref >60)
GLUCOSE: 93 mg/dL (ref 65–99)
POTASSIUM: 3.5 mmol/L (ref 3.5–5.1)
SODIUM: 140 mmol/L (ref 135–145)
Total Bilirubin: 0.6 mg/dL (ref 0.3–1.2)
Total Protein: 7.5 g/dL (ref 6.5–8.1)

## 2017-05-21 LAB — RAPID URINE DRUG SCREEN, HOSP PERFORMED
AMPHETAMINES: NOT DETECTED
BARBITURATES: NOT DETECTED
BENZODIAZEPINES: NOT DETECTED
COCAINE: POSITIVE — AB
Opiates: NOT DETECTED
TETRAHYDROCANNABINOL: POSITIVE — AB

## 2017-05-21 LAB — ETHANOL: Alcohol, Ethyl (B): <5 mg/dL (ref <5)

## 2017-05-21 LAB — CBC
HEMATOCRIT: 33.4 % — AB (ref 36.0–46.0)
HEMOGLOBIN: 10.8 g/dL — AB (ref 12.0–15.0)
MCH: 28.3 pg (ref 26.0–34.0)
MCHC: 32.3 g/dL (ref 30.0–36.0)
MCV: 87.4 fL (ref 78.0–100.0)
Platelets: 243 10*3/uL (ref 150–400)
RBC: 3.82 MIL/uL — AB (ref 3.87–5.11)
RDW: 17.8 % — ABNORMAL HIGH (ref 11.5–15.5)
WBC: 5.5 10*3/uL (ref 4.0–10.5)

## 2017-05-21 LAB — I-STAT BETA HCG BLOOD, ED (MC, WL, AP ONLY): I-stat hCG, quantitative: <5 m[IU]/mL (ref <5)

## 2017-05-21 LAB — SALICYLATE LEVEL: Salicylate Lvl: <7 mg/dL (ref 2.8–30.0)

## 2017-05-21 LAB — VALPROIC ACID LEVEL: Valproic Acid Lvl: <10 ug/mL — ABNORMAL LOW (ref 50.0–100.0)

## 2017-05-21 LAB — ACETAMINOPHEN LEVEL

## 2017-05-21 MED ORDER — ALUM & MAG HYDROXIDE-SIMETH 200-200-20 MG/5ML PO SUSP: 30.0000 mL | Freq: Four times a day (QID) | ORAL | Status: DC | PRN

## 2017-05-21 MED ORDER — ONDANSETRON HCL 4 MG PO TABS: 4.0000 mg | ORAL_TABLET | Freq: Three times a day (TID) | ORAL | Status: DC | PRN

## 2017-05-21 MED ORDER — QUETIAPINE FUMARATE 300 MG PO TABS
400.0000 mg | ORAL_TABLET | Freq: Every day | ORAL | Status: DC
  Administered 2017-05-21: 21:00:00 400 mg via ORAL
  Filled 2017-05-21: qty 1

## 2017-05-21 MED ORDER — ACETAMINOPHEN 325 MG PO TABS: 650.0000 mg | ORAL_TABLET | ORAL | Status: DC | PRN

## 2017-05-21 MED ORDER — TRAZODONE HCL 100 MG PO TABS
100.0000 mg | ORAL_TABLET | Freq: Every evening | ORAL | Status: DC | PRN
  Administered 2017-05-21: 100 mg via ORAL
  Filled 2017-05-21: qty 1

## 2017-05-21 MED ORDER — HYDROXYZINE HCL 25 MG PO TABS: 50.0000 mg | ORAL_TABLET | Freq: Four times a day (QID) | ORAL | Status: DC | PRN

## 2017-05-21 MED ORDER — GABAPENTIN 300 MG PO CAPS
300.0000 mg | ORAL_CAPSULE | Freq: Four times a day (QID) | ORAL | Status: DC
  Administered 2017-05-21 – 2017-05-22 (×7): 300 mg via ORAL
  Filled 2017-05-21 (×7): qty 1

## 2017-05-21 MED ORDER — IBUPROFEN 200 MG PO TABS: 600.0000 mg | ORAL_TABLET | Freq: Three times a day (TID) | ORAL | Status: DC | PRN

## 2017-05-21 MED ORDER — QUETIAPINE FUMARATE 25 MG PO TABS: 25.0000 mg | ORAL_TABLET | Freq: Three times a day (TID) | ORAL | Status: DC | PRN

## 2017-05-21 MED ORDER — NICOTINE 21 MG/24HR TD PT24
21.0000 mg | MEDICATED_PATCH | Freq: Every day | TRANSDERMAL | Status: DC
Start: 2017-05-21 — End: 2017-05-22

## 2017-05-21 MED ORDER — DIVALPROEX SODIUM 250 MG PO DR TAB
250.0000 mg | DELAYED_RELEASE_TABLET | Freq: Three times a day (TID) | ORAL | Status: DC
  Administered 2017-05-21 – 2017-05-22 (×5): 250 mg via ORAL
  Filled 2017-05-21 (×5): qty 1

## 2017-05-21 NOTE — ED Triage Notes (Signed)
Per EMS patient from home for SI or HI of wanting to harm her boyfriend.  EMS reports patient suffers from anxiety and depression and was having panic attack when they arrived on scene.  Vitals: 146/98, 88HR, 98%.    Patient tearful, restless and agitated while asking patient standard triage questions.  Patient reports that wither her or her boyfriend is going to die today.  States, "i'm going to kill him if he dont give me any answers". Patient doesn't specifically have plan at this time of how she wants to hurt herself or her boyfriend at this time just states "one of [place] is going to die today wither him or me!"  Patient yells, "dont ask me anymore questions and get this shit off me" as patient rips off BP cuff and pulse ox and slings it across triage room.
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
## 2017-05-21 NOTE — Progress Notes (Signed)
05/21/17 1334:  LRT introduced self to pt and offered activities, pt declined.  Victorino Sparrow, LRT/CTRS

## 2017-05-21 NOTE — BH Assessment (Signed)
[facility] Assessment Progress Note   Case was staffed with Lord DNP who recommended patient be re-evaluated in the a.m.    

## 2017-05-21 NOTE — ED Notes (Signed)
Pt admitted to room #43.  Pt endorsing SI/HI. "I want to kill me and my boyfriend." Pt verbally contracts for safety. Pt denies AVH. Irritable, demanding. Pt reports she has been off her medication for 2 weeks d/t she can not afford them. Specimen cup at bedside, pt encouraged to provide urine per MD order. Encouragement and support provided. Special checks q 15 mins in place for safety, Video monitoring in place. Will continue to monitor.

## 2017-05-21 NOTE — ED Notes (Signed)
SBAR Report received from previous nurse. Pt received asleep on unit. Pt wouldn't wake to give meaningful answers to assessment questions related to  SI/ HI, A/V H, depression, anxiety, or pain at this time, but appears otherwise stable and free of distress. Pt reminded of camera surveillance, q 15 min rounds, and rules of the milieu. Will continue to assess.

## 2017-05-21 NOTE — ED Notes (Signed)
Bed: [facility] Expected date:  Expected time:  Means of arrival:  Comments: 
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
## 2017-05-21 NOTE — ED Notes (Signed)
Pt woke and used the phone asked for snack and night meds and went back to bed.

## 2017-05-22 ENCOUNTER — Encounter: Payer: Self-pay | Admitting: Psychiatry

## 2017-05-22 ENCOUNTER — Encounter (HOSPITAL_COMMUNITY): Payer: Self-pay | Admitting: Registered Nurse

## 2017-05-22 ENCOUNTER — Inpatient Hospital Stay
Admission: AD | Admit: 2017-05-22 | Discharge: 2017-05-31 | DRG: 885 | Disposition: A | Discharge status: Home / Self Care | Payer: No Typology Code available for payment source | Source: Intra-hospital | Attending: Psychiatry | Admitting: Psychiatry

## 2017-05-22 DIAGNOSIS — F39 Unspecified mood [affective] disorder: Secondary | ICD-10-CM

## 2017-05-22 DIAGNOSIS — Z79899 Other long term (current) drug therapy: Secondary | ICD-10-CM | POA: Diagnosis not present

## 2017-05-22 DIAGNOSIS — F259 Schizoaffective disorder, unspecified: Secondary | ICD-10-CM | POA: Diagnosis present

## 2017-05-22 DIAGNOSIS — R4587 Impulsiveness: Secondary | ICD-10-CM | POA: Diagnosis present

## 2017-05-22 DIAGNOSIS — R45851 Suicidal ideations: Secondary | ICD-10-CM

## 2017-05-22 DIAGNOSIS — R45850 Homicidal ideations: Secondary | ICD-10-CM | POA: Diagnosis present

## 2017-05-22 DIAGNOSIS — Z9114 Patient's other noncompliance with medication regimen: Secondary | ICD-10-CM

## 2017-05-22 DIAGNOSIS — Z813 Family history of other psychoactive substance abuse and dependence: Secondary | ICD-10-CM | POA: Diagnosis not present

## 2017-05-22 DIAGNOSIS — F17210 Nicotine dependence, cigarettes, uncomplicated: Secondary | ICD-10-CM | POA: Diagnosis present

## 2017-05-22 DIAGNOSIS — F410 Panic disorder [episodic paroxysmal anxiety] without agoraphobia: Secondary | ICD-10-CM | POA: Diagnosis present

## 2017-05-22 DIAGNOSIS — R451 Restlessness and agitation: Secondary | ICD-10-CM

## 2017-05-22 DIAGNOSIS — F419 Anxiety disorder, unspecified: Secondary | ICD-10-CM

## 2017-05-22 DIAGNOSIS — Z818 Family history of other mental and behavioral disorders: Secondary | ICD-10-CM

## 2017-05-22 DIAGNOSIS — F1220 Cannabis dependence, uncomplicated: Secondary | ICD-10-CM | POA: Diagnosis present

## 2017-05-22 DIAGNOSIS — G4700 Insomnia, unspecified: Secondary | ICD-10-CM

## 2017-05-22 DIAGNOSIS — F1420 Cocaine dependence, uncomplicated: Secondary | ICD-10-CM | POA: Diagnosis present

## 2017-05-22 DIAGNOSIS — F332 Major depressive disorder, recurrent severe without psychotic features: Secondary | ICD-10-CM

## 2017-05-22 DIAGNOSIS — F411 Generalized anxiety disorder: Secondary | ICD-10-CM | POA: Diagnosis present

## 2017-05-22 DIAGNOSIS — F17200 Nicotine dependence, unspecified, uncomplicated: Secondary | ICD-10-CM | POA: Diagnosis present

## 2017-05-22 MED ORDER — DIVALPROEX SODIUM 500 MG PO DR TAB
500.0000 mg | DELAYED_RELEASE_TABLET | Freq: Three times a day (TID) | ORAL | Status: DC
  Administered 2017-05-22 – 2017-05-27 (×13): 500 mg via ORAL
  Filled 2017-05-22 (×15): qty 1

## 2017-05-22 MED ORDER — TRAZODONE HCL 100 MG PO TABS
100.0000 mg | ORAL_TABLET | Freq: Every day | ORAL | Status: DC
  Administered 2017-05-22 – 2017-05-24 (×3): 100 mg via ORAL
  Filled 2017-05-22 (×4): qty 1

## 2017-05-22 MED ORDER — MAGNESIUM HYDROXIDE 400 MG/5ML PO SUSP: 30.0000 mL | Freq: Every day | ORAL | Status: DC | PRN

## 2017-05-22 MED ORDER — GABAPENTIN 300 MG PO CAPS
300.0000 mg | ORAL_CAPSULE | Freq: Three times a day (TID) | ORAL | Status: DC
  Administered 2017-05-23 – 2017-05-25 (×5): 300 mg via ORAL
  Filled 2017-05-22 (×8): qty 1

## 2017-05-22 MED ORDER — ALUM & MAG HYDROXIDE-SIMETH 200-200-20 MG/5ML PO SUSP: 30.0000 mL | ORAL | Status: DC | PRN

## 2017-05-22 MED ORDER — QUETIAPINE FUMARATE 50 MG PO TABS: 50.0000 mg | ORAL_TABLET | Freq: Three times a day (TID) | ORAL | Status: DC | PRN

## 2017-05-22 MED ORDER — HYDROXYZINE HCL 50 MG PO TABS
50.0000 mg | ORAL_TABLET | Freq: Three times a day (TID) | ORAL | Status: DC | PRN
  Administered 2017-05-23 – 2017-05-28 (×6): 50 mg via ORAL
  Filled 2017-05-22 (×8): qty 1

## 2017-05-22 MED ORDER — QUETIAPINE FUMARATE 200 MG PO TABS
400.0000 mg | ORAL_TABLET | Freq: Every day | ORAL | Status: DC
  Administered 2017-05-22 – 2017-05-24 (×3): 400 mg via ORAL
  Filled 2017-05-22 (×4): qty 2

## 2017-05-22 MED ORDER — ACETAMINOPHEN 325 MG PO TABS: 650.0000 mg | ORAL_TABLET | Freq: Four times a day (QID) | ORAL | Status: DC | PRN

## 2017-05-22 MED ORDER — QUETIAPINE FUMARATE 25 MG PO TABS
25.0000 mg | ORAL_TABLET | Freq: Three times a day (TID) | ORAL | Status: DC
  Administered 2017-05-23: 25 mg via ORAL
  Filled 2017-05-22: qty 1
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
## 2017-05-22 NOTE — ED Notes (Signed)
On the phone 

## 2017-05-22 NOTE — ED Notes (Signed)
Voluntary admission form faxed to [facility]

## 2017-05-22 NOTE — Progress Notes (Signed)
CSW faxed patient's referral to: [facility], [place], [facility] and [facility].   Abundio Miu, [facility]  Clinical Social Worker [phone]

## 2017-05-22 NOTE — ED Notes (Signed)
EDP into see 

## 2017-05-22 NOTE — ED Notes (Signed)
Pt is aware that she has been accepted to [facility] for admission.

## 2017-05-22 NOTE — ED Notes (Signed)
Pt ambulatory w/o difficulty to [facility] with pehlam, belongings given to driver.

## 2017-05-22 NOTE — BH Assessment (Signed)
Patient has been accepted to [facility]  Accepting physician is Dr. Bary Leriche  Attending Physician will be Dr. Bary Leriche.  Patient has been assigned to room 313, by [place].  Call report to [phone].  Representative/Transfer Coordinator is Virgina Organ Patient pre-admitted by [facility] Patient Access [facility] )   [facility] Staff Mendel Ryder, [facility]) made aware of acceptance.

## 2017-05-22 NOTE — ED Notes (Signed)
Patient refused vitals.

## 2017-05-22 NOTE — ED Provider Notes (Signed)
Contacted by nursing stating that the patient has been accepted at [facility] psychiatric facility under Dr. Bary Leriche. I personally assessed the patient who has no acute complaints. Felt she was appropriate for transfer.   Fatima Blank, MD 05/22/17 [phone]

## 2017-05-22 NOTE — ED Notes (Signed)
Pt sleeping soundly, would open eyes, but not verbally respond

## 2017-05-22 NOTE — ED Notes (Signed)
Up to the desk yelling, cursing at this writer about wanting crackers, using the phone and  to have the TV changed. Pt is aware that breakfast will be here shortly and that the phone are turned on at 9:00a.  Pt back to her room.

## 2017-05-23 DIAGNOSIS — F332 Major depressive disorder, recurrent severe without psychotic features: Principal | ICD-10-CM

## 2017-05-23 MED ORDER — QUETIAPINE FUMARATE 25 MG PO TABS
50.0000 mg | ORAL_TABLET | Freq: Three times a day (TID) | ORAL | Status: DC
  Administered 2017-05-23 – 2017-05-27 (×11): 50 mg via ORAL
  Filled 2017-05-23 (×13): qty 2

## 2017-05-23 NOTE — H&P (Signed)
Psychiatric Admission Assessment Adult  Patient Identification: Deborah Melendez MRN:  [id number] Date of Evaluation:  05/23/2017 Chief Complaint:  DEPRESSION Principal Diagnosis: Major depressive disorder, recurrent severe without psychotic features ([place]) Diagnosis:   Patient Active Problem List   Diagnosis Date Noted  . Tobacco use disorder [F17.200] 05/22/2017  . Major depressive disorder, recurrent severe without psychotic features ([place]) [F33.2] 05/22/2017  . Homicidal ideations [R45.850]   . Suicidal ideations [R45.851]   . Agitation [R45.1]   . Adjustment disorder with depressed mood [F43.21] 02/10/2017  . Bipolar 1 disorder, depressed, severe ([place]) [F31.4] 02/04/2017  . Panic attacks [F41.0] 01/21/2017  . 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 Cannabis use disorder, severe, dependence ([place]) [F12.20] 01/20/2017  . Mood disorder ([place]) [F39] 01/19/2017  . MDD (major depressive disorder), recurrent severe, without psychosis ([place]) [F33.2] 08/08/2015  . Cocaine use disorder, severe, dependence ([place]) [F14.20] 08/08/2015  . Cigar smoker motivated to quit [F17.290] 07/06/2014   History of Present Illness:    Identifying data. Ms. is a 41 year old female with history of depression, mood instability, and substance abuse.  Chief complaint. "I need to think of suicidal or homicidal."  History of present illness. Information was obtained from the patient and the chart. The patient was brought to the hospital complaining of severe anxiety and thoughts to hurt her boyfriend and herself. She proposed to cut his throat first and first next. This is in the context of his unfaithfulness. The patient reports many symptoms of depression with poor sleep, decreased appetite, anhedonia, feeling of guilt and hopelessness worthlessness, poor energy and concentration, social isolation crying spells and extreme anxiety.  She has frequent severe panic attacks. She was recently hospitalized [facility] but did not take any medications due to financial difficulties. She denies any positive symptoms or symptoms suggestive of bipolar mania. She is rather irritable and readily cooperated on the interview so she does not elaborate much. She does not want to talk about substance abuse.  Past psychiatric history. There is a history of depression and mood instability. She's been tried on numerous medications but does not remember names. In the fall of last year she completed treatment at [facility].   Family psychiatric history. Unknown.  Social history. She lives with her boyfriend who is now unfaithful.  Total Time spent with patient: 1 hour  Is the patient at risk to self? Yes.    Has the patient been a risk to self in the past 6 months? Yes.    Has the patient been a risk to self within the distant past? No.  Is the patient a risk to others? Yes.    Has the patient been a risk to others in the past 6 months? No.  Has the patient been a risk to others within the distant past? No.   Prior Inpatient Therapy:   Prior Outpatient Therapy:    Alcohol Screening: Patient refused Alcohol Screening Tool: Yes Substance Abuse History in the last 12 months:  Yes.   Consequences of Substance Abuse: Negative Previous Psychotropic Medications: Yes  Psychological Evaluations: No  Past Medical History:  Past Medical History:  Diagnosis Date  . MRSA (methicillin resistant Staphylococcus aureus)    surgery on finger 3 years ago  . Substance or medication-induced bipolar and related disorder with onset during intoxication ([place]) 09/08/2016    Past Surgical History:  Procedure Laterality Date  . FINGER SURGERY     Family History:  Family History  Problem Relation Age of Onset  . Diabetes Mother   . Hypertension Mother   . Drug abuse Father   . Schizophrenia Maternal Aunt    Tobacco Screening: Have you used any form of  tobacco in the last 30 days? (Cigarettes, Smokeless Tobacco, Cigars, and/or Pipes): Yes Tobacco use, Select all that apply: 5 or more cigarettes per day Are you interested in Tobacco Cessation Medications?: No, patient refused Counseled patient on smoking cessation including recognizing danger situations, developing coping skills and basic information about quitting provided: Yes  Social History:  History  Alcohol Use  . Yes    Comment: occasional      History  Drug Use  . Types: Methamphetamines, Cocaine    Comment: 30 days sober, just completed treatment at [facility] 8/11    Additional Social History:                           Allergies:  No Known Allergies Lab Results:  Results for orders placed or performed during the hospital encounter of 05/21/17 (from the past 48 hour(s))  Rapid urine drug screen (hospital performed)     Status: Abnormal   Collection Time: 05/21/17  3:22 PM  Result Value Ref Range   Opiates NONE DETECTED NONE DETECTED   Cocaine POSITIVE (A) NONE DETECTED   Benzodiazepines NONE DETECTED NONE DETECTED   Amphetamines NONE DETECTED NONE DETECTED   Tetrahydrocannabinol POSITIVE (A) NONE DETECTED   Barbiturates NONE DETECTED NONE DETECTED    Comment:        DRUG SCREEN FOR MEDICAL PURPOSES ONLY.  IF CONFIRMATION IS NEEDED FOR ANY PURPOSE, NOTIFY LAB WITHIN 5 DAYS.        LOWEST DETECTABLE LIMITS FOR URINE DRUG SCREEN Drug Class       Cutoff (ng/mL) Amphetamine      1000 Barbiturate      200 Benzodiazepine   027 Tricyclics       253 Opiates          300 Cocaine          300 THC              50     Blood Alcohol level:  Lab Results  Component Value Date   ETH <5 05/21/2017   ETH <5 66/44/0347    Metabolic Disorder Labs:  Lab Results  Component Value Date   HGBA1C 5.4 01/21/2017   MPG 108 01/21/2017   No results found for: PROLACTIN Lab Results  Component Value Date   CHOL 153 01/21/2017   TRIG 89 01/21/2017   HDL 58  01/21/2017   CHOLHDL 2.6 01/21/2017   VLDL 18 01/21/2017   LDLCALC 77 01/21/2017    Current Medications: Current Facility-Administered Medications  Medication Dose Route Frequency Provider Last Rate Last Dose  . acetaminophen (TYLENOL) tablet 650 mg  650 mg Oral Q6H PRN Kasarah Sitts B, MD      . alum & mag hydroxide-simeth (MAALOX/MYLANTA) 200-200-20 MG/5ML suspension 30 mL  30 mL Oral Q4H PRN Lamarco Gudiel B, MD      . divalproex (DEPAKOTE) DR tablet 500 mg  500 mg Oral Q8H Keysean Savino B, MD   500 mg at 05/22/17 2328  . gabapentin (NEURONTIN) capsule 300 mg  300 mg Oral TID Dajsha Massaro B, MD   300 mg at 05/23/17 1238  . hydrOXYzine (ATARAX/VISTARIL) tablet 50 mg  50 mg Oral TID PRN Cesare Sumlin, Wardell Honour, MD      .  magnesium hydroxide (MILK OF MAGNESIA) suspension 30 mL  30 mL Oral Daily PRN Dhiren Azimi B, MD      . QUEtiapine (SEROQUEL) tablet 400 mg  400 mg Oral QHS Oval Cavazos B, MD   400 mg at 05/22/17 2329  . QUEtiapine (SEROQUEL) tablet 50 mg  50 mg Oral TID Scotti Motter B, MD      . traZODone (DESYREL) tablet 100 mg  100 mg Oral QHS Mikaylah Libbey B, MD   100 mg at 05/22/17 2329   PTA Medications: Prescriptions Prior to Admission  Medication Sig Dispense Refill Last Dose  . citalopram (CELEXA) 20 MG tablet Take 1 tablet (20 mg total) by mouth daily. For depression (Patient not taking: Reported on 05/21/2017) 30 tablet 0 Not Taking at Unknown time  . divalproex (DEPAKOTE) 250 MG DR tablet Take 1 tablet (250 mg total) by mouth every 8 (eight) hours. For mood stabilization (Patient not taking: Reported on 05/21/2017) 90 tablet 0 Not Taking at Unknown time  . gabapentin (NEURONTIN) 300 MG capsule Take 1 capsule (300 mg total) by mouth 4 (four) times daily. For agitation (Patient not taking: Reported on 05/21/2017) 120 capsule 0 Not Taking at Unknown time  . hydrOXYzine (ATARAX/VISTARIL) 50 MG tablet Take 1 tablet (50 mg total) by mouth  every 6 (six) hours as needed for anxiety. (Patient not taking: Reported on 05/21/2017) 60 tablet 0 Not Taking at Unknown time  . QUEtiapine (SEROQUEL) 25 MG tablet Take 1 tablet (25 mg total) by mouth 3 (three) times daily as needed (severe anxiety/agitation). (Patient not taking: Reported on 05/21/2017) 60 tablet 0 Not Taking at Unknown time  . QUEtiapine (SEROQUEL) 400 MG tablet Take 1 tablet (400 mg total) by mouth at bedtime. For mood control (Patient not taking: Reported on 05/21/2017) 30 tablet 0 Not Taking at Unknown time  . traZODone (DESYREL) 100 MG tablet Take 1 tablet (100 mg total) by mouth at bedtime as needed for sleep. (Patient not taking: Reported on 05/21/2017) 30 tablet 0 Not Taking at Unknown time    Musculoskeletal: Strength & Muscle Tone: within normal limits Gait & Station: normal Patient leans: N/A  Psychiatric Specialty Exam: Physical Exam  Nursing note and vitals reviewed. Constitutional: She is oriented to person, place, and time. She appears well-developed and well-nourished.  HENT:  Head: Normocephalic and atraumatic.  Eyes: Conjunctivae and EOM are normal. Pupils are equal, round, and reactive to light.  Neck: Normal range of motion. Neck supple.  Cardiovascular: Normal rate, regular rhythm and normal heart sounds.   Respiratory: Effort normal and breath sounds normal.  GI: Soft. Bowel sounds are normal.  Musculoskeletal: Normal range of motion.  Neurological: She is alert and oriented to person, place, and time.  Skin: Skin is warm and dry.  Psychiatric: Her mood appears anxious. Her affect is labile. Her speech is delayed. She is slowed and withdrawn. Cognition and memory are normal. She expresses impulsivity. She expresses homicidal and suicidal ideation. She expresses suicidal plans and homicidal plans.    Review of Systems  Psychiatric/Behavioral: Positive for depression, substance abuse and suicidal ideas. The patient is nervous/anxious.   All other  systems reviewed and are negative.   Blood pressure 118/76, pulse 98, temperature 98.3 F (36.8 C), temperature source Oral, resp. rate 16, height 5\' 2"  (1.575 m), weight 61.9 kg (136 lb 6.4 oz), last menstrual period 05/17/2017.Body mass index is 24.95 kg/m.  See SRA.  Sleep:  Number of Hours: 8.25    Treatment Plan Summary: Daily contact with patient to assess and evaluate symptoms and progress in treatment and Medication management   Ms. Sciortino is a 41 year old female with history of depression, mood instability and substance admitted for suicidal and homicidal threats against her boyfriend in the context of relationship problems and medication noncompliance.  1. Suicidal or homicidal ideation. The patient is able to contract for safety in the hospital.   2. Mood. She was restarted on Depakote and Seroquel for depression and mood stabilization.  3. Insomnia. Trazodone is available.  4. Anxiety. Vistaril is available.  5. Metabolic syndrome monitoring. Lipid panel and, TSH and hemoglobin A1c are pending.  6. EKG. Pending.  7. Substance abuse. The patient has a history of cocaine and cannabis abuse. She declines residential treatment.  8. Disposition. She will likely be discharged back with her boyfriend. She will follow up with [facility].    Observation Level/Precautions:  15 minute checks  Laboratory:  CBC Chemistry Profile UDS UA  Psychotherapy:    Medications:    Consultations:    Discharge Concerns:    Estimated LOS:  Other:     Physician Treatment Plan for Primary Diagnosis: Major depressive disorder, recurrent severe without psychotic features ([place]) Long Term Goal(s): Improvement in symptoms so as ready for discharge  Short Term Goals: Ability to identify changes in lifestyle to reduce recurrence of condition will improve, Ability to verbalize feelings will improve, Ability to disclose and  discuss suicidal ideas, Ability to demonstrate self-control will improve, Ability to identify and develop effective coping behaviors will improve, Compliance with prescribed medications will improve and Ability to identify triggers associated with substance abuse/mental health issues will improve  Physician Treatment Plan for Secondary Diagnosis: Principal Problem:   Major depressive disorder, recurrent severe without psychotic features ([place]) Active Problems:   Cocaine use disorder, severe, dependence ([place])   Cannabis use disorder, severe, dependence ([place])   Suicidal ideations   Tobacco use disorder  Long Term Goal(s): Improvement in symptoms so as ready for discharge  Short Term Goals: Ability to identify changes in lifestyle to reduce recurrence of condition will improve, Ability to demonstrate self-control will improve and Ability to identify triggers associated with substance abuse/mental health issues will improve  I certify that inpatient services furnished can reasonably be expected to improve the patient's condition.    Orson Slick, MD 5/27/20181:50 PM

## 2017-05-23 NOTE — Progress Notes (Signed)
Pt admitted to [facility]U from [place] requesting something to eat and drink on arrival. Pt allowed V/S to be taken but refused skin assessment. Pt sitting in chair with head covered with blanket. When spoken to pt became very loud and verbally abusive toward staff, pt reporting she was hungry, cold and very tired. Pt belongings inventoried and placed in locker. Pt refused orientation to the unit and only wanted to know where her room was located so she could lay down. Pt gait noted to be steady and no fall risk identified. Pt assessment completed using documentation from assessment from [facility] due to pt refusing to answer questions. Pt. Given sandwich tray and drink. Pt did report HI/ SI that she wanted to kill her boyfriend and herself, but did verbally contract for safety. Pt di take HS medications without difficulty. Patient safety will be maintained.

## 2017-05-23 NOTE — BHH Suicide Risk Assessment (Signed)
Deborah Melendez   Nursing information obtained from:    Demographic factors:    Current Mental Status:    Loss Factors:    Historical Factors:    Risk Reduction Factors:     Total Time spent with patient: 1 hour Principal Problem: Major depressive disorder, recurrent severe without psychotic features ([place]) Diagnosis:   Patient Active Problem List   Diagnosis Date Noted  . Tobacco use disorder [F17.200] 05/22/2017  . Major depressive disorder, recurrent severe without psychotic features ([place]) [F33.2] 05/22/2017  . Homicidal ideations [R45.850]   . Suicidal ideations [R45.851]   . Agitation [R45.1]   . Adjustment disorder with depressed mood [F43.21] 02/10/2017  . Bipolar 1 disorder, depressed, severe ([place]) [F31.4] 02/04/2017  . Panic attacks [F41.0] 01/21/2017  . 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 Cannabis use disorder, severe, dependence ([place]) [F12.20] 01/20/2017  . Mood disorder ([place]) [F39] 01/19/2017  . MDD (major depressive disorder), recurrent severe, without psychosis ([place]) [F33.2] 08/08/2015  . Cocaine use disorder, severe, dependence ([place]) [F14.20] 08/08/2015  . Cigar smoker motivated to quit [F17.290] 07/06/2014   Subjective Data: suicidal/homicidal ideation.  Continued Clinical Symptoms:    The "Alcohol Use Disorders Identification Test", Guidelines for Use in Primary Care, Second Edition.  World Pharmacologist [facility]). Score between 0-7:  no or low risk or alcohol related problems. Score between 8-15:  moderate risk of alcohol related problems. Score between 16-19:  high risk of alcohol related problems. Score 20 or above:  warrants further diagnostic evaluation for alcohol dependence and treatment.   CLINICAL FACTORS:   Severe Anxiety and/or Agitation Depression:   Comorbid alcohol abuse/dependence Impulsivity Alcohol/Substance  Abuse/Dependencies Unstable or Poor Therapeutic Relationship Previous Psychiatric Diagnoses and Treatments   Musculoskeletal: Strength & Muscle Tone: within normal limits Gait & Station: normal Patient leans: N/A  Psychiatric Specialty Exam: Physical Exam  Nursing note and vitals reviewed. Psychiatric: Her affect is blunt. Her speech is delayed. She is slowed and withdrawn. Cognition and memory are normal. She expresses impulsivity. She exhibits a depressed mood. She expresses homicidal and suicidal ideation. She expresses suicidal plans and homicidal plans.    Review of Systems  Psychiatric/Behavioral: Positive for depression, substance abuse and suicidal ideas.  All other systems reviewed and are negative.   Blood pressure 118/76, pulse 98, temperature 98.3 F (36.8 C), temperature source Oral, resp. rate 16, height 5\' 2"  (1.575 m), weight 61.9 kg (136 lb 6.4 oz), last menstrual period 05/17/2017.Body mass index is 24.95 kg/m.  General Appearance: Disheveled  Eye Contact:  Poor  Speech:  Slow and Slurred  Volume:  Decreased  Mood:  Anxious and Irritable  Affect:  Labile  Thought Process:  Goal Directed and Descriptions of Associations: Intact  Orientation:  Full (Time, Place, and Person)  Thought Content:  WDL  Suicidal Thoughts:  No  Homicidal Thoughts:  No  Memory:  Immediate;   Fair Recent;   Fair Remote;   Fair  Judgement:  Poor  Insight:  Lacking  Psychomotor Activity:  Psychomotor Retardation  Concentration:  Concentration: Fair and Attention Span: Fair  Recall:  [employer] of Knowledge:  Fair  Language:  Fair  Akathisia:  No  Handed:  Right  AIMS (if indicated):     Assets:  Communication Skills Desire for Improvement Housing Physical Health Resilience Social Support  ADL's:  Intact  Cognition:  WNL  Sleep:  Number of Hours: 8.25  COGNITIVE FEATURES THAT CONTRIBUTE TO RISK:  None    SUICIDE RISK:   Severe:  Frequent, intense, and enduring  suicidal ideation, specific plan, no subjective intent, but some objective markers of intent (i.e., choice of lethal method), the method is accessible, some limited preparatory behavior, evidence of impaired self-control, severe dysphoria/symptomatology, multiple risk factors present, and few if any protective factors, particularly a lack of social support.  PLAN OF CARE: Hospital admission, medication management, abstinence abuse counseling, discharge planning.  Deborah Melendez is a 41 year old female with history of depression, mood instability and substance admitted for suicidal and homicidal threats against her boyfriend in the context of relationship problems and medication noncompliance.  1. Suicidal or homicidal ideation. The patient is able to contract for safety in the hospital.   2. Mood. She was restarted on Depakote and Seroquel for depression and mood stabilization.  3. Insomnia. Trazodone is available.  4. Anxiety. Vistaril is available.  5. Metabolic syndrome monitoring. Lipid panel and, TSH and hemoglobin A1c are pending.  6. EKG. Pending.  7. Substance abuse. The patient has a history of cocaine and cannabis abuse. She declines residential treatment.  8. Disposition. She will likely be discharged back with her boyfriend. She will follow up with [facility].     I certify that inpatient services furnished can reasonably be expected to improve the patient's condition.   Orson Slick, MD 05/23/2017, 1:35 PM

## 2017-05-23 NOTE — Tx Team (Signed)
Initial Treatment Plan 05/23/2017 3:47 AM [name] KNL:[id number]    PATIENT STRESSORS: Marital or family conflict Substance abuse   PATIENT STRENGTHS: Agricultural engineer for treatment/growth   PATIENT IDENTIFIED PROBLEMS:                      DISCHARGE CRITERIA:  Improved stabilization in mood, thinking, and/or behavior Motivation to continue treatment in a less acute level of care  PRELIMINARY DISCHARGE PLAN: Outpatient therapy Participate in family therapy  PATIENT/FAMILY INVOLVEMENT: This treatment plan has been presented to and reviewed with the patient, [name]. The patient has been given the opportunity to ask questions and make suggestions.  Hope Pigeon, RN 05/23/2017, 3:47 AM

## 2017-05-23 NOTE — Plan of Care (Signed)
Problem: Health Behavior/Discharge Planning: Goal: Ability to manage health-related needs will improve Outcome: Progressing Med compliant with encouragement

## 2017-05-23 NOTE — Progress Notes (Signed)
Pt. Sleeping in room at shift change.  This nurse and Leafy Ro, RN attempted to wake pt. For skin assessment and to notify her that breakfast was available.   Pt. Awoke angry and agitated, initially refusing her skin assessment and breakfast if she would have to go to the dayroom to get it.  She yelled expletives at nurses, but eventually complied with skin assessment with strong encouragement.  Multiple small scars consistent with acne scars were noted on upper and lower back and face, otherwise skin unremarkable.  No contraband found.  Pt. Adamantly refused going to day room for breakfast because she "didnt want to be around these people" but eventually went for breakfast.  Pt. Immediately returned to room and fell back asleep.  RN was unable to wake pt. For am meds despite multiple attempts.  Breathing observed, even and unlabored.  Unable to admin morning meds or assess at this time, but will continue to attempt.    Pt. Wakes up to eat lunch.  Accepted 1200 meds but would not get out of bed/come to med room for them, demanded they be brought to her.  Pt. Is in a significantly better mood, pleasant and respectful during this interaction.  Stays in bed, asks RN to wake her up when its time for dinner.   After dinner, pt. Reported she was nauseated and thought she might vomit.  Walked independently to room to lay down.  Refused fluids or anything else.  RN checked on patient shortly after this.  Pt. Denied vomiting. Pt. Reported she felt good enough to take 1700 meds - administered.  Pt. Remains in room, resting comfortably, respirations even and unlabored.

## 2017-05-23 NOTE — [facility] Group Notes (Signed)
[facility] LCSW Group Therapy  05/23/2017 2:30 PM  Type of Therapy:  Group Therapy  Participation Level:  Did Not Attend  Summary of Progress/Problems: Communications: Patients identify how individuals communicate with one another appropriately and inappropriately. Patients will be guided to discuss their thoughts, feelings, and behaviors related to barriers when communicating. The group will process together ways to execute positive and appropriate communications.   Deborah Melendez G. [place], [place] 05/23/2017, 2:31 PM

## 2017-05-24 NOTE — Progress Notes (Signed)
Patient stayed in the bed most of the day.Patient verbalized passive suicidal thoughts and homicidal towards her boyfriend.Patient easily get irritated with no reason.Mood is very labile.Did not attended any groups.Compliant with medications.Patient reluctant to answer questions.Support & encouragement given.

## 2017-05-24 NOTE — Tx Team (Signed)
Interdisciplinary Treatment and Diagnostic Plan Update  05/24/2017 Time of Session: 10:30 AM Deborah Melendez MRN: [id number]  Principal Diagnosis: Major depressive disorder, recurrent severe without psychotic features ([place])  Secondary Diagnoses: Principal Problem:   Major depressive disorder, recurrent severe without psychotic features ([place]) Active Problems:   Cocaine use disorder, severe, dependence ([place])   Cannabis use disorder, severe, dependence ([place])   Suicidal ideations   Tobacco use disorder   Current Medications:  Current Facility-Administered Medications  Medication Dose Route Frequency Provider Last Rate Last Dose  . acetaminophen (TYLENOL) tablet 650 mg  650 mg Oral Q6H PRN Pucilowska, Jolanta B, MD      . alum & mag hydroxide-simeth (MAALOX/MYLANTA) 200-200-20 MG/5ML suspension 30 mL  30 mL Oral Q4H PRN Pucilowska, Jolanta B, MD      . divalproex (DEPAKOTE) DR tablet 500 mg  500 mg Oral Q8H Pucilowska, Jolanta B, MD   500 mg at 05/24/17 1102  . gabapentin (NEURONTIN) capsule 300 mg  300 mg Oral TID Pucilowska, Jolanta B, MD   300 mg at 05/24/17 1101  . hydrOXYzine (ATARAX/VISTARIL) tablet 50 mg  50 mg Oral TID PRN Pucilowska, Jolanta B, MD   50 mg at 05/23/17 2036  . magnesium hydroxide (MILK OF MAGNESIA) suspension 30 mL  30 mL Oral Daily PRN Pucilowska, Jolanta B, MD      . QUEtiapine (SEROQUEL) tablet 400 mg  400 mg Oral QHS Pucilowska, Jolanta B, MD   400 mg at 05/23/17 2112  . QUEtiapine (SEROQUEL) tablet 50 mg  50 mg Oral TID Pucilowska, Jolanta B, MD   50 mg at 05/24/17 1101  . traZODone (DESYREL) tablet 100 mg  100 mg Oral QHS Pucilowska, Jolanta B, MD   100 mg at 05/23/17 2111   PTA Medications: Prescriptions Prior to Admission  Medication Sig Dispense Refill Last Dose  . citalopram (CELEXA) 20 MG tablet Take 1 tablet (20 mg total) by mouth daily. For depression (Patient not taking: Reported on 05/21/2017) 30 tablet 0 Not Taking at Unknown time  . divalproex  (DEPAKOTE) 250 MG DR tablet Take 1 tablet (250 mg total) by mouth every 8 (eight) hours. For mood stabilization (Patient not taking: Reported on 05/21/2017) 90 tablet 0 Not Taking at Unknown time  . gabapentin (NEURONTIN) 300 MG capsule Take 1 capsule (300 mg total) by mouth 4 (four) times daily. For agitation (Patient not taking: Reported on 05/21/2017) 120 capsule 0 Not Taking at Unknown time  . hydrOXYzine (ATARAX/VISTARIL) 50 MG tablet Take 1 tablet (50 mg total) by mouth every 6 (six) hours as needed for anxiety. (Patient not taking: Reported on 05/21/2017) 60 tablet 0 Not Taking at Unknown time  . QUEtiapine (SEROQUEL) 25 MG tablet Take 1 tablet (25 mg total) by mouth 3 (three) times daily as needed (severe anxiety/agitation). (Patient not taking: Reported on 05/21/2017) 60 tablet 0 Not Taking at Unknown time  . QUEtiapine (SEROQUEL) 400 MG tablet Take 1 tablet (400 mg total) by mouth at bedtime. For mood control (Patient not taking: Reported on 05/21/2017) 30 tablet 0 Not Taking at Unknown time  . traZODone (DESYREL) 100 MG tablet Take 1 tablet (100 mg total) by mouth at bedtime as needed for sleep. (Patient not taking: Reported on 05/21/2017) 30 tablet 0 Not Taking at Unknown time    Patient Stressors: Marital or family conflict Substance abuse  Patient Strengths: Agricultural engineer for treatment/growth  Treatment Modalities: Medication Management, Group therapy, Case management,  1 to 1 session with clinician, Psychoeducation,  Recreational therapy.   Physician Treatment Plan for Primary Diagnosis: Major depressive disorder, recurrent severe without psychotic features ([place]) Long Term Goal(s): Improvement in symptoms so as ready for discharge Improvement in symptoms so as ready for discharge   Short Term Goals: Ability to identify changes in lifestyle to reduce recurrence of condition will improve Ability to verbalize feelings will improve Ability to disclose and discuss  suicidal ideas Ability to demonstrate self-control will improve Ability to identify and develop effective coping behaviors will improve Compliance with prescribed medications will improve Ability to identify triggers associated with substance abuse/mental health issues will improve Ability to identify changes in lifestyle to reduce recurrence of condition will improve Ability to demonstrate self-control will improve Ability to identify triggers associated with substance abuse/mental health issues will improve  Medication Management: Evaluate patient's response, side effects, and tolerance of medication regimen.  Therapeutic Interventions: 1 to 1 sessions, Unit Group sessions and Medication administration.  Evaluation of Outcomes: Progressing  Physician Treatment Plan for Secondary Diagnosis: Principal Problem:   Major depressive disorder, recurrent severe without psychotic features ([place]) Active Problems:   Cocaine use disorder, severe, dependence ([place])   Cannabis use disorder, severe, dependence ([place])   Suicidal ideations   Tobacco use disorder  Long Term Goal(s): Improvement in symptoms so as ready for discharge Improvement in symptoms so as ready for discharge   Short Term Goals: Ability to identify changes in lifestyle to reduce recurrence of condition will improve Ability to verbalize feelings will improve Ability to disclose and discuss suicidal ideas Ability to demonstrate self-control will improve Ability to identify and develop effective coping behaviors will improve Compliance with prescribed medications will improve Ability to identify triggers associated with substance abuse/mental health issues will improve Ability to identify changes in lifestyle to reduce recurrence of condition will improve Ability to demonstrate self-control will improve Ability to identify triggers associated with substance abuse/mental health issues will improve     Medication Management:  Evaluate patient's response, side effects, and tolerance of medication regimen.  Therapeutic Interventions: 1 to 1 sessions, Unit Group sessions and Medication administration.  Evaluation of Outcomes: Progressing   RN Treatment Plan for Primary Diagnosis: Major depressive disorder, recurrent severe without psychotic features ([place]) Long Term Goal(s): Knowledge of disease and therapeutic regimen to maintain health will improve  Short Term Goals: Ability to remain free from injury will improve, Ability to verbalize feelings will improve, Ability to disclose and discuss suicidal ideas and Compliance with prescribed medications will improve  Medication Management: RN will administer medications as ordered by provider, will assess and evaluate patient's response and provide education to patient for prescribed medication. RN will report any adverse and/or side effects to prescribing provider.  Therapeutic Interventions: 1 on 1 counseling sessions, Psychoeducation, Medication administration, Evaluate responses to treatment, Monitor vital signs and CBGs as ordered, Perform/monitor CIWA, COWS, AIMS and Fall Risk screenings as ordered, Perform wound care treatments as ordered.  Evaluation of Outcomes: Progressing   LCSW Treatment Plan for Primary Diagnosis: Major depressive disorder, recurrent severe without psychotic features ([place]) Long Term Goal(s): Safe transition to appropriate next level of care at discharge, Engage patient in therapeutic group addressing interpersonal concerns.  Short Term Goals: Engage patient in aftercare planning with referrals and resources, Increase social support, Increase ability to appropriately verbalize feelings and Increase emotional regulation  Therapeutic Interventions: Assess for all discharge needs, 1 to 1 time with Social worker, Explore available resources and support systems, Assess for adequacy in community support network, Educate family  and significant  other(s) on suicide prevention, Complete Psychosocial Assessment, Interpersonal group therapy.  Evaluation of Outcomes: Progressing   Progress in Treatment: Attending groups: No. Participating in groups: No. Taking medication as prescribed: Yes. Toleration medication: Yes. Family/Significant other contact made: No, will contact:  CSW assessing proper family contact. Patient understands diagnosis: Yes. Discussing patient identified problems/goals with staff: Yes. Medical problems stabilized or resolved: Yes. Denies suicidal/homicidal ideation: No. Issues/concerns per patient self-inventory: No.  New problem(s) identified: No, Describe:  None identified.  New Short Term/Long Term Goal(s): Patient stated that her goal is to have "medications increased."  Discharge Plan or Barriers: CSW assessing proper aftercare plans.  Reason for Continuation of Hospitalization: Depression Homicidal ideation Suicidal ideation  Estimated Length of Stay: 3-5 days   Attendees: Patient: Deborah Melendez  05/24/2017 11:42 AM  Physician: Dr. Orson Slick, MD  05/24/2017 11:42 AM  Nursing: Elige Radon, BSN, RN  05/24/2017 11:42 AM  RN Care Manager: 05/24/2017 11:42 AM  Social Worker: Glorious Peach, MSW, LCSW-A 05/24/2017 11:42 AM  Recreational Therapist: Drue Flirt, [place], [place]  05/24/2017 11:42 AM  Other:  05/24/2017 11:42 AM  Other:  05/24/2017 11:42 AM  Other: 05/24/2017 11:42 AM    Scribe for Treatment Team: Emilie Rutter, [place] 05/24/2017 11:42 AM
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
## 2017-05-24 NOTE — Progress Notes (Signed)
[facility] MD Progress Note  05/24/2017 8:17 PM Mieko Kneebone  MRN:  [id number]  Subjective:   05/24/2017. Ms. Akey was unwilling to come to treatment team meeting today but agreed to meet with [place] in her room. She is heart broken and still suicidal and homicidal, tearful but pleasant. She is asking for more medicines. There are no somatic complaints. She is in bed all the time.  05/25/2017.  Per nursing:  Principal Problem: Major depressive disorder, recurrent severe without psychotic features ([place]) Diagnosis:   Patient Active Problem List   Diagnosis Date Noted  . Tobacco use disorder [F17.200] 05/22/2017  . Major depressive disorder, recurrent severe without psychotic features ([place]) [F33.2] 05/22/2017  . Homicidal ideations [R45.850]   . Suicidal ideations [R45.851]   . Agitation [R45.1]   . Adjustment disorder with depressed mood [F43.21] 02/10/2017  . Bipolar 1 disorder, depressed, severe ([place]) [F31.4] 02/04/2017  . Panic attacks [F41.0] 01/21/2017  . 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 Cannabis use disorder, severe, dependence ([place]) [F12.20] 01/20/2017  . Mood disorder ([place]) [F39] 01/19/2017  . MDD (major depressive disorder), recurrent severe, without psychosis ([place]) [F33.2] 08/08/2015  . Cocaine use disorder, severe, dependence ([place]) [F14.20] 08/08/2015  . Cigar smoker motivated to quit [F17.290] 07/06/2014   Total Time spent with patient: 30 minutes  Past Psychiatric History: depression, substance abuse.  Past Medical History:  Past Medical History:  Diagnosis Date  . MRSA (methicillin resistant Staphylococcus aureus)    surgery on finger 3 years ago  . Substance or medication-induced bipolar and related disorder with onset during intoxication ([place]) 09/08/2016    Past Surgical History:  Procedure Laterality Date  . FINGER SURGERY     Family History:  Family History  Problem  Relation Age of Onset  . Diabetes Mother   . Hypertension Mother   . Drug abuse Father   . Schizophrenia Maternal Aunt    Family Psychiatric  History: See H&P. Social History:  History  Alcohol Use  . Yes    Comment: occasional      History  Drug Use  . Types: Methamphetamines, Cocaine    Comment: 30 days sober, just completed treatment at [facility] 8/11    Social History   Social History  . Marital status: Single    Spouse name: N/A  . Number of children: N/A  . Years of education: N/A   Social History Main Topics  . Smoking status: Current Every Day Smoker    Packs/day: 0.50    Types: Cigarettes  . Smokeless tobacco: Never Used     Comment: pt reported quiting three weeks ago  . Alcohol use Yes     Comment: occasional   . Drug use: Yes    Types: Methamphetamines, Cocaine     Comment: 30 days sober, just completed treatment at [facility] 8/11  . Sexual activity: Yes    Birth control/ protection: None   Other Topics Concern  . None   Social History Narrative  . None   Additional Social History:                         Sleep: Fair  Appetite:  Fair  Current Medications: Current Facility-Administered Medications  Medication Dose Route Frequency Provider Last Rate Last Dose  . acetaminophen (TYLENOL) tablet 650 mg  650 mg Oral Q6H PRN Nikkita Adeyemi B, MD      . alum & mag hydroxide-simeth (  MAALOX/MYLANTA) 200-200-20 MG/5ML suspension 30 mL  30 mL Oral Q4H PRN Astrid Vides B, MD      . divalproex (DEPAKOTE) DR tablet 500 mg  500 mg Oral Q8H Makiyah Zentz B, MD   500 mg at 05/24/17 1549  . gabapentin (NEURONTIN) capsule 300 mg  300 mg Oral TID Jerrick Farve B, MD   300 mg at 05/24/17 1646  . hydrOXYzine (ATARAX/VISTARIL) tablet 50 mg  50 mg Oral TID PRN Janneth Krasner B, MD   50 mg at 05/23/17 2036  . magnesium hydroxide (MILK OF MAGNESIA) suspension 30 mL  30 mL Oral Daily PRN Taeya Theall B, MD      . QUEtiapine  (SEROQUEL) tablet 400 mg  400 mg Oral QHS Cyla Haluska B, MD   400 mg at 05/23/17 2112  . QUEtiapine (SEROQUEL) tablet 50 mg  50 mg Oral TID Kihanna Kamiya B, MD   50 mg at 05/24/17 1645  . traZODone (DESYREL) tablet 100 mg  100 mg Oral QHS Paulina Muchmore B, MD   100 mg at 05/23/17 2111    Lab Results: No results found for this or any previous visit (from the past 48 hour(s)).  Blood Alcohol level:  Lab Results  Component Value Date   ETH <5 05/21/2017   ETH <5 90/30/0923    Metabolic Disorder Labs: Lab Results  Component Value Date   HGBA1C 5.4 01/21/2017   MPG 108 01/21/2017   No results found for: PROLACTIN Lab Results  Component Value Date   CHOL 153 01/21/2017   TRIG 89 01/21/2017   HDL 58 01/21/2017   CHOLHDL 2.6 01/21/2017   VLDL 18 01/21/2017   LDLCALC 77 01/21/2017    Physical Findings: AIMS: Facial and Oral Movements Muscles of Facial Expression: None, normal Lips and Perioral Area: None, normal Jaw: None, normal Tongue: None, normal,Extremity Movements Upper (arms, wrists, hands, fingers): None, normal Lower (legs, knees, ankles, toes): None, normal, Trunk Movements Neck, shoulders, hips: None, normal, Overall Severity Severity of abnormal movements (highest score from questions above): None, normal Incapacitation due to abnormal movements: None, normal Patient's awareness of abnormal movements (rate only patient's report): No Awareness, Dental Status Current problems with teeth and/or dentures?: No Does patient usually wear dentures?: No  CIWA:  CIWA-Ar Total: 3 COWS:  COWS Total Score: 6  Musculoskeletal: Strength & Muscle Tone: within normal limits Gait & Station: normal Patient leans: N/A  Psychiatric Specialty Exam: Physical Exam  Nursing note and vitals reviewed. Psychiatric: Her affect is blunt. Her speech is delayed. She is withdrawn. Thought content is paranoid. Cognition and memory are normal. She expresses impulsivity. She  exhibits a depressed mood. She expresses homicidal and suicidal ideation.    Review of Systems  Psychiatric/Behavioral: Positive for depression, substance abuse and suicidal ideas.  All other systems reviewed and are negative.   Blood pressure 118/76, pulse 98, temperature 98.3 F (36.8 C), temperature source Oral, resp. rate 16, height 5\' 2"  (1.575 m), weight 61.9 kg (136 lb 6.4 oz), last menstrual period 05/17/2017.Body mass index is 24.95 kg/m.  General Appearance: Disheveled  Eye Contact:  Minimal  Speech:  Slow  Volume:  Decreased  Mood:  Anxious, Depressed, Hopeless and Irritable  Affect:  Blunt  Thought Process:  Goal Directed and Descriptions of Associations: Intact  Orientation:  Full (Time, Place, and Person)  Thought Content:  WDL  Suicidal Thoughts:  Yes.  with intent/plan  Homicidal Thoughts:  Yes.  with intent/plan  Memory:  Immediate;  Fair Recent;   Fair Remote;   Fair  Judgement:  Poor  Insight:  Lacking  Psychomotor Activity:  Psychomotor Retardation  Concentration:  Concentration: Fair and Attention Span: Fair  Recall:  [employer] of Knowledge:  Fair  Language:  Fair  Akathisia:  No  Handed:  Right  AIMS (if indicated):     Assets:  Communication Skills Desire for Improvement Physical Health Resilience Social Support  ADL's:  Intact  Cognition:  WNL  Sleep:  Number of Hours: 8.25     Treatment Plan Summary: Daily contact with patient to assess and evaluate symptoms and progress in treatment and Medication management   Deborah Melendez is a 41 year old female with history of depression, mood instability and substance admitted for suicidal and homicidal threats against her boyfriend in the context of relationship problems and medication noncompliance.  1. Suicidal or homicidal ideation. The patient is able to contract for safety in the hospital.   2. Mood. She was restarted on Depakote and Seroquel for depression and mood stabilization.  3.  Insomnia. Trazodone is available.  4. Anxiety. Vistaril is available.  5. Metabolic syndrome monitoring. Lipid panel and, TSH and hemoglobin A1c are pending.  6. EKG. Pending.  7. Substance abuse. The patient has a history of cocaine and cannabis abuse. She declines residential treatment.  8. Disposition. She will likely be discharged back with her boyfriend. She will follow up with [facility].   Orson Slick, MD 05/24/2017, 8:17 PM

## 2017-05-24 NOTE — Progress Notes (Signed)
Recreation Therapy Notes  At approximately 9:45 am, LRT attempted assessment. Patient sleeping and did not wake up when name was called.  Leonette [facility], LRT/CTRS 05/24/2017 2:17 PM

## 2017-05-24 NOTE — BHH Counselor (Signed)
CSW attempted to complete psychosocial assessment with patient. Patient unable to participate in assessment and stated that she is "too tired." CSW will attempt to meet with patient again later today.  Glorious Peach, MSW, LCSW-A 05/24/2017, 10:09 AM

## 2017-05-24 NOTE — Progress Notes (Signed)
[facility] MD Progress Note  05/24/2017 8:25 AM Keirstyn Aydt  MRN:  [id number]  Subjective:   05/24/2017. Ms. Mogle was unwilling to come to treatment team meeting today but agreed to meet with [place] in her room. She is heart broken and still suicidal and homicidal, tearful but pleasant. She is asking for more medicines. There are no somatic complaints. She is in bed all the time.  Per nursing: D: Pt has been very loud with several sudden outbursts and impulsive behaviors. Pt complained of severe anxiety, depression and SI; "I found out that my boyfriend-the guy that I thought will be my husband had been cheating on me; I really just feel like hurting myself right now." Pt contracts for safety. Pt denied pain, HI or AVH. A: medication offered as prescribed. All Pt questions and concerns addressed. Support, encouragement, and safe environment provided.  Pt's room reassessed for safety. 15-minute safety checks continue. R: Pt was med compliant.  Pt did not attend wrap-up group. Safety checks continue.  Principal Problem: Major depressive disorder, recurrent severe without psychotic features ([place]) Diagnosis:   Patient Active Problem List   Diagnosis Date Noted  . Tobacco use disorder [F17.200] 05/22/2017  . Major depressive disorder, recurrent severe without psychotic features ([place]) [F33.2] 05/22/2017  . Homicidal ideations [R45.850]   . Suicidal ideations [R45.851]   . Agitation [R45.1]   . Adjustment disorder with depressed mood [F43.21] 02/10/2017  . Bipolar 1 disorder, depressed, severe ([place]) [F31.4] 02/04/2017  . Panic attacks [F41.0] 01/21/2017  . 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 Cannabis use disorder, severe, dependence ([place]) [F12.20] 01/20/2017  . Mood disorder ([place]) [F39] 01/19/2017  . MDD (major depressive disorder), recurrent severe, without psychosis ([place]) [F33.2] 08/08/2015  . Cocaine use  disorder, severe, dependence ([place]) [F14.20] 08/08/2015  . Cigar smoker motivated to quit [F17.290] 07/06/2014   Total Time spent with patient: 30 minutes  Past Psychiatric History: depression, substance abuse.  Past Medical History:  Past Medical History:  Diagnosis Date  . MRSA (methicillin resistant Staphylococcus aureus)    surgery on finger 3 years ago  . Substance or medication-induced bipolar and related disorder with onset during intoxication ([place]) 09/08/2016    Past Surgical History:  Procedure Laterality Date  . FINGER SURGERY     Family History:  Family History  Problem Relation Age of Onset  . Diabetes Mother   . Hypertension Mother   . Drug abuse Father   . Schizophrenia Maternal Aunt    Family Psychiatric  History: See H&P. Social History:  History  Alcohol Use  . Yes    Comment: occasional      History  Drug Use  . Types: Methamphetamines, Cocaine    Comment: 30 days sober, just completed treatment at [facility] 8/11    Social History   Social History  . Marital status: Single    Spouse name: N/A  . Number of children: N/A  . Years of education: N/A   Social History Main Topics  . Smoking status: Current Every Day Smoker    Packs/day: 0.50    Types: Cigarettes  . Smokeless tobacco: Never Used     Comment: pt reported quiting three weeks ago  . Alcohol use Yes     Comment: occasional   . Drug use: Yes    Types: Methamphetamines, Cocaine     Comment: 30 days sober, just completed treatment at [facility] 8/11  . Sexual activity: Yes  Birth control/ protection: None   Other Topics Concern  . None   Social History Narrative  . None   Additional Social History:                         Sleep: Fair  Appetite:  Fair  Current Medications: Current Facility-Administered Medications  Medication Dose Route Frequency Provider Last Rate Last Dose  . acetaminophen (TYLENOL) tablet 650 mg  650 mg Oral Q6H PRN Reda Citron B, MD       . alum & mag hydroxide-simeth (MAALOX/MYLANTA) 200-200-20 MG/5ML suspension 30 mL  30 mL Oral Q4H PRN Sekai Nayak B, MD      . divalproex (DEPAKOTE) DR tablet 500 mg  500 mg Oral Q8H Navon Kotowski B, MD   500 mg at 05/23/17 2112  . gabapentin (NEURONTIN) capsule 300 mg  300 mg Oral TID Donalda Job B, MD   300 mg at 05/23/17 1749  . hydrOXYzine (ATARAX/VISTARIL) tablet 50 mg  50 mg Oral TID PRN Quaran Kedzierski B, MD   50 mg at 05/23/17 2036  . magnesium hydroxide (MILK OF MAGNESIA) suspension 30 mL  30 mL Oral Daily PRN Raynaldo Falco B, MD      . QUEtiapine (SEROQUEL) tablet 400 mg  400 mg Oral QHS Brentlee Delage B, MD   400 mg at 05/23/17 2112  . QUEtiapine (SEROQUEL) tablet 50 mg  50 mg Oral TID Daneille Desilva B, MD   50 mg at 05/23/17 1749  . traZODone (DESYREL) tablet 100 mg  100 mg Oral QHS Kolbey Teichert B, MD   100 mg at 05/23/17 2111    Lab Results: No results found for this or any previous visit (from the past 48 hour(s)).  Blood Alcohol level:  Lab Results  Component Value Date   ETH <5 05/21/2017   ETH <5 25/85/2778    Metabolic Disorder Labs: Lab Results  Component Value Date   HGBA1C 5.4 01/21/2017   MPG 108 01/21/2017   No results found for: PROLACTIN Lab Results  Component Value Date   CHOL 153 01/21/2017   TRIG 89 01/21/2017   HDL 58 01/21/2017   CHOLHDL 2.6 01/21/2017   VLDL 18 01/21/2017   LDLCALC 77 01/21/2017    Physical Findings: AIMS: Facial and Oral Movements Muscles of Facial Expression: None, normal Lips and Perioral Area: None, normal Jaw: None, normal Tongue: None, normal,Extremity Movements Upper (arms, wrists, hands, fingers): None, normal Lower (legs, knees, ankles, toes): None, normal, Trunk Movements Neck, shoulders, hips: None, normal, Overall Severity Severity of abnormal movements (highest score from questions above): None, normal Incapacitation due to abnormal movements: None,  normal Patient's awareness of abnormal movements (rate only patient's report): No Awareness, Dental Status Current problems with teeth and/or dentures?: No Does patient usually wear dentures?: No  CIWA:  CIWA-Ar Total: 3 COWS:  COWS Total Score: 6  Musculoskeletal: Strength & Muscle Tone: within normal limits Gait & Station: normal Patient leans: N/A  Psychiatric Specialty Exam: Physical Exam  Nursing note and vitals reviewed. Psychiatric: Her affect is blunt. Her speech is delayed. She is withdrawn. Thought content is paranoid. Cognition and memory are normal. She expresses impulsivity. She exhibits a depressed mood. She expresses homicidal and suicidal ideation.    Review of Systems  Psychiatric/Behavioral: Positive for depression, substance abuse and suicidal ideas.  All other systems reviewed and are negative.   Blood pressure 118/76, pulse 98, temperature 98.3 F (36.8 C), temperature source Oral, resp.  rate 16, height 5\' 2"  (1.575 m), weight 61.9 kg (136 lb 6.4 oz), last menstrual period 05/17/2017.Body mass index is 24.95 kg/m.  General Appearance: Disheveled  Eye Contact:  Minimal  Speech:  Slow  Volume:  Decreased  Mood:  Anxious, Depressed, Hopeless and Irritable  Affect:  Blunt  Thought Process:  Goal Directed and Descriptions of Associations: Intact  Orientation:  Full (Time, Place, and Person)  Thought Content:  WDL  Suicidal Thoughts:  Yes.  with intent/plan  Homicidal Thoughts:  Yes.  with intent/plan  Memory:  Immediate;   Fair Recent;   Fair Remote;   Fair  Judgement:  Poor  Insight:  Lacking  Psychomotor Activity:  Psychomotor Retardation  Concentration:  Concentration: Fair and Attention Span: Fair  Recall:  [employer] of Knowledge:  Fair  Language:  Fair  Akathisia:  No  Handed:  Right  AIMS (if indicated):     Assets:  Communication Skills Desire for Improvement Physical Health Resilience Social Support  ADL's:  Intact  Cognition:  WNL   Sleep:  Number of Hours: 8.25     Treatment Plan Summary: Daily contact with patient to assess and evaluate symptoms and progress in treatment and Medication management   Ms. Eifler is a 41 year old female with history of depression, mood instability and substance admitted for suicidal and homicidal threats against her boyfriend in the context of relationship problems and medication noncompliance.  1. Suicidal or homicidal ideation. The patient is able to contract for safety in the hospital.   2. Mood. She was restarted on Depakote and Seroquel for depression and mood stabilization.  3. Insomnia. Trazodone is available.  4. Anxiety. Vistaril is available.  5. Metabolic syndrome monitoring. Lipid panel and, TSH and hemoglobin A1c are pending.  6. EKG. Pending.  7. Substance abuse. The patient has a history of cocaine and cannabis abuse. She declines residential treatment.  8. Disposition. She will likely be discharged back with her boyfriend. She will follow up with [facility].   Orson Slick, MD 05/24/2017, 8:25 AM

## 2017-05-24 NOTE — Plan of Care (Signed)
Problem: Coping: Goal: Ability to cope will improve Outcome: Not Progressing Get irritable & outburst easily.

## 2017-05-24 NOTE — Progress Notes (Signed)
D: Pt has been very loud with several sudden outbursts and impulsive behaviors. Pt complained of severe anxiety, depression and SI; "I found out that my boyfriend-the guy that I thought will be my husband had been cheating on me; I really just feel like hurting myself right now." Pt contracts for safety. Pt denied pain, HI or AVH. A: medication offered as prescribed. All Pt questions and concerns addressed. Support, encouragement, and safe environment provided.  Pt's room reassessed for safety. 15-minute safety checks continue. R: Pt was med compliant.  Pt did not attend wrap-up group. Safety checks continue.

## 2017-05-25 MED ORDER — QUETIAPINE FUMARATE 200 MG PO TABS
600.0000 mg | ORAL_TABLET | Freq: Every day | ORAL | Status: DC
  Administered 2017-05-25 – 2017-05-26 (×2): 600 mg via ORAL
  Filled 2017-05-25 (×2): qty 3

## 2017-05-25 MED ORDER — GABAPENTIN 300 MG PO CAPS
600.0000 mg | ORAL_CAPSULE | Freq: Three times a day (TID) | ORAL | Status: DC
  Administered 2017-05-25 – 2017-05-27 (×7): 600 mg via ORAL
  Filled 2017-05-25 (×7): qty 2

## 2017-05-25 NOTE — Plan of Care (Signed)
Problem: Activity: Goal: Sleeping patterns will improve Outcome: Progressing Patient slept for Estimated Hours of 7.45; every 15 minutes safety round maintained, no injury or falls during this shift.    

## 2017-05-25 NOTE — [facility] Group Notes (Signed)
[place] Group Notes:  (Nursing/MHT/Case Management/Adjunct)  Date:  05/25/2017  Time:  5:08 AM  Type of Therapy:  Group Therapy  Participation Level:  Did Not Attend    Marylynn Pearson 05/25/2017, 5:08 AM

## 2017-05-25 NOTE — Plan of Care (Signed)
Problem: Coping: Goal: Ability to cope will improve Outcome: Not Progressing Patient unable to control outburst

## 2017-05-25 NOTE — Progress Notes (Signed)
Patient ID: Deborah Melendez, female   DOB: 06-02-1976, 41 y.o.   MRN: [id number] Irritable on approach in her room, "what the f--- do you want, is it time for snack yet, I need something to drink, go ahead and get me something to drink .." Patient came out of room and observed on the phone loud and crying; early medications given for mood and anxiety; she continued with vulgarity because she wanted snacks.

## 2017-05-25 NOTE — Progress Notes (Signed)
Recreation Therapy Notes  At approximately 11:05 am, LRT attempted assessment. Patient was sleeping and did not wake up when name was called.  Leon[facility], LRT/CTRS 05/25/2017 1:05 PM

## 2017-05-25 NOTE — Progress Notes (Signed)
[facility] MD Progress Note  05/25/2017 10:22 AM Deborah Melendez  MRN:  [id number]  Subjective:   05/24/2017. Deborah Melendez was unwilling to come to treatment team meeting today but agreed to meet with [place] in her room. She is heart broken and still suicidal and homicidal, tearful but pleasant. She is asking for more medicines. There are no somatic complaints. She is in bed all the time.  05/25/2017. Deborah Melendez complains of feeling "very very depressed suicidal and homicidal". She does not get out pf bed except for medication and food. She is easily agitated and rude. She is now asking for a referral to [facility] substance abuse treatment program. She was there earlier this yaer. I dio not know if she could be accepted again there. SW to investigate. She is asking for more medications.   Per nursing: Irritable on approach in her room, "what the f--- do you want, is it time for snack yet, I need something to drink, go ahead and get me something to drink .." Patient came out of room and observed on the phone loud and crying; early medications given for mood and anxiety; she continued with vulgarity because she wanted snacks.  Principal Problem: Major depressive disorder, recurrent severe without psychotic features ([place]) Diagnosis:   Patient Active Problem List   Diagnosis Date Noted  . Tobacco use disorder [F17.200] 05/22/2017  . Major depressive disorder, recurrent severe without psychotic features ([place]) [F33.2] 05/22/2017  . Homicidal ideations [R45.850]   . Suicidal ideations [R45.851]   . Agitation [R45.1]   . Adjustment disorder with depressed mood [F43.21] 02/10/2017  . Bipolar 1 disorder, depressed, severe ([place]) [F31.4] 02/04/2017  . Panic attacks [F41.0] 01/21/2017  . 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 Cannabis use disorder, severe, dependence ([place]) [F12.20] 01/20/2017  . Mood disorder ([place]) [F39] 01/19/2017   . MDD (major depressive disorder), recurrent severe, without psychosis ([place]) [F33.2] 08/08/2015  . Cocaine use disorder, severe, dependence ([place]) [F14.20] 08/08/2015  . Cigar smoker motivated to quit [F17.290] 07/06/2014   Total Time spent with patient: 30 minutes  Past Psychiatric History: depression, substance abuse.  Past Medical History:  Past Medical History:  Diagnosis Date  . MRSA (methicillin resistant Staphylococcus aureus)    surgery on finger 3 years ago  . Substance or medication-induced bipolar and related disorder with onset during intoxication ([place]) 09/08/2016    Past Surgical History:  Procedure Laterality Date  . FINGER SURGERY     Family History:  Family History  Problem Relation Age of Onset  . Diabetes Mother   . Hypertension Mother   . Drug abuse Father   . Schizophrenia Maternal Aunt    Family Psychiatric  History: See H&P. Social History:  History  Alcohol Use  . Yes    Comment: occasional      History  Drug Use  . Types: Methamphetamines, Cocaine    Comment: 30 days sober, just completed treatment at [facility] 8/11    Social History   Social History  . Marital status: Single    Spouse name: N/A  . Number of children: N/A  . Years of education: N/A   Social History Main Topics  . Smoking status: Current Every Day Smoker    Packs/day: 0.50    Types: Cigarettes  . Smokeless tobacco: Never Used     Comment: pt reported quiting three weeks ago  . Alcohol use Yes     Comment: occasional   .  Drug use: Yes    Types: Methamphetamines, Cocaine     Comment: 30 days sober, just completed treatment at [facility] 8/11  . Sexual activity: Yes    Birth control/ protection: None   Other Topics Concern  . None   Social History Narrative  . None   Additional Social History:                         Sleep: Fair  Appetite:  Fair  Current Medications: Current Facility-Administered Medications  Medication Dose Route Frequency Provider  Last Rate Last Dose  . acetaminophen (TYLENOL) tablet 650 mg  650 mg Oral Q6H PRN Myka Hitz B, MD      . alum & mag hydroxide-simeth (MAALOX/MYLANTA) 200-200-20 MG/5ML suspension 30 mL  30 mL Oral Q4H PRN Boone Gear B, MD      . divalproex (DEPAKOTE) DR tablet 500 mg  500 mg Oral Q8H Aliayah Tyer B, MD   500 mg at 05/25/17 1540  . gabapentin (NEURONTIN) capsule 600 mg  600 mg Oral TID Consuela Widener B, MD      . hydrOXYzine (ATARAX/VISTARIL) tablet 50 mg  50 mg Oral TID PRN Eldena Dede B, MD   50 mg at 05/23/17 2036  . magnesium hydroxide (MILK OF MAGNESIA) suspension 30 mL  30 mL Oral Daily PRN Tiye Huwe B, MD      . QUEtiapine (SEROQUEL) tablet 50 mg  50 mg Oral TID Karder Goodin B, MD   50 mg at 05/25/17 0831  . QUEtiapine (SEROQUEL) tablet 600 mg  600 mg Oral QHS Damira Kem B, MD        Lab Results: No results found for this or any previous visit (from the past 48 hour(s)).  Blood Alcohol level:  Lab Results  Component Value Date   ETH <5 05/21/2017   ETH <5 08/67/6195    Metabolic Disorder Labs: Lab Results  Component Value Date   HGBA1C 5.4 01/21/2017   MPG 108 01/21/2017   No results found for: PROLACTIN Lab Results  Component Value Date   CHOL 153 01/21/2017   TRIG 89 01/21/2017   HDL 58 01/21/2017   CHOLHDL 2.6 01/21/2017   VLDL 18 01/21/2017   LDLCALC 77 01/21/2017    Physical Findings: AIMS: Facial and Oral Movements Muscles of Facial Expression: None, normal Lips and Perioral Area: None, normal Jaw: None, normal Tongue: None, normal,Extremity Movements Upper (arms, wrists, hands, fingers): None, normal Lower (legs, knees, ankles, toes): None, normal, Trunk Movements Neck, shoulders, hips: None, normal, Overall Severity Severity of abnormal movements (highest score from questions above): None, normal Incapacitation due to abnormal movements: None, normal Patient's awareness of abnormal movements  (rate only patient's report): No Awareness, Dental Status Current problems with teeth and/or dentures?: No Does patient usually wear dentures?: No  CIWA:  CIWA-Ar Total: 3 COWS:  COWS Total Score: 6  Musculoskeletal: Strength & Muscle Tone: within normal limits Gait & Station: normal Patient leans: N/A  Psychiatric Specialty Exam: Physical Exam  Nursing note and vitals reviewed. Psychiatric: Her affect is blunt. Her speech is delayed. She is withdrawn. Thought content is paranoid. Cognition and memory are normal. She expresses impulsivity. She exhibits a depressed mood. She expresses homicidal and suicidal ideation.    Review of Systems  Psychiatric/Behavioral: Positive for depression, substance abuse and suicidal ideas.  All other systems reviewed and are negative.   Blood pressure 118/76, pulse 98, temperature 98.3 F (36.8 C), temperature source  Oral, resp. rate 16, height 5\' 2"  (1.575 m), weight 61.9 kg (136 lb 6.4 oz), last menstrual period 05/17/2017.Body mass index is 24.95 kg/m.  General Appearance: Disheveled  Eye Contact:  Minimal  Speech:  Slow  Volume:  Decreased  Mood:  Anxious, Depressed, Hopeless and Irritable  Affect:  Blunt  Thought Process:  Goal Directed and Descriptions of Associations: Intact  Orientation:  Full (Time, Place, and Person)  Thought Content:  WDL  Suicidal Thoughts:  Yes.  with intent/plan  Homicidal Thoughts:  Yes.  with intent/plan  Memory:  Immediate;   Fair Recent;   Fair Remote;   Fair  Judgement:  Poor  Insight:  Lacking  Psychomotor Activity:  Psychomotor Retardation  Concentration:  Concentration: Fair and Attention Span: Fair  Recall:  [employer] of Knowledge:  Fair  Language:  Fair  Akathisia:  No  Handed:  Right  AIMS (if indicated):     Assets:  Communication Skills Desire for Improvement Physical Health Resilience Social Support  ADL's:  Intact  Cognition:  WNL  Sleep:  Number of Hours: 7.45     Treatment Plan  Summary: Daily contact with patient to assess and evaluate symptoms and progress in treatment and Medication management   Deborah Melendez is a 41 year old female with history of depression, mood instability and substance admitted for suicidal and homicidal threats against her boyfriend in the context of relationship problems and medication noncompliance.  1. Suicidal or homicidal ideation. The patient is able to contract for safety in the hospital.   2. Mood. She was restarted on Depakote and Seroquel for depression and mood stabilization. We increased Seroquel to 600 mg.  3. Insomnia. Trazodone is available.  4. Anxiety. Vistaril and Neurontin are available. I will increase Neurontin to 600 mg tid.  5. Metabolic syndrome monitoring. Lipid panel and, TSH and hemoglobin A1c were done in January 2018.   6. EKG. Pending.  7. Substance abuse. The patient has a history of cocaine and cannabis abuse. She now requests referral to [facility].   8. Disposition. She will likely be discharged back with her boyfriend. She will follow up with [facility].   Orson Slick, MD 05/25/2017, 10:22 AM

## 2017-05-25 NOTE — Progress Notes (Signed)
Recreation Therapy Notes  Date: 05.29.18 Time: 9:30 am Location: Craft Room  Group Topic: Self-expression  Goal Area(s) Addresses:  Patient will identify one color per emotion listed on wheel. Patient will verbalize benefit of using art as a means of self-expression. Patient will verbalize one emotion experienced during session. Patient will be educated on other forms of self-expression.  Behavioral Response: Did not attend  Intervention: Emotion Wheel  Activity: Patients were given an Emotion Wheel worksheet and were instructed to pick a color for each emotion listed on the wheel.  Education: LRT educated patients on other forms of self-expression.   Education Outcome: Patient did not attend group.   Clinical Observations/Feedback: Patient did not attend group.  Shandelle Borrelli M, LRT/CTRS 05/25/2017 10:04 AM 

## 2017-05-25 NOTE — [facility] Group Notes (Signed)
[facility] LCSW Group Therapy Note  Date/Time: 05/25/17, 1500  Type of Therapy/Topic:  Group Therapy:  Feelings about Diagnosis  Participation Level:  Did Not Attend   Mood:   Description of Group:    This group will allow patients to explore their thoughts and feelings about diagnoses they have received. Patients will be guided to explore their level of understanding and acceptance of these diagnoses. Facilitator will encourage patients to process their thoughts and feelings about the reactions of others to their diagnosis, and will guide patients in identifying ways to discuss their diagnosis with significant others in their lives. This group will be process-oriented, with patients participating in exploration of their own experiences as well as giving and receiving support and challenge from other group members.   Therapeutic Goals: 1. Patient will demonstrate understanding of diagnosis as evidence by identifying two or more symptoms of the disorder:  2. Patient will be able to express two feelings regarding the diagnosis 3. Patient will demonstrate ability to communicate their needs through discussion and/or role plays  Summary of Patient Progress:        Therapeutic Modalities:   Cognitive Behavioral Therapy Brief Therapy Feelings Identification   Greg Elgin Carn, LCSW 

## 2017-05-25 NOTE — Progress Notes (Signed)
Pt irritable this am. Pts clothing placed in brown bag, unable to locate clothing.Patient yelling and cursing in hallways and at nurses station. Not easily redirectable. Denies SI, HI, AVH. Encouragement and support offered. Administration contacted about lost clothing. Medications given as prescribed. Encouraged patient to attend groups and verbalize feelings.  Pt continues to be irritable. Medication compliant. Does not attend group. Only comes out for meds and meals. Pt remains safe on unit with q 15 min checks.

## 2017-05-26 DIAGNOSIS — F1220 Cannabis dependence, uncomplicated: Secondary | ICD-10-CM

## 2017-05-26 DIAGNOSIS — F17200 Nicotine dependence, unspecified, uncomplicated: Secondary | ICD-10-CM

## 2017-05-26 DIAGNOSIS — F1420 Cocaine dependence, uncomplicated: Secondary | ICD-10-CM

## 2017-05-26 DIAGNOSIS — R45851 Suicidal ideations: Secondary | ICD-10-CM

## 2017-05-26 NOTE — Plan of Care (Signed)
Problem: Activity: Goal: Interest or engagement in leisure activities will improve Outcome: Progressing Patient did attempt to go to group outside

## 2017-05-26 NOTE — [facility] Group Notes (Signed)
[facility] Group Notes:  (Nursing/MHT/Case Management/Adjunct)  Date:  05/26/2017  Time:  12:22 AM  Type of Therapy:  Group Therapy  Participation Level:  Active  Participation Quality:  Wanting to eat snack she hurgy.  Affect:  Appropriate  Cognitive:  Alert  Insight:  Good  Engagement in Group:  Engaged  Modes of Intervention:  Support  Summary of Progress/Problems:  Deborah Melendez 05/26/2017, 12:22 AM

## 2017-05-26 NOTE — [facility] Group Notes (Signed)
[place] LCSW Group Therapy  05/26/2017 1:43 PM  Type of Therapy:  Group Therapy  Participation Level:  Patient did not attend group. CSW invited patient to group.   Summary of Progress/Problems: Emotional Regulation: Patients will identify both negative and positive emotions. They will discuss emotions they have difficulty regulating and how they impact their lives. Patients will be asked to identify healthy coping skills to combat unhealthy reactions to negative emotions.   Deborah Melendez [place]. [place], [place] 05/26/2017, 1:43 PM

## 2017-05-26 NOTE — Progress Notes (Signed)
Patient ID: Deborah Melendez, female   DOB: July 02, 1976, 42 y.o.   MRN: [id number] Loud, irritable, hostile, angry, disruptive, demanding, attention, snacks, food and medication seeking; limited interaction with peers; denied SI/HI/AVH.

## 2017-05-26 NOTE — Plan of Care (Signed)
Problem: Activity: Goal: Sleeping patterns will improve Outcome: Progressing Patient slept for Estimated Hours of 7.45; every 15 minutes safety round maintained, no injury or falls during this shift.    

## 2017-05-26 NOTE — Progress Notes (Signed)
[facility] MD Progress Note  05/26/2017 1:39 PM Deborah Melendez  MRN:  [id number]  Subjective:   05/24/2017. Ms. Deborah Melendez was unwilling to come to treatment team meeting today but agreed to meet with [place] in her room. She is heart broken and still suicidal and homicidal, tearful but pleasant. She is asking for more medicines. There are no somatic complaints. She is in bed all the time.  05/25/2017. Ms. Deborah Melendez complains of feeling "very very depressed suicidal and homicidal". She does not get out pf bed except for medication and food. She is easily agitated and rude. She is now asking for a referral to [facility] substance abuse treatment program. She was there earlier this yaer. I dio not know if she could be accepted again there. SW to investigate. She is asking for more medications.   05/26/2017. Ms. Deborah Melendez is still depressed and suicida and still wants to kill her boyfirend of 6 years. She is not asking for more medication today. She is focused on getting treatment for substance abuse. She completed rehab at [facility] in the fall of 2017. There are no side effects from medications. No program participation.  Per nursing: Loud, irritable, hostile, angry, disruptive, demanding, attention, snacks, food and medication seeking; limited interaction with peers; denied SI/HI/AVH.  Principal Problem: Major depressive disorder, recurrent severe without psychotic features ([place]) Diagnosis:   Patient Active Problem List   Diagnosis Date Noted  . Tobacco use disorder [F17.200] 05/22/2017  . Major depressive disorder, recurrent severe without psychotic features ([place]) [F33.2] 05/22/2017  . Homicidal ideations [R45.850]   . Suicidal ideations [R45.851]   . Agitation [R45.1]   . Adjustment disorder with depressed mood [F43.21] 02/10/2017  . Bipolar 1 disorder, depressed, severe ([place]) [F31.4] 02/04/2017  . Panic attacks [F41.0] 01/21/2017  . 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 Cannabis use disorder, severe, dependence ([place]) [F12.20] 01/20/2017  . Mood disorder ([place]) [F39] 01/19/2017  . MDD (major depressive disorder), recurrent severe, without psychosis ([place]) [F33.2] 08/08/2015  . Cocaine use disorder, severe, dependence ([place]) [F14.20] 08/08/2015  . Cigar smoker motivated to quit [F17.290] 07/06/2014   Total Time spent with patient: 30 minutes  Past Psychiatric History: depression, substance abuse.  Past Medical History:  Past Medical History:  Diagnosis Date  . MRSA (methicillin resistant Staphylococcus aureus)    surgery on finger 3 years ago  . Substance or medication-induced bipolar and related disorder with onset during intoxication ([place]) 09/08/2016    Past Surgical History:  Procedure Laterality Date  . FINGER SURGERY     Family History:  Family History  Problem Relation Age of Onset  . Diabetes Mother   . Hypertension Mother   . Drug abuse Father   . Schizophrenia Maternal Aunt    Family Psychiatric  History: See H&P. Social History:  History  Alcohol Use  . Yes    Comment: occasional      History  Drug Use  . Types: Methamphetamines, Cocaine    Comment: 30 days sober, just completed treatment at [facility] 8/11    Social History   Social History  . Marital status: Single    Spouse name: N/A  . Number of children: N/A  . Years of education: N/A   Social History Main Topics  . Smoking status: Current Every Day Smoker    Packs/day: 0.50    Types: Cigarettes  . Smokeless tobacco: Never Used     Comment: pt reported quiting three weeks ago  .  Alcohol use Yes     Comment: occasional   . Drug use: Yes    Types: Methamphetamines, Cocaine     Comment: 30 days sober, just completed treatment at [facility] 8/11  . Sexual activity: Yes    Birth control/ protection: None   Other Topics Concern  . None   Social History Narrative  . None   Additional Social History:                          Sleep: Fair  Appetite:  Fair  Current Medications: Current Facility-Administered Medications  Medication Dose Route Frequency Provider Last Rate Last Dose  . acetaminophen (TYLENOL) tablet 650 mg  650 mg Oral Q6H PRN Zebulin Siegel B, MD      . alum & mag hydroxide-simeth (MAALOX/MYLANTA) 200-200-20 MG/5ML suspension 30 mL  30 mL Oral Q4H PRN Bera Pinela B, MD      . divalproex (DEPAKOTE) DR tablet 500 mg  500 mg Oral Q8H Naoko Diperna B, MD   500 mg at 05/26/17 0640  . gabapentin (NEURONTIN) capsule 600 mg  600 mg Oral TID Javian Nudd B, MD   600 mg at 05/26/17 1208  . hydrOXYzine (ATARAX/VISTARIL) tablet 50 mg  50 mg Oral TID PRN Rutilio Yellowhair B, MD   50 mg at 05/25/17 1945  . magnesium hydroxide (MILK OF MAGNESIA) suspension 30 mL  30 mL Oral Daily PRN Madolin Twaddle B, MD      . QUEtiapine (SEROQUEL) tablet 50 mg  50 mg Oral TID Zeynep Fantroy B, MD   50 mg at 05/26/17 1208  . QUEtiapine (SEROQUEL) tablet 600 mg  600 mg Oral QHS Liesel Peckenpaugh B, MD   600 mg at 05/25/17 2147    Lab Results: No results found for this or any previous visit (from the past 48 hour(s)).  Blood Alcohol level:  Lab Results  Component Value Date   ETH <5 05/21/2017   ETH <5 01/05/3234    Metabolic Disorder Labs: Lab Results  Component Value Date   HGBA1C 5.4 01/21/2017   MPG 108 01/21/2017   No results found for: PROLACTIN Lab Results  Component Value Date   CHOL 153 01/21/2017   TRIG 89 01/21/2017   HDL 58 01/21/2017   CHOLHDL 2.6 01/21/2017   VLDL 18 01/21/2017   LDLCALC 77 01/21/2017    Physical Findings: AIMS: Facial and Oral Movements Muscles of Facial Expression: None, normal Lips and Perioral Area: None, normal Jaw: None, normal Tongue: None, normal,Extremity Movements Upper (arms, wrists, hands, fingers): None, normal Lower (legs, knees, ankles, toes): None, normal, Trunk Movements Neck, shoulders, hips: None,  normal, Overall Severity Severity of abnormal movements (highest score from questions above): None, normal Incapacitation due to abnormal movements: None, normal Patient's awareness of abnormal movements (rate only patient's report): No Awareness, Dental Status Current problems with teeth and/or dentures?: No Does patient usually wear dentures?: No  CIWA:  CIWA-Ar Total: 3 COWS:  COWS Total Score: 6  Musculoskeletal: Strength & Muscle Tone: within normal limits Gait & Station: normal Patient leans: N/A  Psychiatric Specialty Exam: Physical Exam  Nursing note and vitals reviewed. Psychiatric: Her affect is blunt. Her speech is delayed. She is withdrawn. Thought content is paranoid. Cognition and memory are normal. She expresses impulsivity. She exhibits a depressed mood. She expresses homicidal and suicidal ideation.    Review of Systems  Psychiatric/Behavioral: Positive for depression, substance abuse and suicidal ideas.  All other systems  reviewed and are negative.   Blood pressure 107/76, pulse 90, temperature 98 F (36.7 C), temperature source Oral, resp. rate 16, height 5\' 2"  (1.575 m), weight 61.9 kg (136 lb 6.4 oz), last menstrual period 05/17/2017, SpO2 100 %.Body mass index is 24.95 kg/m.  General Appearance: Disheveled  Eye Contact:  Minimal  Speech:  Slow  Volume:  Decreased  Mood:  Anxious, Depressed, Hopeless and Irritable  Affect:  Blunt  Thought Process:  Goal Directed and Descriptions of Associations: Intact  Orientation:  Full (Time, Place, and Person)  Thought Content:  WDL  Suicidal Thoughts:  Yes.  with intent/plan  Homicidal Thoughts:  Yes.  with intent/plan  Memory:  Immediate;   Fair Recent;   Fair Remote;   Fair  Judgement:  Poor  Insight:  Lacking  Psychomotor Activity:  Psychomotor Retardation  Concentration:  Concentration: Fair and Attention Span: Fair  Recall:  [employer] of Knowledge:  Fair  Language:  Fair  Akathisia:  No  Handed:  Right   AIMS (if indicated):     Assets:  Communication Skills Desire for Improvement Physical Health Resilience Social Support  ADL's:  Intact  Cognition:  WNL  Sleep:  Number of Hours: 7.45     Treatment Plan Summary: Daily contact with patient to assess and evaluate symptoms and progress in treatment and Medication management   Ms. Deborah Melendez is a 41 year old female with history of depression, mood instability and substance admitted for suicidal and homicidal threats against her boyfriend in the context of relationship problems and medication noncompliance.  1. Suicidal or homicidal ideation. The patient is able to contract for safety in the hospital.   2. Mood. She was restarted on Depakote and Seroquel for depression and mood stabilization. We increased Seroquel to 600 mg.  3. Insomnia. Trazodone is available.  4. Anxiety. Vistaril and Neurontin are available. I will increase Neurontin to 600 mg tid.  5. Metabolic syndrome monitoring. Lipid panel and, TSH and hemoglobin A1c were done in January 2018.   6. EKG. Pending.  7. Substance abuse. The patient has a history of cocaine and cannabis abuse. She now requests referral to [facility].   8. Disposition. She will likely be discharged back with her boyfriend. She will follow up with [facility].   Orson Slick, MD 05/26/2017, 1:39 PM

## 2017-05-27 LAB — HCG, QUANTITATIVE, PREGNANCY: hCG, Beta Chain, Quant, S: <1 m[IU]/mL (ref <5)

## 2017-05-27 LAB — VALPROIC ACID LEVEL: VALPROIC ACID LVL: 84 ug/mL (ref 50.0–100.0)

## 2017-05-27 MED ORDER — GABAPENTIN 600 MG PO TABS
600.0000 mg | ORAL_TABLET | Freq: Three times a day (TID) | ORAL | Status: DC
  Administered 2017-05-27 – 2017-05-31 (×12): 600 mg via ORAL
  Filled 2017-05-27 (×12): qty 1

## 2017-05-27 MED ORDER — DIVALPROEX SODIUM 500 MG PO DR TAB
750.0000 mg | DELAYED_RELEASE_TABLET | Freq: Two times a day (BID) | ORAL | Status: DC
  Administered 2017-05-27 – 2017-05-31 (×8): 750 mg via ORAL
  Filled 2017-05-27 (×8): qty 1

## 2017-05-27 MED ORDER — QUETIAPINE FUMARATE 200 MG PO TABS
600.0000 mg | ORAL_TABLET | Freq: Every day | ORAL | Status: DC
  Administered 2017-05-27 – 2017-05-30 (×4): 600 mg via ORAL
  Filled 2017-05-27 (×4): qty 3

## 2017-05-27 MED ORDER — QUETIAPINE FUMARATE 25 MG PO TABS
50.0000 mg | ORAL_TABLET | Freq: Three times a day (TID) | ORAL | Status: DC
  Administered 2017-05-28 – 2017-05-31 (×11): 50 mg via ORAL
  Filled 2017-05-27 (×11): qty 2

## 2017-05-27 NOTE — Plan of Care (Signed)
Problem: Activity: Goal: Sleeping patterns will improve Outcome: Not Progressing Patient sleeping most of day. Getting up form meals, meds and phone.

## 2017-05-27 NOTE — Progress Notes (Signed)
D: Pt denies SI/HI/AVH. Pt is angry, irritable aggressive and hostile towards staff using profanities and very intrusive. Pt is not interacting with peers and staff appropriately.  A: Pt was offered support and encouragement. Pt was given scheduled medications. Pt was encouraged to attend groups. Q 15 minute checks were done for safety.  R:Pt attends groups and does not interact well with peers and staff. Pt is complaint with medication. Pt  Is not receptive to treatment. safety maintained on unit.

## 2017-05-27 NOTE — Progress Notes (Signed)
Contacted by RN to speak with patient about age limits on visiting policy. Wanted her 40 year old child to visit. Explained could not bring someone under 5 years old to visit. Patient stated "speaking to you was a big waste of time"

## 2017-05-27 NOTE — [facility] Group Notes (Signed)
[facility] LCSW Group Therapy  05/27/2017 2:39 PM  Type of Therapy:  Group Therapy  Participation Level:  Patient did not attend group. CSW invited patient to group.   Summary of Progress/Problems: Balance in life: Patients will discuss the concept of balance and how it looks and feels to be unbalanced. Pt will identify areas in their life that is unbalanced and ways to become more balanced. They discussed what aspects in their lives has influenced their self care. Patients also discussed self care in the areas of self regulation/control, hygiene/appearance, sleep/relaxation, healthy leisure, healthy eating habits, exercise, inner peace/spirituality, self improvement, sobriety, and health management. They were challenged to identify changes that are needed in order to improve self care.   Chapel Silverthorn G. Avery, [place] 05/27/2017, 2:42 PM

## 2017-05-27 NOTE — Progress Notes (Signed)
D: Pt denies SI/HI/AVH. Pt was irritable at beginning of shift but after she had a visit from her husband her mood lightened. . Pt is not interacting with peers.  A: Pt was offered support and encouragement. Pt was given scheduled medications. Pt was encouraged to attend groups. Q 15 minute checks were done for safety.  R:Pt attends groups and does not interact well with peers and staff. Pt is complaint with medication. Pt  Is not receptive to treatment. safety maintained on unit.

## 2017-05-27 NOTE — Progress Notes (Signed)
[facility] MD Progress Note  05/27/2017 11:45 AM Deborah Melendez  MRN:  [id number]  Subjective:   05/24/2017. Deborah Melendez was unwilling to come to treatment team meeting today but agreed to meet with [place] in her room. She is heart broken and still suicidal and homicidal, tearful but pleasant. She is asking for more medicines. There are no somatic complaints. She is in bed all the time.  05/25/2017. Deborah Melendez complains of feeling "very very depressed suicidal and homicidal". She does not get out pf bed except for medication and food. She is easily agitated and rude. She is now asking for a referral to [facility] substance abuse treatment program. She was there earlier this yaer. I dio not know if she could be accepted again there. SW to investigate. She is asking for more medications.   05/26/2017. Deborah Melendez is still depressed and suicida and still wants to kill her boyfirend of 6 years. She is not asking for more medication today. She is focused on getting treatment for substance abuse. She completed rehab at [facility] in the fall of 2017. There are no side effects from medications. No program participation.  05/27/2017. Deborah Melendez insists that she is still homicidal and unable to return home. Today she declined at that at the rehabilitation facility at [facility]. She will be discharged to home on Monday. We will make no more efforts to find her rehabilitation bed. She complains that her mood is very unstable, that she is easily irritated and angry. She asks for more medications.  Per nursing: D: Pt denies SI/HI/AVH. Pt is angry, irritable aggressive and hostile towards staff using profanities and very intrusive. Pt is not interacting with peers and staff appropriately.  A: Pt was offered support and encouragement. Pt was given scheduled medications. Pt was encouraged to attend groups. Q 15 minute checks were done for safety.  R:Pt attends groups and does not interact well with peers and staff. Pt is complaint  with medication. Pt  Is not receptive to treatment. safety maintained on unit.  Principal Problem: Major depressive disorder, recurrent severe without psychotic features ([place]) Diagnosis:   Patient Active Problem List   Diagnosis Date Noted  . Tobacco use disorder [F17.200] 05/22/2017  . Major depressive disorder, recurrent severe without psychotic features ([place]) [F33.2] 05/22/2017  . Homicidal ideations [R45.850]   . Suicidal ideations [R45.851]   . Agitation [R45.1]   . Adjustment disorder with depressed mood [F43.21] 02/10/2017  . Bipolar 1 disorder, depressed, severe ([place]) [F31.4] 02/04/2017  . Panic attacks [F41.0] 01/21/2017  . 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 Cannabis use disorder, severe, dependence ([place]) [F12.20] 01/20/2017  . Mood disorder ([place]) [F39] 01/19/2017  . MDD (major depressive disorder), recurrent severe, without psychosis ([place]) [F33.2] 08/08/2015  . Cocaine use disorder, severe, dependence ([place]) [F14.20] 08/08/2015  . Cigar smoker motivated to quit [F17.290] 07/06/2014   Total Time spent with patient: 30 minutes  Past Psychiatric History: depression, substance abuse.  Past Medical History:  Past Medical History:  Diagnosis Date  . MRSA (methicillin resistant Staphylococcus aureus)    surgery on finger 3 years ago  . Substance or medication-induced bipolar and related disorder with onset during intoxication ([place]) 09/08/2016    Past Surgical History:  Procedure Laterality Date  . FINGER SURGERY     Family History:  Family History  Problem Relation Age of Onset  . Diabetes Mother   . Hypertension Mother   . Drug abuse Father   .  Schizophrenia Maternal Aunt    Family Psychiatric  History: See H&P. Social History:  History  Alcohol Use  . Yes    Comment: occasional      History  Drug Use  . Types: Methamphetamines, Cocaine    Comment: 30 days sober, just  completed treatment at [facility] 8/11    Social History   Social History  . Marital status: Single    Spouse name: N/A  . Number of children: N/A  . Years of education: N/A   Social History Main Topics  . Smoking status: Current Every Day Smoker    Packs/day: 0.50    Types: Cigarettes  . Smokeless tobacco: Never Used     Comment: pt reported quiting three weeks ago  . Alcohol use Yes     Comment: occasional   . Drug use: Yes    Types: Methamphetamines, Cocaine     Comment: 30 days sober, just completed treatment at [facility] 8/11  . Sexual activity: Yes    Birth control/ protection: None   Other Topics Concern  . None   Social History Narrative  . None   Additional Social History:                         Sleep: Fair  Appetite:  Fair  Current Medications: Current Facility-Administered Medications  Medication Dose Route Frequency Provider Last Rate Last Dose  . acetaminophen (TYLENOL) tablet 650 mg  650 mg Oral Q6H PRN Faris Coolman B, MD      . alum & mag hydroxide-simeth (MAALOX/MYLANTA) 200-200-20 MG/5ML suspension 30 mL  30 mL Oral Q4H PRN Loui Massenburg B, MD      . divalproex (DEPAKOTE) DR tablet 500 mg  500 mg Oral Q8H Jospeh Mangel B, MD   500 mg at 05/26/17 2201  . gabapentin (NEURONTIN) capsule 600 mg  600 mg Oral TID Thaila Bottoms B, MD   600 mg at 05/27/17 0747  . hydrOXYzine (ATARAX/VISTARIL) tablet 50 mg  50 mg Oral TID PRN Talya Quain B, MD   50 mg at 05/26/17 2201  . magnesium hydroxide (MILK OF MAGNESIA) suspension 30 mL  30 mL Oral Daily PRN Jireh Vinas B, MD      . QUEtiapine (SEROQUEL) tablet 50 mg  50 mg Oral TID Keira Bohlin B, MD   50 mg at 05/27/17 0747  . QUEtiapine (SEROQUEL) tablet 600 mg  600 mg Oral QHS Semaj Kham B, MD   600 mg at 05/26/17 2200    Lab Results: No results found for this or any previous visit (from the past 48 hour(s)).  Blood Alcohol level:  Lab Results   Component Value Date   ETH <5 05/21/2017   ETH <5 64/33/2951    Metabolic Disorder Labs: Lab Results  Component Value Date   HGBA1C 5.4 01/21/2017   MPG 108 01/21/2017   No results found for: PROLACTIN Lab Results  Component Value Date   CHOL 153 01/21/2017   TRIG 89 01/21/2017   HDL 58 01/21/2017   CHOLHDL 2.6 01/21/2017   VLDL 18 01/21/2017   LDLCALC 77 01/21/2017    Physical Findings: AIMS: Facial and Oral Movements Muscles of Facial Expression: None, normal Lips and Perioral Area: None, normal Jaw: None, normal Tongue: None, normal,Extremity Movements Upper (arms, wrists, hands, fingers): None, normal Lower (legs, knees, ankles, toes): None, normal, Trunk Movements Neck, shoulders, hips: None, normal, Overall Severity Severity of abnormal movements (highest score from questions above): None,  normal Incapacitation due to abnormal movements: None, normal Patient's awareness of abnormal movements (rate only patient's report): No Awareness, Dental Status Current problems with teeth and/or dentures?: No Does patient usually wear dentures?: No  CIWA:  CIWA-Ar Total: 3 COWS:  COWS Total Score: 6  Musculoskeletal: Strength & Muscle Tone: within normal limits Gait & Station: normal Patient leans: N/A  Psychiatric Specialty Exam: Physical Exam  Nursing note and vitals reviewed. Psychiatric: Her affect is blunt. Her speech is delayed. She is withdrawn. Thought content is paranoid. Cognition and memory are normal. She expresses impulsivity. She exhibits a depressed mood. She expresses homicidal and suicidal ideation.    Review of Systems  Psychiatric/Behavioral: Positive for depression, substance abuse and suicidal ideas.  All other systems reviewed and are negative.   Blood pressure 135/90, pulse 92, temperature 97.7 F (36.5 C), temperature source Oral, resp. rate 18, height 5\' 2"  (1.575 m), weight 61.9 kg (136 lb 6.4 oz), last menstrual period 05/17/2017, SpO2 100  %.Body mass index is 24.95 kg/m.  General Appearance: Disheveled  Eye Contact:  Minimal  Speech:  Slow  Volume:  Decreased  Mood:  Anxious, Depressed, Hopeless and Irritable  Affect:  Blunt  Thought Process:  Goal Directed and Descriptions of Associations: Intact  Orientation:  Full (Time, Place, and Person)  Thought Content:  WDL  Suicidal Thoughts:  Yes.  with intent/plan  Homicidal Thoughts:  Yes.  with intent/plan  Memory:  Immediate;   Fair Recent;   Fair Remote;   Fair  Judgement:  Poor  Insight:  Lacking  Psychomotor Activity:  Psychomotor Retardation  Concentration:  Concentration: Fair and Attention Span: Fair  Recall:  [employer] of Knowledge:  Fair  Language:  Fair  Akathisia:  No  Handed:  Right  AIMS (if indicated):     Assets:  Communication Skills Desire for Improvement Physical Health Resilience Social Support  ADL's:  Intact  Cognition:  WNL  Sleep:  Number of Hours: 6.75     Treatment Plan Summary: Daily contact with patient to assess and evaluate symptoms and progress in treatment and Medication management   Ms. Beyersdorf is a 41 year old female with history of depression, mood instability and substance admitted for suicidal and homicidal threats against her boyfriend in the context of relationship problems and medication noncompliance.  1. Suicidal or homicidal ideation. The patient is able to contract for safety in the hospital.   2. Mood. She was restarted on Depakote and Seroquel for depression and mood stabilization. We increased Seroquel to 600 mg.  3. Insomnia. Trazodone is available.  4. Anxiety. Vistaril and Neurontin are available. I will increase Neurontin to 600 mg tid.  5. Metabolic syndrome monitoring. Lipid panel and, TSH and hemoglobin A1c were done in January 2018.   6. EKG. Pending.  7. Substance abuse. The patient has a history of cocaine and cannabis abuse. She now requests referral to [facility].   8. Disposition. She  will likely be discharged back with her boyfriend. She will follow up with [facility].   Orson Slick, MD 05/27/2017, 11:45 AM

## 2017-05-27 NOTE — Plan of Care (Signed)
Problem: Safety: Goal: Ability to remain free from injury will improve Outcome: Progressing Pt has not displayed any self injury while on the unit. Pt remains safe.

## 2017-05-27 NOTE — B[facility] Group Notes (Signed)
[facility] Group Notes:  (Nursing/MHT/Case Management/Adjunct)  Date:  05/27/2017  Time:  10:37 PM  Type of Therapy:  Group Therapy  Participation Level:  Active  Participation Quality:  Appropriate  Affect:  Appropriate  Cognitive:  Appropriate  Insight:  Appropriate  Engagement in Group:  Engaged  Modes of Intervention:  Discussion  Summary of Progress/Problems:  Deborah Melendez 05/27/2017, 10:37 PM

## 2017-05-27 NOTE — Progress Notes (Signed)
Pt denies being suicidal during am assessment. Yelling requesting morning and lunch time meds. Pt cursing at staff. Does not attend group. Only comes out for meals and meds. Encouragement and support offered. Safety checks maintained. Pt receptive and remains safe on unit with q 15 min checks.

## 2017-05-27 NOTE — BHH Group Notes (Signed)
[facility] Group Notes:  (Nursing/MHT/Case Management/Adjunct)  Date:  05/27/2017  Time:  3:54 AM  Type of Therapy:  Psychoeducational Skills  Participation Level:  None  Participation Quality:  Intrusive, Inattentive, Monopolizing and Resistant  Affect:  Labile and Resistant  Cognitive:  Disorganized and Lacking  Insight:  None  Engagement in Group:  Distracting and Poor  Modes of Intervention:  Discussion and Exploration  Summary of Progress/Problems:  Kathi Ludwig 05/27/2017, 3:54 AM

## 2017-05-27 NOTE — B[facility] Group Notes (Signed)
[place] LCSW Group Therapy Note  Type of Therapy and Topic:  Group Therapy:  Goals Group: SMART Goals  Participation Level:  Patient did not attend group. CSW invited patient to group.   Description of Group:   The purpose of a daily goals group is to assist and guide patients in setting recovery/wellness-related goals.  The objective is to set goals as they relate to the crisis in which they were admitted. Patients will be using SMART goal modalities to set measurable goals.  Characteristics of realistic goals will be discussed and patients will be assisted in setting and processing how one will reach their goal. Facilitator will also assist patients in applying interventions and coping skills learned in psycho-education groups to the SMART goal and process how one will achieve defined goal.  Therapeutic Goals: -Patients will develop and document one goal related to or their crisis in which brought them into treatment. -Patients will be guided by LCSW using SMART goal setting modality in how to set a measurable, attainable, realistic and time sensitive goal.  -Patients will process barriers in reaching goal. -Patients will process interventions in how to overcome and successful in reaching goal.   Summary of Patient Progress:  Patient Goal: Patient did not attend group. CSW invited patient to group.    Therapeutic Modalities:   Motivational Interviewing Public relations account executive Therapy Crisis Intervention Model SMART goals setting  Gudelia Eugene G. Claybon Jabs MSW, [facility] 05/27/2017 10:23 AM

## 2017-05-28 LAB — CHLAMYDIA/NGC RT PCR (ARMC ONLY)
CHLAMYDIA TR: NOT DETECTED
N gonorrhoeae: NOT DETECTED

## 2017-05-28 MED ORDER — GABAPENTIN 600 MG PO TABS: 600.0000 mg | ORAL_TABLET | Freq: Three times a day (TID) | ORAL | 1 refills | Status: DC

## 2017-05-28 MED ORDER — QUETIAPINE FUMARATE 300 MG PO TABS: 600.0000 mg | ORAL_TABLET | Freq: Every day | ORAL | 1 refills | Status: DC

## 2017-05-28 MED ORDER — HYDROXYZINE HCL 50 MG PO TABS: 50.0000 mg | ORAL_TABLET | Freq: Three times a day (TID) | ORAL | 1 refills | Status: DC | PRN

## 2017-05-28 MED ORDER — QUETIAPINE FUMARATE 50 MG PO TABS: 50.0000 mg | ORAL_TABLET | Freq: Three times a day (TID) | ORAL | 1 refills | Status: DC

## 2017-05-28 MED ORDER — DIVALPROEX SODIUM 250 MG PO DR TAB: 750.0000 mg | DELAYED_RELEASE_TABLET | Freq: Two times a day (BID) | ORAL | 1 refills | Status: DC

## 2017-05-28 NOTE — BHH Suicide Risk Assessment (Signed)
[place] INPATIENT:  Family/Significant Other Suicide Prevention Education  Suicide Prevention Education:  Patient Refusal for Family/Significant Other Suicide Prevention Education: The patient Deborah Melendez has refused to provide written consent for family/significant other to be provided Family/Significant Other Suicide Prevention Education during admission and/or prior to discharge.  Physician notified.  Emilie Rutter, MSW, LCSW-A 05/28/2017, 3:09 PM

## 2017-05-28 NOTE — Progress Notes (Signed)
Recreation Therapy Notes  Date: 06.01.18 Time: 9:30 am Location: Craft Room  Group Topic: Coping Skills  Goal Area(s) Addresses:  Patient will participate in healthy coping skill. Patient will verbalize benefit of using art as a coping skill.  Behavioral Response: Did not attend  Intervention: Coloring  Activity: Patients were given coloring sheets to color and were instructed to think about what emotions they were feeling as well as what their minds were focused on.  Education: LRT educated patients on healthy coping skills.  Education Outcome: Patient did not attend group.  Clinical Observations/Feedback: Patient did not attend group.  Leonette [facility], LRT/CTRS 05/28/2017 10:31 AM

## 2017-05-28 NOTE — B[facility] Group Notes (Signed)
[facility] LCSW Group Therapy  05/28/2017 2:30 PM  Type of Therapy:  Group Therapy  Participation Level:  Patient did not attend group. CSW invited patient to group.   Summary of Progress/Problems: Safety Planning: Patients identified fears or worries surrounding discharge. Patients offered support to their peers and openly developed safety plans for their individual needs. Patients developed their own safety plan. Patients discussed their warning signs, coping strategies, support system with family and friends, identified mental health professionals, and how to keep their environments safe (ex. Removing unnecessary medications or removing weapons/guns). Patients then discussed their personalized safety plan with the group.   Deborah Melendez G. [place], [place] 05/28/2017, 2:30 PM

## 2017-05-28 NOTE — Progress Notes (Addendum)
[facility] MD Progress Note  05/28/2017 1:10 PM Deborah Melendez  MRN:  [id number]  Subjective:  Deborah Melendez is a 41 year old female with a history of schizoaffective disorder admitted for homicidal ideation towards her boyfriend in the context of medication noncompliance, infidelity and substance abuse. She initially requested rehab but changed her mind. She is on multiple medications. Still homicidal. Anticipated discharge on Monday.  05/24/2017. Deborah Melendez was unwilling to come to treatment team meeting today but agreed to meet with [place] in her room. She is heart broken and still suicidal and homicidal, tearful but pleasant. She is asking for more medicines. There are no somatic complaints. She is in bed all the time.  05/25/2017. Deborah Melendez complains of feeling "very very depressed suicidal and homicidal". She does not get out pf bed except for medication and food. She is easily agitated and rude. She is now asking for a referral to [facility] substance abuse treatment program. She was there earlier this yaer. I dio not know if she could be accepted again there. SW to investigate. She is asking for more medications.   05/26/2017. Deborah Melendez is still depressed and suicida and still wants to kill her boyfirend of 6 years. She is not asking for more medication today. She is focused on getting treatment for substance abuse. She completed rehab at [facility] in the fall of 2017. There are no side effects from medications. No program participation.  05/27/2017. Deborah Melendez insists that she is still homicidal and unable to return home. Today she declined at that at the rehabilitation facility at [facility]. She will be discharged to home on Monday. We will make no more efforts to find her rehabilitation bed. She complains that her mood is very unstable, that she is easily irritated and angry. She asks for more medications.  05/28/2017. Deborah Melendez still homicidal and complaining of nightmares and hallucinations. She is still  irritable. She is on multiple medications but reports no side effects. She decided against [facility] rehab. No group participation. She will be discharged to home on Monday.   Per nursing: Patient was very agitated and irritable early this morning but her mood was enlightened later in the morning. This Probation officer sat  and spoke with the patient during breakfast and patient informed this Probation officer that she was worried about her husband being deported back to [place]. Patient states, "I don't know what I'll do if he gets deported again, the last time he was gone for three months. When they came and knocked on our door last week and they told him that he was going to be deported, I just wanted to kill myself. I can't deal with this, that's why I came here and I don't want to leave. Denies HI/SI at this time but states, "No not now but next week when he goes to immigration court and I find out that my man is leaving me I don't know what I will do." Patient takes medication as prescribed. Did not attend the morning therapy class pt states...."I don't want to go in there sitting around all those people right now but I do want to go outside this afternoon when they go out, I hadn't been outside in a minute." Milieu remains calm at this time, safety checks performed q 15 minutes by nursing staff. Will continue to monitor.   Principal Problem: Major depressive disorder, recurrent severe without psychotic features ([place]) Diagnosis:   Patient Active Problem List   Diagnosis Date Noted  . Tobacco use disorder [F17.200]  05/22/2017  . Major depressive disorder, recurrent severe without psychotic features ([place]) [F33.2] 05/22/2017  . Homicidal ideations [R45.850]   . Suicidal ideations [R45.851]   . Agitation [R45.1]   . Adjustment disorder with depressed mood [F43.21] 02/10/2017  . Bipolar 1 disorder, depressed, severe ([place]) [F31.4] 02/04/2017  . Panic attacks [F41.0] 01/21/2017  . 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 Cannabis use disorder, severe, dependence ([place]) [F12.20] 01/20/2017  . Mood disorder ([place]) [F39] 01/19/2017  . MDD (major depressive disorder), recurrent severe, without psychosis ([place]) [F33.2] 08/08/2015  . Cocaine use disorder, severe, dependence ([place]) [F14.20] 08/08/2015  . Cigar smoker motivated to quit [F17.290] 07/06/2014   Total Time spent with patient: 30 minutes  Past Psychiatric History: depression, substance abuse.  Past Medical History:  Past Medical History:  Diagnosis Date  . MRSA (methicillin resistant Staphylococcus aureus)    surgery on finger 3 years ago  . Substance or medication-induced bipolar and related disorder with onset during intoxication ([place]) 09/08/2016    Past Surgical History:  Procedure Laterality Date  . FINGER SURGERY     Family History:  Family History  Problem Relation Age of Onset  . Diabetes Mother   . Hypertension Mother   . Drug abuse Father   . Schizophrenia Maternal Aunt    Family Psychiatric  History: See H&P. Social History:  History  Alcohol Use  . Yes    Comment: occasional      History  Drug Use  . Types: Methamphetamines, Cocaine    Comment: 30 days sober, just completed treatment at [facility] 8/11    Social History   Social History  . Marital status: Single    Spouse name: N/A  . Number of children: N/A  . Years of education: N/A   Social History Main Topics  . Smoking status: Current Every Day Smoker    Packs/day: 0.50    Types: Cigarettes  . Smokeless tobacco: Never Used     Comment: pt reported quiting three weeks ago  . Alcohol use Yes     Comment: occasional   . Drug use: Yes    Types: Methamphetamines, Cocaine     Comment: 30 days sober, just completed treatment at [facility] 8/11  . Sexual activity: Yes    Birth control/ protection: None   Other Topics Concern  . None   Social History Narrative  . None    Additional Social History:                         Sleep: Fair  Appetite:  Fair  Current Medications: Current Facility-Administered Medications  Medication Dose Route Frequency Provider Last Rate Last Dose  . acetaminophen (TYLENOL) tablet 650 mg  650 mg Oral Q6H PRN Oliviagrace Crisanti B, MD      . alum & mag hydroxide-simeth (MAALOX/MYLANTA) 200-200-20 MG/5ML suspension 30 mL  30 mL Oral Q4H PRN Rawley Harju B, MD      . divalproex (DEPAKOTE) DR tablet 750 mg  750 mg Oral Q12H Claudetta Sallie B, MD   750 mg at 05/28/17 0747  . gabapentin (NEURONTIN) tablet 600 mg  600 mg Oral TID Tahni Porchia B, MD   600 mg at 05/28/17 1204  . hydrOXYzine (ATARAX/VISTARIL) tablet 50 mg  50 mg Oral TID PRN Shonteria Abeln B, MD   50 mg at 05/27/17 1508  . magnesium hydroxide (MILK OF MAGNESIA) suspension 30 mL  30  mL Oral Daily PRN Lucianne Smestad B, MD      . QUEtiapine (SEROQUEL) tablet 50 mg  50 mg Oral TID Cleta Heatley B, MD   50 mg at 05/28/17 1204  . QUEtiapine (SEROQUEL) tablet 600 mg  600 mg Oral QHS Kearstyn Avitia B, MD   600 mg at 05/27/17 2141    Lab Results:  Results for orders placed or performed during the hospital encounter of 05/22/17 (from the past 48 hour(s))  hCG, quantitative, pregnancy     Status: None   Collection Time: 05/27/17  4:27 PM  Result Value Ref Range   hCG, Beta Chain, Quant, S <1 <5 mIU/mL    Comment:          GEST. AGE      CONC.  (mIU/mL)   <=1 WEEK        5 - 50     2 WEEKS       50 - 500     3 WEEKS       100 - 10,000     4 WEEKS     1,000 - 30,000     5 WEEKS     3,500 - 115,000   6-8 WEEKS     12,000 - 270,000    12 WEEKS     15,000 - 220,000        FEMALE AND NON-PREGNANT FEMALE:     LESS THAN 5 mIU/mL   Valproic acid level     Status: None   Collection Time: 05/27/17  4:27 PM  Result Value Ref Range   Valproic Acid Lvl 84 50.0 - 100.0 ug/mL  Chlamydia/NGC rt PCR ([facility] only)     Status: None    Collection Time: 05/28/17  8:29 AM  Result Value Ref Range   Specimen source GC/Chlam URINE, RANDOM    Chlamydia Tr NOT DETECTED NOT DETECTED   N gonorrhoeae NOT DETECTED NOT DETECTED    Comment: (NOTE) 100  This methodology has not been evaluated in pregnant women or in 200  patients with a history of hysterectomy. 300 400  This methodology will not be performed on patients less than 2  years of age.     Blood Alcohol level:  Lab Results  Component Value Date   ETH <5 05/21/2017   ETH <5 15/17/6160    Metabolic Disorder Labs: Lab Results  Component Value Date   HGBA1C 5.4 01/21/2017   MPG 108 01/21/2017   No results found for: PROLACTIN Lab Results  Component Value Date   CHOL 153 01/21/2017   TRIG 89 01/21/2017   HDL 58 01/21/2017   CHOLHDL 2.6 01/21/2017   VLDL 18 01/21/2017   LDLCALC 77 01/21/2017    Physical Findings: AIMS: Facial and Oral Movements Muscles of Facial Expression: None, normal Lips and Perioral Area: None, normal Jaw: None, normal Tongue: None, normal,Extremity Movements Upper (arms, wrists, hands, fingers): None, normal Lower (legs, knees, ankles, toes): None, normal, Trunk Movements Neck, shoulders, hips: None, normal, Overall Severity Severity of abnormal movements (highest score from questions above): None, normal Incapacitation due to abnormal movements: None, normal Patient's awareness of abnormal movements (rate only patient's report): No Awareness, Dental Status Current problems with teeth and/or dentures?: No Does patient usually wear dentures?: No  CIWA:  CIWA-Ar Total: 3 COWS:  COWS Total Score: 6  Musculoskeletal: Strength & Muscle Tone: within normal limits Gait & Station: normal Patient leans: N/A  Psychiatric Specialty Exam: Physical Exam  Nursing note and  vitals reviewed. Psychiatric: Her affect is blunt. Her speech is delayed. She is withdrawn. Thought content is paranoid. Cognition and memory are normal. She  expresses impulsivity. She exhibits a depressed mood. She expresses homicidal and suicidal ideation.    Review of Systems  Psychiatric/Behavioral: Positive for depression, substance abuse and suicidal ideas.  All other systems reviewed and are negative.   Blood pressure 124/86, pulse 91, temperature 98 F (36.7 C), temperature source Oral, resp. rate 16, height 5\' 2"  (1.575 m), weight 61.9 kg (136 lb 6.4 oz), last menstrual period 05/17/2017, SpO2 100 %.Body mass index is 24.95 kg/m.  General Appearance: Disheveled  Eye Contact:  Minimal  Speech:  Slow  Volume:  Decreased  Mood:  Anxious, Depressed, Hopeless and Irritable  Affect:  Blunt  Thought Process:  Goal Directed and Descriptions of Associations: Intact  Orientation:  Full (Time, Place, and Person)  Thought Content:  WDL  Suicidal Thoughts:  Yes.  with intent/plan  Homicidal Thoughts:  Yes.  with intent/plan  Memory:  Immediate;   Fair Recent;   Fair Remote;   Fair  Judgement:  Poor  Insight:  Lacking  Psychomotor Activity:  Psychomotor Retardation  Concentration:  Concentration: Fair and Attention Span: Fair  Recall:  [employer] of Knowledge:  Fair  Language:  Fair  Akathisia:  No  Handed:  Right  AIMS (if indicated):     Assets:  Communication Skills Desire for Improvement Physical Health Resilience Social Support  ADL's:  Intact  Cognition:  WNL  Sleep:  Number of Hours: 6     Treatment Plan Summary: Daily contact with patient to assess and evaluate symptoms and progress in treatment and Medication management   Deborah Melendez is a 41 year old female with history of depression, mood instability and substance admitted for suicidal and homicidal threats against her boyfriend in the context of relationship problems and medication noncompliance.  1. Suicidal or homicidal ideation. The patient is able to contract for safety in the hospital.   2. Mood. She was restarted on Depakote and Seroquel for depression and  mood stabilization. We increased Seroquel to 600 mg.  3. Insomnia. Trazodone is available.  4. Anxiety. Vistaril and Neurontin are available. I will increase Neurontin to 600 mg tid.  5. Metabolic syndrome monitoring. Lipid panel and, TSH and hemoglobin A1c were done in January 2018.   6. EKG. Pending.  7. Substance abuse. The patient has a history of cocaine and cannabis abuse. She now requests referral to [facility].   8. Pregnancy test. Negative. Chlamydia negative.  9. Disposition. She will likely be discharged back with her boyfriend. She will follow up with [facility].   Orson Slick, MD 05/28/2017, 1:10 PM

## 2017-05-28 NOTE — Progress Notes (Signed)
Patient was very agitated and irritable early this morning but her mood was enlightened later in the morning. This Probation officer sat  and spoke with the patient during breakfast and patient informed this Probation officer that she was worried about her husband being deported back to [place]. Patient states, "I don't know what I'll do if he gets deported again, the last time he was gone for three months. When they came and knocked on our door last week and they told him that he was going to be deported, I just wanted to kill myself. I can't deal with this, that's why I came here and I don't want to leave. Denies HI/SI at this time but states, "No not now but next week when he goes to immigration court and I find out that my man is leaving me I don't know what I will do." Patient takes medication as prescribed. Did not attend the morning therapy class pt states...."I don't want to go in there sitting around all those people right now but I do want to go outside this afternoon when they go out, I hadn't been outside in a minute." Milieu remains calm at this time, safety checks performed q 15 minutes by nursing staff. Will continue to monitor.

## 2017-05-28 NOTE — Plan of Care (Signed)
Problem: Role Relationship: Goal: Ability to demonstrate positive changes in social behaviors and relationships will improve Outcome: Progressing Patient verbalized wanting to work things out with her husband. States, "I just want him to stop doing drugs and spending up all his money. He is a good daddy to our daughter. He came to visit me last night and I can't wait to get out on Monday. Our daughter has a birthday coming up and we need to plan her birthday party."

## 2017-05-28 NOTE — [facility] Group Notes (Signed)
[place] Group Notes:  (Nursing/MHT/Case Management/Adjunct)  Date:  05/28/2017  Time:  11:37 PM  Type of Therapy:  Group Therapy  Participation Level:  Active  Participation Quality:  Appropriate  Affect:  Appropriate  Cognitive:  Appropriate  Insight:  Appropriate  Engagement in Group:  Engaged  Modes of Intervention:  Education  Summary of Progress/Problems:  Kandis Fantasia 05/28/2017, 11:37 PM

## 2017-05-28 NOTE — Tx Team (Signed)
Interdisciplinary Treatment and Diagnostic Plan Update  05/28/2017 Time of Session: 10:30 AM Deborah Melendez MRN: [id number]  Principal Diagnosis: Major depressive disorder, recurrent severe without psychotic features ([place])  Secondary Diagnoses: Principal Problem:   Major depressive disorder, recurrent severe without psychotic features ([place]) Active Problems:   Cocaine use disorder, severe, dependence ([place])   Cannabis use disorder, severe, dependence ([place])   Suicidal ideations   Tobacco use disorder   Current Medications:  Current Facility-Administered Medications  Medication Dose Route Frequency Provider Last Rate Last Dose  . acetaminophen (TYLENOL) tablet 650 mg  650 mg Oral Q6H PRN Pucilowska, Jolanta B, MD      . alum & mag hydroxide-simeth (MAALOX/MYLANTA) 200-200-20 MG/5ML suspension 30 mL  30 mL Oral Q4H PRN Pucilowska, Jolanta B, MD      . divalproex (DEPAKOTE) DR tablet 750 mg  750 mg Oral Q12H Pucilowska, Jolanta B, MD   750 mg at 05/28/17 0747  . gabapentin (NEURONTIN) tablet 600 mg  600 mg Oral TID Pucilowska, Jolanta B, MD   600 mg at 05/28/17 1204  . hydrOXYzine (ATARAX/VISTARIL) tablet 50 mg  50 mg Oral TID PRN Pucilowska, Jolanta B, MD   50 mg at 05/27/17 1508  . magnesium hydroxide (MILK OF MAGNESIA) suspension 30 mL  30 mL Oral Daily PRN Pucilowska, Jolanta B, MD      . QUEtiapine (SEROQUEL) tablet 50 mg  50 mg Oral TID Pucilowska, Jolanta B, MD   50 mg at 05/28/17 1204  . QUEtiapine (SEROQUEL) tablet 600 mg  600 mg Oral QHS Pucilowska, Jolanta B, MD   600 mg at 05/27/17 2141   PTA Medications: Prescriptions Prior to Admission  Medication Sig Dispense Refill Last Dose  . citalopram (CELEXA) 20 MG tablet Take 1 tablet (20 mg total) by mouth daily. For depression (Patient not taking: Reported on 05/21/2017) 30 tablet 0 Not Taking at Unknown time  . divalproex (DEPAKOTE) 250 MG DR tablet Take 1 tablet (250 mg total) by mouth every 8 (eight) hours. For mood stabilization  (Patient not taking: Reported on 05/21/2017) 90 tablet 0 Not Taking at Unknown time  . gabapentin (NEURONTIN) 300 MG capsule Take 1 capsule (300 mg total) by mouth 4 (four) times daily. For agitation (Patient not taking: Reported on 05/21/2017) 120 capsule 0 Not Taking at Unknown time  . hydrOXYzine (ATARAX/VISTARIL) 50 MG tablet Take 1 tablet (50 mg total) by mouth every 6 (six) hours as needed for anxiety. (Patient not taking: Reported on 05/21/2017) 60 tablet 0 Not Taking at Unknown time  . QUEtiapine (SEROQUEL) 25 MG tablet Take 1 tablet (25 mg total) by mouth 3 (three) times daily as needed (severe anxiety/agitation). (Patient not taking: Reported on 05/21/2017) 60 tablet 0 Not Taking at Unknown time  . QUEtiapine (SEROQUEL) 400 MG tablet Take 1 tablet (400 mg total) by mouth at bedtime. For mood control (Patient not taking: Reported on 05/21/2017) 30 tablet 0 Not Taking at Unknown time  . traZODone (DESYREL) 100 MG tablet Take 1 tablet (100 mg total) by mouth at bedtime as needed for sleep. (Patient not taking: Reported on 05/21/2017) 30 tablet 0 Not Taking at Unknown time    Patient Stressors: Marital or family conflict Substance abuse  Patient Strengths: Agricultural engineer for treatment/growth  Treatment Modalities: Medication Management, Group therapy, Case management,  1 to 1 session with clinician, Psychoeducation, Recreational therapy.   Physician Treatment Plan for Primary Diagnosis: Major depressive disorder, recurrent severe without psychotic features ([place]) Long Term Goal(s): Improvement  in symptoms so as ready for discharge Improvement in symptoms so as ready for discharge   Short Term Goals: Ability to identify changes in lifestyle to reduce recurrence of condition will improve Ability to verbalize feelings will improve Ability to disclose and discuss suicidal ideas Ability to demonstrate self-control will improve Ability to identify and develop effective coping  behaviors will improve Compliance with prescribed medications will improve Ability to identify triggers associated with substance abuse/mental health issues will improve Ability to identify changes in lifestyle to reduce recurrence of condition will improve Ability to demonstrate self-control will improve Ability to identify triggers associated with substance abuse/mental health issues will improve  Medication Management: Evaluate patient's response, side effects, and tolerance of medication regimen.  Therapeutic Interventions: 1 to 1 sessions, Unit Group sessions and Medication administration.  Evaluation of Outcomes: Progressing  Physician Treatment Plan for Secondary Diagnosis: Principal Problem:   Major depressive disorder, recurrent severe without psychotic features ([place]) Active Problems:   Cocaine use disorder, severe, dependence ([place])   Cannabis use disorder, severe, dependence ([place])   Suicidal ideations   Tobacco use disorder  Long Term Goal(s): Improvement in symptoms so as ready for discharge Improvement in symptoms so as ready for discharge   Short Term Goals: Ability to identify changes in lifestyle to reduce recurrence of condition will improve Ability to verbalize feelings will improve Ability to disclose and discuss suicidal ideas Ability to demonstrate self-control will improve Ability to identify and develop effective coping behaviors will improve Compliance with prescribed medications will improve Ability to identify triggers associated with substance abuse/mental health issues will improve Ability to identify changes in lifestyle to reduce recurrence of condition will improve Ability to demonstrate self-control will improve Ability to identify triggers associated with substance abuse/mental health issues will improve     Medication Management: Evaluate patient's response, side effects, and tolerance of medication regimen.  Therapeutic Interventions: 1 to 1  sessions, Unit Group sessions and Medication administration.  Evaluation of Outcomes: Progressing   RN Treatment Plan for Primary Diagnosis: Major depressive disorder, recurrent severe without psychotic features ([place]) Long Term Goal(s): Knowledge of disease and therapeutic regimen to maintain health will improve  Short Term Goals: Ability to remain free from injury will improve, Ability to verbalize feelings will improve, Ability to disclose and discuss suicidal ideas and Compliance with prescribed medications will improve  Medication Management: RN will administer medications as ordered by provider, will assess and evaluate patient's response and provide education to patient for prescribed medication. RN will report any adverse and/or side effects to prescribing provider.  Therapeutic Interventions: 1 on 1 counseling sessions, Psychoeducation, Medication administration, Evaluate responses to treatment, Monitor vital signs and CBGs as ordered, Perform/monitor CIWA, COWS, AIMS and Fall Risk screenings as ordered, Perform wound care treatments as ordered.  Evaluation of Outcomes: Progressing   LCSW Treatment Plan for Primary Diagnosis: Major depressive disorder, recurrent severe without psychotic features ([place]) Long Term Goal(s): Safe transition to appropriate next level of care at discharge, Engage patient in therapeutic group addressing interpersonal concerns.  Short Term Goals: Engage patient in aftercare planning with referrals and resources, Increase social support, Increase ability to appropriately verbalize feelings and Increase emotional regulation  Therapeutic Interventions: Assess for all discharge needs, 1 to 1 time with Social worker, Explore available resources and support systems, Assess for adequacy in community support network, Educate family and significant other(s) on suicide prevention, Complete Psychosocial Assessment, Interpersonal group therapy.  Evaluation of Outcomes:  Progressing   Progress in Treatment:  Attending groups: No. Participating in groups: No. Taking medication as prescribed: Yes. Toleration medication: Yes. Family/Significant other contact made: No, will contact:  CSW assessing proper family contact. Patient understands diagnosis: Yes. Discussing patient identified problems/goals with staff: Yes. Medical problems stabilized or resolved: Yes. Denies suicidal/homicidal ideation: No. Issues/concerns per patient self-inventory: No.  New problem(s) identified: No, Describe:  None identified.  New Short Term/Long Term Goal(s): Patient stated that her goal is to have "medications increased."  Pt medications are adjusted.  Discharge Plan or Barriers: Patient will discharge home and follow-up with [facility].  Reason for Continuation of Hospitalization: Depression Homicidal ideation Suicidal ideation  Estimated Length of Stay: D/C May 31, 2017   Attendees: Patient: Deborah Melendez  05/28/2017 1:19 PM  Physician: Dr. Orson Slick, MD  05/28/2017 1:19 PM  Nursing: Elige Radon, BSN, RN  05/28/2017 1:19 PM  RN Care Manager: 05/28/2017 1:19 PM  Social Worker: Glorious Peach, MSW, LCSW-A 05/28/2017 1:19 PM  Recreational Therapist: Drue Flirt, LRT, [place]  05/28/2017 1:19 PM  Other:  05/28/2017 1:19 PM  Other:  05/28/2017 1:19 PM  Other: 05/28/2017 1:19 PM    Scribe for Treatment Team: Emilie Rutter, [place] 05/28/2017 1:19 PM

## 2017-05-29 MED ORDER — HYDROXYZINE HCL 25 MG PO TABS
25.0000 mg | ORAL_TABLET | Freq: Three times a day (TID) | ORAL | Status: DC | PRN
  Administered 2017-05-30: 25 mg via ORAL
  Filled 2017-05-29: qty 1

## 2017-05-29 NOTE — BHH Group Notes (Signed)
[place] Group Notes:  (Nursing/MHT/Case Management/Adjunct)  Date:  05/29/2017  Time:  11:28 PM  Type of Therapy:  Psychoeducational Skills  Participation Level:  Active  Participation Quality:  Appropriate, Attentive and Sharing  Affect:  Appropriate  Cognitive:  Appropriate  Insight:  Appropriate and Good  Engagement in Group:  Engaged  Modes of Intervention:  Discussion, Socialization and Support  Summary of Progress/Problems:  Reece Agar 05/29/2017, 11:28 PM

## 2017-05-29 NOTE — Plan of Care (Signed)
Problem: Coping: Goal: Ability to cope will improve Outcome: Progressing Patient was pleasant on contact at the beginning of this shift when approached by writer in her room.  She was cooperative with medications.  When in the company of several peers at the medication room, she was verbally hostile toward Probation officer when she asked "for my bottle of [employer]".  When told she needed to ask family to bring that, Patient demanded, "what's wrong with you people?".  She also wanted results from her xray and was encouraged to make that request of her physician.  She was hostile verbally in response, then turned to a peer and started making a conversation that sounded friendly. Patient did not participate in writer's assessment of her mood.

## 2017-05-29 NOTE — Plan of Care (Signed)
Problem: Safety: Goal: Ability to remain free from injury will improve Outcome: Progressing Pt is free from injury during hospital stay.

## 2017-05-29 NOTE — Progress Notes (Signed)
Pt very irritable and easily angered this morning. Pt has blunt affect.Visible on unit at times but she does not participate much in groups and activities.Pt has been very demanding at times with staff today. Will continue to monitor for safety.

## 2017-05-29 NOTE — Progress Notes (Addendum)
[facility] MD Progress Note  05/29/2017 1:43 PM Deborah Melendez  MRN:  [id number]  Subjective:  Deborah Melendez is a 41 year old female with a history of schizoaffective disorder admitted for homicidal ideation towards her boyfriend in the context of medication noncompliance, infidelity and substance abuse. She initially requested rehab but changed her mind. She is on multiple medications.   05/29/2017. Deborah Melendez still irritable, reports being stressed out due to legal issues of husband , court on 18th June.  Reports dry mouth. . Denies SI/HI to me. Per nursing: Still irritable, angry and homicidal at times  Principal Problem: Major depressive disorder, recurrent severe without psychotic features ([place]) Diagnosis:   Patient Active Problem List   Diagnosis Date Noted  . Tobacco use disorder [F17.200] 05/22/2017  . Major depressive disorder, recurrent severe without psychotic features ([place]) [F33.2] 05/22/2017  . Homicidal ideations [R45.850]   . Suicidal ideations [R45.851]   . Agitation [R45.1]   . Adjustment disorder with depressed mood [F43.21] 02/10/2017  . Bipolar 1 disorder, depressed, severe ([place]) [F31.4] 02/04/2017  . Panic attacks [F41.0] 01/21/2017  . 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 Cannabis use disorder, severe, dependence ([place]) [F12.20] 01/20/2017  . Mood disorder ([place]) [F39] 01/19/2017  . MDD (major depressive disorder), recurrent severe, without psychosis ([place]) [F33.2] 08/08/2015  . Cocaine use disorder, severe, dependence ([place]) [F14.20] 08/08/2015  . Cigar smoker motivated to quit [F17.290] 07/06/2014   Total Time spent with patient: 30 minutes  Past Psychiatric History: depression, substance abuse.  Past Medical History:  Past Medical History:  Diagnosis Date  . MRSA (methicillin resistant Staphylococcus aureus)    surgery on finger 3 years ago  . Substance or medication-induced  bipolar and related disorder with onset during intoxication ([place]) 09/08/2016    Past Surgical History:  Procedure Laterality Date  . FINGER SURGERY     Family History:  Family History  Problem Relation Age of Onset  . Diabetes Mother   . Hypertension Mother   . Drug abuse Father   . Schizophrenia Maternal Aunt    Family Psychiatric  History: See H&P. Social History:  History  Alcohol Use  . Yes    Comment: occasional      History  Drug Use  . Types: Methamphetamines, Cocaine    Comment: 30 days sober, just completed treatment at [facility] 8/11    Social History   Social History  . Marital status: Single    Spouse name: N/A  . Number of children: N/A  . Years of education: N/A   Social History Main Topics  . Smoking status: Current Every Day Smoker    Packs/day: 0.50    Types: Cigarettes  . Smokeless tobacco: Never Used     Comment: pt reported quiting three weeks ago  . Alcohol use Yes     Comment: occasional   . Drug use: Yes    Types: Methamphetamines, Cocaine     Comment: 30 days sober, just completed treatment at [facility] 8/11  . Sexual activity: Yes    Birth control/ protection: None   Other Topics Concern  . None   Social History Narrative  . None   Additional Social History:                         Sleep: Fair  Appetite:  Fair  Current Medications: Current Facility-Administered Medications  Medication Dose Route Frequency Provider Last Rate Last Dose  .  acetaminophen (TYLENOL) tablet 650 mg  650 mg Oral Q6H PRN Pucilowska, Jolanta B, MD      . alum & mag hydroxide-simeth (MAALOX/MYLANTA) 200-200-20 MG/5ML suspension 30 mL  30 mL Oral Q4H PRN Pucilowska, Jolanta B, MD      . divalproex (DEPAKOTE) DR tablet 750 mg  750 mg Oral Q12H Pucilowska, Jolanta B, MD   750 mg at 05/29/17 0857  . gabapentin (NEURONTIN) tablet 600 mg  600 mg Oral TID Pucilowska, Jolanta B, MD   600 mg at 05/29/17 1311  . hydrOXYzine (ATARAX/VISTARIL) tablet 50 mg  50  mg Oral TID PRN Pucilowska, Jolanta B, MD   50 mg at 05/28/17 1441  . magnesium hydroxide (MILK OF MAGNESIA) suspension 30 mL  30 mL Oral Daily PRN Pucilowska, Jolanta B, MD      . QUEtiapine (SEROQUEL) tablet 50 mg  50 mg Oral TID Pucilowska, Jolanta B, MD   50 mg at 05/29/17 1312  . QUEtiapine (SEROQUEL) tablet 600 mg  600 mg Oral QHS Pucilowska, Jolanta B, MD   600 mg at 05/28/17 2136    Lab Results:  Results for orders placed or performed during the hospital encounter of 05/22/17 (from the past 48 hour(s))  hCG, quantitative, pregnancy     Status: None   Collection Time: 05/27/17  4:27 PM  Result Value Ref Range   hCG, Beta Chain, Quant, S <1 <5 mIU/mL    Comment:          GEST. AGE      CONC.  (mIU/mL)   <=1 WEEK        5 - 50     2 WEEKS       50 - 500     3 WEEKS       100 - 10,000     4 WEEKS     1,000 - 30,000     5 WEEKS     3,500 - 115,000   6-8 WEEKS     12,000 - 270,000    12 WEEKS     15,000 - 220,000        FEMALE AND NON-PREGNANT FEMALE:     LESS THAN 5 mIU/mL   Valproic acid level     Status: None   Collection Time: 05/27/17  4:27 PM  Result Value Ref Range   Valproic Acid Lvl 84 50.0 - 100.0 ug/mL  Chlamydia/NGC rt PCR ([facility] only)     Status: None   Collection Time: 05/28/17  8:29 AM  Result Value Ref Range   Specimen source GC/Chlam URINE, RANDOM    Chlamydia Tr NOT DETECTED NOT DETECTED   N gonorrhoeae NOT DETECTED NOT DETECTED    Comment: (NOTE) 100  This methodology has not been evaluated in pregnant women or in 200  patients with a history of hysterectomy. 300 400  This methodology will not be performed on patients less than 3  years of age.     Blood Alcohol level:  Lab Results  Component Value Date   ETH <5 05/21/2017   ETH <5 98/33/8250    Metabolic Disorder Labs: Lab Results  Component Value Date   HGBA1C 5.4 01/21/2017   MPG 108 01/21/2017   No results found for: PROLACTIN Lab Results  Component Value Date   CHOL 153 01/21/2017    TRIG 89 01/21/2017   HDL 58 01/21/2017   CHOLHDL 2.6 01/21/2017   VLDL 18 01/21/2017   LDLCALC 77 01/21/2017    Physical Findings:  AIMS: Facial and Oral Movements Muscles of Facial Expression: None, normal Lips and Perioral Area: None, normal Jaw: None, normal Tongue: None, normal,Extremity Movements Upper (arms, wrists, hands, fingers): None, normal Lower (legs, knees, ankles, toes): None, normal, Trunk Movements Neck, shoulders, hips: None, normal, Overall Severity Severity of abnormal movements (highest score from questions above): None, normal Incapacitation due to abnormal movements: None, normal Patient's awareness of abnormal movements (rate only patient's report): No Awareness, Dental Status Current problems with teeth and/or dentures?: No Does patient usually wear dentures?: No  CIWA:  CIWA-Ar Total: 3 COWS:  COWS Total Score: 6  Musculoskeletal: Strength & Muscle Tone: within normal limits Gait & Station: normal Patient leans: N/A  Psychiatric Specialty Exam: Physical Exam  Nursing note and vitals reviewed. Constitutional: She is oriented to person, place, and time.  Respiratory: Effort normal.  Neurological: She is alert and oriented to person, place, and time.  Psychiatric: Her affect is blunt. Her speech is delayed. She is withdrawn. Thought content is paranoid. Cognition and memory are normal. She expresses impulsivity. She exhibits a depressed mood. She expresses homicidal and suicidal ideation.    Review of Systems  Psychiatric/Behavioral: Positive for depression, substance abuse and suicidal ideas.  All other systems reviewed and are negative.   Blood pressure 120/82, pulse 88, temperature 98.7 F (37.1 C), temperature source Oral, resp. rate 18, height 5\' 2"  (1.575 m), weight 61.9 kg (136 lb 6.4 oz), last menstrual period 05/17/2017, SpO2 100 %.Body mass index is 24.95 kg/m.  General Appearance: Disheveled  Eye Contact:  Minimal  Speech:  Slow  Volume:   Decreased  Mood:  Anxious, Depressed, Hopeless and Irritable  Affect:  Blunt  Thought Process:  Goal Directed and Descriptions of Associations: Intact  Orientation:  Full (Time, Place, and Person)  Thought Content:  WDL  Suicidal Thoughts:  Yes.  with intent/plan  Homicidal Thoughts:  Yes.  with intent/plan  Memory:  Immediate;   Fair Recent;   Fair Remote;   Fair  Judgement:  Poor  Insight:  Lacking  Psychomotor Activity:  Psychomotor Retardation  Concentration:  Concentration: Fair and Attention Span: Fair  Recall:  [employer] of Knowledge:  Fair  Language:  Fair  Akathisia:  No  Handed:  Right  AIMS (if indicated):     Assets:  Communication Skills Desire for Improvement Physical Health Resilience Social Support  ADL's:  Intact  Cognition:  WNL  Sleep:  Number of Hours: 6     Treatment Plan Summary: Daily contact with patient to assess and evaluate symptoms and progress in treatment and Medication management   Deborah Melendez is a 41 year old female with history of depression, mood instability and substance admitted for suicidal and homicidal threats against her boyfriend in the context of relationship problems and medication noncompliance.  1. Suicidal or homicidal ideation. The patient is able to contract for safety in the hospital.   2. Mood. She was restarted on Depakote and Seroquel for depression and mood stabilization. We increased Seroquel to 600 mg.  3. Insomnia. Trazodone is available.  4. Anxiety. Vistaril and Neurontin are available. Decrease Vistaril dose due to dry mouth.  5. Metabolic syndrome monitoring. Lipid panel and, TSH and hemoglobin A1c were done in January 2018.   6. EKG. Pending.  7. Substance abuse. The patient has a history of cocaine and cannabis abuse. She now requests referral to [facility].   8. Pregnancy test. Negative. Chlamydia negative.  9. Disposition. She will likely be discharged back with  her boyfriend. She will follow up  with [facility].   Lenward Chancellor, MD 05/29/2017, 1:43 PMPatient ID: Deborah Melendez, female   DOB: 1976/07/04, 41 y.o.   MRN: [id number]

## 2017-05-29 NOTE — BHH Group Notes (Signed)
[place] LCSW Group Therapy  05/29/2017 2:10 PM  Type of Therapy:  Group Therapy  Participation Level:  None  Participation Quality:  Intrusive and Inattentive  Affect:  Blunted and Irritable  Cognitive:  Alert  Insight:  None  Engagement in Therapy:  None  Modes of Intervention:  Activity, Discussion, Education, Problem-solving, Art therapist, Socialization and Support  Summary of Progress/Problems: Boundaries: Patients defined boundaries and discussed the importance having clear boundaries within their relationships. Patients identified their own boundaries. Patients established limits and rules within relationships both personally and professionally. Patients discussed ways to create and/ or improve their personal boundaries and utilizing assertive communications skills. Patient arrived to group late but left 3 different times and then did not return to group. Patient was irritable stating "I don't want no damn sheet of paper". Patient then left out of group.   Maurizio Geno G. [place], [place] 05/29/2017, 2:12 PM

## 2017-05-30 NOTE — BHH Group Notes (Signed)
[place] LCSW Group Therapy  05/30/2017 2:41 PM  Type of Therapy:  Group Therapy  Participation Level:  Patient did not attend group. CSW invited patient to group.   Summary of Progress/Problems: Developing Gratitude/Positive Thoughts- Group facilitator defined what gratitude is and how it relates to building healthy relationships. Patients were asked to write two journal entries on the following topics: "Five things I am grateful for" and "Having a positive attitude can change my life by". Patients shared their responses with the group and discussed how this can impact mental wellbeing. Patients developed understanding about the impact of thinking positively and how it can improve happiness and self-esteem.   Deborah Melendez G. [place], [place] 05/30/2017, 2:41 PM

## 2017-05-30 NOTE — Plan of Care (Signed)
Problem: Health Behavior/Discharge Planning: Goal: Compliance with therapeutic regimen will improve Outcome: Progressing Pt is compliant with all scheduled medications

## 2017-05-30 NOTE — Progress Notes (Signed)
Pt very irritable at times. Denies SI, HI, a/v hallucinations. Pt is compliant with all scheduled medications. She commits to safety on unit. Will continue to monitor for safety.

## 2017-05-30 NOTE — Progress Notes (Signed)
[facility] MD Progress Note  05/30/2017 1:53 PM Deborah Melendez  MRN:  [id number]  Subjective:  Deborah Melendez is a 41 year old female with a history of schizoaffective disorder admitted for homicidal ideation towards her boyfriend in the context of medication noncompliance, infidelity and substance abuse. She initially requested rehab but changed her mind. She is on multiple medications.   05/29/2017. Dry mouth better. Pt continue to be labile, irritable but easily redirectable. Looking forward to d/c soon. Denies SI/HI. Slept 7.45 min.   Per nursing: Still irritable, angry, hostile at times, demanding, but  Redirectable.  denies SI/HI.  Principal Problem: Major depressive disorder, recurrent severe without psychotic features ([place]) Diagnosis:   Patient Active Problem List   Diagnosis Date Noted  . Tobacco use disorder [F17.200] 05/22/2017  . Major depressive disorder, recurrent severe without psychotic features ([place]) [F33.2] 05/22/2017  . Homicidal ideations [R45.850]   . Suicidal ideations [R45.851]   . Agitation [R45.1]   . Adjustment disorder with depressed mood [F43.21] 02/10/2017  . Bipolar 1 disorder, depressed, severe ([place]) [F31.4] 02/04/2017  . Panic attacks [F41.0] 01/21/2017  . 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 Cannabis use disorder, severe, dependence ([place]) [F12.20] 01/20/2017  . Mood disorder ([place]) [F39] 01/19/2017  . MDD (major depressive disorder), recurrent severe, without psychosis ([place]) [F33.2] 08/08/2015  . Cocaine use disorder, severe, dependence ([place]) [F14.20] 08/08/2015  . Cigar smoker motivated to quit [F17.290] 07/06/2014   Total Time spent with patient: 30 minutes  Past Psychiatric History: depression, substance abuse.  Past Medical History:  Past Medical History:  Diagnosis Date  . MRSA (methicillin resistant Staphylococcus aureus)    surgery on finger 3 years ago  .  Substance or medication-induced bipolar and related disorder with onset during intoxication ([place]) 09/08/2016    Past Surgical History:  Procedure Laterality Date  . FINGER SURGERY     Family History:  Family History  Problem Relation Age of Onset  . Diabetes Mother   . Hypertension Mother   . Drug abuse Father   . Schizophrenia Maternal Aunt    Family Psychiatric  History: See H&P. Social History:  History  Alcohol Use  . Yes    Comment: occasional      History  Drug Use  . Types: Methamphetamines, Cocaine    Comment: 30 days sober, just completed treatment at [facility] 8/11    Social History   Social History  . Marital status: Single    Spouse name: N/A  . Number of children: N/A  . Years of education: N/A   Social History Main Topics  . Smoking status: Current Every Day Smoker    Packs/day: 0.50    Types: Cigarettes  . Smokeless tobacco: Never Used     Comment: pt reported quiting three weeks ago  . Alcohol use Yes     Comment: occasional   . Drug use: Yes    Types: Methamphetamines, Cocaine     Comment: 30 days sober, just completed treatment at [facility] 8/11  . Sexual activity: Yes    Birth control/ protection: None   Other Topics Concern  . None   Social History Narrative  . None   Additional Social History:                         Sleep: Fair  Appetite:  Fair  Current Medications: Current Facility-Administered Medications  Medication Dose Route Frequency Provider Last Rate Last Dose  .  acetaminophen (TYLENOL) tablet 650 mg  650 mg Oral Q6H PRN Pucilowska, Jolanta B, MD      . alum & mag hydroxide-simeth (MAALOX/MYLANTA) 200-200-20 MG/5ML suspension 30 mL  30 mL Oral Q4H PRN Pucilowska, Jolanta B, MD      . divalproex (DEPAKOTE) DR tablet 750 mg  750 mg Oral Q12H Pucilowska, Jolanta B, MD   750 mg at 05/30/17 0848  . gabapentin (NEURONTIN) tablet 600 mg  600 mg Oral TID Pucilowska, Jolanta B, MD   600 mg at 05/30/17 1304  . hydrOXYzine  (ATARAX/VISTARIL) tablet 25 mg  25 mg Oral TID PRN Lenward Chancellor, MD      . magnesium hydroxide (MILK OF MAGNESIA) suspension 30 mL  30 mL Oral Daily PRN Pucilowska, Jolanta B, MD      . QUEtiapine (SEROQUEL) tablet 50 mg  50 mg Oral TID Pucilowska, Jolanta B, MD   50 mg at 05/30/17 1305  . QUEtiapine (SEROQUEL) tablet 600 mg  600 mg Oral QHS Pucilowska, Jolanta B, MD   600 mg at 05/29/17 2100    Lab Results:  No results found for this or any previous visit (from the past 48 hour(s)).  Blood Alcohol level:  Lab Results  Component Value Date   ETH <5 05/21/2017   ETH <5 70/62/3762    Metabolic Disorder Labs: Lab Results  Component Value Date   HGBA1C 5.4 01/21/2017   MPG 108 01/21/2017   No results found for: PROLACTIN Lab Results  Component Value Date   CHOL 153 01/21/2017   TRIG 89 01/21/2017   HDL 58 01/21/2017   CHOLHDL 2.6 01/21/2017   VLDL 18 01/21/2017   LDLCALC 77 01/21/2017    Physical Findings: AIMS: Facial and Oral Movements Muscles of Facial Expression: None, normal Lips and Perioral Area: None, normal Jaw: None, normal Tongue: None, normal,Extremity Movements Upper (arms, wrists, hands, fingers): None, normal Lower (legs, knees, ankles, toes): None, normal, Trunk Movements Neck, shoulders, hips: None, normal, Overall Severity Severity of abnormal movements (highest score from questions above): None, normal Incapacitation due to abnormal movements: None, normal Patient's awareness of abnormal movements (rate only patient's report): No Awareness, Dental Status Current problems with teeth and/or dentures?: No Does patient usually wear dentures?: No  CIWA:  CIWA-Ar Total: 3 COWS:  COWS Total Score: 6  Musculoskeletal: Strength & Muscle Tone: within normal limits Gait & Station: normal Patient leans: N/A  Psychiatric Specialty Exam: Physical Exam  Nursing note and vitals reviewed. Constitutional: She is oriented to person, place, and time.   Respiratory: Effort normal.  Neurological: She is alert and oriented to person, place, and time.  Psychiatric: Her affect is blunt. Her speech is delayed. She is withdrawn. Thought content is paranoid. Cognition and memory are normal. She expresses impulsivity. She exhibits a depressed mood. She expresses homicidal and suicidal ideation.    Review of Systems  Psychiatric/Behavioral: Positive for depression, substance abuse and suicidal ideas.  All other systems reviewed and are negative.   Blood pressure 120/82, pulse 88, temperature 98.7 F (37.1 C), temperature source Oral, resp. rate 18, height 5\' 2"  (1.575 m), weight 61.9 kg (136 lb 6.4 oz), last menstrual period 05/17/2017, SpO2 100 %.Body mass index is 24.95 kg/m.  General Appearance: Disheveled  Eye Contact:  Minimal  Speech:  Slow  Volume:  Decreased  Mood:  anxious  Affect:  Irritable at times  Thought Process:  Goal Directed and Descriptions of Associations: Intact  Orientation:  Full (Time, Place, and Person)  Thought Content:  WDL  Suicidal Thoughts:  Yes.  with intent/plan  Homicidal Thoughts:  Yes.  with intent/plan  Memory:  Immediate;   Fair Recent;   Fair Remote;   Fair  Judgement:  Poor  Insight:  Lacking  Psychomotor Activity:  Psychomotor Retardation  Concentration:  Concentration: Fair and Attention Span: Fair  Recall:  [employer] of Knowledge:  Fair  Language:  Fair  Akathisia:  No  Handed:  Right  AIMS (if indicated):     Assets:  Communication Skills Desire for Improvement Physical Health Resilience Social Support  ADL's:  Intact  Cognition:  WNL  Sleep:  Number of Hours: 7.45     Treatment Plan Summary: Daily contact with patient to assess and evaluate symptoms and progress in treatment and Medication management   Ms. Demario is a 41 year old female with history of depression, mood instability and substance admitted for suicidal and homicidal threats against her boyfriend in the context of  relationship problems and medication noncompliance.  1. Suicidal or homicidal ideation. The patient is able to contract for safety in the hospital.   2. Mood. She was restarted on Depakote and Seroquel for depression and mood stabilization. We increased Seroquel to 600 mg.  3. Insomnia. Trazodone is available.  4. Anxiety. Vistaril and Neurontin are available. Decrease Vistaril dose due to dry mouth.  5. Metabolic syndrome monitoring. Lipid panel and, TSH and hemoglobin A1c were done in January 2018.   6. EKG. Pending.  7. Substance abuse. The patient has a history of cocaine and cannabis abuse. She now requests referral to [facility].   8. Pregnancy test. Negative. Chlamydia negative.  9. Disposition. She will likely be discharged back with her boyfriend. She will follow up with [facility].   Lenward Chancellor, MD 05/30/2017, 1:53 PMPatient ID: Deborah Melendez, female   DOB: 15-Apr-1976, 41 y.o.   MRN: [id number] Patient ID: Deborah Melendez, female   DOB: 10/26/1976, 41 y.o.   MRN: [id number]

## 2017-05-31 NOTE — [facility] Group Notes (Signed)
[facility] Group Notes:  (Nursing/MHT/Case Management/Adjunct)  Date:  05/31/2017  Time:  4:22 AM  Type of Therapy:  Group Therapy  Participation Level:  Active  Participation Quality:  Appropriate  Affect:  Appropriate  Cognitive:  Appropriate  Insight:  Good and Improving  Engagement in Group:  Engaged  Modes of Intervention:  n/a  Summary of Progress/Problems:  Deborah Melendez 05/31/2017, 4:22 AM

## 2017-05-31 NOTE — Progress Notes (Signed)
D: Pt denies SI/HI/AVH. Pt is less irritable, but mood is tense and she appears angry. Pt appears less anxious and she is interacting with peers and staff appropriately. Patient's thoughts are organized no bizarre behavior noted.  A: Pt was offered support and encouragement. Pt was given scheduled medications. Pt was encouraged to attend groups. Q 15 minute checks were done for safety.  R:Pt attends groups and interacts well with peers and staff. Pt is taking medication. Pt is receptive to treatment and safety maintained on unit.

## 2017-05-31 NOTE — Plan of Care (Signed)
Problem: Coping: Goal: Ability to verbalize feelings will improve Outcome: Progressing Patient verbalized feelings to staff.    

## 2017-05-31 NOTE — Discharge Summary (Signed)
Physician Discharge Summary Note  Patient:  Deborah Melendez is an 41 y.o., female MRN:  [id number] DOB:  08-Jul-1976 Patient phone:  [phone] (home)  Patient address:   [address] [place] [address],  Total Time spent with patient: 30 minutes  Date of Admission:  05/22/2017 Date of Discharge: 05/31/2017  Reason for Admission:  Suicidal ideation.  Identifying data. Ms. is a 41 year old female with history of depression, mood instability, and substance abuse.  Chief complaint. "I need to think of suicidal or homicidal."  History of present illness. Information was obtained from the patient and the chart. The patient was brought to the hospital complaining of severe anxiety and thoughts to hurt her boyfriend and herself. She proposed to cut his throat first and first next. This is in the context of his unfaithfulness. The patient reports many symptoms of depression with poor sleep, decreased appetite, anhedonia, feeling of guilt and hopelessness worthlessness, poor energy and concentration, social isolation crying spells and extreme anxiety. She has frequent severe panic attacks. She was recently hospitalized [facility] but did not take any medications due to financial difficulties. She denies any positive symptoms or symptoms suggestive of bipolar mania. She is rather irritable and readily cooperated on the interview so she does not elaborate much. She does not want to talk about substance abuse.  Past psychiatric history. There is a history of depression and mood instability. She's been tried on numerous medications but does not remember names. In the fall of last year she completed treatment at [facility].   Family psychiatric history. Unknown.  Social history. She lives with her boyfriend who is now unfaithful.  Principal Problem: Major depressive disorder, recurrent severe without psychotic features [facility]) Discharge Diagnoses: Patient Active Problem List   Diagnosis  Date Noted  . Tobacco use disorder [F17.200] 05/22/2017  . Major depressive disorder, recurrent severe without psychotic features ([place]) [F33.2] 05/22/2017  . Homicidal ideations [R45.850]   . Suicidal ideations [R45.851]   . Agitation [R45.1]   . Adjustment disorder with depressed mood [F43.21] 02/10/2017  . Bipolar 1 disorder, depressed, severe ([place]) [F31.4] 02/04/2017  . Panic attacks [F41.0] 01/21/2017  . 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 Cannabis use disorder, severe, dependence ([place]) [F12.20] 01/20/2017  . Mood disorder ([place]) [F39] 01/19/2017  . MDD (major depressive disorder), recurrent severe, without psychosis ([name]) [F33.2] 08/08/2015  . Cocaine use disorder, severe, dependence ([place]) [F14.20] 08/08/2015  . Cigar smoker motivated to quit [F17.290] 07/06/2014    Past Medical History:  Past Medical History:  Diagnosis Date  . MRSA (methicillin resistant Staphylococcus aureus)    surgery on finger 3 years ago  . Substance or medication-induced bipolar and related disorder with onset during intoxication ([place]) 09/08/2016    Past Surgical History:  Procedure Laterality Date  . FINGER SURGERY     Family History:  Family History  Problem Relation Age of Onset  . Diabetes Mother   . Hypertension Mother   . Drug abuse Father   . Schizophrenia Maternal Aunt    Social History:  History  Alcohol Use  . Yes    Comment: occasional      History  Drug Use  . Types: Methamphetamines, Cocaine    Comment: 30 days sober, just completed treatment at [facility] 8/11    Social History   Social History  . Marital status: Single    Spouse name: N/A  . Number of children: N/A  .  Years of education: N/A   Social History Main Topics  . Smoking status: Current Every Day Smoker    Packs/day: 0.50    Types: Cigarettes  . Smokeless tobacco: Never Used     Comment: pt reported quiting three weeks  ago  . Alcohol use Yes     Comment: occasional   . Drug use: Yes    Types: Methamphetamines, Cocaine     Comment: 30 days sober, just completed treatment at [facility] 8/11  . Sexual activity: Yes    Birth control/ protection: None   Other Topics Concern  . None   Social History Narrative  . None    Hospital Course:    Deborah Melendez is a 41 year old female with history of depression, mood instability and substance abuse admitted for suicidal and homicidal threats against her boyfriend in the context of relationship problems and medication noncompliance.  1. Suicidal or homicidal ideation. Resolved. The patient is able to contract for safety. She is no longer suicidal or homicidal. She isforward thinking and optimistic about the future.    2. Mood. She was restarted on Depakote and Seroquel for depression and mood stabilization.   3. Insomnia. Trazodone was available.  4. Anxiety. Vistaril and Neurontin were available.    5. Metabolic syndrome monitoring. Lipid panel, TSH and hemoglobin A1c were done in January 2018.   6. EKG. Normal sinus rhythm, QTc 413.  7. Substance abuse. The patient has a history of cocaine and cannabis abuse. She was accepted at [facility] but eventually declined.   8. Pregnancy test. Negative. Chlamydia negative.  9. Disposition. She was discharged back with her boyfriend. She will follow up with [facility].   Physical Findings: AIMS: Facial and Oral Movements Muscles of Facial Expression: None, normal Lips and Perioral Area: None, normal Jaw: None, normal Tongue: None, normal,Extremity Movements Upper (arms, wrists, hands, fingers): None, normal Lower (legs, knees, ankles, toes): None, normal, Trunk Movements Neck, shoulders, hips: None, normal, Overall Severity Severity of abnormal movements (highest score from questions above): None, normal Incapacitation due to abnormal movements: None, normal Patient's awareness of abnormal movements (rate  only patient's report): No Awareness, Dental Status Current problems with teeth and/or dentures?: No Does patient usually wear dentures?: No  CIWA:  CIWA-Ar Total: 3 COWS:  COWS Total Score: 6  Musculoskeletal: Strength & Muscle Tone: within normal limits Gait & Station: normal Patient leans: N/A  Psychiatric Specialty Exam: Physical Exam  Nursing note and vitals reviewed. Psychiatric: She has a normal mood and affect. Her speech is normal and behavior is normal. Thought content normal. Cognition and memory are normal. She expresses impulsivity.    Review of Systems  Psychiatric/Behavioral: Positive for substance abuse.  All other systems reviewed and are negative.   Blood pressure 122/73, pulse 93, temperature 98 F (36.7 C), temperature source Oral, resp. rate 18, height 5\' 2"  (1.575 m), weight 61.9 kg (136 lb 6.4 oz), last menstrual period 05/17/2017, SpO2 100 %.Body mass index is 24.95 kg/m.  General Appearance: Casual  Eye Contact:  Good  Speech:  Clear and Coherent  Volume:  Normal  Mood:  Euthymic  Affect:  Appropriate  Thought Process:  Goal Directed and Descriptions of Associations: Intact  Orientation:  Full (Time, Place, and Person)  Thought Content:  WDL  Suicidal Thoughts:  No  Homicidal Thoughts:  No  Memory:  Immediate;   Fair Recent;   Fair Remote;   Fair  Judgement:  Poor  Insight:  Lacking  Psychomotor Activity:  Normal  Concentration:  Concentration: Fair and Attention Span: Fair  Recall:  [employer] of Knowledge:  Fair  Language:  Fair  Akathisia:  No  Handed:  Right  AIMS (if indicated):     Assets:  Communication Skills Desire for Improvement Housing Intimacy Physical Health Resilience Social Support  ADL's:  Intact  Cognition:  WNL  Sleep:  Number of Hours: 6.5     Have you used any form of tobacco in the last 30 days? (Cigarettes, Smokeless Tobacco, Cigars, and/or Pipes): Yes  Has this patient used any form of tobacco in the last 30  days? (Cigarettes, Smokeless Tobacco, Cigars, and/or Pipes) Yes, Yes, A prescription for an FDA-approved tobacco cessation medication was offered at discharge and the patient refused  Blood Alcohol level:  Lab Results  Component Value Date   [facility] <5 05/21/2017   ETH <5 78/58/8502    Metabolic Disorder Labs:  Lab Results  Component Value Date   HGBA1C 5.4 01/21/2017   MPG 108 01/21/2017   No results found for: PROLACTIN Lab Results  Component Value Date   CHOL 153 01/21/2017   TRIG 89 01/21/2017   HDL 58 01/21/2017   CHOLHDL 2.6 01/21/2017   VLDL 18 01/21/2017   LDLCALC 77 01/21/2017    See Psychiatric Specialty Exam and Suicide Risk Assessment completed by Attending Physician prior to discharge.  Discharge destination:  Home  Is patient on multiple antipsychotic therapies at discharge:  No   Has Patient had three or more failed trials of antipsychotic monotherapy by history:  No  Recommended Plan for Multiple Antipsychotic Therapies: NA  Discharge Instructions    Diet - low sodium heart healthy    Complete by:  As directed    Increase activity slowly    Complete by:  As directed      Allergies as of 05/31/2017   No Known Allergies     Medication List    STOP taking these medications   citalopram 20 MG tablet Commonly known as:  CELEXA   gabapentin 300 MG capsule Commonly known as:  NEURONTIN Replaced by:  gabapentin 600 MG tablet   traZODone 100 MG tablet Commonly known as:  DESYREL     TAKE these medications     Indication  divalproex 250 MG DR tablet Commonly known as:  DEPAKOTE Take 3 tablets (750 mg total) by mouth every 12 (twelve) hours. What changed:  how much to take  when to take this  additional instructions  Indication:  Manic Phase of Manic-Depression   gabapentin 600 MG tablet Commonly known as:  NEURONTIN Take 1 tablet (600 mg total) by mouth 3 (three) times daily. Replaces:  gabapentin 300 MG capsule  Indication:  Neuropathic  Pain   hydrOXYzine 50 MG tablet Commonly known as:  ATARAX/VISTARIL Take 1 tablet (50 mg total) by mouth 3 (three) times daily as needed for anxiety. What changed:  when to take this  Indication:  Anxiety Neurosis   QUEtiapine 300 MG tablet Commonly known as:  SEROQUEL Take 2 tablets (600 mg total) by mouth at bedtime. What changed:  medication strength  how much to take  when to take this  reasons to take this  Indication:  Depressive Phase of Manic-Depression   QUEtiapine 50 MG tablet Commonly known as:  SEROQUEL Take 1 tablet (50 mg total) by mouth 3 (three) times daily. What changed:  medication strength  how much to take  when to take this  additional instructions  Indication:  Depressive Phase of Manic-Depression        Follow-up recommendations:  Activity:  as tlerated. Diet:  low sodium hrat healthy. Other:  keep follow up appointments.  Comments:    Signed: Orson Slick, MD 05/31/2017, 8:20 AM

## 2017-05-31 NOTE — Progress Notes (Signed)
Patient discharged with a pleasant and cooperative affect. Denied SI/HI and AVH. Discharge instructions that included AVS, Suicide risk assessment and transition record reviewed with patient, with verbal acknowledgement of understanding. Seven day supply of medications and personal items given to patient upon d/c. Patient transported home by her partner.

## 2017-05-31 NOTE — Progress Notes (Signed)
Recreation Therapy Notes  Date: 06.04.18 Time: 9:30 am Location: Craft Room  Group Topic: Self-expression  Goal Area(s) Addresses:  Patient will be able to identify a color that represents each emotion. Patient will verbalize benefit of using art as a means of self-expression. Patient will verbalize one emotion experienced while participating in activity.  Behavioral Response: Did not attend  Intervention: The Colors Within Me  Activity: Patients were given a blank face worksheet and were instructed to pick a color for each emotion they were feeling and show on the worksheet how much of that emotion they were feeling.  Education: LRT educated patients on other forms of self-expression.  Education Outcome: Patient did not attend group.   Clinical Observations/Feedback: Patient did not attend group.  [facility], LRT/CTRS 05/31/2017 10:15 AM

## 2017-05-31 NOTE — [facility] Group Notes (Signed)
[place] LCSW Group Therapy   05/31/2017 1:00 pm Type of Therapy: Group Therapy   Participation Level: Patient invited but did not attend.    Glorious Peach, MSW, LCSW-A 05/31/2017, 2:41PM

## 2017-05-31 NOTE — BHH Suicide Risk Assessment (Signed)
[facility] Discharge Suicide Risk Assessment   Principal Problem: Major depressive disorder, recurrent severe without psychotic features [facility]) Discharge Diagnoses:  Patient Active Problem List   Diagnosis Date Noted  . Tobacco use disorder [F17.200] 05/22/2017  . Major depressive disorder, recurrent severe without psychotic features ([place]) [F33.2] 05/22/2017  . Homicidal ideations [R45.850]   . Suicidal ideations [R45.851]   . Agitation [R45.1]   . Adjustment disorder with depressed mood [F43.21] 02/10/2017  . Bipolar 1 disorder, depressed, severe ([place]) [F31.4] 02/04/2017  . Panic attacks [F41.0] 01/21/2017  . 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 Cannabis use disorder, severe, dependence ([place]) [F12.20] 01/20/2017  . Mood disorder ([place]) [F39] 01/19/2017  . MDD (major depressive disorder), recurrent severe, without psychosis ([place]) [F33.2] 08/08/2015  . Cocaine use disorder, severe, dependence ([place]) [F14.20] 08/08/2015  . Cigar smoker motivated to quit [F17.290] 07/06/2014    Total Time spent with patient: 30 minutes  Musculoskeletal: Strength & Muscle Tone: within normal limits Gait & Station: normal Patient leans: N/A  Psychiatric Specialty Exam: Review of Systems  Psychiatric/Behavioral: Positive for substance abuse.  All other systems reviewed and are negative.   Blood pressure 122/73, pulse 93, temperature 98 F (36.7 C), temperature source Oral, resp. rate 18, height 5\' 2"  (1.575 m), weight 61.9 kg (136 lb 6.4 oz), last menstrual period 05/17/2017, SpO2 100 %.Body mass index is 24.95 kg/m.  General Appearance: Casual  Eye Contact::  Good  Speech:  Clear and Coherent409  Volume:  Normal  Mood:  Euthymic  Affect:  Appropriate and Constricted  Thought Process:  Goal Directed and Descriptions of Associations: Intact  Orientation:  Full (Time, Place, and Person)  Thought Content:  WDL   Suicidal Thoughts:  No  Homicidal Thoughts:  No  Memory:  Immediate;   Fair Recent;   Fair Remote;   Fair  Judgement:  Impaired  Insight:  Lacking  Psychomotor Activity:  Normal  Concentration:  Fair  Recall:  [employer] [place]  Language: Fair  Akathisia:  No  Handed:  Right  AIMS (if indicated):     Assets:  Communication Skills Desire for Improvement Housing Physical Health Resilience Social Support  Sleep:  Number of Hours: 6.5  Cognition: WNL  ADL's:  Intact   Mental Status Per Nursing Assessment::   On Admission:     Demographic Factors:  Low socioeconomic status and Unemployed  Loss Factors: NA  Historical Factors: Prior suicide attempts, Family history of mental illness or substance abuse and Impulsivity  Risk Reduction Factors:   Sense of responsibility to family, Living with another person, especially a relative and Positive social support  Continued Clinical Symptoms:  Depression:   Comorbid alcohol abuse/dependence Alcohol/Substance Abuse/Dependencies  Cognitive Features That Contribute To Risk:  None    Suicide Risk:  Minimal: No identifiable suicidal ideation.  Patients presenting with no risk factors but with morbid ruminations; may be classified as minimal risk based on the severity of the depressive symptoms    Plan Of Care/Follow-up recommendations:  Activity:  as tolerated. Diet:  low sodium heart healthy. Other:  keep follow up appointments.   Orson Slick, MD 05/31/2017, 8:20 AM

## 2017-05-31 NOTE — Progress Notes (Signed)
  [facility] Adult Case Management Discharge Plan :  Will you be returning to the same living situation after discharge:  Yes,  own home At discharge, do you have transportation home?: Yes,  husband Do you have the ability to pay for your medications: No. Pt will use [name].  Release of information consent forms completed and in the chart;  Patient's signature needed at discharge.  Patient to Follow up at: Follow-up Information    [facility]. Go on 06/07/2017.   Specialty:  Behavioral Health Why:  Please attend your follow up appointment with [name] on Monday, 03/21/39 at 10:00am.  Please bring a copy of your hospital discharge paperwork. Contact information: [place] [address] [phone]           Next level of care provider has access to [place] and Suicide Prevention discussed: No. Pt refused. Have you used any form of tobacco in the last 30 days? (Cigarettes, Smokeless Tobacco, Cigars, and/or Pipes): Yes  Has patient been referred to the Quitline?: Patient refused referral  Patient has been referred for addiction treatment: Yes  [name], [place] 05/31/2017, 10:26 AM

## 2017-07-27 ENCOUNTER — Emergency Department (HOSPITAL_COMMUNITY)
Admission: EM | Admit: 2017-07-27 | Discharge: 2017-07-27 | Disposition: A | Discharge status: Home / Self Care | Payer: Self-pay | Attending: Emergency Medicine | Admitting: Emergency Medicine

## 2017-07-27 ENCOUNTER — Encounter (HOSPITAL_COMMUNITY): Payer: Self-pay

## 2017-07-27 DIAGNOSIS — Z79899 Other long term (current) drug therapy: Secondary | ICD-10-CM | POA: Insufficient documentation

## 2017-07-27 DIAGNOSIS — F1414 Cocaine abuse with cocaine-induced mood disorder: Secondary | ICD-10-CM

## 2017-07-27 DIAGNOSIS — R45851 Suicidal ideations: Secondary | ICD-10-CM | POA: Insufficient documentation

## 2017-07-27 DIAGNOSIS — F329 Major depressive disorder, single episode, unspecified: Secondary | ICD-10-CM | POA: Insufficient documentation

## 2017-07-27 DIAGNOSIS — F17210 Nicotine dependence, cigarettes, uncomplicated: Secondary | ICD-10-CM | POA: Insufficient documentation

## 2017-07-27 DIAGNOSIS — F1410 Cocaine abuse, uncomplicated: Secondary | ICD-10-CM | POA: Insufficient documentation

## 2017-07-27 HISTORY — DX: Cocaine abuse with cocaine-induced mood disorder: F14.14

## 2017-07-27 LAB — CBC
HCT: 35.6 % — ABNORMAL LOW (ref 36.0–46.0)
Hemoglobin: 11.7 g/dL — ABNORMAL LOW (ref 12.0–15.0)
MCH: 27.6 pg (ref 26.0–34.0)
MCHC: 32.9 g/dL (ref 30.0–36.0)
MCV: 84 fL (ref 78.0–100.0)
PLATELETS: 207 10*3/uL (ref 150–400)
RBC: 4.24 MIL/uL (ref 3.87–5.11)
RDW: 19.4 % — AB (ref 11.5–15.5)
WBC: 5.3 10*3/uL (ref 4.0–10.5)

## 2017-07-27 LAB — COMPREHENSIVE METABOLIC PANEL
ALBUMIN: 4 g/dL (ref 3.5–5.0)
ALT: 14 U/L (ref 14–54)
ANION GAP: 6 (ref 5–15)
AST: 23 U/L (ref 15–41)
Alkaline Phosphatase: 49 U/L (ref 38–126)
BUN: 13 mg/dL (ref 6–20)
CHLORIDE: 108 mmol/L (ref 101–111)
CO2: 26 mmol/L (ref 22–32)
Calcium: 8.6 mg/dL — ABNORMAL LOW (ref 8.9–10.3)
Creatinine, Ser: 0.83 mg/dL (ref 0.44–1.00)
GFR calc Af Amer: >60 mL/min (ref >60)
Glucose, Bld: 93 mg/dL (ref 65–99)
POTASSIUM: 3.8 mmol/L (ref 3.5–5.1)
Sodium: 140 mmol/L (ref 135–145)
TOTAL PROTEIN: 7.7 g/dL (ref 6.5–8.1)
Total Bilirubin: 0.5 mg/dL (ref 0.3–1.2)

## 2017-07-27 LAB — I-STAT BETA HCG BLOOD, ED (MC, WL, AP ONLY)

## 2017-07-27 LAB — ACETAMINOPHEN LEVEL

## 2017-07-27 LAB — RAPID URINE DRUG SCREEN, HOSP PERFORMED
AMPHETAMINES: NOT DETECTED
BENZODIAZEPINES: NOT DETECTED
Barbiturates: NOT DETECTED
COCAINE: POSITIVE — AB
OPIATES: NOT DETECTED
Tetrahydrocannabinol: POSITIVE — AB

## 2017-07-27 LAB — SALICYLATE LEVEL: Salicylate Lvl: <7 mg/dL (ref 2.8–30.0)

## 2017-07-27 LAB — ETHANOL

## 2017-07-27 MED ORDER — ALUM & MAG HYDROXIDE-SIMETH 200-200-20 MG/5ML PO SUSP: 30.0000 mL | Freq: Four times a day (QID) | ORAL | Status: DC | PRN

## 2017-07-27 MED ORDER — ONDANSETRON HCL 4 MG PO TABS: 4.0000 mg | ORAL_TABLET | Freq: Three times a day (TID) | ORAL | Status: DC | PRN

## 2017-07-27 NOTE — Discharge Instructions (Addendum)
For your behavioral health needs, you are advised to follow up with one of the following providers.  Contact them at your earliest opportunity to ask about scheduling an intake appointment:       [facility]      [place], [place] [address]      [phone]      New patients are seen at the walk-in clinic every Tuesday from 9:00 am - 12:00 pm       [facility] [place], [place] [address]      [phone]      New patients are seen at their walk-in clinic.  Walk-in hours are Monday - Friday from 8:00 am - 12:00 pm, and from 1:00 pm - 3:00 pm.  Walk-in patients are seen on a first come, first served basis, so try to arrive as early as possible for the best chance of being seen the same day.  There is an initial fee of $22.50.   For your shelter needs, contact the following service providers:       [facility])      [place], [place] [address]      [phone]       [facility]      [address]      [place], [place] [address]      [phone]  For day shelter and other supportive services for the homeless, contact the [facility]):       [facility]      [place], [place] [address]      [phone]  For transitional housing, contact one of the following agencies.  They provide longer term housing than a shelter, but there is an application process:       [facility]      [address]      [place], [place] [address]      [phone]       [facility]. [address],  [address]      [phone]

## 2017-07-27 NOTE — BH Assessment (Addendum)
[place] Assessment Progress Note  Per Corena Pilgrim, MD, this pt does not require psychiatric hospitalization at this time.  Pt is to be discharged from [facility] with recommendation to follow up with [facility] or [facility].  This has been included in pt's discharge instructions.  At Dr Marquis Buggy request, pt has also been provided with information about supportive services for the homeless.  Pt's nurse has been notified.  Jalene Mullet, [place] Triage Specialist [phone]

## 2017-07-27 NOTE — ED Notes (Signed)
PT made aware of urine sample. Unable to provide at this time

## 2017-07-27 NOTE — ED Notes (Signed)
Psych Team in with the patient. 

## 2017-07-27 NOTE — BHH Suicide Risk Assessment (Signed)
Suicide Risk Assessment  Discharge Assessment   [facility] Discharge Suicide Risk Assessment   Principal Problem: Cocaine abuse with cocaine-induced mood disorder [facility]) Discharge Diagnoses:  Patient Active Problem List   Diagnosis Date Noted  . Cocaine abuse with cocaine-induced mood disorder [facility]) [F14.14] 07/27/2017    Priority: High  . Adjustment disorder with depressed mood [F43.21] 02/10/2017    Priority: High  . Bipolar 1 disorder, depressed, severe ([place]) [F31.4] 02/04/2017    Priority: High  . Cocaine use disorder, severe, dependence ([place]) [F14.20] 08/08/2015    Priority: High  . MDD (major depressive disorder), recurrent severe, without psychosis ([place]) [F33.2] 08/08/2015    Priority: Low  . Tobacco use disorder [F17.200] 05/22/2017  . Major depressive disorder, recurrent severe without psychotic features ([place]) [F33.2] 05/22/2017  . Homicidal ideations [R45.850]   . Suicidal ideations [R45.851]   . Agitation [R45.1]   . Panic attacks [F41.0] 01/21/2017  . 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 Cannabis use disorder, severe, dependence ([place]) [F12.20] 01/20/2017  . Mood disorder ([place]) [F39] 01/19/2017  . Cigar smoker motivated to quit [F17.290] 07/06/2014    Total Time spent with patient: 45 minutes  Musculoskeletal: Strength & Muscle Tone: within normal limits Gait & Station: normal Patient leans: N/A  Psychiatric Specialty Exam:   Blood pressure (!) 135/97, pulse 88, temperature 98.4 F (36.9 C), temperature source Oral, resp. rate (!) 22, height 5\' 3"  (1.6 m), weight 68 kg (150 lb), last menstrual period 07/27/2017, SpO2 100 %.Body mass index is 26.57 kg/m.  General Appearance: Casual  Eye Contact::  Good  Speech:  Normal Rate409  Volume:  Normal  Mood:  Depressed, mild  Affect:  Congruent  Thought Process:  Coherent and Descriptions of Associations: Intact  Orientation:  Full  (Time, Place, and Person)  Thought Content:  WDL and Logical  Suicidal Thoughts:  No  Homicidal Thoughts:  No  Memory:  Immediate;   Good Recent;   Good Remote;   Good  Judgement:  Fair  Insight:  Fair  Psychomotor Activity:  Normal  Concentration:  Good  Recall:  Good  Fund of Knowledge:Fair  Language: Good  Akathisia:  No  Handed:  Right  AIMS (if indicated):     Assets:  Leisure Time Physical Health Resilience  Sleep:     Cognition: WNL  ADL's:  Intact   Mental Status Per Nursing Assessment::   On Admission:   cocaine abuse with suicidal ideations.  She is upset that she and her fiance are separated and she is currently homeless.  No suicidal ideations on assessment nor homicidal ideations or hallucinations or withdrawal symptoms.  Encouraged her to go to the outpatient resources provided for her for the homeless shelter, counseling, and substance abuse.  Demographic Factors:  NA  Loss Factors: NA  Historical Factors: NA  Risk Reduction Factors:   Sense of responsibility to family and Positive social support  Continued Clinical Symptoms:  Depressed, mild  Cognitive Features That Contribute To Risk:  None    Suicide Risk:  Minimal: No identifiable suicidal ideation.  Patients presenting with no risk factors but with morbid ruminations; may be classified as minimal risk based on the severity of the depressive symptoms    Plan Of Care/Follow-up recommendations:  Activity:  as tolerated Diet:  heart healthy diet  LORD, JAMISON, NP 07/27/2017, 11:18 AM

## 2017-07-27 NOTE — ED Notes (Signed)
Bed: [facility] Expected date:  Expected time:  Means of arrival:  Comments: 

## 2017-07-27 NOTE — BH Assessment (Addendum)
Assessment Note  Deborah Melendez is an 41 y.o. female with history of substance or medication-induced Bipolar and Related Disorder with onset during intoxication. Patient presents to [facility], voluntarily. She was self referred. She presents today with suicidal thoughts. She denies a plan or intent. Onset of suicidal thoughts occurred this morning. She denies triggers or stressors. She has depressive symptoms including loss of interest in usual pleasures, isolating self from others, crying spells, and hopelessness. She sleeps 6 hours per day. Patient also reports having a fair appetite and gaining 23 pounds in the past 2 months. She denies HI. She is calm and cooperative. She has pending legal charges for traffic violations 08/05/2017. She denies AVH's. Patient does not appear to be responding to internal stimuli. She denies alcohol abuse. Patient reports use of cocaine 1x per week. Last used cocaine 3-4 days ago. She denies withdrawal symptoms. Denies having a outpatient therapist or psychiatrist. She reports prior inpatient hospitalizations @ [facility] 01/2017, 12/2016, 07/2016, 08/2016, and 08/08/2015.  Diagnosis: Bipolar Disorder, Substance Induced Mood Disorder, and Cocaine Use Disorder  Past Medical History:  Past Medical History:  Diagnosis Date  . MRSA (methicillin resistant Staphylococcus aureus)    surgery on finger 3 years ago  . Substance or medication-induced bipolar and related disorder with onset during intoxication ([place]) 09/08/2016    Past Surgical History:  Procedure Laterality Date  . FINGER SURGERY      Family History:  Family History  Problem Relation Age of Onset  . Diabetes Mother   . Hypertension Mother   . Drug abuse Father   . Schizophrenia Maternal Aunt     Social History:  reports that she has been smoking Cigarettes.  She has been smoking about 0.50 packs per day. She has never used smokeless tobacco. She reports that she drinks alcohol. She reports that she uses drugs,  including Cocaine.  Additional Social History:  Alcohol / Drug Use Pain Medications: denies Prescriptions: denies Over the Counter: denies History of alcohol / drug use?: Yes Longest period of sobriety (when/how long): Unknown Negative Consequences of Use: Personal relationships Substance #1 Name of Substance 1: cocaine  1 - Age of First Use: 41 yrs old  1 - Amount (size/oz): "I don't know...but It's not alot because I don't have money" 1 - Frequency: 1x per week  1 - Duration: on-going  1 - Last Use / Amount: 3-4 days ago   CIWA: CIWA-Ar BP: (!) 135/97 Pulse Rate: 88 COWS:    Allergies: No Known Allergies  Home Medications:  (Not in a hospital admission)  OB/GYN Status:  Patient's last menstrual period was 07/27/2017.  General Assessment Data [facility] of [facility]: [facility] TTS Assessment: In system Is this a Tele or Face-to-Face Assessment?: Face-to-Face Is this an Initial Assessment or a Re-assessment for this encounter?: Initial Assessment Marital status: Single Maiden name:  Beiser ) Is patient pregnant?: No Pregnancy Status: No Living Arrangements: Spouse/significant other Can pt return to current living arrangement?: Yes Admission Status: Voluntary Is patient capable of signing voluntary admission?: Yes Referral Source: Self/Family/Friend Insurance type:  (Medicaid )     Crisis Care Plan Living Arrangements: Spouse/significant other Legal Guardian: Other: (no legal guardian ) Name of Psychiatrist:  (no psychiatrist ) Name of Therapist:  (no therapist )  Education Status Is patient currently in school?: No Current Grade:  (n/a) Highest grade of school patient has completed:  (11th grade ) Name of school:  (n/a) Contact person:  (n/a)  Risk to self with the  past 6 months Suicidal Ideation: Yes-Currently Present Has patient been a risk to self within the past 6 months prior to admission? : Yes Suicidal Intent: Yes-Currently Present Has patient had  any suicidal intent within the past 6 months prior to admission? : Yes Is patient at risk for suicide?: Yes Suicidal Plan?: No Has patient had any suicidal plan within the past 6 months prior to admission? : No Specify Current Suicidal Plan:  (n/a) Access to Means: Yes Specify Access to Suicidal Means:  (sharp objects) What has been your use of drugs/alcohol within the last 12 months?:  (cocaine ) Previous Attempts/Gestures: Yes How many times?:  (3x's-cut wrist and OD) Other Self Harm Risks:  (patient denies ) Triggers for Past Attempts: Other (Comment) (depression ) Intentional Self Injurious Behavior: None Family Suicide History: Yes ("My maternal aunt has mental health issues.") Recent stressful life event(s): Other (Comment) (no stressors ) Persecutory voices/beliefs?: No Depression: Yes Depression Symptoms: Feeling worthless/self pity, Feeling angry/irritable, Loss of interest in usual pleasures, Guilt, Isolating, Tearfulness Substance abuse history and/or treatment for substance abuse?: No Suicide prevention information given to non-admitted patients: Not applicable  Risk to Others within the past 6 months Homicidal Ideation: No Does patient have any lifetime risk of violence toward others beyond the six months prior to admission? : No Thoughts of Harm to Others: No Current Homicidal Intent: No Current Homicidal Plan: No Access to Homicidal Means: No Identified Victim:  (n/a) History of harm to others?: No Assessment of Violence: None Noted Violent Behavior Description:  (currently calm and cooperative ) Does patient have access to weapons?: No Criminal Charges Pending?: No Does patient have a court date: No Is patient on probation?: No  Psychosis Hallucinations: None noted Delusions: None noted  Mental Status Report Appearance/Hygiene: In scrubs Eye Contact: Good Motor Activity: Freedom of movement Speech: Logical/coherent Level of Consciousness: Alert Mood:  Depressed Affect: Appropriate to circumstance Anxiety Level: None Thought Processes: Relevant, Coherent Judgement: Impaired Orientation: Person, Place, Time, Situation Obsessive Compulsive Thoughts/Behaviors: None  Cognitive Functioning Concentration: Decreased Memory: Recent Intact, Remote Intact IQ: Average Insight: Poor Impulse Control: Poor Appetite: Good Weight Loss:  (none reported) Weight Gain:  (25 pounds in 2 months ) Sleep: Decreased Total Hours of Sleep:  (6 hours of sleep) Vegetative Symptoms: None  ADLScreening [facility] Assessment Services) Patient's cognitive ability adequate to safely complete daily activities?: Yes Patient able to express need for assistance with ADLs?: Yes Independently performs ADLs?: Yes (appropriate for developmental age)  Prior Inpatient Therapy Prior Inpatient Therapy: Yes Prior Therapy Dates:  ("I can't remember but I know I was at [facility] in the past") Prior Therapy Facilty/Provider(s):  [facility]...per chart last visit was 01/2017) Reason for Treatment:  (depression )  Prior Outpatient Therapy Prior Outpatient Therapy: No Prior Therapy Facilty/Provider(s):  (n/a) Reason for Treatment:  (n/a) Does patient have an ACCT team?: No Does patient have Intensive In-House Services?  : No Does patient have [facility] services? : No Does patient have [facility] services?: No  ADL Screening (condition at time of admission) Patient's cognitive ability adequate to safely complete daily activities?: Yes Is the patient deaf or have difficulty hearing?: No Does the patient have difficulty seeing, even when wearing glasses/contacts?: No Does the patient have difficulty concentrating, remembering, or making decisions?: No Patient able to express need for assistance with ADLs?: Yes Does the patient have difficulty dressing or bathing?: No Independently performs ADLs?: Yes (appropriate for developmental age) Weakness of Legs: None Weakness of Arms/Hands: None  Home  Assistive Devices/Equipment Home Assistive Devices/Equipment: None    Abuse/Neglect Assessment (Assessment to be complete while patient is alone) Verbal Abuse: Denies Sexual Abuse: Denies Exploitation of patient/patient's resources: Denies Self-Neglect: Denies Values / Beliefs Cultural Requests During Hospitalization: None Spiritual Requests During Hospitalization: None   Advance Directives (For Healthcare) Does Patient Have a Medical Advance Directive?: No Would patient like information on creating a medical advance directive?: No - Patient declined Nutrition Screen- [facility] Patient's home diet: Regular  Additional Information 1:1 In Past 12 Months?: No CIRT Risk: No Elopement Risk: Yes     Disposition: Per Dr. Darleene Cleaver and Waylan Boga, DNP, patient does not meet criteria for INPT treatment. Discharge and follow up with [facility] for medication management and therapy.  Disposition Initial Assessment Completed for this Encounter: Yes  On Site Evaluation by:   Reviewed with Physician:    Waldon Merl 07/27/2017 9:03 AM

## 2017-07-27 NOTE — ED Triage Notes (Signed)
Patient states she is feeling suicidal today. "I woke up feeling this way. I need to come up with a plan." Patient denies any auditory or visual hallucinations. Patient states she last used cocaine 3-4 days ago. Patient states she last drank alcohol 2 days ago.

## 2017-07-27 NOTE — ED Notes (Signed)
Signature pad not working. 

## 2017-07-27 NOTE — ED Provider Notes (Signed)
[facility] DEPT Provider Note   CSN: [id number] Arrival date & time: 07/27/17  5027     History   Chief Complaint Chief Complaint  Patient presents with  . Suicidal    HPI Deborah Melendez is a 41 y.o. female.  HPI Patient presents with depression and suicidal thoughts. Began yesterday. Has severe crying spells. Wants to die but does not have a plan yet. Does have some substance abuse. Occasional cocaine use. States she last used for 5 days ago. States that she has quit some. Denies other substance abuse. Will occasionally drink some alcohol. States she does not drink everyday. States she will not withdraw from alcohol. Denies suicide attempt now. Has had previous suicide attempt. States she is on psychiatric medicines and has been taking them but cannot name the medicines.` Past Medical History:  Diagnosis Date  . MRSA (methicillin resistant Staphylococcus aureus)    surgery on finger 3 years ago  . Substance or medication-induced bipolar and related disorder with onset during intoxication ([place]) 09/08/2016    Patient Active Problem List   Diagnosis Date Noted  . Tobacco use disorder 05/22/2017  . Major depressive disorder, recurrent severe without psychotic features ([place]) 05/22/2017  . Homicidal ideations   . Suicidal ideations   . Agitation   . Adjustment disorder with depressed mood 02/10/2017  . Bipolar 1 disorder, depressed, severe ([place]) 02/04/2017  . Panic attacks 01/21/2017  . 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 Cannabis use disorder, severe, dependence ([place]) 01/20/2017  . Mood disorder ([place]) 01/19/2017  . MDD (major depressive disorder), recurrent severe, without psychosis ([place]) 08/08/2015  . Cocaine use disorder, severe, dependence ([place]) 08/08/2015  . Cigar smoker motivated to quit 07/06/2014    Past Surgical History:  Procedure Laterality Date  . FINGER SURGERY      OB History    Gravida Para Term Preterm AB Living   7 5       5    SAB TAB Ectopic Multiple Live Births           1       Home Medications    Prior to Admission medications   Medication Sig Start Date End Date Taking? Authorizing Provider  divalproex (DEPAKOTE) 250 MG DR tablet Take 3 tablets (750 mg total) by mouth every 12 (twelve) hours. 05/28/17  Yes Pucilowska, Jolanta B, MD  gabapentin (NEURONTIN) 600 MG tablet Take 1 tablet (600 mg total) by mouth 3 (three) times daily. 05/28/17   Pucilowska, Herma Ard B, MD  hydrOXYzine (ATARAX/VISTARIL) 50 MG tablet Take 1 tablet (50 mg total) by mouth 3 (three) times daily as needed for anxiety. 05/28/17   Pucilowska, Jolanta B, MD  QUEtiapine (SEROQUEL) 300 MG tablet Take 2 tablets (600 mg total) by mouth at bedtime. 05/28/17   Pucilowska, Jolanta B, MD  QUEtiapine (SEROQUEL) 50 MG tablet Take 1 tablet (50 mg total) by mouth 3 (three) times daily. 05/28/17   Pucilowska, Wardell Honour, MD    Family History Family History  Problem Relation Age of Onset  . Diabetes Mother   . Hypertension Mother   . Drug abuse Father   . Schizophrenia Maternal Aunt     Social History Social History  Substance Use Topics  . Smoking status: Current Every Day Smoker    Packs/day: 0.50    Types: Cigarettes  . Smokeless tobacco: Never Used     Comment: pt reported quiting three weeks ago  . Alcohol use Yes  Comment: occasional      Allergies   Patient has no known allergies.   Review of Systems Review of Systems  Constitutional: Negative for appetite change and fever.  HENT: Negative for congestion.   Eyes: Negative for visual disturbance.  Respiratory: Negative for shortness of breath.   Cardiovascular: Negative for chest pain.  Gastrointestinal: Negative for abdominal pain.  Genitourinary: Negative for flank pain.  Musculoskeletal: Negative for back pain.  Skin: Negative for pallor.  Neurological: Negative for syncope.  Psychiatric/Behavioral: Positive for dysphoric  mood and suicidal ideas.     Physical Exam Updated Vital Signs BP (!) 135/97 (BP Location: Left Arm)   Pulse 88   Temp 98.4 F (36.9 C) (Oral)   Resp (!) 22   Ht 5\' 3"  (1.6 m)   Wt 68 kg (150 lb)   LMP 07/27/2017   SpO2 100%   BMI 26.57 kg/m   Physical Exam  Constitutional: She appears well-developed.  HENT:  Head: Normocephalic.  Eyes: Pupils are equal, round, and reactive to light.  Neck: Neck supple.  Cardiovascular: Normal rate.   Pulmonary/Chest: Effort normal.  Abdominal: Soft.  Musculoskeletal: Normal range of motion. She exhibits no edema.  Neurological: She is alert.  Skin: Skin is warm. Capillary refill takes less than 2 seconds.  Psychiatric:  Patient is tearful     ED Treatments / Results  Labs (all labs ordered are listed, but only abnormal results are displayed) Labs Reviewed  COMPREHENSIVE METABOLIC PANEL  ETHANOL  SALICYLATE LEVEL  ACETAMINOPHEN LEVEL  CBC  RAPID URINE DRUG SCREEN, HOSP PERFORMED  VALPROIC ACID LEVEL  I-STAT BETA HCG BLOOD, ED ([facility], [facility], [facility] ONLY)    EKG  EKG Interpretation None       Radiology No results found.  Procedures Procedures (including critical care time)  Medications Ordered in ED Medications  ondansetron (ZOFRAN) tablet 4 mg (not administered)  alum & mag hydroxide-simeth (MAALOX/MYLANTA) 200-200-20 MG/5ML suspension 30 mL (not administered)     Initial Impression / Assessment and Plan / ED Course  I have reviewed the triage vital signs and the nursing notes.  Pertinent labs & imaging results that were available during my care of the patient were reviewed by me and considered in my medical decision making (see chart for details).     Patient with depression and suicidal thoughts. Also cocaine abuse. Doesn't have clear plan to states she does want to die. Tearful on my exam. Previous suicide attempt. Lab still pending but likely medically cleared. To be seen by TTS.  Final Clinical Impressions(s) /  ED Diagnoses   Final diagnoses:  Cocaine abuse  Suicidal ideation    New Prescriptions New Prescriptions   No medications on file     [name], MD 07/27/17 [id number]

## 2017-10-08 ENCOUNTER — Emergency Department
Admission: EM | Admit: 2017-10-08 | Discharge: 2017-10-08 | Disposition: A | Discharge status: Other Acute Inpatient Hospital | Payer: Self-pay | Attending: Emergency Medicine | Admitting: Emergency Medicine

## 2017-10-08 ENCOUNTER — Inpatient Hospital Stay
Admission: AD | Admit: 2017-10-08 | Discharge: 2017-10-15 | DRG: 885 | Disposition: A | Discharge status: Home / Self Care | Payer: No Typology Code available for payment source | Attending: Psychiatry | Admitting: Psychiatry

## 2017-10-08 DIAGNOSIS — F332 Major depressive disorder, recurrent severe without psychotic features: Principal | ICD-10-CM | POA: Diagnosis present

## 2017-10-08 DIAGNOSIS — Z23 Encounter for immunization: Secondary | ICD-10-CM

## 2017-10-08 DIAGNOSIS — M25569 Pain in unspecified knee: Secondary | ICD-10-CM | POA: Diagnosis present

## 2017-10-08 DIAGNOSIS — R45851 Suicidal ideations: Secondary | ICD-10-CM | POA: Diagnosis present

## 2017-10-08 DIAGNOSIS — Z79899 Other long term (current) drug therapy: Secondary | ICD-10-CM | POA: Insufficient documentation

## 2017-10-08 DIAGNOSIS — F1220 Cannabis dependence, uncomplicated: Secondary | ICD-10-CM | POA: Diagnosis present

## 2017-10-08 DIAGNOSIS — F1410 Cocaine abuse, uncomplicated: Secondary | ICD-10-CM | POA: Diagnosis present

## 2017-10-08 DIAGNOSIS — Z8614 Personal history of Methicillin resistant Staphylococcus aureus infection: Secondary | ICD-10-CM

## 2017-10-08 DIAGNOSIS — D509 Iron deficiency anemia, unspecified: Secondary | ICD-10-CM | POA: Diagnosis present

## 2017-10-08 DIAGNOSIS — F1420 Cocaine dependence, uncomplicated: Secondary | ICD-10-CM | POA: Diagnosis present

## 2017-10-08 DIAGNOSIS — F17210 Nicotine dependence, cigarettes, uncomplicated: Secondary | ICD-10-CM | POA: Diagnosis present

## 2017-10-08 DIAGNOSIS — F1910 Other psychoactive substance abuse, uncomplicated: Secondary | ICD-10-CM

## 2017-10-08 DIAGNOSIS — F32A Depression, unspecified: Secondary | ICD-10-CM

## 2017-10-08 DIAGNOSIS — F410 Panic disorder [episodic paroxysmal anxiety] without agoraphobia: Secondary | ICD-10-CM | POA: Diagnosis present

## 2017-10-08 DIAGNOSIS — F1920 Other psychoactive substance dependence, uncomplicated: Secondary | ICD-10-CM | POA: Insufficient documentation

## 2017-10-08 DIAGNOSIS — N390 Urinary tract infection, site not specified: Secondary | ICD-10-CM | POA: Diagnosis present

## 2017-10-08 DIAGNOSIS — Z818 Family history of other mental and behavioral disorders: Secondary | ICD-10-CM

## 2017-10-08 DIAGNOSIS — F17200 Nicotine dependence, unspecified, uncomplicated: Secondary | ICD-10-CM | POA: Diagnosis present

## 2017-10-08 DIAGNOSIS — F329 Major depressive disorder, single episode, unspecified: Secondary | ICD-10-CM | POA: Insufficient documentation

## 2017-10-08 DIAGNOSIS — F3181 Bipolar II disorder: Secondary | ICD-10-CM | POA: Diagnosis not present

## 2017-10-08 LAB — CBC WITH DIFFERENTIAL/PLATELET
BASOS PCT: 1 %
Basophils Absolute: 0 10*3/uL (ref 0–0.1)
Eosinophils Absolute: 0.3 10*3/uL (ref 0–0.7)
Eosinophils Relative: 5 %
HEMATOCRIT: 30.2 % — AB (ref 35.0–47.0)
Hemoglobin: 9.8 g/dL — ABNORMAL LOW (ref 12.0–16.0)
Lymphocytes Relative: 21 %
Lymphs Abs: 1.3 10*3/uL (ref 1.0–3.6)
MCH: 26.5 pg (ref 26.0–34.0)
MCHC: 32.3 g/dL (ref 32.0–36.0)
MCV: 81.8 fL (ref 80.0–100.0)
MONO ABS: 0.4 10*3/uL (ref 0.2–0.9)
MONOS PCT: 7 %
NEUTROS ABS: 4.2 10*3/uL (ref 1.4–6.5)
Neutrophils Relative %: 68 %
Platelets: 337 10*3/uL (ref 150–440)
RBC: 3.69 MIL/uL — ABNORMAL LOW (ref 3.80–5.20)
RDW: 20.7 % — AB (ref 11.5–14.5)
WBC: 6.2 10*3/uL (ref 3.6–11.0)

## 2017-10-08 LAB — URINALYSIS, COMPLETE (UACMP) WITH MICROSCOPIC
Bacteria, UA: NONE SEEN
Bilirubin Urine: NEGATIVE
Glucose, UA: NEGATIVE mg/dL
KETONES UR: NEGATIVE mg/dL
Nitrite: NEGATIVE
PH: 6 (ref 5.0–8.0)
PROTEIN: 30 mg/dL — AB
Specific Gravity, Urine: 1.026 (ref 1.005–1.030)

## 2017-10-08 LAB — COMPREHENSIVE METABOLIC PANEL
ALK PHOS: 50 U/L (ref 38–126)
ALT: 11 U/L — ABNORMAL LOW (ref 14–54)
AST: 22 U/L (ref 15–41)
Albumin: 3.7 g/dL (ref 3.5–5.0)
Anion gap: 8 (ref 5–15)
BILIRUBIN TOTAL: 0.4 mg/dL (ref 0.3–1.2)
BUN: 10 mg/dL (ref 6–20)
CALCIUM: 8.7 mg/dL — AB (ref 8.9–10.3)
CO2: 24 mmol/L (ref 22–32)
CREATININE: 0.9 mg/dL (ref 0.44–1.00)
Chloride: 107 mmol/L (ref 101–111)
GFR calc Af Amer: >60 mL/min (ref >60)
GLUCOSE: 102 mg/dL — AB (ref 65–99)
Potassium: 3.9 mmol/L (ref 3.5–5.1)
Sodium: 139 mmol/L (ref 135–145)
Total Protein: 7.4 g/dL (ref 6.5–8.1)

## 2017-10-08 LAB — ACETAMINOPHEN LEVEL: Acetaminophen (Tylenol), Serum: <10 ug/mL — ABNORMAL LOW (ref 10–30)

## 2017-10-08 LAB — POCT PREGNANCY, URINE: Preg Test, Ur: NEGATIVE

## 2017-10-08 LAB — SALICYLATE LEVEL

## 2017-10-08 LAB — URINE DRUG SCREEN, QUALITATIVE (ARMC ONLY)
Amphetamines, Ur Screen: NOT DETECTED
BARBITURATES, UR SCREEN: NOT DETECTED
Benzodiazepine, Ur Scrn: NOT DETECTED
CANNABINOID 50 NG, UR ~~LOC~~: POSITIVE — AB
COCAINE METABOLITE, UR ~~LOC~~: POSITIVE — AB
MDMA (Ecstasy)Ur Screen: NOT DETECTED
METHADONE SCREEN, URINE: NOT DETECTED
OPIATE, UR SCREEN: NOT DETECTED
PHENCYCLIDINE (PCP) UR S: NOT DETECTED
Tricyclic, Ur Screen: NOT DETECTED

## 2017-10-08 LAB — ETHANOL: Alcohol, Ethyl (B): <10 mg/dL (ref <10)

## 2017-10-08 MED ORDER — GUAIFENESIN ER 600 MG PO TB12
600.0000 mg | ORAL_TABLET | Freq: Two times a day (BID) | ORAL | Status: DC | PRN
  Administered 2017-10-09: 600 mg via ORAL
  Filled 2017-10-08 (×2): qty 1

## 2017-10-08 MED ORDER — ALUM & MAG HYDROXIDE-SIMETH 200-200-20 MG/5ML PO SUSP
30.0000 mL | ORAL | Status: DC | PRN
  Administered 2017-10-14 (×2): 30 mL via ORAL
  Filled 2017-10-08 (×2): qty 30

## 2017-10-08 MED ORDER — INFLUENZA VAC SPLIT QUAD 0.5 ML IM SUSY
0.5000 mL | PREFILLED_SYRINGE | INTRAMUSCULAR | Status: AC
  Administered 2017-10-09: 0.5 mL via INTRAMUSCULAR
  Filled 2017-10-08: qty 0.5

## 2017-10-08 MED ORDER — QUETIAPINE FUMARATE 200 MG PO TABS
600.0000 mg | ORAL_TABLET | Freq: Every day | ORAL | Status: DC
  Administered 2017-10-08 – 2017-10-14 (×7): 600 mg via ORAL
  Filled 2017-10-08 (×7): qty 3

## 2017-10-08 MED ORDER — MAGNESIUM HYDROXIDE 400 MG/5ML PO SUSP: 30.0000 mL | Freq: Every day | ORAL | Status: DC | PRN

## 2017-10-08 MED ORDER — DIVALPROEX SODIUM 500 MG PO DR TAB
750.0000 mg | DELAYED_RELEASE_TABLET | Freq: Two times a day (BID) | ORAL | Status: DC
  Administered 2017-10-09 – 2017-10-15 (×13): 750 mg via ORAL
  Filled 2017-10-08 (×13): qty 1

## 2017-10-08 MED ORDER — GABAPENTIN 600 MG PO TABS
600.0000 mg | ORAL_TABLET | Freq: Three times a day (TID) | ORAL | Status: DC
  Administered 2017-10-08: 600 mg via ORAL
  Filled 2017-10-08: qty 1

## 2017-10-08 MED ORDER — GABAPENTIN 600 MG PO TABS
600.0000 mg | ORAL_TABLET | Freq: Three times a day (TID) | ORAL | Status: DC
  Administered 2017-10-09 – 2017-10-15 (×21): 600 mg via ORAL
  Filled 2017-10-08 (×21): qty 1

## 2017-10-08 MED ORDER — QUETIAPINE FUMARATE 300 MG PO TABS: 600.0000 mg | ORAL_TABLET | Freq: Every day | ORAL | Status: DC

## 2017-10-08 MED ORDER — DIVALPROEX SODIUM 500 MG PO DR TAB
750.0000 mg | DELAYED_RELEASE_TABLET | Freq: Two times a day (BID) | ORAL | Status: DC
  Administered 2017-10-08: 750 mg via ORAL
  Filled 2017-10-08: qty 1

## 2017-10-08 MED ORDER — HYDROXYZINE HCL 50 MG PO TABS
50.0000 mg | ORAL_TABLET | Freq: Three times a day (TID) | ORAL | Status: DC | PRN
  Administered 2017-10-10 – 2017-10-15 (×5): 50 mg via ORAL
  Filled 2017-10-08 (×5): qty 1

## 2017-10-08 MED ORDER — QUETIAPINE FUMARATE 25 MG PO TABS
50.0000 mg | ORAL_TABLET | Freq: Three times a day (TID) | ORAL | Status: DC
  Administered 2017-10-08: 50 mg via ORAL
  Filled 2017-10-08: qty 2

## 2017-10-08 MED ORDER — ACETAMINOPHEN 325 MG PO TABS
650.0000 mg | ORAL_TABLET | Freq: Four times a day (QID) | ORAL | Status: DC | PRN
  Administered 2017-10-09 – 2017-10-15 (×9): 650 mg via ORAL
  Filled 2017-10-08 (×9): qty 2

## 2017-10-08 MED ORDER — ACETAMINOPHEN 500 MG PO TABS
1000.0000 mg | ORAL_TABLET | ORAL | Status: AC
  Administered 2017-10-08: 1000 mg via ORAL
  Filled 2017-10-08: qty 2

## 2017-10-08 MED ORDER — QUETIAPINE FUMARATE 25 MG PO TABS
50.0000 mg | ORAL_TABLET | Freq: Three times a day (TID) | ORAL | Status: DC
  Administered 2017-10-09 – 2017-10-15 (×21): 50 mg via ORAL
  Filled 2017-10-08 (×21): qty 2

## 2017-10-08 MED ORDER — HYDROXYZINE HCL 25 MG PO TABS
50.0000 mg | ORAL_TABLET | Freq: Three times a day (TID) | ORAL | Status: DC | PRN
  Administered 2017-10-08: 50 mg via ORAL
  Filled 2017-10-08: qty 2

## 2017-10-08 MED ORDER — NICOTINE 21 MG/24HR TD PT24
21.0000 mg | MEDICATED_PATCH | Freq: Every day | TRANSDERMAL | Status: DC
  Administered 2017-10-09 – 2017-10-15 (×6): 21 mg via TRANSDERMAL
  Filled 2017-10-08 (×7): qty 1

## 2017-10-08 MED ORDER — GUAIFENESIN ER 600 MG PO TB12
600.0000 mg | ORAL_TABLET | Freq: Two times a day (BID) | ORAL | Status: DC | PRN
  Administered 2017-10-08: 600 mg via ORAL
  Filled 2017-10-08: qty 1

## 2017-10-08 NOTE — BH Assessment (Signed)
Patient recommended inpatient treatment by [facility]. Patient is to be admitted to [facility] by Dr. Bary Leriche and will enter admission orders. Attending Physician will be Dr. Bary Leriche.   Patient has been assigned to room 309, by [facility] staff is aware of the admission Haskell Flirt, ER Sect.; Dr. Jacqualine Code, ER MD; Drema Halon., Patient's Nurse & Meredith Mody, Patient Access).

## 2017-10-08 NOTE — ED Triage Notes (Signed)
Pt arrived to ED with suicidal thoughts that started yesterday. Hx of depression. Pt is not taking medications or being seen by psych.

## 2017-10-08 NOTE — ED Notes (Signed)
SOC at bedside, MD on screen

## 2017-10-08 NOTE — ED Notes (Signed)
Preparing patient for transfer to [facility] 

## 2017-10-08 NOTE — ED Notes (Signed)
Patient took tylenol for generalized aches and pain, Patient is calm and pleasant. q 15 minute checks, camera surveillance in progress for safety.

## 2017-10-08 NOTE — ED Notes (Signed)
[place] [phone])

## 2017-10-08 NOTE — ED Provider Notes (Addendum)
[facility] Emergency Department Provider Note ____________________________________________   I have reviewed the triage vital signs and the triage nursing note.  HISTORY  Chief Complaint Psychiatric Evaluation   Historian Patient  HPI Deborah Melendez is a 41 y.o. female with a history of cocaine and mj abuse as well as depression and suicidal thoughts, reports off depression medication for a few months, Reports last hospitalization for psychiatric complaints was several months ago, presents stating that she just can't stay where she has right now because she is feeling worsening depression and she is feeling suicidal. She does not have a particular plan in mind. She did not try anything.  Patient states that she would like to move to [place] and the family is going to come pick her up in several days, but she feels like she needs to her depression under control first.  Last used cocaine and marijuana 1-2 days ago.    Past Medical History:  Diagnosis Date  . MRSA (methicillin resistant Staphylococcus aureus)    surgery on finger 3 years ago  . Substance or medication-induced bipolar and related disorder with onset during intoxication ([place]) 09/08/2016    Patient Active Problem List   Diagnosis Date Noted  . Cocaine abuse with cocaine-induced mood disorder ([place]) 07/27/2017  . Tobacco use disorder 05/22/2017  . Major depressive disorder, recurrent severe without psychotic features ([place]) 05/22/2017  . Homicidal ideations   . Suicidal ideations   . Agitation   . Adjustment disorder with depressed mood 02/10/2017  . Bipolar 1 disorder, depressed, severe ([place]) 02/04/2017  . Panic attacks 01/21/2017  . 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 Cannabis use disorder, severe, dependence ([place]) 01/20/2017  . Mood disorder ([place]) 01/19/2017  . MDD (major depressive disorder), recurrent severe,  without psychosis ([place]) 08/08/2015  . Cocaine use disorder, severe, dependence ([place]) 08/08/2015  . Cigar smoker motivated to quit 07/06/2014    Past Surgical History:  Procedure Laterality Date  . FINGER SURGERY      Prior to Admission medications   Medication Sig Start Date End Date Taking? Authorizing Provider  divalproex (DEPAKOTE) 250 MG DR tablet Take 3 tablets (750 mg total) by mouth every 12 (twelve) hours. Patient not taking: Reported on 10/08/2017 05/28/17   Pucilowska, Herma Ard B, MD  gabapentin (NEURONTIN) 600 MG tablet Take 1 tablet (600 mg total) by mouth 3 (three) times daily. Patient not taking: Reported on 10/08/2017 05/28/17   Pucilowska, Herma Ard B, MD  hydrOXYzine (ATARAX/VISTARIL) 50 MG tablet Take 1 tablet (50 mg total) by mouth 3 (three) times daily as needed for anxiety. Patient not taking: Reported on 10/08/2017 05/28/17   Pucilowska, Herma Ard B, MD  QUEtiapine (SEROQUEL) 300 MG tablet Take 2 tablets (600 mg total) by mouth at bedtime. Patient not taking: Reported on 10/08/2017 05/28/17   Pucilowska, Herma Ard B, MD  QUEtiapine (SEROQUEL) 50 MG tablet Take 1 tablet (50 mg total) by mouth 3 (three) times daily. Patient not taking: Reported on 10/08/2017 05/28/17   Clovis Fredrickson, MD    No Known Allergies  Family History  Problem Relation Age of Onset  . Diabetes Mother   . Hypertension Mother   . Drug abuse Father   . Schizophrenia Maternal Aunt     Social History Social History  Substance Use Topics  . Smoking status: Current Every Day Smoker    Packs/day: 0.50    Types: Cigarettes  . Smokeless tobacco: Never Used  Comment: pt reported quiting three weeks ago  . Alcohol use Yes     Comment: occasional     Review of Systems  Constitutional: Negative for fever. Eyes: Negative for visual changes. ENT: Negative for sore throat. Cardiovascular: Negative for chest pain. Respiratory: Negative for shortness of breath. Gastrointestinal: Negative for  abdominal pain, vomiting and diarrhea. Genitourinary: Negative for dysuria. Musculoskeletal: Negative for back pain. Skin: Negative for rash. Neurological: Negative for headache.  ____________________________________________   PHYSICAL EXAM:  VITAL SIGNS: ED Triage Vitals [10/08/17 0932]  Enc Vitals Group     BP (!) 132/98     Pulse Rate 87     Resp      Temp 97.8 F (36.6 C)     Temp Source Oral     SpO2 100 %     Weight 140 lb (63.5 kg)     Height 5\' 2"  (1.575 m)     Head Circumference      Peak Flow      Pain Score      Pain Loc      Pain Edu?      Excl. in [place]?      Constitutional: Alert and oriented. Well appearing and in no distress. HEENT   Head: Normocephalic and atraumatic.      Eyes: Conjunctivae are normal. Pupils equal and round.       Ears:         Nose: No congestion/rhinnorhea.   Mouth/Throat: Mucous membranes are moist.   Neck: No stridor. Cardiovascular/Chest: Normal rate, regular rhythm.  No murmurs, rubs, or gallops. Respiratory: Normal respiratory effort without tachypnea nor retractions. Breath sounds are clear and equal bilaterally. No wheezes/rales/rhonchi. Gastrointestinal: Soft. No distention, no guarding, no rebound. Nontender.    Genitourinary/rectal:Deferred Musculoskeletal: Nontender with normal range of motion in all extremities.  Neurologic:  Normal speech and language. No gross or focal neurologic deficits are appreciated. Skin:  Skin is warm, dry and intact. No rash noted. Psychiatric: somewhat pressured speech and agitated when I ask her questions.  she is reporting to me depressed mood and vague suicidal ideation.   ____________________________________________  LABS (pertinent positives/negatives) I, [name], MD the attending physician have reviewed the labs noted below.  Labs Reviewed  COMPREHENSIVE METABOLIC PANEL - Abnormal; Notable for the following:       Result Value   Glucose, Bld 102 (*)    Calcium 8.7  (*)    ALT 11 (*)    All other components within normal limits  CBC WITH DIFFERENTIAL/PLATELET - Abnormal; Notable for the following:    RBC 3.69 (*)    Hemoglobin 9.8 (*)    HCT 30.2 (*)    RDW 20.7 (*)    All other components within normal limits  ACETAMINOPHEN LEVEL - Abnormal; Notable for the following:    Acetaminophen (Tylenol), Serum <10 (*)    All other components within normal limits  ETHANOL  SALICYLATE LEVEL  URINE DRUG SCREEN, QUALITATIVE ([facility] ONLY)  URINALYSIS, COMPLETE (UACMP) WITH MICROSCOPIC  POC URINE PREG, ED    ____________________________________________    EKG I, [name], MD, the attending physician have personally viewed and interpreted all ECGs.  none ____________________________________________  RADIOLOGY All Xrays were viewed by me.  Imaging interpreted by Radiologist, and I, [name], MD the attending physician have reviewed the radiologist interpretation noted below.  none __________________________________________  PROCEDURES  Procedure(s) performed: None  Critical Care performed: None  ____________________________________________  No current facility-administered medications on file  prior to encounter.    Current Outpatient Prescriptions on File Prior to Encounter  Medication Sig Dispense Refill  . divalproex (DEPAKOTE) 250 MG DR tablet Take 3 tablets (750 mg total) by mouth every 12 (twelve) hours. (Patient not taking: Reported on 10/08/2017) 180 tablet 1  . gabapentin (NEURONTIN) 600 MG tablet Take 1 tablet (600 mg total) by mouth 3 (three) times daily. (Patient not taking: Reported on 10/08/2017) 90 tablet 1  . hydrOXYzine (ATARAX/VISTARIL) 50 MG tablet Take 1 tablet (50 mg total) by mouth 3 (three) times daily as needed for anxiety. (Patient not taking: Reported on 10/08/2017) 90 tablet 1  . QUEtiapine (SEROQUEL) 300 MG tablet Take 2 tablets (600 mg total) by mouth at bedtime. (Patient not taking: Reported on 10/08/2017) 60  tablet 1  . QUEtiapine (SEROQUEL) 50 MG tablet Take 1 tablet (50 mg total) by mouth 3 (three) times daily. (Patient not taking: Reported on 10/08/2017) 90 tablet 1    ____________________________________________  ED COURSE / ASSESSMENT AND PLAN  Pertinent labs & imaging results that were available during my care of the patient were reviewed by me and considered in my medical decision making (see chart for details).   Ms. Dancel presents with a multitude of psychiatric diagnoses chart history, patient tells me just "depression" and states that she's been off of medications but is not sure what they are. Chart history indicates Seroquel and Depakote. Next  Patient reports frustrations with current living situation and wants to move to [place] but that has not happened for at least another week.  She is reporting vague suicidal ideation but is very cooperative forvoluntary for psychiatric evaluation and his interested in hospitalization as well as getting back on medications.  patient recommended for inpatient admission and the patient remains cooperative and voluntary. Patient will be [facility] to the [facility] for evaluation. If patient would change her mind, might consider IVC unless reevaluated.  For now I believe her voluntary as she is agreeable for admission at which is the reason she came.  CONSULTATIONS:  specialist on-call tele-psychiatry as well as TTS. psychiatrist recommended inpatient referral for admission.   Patient / Family / Caregiver informed of clinical course, medical decision-making process, and agree with plan.  Addended to include I received a fax report which indicates patient is recommended for involuntary commitment and I did complete the paperwork to that effect. ___________________________________________   FINAL CLINICAL IMPRESSION(S) / ED DIAGNOSES   Final diagnoses:  Suicidal ideation  Polysubstance abuse ([place])  Depression, unspecified depression type               Note: This dictation was prepared with Dragon dictation. Any transcriptional errors that result from this process are unintentional    [name], MD 10/08/17 1306    [name], MD 10/08/17 1309

## 2017-10-08 NOTE — BH Assessment (Signed)
Assessment Note  Deborah Melendez is an 41 y.o. female who presents to the ER seeking help with depression. Patient reports of having thoughts of ending her life by overdosing on medications. She reports of having two previous attempts and the most serious attempt is when she cut her wrist. Patient also reports, her current stressor is her upcoming move. She states she is moving to [place]. She also admits to abusing cocaine and cannabis. For the last several months, she has used two to three times a week.  During the interview, the patient was calm, cooperative and pleasant. She was able to provide appropriate answers to the questions. She denies AV/H and HI. She also denies involvement with the legal system and have no history of aggression and violence.  Diagnosis: Depression  Past Medical History:  Past Medical History:  Diagnosis Date  . MRSA (methicillin resistant Staphylococcus aureus)    surgery on finger 3 years ago  . Substance or medication-induced bipolar and related disorder with onset during intoxication ([place]) 09/08/2016    Past Surgical History:  Procedure Laterality Date  . FINGER SURGERY      Family History:  Family History  Problem Relation Age of Onset  . Diabetes Mother   . Hypertension Mother   . Drug abuse Father   . Schizophrenia Maternal Aunt     Social History:  reports that she has been smoking Cigarettes.  She has been smoking about 0.50 packs per day. She has never used smokeless tobacco. She reports that she drinks alcohol. She reports that she uses drugs, including Cocaine.  Additional Social History:  Alcohol / Drug Use Pain Medications: See PTA Prescriptions: See PTA Over the Counter: See PTA History of alcohol / drug use?: Yes Longest period of sobriety (when/how long): Unable to quantify  Negative Consequences of Use:  (Reports of none) Withdrawal Symptoms:  (Reports of none) Substance #1 Name of Substance 1: Cocaine 1 - Age of First Use:  22 1 - Amount (size/oz): Unable to quantify  1 - Frequency: "two times a week." 1 - Duration: "Three months" 1 - Last Use / Amount: 10/06/2017 Substance #2 Name of Substance 2: Cannabis 2 - Age of First Use: 15 2 - Amount (size/oz): Unable to quantify  2 - Frequency: "Two or three times a week" 2 - Duration: "For about four months" 2 - Last Use / Amount: 10/06/2017  CIWA: CIWA-Ar BP: (!) 132/98 Pulse Rate: 87 COWS:    Allergies: No Known Allergies  Home Medications:  (Not in a hospital admission)  OB/GYN Status:  No LMP recorded.  General Assessment Data Assessment unable to be completed: Yes Location of Assessment: [facility] ED TTS Assessment: In system Is this a Tele or Face-to-Face Assessment?: Face-to-Face Is this an Initial Assessment or a Re-assessment for this encounter?: Initial Assessment Marital status: Single Maiden name: n/a Is patient pregnant?: No Pregnancy Status: No Living Arrangements: Alone Can pt return to current living arrangement?: Yes Admission Status: Voluntary Is patient capable of signing voluntary admission?: Yes Referral Source: Self/Family/Friend Insurance type: None  Medical Screening Exam ([place]) Medical Exam completed: Yes  Crisis Care Plan Living Arrangements: Alone Legal Guardian: Other: (Self) Name of Psychiatrist: Reports of none Name of Therapist: Reports of none  Education Status Is patient currently in school?: No Current Grade: n/a Highest grade of school patient has completed: [employer] Diploma Name of school: n/a Contact person: n/a  Risk to self with the past 6 months Suicidal Ideation: Yes-Currently Present  Has patient been a risk to self within the past 6 months prior to admission? : Yes Suicidal Intent: Yes-Currently Present Has patient had any suicidal intent within the past 6 months prior to admission? : Yes Is patient at risk for suicide?: Yes Suicidal Plan?: Yes-Currently Present Has patient had  any suicidal plan within the past 6 months prior to admission? : Yes Specify Current Suicidal Plan: Overdose Access to Means: Yes Specify Access to Suicidal Means: Medications What has been your use of drugs/alcohol within the last 12 months?: Cocaine & Cannabis Previous Attempts/Gestures: No How many times?: 2 Other Self Harm Risks: Active addiction Triggers for Past Attempts: Other (Comment) Intentional Self Injurious Behavior: None Family Suicide History: No Recent stressful life event(s): Conflict (Comment), Other (Comment) Persecutory voices/beliefs?: No Depression: Yes Depression Symptoms: Tearfulness, Isolating, Fatigue Substance abuse history and/or treatment for substance abuse?: Yes Suicide prevention information given to non-admitted patients: Not applicable  Risk to Others within the past 6 months Homicidal Ideation: No Does patient have any lifetime risk of violence toward others beyond the six months prior to admission? : No Thoughts of Harm to Others: No Current Homicidal Intent: No Current Homicidal Plan: No Access to Homicidal Means: No Identified Victim: Reports of none History of harm to others?: No Assessment of Violence: None Noted Violent Behavior Description: Reports of none Does patient have access to weapons?: No Criminal Charges Pending?: No Does patient have a court date: No Is patient on probation?: No  Psychosis Hallucinations: None noted Delusions: None noted  Mental Status Report Appearance/Hygiene: Unremarkable, In scrubs Eye Contact: Fair Motor Activity: Freedom of movement, Unremarkable Speech: Logical/coherent, Unremarkable Level of Consciousness: Drowsy Mood: Depressed, Sad, Helpless, Pleasant Affect: Appropriate to circumstance, Depressed Anxiety Level: Minimal Thought Processes: Coherent, Relevant Judgement: Unimpaired Orientation: Person, Place, Time, Situation, Appropriate for developmental age Obsessive Compulsive  Thoughts/Behaviors: Minimal  Cognitive Functioning Concentration: Normal Memory: Recent Intact, Remote Intact IQ: Average Insight: Fair Impulse Control: Fair Appetite: Good Weight Loss: 0 Weight Gain: 0 Sleep: No Change Total Hours of Sleep: 6 Vegetative Symptoms: None  ADLScreening [facility] Assessment Services) Patient's cognitive ability adequate to safely complete daily activities?: Yes Patient able to express need for assistance with ADLs?: Yes Independently performs ADLs?: Yes (appropriate for developmental age)  Prior Inpatient Therapy Prior Inpatient Therapy: Yes Prior Therapy Dates: 04/2017, 02/2017, 01/2017, 12/2016, 08/2016, 07/2016 & 07/2015 Prior Therapy Facilty/Provider(s): [facility], [facility] & [facility] Reason for Treatment: Depression & Substance Abuse  Prior Outpatient Therapy Prior Outpatient Therapy: No Prior Therapy Dates: Reports of none Prior Therapy Facilty/Provider(s): Reports of none Reason for Treatment: Reports of none Does patient have an ACCT team?: No Does patient have Intensive In-House Services?  : No Does patient have [facility] services? : No Does patient have P4CC services?: No  ADL Screening (condition at time of admission) Patient's cognitive ability adequate to safely complete daily activities?: Yes Is the patient deaf or have difficulty hearing?: No Does the patient have difficulty seeing, even when wearing glasses/contacts?: No Does the patient have difficulty concentrating, remembering, or making decisions?: No Patient able to express need for assistance with ADLs?: Yes Does the patient have difficulty dressing or bathing?: No Independently performs ADLs?: Yes (appropriate for developmental age) Does the patient have difficulty walking or climbing stairs?: No Weakness of Legs: None Weakness of Arms/Hands: None  Home Assistive Devices/Equipment Home Assistive Devices/Equipment: None  Therapy Consults (therapy consults require a physician  order) PT Evaluation Needed: No OT Evalulation Needed: No SLP Evaluation  Needed: No Abuse/Neglect Assessment (Assessment to be complete while patient is alone) Physical Abuse: Denies Verbal Abuse: Denies Sexual Abuse: Denies Exploitation of patient/patient's resources: Denies Self-Neglect: Denies Values / Beliefs Cultural Requests During Hospitalization: None Spiritual Requests During Hospitalization: None Consults Spiritual Care Consult Needed: No Social Work Consult Needed: No Regulatory affairs officer (For Healthcare) Does Patient Have a Medical Advance Directive?: No    Additional Information 1:1 In Past 12 Months?: No CIRT Risk: No Elopement Risk: No Does patient have medical clearance?: Yes  Child/Adolescent Assessment Running Away Risk: Denies (Patient is an adult)  Disposition:  Disposition Initial Assessment Completed for this Encounter: Yes Disposition of Patient: Other dispositions (ER MD Ordered Psych Consult)  On Site Evaluation by:   Reviewed with Physician:    Gunnar Fusi MS, LCAS, [place], [place], CCSI Therapeutic Triage Specialist 10/08/2017 12:26 PM

## 2017-10-08 NOTE — ED Notes (Signed)
Patient is alert and oriented, poor eye contact, Patient was in a hurry to talk, states " I want to just sleep, states that she is still having thoughts of harming herself without a plan, states she has a good support system, also she states she wants to get off of drugs and alcohol. Patient with q 15 minute checks and camera surveillance in progress for safety.

## 2017-10-08 NOTE — ED Notes (Signed)
Pt given breakfast. Triage nurse unable to get blood. Pt agrees to letting me draw blood after eating.

## 2017-10-08 NOTE — ED Notes (Signed)
Bernice  [phone]

## 2017-10-08 NOTE — ED Notes (Signed)
Attempted to draw blood twice. Pt refused to have any further attempts until a later time.

## 2017-10-08 NOTE — ED Notes (Signed)
Calvin at bedside talking to pt

## 2017-10-08 NOTE — Tx Team (Signed)
Initial Treatment Plan 10/08/2017 11:28 PM Deborah Melendez PQD:[id number]    PATIENT STRESSORS: Financial difficulties Substance abuse   PATIENT STRENGTHS: Capable of independent living Armed forces logistics/support/administrative officer Motivation for treatment/growth   PATIENT IDENTIFIED PROBLEMS: Depression    Substance use    Lack compliance with prescribed medicines              DISCHARGE CRITERIA:  Improved stabilization in mood, thinking, and/or behavior Verbal commitment to aftercare and medication compliance  PRELIMINARY DISCHARGE PLAN: Attend aftercare/continuing care group Participate in family therapy  PATIENT/FAMILY INVOLVEMENT: This treatment plan has been presented to and reviewed with the patient, Deborah Melendez,  The patient have been given the opportunity to ask questions and make suggestions.  Clemens Catholic, RN 10/08/2017, 11:28 PM

## 2017-10-08 NOTE — ED Notes (Signed)
Pt eating meal tray 

## 2017-10-08 NOTE — ED Notes (Signed)
Patient ate 100% of supper, patient is alert and oriented, no signs of distress, Patient does have a congested cough, nurse to administer mucinex. Patient is pleasant and cooperative. q 15 minute checks and camera surveillance in progress for safety.

## 2017-10-08 NOTE — ED Notes (Signed)
POCT UA Preg = NEG

## 2017-10-09 DIAGNOSIS — F3181 Bipolar II disorder: Secondary | ICD-10-CM

## 2017-10-09 LAB — LIPID PANEL
CHOL/HDL RATIO: 2.6 ratio
Cholesterol: 135 mg/dL (ref 0–200)
HDL: 52 mg/dL (ref >40)
LDL CALC: 69 mg/dL (ref 0–99)
TRIGLYCERIDES: 69 mg/dL (ref <150)
VLDL: 14 mg/dL (ref 0–40)

## 2017-10-09 LAB — TSH: TSH: 0.286 u[IU]/mL — ABNORMAL LOW (ref 0.350–4.500)

## 2017-10-09 NOTE — Plan of Care (Signed)
Problem: Education: Goal: Knowledge of disease or condition will improve Outcome: Progressing Patient to attend groups   Problem: Health Behavior/Discharge Planning: Goal: Identification of resources available to assist in meeting health care needs will improve Outcome: Progressing Social work to speak with patient

## 2017-10-09 NOTE — Progress Notes (Signed)
Patient presents as needy and anxious, coming to the nursing station several times for different things. She is distracted easily. She does complain of a cough see MAR medication given. She asks for clothes, for a hair tie. She is med compliant and received her flu shot. She is pleasant. No signs of distress noted.

## 2017-10-09 NOTE — Plan of Care (Signed)
Problem: Coping: Goal: Ability to verbalize frustrations and anger appropriately will improve Outcome: Progressing Pt will verbalize improvement of frustrations and anger this shift.
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
## 2017-10-09 NOTE — Progress Notes (Signed)
Patient came to the unit last night, attended by security staff and health care team, patient appeared tired and depressed, food and fluids were offered and patient declined stating that I'm not hungry I just want to go to sleep, skin assessment is done  and complete by two nurses, search completed no contraband found at this time, patient took her medications well without difficulties . Patient remain alert and responsive vital signes stable and no skin issues , some acne scars around face and back of the body, patient is calm and denies and thoughts of harm to self and other and contracted safety with care team no distress noted at this time.
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
## 2017-10-09 NOTE — Progress Notes (Signed)
Pt is in a depressed mood. Pt stayed in room most of shift except to take meds and get snack. Pt has minimal interactions with staff and peers. Pt is med compliant. Denies SI, HI or A/V hallucinations. Contracted to safety. Pt had c/o of headache 6/10, Tylenol 650 mg tab po given, positive effect noted, no longer experiencing pain. 15 min safety checks continues.

## 2017-10-09 NOTE — [facility] Group Notes (Signed)
[facility] Group Notes:  (Nursing/MHT/Case Management/Adjunct)  Date:  10/09/2017  Time:  9:47 PM  Type of Therapy:  Group Therapy  Participation Level:  Active  Participation Quality:  Supportive  Affect:  Appropriate  Cognitive:  Alert  Insight:  Good  Engagement in Group:  Engaged  Modes of Intervention:  Support  Summary of Progress/Problems:  Deborah Melendez 10/09/2017, 9:47 PM

## 2017-10-10 LAB — HEMOGLOBIN A1C
Hgb A1c MFr Bld: 5.6 % (ref 4.8–5.6)
Mean Plasma Glucose: 114.02 mg/dL

## 2017-10-10 MED ORDER — GUAIFENESIN ER 600 MG PO TB12
600.0000 mg | ORAL_TABLET | Freq: Three times a day (TID) | ORAL | Status: DC | PRN
  Administered 2017-10-10 – 2017-10-15 (×8): 600 mg via ORAL
  Filled 2017-10-10 (×7): qty 1

## 2017-10-10 NOTE — [facility] Group Notes (Signed)

## 2017-10-10 NOTE — Plan of Care (Signed)
Problem: Education: Goal: Understanding of discharge needs will improve Outcome: Progressing Deborah Melendez is making plans for her discharge. She has been making and receiving calls throughout the day.  Problem: Safety: Goal: Ability to remain free from injury will improve Deborah Melendez  Feels safe here on the unit.

## 2017-10-10 NOTE — Plan of Care (Signed)
Problem: Safety: Goal: Ability to remain free from injury will improve Outcome: Progressing Pt will remain injury free the entire shift.

## 2017-10-10 NOTE — Progress Notes (Signed)
Received Deborah Melendez this am in her room after breakfast. She was compliant with her medications. She has been OOB at intervals in the milieu for briefs periods of time. She has been taking her meals in the dinning room. She endorses feeling depressed.
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
## 2017-10-10 NOTE — [facility] Group Notes (Signed)
[facility] Group Notes:  (Nursing/MHT/Case Management/Adjunct)  Date:  10/10/2017  Time:  11:19 PM  Type of Therapy:  Psychoeducational Skills  Participation Level:  Did Not Attend  Summary of Progress/Problems:  Deborah Melendez 10/10/2017, 11:19 PM

## 2017-10-11 LAB — IRON AND TIBC
Iron: 14 ug/dL — ABNORMAL LOW (ref 28–170)
Saturation Ratios: 4 % — ABNORMAL LOW (ref 10.4–31.8)
TIBC: 355 ug/dL (ref 250–450)
UIBC: 341 ug/dL

## 2017-10-11 LAB — T4, FREE: Free T4: 0.7 ng/dL (ref 0.61–1.12)

## 2017-10-11 MED ORDER — SULFAMETHOXAZOLE-TRIMETHOPRIM 800-160 MG PO TABS
1.0000 | ORAL_TABLET | Freq: Two times a day (BID) | ORAL | Status: DC
  Administered 2017-10-11 – 2017-10-15 (×8): 1 via ORAL
  Filled 2017-10-11 (×9): qty 1

## 2017-10-11 NOTE — [facility] Group Notes (Signed)
LCSW Group Therapy Note   10/11/2017 9:30am   Type of Therapy and Topic:  Group Therapy:  Overcoming Obstacles   Participation Level:  Did Not Attend   Description of Group:    In this group patients will be encouraged to explore what they see as obstacles to their own wellness and recovery. They will be guided to discuss their thoughts, feelings, and behaviors related to these obstacles. The group will process together ways to cope with barriers, with attention given to specific choices patients can make. Each patient will be challenged to identify changes they are motivated to make in order to overcome their obstacles. This group will be process-oriented, with patients participating in exploration of their own experiences as well as giving and receiving support and challenge from other group members.   Therapeutic Goals: 1. Patient will identify personal and current obstacles as they relate to admission. 2. Patient will identify barriers that currently interfere with their wellness or overcoming obstacles.  3. Patient will identify feelings, thought process and behaviors related to these barriers. 4. Patient will identify two changes they are willing to make to overcome these obstacles:      Summary of Patient Progress      Therapeutic Modalities:   Cognitive Behavioral Therapy Solution Focused Therapy Motivational Interviewing Relapse Prevention Therapy  August Saucer, LCSW 10/11/2017 5:19 PM

## 2017-10-11 NOTE — [facility] Group Notes (Signed)
[facility] Group Notes:  (Nursing/MHT/Case Management/Adjunct)  Date:  10/11/2017  Time:  11:12 PM  Type of Therapy:  Group Therapy  Participation Level:  Active  Participation Quality:  Appropriate  Affect:  Appropriate  Cognitive:  Alert  Insight:  Appropriate  Engagement in Group:  Engaged  Modes of Intervention:  Discussion  Summary of Progress/Problems:  Deborah Melendez 10/11/2017, 11:12 PM

## 2017-10-11 NOTE — Tx Team (Signed)
Interdisciplinary Treatment and Diagnostic Plan Update  10/11/2017 Time of Session: [facility] MRN: [id number]  Principal Diagnosis: <principal problem not specified>  Secondary Diagnoses: Active Problems:   MDD (major depressive disorder), recurrent severe, without psychosis ([place])   Cocaine use disorder, severe, dependence ([place])   Cannabis use disorder, severe, dependence ([place])   Suicidal ideations   Bipolar 2 disorder, major depressive episode ([place])   Current Medications:  Current Facility-Administered Medications  Medication Dose Route Frequency Provider Last Rate Last Dose  . acetaminophen (TYLENOL) tablet 650 mg  650 mg Oral Q6H PRN Pucilowska, Jolanta B, MD   650 mg at 10/10/17 0952  . alum & mag hydroxide-simeth (MAALOX/MYLANTA) 200-200-20 MG/5ML suspension 30 mL  30 mL Oral Q4H PRN Pucilowska, Jolanta B, MD      . divalproex (DEPAKOTE) DR tablet 750 mg  750 mg Oral Q12H Pucilowska, Jolanta B, MD   750 mg at 10/11/17 0917  . gabapentin (NEURONTIN) tablet 600 mg  600 mg Oral TID Pucilowska, Jolanta B, MD   600 mg at 10/11/17 0916  . guaiFENesin (MUCINEX) 12 hr tablet 600 mg  600 mg Oral Q8H PRN Ramond Dial, MD   600 mg at 10/11/17 0526  . hydrOXYzine (ATARAX/VISTARIL) tablet 50 mg  50 mg Oral TID PRN Pucilowska, Jolanta B, MD   50 mg at 10/10/17 2001  . magnesium hydroxide (MILK OF MAGNESIA) suspension 30 mL  30 mL Oral Daily PRN Pucilowska, Jolanta B, MD      . nicotine (NICODERM CQ - dosed in mg/24 hours) patch 21 mg  21 mg Transdermal Q0600 Pucilowska, Jolanta B, MD   21 mg at 10/11/17 0917  . QUEtiapine (SEROQUEL) tablet 50 mg  50 mg Oral TID Pucilowska, Jolanta B, MD   50 mg at 10/11/17 0916  . QUEtiapine (SEROQUEL) tablet 600 mg  600 mg Oral QHS Pucilowska, Jolanta B, MD   600 mg at 10/10/17 2119   PTA Medications: Prescriptions Prior to Admission  Medication Sig Dispense Refill Last Dose  . divalproex (DEPAKOTE) 250 MG DR tablet Take 3 tablets (750 mg  total) by mouth every 12 (twelve) hours. (Patient not taking: Reported on 10/08/2017) 180 tablet 1 Not Taking at Unknown time  . gabapentin (NEURONTIN) 600 MG tablet Take 1 tablet (600 mg total) by mouth 3 (three) times daily. (Patient not taking: Reported on 10/08/2017) 90 tablet 1 Not Taking at Unknown time  . hydrOXYzine (ATARAX/VISTARIL) 50 MG tablet Take 1 tablet (50 mg total) by mouth 3 (three) times daily as needed for anxiety. (Patient not taking: Reported on 10/08/2017) 90 tablet 1 Not Taking at Unknown time  . QUEtiapine (SEROQUEL) 300 MG tablet Take 2 tablets (600 mg total) by mouth at bedtime. (Patient not taking: Reported on 10/08/2017) 60 tablet 1 Not Taking at Unknown time  . QUEtiapine (SEROQUEL) 50 MG tablet Take 1 tablet (50 mg total) by mouth 3 (three) times daily. 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   Physician Treatment Plan for Primary Diagnosis: <principal problem not specified> Long Term Goal(s): Improvement in symptoms so as ready for discharge   Short Term Goals: Ability to verbalize feelings will improve Ability to disclose and discuss suicidal ideas Ability to identify and  develop effective coping behaviors will improve Ability to identify triggers associated with substance abuse/mental health issues will improve  Medication Management: Evaluate patient's response, side effects, and tolerance of medication regimen.  Therapeutic Interventions: 1 to 1 sessions, Unit Group sessions and Medication administration.  Evaluation of Outcomes: Progressing  Physician Treatment Plan for Secondary Diagnosis: Active  Problems:   MDD (major depressive disorder), recurrent severe, without psychosis ([place])   Cocaine use disorder, severe, dependence ([facility])   Cannabis use disorder, severe, dependence ([place])   Suicidal ideations   Bipolar 2 disorder, major depressive episode ([place])  Long Term Goal(s): Improvement in symptoms so as ready for discharge   Short Term Goals: Ability to verbalize feelings will improve Ability to disclose and discuss suicidal ideas Ability to identify and develop effective coping behaviors will improve Ability to identify triggers associated with substance abuse/mental health issues will improve     Medication Management: Evaluate patient's response, side effects, and tolerance of medication regimen.  Therapeutic Interventions: 1 to 1 sessions, Unit Group sessions and Medication administration.  Evaluation of Outcomes: Progressing   RN Treatment Plan for Primary Diagnosis: <principal problem not specified> Long Term Goal(s): Knowledge of disease and therapeutic regimen to maintain health will improve  Short Term Goals: Ability to verbalize feelings will improve, Ability to identify and develop effective coping behaviors will improve and Compliance with prescribed medications will improve  Medication Management: RN will administer medications as ordered by provider, will assess and evaluate patient's response and provide education to patient for prescribed medication. RN will report any adverse and/or side effects to prescribing provider.  Therapeutic Interventions: 1 on 1 counseling sessions, Psychoeducation, Medication administration, Evaluate responses to treatment, Monitor vital signs and CBGs as ordered, Perform/monitor CIWA, COWS, AIMS and Fall Risk screenings as ordered, Perform wound care treatments as ordered.  Evaluation of Outcomes: Progressing   LCSW Treatment Plan for Primary Diagnosis: <principal problem not specified> Long Term Goal(s): Safe transition to  appropriate next level of care at discharge, Engage patient in therapeutic group addressing interpersonal concerns.  Short Term Goals: Engage patient in aftercare planning with referrals and resources and Increase skills for wellness and recovery  Therapeutic Interventions: Assess for all discharge needs, 1 to 1 time with Social worker, Explore available resources and support systems, Assess for adequacy in community support network, Educate family and significant other(s) on suicide prevention, Complete Psychosocial Assessment, Interpersonal group therapy.  Evaluation of Outcomes: Progressing   Progress in Treatment: Attending groups: No. Participating in groups: No. Taking medication as prescribed: Yes. Toleration medication: Yes. Family/Significant other contact made: Yes, individual(s) contacted:  friend Patient understands diagnosis: Yes. Discussing patient identified problems/goals with staff: Yes. Medical problems stabilized or resolved: Yes. Denies suicidal/homicidal ideation: Yes. Issues/concerns per patient self-inventory: No. Other: none   New problem(s) identified: No, Describe:  none  New Short Term/Long Term Goal(s):  Discharge Plan or Barriers:   Reason for Continuation of Hospitalization: Anxiety Depression Medication stabilization  Estimated Length of Stay:3-5 days  Attendees: Patient:Deborah Melendez 10/11/2017   Physician: Dr. Bary Leriche, MD 10/11/2017   Nursing: Tyler Pita, RN 10/11/2017   RN Care Manager: 10/11/2017   Social Worker: Lurline Idol, LCSW 10/11/2017   Recreational Therapist:  10/11/2017   Other:  10/11/2017   Other:  10/11/2017   Other: 10/11/2017        Scribe for Treatment Team: [name], [place] 10/11/2017 11:49 AM

## 2017-10-11 NOTE — [facility] Group Notes (Signed)
[facility] Group Notes:  (Nursing/MHT/Case Management/Adjunct)  Date:  10/11/2017  Time:  2:29 PM  Type of Therapy:  Psychoeducational Skills  Participation Level:  Did Not Attend  Adela Lank [facility] 10/11/2017, 2:29 PM
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
## 2017-10-11 NOTE — Plan of Care (Signed)
Problem: Safety: Goal: Ability to remain free from injury will improve Outcome: Progressing Safety maintained in the unit.  Problem: Activity: Goal: Interest or engagement in activities will improve Outcome: Progressing Attended group activities.  Problem: Coping: Goal: Ability to verbalize frustrations and anger appropriately will improve Outcome: Progressing Verbalized passive suicidal thoughts,contracts for safety

## 2017-10-11 NOTE — Progress Notes (Signed)
Patient was irritable his morning.Later appropriate with staff & peers.Denies SI,HI and AVH.Compliant with medications.Attended groups.Appetite and energy level good.Support and encouragement given.

## 2017-10-12 LAB — CHLAMYDIA/NGC RT PCR (ARMC ONLY)
CHLAMYDIA TR: DETECTED — AB
N GONORRHOEAE: NOT DETECTED

## 2017-10-12 MED ORDER — FERROUS SULFATE 325 (65 FE) MG PO TABS
325.0000 mg | ORAL_TABLET | Freq: Three times a day (TID) | ORAL | Status: DC
  Administered 2017-10-12 – 2017-10-15 (×10): 325 mg via ORAL
  Filled 2017-10-12 (×10): qty 1

## 2017-10-12 MED ORDER — SERTRALINE HCL 25 MG PO TABS
50.0000 mg | ORAL_TABLET | Freq: Every day | ORAL | Status: DC
  Administered 2017-10-12 – 2017-10-15 (×4): 50 mg via ORAL
  Filled 2017-10-12 (×4): qty 2

## 2017-10-12 NOTE — Progress Notes (Signed)
Patient  Remained in bed all shift . Affect irritable  Patient very needy D: Patient stated slept poor last night .Stated appetite is good and energy level  low Stated concentrationpoor . Stated on Depression scale 8  , hopeless 8 and anxiety .( low 0-10 high) Denies suicidal  homicidal ideations  .  No auditory hallucinations  No pain concerns . Appropriate ADL'S. Interacting with peers and staff.  A: Encourage patient participation with unit programming . Instruction  Given on  Medication , verbalize understanding. R: Voice no other concerns. Staff continue to monitor

## 2017-10-12 NOTE — Progress Notes (Signed)
Pt has been rather pleasant. Pt interacting appropriately with staff and peers. Pt did attend wrap up group. Denies SI, HI or A/V hallucinations. Contracted to safety. Denies pain or discomfort. No distress noted. Med compliant, no adverse affects noted. 15 min safety checks continues.

## 2017-10-12 NOTE — Plan of Care (Signed)
Problem: Safety: Goal: Ability to remain free from injury will improve Outcome: Progressing Pt will remain injury free the entire shift.

## 2017-10-12 NOTE — Plan of Care (Signed)
Problem: Education: Goal: Understanding of discharge needs will improve Outcome: Progressing Patient understands the disease processes and medical regimen  Problem: Safety: Goal: Ability to remain free from injury will improve Outcome: Progressing Patient is safe and secure from harm to self and others

## 2017-10-12 NOTE — [facility] Group Notes (Signed)
LCSW Group Therapy Note  10/12/2017 9:30am  Type of Therapy/Topic:  Group Therapy:  Feelings about Diagnosis  Participation Level:  Did Not Attend   Description of Group:   This group will allow patients to explore their thoughts and feelings about diagnoses they have received. Patients will be guided to explore their level of understanding and acceptance of these diagnoses. Facilitator will encourage patients to process their thoughts and feelings about the reactions of others to their diagnosis and will guide patients in identifying ways to discuss their diagnosis with significant others in their lives. This group will be process-oriented, with patients participating in exploration of their own experiences, giving and receiving support, and processing challenge from other group members.   Therapeutic Goals: 1. Patient will demonstrate understanding of diagnosis as evidenced by identifying two or more symptoms of the disorder 2. Patient will be able to express two feelings regarding the diagnosis 3. Patient will demonstrate their ability to communicate their needs through discussion and/or role play  Summary of Patient Progress:       Therapeutic Modalities:   Cognitive Behavioral Therapy Brief Therapy Feelings Identification    August Saucer, LCSW 10/12/2017 10:36 AM
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
## 2017-10-12 NOTE — BHH Group Notes (Signed)
[facility] Group Notes:  (Nursing/MHT/Case Management/Adjunct)  Date:  10/12/2017  Time:  6:25 PM  Type of Therapy:  Psychoeducational Skills  Participation Level:  Did Not Attend  Deborah Melendez 10/12/2017, 6:25 PM

## 2017-10-13 LAB — VALPROIC ACID LEVEL: VALPROIC ACID LVL: 66 ug/mL (ref 50.0–100.0)

## 2017-10-13 MED ORDER — RISAQUAD PO CAPS
2.0000 | ORAL_CAPSULE | Freq: Every day | ORAL | Status: DC
  Administered 2017-10-13 – 2017-10-15 (×3): 2 via ORAL
  Filled 2017-10-13 (×4): qty 2

## 2017-10-13 MED ORDER — AZITHROMYCIN 500 MG PO TABS
1000.0000 mg | ORAL_TABLET | Freq: Once | ORAL | Status: AC
  Administered 2017-10-13: 1000 mg via ORAL
  Filled 2017-10-13 (×2): qty 2

## 2017-10-13 NOTE — BHH Group Notes (Signed)
[place] Group Notes:  (Nursing/MHT/Case Management/Adjunct)  Date:  10/13/2017  Time:  3:25 PM  Type of Therapy:  Psychoeducational Skills  Participation Level:  Active  Participation Quality:  Appropriate and Attentive  Affect:  Appropriate  Cognitive:  Alert  Insight:  Appropriate and Good  Engagement in Group:  Engaged  Modes of Intervention:  Discussion and Exploration  Summary of Progress/Problems:  Deborah Melendez 10/13/2017, 3:25 PM
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
## 2017-10-13 NOTE — BHH Group Notes (Signed)
[facility] Group Notes:  (Nursing/MHT/Case Management/Adjunct)  Date:  10/13/2017  Time:  12:29 AM  Type of Therapy:  Wrap-Up Group  Participation Level:  Active  Participation Quality:  Appropriate  Affect:  Appropriate  Cognitive:  Appropriate  Insight:  Appropriate  Engagement in Group:  Engaged  Modes of Intervention:  Discussion, Socialization and Support  Summary of Progress/Problems:  Deborah Melendez 10/13/2017, 12:29 AM

## 2017-10-13 NOTE — Progress Notes (Signed)
CSW spoke with pt about her new discharge plan. Pt reports her friend Kittie Plater will pick her up and he might take her to [place] or she might stay with him in [place]. CSW dicussed need to arrange follow up appt--she asked SW to set this up in [place] at this time.   Winferd Humphrey, MSW, LCSW Clinical Social Worker 10/13/2017 2:54 PM

## 2017-10-13 NOTE — BHH Group Notes (Signed)
  [place] LCSW Group Therapy Note  Date/Time: 10/13/17, 0930  Type of Therapy/Topic:  Group Therapy:  Emotion Regulation  Participation Level:  Did Not Attend   Mood:  Description of Group:    The purpose of this group is to assist patients in learning to regulate negative emotions and experience positive emotions. Patients will be guided to discuss ways in which they have been vulnerable to their negative emotions. These vulnerabilities will be juxtaposed with experiences of positive emotions or situations, and patients challenged to use positive emotions to combat negative ones. Special emphasis will be placed on coping with negative emotions in conflict situations, and patients will process healthy conflict resolution skills.  Therapeutic Goals: 1. Patient will identify two positive emotions or experiences to reflect on in order to balance out negative emotions:  2. Patient will label two or more emotions that they find the most difficult to experience:  3. Patient will be able to demonstrate positive conflict resolution skills through discussion or role plays:   Summary of Patient Progress:       Therapeutic Modalities:   Cognitive Behavioral Therapy Feelings Identification Dialectical Behavioral Therapy  Lurline Idol, LCSW

## 2017-10-13 NOTE — BHH Group Notes (Signed)
[place] Group Notes:  (Nursing/MHT/Case Management/Adjunct)  Date:  10/13/2017  Time:  10:39 AM  Type of Therapy:  Psychoeducational Skills  Participation Level:  Active  Participation Quality:  Appropriate, Attentive, Sharing and Supportive  Affect:  Appropriate  Cognitive:  Alert, Appropriate and Oriented  Insight:  Good  Engagement in Group:  Engaged and Supportive  Modes of Intervention:  Clarification, Discussion, Exploration and Support  Summary of Progress/Problems:  Kathi Ludwig 10/13/2017, 10:39 AM

## 2017-10-14 NOTE — Progress Notes (Addendum)
Patient ID: Deborah Melendez, female   DOB: 16-Jul-1976, 41 y.o.   MRN: [id number] Rude, irritable, hostile, guarded, poor body maintenance, evasive, refused to answer any question; room remains closer to the nurses' station for frequent monitor.
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
## 2017-10-14 NOTE — BHH Group Notes (Signed)
[place] LCSW Group Therapy Note  Date/Time: 10/14/17, 0930  Type of Therapy/Topic:  Group Therapy:  Balance in Life  Participation Level: active.   Description of Group:    This group will address the concept of balance and how it feels and looks when one is unbalanced. Patients will be encouraged to process areas in their lives that are out of balance, and identify reasons for remaining unbalanced. Facilitators will guide patients utilizing problem- solving interventions to address and correct the stressor making their life unbalanced. Understanding and applying boundaries will be explored and addressed for obtaining  and maintaining a balanced life. Patients will be encouraged to explore ways to assertively make their unbalanced needs known to significant others in their lives, using other group members and facilitator for support and feedback.  Therapeutic Goals: 1. Patient will identify two or more emotions or situations they have that consume much of in their lives. 2. Patient will identify signs/triggers that life has become out of balance:  3. Patient will identify two ways to set boundaries in order to achieve balance in their lives:  4. Patient will demonstrate ability to communicate their needs through discussion and/or role plays  Summary of Patient Progress: Pt frequently speaks but does not appear to think about what she is saying very much.  Pt said all areas that were discussed in group were out of balance for her and that she was very overwhelmed in figuring out how to address them.  Pt did not pay very much attention to group discussion to identify ways to approach life when feeling overwhelmed.          Therapeutic Modalities:   Cognitive Behavioral Therapy Solution-Focused Therapy Assertiveness Training  Lurline Idol, [place]

## 2017-10-14 NOTE — Plan of Care (Signed)
Problem: Activity: Goal: Sleeping patterns will improve Outcome: Progressing Patient slept for Estimated Hours of 7.15; Precautionary checks every 15 minutes for safety maintained, room free of safety hazards, patient sustains no injury or falls during this shift.    

## 2017-10-14 NOTE — Progress Notes (Signed)
Patient complains of dry mouth encouraged patient to drink more fluids. She complains of a cough and request guaifenex. She comes to the nursing station several times for different items. She denies si, hi, avh. No pain. She is med compliant. She complains of heartburn maalox given.

## 2017-10-14 NOTE — BHH Group Notes (Signed)
[place] Group Notes:  (Nursing/MHT/Case Management/Adjunct)  Date:  10/14/2017  Time:  9:18 PM  Type of Therapy:  Group Therapy  Participation Level:  Active  Participation Quality:  Appropriate  Affect:  Appropriate  Cognitive:  Appropriate  Insight:  Good  Engagement in Group:  Engaged  Modes of Intervention:  Support  Summary of Progress/Problems:  Deborah Melendez 10/14/2017, 9:18 PM

## 2017-10-15 MED ORDER — QUETIAPINE FUMARATE 300 MG PO TABS: 600.0000 mg | ORAL_TABLET | Freq: Every day | ORAL | 0 refills | Status: DC

## 2017-10-15 MED ORDER — SERTRALINE HCL 50 MG PO TABS: 50.0000 mg | ORAL_TABLET | Freq: Every day | ORAL | 0 refills | Status: DC

## 2017-10-15 MED ORDER — DIVALPROEX SODIUM 250 MG PO DR TAB: 750.0000 mg | DELAYED_RELEASE_TABLET | Freq: Two times a day (BID) | ORAL | 0 refills | Status: DC

## 2017-10-15 MED ORDER — QUETIAPINE FUMARATE 50 MG PO TABS: 50.0000 mg | ORAL_TABLET | Freq: Three times a day (TID) | ORAL | 0 refills | Status: DC

## 2017-10-15 MED ORDER — SULFAMETHOXAZOLE-TRIMETHOPRIM 800-160 MG PO TABS: 1.0000 | ORAL_TABLET | Freq: Two times a day (BID) | ORAL | 0 refills | Status: AC

## 2017-10-15 MED ORDER — FERROUS SULFATE 325 (65 FE) MG PO TABS: 325.0000 mg | ORAL_TABLET | Freq: Three times a day (TID) | ORAL | 0 refills | Status: DC

## 2017-10-15 MED ORDER — GABAPENTIN 600 MG PO TABS: 600.0000 mg | ORAL_TABLET | Freq: Three times a day (TID) | ORAL | 0 refills | Status: DC

## 2017-10-15 NOTE — Progress Notes (Signed)
[facility] MD Progress Note  10/15/2017 12:33 PM Deborah Melendez  MRN:  [id number] Subjective:  Pt doing well today. Mood has improved significantly and is feeling much more hopeful. She states that she still plans to go to [place] for at least a week to visit her mom and Clair Gulling who are very supportive. She is excited to see her 40 yo daughter. She is tolerated her medications and is glad to be back on them. She feels they have been very helpful. She appears much brighter than on admission. When asked, She denies SI, HI, AH, VH. She feels ready for discharge today.  Principal Problem: MDD (major depressive disorder), recurrent severe, without psychosis ([place]) Diagnosis:   Patient Active Problem List   Diagnosis Date Noted  . Bipolar 2 disorder, major depressive episode ([place]) [F31.81] 10/08/2017  . UTI (urinary tract infection) [N39.0] 10/08/2017  . Cocaine abuse with cocaine-induced mood disorder ([place]) [F14.14] 07/27/2017  . Tobacco use disorder [F17.200] 05/22/2017  . Major depressive disorder, recurrent severe without psychotic features ([place]) [F33.2] 05/22/2017  . Homicidal ideations [R45.850]   . Suicidal ideations [R45.851]   . Bipolar 1 disorder, depressed, severe ([place]) [F31.4] 02/04/2017  . 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 Cannabis use disorder, severe, dependence ([place]) [F12.20] 01/20/2017  . MDD (major depressive disorder), recurrent severe, without psychosis ([place]) [F33.2] 08/08/2015  . Cocaine use disorder, severe, dependence ([place]) [F14.20] 08/08/2015   Total Time spent with patient: 20 minutes  Past Psychiatric History: See H&P  Past Medical History:  Past Medical History:  Diagnosis Date  . MRSA (methicillin resistant Staphylococcus aureus)    surgery on finger 3 years ago  . Substance or medication-induced bipolar and related disorder with onset during intoxication ([place]) 09/08/2016    Past  Surgical History:  Procedure Laterality Date  . FINGER SURGERY     Family History:  Family History  Problem Relation Age of Onset  . Diabetes Mother   . Hypertension Mother   . Drug abuse Father   . Schizophrenia Maternal Aunt    Family Psychiatric  History: See H&P Social History:  History  Alcohol Use  . Yes    Comment: occasional      History  Drug Use  . Types: Cocaine    Comment: last used 3-4 days ago.    Social History   Social History  . Marital status: Single    Spouse name: N/A  . Number of children: N/A  . Years of education: N/A   Social History Main Topics  . Smoking status: Current Every Day Smoker    Packs/day: 0.50    Types: Cigarettes  . Smokeless tobacco: Never Used     Comment: pt reported quiting three weeks ago  . Alcohol use Yes     Comment: occasional   . Drug use: Yes    Types: Cocaine     Comment: last used 3-4 days ago.  Marland Kitchen Sexual activity: Yes    Birth control/ protection: None   Other Topics Concern  . None   Social History Narrative  . None                       Sleep: Poor  Appetite:  Good  Current Medications: Current Facility-Administered Medications  Medication Dose Route Frequency Provider Last Rate Last Dose  . acetaminophen (TYLENOL) tablet 650 mg  650 mg Oral Q6H PRN Pucilowska, Jolanta B, MD   650  mg at 10/15/17 0409  . acidophilus (RISAQUAD) capsule 2 capsule  2 capsule Oral Daily Kanoa Phillippi, Tyson Babinski, MD   2 capsule at 10/15/17 0923  . alum & mag hydroxide-simeth (MAALOX/MYLANTA) 200-200-20 MG/5ML suspension 30 mL  30 mL Oral Q4H PRN Pucilowska, Jolanta B, MD   30 mL at 10/14/17 2030  . divalproex (DEPAKOTE) DR tablet 750 mg  750 mg Oral Q12H Pucilowska, Jolanta B, MD   750 mg at 10/15/17 0918  . ferrous sulfate tablet 325 mg  325 mg Oral TID WC Estefan Pattison, Tyson Babinski, MD   325 mg at 10/15/17 1148  . gabapentin (NEURONTIN) tablet 600 mg  600 mg Oral TID Pucilowska, Jolanta B, MD   600 mg at 10/15/17 1148  .  guaiFENesin (MUCINEX) 12 hr tablet 600 mg  600 mg Oral Q8H PRN Ramond Dial, MD   600 mg at 10/15/17 0930  . hydrOXYzine (ATARAX/VISTARIL) tablet 50 mg  50 mg Oral TID PRN Pucilowska, Jolanta B, MD   50 mg at 10/15/17 0409  . magnesium hydroxide (MILK OF MAGNESIA) suspension 30 mL  30 mL Oral Daily PRN Pucilowska, Jolanta B, MD      . nicotine (NICODERM CQ - dosed in mg/24 hours) patch 21 mg  21 mg Transdermal Q0600 Pucilowska, Jolanta B, MD   21 mg at 10/14/17 0901  . QUEtiapine (SEROQUEL) tablet 50 mg  50 mg Oral TID Pucilowska, Jolanta B, MD   50 mg at 10/15/17 1148  . QUEtiapine (SEROQUEL) tablet 600 mg  600 mg Oral QHS Pucilowska, Jolanta B, MD   600 mg at 10/14/17 2140  . sertraline (ZOLOFT) tablet 50 mg  50 mg Oral Daily Khristen Cheyney, Tyson Babinski, MD   50 mg at 10/15/17 0919  . sulfamethoxazole-trimethoprim (BACTRIM DS,SEPTRA DS) 800-160 MG per tablet 1 tablet  1 tablet Oral Q12H Judythe Postema, Tyson Babinski, MD   1 tablet at 10/15/17 6045    Lab Results: No results found for this or any previous visit (from the past 48 hour(s)).  Blood Alcohol level:  Lab Results  Component Value Date   ETH <10 10/08/2017   ETH <5 40/98/1191    Metabolic Disorder Labs: Lab Results  Component Value Date   HGBA1C 5.6 10/09/2017   MPG 114.02 10/09/2017   MPG 108 01/21/2017   No results found for: PROLACTIN Lab Results  Component Value Date   CHOL 135 10/09/2017   TRIG 69 10/09/2017   HDL 52 10/09/2017   CHOLHDL 2.6 10/09/2017   VLDL 14 10/09/2017   LDLCALC 69 10/09/2017   LDLCALC 77 01/21/2017    Physical Findings: AIMS:  , ,  ,  ,    CIWA:  CIWA-Ar Total: 2  Musculoskeletal: Strength & Muscle Tone: within normal limits Gait & Station: normal Patient leans: N/A  Psychiatric Specialty Exam: Physical Exam  Nursing note and vitals reviewed.   Review of Systems  All other systems reviewed and are negative.   Blood pressure 118/83, pulse 95, temperature 97.9 F (36.6 C), temperature source Oral,  resp. rate 18, height 5\' 2"  (1.575 m), weight 62.6 kg (138 lb), SpO2 100 %.Body mass index is 25.24 kg/m.  General Appearance: Casual  Eye Contact:  Good  Speech:  Clear and Coherent  Volume:  Normal  Mood:  Euthymic  Affect:  Congruent  Thought Process:  Goal Directed  Orientation:  Full (Time, Place, and Person)  Thought Content:  Negative  Suicidal Thoughts:  No  Homicidal Thoughts:  No  Memory:  Immediate;   Good  Judgement:  Fair  Insight:  Fair  Psychomotor Activity:  Negative  Concentration:  Concentration: Good  Recall:  Good  Fund of Knowledge:  Good  Language:  Fair  Akathisia:  Negative      Assets:  Communication Skills  ADL's:  Intact  Cognition:  WNL  Sleep:  Number of Hours: 7.15     Treatment Plan Summary: -Discharge home today. She will be given 7 day supply of medications from our [place], MD 10/15/2017, 12:33 PM

## 2017-10-15 NOTE — Plan of Care (Signed)
Problem: Safety: Goal: Ability to remain free from injury will improve Outcome: Progressing Patient denies SI/HI/AV/H and contracts for safety.  Problem: Coping: Goal: Ability to verbalize frustrations and anger appropriately will improve Outcome: Not Progressing Patient made frequent contact with writer, asking for as needed medication for indigestion, pain, and anxiety.  She admitted to being excited about discharge and anxious at the same time.

## 2017-10-15 NOTE — BHH Group Notes (Signed)
[place] LCSW Group Therapy Note  Date/Time: 10/15/17, 0930  Type of Therapy and Topic:  Group Therapy:  Feelings around Relapse and Recovery  Participation Level:  Minimal   Mood:pleasant  Description of Group:    Patients in this group will discuss emotions they experience before and after a relapse. They will process how experiencing these feelings, or avoidance of experiencing them, relates to having a relapse. Facilitator will guide patients to explore emotions they have related to recovery. Patients will be encouraged to process which emotions are more powerful. They will be guided to discuss the emotional reaction significant others in their lives may have to patients' relapse or recovery. Patients will be assisted in exploring ways to respond to the emotions of others without this contributing to a relapse.  Therapeutic Goals: 1. Patient will identify two or more emotions that lead to relapse for them:  2. Patient will identify two emotions that result when they relapse:  3. Patient will identify two emotions related to recovery:  4. Patient will demonstrate ability to communicate their needs through discussion and/or role plays.   Summary of Patient Progress:Pt came to group 30 minutes late but was quite talkative once she arrived.      Therapeutic Modalities:   Cognitive Behavioral Therapy Solution-Focused Therapy Assertiveness Training Relapse Prevention Therapy  Lurline Idol, LCSW

## 2017-10-15 NOTE — BHH Suicide Risk Assessment (Signed)
[facility] Discharge Suicide Risk Assessment   Nursing information obtained from:    Current Mental Status:   Positive, hopeful, bright, cheerful  Historical Factors:   Polysubstance abuse, depression Risk Reduction Factors:   Sobriety, support  Total Time spent with patient: 20 minutes Principal Problem: MDD (major depressive disorder), recurrent severe, without psychosis ([place]) Diagnosis:   Patient Active Problem List   Diagnosis Date Noted  . Bipolar 2 disorder, major depressive episode ([place]) [F31.81] 10/08/2017  . UTI (urinary tract infection) [N39.0] 10/08/2017  . Cocaine abuse with cocaine-induced mood disorder ([place]) [F14.14] 07/27/2017  . Tobacco use disorder [F17.200] 05/22/2017  . Major depressive disorder, recurrent severe without psychotic features ([place]) [F33.2] 05/22/2017  . Homicidal ideations [R45.850]   . Suicidal ideations [R45.851]   . Bipolar 1 disorder, depressed, severe ([place]) [F31.4] 02/04/2017  . 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 Cannabis use disorder, severe, dependence ([place]) [F12.20] 01/20/2017  . MDD (major depressive disorder), recurrent severe, without psychosis ([place]) [F33.2] 08/08/2015  . Cocaine use disorder, severe, dependence ([place]) [F14.20] 08/08/2015   Subjective Data: Pt doing well today. Mood has improved significantly and is feeling much more hopeful. She states that she still plans to go to [place] for at least a week to visit her mom and Clair Gulling who are very supportive. She is excited to see her 4 yo daughter. She is tolerated her medications and is glad to be back on them. She feels they have been very helpful. She appears much brighter than on admission. When asked, She denies SI, HI, AH, VH. She feels ready for discharge today.  Continued Clinical Symptoms:  Alcohol Use Disorder Identification Test Final Score (AUDIT): 12 The "Alcohol Use Disorders Identification  Test", Guidelines for Use in Primary Care, Second Edition.  World Pharmacologist [facility]). Score between 0-7:  no or low risk or alcohol related problems. Score between 8-15:  moderate risk of alcohol related problems. Score between 16-19:  high risk of alcohol related problems. Score 20 or above:  warrants further diagnostic evaluation for alcohol dependence and treatment.   CLINICAL FACTORS:  Mood improved   Musculoskeletal: Strength & Muscle Tone: within normal limits Gait & Station: normal Patient leans: N/A  Psychiatric Specialty Exam: Physical Exam  ROS  Blood pressure 118/83, pulse 95, temperature 97.9 F (36.6 C), temperature source Oral, resp. rate 18, height 5\' 2"  (1.575 m), weight 62.6 kg (138 lb), SpO2 100 %.Body mass index is 25.24 kg/m.   General Appearance: Casual  Eye Contact:  Good  Speech:  Clear and Coherent  Volume:  Normal  Mood:  Euthymic  Affect:  Congruent  Thought Process:  Goal Directed  Orientation:  Full (Time, Place, and Person)  Thought Content:  Negative  Suicidal Thoughts:  No  Homicidal Thoughts:  No  Memory:  Immediate;   Good  Judgement:  Fair  Insight:  Fair  Psychomotor Activity:  Negative  Concentration:  Concentration: Good  Recall:  Good  Fund of Knowledge:  Good  Language:  Fair  Akathisia:  Negative      Assets:  Communication Skills  ADL's:  Intact  Cognition:  WNL  Sleep:  Number of Hours: 7.15      COGNITIVE FEATURES THAT CONTRIBUTE TO RISK:  None    SUICIDE RISK:   Minimal: No identifiable suicidal ideation.  Patients presenting with no risk factors but with morbid ruminations; may be classified as minimal risk based on the severity of  the depressive symptoms  PLAN OF CARE:  -Discharge today -Please follow up with [facility] on scheduled appointment -SOBRIETY! -Have patient look into [facility]  I certify that inpatient services furnished can reasonably be expected to improve the patient's condition.    Marylin Crosby, MD 10/15/2017, 12:45 PM

## 2017-10-15 NOTE — Discharge Summary (Signed)
Physician Discharge Summary Note  Patient:  Deborah Melendez is an 40 y.o., female MRN:  [id number] DOB:  [birth year] Patient phone:  [phone] (home)  Patient address:   [address] [place] [address],  Total Time spent with patient: 20 minutes  Date of Admission:  10/08/2017 Date of Discharge: 10/15/17  Reason for Admission:  Suicidal thoughts with a plan and polysubstance abuse  Principal Problem: MDD (major depressive disorder), recurrent severe, without psychosis [facility]) Discharge Diagnoses: Patient Active Problem List   Diagnosis Date Noted  . Bipolar 2 disorder, major depressive episode ([place]) [F31.81] 10/08/2017  . UTI (urinary tract infection) [N39.0] 10/08/2017  . Cocaine abuse with cocaine-induced mood disorder ([place]) [F14.14] 07/27/2017  . Tobacco use disorder [F17.200] 05/22/2017  . Major depressive disorder, recurrent severe without psychotic features ([place]) [F33.2] 05/22/2017  . Homicidal ideations [R45.850]   . Suicidal ideations [R45.851]   . Bipolar 1 disorder, depressed, severe ([place]) [F31.4] 02/04/2017  . 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 Cannabis use disorder, severe, dependence ([place]) [F12.20] 01/20/2017  . MDD (major depressive disorder), recurrent severe, without psychosis ([place]) [F33.2] 08/08/2015  . Cocaine use disorder, severe, dependence ([place]) [F14.20] 08/08/2015    Past Psychiatric History: See H&P  Past Medical History:  Past Medical History:  Diagnosis Date  . MRSA (methicillin resistant Staphylococcus aureus)    surgery on finger 3 years ago  . Substance or medication-induced bipolar and related disorder with onset during intoxication ([place]) 09/08/2016    Past Surgical History:  Procedure Laterality Date  . FINGER SURGERY     Family History:  Family History  Problem Relation Age of Onset  . Diabetes Mother   . Hypertension Mother   . Drug  abuse Father   . Schizophrenia Maternal Aunt    Family Psychiatric  History: See H&P Social History:  History  Alcohol Use  . Yes    Comment: occasional      History  Drug Use  . Types: Cocaine    Comment: last used 3-4 days ago.    Social History   Social History  . Marital status: Single    Spouse name: N/A  . Number of children: N/A  . Years of education: N/A   Social History Main Topics  . Smoking status: Current Every Day Smoker    Packs/day: 0.50    Types: Cigarettes  . Smokeless tobacco: Never Used     Comment: pt reported quiting three weeks ago  . Alcohol use Yes     Comment: occasional   . Drug use: Yes    Types: Cocaine     Comment: last used 3-4 days ago.  Marland Kitchen Sexual activity: Yes    Birth control/ protection: None   Other Topics Concern  . None   Social History Narrative  . None    Hospital Course:  Pt was restarted on her home medications including Depakote and Seroquel. She continued to show symptoms of severe depression and was started on Zoloft for this. She tolerated all medications well and by day of discharge was bright, cheerful and much more hopeful. She was tested for Chlamydia which was positive. She was treated with Azithromycin 1 g once. She also was found to have UTI and was started on Bactrim for this. Discussed with OB/gyn about removal of her nexplanon that she stated needed to come out however they were unable to do this while inpatient. She will need to have  this done as an outpatient. Counseled her on safe sex practices and strongly encouraged her to continue with birth control especially since she is on Depakote and discussed the teratogenicity risk on this medication if she were to get pregnant. She was not able to go back to [facility] substance treatment and [facility] had waiting list. She was not significantly motivated for substance treatment. She was interested in [facility] and will be given list so she can call these.   Physical  Findings: AIMS:  , ,  ,  ,    CIWA:  CIWA-Ar Total: 2 COWS:     Musculoskeletal: Strength & Muscle Tone: within normal limits Gait & Station: normal Patient leans: N/A  Psychiatric Specialty Exam: Physical Exam  Nursing note and vitals reviewed.   ROS  Blood pressure 118/83, pulse 95, temperature 97.9 F (36.6 C), temperature source Oral, resp. rate 18, height 5\' 2"  (1.575 m), weight 62.6 kg (138 lb), SpO2 100 %.Body mass index is 25.24 kg/m.    General Appearance: Casual  Eye Contact:  Good  Speech:  Clear and Coherent  Volume:  Normal  Mood:  Euthymic  Affect:  Congruent  Thought Process:  Goal Directed  Orientation:  Full (Time, Place, and Person)  Thought Content:  Negative  Suicidal Thoughts:  No  Homicidal Thoughts:  No  Memory:  Immediate;   Good  Judgement:  Fair  Insight:  Fair  Psychomotor Activity:  Negative  Concentration:  Concentration: Good  Recall:  Good  Fund of Knowledge:  Good  Language:  Fair  Akathisia:  Negative      Assets:  Communication Skills  ADL's:  Intact  Cognition:  WNL  Sleep:  Number of Hours: 7.15    Have you used any form of tobacco in the last 30 days? (Cigarettes, Smokeless Tobacco, Cigars, and/or Pipes): Yes  Has this patient used any form of tobacco in the last 30 days? (Cigarettes, Smokeless Tobacco, Cigars, and/or Pipes) Yes, Yes, A prescription for an FDA-approved tobacco cessation medication was offered at discharge and the patient refused  Blood Alcohol level:  Lab Results  Component Value Date   [facility] <10 10/08/2017   ETH <5 10/21/8526    Metabolic Disorder Labs:  Lab Results  Component Value Date   HGBA1C 5.6 10/09/2017   MPG 114.02 10/09/2017   MPG 108 01/21/2017   No results found for: PROLACTIN Lab Results  Component Value Date   CHOL 135 10/09/2017   TRIG 69 10/09/2017   HDL 52 10/09/2017   CHOLHDL 2.6 10/09/2017   VLDL 14 10/09/2017   LDLCALC 69 10/09/2017   LDLCALC 77 01/21/2017    See  Psychiatric Specialty Exam and Suicide Risk Assessment completed by Attending Physician prior to discharge.  Discharge destination:  Home  Is patient on multiple antipsychotic therapies at discharge:  No   Has Patient had three or more failed trials of antipsychotic monotherapy by history:  No  Recommended Plan for Multiple Antipsychotic Therapies: NA  Discharge Instructions    Increase activity slowly    Complete by:  As directed      Allergies as of 10/15/2017   No Known Allergies     Medication List    STOP taking these medications   hydrOXYzine 50 MG tablet Commonly known as:  ATARAX/VISTARIL     TAKE these medications     Indication  divalproex 250 MG DR tablet Commonly known as:  DEPAKOTE Take 3 tablets (750 mg total) by mouth every  12 (twelve) hours.  Indication:  Manic Phase of Manic-Depression   ferrous sulfate 325 (65 FE) MG tablet Take 1 tablet (325 mg total) by mouth 3 (three) times daily with meals.  Indication:  Anemia From Inadequate Iron in the Body   gabapentin 600 MG tablet Commonly known as:  NEURONTIN Take 1 tablet (600 mg total) by mouth 3 (three) times daily.  Indication:  Neuropathic Pain   QUEtiapine 300 MG tablet Commonly known as:  SEROQUEL Take 2 tablets (600 mg total) by mouth at bedtime.  Indication:  Depressive Phase of Manic-Depression   QUEtiapine 50 MG tablet Commonly known as:  SEROQUEL Take 1 tablet (50 mg total) by mouth 3 (three) times daily.  Indication:  Depressive Phase of Manic-Depression   sertraline 50 MG tablet Commonly known as:  ZOLOFT Take 1 tablet (50 mg total) by mouth daily.  Indication:  Major Depressive Disorder   sulfamethoxazole-trimethoprim 800-160 MG tablet Commonly known as:  BACTRIM DS,SEPTRA DS Take 1 tablet by mouth every 12 (twelve) hours.  Indication:  UTI      Follow-up Information    [facility]. Go on 10/19/2017.   Specialty:  Behavioral Health Why:  Please attend your apppointment on  Tuesday, 10/19/17, at 8am.  Please bring a copy of your hospital discharge paperwork. Contact information: [place] [address] [phone]        [facility] Follow up.   Specialty:  Addiction Medicine Contact information: [place] [address] [place] [address] [facility] Follow up on 10/26/2017.   Why:  Your appointment is at 8:30 am. Please bring a copy of your discharge paperwork to this appointment Contact information: [address] [place] [address] [phone]          Follow-up recommendations: Follow up with PCP and outpatient  Signed: Marylin Crosby, MD 10/15/2017, 12:38 PM

## 2017-10-15 NOTE — Progress Notes (Signed)
  [facility] Adult Case Management Discharge Plan :  Will you be returning to the same living situation after discharge:  No. Pt will be staying with friend. At discharge, do you have transportation home?: Yes,  friend Do you have the ability to pay for your medications: No. [place].  Release of information consent forms completed and in the chart;  Patient's signature needed at discharge.  Patient to Follow up at: Follow-up Information    [facility]. Go on 10/19/2017.   Specialty:  Behavioral Health Why:  Please attend your apppointment on Tuesday, 10/19/17, at 8am.  Please bring a copy of your hospital discharge paperwork. Contact information: [place] [address] [phone]           Next level of care provider has access to [place] and Suicide Prevention discussed: Yes,  friend  Have you used any form of tobacco in the last 30 days? (Cigarettes, Smokeless Tobacco, Cigars, and/or Pipes): Yes  Has patient been referred to the Quitline?: Patient refused referral  Patient has been referred for addiction treatment: Yes  [name], [place] 10/15/2017, 10:20 AM

## 2017-10-15 NOTE — Progress Notes (Signed)
Primary Care appointment scheduled per MD order. See aftercare visit summary for details.

## 2017-10-15 NOTE — Progress Notes (Signed)
Received Deborah Melendez this am in her room after breakfast, she received her medications. She denied all of there psychiatric symptoms and looking forward to discharge home tonight.

## 2017-10-15 NOTE — Progress Notes (Signed)
Discharge note:  Patient discharged home per MD order. Patient received all personal belongings from unit and locker.  Patient was agitated because her BF had arrived at the ED and she wanted to leave "immediately!"  Explained to patient that he needed to come to [facility] and when he arrived, she could leave.  Patient was extremely irritable and irrational, calling staff names.  Her BF arrived and patient was escorted off the unit with her AVS/belongings/prescriptions and belongings.  Patient understood all follow up information.  She left ambulatory with her husband.  She denies any thoughts of self harm.

## 2017-10-15 NOTE — Tx Team (Signed)
Interdisciplinary Treatment and Diagnostic Plan Update  10/15/2017 Time of Session: [place] MRN: [id number]  Principal Diagnosis: MDD (major depressive disorder), recurrent severe, without psychosis ([place])  Secondary Diagnoses: Principal Problem:   MDD (major depressive disorder), recurrent severe, without psychosis ([place]) Active Problems:   Cocaine use disorder, severe, dependence ([place])   Cannabis use disorder, severe, dependence ([place])   Suicidal ideations   Bipolar 2 disorder, major depressive episode ([place])   Current Medications:  Current Facility-Administered Medications  Medication Dose Route Frequency Provider Last Rate Last Dose  . acetaminophen (TYLENOL) tablet 650 mg  650 mg Oral Q6H PRN Pucilowska, Jolanta B, MD   650 mg at 10/15/17 0409  . acidophilus (RISAQUAD) capsule 2 capsule  2 capsule Oral Daily McNew, Tyson Babinski, MD   2 capsule at 10/15/17 0923  . alum & mag hydroxide-simeth (MAALOX/MYLANTA) 200-200-20 MG/5ML suspension 30 mL  30 mL Oral Q4H PRN Pucilowska, Jolanta B, MD   30 mL at 10/14/17 2030  . divalproex (DEPAKOTE) DR tablet 750 mg  750 mg Oral Q12H Pucilowska, Jolanta B, MD   750 mg at 10/15/17 0918  . ferrous sulfate tablet 325 mg  325 mg Oral TID WC McNew, Tyson Babinski, MD   325 mg at 10/15/17 0920  . gabapentin (NEURONTIN) tablet 600 mg  600 mg Oral TID Pucilowska, Jolanta B, MD   600 mg at 10/15/17 0920  . guaiFENesin (MUCINEX) 12 hr tablet 600 mg  600 mg Oral Q8H PRN Ramond Dial, MD   600 mg at 10/15/17 0930  . hydrOXYzine (ATARAX/VISTARIL) tablet 50 mg  50 mg Oral TID PRN Pucilowska, Jolanta B, MD   50 mg at 10/15/17 0409  . magnesium hydroxide (MILK OF MAGNESIA) suspension 30 mL  30 mL Oral Daily PRN Pucilowska, Jolanta B, MD      . nicotine (NICODERM CQ - dosed in mg/24 hours) patch 21 mg  21 mg Transdermal Q0600 Pucilowska, Jolanta B, MD   21 mg at 10/14/17 0901  . QUEtiapine (SEROQUEL) tablet 50 mg  50 mg Oral TID Pucilowska, Jolanta B, MD    50 mg at 10/15/17 0919  . QUEtiapine (SEROQUEL) tablet 600 mg  600 mg Oral QHS Pucilowska, Jolanta B, MD   600 mg at 10/14/17 2140  . sertraline (ZOLOFT) tablet 50 mg  50 mg Oral Daily McNew, Tyson Babinski, MD   50 mg at 10/15/17 0919  . sulfamethoxazole-trimethoprim (BACTRIM DS,SEPTRA DS) 800-160 MG per tablet 1 tablet  1 tablet Oral Q12H McNew, Tyson Babinski, MD   1 tablet at 10/15/17 7591   PTA Medications: Prescriptions Prior to Admission  Medication Sig Dispense Refill Last Dose  . gabapentin (NEURONTIN) 600 MG tablet Take 1 tablet (600 mg total) by mouth 3 (three) times daily. (Patient not taking: Reported on 10/08/2017) 90 tablet 1 Not Taking at Unknown time  . hydrOXYzine (ATARAX/VISTARIL) 50 MG tablet Take 1 tablet (50 mg total) by mouth 3 (three) times daily as needed for anxiety. (Patient not taking: Reported on 10/08/2017) 90 tablet 1 Not Taking at Unknown time  . QUEtiapine (SEROQUEL) 300 MG tablet Take 2 tablets (600 mg total) by mouth at bedtime. (Patient not taking: Reported on 10/08/2017) 60 tablet 1 Not Taking at Unknown time  . QUEtiapine (SEROQUEL) 50 MG tablet Take 1 tablet (50 mg total) by mouth 3 (three) times daily. (Patient not taking: Reported on 10/08/2017) 90 tablet 1 Not Taking at Unknown time  . [DISCONTINUED] divalproex (DEPAKOTE) 250 MG DR tablet Take 3  tablets (750 mg total) by mouth every 12 (twelve) hours. (Patient not taking: Reported on 10/08/2017) 180 tablet 1 Not Taking at Unknown time    Patient Stressors: Financial difficulties Substance abuse  Patient Strengths: Capable of independent living Agricultural engineer for treatment/growth  Treatment Modalities: Medication Management, Group therapy, Case management,  1 to 1 session with clinician, Psychoeducation, Recreational therapy.   Physician Treatment Plan for Primary Diagnosis: MDD (major depressive disorder), recurrent severe, without psychosis ([place]) Long Term Goal(s): Improvement in symptoms so as  ready for discharge   Short Term Goals: Ability to verbalize feelings will improve Ability to disclose and discuss suicidal ideas Ability to identify and develop effective coping behaviors will improve Ability to identify triggers associated with substance abuse/mental health issues will improve  Medication Management: Evaluate patient's response, side effects, and tolerance of medication regimen.  Therapeutic Interventions: 1 to 1 sessions, Unit Group sessions and Medication administration.  Evaluation of Outcomes: Adequate for Discharge  Physician Treatment Plan for Secondary Diagnosis: Principal Problem:   MDD (major depressive disorder), recurrent severe, without psychosis ([place]) Active Problems:   Cocaine use disorder, severe, dependence ([place])   Cannabis use disorder, severe, dependence ([place])   Suicidal ideations   Bipolar 2 disorder, major depressive episode ([place])  Long Term Goal(s): Improvement in symptoms so as ready for discharge   Short Term Goals: Ability to verbalize feelings will improve Ability to disclose and discuss suicidal ideas Ability to identify and develop effective coping behaviors will improve Ability to identify triggers associated with substance abuse/mental health issues will improve     Medication Management: Evaluate patient's response, side effects, and tolerance of medication regimen.  Therapeutic Interventions: 1 to 1 sessions, Unit Group sessions and Medication administration.  Evaluation of Outcomes: Adequate for Discharge   RN Treatment Plan for Primary Diagnosis: MDD (major depressive disorder), recurrent severe, without psychosis ([place]) Long Term Goal(s): Knowledge of disease and therapeutic regimen to maintain health will improve  Short Term Goals: Ability to verbalize feelings will improve, Ability to identify and develop effective coping behaviors will improve and Compliance with prescribed medications will improve  Medication Management:  RN will administer medications as ordered by provider, will assess and evaluate patient's response and provide education to patient for prescribed medication. RN will report any adverse and/or side effects to prescribing provider.  Therapeutic Interventions: 1 on 1 counseling sessions, Psychoeducation, Medication administration, Evaluate responses to treatment, Monitor vital signs and CBGs as ordered, Perform/monitor CIWA, COWS, AIMS and Fall Risk screenings as ordered, Perform wound care treatments as ordered.  Evaluation of Outcomes: Adequate for Discharge   LCSW Treatment Plan for Primary Diagnosis: MDD (major depressive disorder), recurrent severe, without psychosis ([place]) Long Term Goal(s): Safe transition to appropriate next level of care at discharge, Engage patient in therapeutic group addressing interpersonal concerns.  Short Term Goals: Engage patient in aftercare planning with referrals and resources and Increase skills for wellness and recovery  Therapeutic Interventions: Assess for all discharge needs, 1 to 1 time with Social worker, Explore available resources and support systems, Assess for adequacy in community support network, Educate family and significant other(s) on suicide prevention, Complete Psychosocial Assessment, Interpersonal group therapy.  Evaluation of Outcomes: Adequate for Discharge   Progress in Treatment: Attending groups: Yes Participating in groups:Yes Taking medication as prescribed: Yes. Toleration medication: Yes. Family/Significant other contact made: Yes, individual(s) contacted:  friend Patient understands diagnosis: Yes. Discussing patient identified problems/goals with staff: Yes. Medical problems stabilized or resolved: Yes. Denies suicidal/homicidal ideation:  Yes. Issues/concerns per patient self-inventory: No. Other: none   New problem(s) identified: No, Describe:  none  New Short Term/Long Term Goal(s):  Discharge Plan or Barriers: Pt  will follow up with [facility] in [facility].  Reason for Continuation of Hospitalization: None  Estimated Length of Stay:discharge today.  Attendees: Patient: 10/15/2017   Physician: Dr. Wonda Olds 10/15/2017   Nursing: Elige Radon, RN 10/15/2017   RN Care Manager: 10/15/2017   Social Worker: Lurline Idol, LCSW 10/15/2017   Recreational Therapist:  10/15/2017   Other:  10/15/2017   Other:  10/15/2017   Other: 10/15/2017           Scribe for Treatment Team: [name], [place] 10/15/2017 11:25 AM

## 2017-10-19 ENCOUNTER — Emergency Department (HOSPITAL_COMMUNITY)
Admission: EM | Admit: 2017-10-19 | Discharge: 2017-10-19 | Disposition: A | Discharge status: Home / Self Care | Payer: Self-pay | Attending: Emergency Medicine | Admitting: Emergency Medicine

## 2017-10-19 DIAGNOSIS — F17210 Nicotine dependence, cigarettes, uncomplicated: Secondary | ICD-10-CM | POA: Insufficient documentation

## 2017-10-19 DIAGNOSIS — Z79899 Other long term (current) drug therapy: Secondary | ICD-10-CM | POA: Insufficient documentation

## 2017-10-19 DIAGNOSIS — R21 Rash and other nonspecific skin eruption: Secondary | ICD-10-CM

## 2017-10-19 DIAGNOSIS — F1410 Cocaine abuse, uncomplicated: Secondary | ICD-10-CM | POA: Insufficient documentation

## 2017-10-19 DIAGNOSIS — L299 Pruritus, unspecified: Secondary | ICD-10-CM | POA: Insufficient documentation

## 2017-10-19 DIAGNOSIS — F319 Bipolar disorder, unspecified: Secondary | ICD-10-CM | POA: Insufficient documentation

## 2017-10-19 DIAGNOSIS — F1220 Cannabis dependence, uncomplicated: Secondary | ICD-10-CM | POA: Insufficient documentation

## 2017-10-19 MED ORDER — DIPHENHYDRAMINE HCL 25 MG PO CAPS: 25.0000 mg | ORAL_CAPSULE | Freq: Four times a day (QID) | ORAL | 0 refills | Status: DC | PRN

## 2017-10-19 MED ORDER — HYDROXYZINE HCL 25 MG PO TABS: 25.0000 mg | ORAL_TABLET | Freq: Four times a day (QID) | ORAL | 0 refills | Status: DC

## 2017-10-19 MED ORDER — DIPHENHYDRAMINE HCL 25 MG PO CAPS
25.0000 mg | ORAL_CAPSULE | Freq: Once | ORAL | Status: AC
  Administered 2017-10-19: 25 mg via ORAL
  Filled 2017-10-19: qty 1

## 2017-10-19 NOTE — ED Provider Notes (Signed)
[facility] DEPT Provider Note   CSN: [id number] Arrival date & time: 10/19/17  1451     History   Chief Complaint Chief Complaint  Patient presents with  . Pruritis    HPI Deborah Melendez is a 41 y.o. female.  HPI   Deborah Melendez is a 41 year old female with a history of depression, polysubstance abuse, bipolar disorder who presents the emergency department for evaluation of rash which began 2 nights ago. She states that she noticed bumps on her left arm and right upper thigh 2 days ago which is itchy. States the rash is worse when she itches. Has not tried any over-the-counter topical products or Benadryl. She was recently admitted for suicidal ideation, denies any new psychiatric medication from previous. She was recently treated for UTI and finished course of Bactrim. Denies new lotions, soaps, detergents. No recent outdoor travel. No close contacts with similar rash. Denies fever, headache, arthralgias, wound, abdominal pain, N/V.  Past Medical History:  Diagnosis Date  . MRSA (methicillin resistant Staphylococcus aureus)    surgery on finger 3 years ago  . Substance or medication-induced bipolar and related disorder with onset during intoxication ([place]) 09/08/2016    Patient Active Problem List   Diagnosis Date Noted  . Bipolar 2 disorder, major depressive episode ([place]) 10/08/2017  . UTI (urinary tract infection) 10/08/2017  . Cocaine abuse with cocaine-induced mood disorder ([place]) 07/27/2017  . Tobacco use disorder 05/22/2017  . Major depressive disorder, recurrent severe without psychotic features ([place]) 05/22/2017  . Homicidal ideations   . Suicidal ideations   . Bipolar 1 disorder, depressed, severe ([place]) 02/04/2017  . 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 Cannabis use disorder, severe, dependence ([place]) 01/20/2017  . MDD (major depressive disorder), recurrent severe,  without psychosis ([place]) 08/08/2015  . Cocaine use disorder, severe, dependence ([place]) 08/08/2015    Past Surgical History:  Procedure Laterality Date  . FINGER SURGERY      OB History    Gravida Para Term Preterm AB Living   7 5       5    SAB TAB Ectopic Multiple Live Births           1       Home Medications    Prior to Admission medications   Medication Sig Start Date End Date Taking? Authorizing Provider  diphenhydrAMINE (BENADRYL) 25 mg capsule Take 1 capsule (25 mg total) by mouth every 6 (six) hours as needed. 10/19/17   Glyn Ade, PA-C  divalproex (DEPAKOTE) 250 MG DR tablet Take 3 tablets (750 mg total) by mouth every 12 (twelve) hours. 10/15/17   McNew, Tyson Babinski, MD  ferrous sulfate 325 (65 FE) MG tablet Take 1 tablet (325 mg total) by mouth 3 (three) times daily with meals. 10/15/17 11/14/17  McNew, Tyson Babinski, MD  gabapentin (NEURONTIN) 600 MG tablet Take 1 tablet (600 mg total) by mouth 3 (three) times daily. 10/15/17 11/14/17  McNew, Tyson Babinski, MD  hydrOXYzine (ATARAX/VISTARIL) 25 MG tablet Take 1 tablet (25 mg total) by mouth every 6 (six) hours. 10/19/17   Glyn Ade, PA-C  QUEtiapine (SEROQUEL) 300 MG tablet Take 2 tablets (600 mg total) by mouth at bedtime. 10/15/17 11/14/17  McNew, Tyson Babinski, MD  QUEtiapine (SEROQUEL) 50 MG tablet Take 1 tablet (50 mg total) by mouth 3 (three) times daily. 10/15/17 11/14/17  McNew, Tyson Babinski, MD  sertraline (ZOLOFT) 50 MG tablet Take 1 tablet (50 mg  total) by mouth daily. 10/16/17   McNew, Tyson Babinski, MD    Family History Family History  Problem Relation Age of Onset  . Diabetes Mother   . Hypertension Mother   . Drug abuse Father   . Schizophrenia Maternal Aunt     Social History Social History  Substance Use Topics  . Smoking status: Current Every Day Smoker    Packs/day: 0.50    Types: Cigarettes  . Smokeless tobacco: Never Used     Comment: pt reported quiting three weeks ago  . Alcohol use Yes     Comment:  occasional      Allergies   Patient has no known allergies.   Review of Systems Review of Systems  Constitutional: Negative for chills, fatigue and fever.  Gastrointestinal: Negative for abdominal pain, nausea and vomiting.  Musculoskeletal: Negative for arthralgias, gait problem, joint swelling, myalgias, neck pain and neck stiffness.  Skin: Positive for rash. Negative for color change and wound.  Neurological: Negative for weakness, numbness and headaches.     Physical Exam Updated Vital Signs BP 117/69 (BP Location: Right Arm)   Pulse 99   Temp 98.1 F (36.7 C) (Oral)   Resp 18   Ht 5\' 2"  (1.575 m)   Wt 63.5 kg (140 lb)   LMP 10/12/2017   SpO2 100%   BMI 25.61 kg/m   Physical Exam  Constitutional: She is oriented to person, place, and time. She appears well-developed and well-nourished. No distress.  HENT:  Head: Normocephalic and atraumatic.  Eyes: Right eye exhibits no discharge. Left eye exhibits no discharge.  Pulmonary/Chest: Effort normal. No respiratory distress.  Neurological: She is alert and oriented to person, place, and time. Coordination normal.  Skin: Skin is warm and dry. Capillary refill takes less than 2 seconds. Rash noted. She is not diaphoretic.  Left arm with finely distributed red papular rash. Mild excoriations where patient has been itching, no open lesions. No vesicles, ecchymoses, petechiae. See picture below.  Several erythematous papules on right upper thigh. See pictures below.   Psychiatric: She has a normal mood and affect. Her behavior is normal.  Nursing note and vitals reviewed.        ED Treatments / Results  Labs (all labs ordered are listed, but only abnormal results are displayed) Labs Reviewed - No data to display  EKG  EKG Interpretation None       Radiology No results found.  Procedures Procedures (including critical care time)  Medications Ordered in ED Medications  diphenhydrAMINE (BENADRYL) capsule  25 mg (25 mg Oral Given 10/19/17 1620)     Initial Impression / Assessment and Plan / ED Course  I have reviewed the triage vital signs and the nursing notes.  Pertinent labs & imaging results that were available during my care of the patient were reviewed by me and considered in my medical decision making (see chart for details).    Patient with pruritic fine erythematous papular rash on the left upper arm and right upper thigh. No recent medication changes. Patient is afebrile. No overlying warmth, drainage, swelling/erythema to suggest infection. Patient has not tried over-the-counter medicines for pruritus. Will treat with OTC antihistamines. Have discussed return precautions and patient voices understanding. Discussed this patient with Dr. Sherry Ruffing who also agrees with plan to discharge.  Final Clinical Impressions(s) / ED Diagnoses   Final diagnoses:  Pruritus  Papular rash, localized    New Prescriptions New Prescriptions   DIPHENHYDRAMINE (BENADRYL) 25 MG CAPSULE  Take 1 capsule (25 mg total) by mouth every 6 (six) hours as needed.   HYDROXYZINE (ATARAX/VISTARIL) 25 MG TABLET    Take 1 tablet (25 mg total) by mouth every 6 (six) hours.     Glyn Ade, PA-C 10/20/17 1017    Tegeler, Gwenyth Allegra, MD 10/20/17 [phone]

## 2017-10-19 NOTE — Discharge Instructions (Signed)
The rash does not appear infected. No antibiotics necessary at this time. It is likely post-viral given you have had a recent illness with cough and congestion.  I have written you prescription for hydroxyzine which is a medicine to help with itching. It is nondrowsy. You may also take Benadryl for your symptoms. Remember that Benadryl can make you drowsy, please do not drive or drink alcohol while taking this medication.  Please return to the emergency department if you develop fever with rash, have worsening redness/swelling and pain over the rash. Also return if you have any new or worsening symptoms or concerns.

## 2017-10-19 NOTE — ED Triage Notes (Signed)
Pt c/o itching 2 nights ago. Itching is on arms, legs, and stomach. Hurts worse when scratches. Tried OTC "bug bite cream" without relief. No new medications or diet changes. Pt was hospitalized at [facility] for suicide/depression 1 week ago. Denies suicidal thoughts today.

## 2017-10-26 ENCOUNTER — Inpatient Hospital Stay: Payer: Self-pay

## 2017-11-03 ENCOUNTER — Encounter (HOSPITAL_COMMUNITY): Payer: Self-pay | Admitting: Obstetrics and Gynecology

## 2018-01-22 ENCOUNTER — Emergency Department
Admission: EM | Admit: 2018-01-22 | Discharge: 2018-01-22 | Disposition: A | Discharge status: Home / Self Care | Payer: Self-pay | Attending: Emergency Medicine | Admitting: Emergency Medicine

## 2018-01-22 ENCOUNTER — Other Ambulatory Visit: Payer: Self-pay

## 2018-01-22 ENCOUNTER — Encounter: Payer: Self-pay | Admitting: Intensive Care

## 2018-01-22 DIAGNOSIS — G8929 Other chronic pain: Secondary | ICD-10-CM | POA: Insufficient documentation

## 2018-01-22 DIAGNOSIS — F17210 Nicotine dependence, cigarettes, uncomplicated: Secondary | ICD-10-CM | POA: Insufficient documentation

## 2018-01-22 DIAGNOSIS — M25562 Pain in left knee: Secondary | ICD-10-CM | POA: Insufficient documentation

## 2018-01-22 DIAGNOSIS — Z760 Encounter for issue of repeat prescription: Secondary | ICD-10-CM

## 2018-01-22 DIAGNOSIS — Z79899 Other long term (current) drug therapy: Secondary | ICD-10-CM | POA: Insufficient documentation

## 2018-01-22 HISTORY — DX: Depression, unspecified: F32.A

## 2018-01-22 HISTORY — DX: Major depressive disorder, single episode, unspecified: F32.9

## 2018-01-22 LAB — BASIC METABOLIC PANEL
Anion gap: 9 (ref 5–15)
BUN: 17 mg/dL (ref 6–20)
CALCIUM: 9 mg/dL (ref 8.9–10.3)
CO2: 22 mmol/L (ref 22–32)
Chloride: 106 mmol/L (ref 101–111)
Creatinine, Ser: 0.81 mg/dL (ref 0.44–1.00)
GFR calc Af Amer: >60 mL/min (ref >60)
GFR calc non Af Amer: >60 mL/min (ref >60)
GLUCOSE: 96 mg/dL (ref 65–99)
POTASSIUM: 3.3 mmol/L — AB (ref 3.5–5.1)
Sodium: 137 mmol/L (ref 135–145)

## 2018-01-22 MED ORDER — DIVALPROEX SODIUM 250 MG PO DR TAB: 250.0000 mg | DELAYED_RELEASE_TABLET | Freq: Three times a day (TID) | ORAL | 2 refills | Status: DC

## 2018-01-22 MED ORDER — LORAZEPAM 1 MG PO TABS: 1.0000 mg | ORAL_TABLET | Freq: Two times a day (BID) | ORAL | 0 refills | Status: DC

## 2018-01-22 MED ORDER — FLUOXETINE HCL 20 MG PO CAPS: 20.0000 mg | ORAL_CAPSULE | Freq: Every day | ORAL | 2 refills | Status: DC

## 2018-01-22 NOTE — ED Provider Notes (Signed)
[facility] Emergency Department Provider Note   ____________________________________________   First MD Initiated Contact with Patient 01/22/18 0901     (approximate)  I have reviewed the triage vital signs and the nursing notes.   HISTORY  Chief Complaint Depression and Knee Pain (Left)    HPI Deborah Melendez is a 42 y.o. female who reports she is feeling somewhat anxious today. Asked if I can do something for that she says she sometimes takes the pH Xanax is. She also says she needs her Prozac and her Depakote refilled she says she takes 2501 and 50 of the other. She is not sure. Records review says that she was taking Zoloft and Depakote 253 times a day. We'll give her Prozac 20 once a day and a Depakote 253 times a day. I will give her prescription for Ativan 1 mg per 2 pills. She says she fell and hurt her knee some time ago and has been seen for in the emergency room twice. She says she had x-rays done and there was nothing going on. She complains of a lot of pain in the knee has been flaring up for last few days. Here the old records shows that she had a normal knee x-ray done 2 years ago.she has not followed up with orthopedics. She says her knee brace broke. She didn't bring it with her.   Past Medical History:  Diagnosis Date  . Depression   . MRSA (methicillin resistant Staphylococcus aureus)    surgery on finger 3 years ago  . Substance or medication-induced bipolar and related disorder with onset during intoxication ([place]) 09/08/2016    Patient Active Problem List   Diagnosis Date Noted  . Bipolar 2 disorder, major depressive episode ([place]) 10/08/2017  . UTI (urinary tract infection) 10/08/2017  . Cocaine abuse with cocaine-induced mood disorder ([place]) 07/27/2017  . Tobacco use disorder 05/22/2017  . Major depressive disorder, recurrent severe without psychotic features ([place]) 05/22/2017  . Homicidal ideations   . Suicidal ideations   .  Bipolar 1 disorder, depressed, severe ([place]) 02/04/2017  . 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 Cannabis use disorder, severe, dependence ([place]) 01/20/2017  . MDD (major depressive disorder), recurrent severe, without psychosis ([place]) 08/08/2015  . Cocaine use disorder, severe, dependence ([place]) 08/08/2015    Past Surgical History:  Procedure Laterality Date  . FINGER SURGERY      Prior to Admission medications   Medication Sig Start Date End Date Taking? Authorizing Provider  diphenhydrAMINE (BENADRYL) 25 mg capsule Take 1 capsule (25 mg total) by mouth every 6 (six) hours as needed. 10/19/17   Glyn Ade, PA-C  divalproex (DEPAKOTE) 250 MG DR tablet Take 3 tablets (750 mg total) by mouth every 12 (twelve) hours. 10/15/17   McNew, Tyson Babinski, MD  ferrous sulfate 325 (65 FE) MG tablet Take 1 tablet (325 mg total) by mouth 3 (three) times daily with meals. 10/15/17 11/14/17  McNew, Tyson Babinski, MD  gabapentin (NEURONTIN) 600 MG tablet Take 1 tablet (600 mg total) by mouth 3 (three) times daily. 10/15/17 11/14/17  McNew, Tyson Babinski, MD  hydrOXYzine (ATARAX/VISTARIL) 25 MG tablet Take 1 tablet (25 mg total) by mouth every 6 (six) hours. 10/19/17   Glyn Ade, PA-C  QUEtiapine (SEROQUEL) 300 MG tablet Take 2 tablets (600 mg total) by mouth at bedtime. 10/15/17 11/14/17  McNew, Tyson Babinski, MD  QUEtiapine (SEROQUEL) 50 MG tablet Take 1 tablet (50 mg  total) by mouth 3 (three) times daily. 10/15/17 11/14/17  McNew, Tyson Babinski, MD  sertraline (ZOLOFT) 50 MG tablet Take 1 tablet (50 mg total) by mouth daily. 10/16/17   McNew, Tyson Babinski, MD    Allergies Patient has no known allergies.  Family History  Problem Relation Age of Onset  . Diabetes Mother   . Hypertension Mother   . Drug abuse Father   . Schizophrenia Maternal Aunt     Social History Social History   Tobacco Use  . Smoking status: Current Every Day Smoker     Packs/day: 0.50    Types: Cigarettes  . Smokeless tobacco: Never Used  . Tobacco comment: pt reported quiting three weeks ago  Substance Use Topics  . Alcohol use: Yes    Comment: occasional   . Drug use: Yes    Types: Cocaine    Comment: last used 3-4 days ago.    Review of Systems  Constitutional: No fever/chills Eyes: No visual changes. ENT: No sore throat. Cardiovascular: Denies chest pain. Respiratory: Denies shortness of breath. Gastrointestinal: No abdominal pain.  No nausea, no vomiting.  No diarrhea.  No constipation. Genitourinary: Negative for dysuria. Musculoskeletal: Negative for back pain. Skin: Negative for rash. Neurological: Negative for headaches, focal weakness or   ____________________________________________   PHYSICAL EXAM:  VITAL SIGNS: ED Triage Vitals [01/22/18 0837]  Enc Vitals Group     BP (!) 158/98     Pulse Rate (!) 108     Resp 14     Temp 98.6 F (37 C)     Temp Source Oral     SpO2 99 %     Weight 165 lb (74.8 kg)     Height 5\' 4"  (1.626 m)     Head Circumference      Peak Flow      Pain Score 8     Pain Loc      Pain Edu?      Excl. in [place]?     Constitutional: Alert and oriented. Well appearing and in no acute distress.she does look somewhat anxious though Eyes: Conjunctivae are normal.  Head: Atraumatic. Nose: No congestion/rhinnorhea. Mouth/Throat: Mucous membranes are moist.  Oropharynx non-erythematous. Neck: No stridor.  Cardiovascular: Normal rate, regular rhythm. Grossly normal heart sounds.  Good peripheral circulation. Respiratory: Normal respiratory effort.  No retractions. Lungs CTAB. Gastrointestinal: Soft and nontender. No distention. No abdominal bruits. No CVA tenderness. Musculoskeletal: the knee appears to be stable on anterior and posterior drawer testing cartilage appears to be intact and do not feel a popping or clicking there is no joint effusion is no redness or erythema or warmth there is no tenderness  laterally or medially over the condyles skin appears to be normal patient says her foot feels normal Neurologic:  Normal speech and language. No gross focal neurologic deficits are appreciated. Skin:  Skin is warm, dry and intact. No rash noted. Psychiatric: Mood and affect are normal. Speech and behavior are normal. she does appear to be somewhat anxious as noticed above  ____________________________________________   LABS (all labs ordered are listed, but only abnormal results are displayed)  Labs Reviewed - No data to display ____________________________________________  EKG   ____________________________________________  RADIOLOGY    ____________________________________________   PROCEDURES  Procedure(s) performed:   Procedures  Critical Care performed:   ____________________________________________   INITIAL IMPRESSION / ASSESSMENT AND PLAN / ED COURSE  I will have the patient follow-up with orthopedics that she should've done previously. I  will have her continue taking the Naprosyn that she's been taking and give her a little bit of Ativan for her anxiety I will not give her Xanax so. I refilled her Prozac as she requested and the Depakote. I will not re-x-ray her knee at this point and she comes in again at any point we probably should repeat her lab work though.    BNP is normal essentially   ____________________________________________   FINAL CLINICAL IMPRESSION(S) / ED DIAGNOSES  Final diagnoses:  None     ED Discharge Orders    None       Note:  This document was prepared using Dragon voice recognition software and may include unintentional dictation errors.    Nena Polio, MD 01/22/18 [phone]

## 2018-01-22 NOTE — Discharge Instructions (Signed)
please use Tylenol or Motrin for the knee pain. Do not take more than 3 of the over-the-counter Motrin 3 times a day for the knee. You can use the Ativan one up to twice a day as needed for the anxiety for the next day or so. I have refilled the Prozac that you ask for and the Depakote that you're taking. Please follow-up with your regular doctor or see the [facility] or the [facility] or the [facility] or [facility] for further follow-up. Please follow-up with the orthopedic surgeon, Dr. Rudene Christians is on call. He came further evaluate your knee. I will try to get she the knee brace. If I cannot you can try an Ace wrap for the time being.

## 2018-01-22 NOTE — ED Triage Notes (Signed)
Patient reports she is here to have her L knee checked out. This morning it flared up and started hurting. Patient also reports she feels her "depression coming on" and wants to be started back on Prozac and Depakote. States she ran out of them X1 month ago. Denies HI/SI

## 2018-01-22 NOTE — ED Notes (Signed)
FIRST NURSE NOTE:  Pt states she is manic depressive and ran out of her medication 3 weeks ago and states she woke up with manic sxs. Pt also states she is depressed but denies any SI. Pt states she is feeling anxious and nervous. Pt also c/o knee pain from fall a year ago. Pt ambulatory in lobby.

## 2018-01-22 NOTE — ED Notes (Signed)
Patient denies HI/SI.

## 2018-01-22 NOTE — ED Notes (Signed)
Patient appears anxious and figity.

## 2018-01-22 NOTE — ED Notes (Signed)

## 2018-01-23 ENCOUNTER — Encounter (HOSPITAL_COMMUNITY): Payer: Self-pay

## 2018-01-23 ENCOUNTER — Emergency Department (HOSPITAL_COMMUNITY)
Admission: EM | Admit: 2018-01-23 | Discharge: 2018-01-26 | Disposition: A | Discharge status: Other Acute Inpatient Hospital | Payer: Self-pay | Attending: Emergency Medicine | Admitting: Emergency Medicine

## 2018-01-23 DIAGNOSIS — F3010 Manic episode without psychotic symptoms, unspecified: Secondary | ICD-10-CM | POA: Insufficient documentation

## 2018-01-23 DIAGNOSIS — Z046 Encounter for general psychiatric examination, requested by authority: Secondary | ICD-10-CM | POA: Insufficient documentation

## 2018-01-23 DIAGNOSIS — F17210 Nicotine dependence, cigarettes, uncomplicated: Secondary | ICD-10-CM | POA: Insufficient documentation

## 2018-01-23 DIAGNOSIS — Z79899 Other long term (current) drug therapy: Secondary | ICD-10-CM | POA: Insufficient documentation

## 2018-01-23 DIAGNOSIS — R45851 Suicidal ideations: Secondary | ICD-10-CM

## 2018-01-23 DIAGNOSIS — R451 Restlessness and agitation: Secondary | ICD-10-CM | POA: Insufficient documentation

## 2018-01-23 DIAGNOSIS — F3181 Bipolar II disorder: Secondary | ICD-10-CM | POA: Insufficient documentation

## 2018-01-23 LAB — CBC WITH DIFFERENTIAL/PLATELET
Basophils Absolute: 0 10*3/uL (ref 0.0–0.1)
Basophils Relative: 1 %
EOS ABS: 0.7 10*3/uL (ref 0.0–0.7)
Eosinophils Relative: 10 %
HEMATOCRIT: 38.5 % (ref 36.0–46.0)
HEMOGLOBIN: 13.2 g/dL (ref 12.0–15.0)
LYMPHS ABS: 3 10*3/uL (ref 0.7–4.0)
Lymphocytes Relative: 41 %
MCH: 29.9 pg (ref 26.0–34.0)
MCHC: 34.3 g/dL (ref 30.0–36.0)
MCV: 87.1 fL (ref 78.0–100.0)
MONOS PCT: 9 %
Monocytes Absolute: 0.6 10*3/uL (ref 0.1–1.0)
NEUTROS ABS: 2.7 10*3/uL (ref 1.7–7.7)
NEUTROS PCT: 39 %
Platelets: 214 10*3/uL (ref 150–400)
RBC: 4.42 MIL/uL (ref 3.87–5.11)
RDW: 16.1 % — ABNORMAL HIGH (ref 11.5–15.5)
WBC: 7.1 10*3/uL (ref 4.0–10.5)

## 2018-01-23 LAB — I-STAT BETA HCG BLOOD, ED (MC, WL, AP ONLY)

## 2018-01-23 LAB — RAPID URINE DRUG SCREEN, HOSP PERFORMED
Amphetamines: NOT DETECTED
Barbiturates: NOT DETECTED
Benzodiazepines: NOT DETECTED
Cocaine: POSITIVE — AB
OPIATES: NOT DETECTED
Tetrahydrocannabinol: POSITIVE — AB

## 2018-01-23 LAB — COMPREHENSIVE METABOLIC PANEL
ALT: 21 U/L (ref 14–54)
AST: 33 U/L (ref 15–41)
Albumin: 4.2 g/dL (ref 3.5–5.0)
Alkaline Phosphatase: 53 U/L (ref 38–126)
Anion gap: 8 (ref 5–15)
BUN: 11 mg/dL (ref 6–20)
CALCIUM: 9.2 mg/dL (ref 8.9–10.3)
CO2: 25 mmol/L (ref 22–32)
CREATININE: 0.84 mg/dL (ref 0.44–1.00)
Chloride: 107 mmol/L (ref 101–111)
Glucose, Bld: 102 mg/dL — ABNORMAL HIGH (ref 65–99)
Potassium: 3.2 mmol/L — ABNORMAL LOW (ref 3.5–5.1)
Sodium: 140 mmol/L (ref 135–145)
Total Bilirubin: 0.3 mg/dL (ref 0.3–1.2)
Total Protein: 7.9 g/dL (ref 6.5–8.1)

## 2018-01-23 LAB — ETHANOL

## 2018-01-23 LAB — ACETAMINOPHEN LEVEL

## 2018-01-23 LAB — SALICYLATE LEVEL: Salicylate Lvl: <7 mg/dL (ref 2.8–30.0)

## 2018-01-23 MED ORDER — LORAZEPAM 1 MG PO TABS
1.0000 mg | ORAL_TABLET | Freq: Two times a day (BID) | ORAL | Status: DC
  Administered 2018-01-23 – 2018-01-26 (×6): 1 mg via ORAL
  Filled 2018-01-23 (×6): qty 1

## 2018-01-23 MED ORDER — STERILE WATER FOR INJECTION IJ SOLN
INTRAMUSCULAR | Status: AC
  Administered 2018-01-23: 17:00:00
  Filled 2018-01-23: qty 10

## 2018-01-23 MED ORDER — QUETIAPINE FUMARATE 50 MG PO TABS
50.0000 mg | ORAL_TABLET | Freq: Three times a day (TID) | ORAL | Status: DC
  Administered 2018-01-24 – 2018-01-26 (×9): 50 mg via ORAL
  Filled 2018-01-23 (×10): qty 1

## 2018-01-23 MED ORDER — FLUOXETINE HCL 20 MG PO CAPS
20.0000 mg | ORAL_CAPSULE | Freq: Every day | ORAL | Status: DC
  Administered 2018-01-24 – 2018-01-26 (×3): 20 mg via ORAL
  Filled 2018-01-23 (×3): qty 1

## 2018-01-23 MED ORDER — FERROUS SULFATE 325 (65 FE) MG PO TABS
325.0000 mg | ORAL_TABLET | Freq: Three times a day (TID) | ORAL | Status: DC
  Administered 2018-01-24 – 2018-01-26 (×7): 325 mg via ORAL
  Filled 2018-01-23 (×10): qty 1

## 2018-01-23 MED ORDER — STERILE WATER FOR INJECTION IJ SOLN
INTRAMUSCULAR | Status: AC
  Administered 2018-01-23: 10 mL
  Filled 2018-01-23: qty 10

## 2018-01-23 MED ORDER — DIVALPROEX SODIUM 250 MG PO DR TAB
750.0000 mg | DELAYED_RELEASE_TABLET | Freq: Two times a day (BID) | ORAL | Status: DC
  Administered 2018-01-23 – 2018-01-26 (×6): 750 mg via ORAL
  Filled 2018-01-23 (×6): qty 1

## 2018-01-23 MED ORDER — GABAPENTIN 300 MG PO CAPS
600.0000 mg | ORAL_CAPSULE | Freq: Three times a day (TID) | ORAL | Status: DC
Start: 2018-01-23 — End: 2018-01-26
  Administered 2018-01-24 – 2018-01-26 (×7): 600 mg via ORAL
  Filled 2018-01-23 (×8): qty 2

## 2018-01-23 MED ORDER — SERTRALINE HCL 50 MG PO TABS
50.0000 mg | ORAL_TABLET | Freq: Every day | ORAL | Status: DC
Start: 2018-01-23 — End: 2018-01-26
  Administered 2018-01-24 – 2018-01-26 (×3): 50 mg via ORAL
  Filled 2018-01-23 (×3): qty 1

## 2018-01-23 MED ORDER — ZIPRASIDONE MESYLATE 20 MG IM SOLR
10.0000 mg | Freq: Once | INTRAMUSCULAR | Status: AC
  Administered 2018-01-23: 10 mg via INTRAMUSCULAR
  Filled 2018-01-23: qty 20

## 2018-01-23 MED ORDER — DIVALPROEX SODIUM 250 MG PO DR TAB
250.0000 mg | DELAYED_RELEASE_TABLET | Freq: Three times a day (TID) | ORAL | Status: DC
  Administered 2018-01-23: 250 mg via ORAL
  Filled 2018-01-23: qty 1

## 2018-01-23 MED ORDER — QUETIAPINE FUMARATE 300 MG PO TABS
600.0000 mg | ORAL_TABLET | Freq: Every day | ORAL | Status: DC
  Administered 2018-01-23 – 2018-01-25 (×3): 600 mg via ORAL
  Filled 2018-01-23 (×3): qty 2

## 2018-01-23 MED ORDER — POTASSIUM CHLORIDE CRYS ER 20 MEQ PO TBCR: 40.0000 meq | EXTENDED_RELEASE_TABLET | Freq: Once | ORAL | Status: DC

## 2018-01-23 NOTE — ED Notes (Signed)
Second attempt, patient refused blood draw.

## 2018-01-23 NOTE — ED Provider Notes (Signed)
[place] DEPT Provider Note   CSN: [id number] Arrival date & time: 01/23/18  0844     History   Chief Complaint Chief Complaint  Patient presents with  . Suicidal  . IVC     HPI   Blood pressure (!) 96/51, pulse (!) 110, temperature 98.5 F (36.9 C), temperature source Oral, resp. rate 20, last menstrual period 01/19/2018, SpO2 98 %.  Deborah Melendez is a 42 y.o. female complaining of feeling out of control after doing cocaine yesterday, she states that she is picking at her face and she feels manic.  She has suicidal ideation with no particular plan, she has a prior suicide attempt when she slit her wrists.  No homicidal ideation, auditory or visual hallucinations.  Patient states she went to [facility] yesterday but left because she feels that they were talking about her.  Past Medical History:  Diagnosis Date  . Depression   . MRSA (methicillin resistant Staphylococcus aureus)    surgery on finger 3 years ago  . Substance or medication-induced bipolar and related disorder with onset during intoxication ([place]) 09/08/2016    Patient Active Problem List   Diagnosis Date Noted  . Bipolar 2 disorder, major depressive episode ([place]) 10/08/2017  . UTI (urinary tract infection) 10/08/2017  . Cocaine abuse with cocaine-induced mood disorder ([place]) 07/27/2017  . Tobacco use disorder 05/22/2017  . Major depressive disorder, recurrent severe without psychotic features ([place]) 05/22/2017  . Homicidal ideations   . Suicidal ideations   . Bipolar 1 disorder, depressed, severe ([place]) 02/04/2017  . 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 Cannabis use disorder, severe, dependence ([place]) 01/20/2017  . MDD (major depressive disorder), recurrent severe, without psychosis ([place]) 08/08/2015  . Cocaine use disorder, severe, dependence ([place]) 08/08/2015    Past Surgical History:  Procedure  Laterality Date  . FINGER SURGERY      OB History    Gravida Para Term Preterm AB Living   6 4       4    SAB TAB Ectopic Multiple Live Births           0       Home Medications    Prior to Admission medications   Medication Sig Start Date End Date Taking? Authorizing Provider  diphenhydrAMINE (BENADRYL) 25 mg capsule Take 1 capsule (25 mg total) by mouth every 6 (six) hours as needed. 10/19/17   Glyn Ade, PA-C  divalproex (DEPAKOTE) 250 MG DR tablet Take 3 tablets (750 mg total) by mouth every 12 (twelve) hours. 10/15/17   McNew, Tyson Babinski, MD  divalproex (DEPAKOTE) 250 MG DR tablet Take 1 tablet (250 mg total) by mouth 3 (three) times daily. 01/22/18 01/22/19  Nena Polio, MD  ferrous sulfate 325 (65 FE) MG tablet Take 1 tablet (325 mg total) by mouth 3 (three) times daily with meals. 10/15/17 11/14/17  McNew, Tyson Babinski, MD  FLUoxetine (PROZAC) 20 MG capsule Take 1 capsule (20 mg total) by mouth daily. 01/22/18 01/22/19  Nena Polio, MD  gabapentin (NEURONTIN) 600 MG tablet Take 1 tablet (600 mg total) by mouth 3 (three) times daily. 10/15/17 11/14/17  McNew, Tyson Babinski, MD  hydrOXYzine (ATARAX/VISTARIL) 25 MG tablet Take 1 tablet (25 mg total) by mouth every 6 (six) hours. 10/19/17   Glyn Ade, PA-C  LORazepam (ATIVAN) 1 MG tablet Take 1 tablet (1 mg total) by mouth 2 (two) times daily. 01/22/18 01/22/19  Conni Slipper  F, MD  QUEtiapine (SEROQUEL) 300 MG tablet Take 2 tablets (600 mg total) by mouth at bedtime. 10/15/17 11/14/17  McNew, Tyson Babinski, MD  QUEtiapine (SEROQUEL) 50 MG tablet Take 1 tablet (50 mg total) by mouth 3 (three) times daily. 10/15/17 11/14/17  McNew, Tyson Babinski, MD  sertraline (ZOLOFT) 50 MG tablet Take 1 tablet (50 mg total) by mouth daily. 10/16/17   McNew, Tyson Babinski, MD    Family History Family History  Problem Relation Age of Onset  . Diabetes Mother   . Hypertension Mother   . Drug abuse Father   . Schizophrenia Maternal Aunt     Social  History Social History   Tobacco Use  . Smoking status: Current Every Day Smoker    Packs/day: 0.50    Types: Cigarettes  . Smokeless tobacco: Never Used  . Tobacco comment: pt reported quiting three weeks ago  Substance Use Topics  . Alcohol use: Yes    Comment: occasional   . Drug use: Yes    Types: Cocaine    Comment: last used 3-4 days ago.     Allergies   Patient has no known allergies.   Review of Systems Review of Systems  Unable to perform ROS: Psychiatric disorder      Physical Exam Updated Vital Signs BP 121/87   Pulse 94   Temp 98.5 F (36.9 C) (Oral)   Resp (!) 21   LMP 01/19/2018 (Exact Date)   SpO2 99%   Physical Exam  Constitutional: She is oriented to person, place, and time. She appears well-developed and well-nourished. No distress.  HENT:  Head: Normocephalic and atraumatic.  Mouth/Throat: Oropharynx is clear and moist.  Eyes: Conjunctivae and EOM are normal. Pupils are equal, round, and reactive to light.  Neck: Normal range of motion.  Cardiovascular: Regular rhythm and intact distal pulses.  Tachycardic  Pulmonary/Chest: Effort normal and breath sounds normal.  Abdominal: Soft. There is no tenderness.  Musculoskeletal: Normal range of motion.  Neurological: She is alert and oriented to person, place, and time.  Skin: She is not diaphoretic.  Psychiatric: Her affect is angry and labile. She is agitated and aggressive. She expresses suicidal ideation. She expresses no homicidal ideation. She expresses no suicidal plans and no homicidal plans. She is noncommunicative.  Labile, erratic, and angry mood  Nursing note and vitals reviewed.    ED Treatments / Results  Labs (all labs ordered are listed, but only abnormal results are displayed) Labs Reviewed  COMPREHENSIVE METABOLIC PANEL - Abnormal; Notable for the following components:      Result Value   Potassium 3.2 (*)    Glucose, Bld 102 (*)    All other components within normal  limits  CBC WITH DIFFERENTIAL/PLATELET - Abnormal; Notable for the following components:   RDW 16.1 (*)    All other components within normal limits  ACETAMINOPHEN LEVEL - Abnormal; Notable for the following components:   Acetaminophen (Tylenol), Serum <10 (*)    All other components within normal limits  ETHANOL  SALICYLATE LEVEL  RAPID URINE DRUG SCREEN, HOSP PERFORMED  I-STAT BETA HCG BLOOD, ED ([facility], [facility], [facility] ONLY)    EKG  EKG Interpretation None       Radiology No results found.  Procedures Procedures (including critical care time)  Medications Ordered in ED Medications  sterile water (preservative free) injection (not administered)  potassium chloride SA (K-DUR,KLOR-CON) CR tablet 40 mEq (not administered)  divalproex (DEPAKOTE) DR tablet 750 mg (not administered)  divalproex (  DEPAKOTE) DR tablet 250 mg (not administered)  ferrous sulfate tablet 325 mg (not administered)  FLUoxetine (PROZAC) capsule 20 mg (not administered)  gabapentin (NEURONTIN) tablet 600 mg (not administered)  LORazepam (ATIVAN) tablet 1 mg (not administered)  QUEtiapine (SEROQUEL) tablet 600 mg (not administered)  QUEtiapine (SEROQUEL) tablet 50 mg (not administered)  sertraline (ZOLOFT) tablet 50 mg (not administered)  ziprasidone (GEODON) injection 10 mg (10 mg Intramuscular Given 01/23/18 1152)  sterile water (preservative free) injection (10 mLs  Given 01/23/18 1152)  ziprasidone (GEODON) injection 10 mg (10 mg Intramuscular Given 01/23/18 1400)     Initial Impression / Assessment and Plan / ED Course  I have reviewed the triage vital signs and the nursing notes.  Pertinent labs & imaging results that were available during my care of the patient were reviewed by me and considered in my medical decision making (see chart for details).     Vitals:   01/23/18 0859 01/23/18 1430 01/23/18 1500  BP: (!) 96/51 104/84 121/87  Pulse: (!) 110 93 94  Resp: 20 (!) 22 (!) 21  Temp: 98.5 F (36.9  C)    TempSrc: Oral    SpO2: 98% 99% 99%    Medications  sterile water (preservative free) injection (not administered)  potassium chloride SA (K-DUR,KLOR-CON) CR tablet 40 mEq (not administered)  divalproex (DEPAKOTE) DR tablet 750 mg (not administered)  divalproex (DEPAKOTE) DR tablet 250 mg (not administered)  ferrous sulfate tablet 325 mg (not administered)  FLUoxetine (PROZAC) capsule 20 mg (not administered)  gabapentin (NEURONTIN) tablet 600 mg (not administered)  LORazepam (ATIVAN) tablet 1 mg (not administered)  QUEtiapine (SEROQUEL) tablet 600 mg (not administered)  QUEtiapine (SEROQUEL) tablet 50 mg (not administered)  sertraline (ZOLOFT) tablet 50 mg (not administered)  ziprasidone (GEODON) injection 10 mg (10 mg Intramuscular Given 01/23/18 1152)  sterile water (preservative free) injection (10 mLs  Given 01/23/18 1152)  ziprasidone (GEODON) injection 10 mg (10 mg Intramuscular Given 01/23/18 1400)    Deborah Melendez is 42 y.o. female presenting with manic, aggressive labile and inappropriate behavior, she is exhibiting paranoia as well.  Do not believe she has been compliant with her psychiatric medications.  Initially this patient is declining blood draw, EKG for pre-Geodon baseline obtained.  I have asked this patient again to allow [place] to obtain blood work and she states she will be cooperative.  Patient has been noncompliant with obtaining urine and blood work.  10 mg of Geodon given  After waiting an hour after Geodon patient remains aggressive, she is swinging when we have tried to attempt to get blood work.  Patient will be IV seed in the second 10 mg of Geodon administered.  Mild hypokalemia, will replete orally.  Patient is medically cleared for psychiatric evaluation will be transferred to the psych ED. TTS consulted, home meds and psych standard holding orders placed.    Final Clinical Impressions(s) / ED Diagnoses   Final diagnoses:  Suicidal ideation   Manic behavior [facility])    ED Discharge Orders    None       Karen Kays Charna Elizabeth 01/23/18 [place]    Milton Ferguson, MD 01/23/18 [phone]

## 2018-01-23 NOTE — ED Notes (Signed)
Patient has one belonging bag in cabinet on yellow nurse's station

## 2018-01-23 NOTE — ED Notes (Signed)
Blood draw attempted unsuccessful. As soon as pt was stuck with needle the pt started to scream " take it out take it out" while thrashing around in chair. Pt then asked me where her baby was and if she could have food. I stated to the pt I didn't know the answer to either question. Pt then started yelling at me to " get out, get me someone else to talk to." RN made aware as to why a second blood draw wasn't attempted on pt.

## 2018-01-23 NOTE — ED Notes (Signed)
[employer] here serving patient with IVC papers

## 2018-01-23 NOTE — ED Notes (Signed)
Bed: WA10 Expected date:  Expected time:  Means of arrival:  Comments: 

## 2018-01-23 NOTE — ED Notes (Signed)
Bed: [facility] Expected date:  Expected time:  Means of arrival:  Comments: 

## 2018-01-23 NOTE — ED Notes (Signed)
Patient given [place] sandwich, ham sandwich and apple juice in exchange for blood work.

## 2018-01-23 NOTE — ED Notes (Signed)
Patient refused lab draw. RN made aware. 

## 2018-01-23 NOTE — ED Triage Notes (Signed)
She tells [place] "I am suicidal and I can't help picking on my face" she also states "I was just at a [facility] and they were talking about me so I left".

## 2018-01-23 NOTE — ED Notes (Addendum)
[facility] ED Provider at bedside. Patient still refusing for her to get blood at this time.  Elmyra Ricks PA states that she will IVC patient.

## 2018-01-23 NOTE — ED Notes (Signed)
Patient refused 6pm vitals.

## 2018-01-23 NOTE — ED Notes (Addendum)
PATIENT REFUSES ALL TREATMENT AND STATES  TO LEAVE HER ALONE!

## 2018-01-23 NOTE — ED Notes (Signed)
Bed: [facility] Expected date:  Expected time:  Means of arrival:  Comments: NO BED

## 2018-01-24 NOTE — BH Assessment (Signed)
[facility] Assessment Progress Note  Case was staffed with Parks FNP who recommended patient be monitored and observed for safety. Patient will be seen by psychiatry in the a.m.      

## 2018-01-24 NOTE — ED Notes (Signed)
When pt does not get what she wants she becomes verbally aggressive and bangs on the glass window.

## 2018-01-24 NOTE — BH Assessment (Addendum)
Assessment Note  Deborah Melendez is an 42 y.o. female that presents this date with IVC. Per IVC: "Respondent presents to ED with S/I. Respondent has a prior suicide attempt and has tried to cut her wrists in the past. Respondent states she has been using cocaine and states she feels out of control. Respondent is very aggressive while in the ED." This writer attempts unsuccessfully to assess patient. Patient is observed to be angry speaking in a loud voice and threatening this Probation officer demanding that this Probation officer leave her room. Information to complete assessment was obtained form admission notes and prior history. Per admission notes, patient has been observed to be irritated and loud since admission. Patient ignored writers questions related to current SI/, HI, AVH depression, anxiety, or pain at this time, and appears otherwise stable and free of distress. Patient is presenting this date complaining of feeling out of control after doing cocaine yesterday, she states that she is picking at her face and she feels manic. She has suicidal ideation with no particular plan, she has a prior suicide attempt when she slit her wrists. No homicidal ideation, auditory or visual hallucinations. Per history review patient has a history of Bipolar disorder although no indication of a current OP provider. It is unsure at this time due to patient's condition whether or not patient has been prescribed any medications to assists with diagnosis in the past. Patient was last seen on 10/08/17 at [facility] presenting with similar symptoms associated with S/I and SA use. Patient's UDS is positive this date for cocaine and THC. Case was staffed with Romilda Garret FNP who recommended patient be monitored and observed for safety. Patient will be seen by psychiatry in the a.m.      Diagnosis: F31.9 Bipolar (per history) Polysubstance abuse  Past Medical History:  Past Medical History:  Diagnosis Date  . Depression   . MRSA (methicillin  resistant Staphylococcus aureus)    surgery on finger 3 years ago  . Substance or medication-induced bipolar and related disorder with onset during intoxication ([place]) 09/08/2016    Past Surgical History:  Procedure Laterality Date  . FINGER SURGERY      Family History:  Family History  Problem Relation Age of Onset  . Diabetes Mother   . Hypertension Mother   . Drug abuse Father   . Schizophrenia Maternal Aunt     Social History:  reports that she has been smoking cigarettes.  She has been smoking about 0.50 packs per day. she has never used smokeless tobacco. She reports that she drinks alcohol. She reports that she uses drugs. Drug: Cocaine.  Additional Social History:  Alcohol / Drug Use Pain Medications: See PTA Prescriptions: See PTA Over the Counter: See PTA History of alcohol / drug use?: Yes Longest period of sobriety (when/how long): Unable to quantify  Negative Consequences of Use: Financial, Personal relationships Withdrawal Symptoms: Agitation, Tremors Substance #1 Name of Substance 1: Cocaine (Crack) 1 - Age of First Use: Unknown 1 - Amount (size/oz): Pt states "as much as I can get" 1 - Frequency: Daily 1 - Duration: Pt states "years" 1 - Last Use / Amount: 01/23/18 Unknown amount  CIWA: CIWA-Ar BP: 98/65 Pulse Rate: 98 COWS:    Allergies: No Known Allergies  Home Medications:  (Not in a hospital admission)  OB/GYN Status:  Patient's last menstrual period was 01/19/2018 (exact date).  General Assessment Data [facility]: [facility] TTS Assessment: In system Is this a Tele or Face-to-Face Assessment?: Face-to-Face Is  this an Initial Assessment or a Re-assessment for this encounter?: Initial Assessment Marital status: Single Maiden name: NA Is patient pregnant?: No Pregnancy Status: No Living Arrangements: Alone Can pt return to current living arrangement?: Yes Admission Status: Involuntary Is patient capable of signing voluntary  admission?: Yes Referral Source: Self/Family/Friend Insurance type: self Pay  Medical Screening Exam ([place]) Medical Exam completed: Yes  Crisis Care Plan Living Arrangements: Alone Legal Guardian: (NA) Name of Psychiatrist: None Name of Therapist: None  Education Status Is patient currently in school?: No Current Grade: (NA) Highest grade of school patient has completed: (12) Name of school: (NA) Contact person: (NA)  Risk to self with the past 6 months Suicidal Ideation: Yes-Currently Present Has patient been a risk to self within the past 6 months prior to admission? : No Suicidal Intent: No Has patient had any suicidal intent within the past 6 months prior to admission? : No Is patient at risk for suicide?: Yes Suicidal Plan?: No Has patient had any suicidal plan within the past 6 months prior to admission? : No Access to Means: No What has been your use of drugs/alcohol within the last 12 months?: Current use Previous Attempts/Gestures: Yes How many times?: 2 Other Self Harm Risks: (NA) Triggers for Past Attempts: Unknown Intentional Self Injurious Behavior: None Family Suicide History: No Recent stressful life event(s): Other (Comment)(Excessive SA use) Persecutory voices/beliefs?: No Depression: No Depression Symptoms: (NA) Substance abuse history and/or treatment for substance abuse?: Yes Suicide prevention information given to non-admitted patients: Not applicable  Risk to Others within the past 6 months Homicidal Ideation: No Does patient have any lifetime risk of violence toward others beyond the six months prior to admission? : No Thoughts of Harm to Others: No Current Homicidal Intent: No Current Homicidal Plan: No Access to Homicidal Means: No Identified Victim: NA History of harm to others?: No Assessment of Violence: None Noted Violent Behavior Description: NA Does patient have access to weapons?: No Criminal Charges Pending?: No Does  patient have a court date: No Is patient on probation?: No  Psychosis Hallucinations: None noted Delusions: None noted  Mental Status Report Appearance/Hygiene: In scrubs Eye Contact: Poor Motor Activity: Agitation Speech: Argumentative, Loud Level of Consciousness: Combative, Irritable Mood: Threatening Affect: Angry Anxiety Level: Moderate Thought Processes: Unable to Assess Judgement: Unable to Assess Orientation: Unable to assess Obsessive Compulsive Thoughts/Behaviors: Unable to Assess  Cognitive Functioning Concentration: Unable to Assess Memory: Unable to Assess IQ: Average Insight: Unable to Assess Impulse Control: Unable to Assess Appetite: (UTA) Weight Loss: (UTA) Weight Gain: (UTA) Sleep: (UTA) Total Hours of Sleep: (UTA) Vegetative Symptoms: Unable to Assess  ADLScreening [facility] Assessment Services) Patient's cognitive ability adequate to safely complete daily activities?: Yes Patient able to express need for assistance with ADLs?: Yes Independently performs ADLs?: Yes (appropriate for developmental age)  Prior Inpatient Therapy Prior Inpatient Therapy: Yes Prior Therapy Dates: 2018 Prior [facility] Facilty/Provider[facility]): [facility] Reason for Treatment: SA issues, MH issues  Prior Outpatient Therapy Prior Outpatient Therapy: No Prior Therapy Dates: NA Prior Therapy Facilty/Provider(s): NA Reason for Treatment: NA Does patient have an ACCT team?: No Does patient have Intensive In-House Services?  : No Does patient have [facility] services? : Unknown Does patient have P4CC services?: No  ADL Screening (condition at time of admission) Patient's cognitive ability adequate to safely complete daily activities?: Yes Is the patient deaf or have difficulty hearing?: No Does the patient have difficulty seeing, even when wearing glasses/contacts?: No Does the patient have  difficulty concentrating, remembering, or making decisions?: No Patient able to express need for  assistance with ADLs?: Yes Does the patient have difficulty dressing or bathing?: No Independently performs ADLs?: Yes (appropriate for developmental age) Does the patient have difficulty walking or climbing stairs?: No Weakness of Legs: None Weakness of Arms/Hands: None  Home Assistive Devices/Equipment Home Assistive Devices/Equipment: None  Therapy Consults (therapy consults require a physician order) PT Evaluation Needed: No OT Evalulation Needed: No SLP Evaluation Needed: No Abuse/Neglect Assessment (Assessment to be complete while patient is alone) Physical Abuse: Denies Verbal Abuse: Denies Sexual Abuse: Denies Exploitation of patient/patient's resources: Denies Self-Neglect: Denies Values / Beliefs Cultural Requests During Hospitalization: None Spiritual Requests During Hospitalization: None Consults Spiritual Care Consult Needed: No Social Work Consult Needed: No Regulatory affairs officer (For Healthcare) Does Patient Have a Medical Advance Directive?: No Would patient like information on creating a medical advance directive?: No - Patient declined    Additional Information 1:1 In Past 12 Months?: No CIRT Risk: Yes Elopement Risk: Yes Does patient have medical clearance?: Yes     Disposition: Case was staffed with Romilda Garret FNP who recommended patient be monitored and observed for safety. Patient will be seen by psychiatry in the a.m.    Disposition Initial Assessment Completed for this Encounter: Yes Disposition of Patient: Other dispositions Other disposition(s): Other (Comment)(Pt will be observed and monitored)  On Site Evaluation by:   Reviewed with Physician:    Mamie Nick 01/24/2018 3:52 PM

## 2018-01-24 NOTE — ED Notes (Signed)
Pt has stayed in bed all day sleeping and eating. She soiled her bed and took the sheets off, threw them into the corner and demanded that her bed be changed. She took a shower, came out yelling at everyone using foul language, demanding that her sheets be changed.

## 2018-01-24 NOTE — ED Notes (Signed)
SBAR Report received from previous nurse. Pt received irritated and loud. Pt ignored writers questions related to current SI/ HI, A/V H, depression, anxiety, or pain at this time, and appears otherwise stable and free of distress and went right to sleep. Pt reminded of camera surveillance, q 15 min rounds, and rules of the milieu. Will continue to assess.

## 2018-01-25 ENCOUNTER — Encounter (HOSPITAL_COMMUNITY): Payer: Self-pay | Admitting: Emergency Medicine

## 2018-01-25 DIAGNOSIS — F1410 Cocaine abuse, uncomplicated: Secondary | ICD-10-CM

## 2018-01-25 DIAGNOSIS — Z818 Family history of other mental and behavioral disorders: Secondary | ICD-10-CM

## 2018-01-25 DIAGNOSIS — R45851 Suicidal ideations: Secondary | ICD-10-CM

## 2018-01-25 DIAGNOSIS — Z813 Family history of other psychoactive substance abuse and dependence: Secondary | ICD-10-CM

## 2018-01-25 DIAGNOSIS — F17210 Nicotine dependence, cigarettes, uncomplicated: Secondary | ICD-10-CM

## 2018-01-25 DIAGNOSIS — F332 Major depressive disorder, recurrent severe without psychotic features: Secondary | ICD-10-CM

## 2018-01-25 DIAGNOSIS — Z79899 Other long term (current) drug therapy: Secondary | ICD-10-CM

## 2018-01-25 DIAGNOSIS — F1210 Cannabis abuse, uncomplicated: Secondary | ICD-10-CM

## 2018-01-25 MED ORDER — STERILE WATER FOR INJECTION IJ SOLN
INTRAMUSCULAR | Status: AC
  Administered 2018-01-25: 18:00:00
  Filled 2018-01-25: qty 10

## 2018-01-25 MED ORDER — DIPHENHYDRAMINE HCL 25 MG PO CAPS
25.0000 mg | ORAL_CAPSULE | Freq: Once | ORAL | Status: AC
  Administered 2018-01-25: 25 mg via ORAL
  Filled 2018-01-25: qty 1

## 2018-01-25 MED ORDER — DIPHENHYDRAMINE HCL 50 MG/ML IJ SOLN
25.0000 mg | Freq: Once | INTRAMUSCULAR | Status: AC | PRN
  Administered 2018-01-25: 25 mg via INTRAMUSCULAR
  Filled 2018-01-25: qty 1

## 2018-01-25 MED ORDER — ZIPRASIDONE MESYLATE 20 MG IM SOLR
10.0000 mg | Freq: Once | INTRAMUSCULAR | Status: AC | PRN
  Administered 2018-01-25: 10 mg via INTRAMUSCULAR
  Filled 2018-01-25: qty 20

## 2018-01-25 NOTE — Consult Note (Signed)
[facility] Face-to-Face Psychiatry Consult   Reason for Consult:  Suicidal ideation Referring Physician:  EDP Patient Identification: Deborah Melendez MRN:  [id number] Principal Diagnosis: MDD (major depressive disorder), recurrent severe, without psychosis ([place]) Diagnosis:   Patient Active Problem List   Diagnosis Date Noted  . Manic behavior ([place]) [F30.10]   . Bipolar 2 disorder, major depressive episode ([place]) [F31.81] 10/08/2017  . UTI (urinary tract infection) [N39.0] 10/08/2017  . Cocaine abuse with cocaine-induced mood disorder ([place]) [F14.14] 07/27/2017  . Tobacco use disorder [F17.200] 05/22/2017  . Major depressive disorder, recurrent severe without psychotic features ([place]) [F33.2] 05/22/2017  . Homicidal ideations [R45.850]   . Suicidal ideation [R45.851]   . Bipolar 1 disorder, depressed, severe ([place]) [F31.4] 02/04/2017  . 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 Cannabis use disorder, severe, dependence ([place]) [F12.20] 01/20/2017  . MDD (major depressive disorder), recurrent severe, without psychosis ([place]) [F33.2] 08/08/2015  . Cocaine use disorder, severe, dependence ([place]) [F14.20] 08/08/2015    Total Time spent with patient: 30 minutes  Subjective:   Deborah Melendez is a 42 y.o. female patient admitted with suicidal ideation.  HPI:  Pt was seen and chart reviewed with treatment team and Dr Mariea Clonts. Pt presented to the [facility] under IVC due to being suicidal while "high on drugs." Pt stated she is having auditory and visual hallucinations. Pt has a history of Bipolar disorder and a history of drug abuse. Pt's UDS positive for cocaine and THC. Pt stated she feels manic and wants to get help for her drug use and her bipolar. Pt would benefit from an inpatient psychiatric admission for crisis stabilization and medication management.   Past Psychiatric History: As above  Risk to Self: Suicidal Ideation:  Yes-Currently Present Suicidal Intent: No Is patient at risk for suicide?: Yes Suicidal Plan?: No Access to Means: No What has been your use of drugs/alcohol within the last 12 months?: Current use How many times?: 2 Other Self Harm Risks: (NA) Triggers for Past Attempts: Unknown Intentional Self Injurious Behavior: None Risk to Others: Homicidal Ideation: No Thoughts of Harm to Others: No Current Homicidal Intent: No Current Homicidal Plan: No Access to Homicidal Means: No Identified Victim: NA History of harm to others?: No Assessment of Violence: None Noted Violent Behavior Description: NA Does patient have access to weapons?: No Criminal Charges Pending?: No Does patient have a court date: No Prior Inpatient Therapy: Prior Inpatient Therapy: Yes Prior Therapy Dates: 2018 Prior Therapy Facilty/Provider(s): [facility] Reason for Treatment: SA issues, MH issues Prior Outpatient Therapy: Prior Outpatient Therapy: No Prior Therapy Dates: NA Prior Therapy Facilty/Provider(s): NA Reason for Treatment: NA Does patient have an ACCT team?: No Does patient have Intensive In-House Services?  : No Does patient have [facility] services? : Unknown Does patient have P4CC services?: No  Past Medical History:  Past Medical History:  Diagnosis Date  . Depression   . MRSA (methicillin resistant Staphylococcus aureus)    surgery on finger 3 years ago  . Substance or medication-induced bipolar and related disorder with onset during intoxication ([place]) 09/08/2016    Past Surgical History:  Procedure Laterality Date  . FINGER SURGERY     Family History:  Family History  Problem Relation Age of Onset  . Diabetes Mother   . Hypertension Mother   . Drug abuse Father   . Schizophrenia Maternal Aunt    Family Psychiatric  History: Unknown Social History:  Social History   Substance and Sexual  Activity  Alcohol Use Yes   Comment: occasional      Social History   Substance and Sexual  Activity  Drug Use Yes  . Types: Cocaine   Comment: last used 3-4 days ago.    Social History   Socioeconomic History  . Marital status: Single    Spouse name: None  . Number of children: None  . Years of education: None  . Highest education level: None  Social Needs  . Financial resource strain: None  . Food insecurity - worry: None  . Food insecurity - inability: None  . Transportation needs - medical: None  . Transportation needs - non-medical: None  Occupational History  . None  Tobacco Use  . Smoking status: Current Every Day Smoker    Packs/day: 0.50    Types: Cigarettes  . Smokeless tobacco: Never Used  . Tobacco comment: pt reported quiting three weeks ago  Substance and Sexual Activity  . Alcohol use: Yes    Comment: occasional   . Drug use: Yes    Types: Cocaine    Comment: last used 3-4 days ago.  Marland Kitchen Sexual activity: Yes    Birth control/protection: None  Other Topics Concern  . None  Social History Narrative  . None   Additional Social History: N/A    Allergies:  No Known Allergies  Labs: No results found for this or any previous visit (from the past 48 hour(s)).  Current Facility-Administered Medications  Medication Dose Route Frequency Provider Last Rate Last Dose  . diphenhydrAMINE (BENADRYL) capsule 25 mg  25 mg Oral Once Faythe Dingwall, DO      . divalproex (DEPAKOTE) DR tablet 750 mg  750 mg Oral Q12H Pisciotta, Nicole, PA-C   750 mg at 01/25/18 0935  . ferrous sulfate tablet 325 mg  325 mg Oral TID WC Pisciotta, Nicole, PA-C   325 mg at 01/25/18 0835  . FLUoxetine (PROZAC) capsule 20 mg  20 mg Oral Daily Pisciotta, Nicole, PA-C   20 mg at 01/25/18 0935  . gabapentin (NEURONTIN) capsule 600 mg  600 mg Oral TID Pisciotta, Nicole, PA-C   600 mg at 01/25/18 0935  . LORazepam (ATIVAN) tablet 1 mg  1 mg Oral BID Pisciotta, Nicole, PA-C   1 mg at 01/25/18 0935  . potassium chloride SA (K-DUR,KLOR-CON) CR tablet 40 mEq  40 mEq Oral Once  Pisciotta, Nicole, PA-C      . QUEtiapine (SEROQUEL) tablet 50 mg  50 mg Oral TID Pisciotta, Nicole, PA-C   50 mg at 01/25/18 1317  . QUEtiapine (SEROQUEL) tablet 600 mg  600 mg Oral QHS Pisciotta, Nicole, PA-C   600 mg at 01/24/18 2132  . sertraline (ZOLOFT) tablet 50 mg  50 mg Oral Daily Pisciotta, Elmyra Ricks, PA-C   50 mg at 01/25/18 7106   Current Outpatient Medications  Medication Sig Dispense Refill  . diphenhydrAMINE (BENADRYL) 25 mg capsule Take 1 capsule (25 mg total) by mouth every 6 (six) hours as needed. 30 capsule 0  . divalproex (DEPAKOTE) 250 MG DR tablet Take 3 tablets (750 mg total) by mouth every 12 (twelve) hours. 180 tablet 0  . divalproex (DEPAKOTE) 250 MG DR tablet Take 1 tablet (250 mg total) by mouth 3 (three) times daily. 60 tablet 2  . ferrous sulfate 325 (65 FE) MG tablet Take 1 tablet (325 mg total) by mouth 3 (three) times daily with meals. 90 tablet 0  . FLUoxetine (PROZAC) 20 MG capsule Take 1 capsule (20 mg  total) by mouth daily. 30 capsule 2  . gabapentin (NEURONTIN) 600 MG tablet Take 1 tablet (600 mg total) by mouth 3 (three) times daily. 90 tablet 0  . hydrOXYzine (ATARAX/VISTARIL) 25 MG tablet Take 1 tablet (25 mg total) by mouth every 6 (six) hours. 12 tablet 0  . LORazepam (ATIVAN) 1 MG tablet Take 1 tablet (1 mg total) by mouth 2 (two) times daily. 2 tablet 0  . QUEtiapine (SEROQUEL) 300 MG tablet Take 2 tablets (600 mg total) by mouth at bedtime. 60 tablet 0  . QUEtiapine (SEROQUEL) 50 MG tablet Take 1 tablet (50 mg total) by mouth 3 (three) times daily. 90 tablet 0  . sertraline (ZOLOFT) 50 MG tablet Take 1 tablet (50 mg total) by mouth daily. 30 tablet 0    Musculoskeletal: Strength & Muscle Tone: within normal limits Gait & Station: normal Patient leans: N/A  Psychiatric Specialty Exam: Physical Exam  Nursing note and vitals reviewed. Constitutional: She is oriented to person, place, and time. She appears well-developed and well-nourished.  HENT:   Head: Normocephalic.  Respiratory: Effort normal.  Musculoskeletal: Normal range of motion.  Neurological: She is alert and oriented to person, place, and time.  Psychiatric: Her mood appears anxious. Her speech is rapid and/or pressured and slurred. She is actively hallucinating. Cognition and memory are impaired. She expresses impulsivity. She exhibits a depressed mood. She expresses suicidal ideation. She expresses suicidal plans.    Review of Systems  Psychiatric/Behavioral: Positive for depression, hallucinations, substance abuse and suicidal ideas. Negative for memory loss. The patient is nervous/anxious. The patient does not have insomnia.   All other systems reviewed and are negative.   Blood pressure (!) 129/96, pulse 100, temperature 97.9 F (36.6 C), resp. rate 20, last menstrual period 01/19/2018, SpO2 100 %.There is no height or weight on file to calculate BMI.  General Appearance: Disheveled  Eye Contact:  Fair  Speech:  Garbled and Slurred  Volume:  Normal  Mood:  Anxious and Depressed  Affect:  Congruent and Depressed  Thought Process:  Coherent  Orientation:  Full (Time, Place, and Person)  Thought Content:  Logical  Suicidal Thoughts:  Yes.  without intent/plan  Homicidal Thoughts:  No  Memory:  Immediate;   Good Recent;   Good Remote;   Fair  Judgement:  Poor  Insight:  Lacking  Psychomotor Activity:  Normal  Concentration:  Concentration: Good and Attention Span: Good  Recall:  Good  Fund of Knowledge:  Good  Language:  Good  Akathisia:  No  Handed:  Right  AIMS (if indicated):   N/A  Assets:  Communication Skills Desire for Improvement Financial Resources/Insurance Housing  ADL's:  Intact  Cognition:  WNL  Sleep:    N/A     Treatment Plan Summary: Daily contact with patient to assess and evaluate symptoms and progress in treatment and Medication management  Disposition: Recommend psychiatric Inpatient admission when medically cleared. Pt has  been accepted to [facility], bed 301-1   Ethelene Hal, NP 01/25/2018 2:30 PM   Patient seen face-to-face for psychiatric evaluation, chart reviewed and case discussed with the physician extender and developed treatment plan. Reviewed the information documented and agree with the treatment plan.  Buford Dresser, DO 01/25/18 3:55 PM

## 2018-01-25 NOTE — ED Notes (Signed)
Pt speech still a little garbled, pt reports she "feel better" Pt able to eat and drink without difficulty, Laurie,NP notified. Awaiting orders.

## 2018-01-25 NOTE — ED Notes (Signed)
Pt up at nurses station, speech clear, loud, verbally aggressive, verbally threatening, screaming racial slurs, security and [employer] present. Margarita Grizzle, NP aware.

## 2018-01-25 NOTE — ED Notes (Signed)
Patient is resting comfortably. 

## 2018-01-25 NOTE — BH Assessment (Addendum)
[facility] Assessment Progress Note  Per Buford Dresser, DO, this pt requires psychiatric hospitalization at this time.  Leonia Reader, RN, [facility] has assigned pt to [facility] Rm 301-1;  [facility] will be ready to receive pt at 16:30  Pt presents under IVC initiated by EDP Milton Ferguson, MD, and IVC documents have been faxed to [facility]  Pt's nurse, Caryl Pina, has been notified, and agrees to call report to [phone].  Pt is to be transported via [facility].   Jalene Mullet, [place] Coordinator [phone]

## 2018-01-25 NOTE — ED Notes (Addendum)
Pt endorsing AH, Reports "I'm talking to my daughter and she is not even in the room." Pt also expressing concern r/t her "voice" This nurse notified Dr.Norman, no new orders given. Encouragement and support provided. Special checks q 15 mins in place for safety, Video monitoring in place. Will continue to monitor.

## 2018-01-25 NOTE — ED Notes (Signed)
Pt irritable, demanding, pt behavior easily redirectable,  encouragement and support provided. Will continue to monitor.

## 2018-01-25 NOTE — ED Notes (Signed)
This nurse spoke with Laddie Aquas at [facility]. Informed pt transfer is currently on hold d/t pt behavior at this time.

## 2018-01-25 NOTE — ED Notes (Signed)
Patient denies SI/HI.AVH. Plan of care discussed. Encouragement and support provided and safety maintain. Q 15 min safety checks in place and video monitoring.  

## 2018-01-26 ENCOUNTER — Encounter: Payer: Self-pay | Admitting: Psychiatry

## 2018-01-26 ENCOUNTER — Inpatient Hospital Stay
Admission: AD | Admit: 2018-01-26 | Discharge: 2018-01-31 | DRG: 885 | Disposition: A | Discharge status: Home / Self Care | Payer: No Typology Code available for payment source | Attending: Psychiatry | Admitting: Psychiatry

## 2018-01-26 ENCOUNTER — Inpatient Hospital Stay (HOSPITAL_COMMUNITY)
Admission: AD | Admit: 2018-01-26 | Discharge: 2018-01-26 | DRG: 885 | Disposition: A | Discharge status: Other Acute Inpatient Hospital | Payer: Federal, State, Local not specified - Other | Source: Intra-hospital | Attending: Psychiatry | Admitting: Psychiatry

## 2018-01-26 DIAGNOSIS — Z818 Family history of other mental and behavioral disorders: Secondary | ICD-10-CM

## 2018-01-26 DIAGNOSIS — Z7289 Other problems related to lifestyle: Secondary | ICD-10-CM | POA: Diagnosis not present

## 2018-01-26 DIAGNOSIS — F1420 Cocaine dependence, uncomplicated: Secondary | ICD-10-CM | POA: Diagnosis present

## 2018-01-26 DIAGNOSIS — G4700 Insomnia, unspecified: Secondary | ICD-10-CM | POA: Diagnosis present

## 2018-01-26 DIAGNOSIS — R45851 Suicidal ideations: Secondary | ICD-10-CM | POA: Diagnosis present

## 2018-01-26 DIAGNOSIS — Z79899 Other long term (current) drug therapy: Secondary | ICD-10-CM | POA: Diagnosis not present

## 2018-01-26 DIAGNOSIS — Z8614 Personal history of Methicillin resistant Staphylococcus aureus infection: Secondary | ICD-10-CM | POA: Diagnosis not present

## 2018-01-26 DIAGNOSIS — F339 Major depressive disorder, recurrent, unspecified: Secondary | ICD-10-CM | POA: Diagnosis present

## 2018-01-26 DIAGNOSIS — L259 Unspecified contact dermatitis, unspecified cause: Secondary | ICD-10-CM | POA: Diagnosis not present

## 2018-01-26 DIAGNOSIS — F1220 Cannabis dependence, uncomplicated: Secondary | ICD-10-CM | POA: Diagnosis present

## 2018-01-26 DIAGNOSIS — F1290 Cannabis use, unspecified, uncomplicated: Secondary | ICD-10-CM | POA: Diagnosis present

## 2018-01-26 DIAGNOSIS — F17210 Nicotine dependence, cigarettes, uncomplicated: Secondary | ICD-10-CM | POA: Diagnosis present

## 2018-01-26 DIAGNOSIS — F333 Major depressive disorder, recurrent, severe with psychotic symptoms: Secondary | ICD-10-CM | POA: Diagnosis present

## 2018-01-26 DIAGNOSIS — Z9114 Patient's other noncompliance with medication regimen: Secondary | ICD-10-CM | POA: Diagnosis not present

## 2018-01-26 DIAGNOSIS — F17200 Nicotine dependence, unspecified, uncomplicated: Secondary | ICD-10-CM | POA: Diagnosis present

## 2018-01-26 HISTORY — DX: Major depressive disorder, recurrent severe without psychotic features: F33.2

## 2018-01-26 HISTORY — DX: Manic episode without psychotic symptoms, unspecified: F30.10

## 2018-01-26 HISTORY — DX: Bipolar disorder, current episode depressed, severe, without psychotic features: F31.4

## 2018-01-26 HISTORY — DX: Homicidal ideations: R45.850

## 2018-01-26 HISTORY — DX: Cocaine abuse with cocaine-induced mood disorder: F14.14

## 2018-01-26 HISTORY — DX: Suicidal ideations: R45.851

## 2018-01-26 MED ORDER — FERROUS SULFATE 325 (65 FE) MG PO TABS
325.0000 mg | ORAL_TABLET | Freq: Three times a day (TID) | ORAL | Status: DC
  Administered 2018-01-27 – 2018-01-31 (×12): 325 mg via ORAL
  Filled 2018-01-26 (×12): qty 1

## 2018-01-26 MED ORDER — GABAPENTIN 300 MG PO CAPS
600.0000 mg | ORAL_CAPSULE | Freq: Three times a day (TID) | ORAL | Status: DC
  Administered 2018-01-27 – 2018-01-31 (×12): 600 mg via ORAL
  Filled 2018-01-26 (×12): qty 2

## 2018-01-26 MED ORDER — FLUOXETINE HCL 20 MG PO CAPS
20.0000 mg | ORAL_CAPSULE | Freq: Every day | ORAL | Status: DC
  Administered 2018-01-27: 20 mg via ORAL
  Filled 2018-01-26: qty 1

## 2018-01-26 MED ORDER — ACETAMINOPHEN 325 MG PO TABS
650.0000 mg | ORAL_TABLET | Freq: Four times a day (QID) | ORAL | Status: DC | PRN
  Administered 2018-01-27: 650 mg via ORAL
  Filled 2018-01-26: qty 2

## 2018-01-26 MED ORDER — QUETIAPINE FUMARATE 200 MG PO TABS: 600.0000 mg | ORAL_TABLET | Freq: Every day | ORAL | Status: DC

## 2018-01-26 MED ORDER — MAGNESIUM HYDROXIDE 400 MG/5ML PO SUSP: 30.0000 mL | Freq: Every day | ORAL | Status: DC | PRN

## 2018-01-26 MED ORDER — QUETIAPINE FUMARATE 25 MG PO TABS
50.0000 mg | ORAL_TABLET | Freq: Three times a day (TID) | ORAL | Status: DC
  Administered 2018-01-27: 50 mg via ORAL
  Filled 2018-01-26: qty 2

## 2018-01-26 MED ORDER — LORAZEPAM 1 MG PO TABS
1.0000 mg | ORAL_TABLET | Freq: Two times a day (BID) | ORAL | Status: DC
  Administered 2018-01-27: 1 mg via ORAL
  Filled 2018-01-26: qty 1

## 2018-01-26 MED ORDER — DIVALPROEX SODIUM 500 MG PO DR TAB
750.0000 mg | DELAYED_RELEASE_TABLET | Freq: Two times a day (BID) | ORAL | Status: DC
  Administered 2018-01-27: 750 mg via ORAL
  Filled 2018-01-26: qty 1

## 2018-01-26 MED ORDER — ALUM & MAG HYDROXIDE-SIMETH 200-200-20 MG/5ML PO SUSP: 30.0000 mL | ORAL | Status: DC | PRN

## 2018-01-26 MED ORDER — TRAZODONE HCL 50 MG PO TABS
50.0000 mg | ORAL_TABLET | Freq: Every evening | ORAL | Status: DC | PRN
  Administered 2018-01-27 – 2018-01-30 (×3): 50 mg via ORAL
  Filled 2018-01-26 (×3): qty 1

## 2018-01-26 MED ORDER — SERTRALINE HCL 25 MG PO TABS
50.0000 mg | ORAL_TABLET | Freq: Every day | ORAL | Status: DC
  Administered 2018-01-27: 50 mg via ORAL
  Filled 2018-01-26: qty 2

## 2018-01-26 MED ORDER — HYDROXYZINE HCL 25 MG PO TABS
25.0000 mg | ORAL_TABLET | Freq: Three times a day (TID) | ORAL | Status: DC | PRN
  Administered 2018-01-27 – 2018-01-28 (×3): 25 mg via ORAL
  Filled 2018-01-26 (×3): qty 1

## 2018-01-26 NOTE — ED Notes (Signed)
Deborah Melendez at [facility] contacted and is aware that the pt will not transport until approx 2100-2200 tonight

## 2018-01-26 NOTE — ED Notes (Signed)
Attempted to call report at [facility] nurse unable to take at this time. Stated that he would call back.

## 2018-01-26 NOTE — ED Notes (Signed)
No signature required patient IVC'd.

## 2018-01-26 NOTE — BH Assessment (Signed)
[facility] Assessment Progress Note  Per Buford Dresser, DO, this pt continues to require psychiatric hospitalization at this time.  Pt presents under IVC initiated by EDP Milton Ferguson, MD.  The following facilities have been contacted to seek placement for this pt, with results as noted:  Beds available, information sent, decision pending:  [facility]   At capacity:  [facility] [place], [place] Coordinator [phone]

## 2018-01-26 NOTE — Progress Notes (Signed)
Patient ID: Deborah Melendez, female   DOB: [birth year], 42 y.o.   MRN: [id number]  This writer visualized patient out in the milieu. She had taken her shower and was combing and fixing her hair. Pt was acting appropriately on the unit at this time.   Ethelene Hal, NP-C 01-26-2018   [phone]

## 2018-01-26 NOTE — Consult Note (Signed)
[facility] Face-to-Face Psychiatry Consult   Reason for Consult:  Suicidal ideation Referring Physician:  EDP Patient Identification: Deborah Melendez MRN:  [id number] Principal Diagnosis: MDD (major depressive disorder), recurrent severe, without psychosis ([place]) Diagnosis:   Patient Active Problem List   Diagnosis Date Noted  . Manic behavior ([place]) [F30.10]   . Bipolar 2 disorder, major depressive episode ([place]) [F31.81] 10/08/2017  . UTI (urinary tract infection) [N39.0] 10/08/2017  . Cocaine abuse with cocaine-induced mood disorder ([place]) [F14.14] 07/27/2017  . Tobacco use disorder [F17.200] 05/22/2017  . Major depressive disorder, recurrent severe without psychotic features ([place]) [F33.2] 05/22/2017  . Homicidal ideations [R45.850]   . Suicidal ideation [R45.851]   . Bipolar 1 disorder, depressed, severe ([place]) [F31.4] 02/04/2017  . 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 Cannabis use disorder, severe, dependence ([place]) [F12.20] 01/20/2017  . MDD (major depressive disorder), recurrent severe, without psychosis ([place]) [F33.2] 08/08/2015  . Cocaine use disorder, severe, dependence ([place]) [F14.20] 08/08/2015    Total Time spent with patient: 30 minutes  Subjective:   Deborah Melendez is a 42 y.o. female patient admitted with suicidal ideation.  HPI:  Pt was seen and chart reviewed with treatment team and Dr Mariea Clonts. Pt was calm and cooperative during assessment today. Pt stated she has dry mouth and feels like she can't talk right and is requesting mouthwash to be ordered. Pt exhibited these symptoms yesterday but when she became upset she was able to speak clearly. Pt stated she feels confused and disoriented and thinks people are in her room. Pt is still endorsing suicidal ideation. Pt is still in need of inpatient hospitalization for crisis stabilization and medication management.   Past Psychiatric History: As  above  Risk to Self: Suicidal Ideation: None currently.  Suicidal Intent: No Is patient at risk for suicide?: Yes Suicidal Plan?: No Access to Means: No What has been your use of drugs/alcohol within the last 12 months?: Current use How many times?: 2 Other Self Harm Risks: (NA) Triggers for Past Attempts: Unknown Intentional Self Injurious Behavior: None Risk to Others: Homicidal Ideation: No Thoughts of Harm to Others: No Current Homicidal Intent: No Current Homicidal Plan: No Access to Homicidal Means: No Identified Victim: NA History of harm to others?: No Assessment of Violence: None Noted Violent Behavior Description: NA Does patient have access to weapons?: No Criminal Charges Pending?: No Does patient have a court date: No Prior Inpatient Therapy: Prior Inpatient Therapy: Yes Prior Therapy Dates: 2018 Prior Therapy Facilty/Provider(s): [facility] Reason for Treatment: SA issues, MH issues Prior Outpatient Therapy: Prior Outpatient Therapy: No Prior Therapy Dates: NA Prior Therapy Facilty/Provider(s): NA Reason for Treatment: NA Does patient have an ACCT team?: No Does patient have Intensive In-House Services?  : No Does patient have [facility] services? : Unknown Does patient have P4CC services?: No  Past Medical History:  Past Medical History:  Diagnosis Date  . Depression   . MRSA (methicillin resistant Staphylococcus aureus)    surgery on finger 3 years ago  . Substance or medication-induced bipolar and related disorder with onset during intoxication ([place]) 09/08/2016    Past Surgical History:  Procedure Laterality Date  . FINGER SURGERY     Family History:  Family History  Problem Relation Age of Onset  . Diabetes Mother   . Hypertension Mother   . Drug abuse Father   . Schizophrenia Maternal Aunt    Family Psychiatric  History: Father-substance abuse and maternal aunt-schizophrenia Social History:  Social History   Substance and Sexual Activity  Alcohol  Use Yes   Comment: occasional      Social History   Substance and Sexual Activity  Drug Use Yes  . Types: Cocaine   Comment: last used 3-4 days ago.    Social History   Socioeconomic History  . Marital status: Single    Spouse name: None  . Number of children: None  . Years of education: None  . Highest education level: None  Social Needs  . Financial resource strain: None  . Food insecurity - worry: None  . Food insecurity - inability: None  . Transportation needs - medical: None  . Transportation needs - non-medical: None  Occupational History  . None  Tobacco Use  . Smoking status: Current Every Day Smoker    Packs/day: 0.50    Types: Cigarettes  . Smokeless tobacco: Never Used  . Tobacco comment: pt reported quiting three weeks ago  Substance and Sexual Activity  . Alcohol use: Yes    Comment: occasional   . Drug use: Yes    Types: Cocaine    Comment: last used 3-4 days ago.  Marland Kitchen Sexual activity: Yes    Birth control/protection: None  Other Topics Concern  . None  Social History Narrative  . None   Additional Social History: N/A    Allergies:  No Known Allergies  Labs: No results found for this or any previous visit (from the past 48 hour(s)).  Current Facility-Administered Medications  Medication Dose Route Frequency Provider Last Rate Last Dose  . divalproex (DEPAKOTE) DR tablet 750 mg  750 mg Oral Q12H Pisciotta, Nicole, PA-C   750 mg at 01/26/18 1108  . ferrous sulfate tablet 325 mg  325 mg Oral TID WC Pisciotta, Nicole, PA-C   325 mg at 01/26/18 0804  . FLUoxetine (PROZAC) capsule 20 mg  20 mg Oral Daily Pisciotta, Nicole, PA-C   20 mg at 01/26/18 1108  . gabapentin (NEURONTIN) capsule 600 mg  600 mg Oral TID Pisciotta, Nicole, PA-C   600 mg at 01/26/18 1109  . LORazepam (ATIVAN) tablet 1 mg  1 mg Oral BID Pisciotta, Nicole, PA-C   1 mg at 01/26/18 1108  . potassium chloride SA (K-DUR,KLOR-CON) CR tablet 40 mEq  40 mEq Oral Once Pisciotta, Nicole,  PA-C      . QUEtiapine (SEROQUEL) tablet 50 mg  50 mg Oral TID Pisciotta, Nicole, PA-C   50 mg at 01/26/18 0804  . QUEtiapine (SEROQUEL) tablet 600 mg  600 mg Oral QHS Pisciotta, Nicole, PA-C   600 mg at 01/25/18 2320  . sertraline (ZOLOFT) tablet 50 mg  50 mg Oral Daily Pisciotta, Elmyra Ricks, PA-C   50 mg at 01/26/18 1107   Current Outpatient Medications  Medication Sig Dispense Refill  . diphenhydrAMINE (BENADRYL) 25 mg capsule Take 1 capsule (25 mg total) by mouth every 6 (six) hours as needed. 30 capsule 0  . divalproex (DEPAKOTE) 250 MG DR tablet Take 3 tablets (750 mg total) by mouth every 12 (twelve) hours. 180 tablet 0  . divalproex (DEPAKOTE) 250 MG DR tablet Take 1 tablet (250 mg total) by mouth 3 (three) times daily. 60 tablet 2  . ferrous sulfate 325 (65 FE) MG tablet Take 1 tablet (325 mg total) by mouth 3 (three) times daily with meals. 90 tablet 0  . FLUoxetine (PROZAC) 20 MG capsule Take 1 capsule (20 mg total) by mouth daily. 30 capsule 2  . gabapentin (NEURONTIN) 600 MG  tablet Take 1 tablet (600 mg total) by mouth 3 (three) times daily. 90 tablet 0  . hydrOXYzine (ATARAX/VISTARIL) 25 MG tablet Take 1 tablet (25 mg total) by mouth every 6 (six) hours. 12 tablet 0  . LORazepam (ATIVAN) 1 MG tablet Take 1 tablet (1 mg total) by mouth 2 (two) times daily. 2 tablet 0  . QUEtiapine (SEROQUEL) 300 MG tablet Take 2 tablets (600 mg total) by mouth at bedtime. 60 tablet 0  . QUEtiapine (SEROQUEL) 50 MG tablet Take 1 tablet (50 mg total) by mouth 3 (three) times daily. 90 tablet 0  . sertraline (ZOLOFT) 50 MG tablet Take 1 tablet (50 mg total) by mouth daily. 30 tablet 0    Musculoskeletal: Strength & Muscle Tone: within normal limits Gait & Station: normal Patient leans: N/A  Psychiatric Specialty Exam: Physical Exam  Nursing note and vitals reviewed. Constitutional: She appears well-developed and well-nourished.  Neck: Normal range of motion.  Cardiovascular: Normal rate.   Respiratory: Effort normal.  Musculoskeletal: Normal range of motion.  Neurological: She is alert.  Psychiatric: Her behavior is normal. Her mood appears anxious. Her speech is rapid and/or pressured. Cognition and memory are normal. She expresses impulsivity. She exhibits a depressed mood. She expresses suicidal ideation.    Review of Systems  Psychiatric/Behavioral: Positive for depression, hallucinations, substance abuse and suicidal ideas. Negative for memory loss. The patient is nervous/anxious. The patient does not have insomnia.   All other systems reviewed and are negative.   Blood pressure 127/85, pulse (!) 105, temperature 97.9 F (36.6 C), resp. rate 18, last menstrual period 01/19/2018, SpO2 99 %.There is no height or weight on file to calculate BMI.  General Appearance: Disheveled  Eye Contact:  Good  Speech:  Garbled  Volume:  Normal  Mood:  Anxious and Depressed  Affect:  Congruent and Depressed  Thought Process:  Coherent and Linear  Orientation:  Full (Time, Place, and Person)  Thought Content:  Logical  Suicidal Thoughts:  No  Homicidal Thoughts:  No  Memory:  Immediate;   Good Recent;   Good Remote;   Fair  Judgement:  Poor  Insight:  Lacking  Psychomotor Activity:  Normal  Concentration:  Concentration: Good and Attention Span: Fair  Recall:  [employer] of Knowledge:  Good  Language:  Good  Akathisia:  No  Handed:  Right  AIMS (if indicated):    N/A  Assets:  Communication Skills Desire for Improvement Financial Resources/Insurance Housing  ADL's:  Intact  Cognition:  WNL  Sleep:    N/A     Treatment Plan Summary: Daily contact with patient to assess and evaluate symptoms and progress in treatment and Medication management (see MAR)  Disposition: Recommend psychiatric Inpatient admission when medically cleared. TTS to seek placement  Ethelene Hal, NP 01/26/2018 12:47 PM   Patient seen face-to-face for psychiatric evaluation, chart  reviewed and case discussed with the physician extender and developed treatment plan. Reviewed the information documented and agree with the treatment plan.  Buford Dresser, DO 01/26/18 7:36 PM

## 2018-01-26 NOTE — ED Notes (Signed)
Up to the bathroom 

## 2018-01-26 NOTE — ED Notes (Signed)
Patient transported to [facility], by [facility] Department, for continuation of specialized care. Belongings signed for and given to deputy. Pt left in no acute distress.

## 2018-01-26 NOTE — ED Notes (Signed)
Pt up to the desk during report, irritable, demanding, but redirectable to her room.

## 2018-01-26 NOTE — ED Notes (Signed)
Pt has been accepted to [facility], sheriff is here and is aware and will transport tonight.

## 2018-01-26 NOTE — BH Assessment (Signed)
Patient has been accepted to [facility]  Patient assigned to room 12 Accepting physician is Dr. Wonda Olds.  Call report to [phone].  Representative was AC-Lindsay.

## 2018-01-26 NOTE — ED Notes (Signed)
Up asking about supper, nad

## 2018-01-27 ENCOUNTER — Other Ambulatory Visit: Payer: Self-pay

## 2018-01-27 DIAGNOSIS — F339 Major depressive disorder, recurrent, unspecified: Principal | ICD-10-CM

## 2018-01-27 MED ORDER — QUETIAPINE FUMARATE 100 MG PO TABS
100.0000 mg | ORAL_TABLET | Freq: Every day | ORAL | Status: DC
  Administered 2018-01-27: 100 mg via ORAL
  Filled 2018-01-27: qty 1

## 2018-01-27 MED ORDER — QUETIAPINE FUMARATE 25 MG PO TABS
50.0000 mg | ORAL_TABLET | Freq: Two times a day (BID) | ORAL | Status: DC
  Administered 2018-01-27 – 2018-01-28 (×2): 50 mg via ORAL
  Filled 2018-01-27 (×2): qty 2

## 2018-01-27 NOTE — Progress Notes (Signed)
Recreation Therapy Notes  INPATIENT RECREATION THERAPY ASSESSMENT  Patient Details Name: Deborah Melendez MRN: [id number] DOB: 01-Nov-1976 Today's Date: 01/27/2018       Information Obtained From: Patient  Able to Participate in Assessment/Interview: Yes  Patient Presentation: Responsive  Reason for Admission (Per Patient): Suicidal Ideation  Patient Stressors: Family, Relationship, Other (Comment)(Relapse)  Coping Skills:   Music, Exercise  Leisure Interests (2+):  Social - Family  Frequency of Recreation/Participation: Weekly  Awareness of Community Resources:  No  Community Resources:     Current Use:    If no, Barriers?:    Expressed Interest in [employer] Information: No  Patient Main Form of Transportation: Musician  Patient Strengths:  I am unsure  Patient Identified Areas of Improvement:  Improve my self-esteem  Current Recreation Participation:  Walking  Patient Goal for Hospitalization:  Look into employment around the area  [place] of Residence:  [place] of Residence:  [place]  Current SI (including self-harm):  Yes  Current HI:  No  Current AVH: No  Staff Intervention Plan: Group Attendance, Collaborate with Interdisciplinary Treatment Team  Consent to Intern Participation: N/A  [name] 01/27/2018, 11:42 AM

## 2018-01-27 NOTE — BHH Suicide Risk Assessment (Signed)
[facility] Admission Suicide Risk Assessment   Nursing information obtained from:  Patient Demographic factors:  Low socioeconomic status Current Mental Status:  Suicidal ideation indicated by patient Loss Factors:  NA Historical Factors:  Prior suicide attempts Risk Reduction Factors:  Living with another person, especially a relative  Total Time spent with patient: 45 minutes Principal Problem: Major depressive disorder, recurrent episode with mixed features ([place]) Diagnosis:   Patient Active Problem List   Diagnosis Date Noted  . Major depressive disorder, recurrent episode with mixed features ([place]) [F33.9] 01/27/2018    Priority: High  . Cocaine use disorder, severe, dependence ([place]) [F14.20] 08/08/2015    Priority: High  . Tobacco use disorder [F17.200] 05/22/2017  . Cannabis use disorder, severe, dependence ([place]) [F12.20] 01/20/2017   Subjective Data: See H&P  Continued Clinical Symptoms:  Alcohol Use Disorder Identification Test Final Score (AUDIT): 13 The "Alcohol Use Disorders Identification Test", Guidelines for Use in Primary Care, Second Edition.  World Pharmacologist [facility]). Score between 0-7:  no or low risk or alcohol related problems. Score between 8-15:  moderate risk of alcohol related problems. Score between 16-19:  high risk of alcohol related problems. Score 20 or above:  warrants further diagnostic evaluation for alcohol dependence and treatment.   CLINICAL FACTORS:   Alcohol/Substance Abuse/Dependencies        COGNITIVE FEATURES THAT CONTRIBUTE TO RISK:  None    SUICIDE RISK:   Moderate:  Frequent suicidal ideation with limited intensity, and duration, some specificity in terms of plans, no associated intent, good self-control, limited dysphoria/symptomatology, some risk factors present, and identifiable protective factors, including available and accessible social support.  PLAN OF CARE: See H&P  I certify that inpatient services furnished can  reasonably be expected to improve the patient's condition.   Marylin Crosby, MD 01/27/2018, 1:34 PM

## 2018-01-27 NOTE — Progress Notes (Signed)
Recreation Therapy Notes  Date: 01.31.2018  Time: 3:00pm  Location: Craft room  Behavioral response: N/A  Group Type: Craft  Participation level: N/A  Communication: Patient did not attend group.   Comments: N/A  Peightyn Roberson LRT/CTRS        Coraleigh Sheeran 01/27/2018 4:19 PM
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
## 2018-01-27 NOTE — Plan of Care (Signed)
  Not Progressing- New Admit Pain Managment: General experience of comfort will improve 01/27/2018 0057 - Not Progressing by Derek Mound, RN Activity: Interest or engagement in leisure activities will improve 01/27/2018 0057 - Not Progressing by Derek Mound, RN Imbalance in normal sleep/wake cycle will improve 01/27/2018 0057 - Not Progressing by Derek Mound, RN Education: Utilization of techniques to improve thought processes will improve 01/27/2018 0057 - Not Progressing by Derek Mound, RN Knowledge of the prescribed therapeutic regimen will improve 01/27/2018 0057 - Not Progressing by Derek Mound, RN Coping: Ability to cope will improve 01/27/2018 0057 - Not Progressing by Derek Mound, RN Ability to verbalize feelings will improve 01/27/2018 0057 - Not Progressing by Derek Mound, RN Health Behavior/Discharge Planning: Ability to make decisions will improve 01/27/2018 0057 - Not Progressing by Derek Mound, RN Compliance with therapeutic regimen will improve 01/27/2018 0057 - Not Progressing by Derek Mound, RN Safety: Ability to disclose and discuss suicidal ideas will improve 01/27/2018 0057 - Not Progressing by Derek Mound, RN Ability to identify and utilize support systems that promote safety will improve 01/27/2018 0057 - Not Progressing by Derek Mound, RN Self-Concept: Ability to verbalize positive feelings about self will improve 01/27/2018 0057 - Not Progressing by Derek Mound, RN Level of anxiety will decrease 01/27/2018 0057 - Not Progressing by Derek Mound, RN Health Behavior/Discharge Planning: Identification of resources available to assist in meeting health care needs will improve 01/27/2018 0057 - Not Progressing by Derek Mound, RN Medication: Compliance with prescribed medication regimen will improve 01/27/2018 0057 - Not Progressing by Derek Mound, RN Self-Concept: Ability to disclose and discuss suicidal ideas  will improve 01/27/2018 0057 - Not Progressing by Derek Mound, RN Ability to verbalize positive feelings about self will improve 01/27/2018 0057 - Not Progressing by Derek Mound, RN Education: Will be free of psychotic symptoms 01/27/2018 0057 - Not Progressing by Derek Mound, RN

## 2018-01-27 NOTE — Progress Notes (Signed)
Received Deborah Melendez this am asleep in her bed, sahe was awaken to eat breakfast and returned to bed without taking her medications. She was sleeping hard and unable to arouse her for AM medications. She eventually took her morning medications at 1130 hrs in her room. She continues to sleep. She was OOB for dinner and more alert.

## 2018-01-27 NOTE — BHH Group Notes (Signed)
[facility] Group Notes:  (Nursing/MHT/Case Management/Adjunct)  Date:  01/27/2018  Time:  2:28 PM  Type of Therapy:  Psychoeducational Skills  Participation Level:  Did Not Attend  Adela Lank [facility] 01/27/2018, 2:28 PM

## 2018-01-27 NOTE — Tx Team (Signed)
Initial Treatment Plan 01/27/2018 12:54 AM Deborah Melendez YEM:[id number]    PATIENT STRESSORS: Financial difficulties Substance abuse   PATIENT STRENGTHS: Ability for insight Average or above average intelligence Capable of independent living Communication skills Supportive family/friends   PATIENT IDENTIFIED PROBLEMS: Suicidal ideation 01/26/2018  Depression 01/26/2018  Auditory Hallucinations 01/26/2018  Visual Hallucinations 01/26/2018               DISCHARGE CRITERIA:  Ability to meet basic life and health needs Adequate post-discharge living arrangements Improved stabilization in mood, thinking, and/or behavior Motivation to continue treatment in a less acute level of care  PRELIMINARY DISCHARGE PLAN: Outpatient therapy Return to previous living arrangement  PATIENT/FAMILY INVOLVEMENT: This treatment plan has been presented to and reviewed with the patient, Deborah Melendez, and/or family member.  The patient and family have been given the opportunity to ask questions and make suggestions.  Derek Mound, RN 01/27/2018, 12:54 AM

## 2018-01-27 NOTE — BHH Group Notes (Signed)
LCSW Group Therapy Note 01/27/2018 9:00 AM  Type of Therapy and Topic:  Group Therapy:  Setting Goals  Participation Level:  Did Not Attend  Description of Group: In this process group, patients discussed using strengths to work toward goals and address challenges.  Patients identified two positive things about themselves and one goal they were working on.  Patients were given the opportunity to share openly and support each other's plan for self-empowerment.  The group discussed the value of gratitude and were encouraged to have a daily reflection of positive characteristics or circumstances.  Patients were encouraged to identify a plan to utilize their strengths to work on current challenges and goals.  Therapeutic Goals 1. Patient will verbalize personal strengths/positive qualities and relate how these can assist with achieving desired personal goals 2. Patients will verbalize affirmation of peers plans for personal change and goal setting 3. Patients will explore the value of gratitude and positive focus as related to successful achievement of goals 4. Patients will verbalize a plan for regular reinforcement of personal positive qualities and circumstances.  Summary of Patient Progress:       Therapeutic Modalities Cognitive Behavioral Therapy Motivational Interviewing    Devona Konig, LCSW 01/27/2018 10:51 AM

## 2018-01-27 NOTE — BHH Group Notes (Signed)
[place] Group Notes:  (Nursing/MHT/Case Management/Adjunct)  Date:  01/27/2018  Time:  9:52 PM  Type of Therapy:  Group Therapy  Participation Level:  Active  Participation Quality:  Appropriate  Affect:  Appropriate  Cognitive:  Appropriate  Insight:  Appropriate  Engagement in Group:  Engaged  Modes of Intervention:  Discussion  Summary of Progress/Problems:  Deborah Melendez 01/27/2018, 9:52 PM

## 2018-01-27 NOTE — Progress Notes (Signed)
Recreation Therapy Notes   Date: 01.31.2019  Time: 1:00pm   Location: Craft Room  Behavioral response: N/A  Intervention Topic: Communication  Discussion/Intervention: Patient did not attend group. Clinical Observations/Feedback:  Patient did not attend group.  Laysa Kimmey LRT/CTRS          Wana Mount 01/27/2018 2:01 PM

## 2018-01-27 NOTE — Progress Notes (Addendum)
42 year old black female admitted IVC from [facility] with [place] for SI and AVH. Patient arrived irritable and minimally cooperative. Reports feeling suicidal due to voices and visual hallucinations. "I've never seen stuff like this before." Patient will not elaborate on the voices but states she just sees "stuff." Reports SI with no plan or intent. Reports previous attempt via cutting her wrists. Contracts for safety while on the unit. Reports smoking crack cocaine once a week in unknown quantities, last use 01/22/2018. Reports smoking marijuana occasionally, last use unknown. Reports drinking once a week, a bottle of vodka, last use 01/22/2018. Denies S/Sx of WD. UDS +THC and cocaine. Reports smoking a pack of cigarettes per day. Reports quitting 3 weeks ago. Patient is poor historian as she is irritable and only minimally compliant with assessment. Patient refused to review her PTA medications with the writer. "I'm tired. I don't feel like doing this with you." Compliant with skin assessment. Patient skin is acne scarred over her face and body. Patient has abrasion to her right face near the mouth. Appearance is disheveled and dirty. Patient is currently on her menses and was provided clean underwear and pads. Refused HS medications stating that she was tired. Denies HI. Denies pain. Belongings inventoried. No contraband found. Patient reoriented to unit and forms signed. Q 15 minute checks initiated. Will continue to monitor throughout the shift. Patient awoke at 0600, complains of anxiety. Given Vistaril PRN with slight relief reported. Patient remains irritable and short with staff but compliant. Will endorse care to oncoming shift.

## 2018-01-27 NOTE — H&P (Addendum)
Psychiatric Admission Assessment Adult  Patient Identification: Deborah Melendez MRN:  [id number] Date of Evaluation:  01/27/2018 Chief Complaint:  Depression Principal Diagnosis: Major depressive disorder, recurrent episode with mixed features ([place]) Diagnosis:   Patient Active Problem List   Diagnosis Date Noted  . Major depressive disorder, recurrent episode with mixed features ([place]) [F33.9] 01/27/2018    Priority: High  . Cocaine use disorder, severe, dependence ([place]) [F14.20] 08/08/2015    Priority: High  . Tobacco use disorder [F17.200] 05/22/2017  . Cannabis use disorder, severe, dependence ([place]) [F12.20] 01/20/2017   History of Present Illness: 42 yo female admitted due to depression and SI in the context of relapse on crack cocaine. Pt is known to this provider from last hospitalization in October 2018 where she was admitted for almost exact same presentation. She has history of hospitalization with similar symptoms and long standing issues with her boyfriend. Pt states that after discharge she was doing very well. She did not use any crack and things were really good with her boyfriend, Deborah Melendez. She had a really good Thanksgiving and Christmas and was feeling great. She states that over the weekend she started feeling lonely. Her boyfriend came home drunk and immediatly went to sleep. Her daughter was at her aunts house for the weekend. She was sitting at home by herself and feeling lonely and "noone was there for me." She states that she thought about using crack and could not get it out of her mind. She left and used on Friday, Saturday and Sunday. She states that she has been feeling extremely guilty about relapsing. Deborah Melendez was very disappointed in her which made things worse. She started feeling very depressed and suicidal due to the guilt. She denies having a plan for suicide. She states that she has been off mediations for at least 1.5 months. She states that she was not able to afford  them. She has long history of noncompliance with medications. She is perseverative on her issues with her boyfriend and that he is disappointed in her and she wants him to be able to move on from this relapse. She states that she is glad that she came to get help before "things spiraled even more." She has been in residential treatment in the past but when we checked last admission she was not allowed back to some of them.   Associated Signs/Symptoms: Depression Symptoms:  depressed mood, insomnia, fatigue, feelings of worthlessness/guilt, hopelessness, suicidal thoughts without plan, (Hypo) Manic Symptoms:  Denies Anxiety Symptoms:  Excessive Worry, Psychotic Symptoms:  Denies PTSD Symptoms: Negative Total Time spent with patient: 45 minutes  Past Psychiatric History: She has had multiple admissions in the past for very similar symptoms. August 2016 (Reported depressive symptoms and HI towards fiance), August 2017 (endorsed depressive symptoms), September 2017 (SI, MDD, on crack cocaine and lacking transportation to get to her appointments), January 2018 Martin Majestic to ER at [facility] and then admitted at [facility]), October 2018 for SI. All admissions had cocaine use disorder as presenting problem. She has had poor compliance with follow up and medications.   Is the patient at risk to self? Yes.    Has the patient been a risk to self in the past 6 months? No.  Has the patient been a risk to self within the distant past? Yes.    Is the patient a risk to others? No.  Has the patient been a risk to others in the past 6 months? No.  Has the patient been  a risk to others within the distant past? No.    Alcohol Screening: 1. How often do you have a drink containing alcohol?: 2 to 4 times a month 2. How many drinks containing alcohol do you have on a typical day when you are drinking?: 10 or more 3. How often do you have six or more drinks on one occasion?: Weekly AUDIT-C Score: 9 4. How  often during the last year have you found that you were not able to stop drinking once you had started?: Weekly 5. How often during the last year have you failed to do what was normally expected from you becasue of drinking?: Never 6. How often during the last year have you needed a first drink in the morning to get yourself going after a heavy drinking session?: Never 7. How often during the last year have you had a feeling of guilt of remorse after drinking?: Never 8. How often during the last year have you been unable to remember what happened the night before because you had been drinking?: Less than monthly 9. Have you or someone else been injured as a result of your drinking?: No 10. Has a relative or friend or a doctor or another health worker been concerned about your drinking or suggested you cut down?: No Alcohol Use Disorder Identification Test Final Score (AUDIT): 13 Intervention/Follow-up: Patient Refused Substance Abuse History in the last 12 months:  Yes.  , cocaine use disorder Consequences of Substance Abuse: Family Consequences:  conflict with boyfriend Previous Psychotropic Medications: Yes  Psychological Evaluations: Yes  Past Medical History:  Past Medical History:  Diagnosis Date  . Depression   . MRSA (methicillin resistant Staphylococcus aureus)    surgery on finger 3 years ago  . Substance or medication-induced bipolar and related disorder with onset during intoxication ([place]) 09/08/2016    Past Surgical History:  Procedure Laterality Date  . FINGER SURGERY     Family History:  Family History  Problem Relation Age of Onset  . Diabetes Mother   . Hypertension Mother   . Drug abuse Father   . Schizophrenia Maternal Aunt    Family Psychiatric  History: Unknown Tobacco Screening: Have you used any form of tobacco in the last 30 days? (Cigarettes, Smokeless Tobacco, Cigars, and/or Pipes): Yes Tobacco use, Select all that apply: 5 or more cigarettes per day Are  you interested in Tobacco Cessation Medications?: No, patient refused Counseled patient on smoking cessation including recognizing danger situations, developing coping skills and basic information about quitting provided: Refused/Declined practical counseling Social History: Lives with boyfriend, Deborah Melendez. Has a 79 yo daughter, Deborah Melendez. Her family lives in [place].   Social History   Substance and Sexual Activity  Alcohol Use Yes  . Alcohol/week: 6.0 oz  . Types: 10 Shots of liquor per week   Comment: once a week drinks a bottle of vodka     Social History   Substance and Sexual Activity  Drug Use Yes  . Frequency: 1.0 times per week  . Types: Cocaine, Marijuana   Comment: last used 3-4 days ago.    Additional Social History:      History of alcohol / drug use?: Yes Longest period of sobriety (when/how long): Unknown Negative Consequences of Use: (denies) Withdrawal Symptoms: Other (Comment)(denies) Name of Substance 1: Marijuana 1 - Age of First Use: unknown 1 - Amount (size/oz): unknown 1 - Frequency: once a week 1 - Duration: unknown 1 - Last Use / Amount: 01/22/2018/unknown Name of Substance  2: crack cocaine 2 - Age of First Use: unknown 2 - Amount (size/oz): unknown 2 - Frequency: once a week 2 - Duration: unknown 2 - Last Use / Amount: 01/22/2018/unknown                Allergies:  No Known Allergies Lab Results: No results found for this or any previous visit (from the past 48 hour(s)).  Blood Alcohol level:  Lab Results  Component Value Date   ETH <10 01/23/2018   ETH <10 27/05/2375    Metabolic Disorder Labs:  Lab Results  Component Value Date   HGBA1C 5.6 10/09/2017   MPG 114.02 10/09/2017   MPG 108 01/21/2017   No results found for: PROLACTIN Lab Results  Component Value Date   CHOL 135 10/09/2017   TRIG 69 10/09/2017   HDL 52 10/09/2017   CHOLHDL 2.6 10/09/2017   VLDL 14 10/09/2017   LDLCALC 69 10/09/2017   LDLCALC 77 01/21/2017     Current Medications: Current Facility-Administered Medications  Medication Dose Route Frequency Provider Last Rate Last Dose  . acetaminophen (TYLENOL) tablet 650 mg  650 mg Oral Q6H PRN Ethelene Hal, NP      . alum & mag hydroxide-simeth (MAALOX/MYLANTA) 200-200-20 MG/5ML suspension 30 mL  30 mL Oral Q4H PRN Ethelene Hal, NP      . divalproex (DEPAKOTE) DR tablet 750 mg  750 mg Oral Q12H Ethelene Hal, NP   750 mg at 01/27/18 1115  . ferrous sulfate tablet 325 mg  325 mg Oral TID WC Ethelene Hal, NP   325 mg at 01/27/18 1115  . FLUoxetine (PROZAC) capsule 20 mg  20 mg Oral Daily Ethelene Hal, NP   20 mg at 01/27/18 1114  . gabapentin (NEURONTIN) capsule 600 mg  600 mg Oral TID Ethelene Hal, NP   600 mg at 01/27/18 1114  . hydrOXYzine (ATARAX/VISTARIL) tablet 25 mg  25 mg Oral TID PRN Ethelene Hal, NP   25 mg at 01/27/18 0601  . LORazepam (ATIVAN) tablet 1 mg  1 mg Oral BID Ethelene Hal, NP   1 mg at 01/27/18 1114  . magnesium hydroxide (MILK OF MAGNESIA) suspension 30 mL  30 mL Oral Daily PRN Ethelene Hal, NP      . QUEtiapine (SEROQUEL) tablet 50 mg  50 mg Oral TID Ethelene Hal, NP   50 mg at 01/27/18 1115  . QUEtiapine (SEROQUEL) tablet 600 mg  600 mg Oral QHS Ethelene Hal, NP      . sertraline (ZOLOFT) tablet 50 mg  50 mg Oral Daily Ethelene Hal, NP   50 mg at 01/27/18 1114  . traZODone (DESYREL) tablet 50 mg  50 mg Oral QHS PRN Ethelene Hal, NP       PTA Medications: Medications Prior to Admission  Medication Sig Dispense Refill Last Dose  . diphenhydrAMINE (BENADRYL) 25 mg capsule Take 1 capsule (25 mg total) by mouth every 6 (six) hours as needed. 30 capsule 0   . divalproex (DEPAKOTE) 250 MG DR tablet Take 3 tablets (750 mg total) by mouth every 12 (twelve) hours. 180 tablet 0   . divalproex (DEPAKOTE) 250 MG DR tablet Take 1 tablet (250 mg total) by mouth 3 (three)  times daily. 60 tablet 2   . ferrous sulfate 325 (65 FE) MG tablet Take 1 tablet (325 mg total) by mouth 3 (three) times daily with meals. 90 tablet 0   .  FLUoxetine (PROZAC) 20 MG capsule Take 1 capsule (20 mg total) by mouth daily. 30 capsule 2   . gabapentin (NEURONTIN) 600 MG tablet Take 1 tablet (600 mg total) by mouth 3 (three) times daily. 90 tablet 0   . hydrOXYzine (ATARAX/VISTARIL) 25 MG tablet Take 1 tablet (25 mg total) by mouth every 6 (six) hours. 12 tablet 0   . LORazepam (ATIVAN) 1 MG tablet Take 1 tablet (1 mg total) by mouth 2 (two) times daily. 2 tablet 0   . QUEtiapine (SEROQUEL) 300 MG tablet Take 2 tablets (600 mg total) by mouth at bedtime. 60 tablet 0   . QUEtiapine (SEROQUEL) 50 MG tablet Take 1 tablet (50 mg total) by mouth 3 (three) times daily. 90 tablet 0   . sertraline (ZOLOFT) 50 MG tablet Take 1 tablet (50 mg total) by mouth daily. 30 tablet 0     Musculoskeletal: Strength & Muscle Tone: within normal limits Gait & Station: normal Patient leans: N/A  Psychiatric Specialty Exam: Physical Exam  Nursing note and vitals reviewed.   Review of Systems  All other systems reviewed and are negative.   Blood pressure (!) 130/92, pulse 89, temperature 97.9 F (36.6 C), temperature source Oral, resp. rate 18, height 5\' 3"  (1.6 m), weight 73.9 kg (163 lb), last menstrual period 01/19/2018, SpO2 100 %.Body mass index is 28.87 kg/m.  General Appearance: Open marks on her face to to picking  Eye Contact:  Good  Speech:  Clear and Coherent  Volume:  Normal  Mood:  Depressed  Affect:  Tearful  Thought Process:  Coherent and Goal Directed  Orientation:  Full (Time, Place, and Person)  Thought Content:  Logical  Suicidal Thoughts:  No  Homicidal Thoughts:  No  Memory:  Immediate;   Poor  Judgement:  Impaired  Insight:  Lacking  Psychomotor Activity:  Normal  Concentration:  Concentration: Fair  Recall:  [employer] of Knowledge:  Fair  Language:  Fair   Akathisia:  No      Assets:  Resilience  ADL's:  Intact  Cognition:  WNL  Sleep:  Number of Hours: 6.75    Treatment Plan Summary: 42 yo female admitted due to depression and SI in the context of crack use. Pt has had history of multiple hospitalization for similar symptoms and all int he context of crack cocaine use and conflict with her boyfriend. She has long history of noncompliance with medications and follow up. She has been doing fairly well since last hospitalization and not using drugs. However, relapsed over the weekend leading to conflict with her boyfriend and she then became suicidal. She does not appear psychotic or manic. She has long history of cocaine use so diagnosis is difficult. No clear history of mania or psychosis when not using drugs and has been doing well for several months when not using drugs. Likely does not have primary bipolar disorder or psychotic disorder. She has been unable to afford medication so will simplify her medication list.  MDD with mixed features. -Restart Seroquel at 50 mg BID and 100 mg qhs  Cocaine use disorder -Pt has been unable to return to residential treatment in the past.   Observation Level/Precautions:  15 minute checks  Laboratory:  CBC Chemistry Profile done in ED. Will order TSH  Psychotherapy:    Medications:    Consultations:    Discharge Concerns:    Estimated LOS: 3-5 days  Other:     Physician Treatment Plan for Primary  Diagnosis: Major depressive disorder, recurrent episode with mixed features ([place]) Long Term Goal(s): Improvement in symptoms so as ready for discharge  Short Term Goals: Ability to disclose and discuss suicidal ideas    I certify that inpatient services furnished can reasonably be expected to improve the patient's condition.    Marylin Crosby, MD 1/31/201912:34 PM

## 2018-01-27 NOTE — BHH Group Notes (Signed)
01/27/2018  Time: 0930  Type of Therapy/Topic:  Group Therapy:  Balance in Life  Participation Level:  Did Not Attend  Description of Group:   This group will address the concept of balance and how it feels and looks when one is unbalanced. Patients will be encouraged to process areas in their lives that are out of balance and identify reasons for remaining unbalanced. Facilitators will guide patients in utilizing problem-solving interventions to address and correct the stressor making their life unbalanced. Understanding and applying boundaries will be explored and addressed for obtaining and maintaining a balanced life. Patients will be encouraged to explore ways to assertively make their unbalanced needs known to significant others in their lives, using other group members and facilitator for support and feedback.  Therapeutic Goals: 1. Patient will identify two or more emotions or situations they have that consume much of in their lives. 2. Patient will identify signs/triggers that life has become out of balance:  3. Patient will identify two ways to set boundaries in order to achieve balance in their lives:  4. Patient will demonstrate ability to communicate their needs through discussion and/or role plays  Summary of Patient Progress: Pt was invited to attend group but chose not to attend. CSW will continue to encourage pt to attend group throughout their admission.    Therapeutic Modalities:   Cognitive Behavioral Therapy Solution-Focused Therapy Assertiveness Training  Alden Hipp, MSW, LCSW 01/27/2018 10:25 AM

## 2018-01-28 LAB — TSH: TSH: 0.281 u[IU]/mL — ABNORMAL LOW (ref 0.350–4.500)

## 2018-01-28 MED ORDER — CHLORPROMAZINE HCL 100 MG PO TABS
50.0000 mg | ORAL_TABLET | Freq: Four times a day (QID) | ORAL | Status: DC | PRN
  Administered 2018-01-28 (×2): 50 mg via ORAL
  Filled 2018-01-28 (×2): qty 1

## 2018-01-28 MED ORDER — SERTRALINE HCL 25 MG PO TABS
50.0000 mg | ORAL_TABLET | Freq: Every day | ORAL | Status: DC
  Administered 2018-01-29 – 2018-01-31 (×3): 50 mg via ORAL
  Filled 2018-01-28 (×3): qty 2

## 2018-01-28 MED ORDER — QUETIAPINE FUMARATE 25 MG PO TABS
50.0000 mg | ORAL_TABLET | Freq: Two times a day (BID) | ORAL | Status: DC
  Administered 2018-01-28 – 2018-01-29 (×2): 50 mg via ORAL
  Filled 2018-01-28: qty 2

## 2018-01-28 MED ORDER — QUETIAPINE FUMARATE 100 MG PO TABS
150.0000 mg | ORAL_TABLET | Freq: Every day | ORAL | Status: DC
  Administered 2018-01-28: 21:00:00 150 mg via ORAL
  Filled 2018-01-28: qty 1

## 2018-01-28 MED ORDER — HYDROXYZINE HCL 50 MG PO TABS
50.0000 mg | ORAL_TABLET | Freq: Three times a day (TID) | ORAL | Status: DC | PRN
  Administered 2018-01-28 – 2018-01-30 (×4): 50 mg via ORAL
  Filled 2018-01-28 (×5): qty 1

## 2018-01-28 NOTE — Plan of Care (Signed)
Patient is alert and oriented X 4. Patient denies SI, HI and AVH. This morning patient stated," I just want to sleep." Patient compliant with medications.  Patient affect this morning is irritable. Patient did not attend morning meetings. Nurse will continue to monitor.

## 2018-01-28 NOTE — Tx Team (Addendum)
Interdisciplinary Treatment and Diagnostic Plan Update  01/28/2018 Time of Session: 11am Deborah Melendez MRN: [id number]  Principal Diagnosis: Major depressive disorder, recurrent episode with mixed features ([place])  Secondary Diagnoses: Principal Problem:   Major depressive disorder, recurrent episode with mixed features ([place]) Active Problems:   Cocaine use disorder, severe, dependence ([place])   Cannabis use disorder, severe, dependence ([place])   Tobacco use disorder   Current Medications:  Current Facility-Administered Medications  Medication Dose Route Frequency Provider Last Rate Last Dose  . acetaminophen (TYLENOL) tablet 650 mg  650 mg Oral Q6H PRN Ethelene Hal, NP   650 mg at 01/27/18 1934  . alum & mag hydroxide-simeth (MAALOX/MYLANTA) 200-200-20 MG/5ML suspension 30 mL  30 mL Oral Q4H PRN Ethelene Hal, NP      . ferrous sulfate tablet 325 mg  325 mg Oral TID WC Ethelene Hal, NP   325 mg at 01/28/18 1660  . gabapentin (NEURONTIN) capsule 600 mg  600 mg Oral TID Ethelene Hal, NP   600 mg at 01/28/18 6301  . hydrOXYzine (ATARAX/VISTARIL) tablet 50 mg  50 mg Oral TID PRN Marylin Crosby, MD      . magnesium hydroxide (MILK OF MAGNESIA) suspension 30 mL  30 mL Oral Daily PRN Ethelene Hal, NP      . QUEtiapine (SEROQUEL) tablet 50 mg  50 mg Oral BID Marylin Crosby, MD   50 mg at 01/28/18 6010   And  . QUEtiapine (SEROQUEL) tablet 100 mg  100 mg Oral QHS McNew, Tyson Babinski, MD   100 mg at 01/27/18 2102  . traZODone (DESYREL) tablet 50 mg  50 mg Oral QHS PRN Ethelene Hal, NP   50 mg at 01/27/18 2102   PTA Medications: Medications Prior to Admission  Medication Sig Dispense Refill Last Dose  . diphenhydrAMINE (BENADRYL) 25 mg capsule Take 1 capsule (25 mg total) by mouth every 6 (six) hours as needed. 30 capsule 0   . divalproex (DEPAKOTE) 250 MG DR tablet Take 3 tablets (750 mg total) by mouth every 12 (twelve) hours. 180 tablet 0   .  divalproex (DEPAKOTE) 250 MG DR tablet Take 1 tablet (250 mg total) by mouth 3 (three) times daily. 60 tablet 2   . ferrous sulfate 325 (65 FE) MG tablet Take 1 tablet (325 mg total) by mouth 3 (three) times daily with meals. 90 tablet 0   . FLUoxetine (PROZAC) 20 MG capsule Take 1 capsule (20 mg total) by mouth daily. 30 capsule 2   . gabapentin (NEURONTIN) 600 MG tablet Take 1 tablet (600 mg total) by mouth 3 (three) times daily. 90 tablet 0   . hydrOXYzine (ATARAX/VISTARIL) 25 MG tablet Take 1 tablet (25 mg total) by mouth every 6 (six) hours. 12 tablet 0   . LORazepam (ATIVAN) 1 MG tablet Take 1 tablet (1 mg total) by mouth 2 (two) times daily. 2 tablet 0   . QUEtiapine (SEROQUEL) 300 MG tablet Take 2 tablets (600 mg total) by mouth at bedtime. 60 tablet 0   . QUEtiapine (SEROQUEL) 50 MG tablet Take 1 tablet (50 mg total) by mouth 3 (three) times daily. 90 tablet 0   . sertraline (ZOLOFT) 50 MG tablet Take 1 tablet (50 mg total) by mouth daily. 30 tablet 0     Patient Stressors: Financial difficulties Substance abuse  Patient Strengths: Ability for insight Average or above average intelligence Capable of independent living Communication skills Supportive family/friends  Treatment Modalities:  Medication Management, Group therapy, Case management,  1 to 1 session with clinician, Psychoeducation, Recreational therapy.   Physician Treatment Plan for Primary Diagnosis: Major depressive disorder, recurrent episode with mixed features ([place]) Long Term Goal(s): Improvement in symptoms so as ready for discharge   Short Term Goals: Ability to disclose and discuss suicidal ideas  Medication Management: Evaluate patient's response, side effects, and tolerance of medication regimen.  Therapeutic Interventions: 1 to 1 sessions, Unit Group sessions and Medication administration.  Evaluation of Outcomes: Progressing  Physician Treatment Plan for Secondary Diagnosis: Principal Problem:   Major  depressive disorder, recurrent episode with mixed features ([place]) Active Problems:   Cocaine use disorder, severe, dependence ([place])   Cannabis use disorder, severe, dependence ([place])   Tobacco use disorder  Long Term Goal(s): Improvement in symptoms so as ready for discharge   Short Term Goals: Ability to disclose and discuss suicidal ideas     Medication Management: Evaluate patient's response, side effects, and tolerance of medication regimen.  Therapeutic Interventions: 1 to 1 sessions, Unit Group sessions and Medication administration.  Evaluation of Outcomes: Progressing   RN Treatment Plan for Primary Diagnosis: Major depressive disorder, recurrent episode with mixed features ([place]) Long Term Goal(s): Knowledge of disease and therapeutic regimen to maintain health will improve  Short Term Goals: Ability to demonstrate self-control, Ability to participate in decision making will improve, Ability to identify and develop effective coping behaviors will improve and Compliance with prescribed medications will improve  Medication Management: RN will administer medications as ordered by provider, will assess and evaluate patient's response and provide education to patient for prescribed medication. RN will report any adverse and/or side effects to prescribing provider.  Therapeutic Interventions: 1 on 1 counseling sessions, Psychoeducation, Medication administration, Evaluate responses to treatment, Monitor vital signs and CBGs as ordered, Perform/monitor CIWA, COWS, AIMS and Fall Risk screenings as ordered, Perform wound care treatments as ordered.  Evaluation of Outcomes: Progressing   LCSW Treatment Plan for Primary Diagnosis: Major depressive disorder, recurrent episode with mixed features ([place]) Long Term Goal(s): Safe transition to appropriate next level of care at discharge, Engage patient in therapeutic group addressing interpersonal concerns.  Short Term Goals: Engage patient in  aftercare planning with referrals and resources, Increase social support, Identify triggers associated with mental health/substance abuse issues and Increase skills for wellness and recovery  Therapeutic Interventions: Assess for all discharge needs, 1 to 1 time with Social worker, Explore available resources and support systems, Assess for adequacy in community support network, Educate family and significant other(s) on suicide prevention, Complete Psychosocial Assessment, Interpersonal group therapy.  Evaluation of Outcomes: Progressing   Progress in Treatment: Attending groups: Yes. Participating in groups: No. Taking medication as prescribed: Yes. Toleration medication: Yes. Family/Significant other contact made: Yes, individual(s) contacted:  Completed with patient. Patient refused family contact Patient understands diagnosis: Yes. Discussing patient identified problems/goals with staff: Yes. Medical problems stabilized or resolved: Yes. Denies suicidal/homicidal ideation: Yes. Issues/concerns per patient self-inventory: Yes. Other:   New problem(s) identified: No, Describe:  None  New Short Term/Long Term Goal(s): "To go back home and get back to work."  Discharge Plan or Barriers: To return home and attend outpatient treatment.   Reason for Continuation of Hospitalization: Medication stabilization  Estimated Length of Stay: 3days  Recreational Therapy: Patient Stressors: Family, Relationship, Relapse Patient Goal: Patient will identify 3 positive healthy leisure activities to participate in post discharge x5 days.   Attendees: Patient: Deborah Melendez 01/28/2018 11:40 AM  Physician: Amador Cunas,  MD 01/28/2018 11:40 AM  Nursing: Elige Radon, RN 01/28/2018 11:40 AM  RN Care Manager:  01/28/2018 11:40 AM  Social Worker: Darin Engels, [place] 01/28/2018 11:40 AM  Recreational Therapist: Isaias Sakai. Joscelin Fray CTRS, LRT 01/28/2018 11:40 AM  Other: Alden Hipp, LCSW 01/28/2018 11:40 AM   Other:  01/28/2018 11:40 AM  Other: 01/28/2018 11:40 AM    Scribe for Treatment Team: Darin Engels, LCSW 01/28/2018 11:40 AM

## 2018-01-28 NOTE — Plan of Care (Signed)
  Progressing Education: Knowledge of the prescribed therapeutic regimen will improve 01/28/2018 0121 - Progressing by [place], RN Health Behavior/Discharge Planning: Ability to make decisions will improve 01/28/2018 0121 - Progressing by [place], RN Safety: Ability to disclose and discuss suicidal ideas will improve 01/28/2018 0121 - Progressing by Alyson Locket I, RN Medication: Compliance with prescribed medication regimen will improve 01/28/2018 0121 - Progressing by [place], RN

## 2018-01-28 NOTE — BHH Suicide Risk Assessment (Signed)
[place] INPATIENT:  Family/Significant Other Suicide Prevention Education  Suicide Prevention Education:  Patient Refusal for Family/Significant Other Suicide Prevention Education: The patient Deborah Melendez has refused to provide written consent for family/significant other to be provided Family/Significant Other Suicide Prevention Education during admission and/or prior to discharge.  Physician notified.  Darin Engels 01/28/2018, 11:49 AM

## 2018-01-28 NOTE — BHH Group Notes (Signed)
  01/28/2018  Time: 1:00PM  Type of Therapy and Topic:  Group Therapy:  Feelings around Relapse and Recovery  Participation Level:  Did Not Attend   Description of Group:    Patients in this group will discuss emotions they experience before and after a relapse. They will process how experiencing these feelings, or avoidance of experiencing them, relates to having a relapse. Facilitator will guide patients to explore emotions they have related to recovery. Patients will be encouraged to process which emotions are more powerful. They will be guided to discuss the emotional reaction significant others in their lives may have to their relapse or recovery. Patients will be assisted in exploring ways to respond to the emotions of others without this contributing to a relapse.  Therapeutic Goals: 1. Patient will identify two or more emotions that lead to a relapse for them 2. Patient will identify two emotions that result when they relapse 3. Patient will identify two emotions related to recovery 4. Patient will demonstrate ability to communicate their needs through discussion and/or role plays   Summary of Patient Progress: Pt was invited to attend group but chose not to attend. CSW will continue to encourage pt to attend group throughout their admission.    Therapeutic Modalities:   Cognitive Behavioral Therapy Solution-Focused Therapy Assertiveness Training Relapse Prevention Therapy  Alden Hipp, MSW, LCSW 01/28/2018 1:16 PM

## 2018-01-28 NOTE — Progress Notes (Signed)
[facility] MD Progress Note  01/28/2018 1:45 PM Deborah Melendez  MRN:  [id number] Subjective:  Pt states that she is feeling very depressed. She still feels very guilty about the relapse on crack but states that she is really happy that she was able to make it 4 months without using. She states that they had a great holiday together. She smiles when talking about her daughter and how much joy she brings her. She denies SI or thoughts of self harm today. She is sleeping well and eating well. Affect is brighter today.   Principal Problem: Major depressive disorder, recurrent episode with mixed features ([place]) Diagnosis:   Patient Active Problem List   Diagnosis Date Noted  . Major depressive disorder, recurrent episode with mixed features ([place]) [F33.9] 01/27/2018    Priority: High  . Cocaine use disorder, severe, dependence ([place]) [F14.20] 08/08/2015    Priority: High  . Tobacco use disorder [F17.200] 05/22/2017  . Cannabis use disorder, severe, dependence ([place]) [F12.20] 01/20/2017   Total Time spent with patient: 20 minutes  Past Psychiatric History: See H&P  Past Medical History:  Past Medical History:  Diagnosis Date  . Bipolar 1 disorder, depressed, severe ([place]) 02/04/2017  . Cocaine abuse with cocaine-induced mood disorder ([place]) 07/27/2017  . Depression   . Homicidal ideations   . Manic behavior ([place])   . MDD (major depressive disorder), recurrent severe, without psychosis ([place]) 08/08/2015  . MRSA (methicillin resistant Staphylococcus aureus)    surgery on finger 3 years ago  . 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 Suicidal ideation   . UTI (urinary tract infection) 10/08/2017    Past Surgical History:  Procedure Laterality Date  . FINGER SURGERY     Family History:  Family History  Problem Relation Age of Onset  . Diabetes Mother   . Hypertension Mother   . Drug abuse Father   . Schizophrenia Maternal Aunt     Family Psychiatric  History: See H&P Social History:  Social History   Substance and Sexual Activity  Alcohol Use Yes  . Alcohol/week: 6.0 oz  . Types: 10 Shots of liquor per week   Comment: once a week drinks a bottle of vodka     Social History   Substance and Sexual Activity  Drug Use Yes  . Frequency: 1.0 times per week  . Types: Cocaine, Marijuana   Comment: last used 3-4 days ago.    Social History   Socioeconomic History  . Marital status: Single    Spouse name: None  . Number of children: None  . Years of education: None  . Highest education level: None  Social Needs  . Financial resource strain: None  . Food insecurity - worry: None  . Food insecurity - inability: None  . Transportation needs - medical: None  . Transportation needs - non-medical: None  Occupational History  . None  Tobacco Use  . Smoking status: Current Every Day Smoker    Packs/day: 1.00    Types: Cigarettes  . Smokeless tobacco: Never Used  . Tobacco comment: pt reported quiting three weeks ago  Substance and Sexual Activity  . Alcohol use: Yes    Alcohol/week: 6.0 oz    Types: 10 Shots of liquor per week    Comment: once a week drinks a bottle of vodka  . Drug use: Yes    Frequency: 1.0 times per week    Types: Cocaine, Marijuana    Comment:  last used 3-4 days ago.  Marland Kitchen Sexual activity: Yes    Birth control/protection: None  Other Topics Concern  . None  Social History Narrative  . None   Additional Social History:    History of alcohol / drug use?: Yes Longest period of sobriety (when/how long): Unknown Negative Consequences of Use: (denies) Withdrawal Symptoms: Other (Comment)(denies) Name of Substance 1: Marijuana 1 - Age of First Use: unknown 1 - Amount (size/oz): unknown 1 - Frequency: once a week 1 - Duration: unknown 1 - Last Use / Amount: 01/22/2018/unknown Name of Substance 2: crack cocaine 2 - Age of First Use: unknown 2 - Amount (size/oz): unknown 2 -  Frequency: once a week 2 - Duration: unknown 2 - Last Use / Amount: 01/22/2018/unknown                Sleep: Good  Appetite:  Good  Current Medications: Current Facility-Administered Medications  Medication Dose Route Frequency Provider Last Rate Last Dose  . acetaminophen (TYLENOL) tablet 650 mg  650 mg Oral Q6H PRN Ethelene Hal, NP   650 mg at 01/27/18 1934  . alum & mag hydroxide-simeth (MAALOX/MYLANTA) 200-200-20 MG/5ML suspension 30 mL  30 mL Oral Q4H PRN Ethelene Hal, NP      . ferrous sulfate tablet 325 mg  325 mg Oral TID WC Ethelene Hal, NP   325 mg at 01/28/18 1158  . gabapentin (NEURONTIN) capsule 600 mg  600 mg Oral TID Ethelene Hal, NP   600 mg at 01/28/18 1158  . hydrOXYzine (ATARAX/VISTARIL) tablet 50 mg  50 mg Oral TID PRN Taos Tapp, Tyson Babinski, MD      . magnesium hydroxide (MILK OF MAGNESIA) suspension 30 mL  30 mL Oral Daily PRN Ethelene Hal, NP      . QUEtiapine (SEROQUEL) tablet 50 mg  50 mg Oral BID Marylin Crosby, MD   50 mg at 01/28/18 3875   And  . QUEtiapine (SEROQUEL) tablet 100 mg  100 mg Oral QHS Girtie Wiersma, Tyson Babinski, MD   100 mg at 01/27/18 2102  . traZODone (DESYREL) tablet 50 mg  50 mg Oral QHS PRN Ethelene Hal, NP   50 mg at 01/27/18 2102    Lab Results:  Results for orders placed or performed during the hospital encounter of 01/26/18 (from the past 48 hour(s))  TSH     Status: Abnormal   Collection Time: 01/28/18  7:37 AM  Result Value Ref Range   TSH 0.281 (L) 0.350 - 4.500 uIU/mL    Comment: Performed by a 3rd Generation assay with a functional sensitivity of <=0.01 uIU/mL. Performed at [facility], [address] [place], [place] [address]     Blood Alcohol level:  Lab Results  Component Value Date   [facility] <10 01/23/2018   ETH <10 95/18/8416    Metabolic Disorder Labs: Lab Results  Component Value Date   HGBA1C 5.6 10/09/2017   MPG 114.02 10/09/2017   MPG 108 01/21/2017   No  results found for: PROLACTIN Lab Results  Component Value Date   CHOL 135 10/09/2017   TRIG 69 10/09/2017   HDL 52 10/09/2017   CHOLHDL 2.6 10/09/2017   VLDL 14 10/09/2017   LDLCALC 69 10/09/2017   LDLCALC 77 01/21/2017    Physical Findings: AIMS:  , ,  ,  ,    CIWA:  CIWA-Ar Total: 2 COWS:     Musculoskeletal: Strength & Muscle Tone: within normal limits Gait &  Station: normal Patient leans: N/A  Psychiatric Specialty Exam: Physical Exam  Nursing note and vitals reviewed.   Review of Systems  All other systems reviewed and are negative.   Blood pressure 121/88, pulse 95, temperature 97.9 F (36.6 C), temperature source Oral, resp. rate 18, height 5\' 3"  (1.6 m), weight 73.9 kg (163 lb), last menstrual period 01/19/2018, SpO2 100 %.Body mass index is 28.87 kg/m.  General Appearance: Disheveled  Eye Contact:  Good  Speech:  Clear and Coherent  Volume:  Normal  Mood:  Depressed  Affect:  Congruent  Thought Process:  Coherent and Goal Directed  Orientation:  Full (Time, Place, and Person)  Thought Content:  Logical  Suicidal Thoughts:  No  Homicidal Thoughts:  No  Memory:  Immediate;   Fair  Judgement:  Fair  Insight:  Fair  Psychomotor Activity:  Normal  Concentration:  Concentration: Fair  Recall:  [employer] of Knowledge:  Fair  Language:  Fair  Akathisia:  No      Assets:  Resilience  ADL's:  Intact  Cognition:  WNL  Sleep:  Number of Hours: 6.75     Treatment Plan Summary: 42 yo female admitted due to [place] in the context of relapse on crack cocaine. Mood is improving and SI is resolving.   Plan:  MDD with mixed features -Increase Seroquel to 50 mg BID and 150 mg qhs -Start Zoloft 50 mg daily for depression  Cocaine use disorder -Very low motivation for treatment and has been unable to return to treatment in the past  Dispo  -Pt will return home with boyfriend on discharge and follow up with Loraine Maple, MD 01/28/2018, 1:45 PM

## 2018-01-28 NOTE — BHH Group Notes (Signed)
01/28/2018 9:30AM  Type of Therapy and Topic:  Group Therapy:  Feelings around Relapse and Recovery  Participation Level:  None   Description of Group:    Patients in this group will discuss emotions they experience before and after a relapse. They will process how experiencing these feelings, or avoidance of experiencing them, relates to having a relapse. Facilitator will guide patients to explore emotions they have related to recovery. Patients will be encouraged to process which emotions are more powerful. They will be guided to discuss the emotional reaction significant others in their lives may have to patients' relapse or recovery. Patients will be assisted in exploring ways to respond to the emotions of others without this contributing to a relapse.  Therapeutic Goals: 1. Patient will identify two or more emotions that lead to a relapse for them 2. Patient will identify two emotions that result when they relapse 3. Patient will identify two emotions related to recovery 4. Patient will demonstrate ability to communicate their needs through discussion and/or role plays   Summary of Patient Progress: Kinslie came at the end of the group. She did not participate but observed others participating in the group discussion.   Therapeutic Modalities:   Cognitive Behavioral Therapy Solution-Focused Therapy Assertiveness Training Relapse Prevention Therapy   Darin Engels, [place] 01/28/2018 10:19 AM

## 2018-01-28 NOTE — Progress Notes (Signed)
D: Patient denies SI/HI/AVH. Patient is very needy and is frequently at nurses stations requesting medication. Patient states "I don't have enough meds, I need more. Can't you give me any more meds?" Patient is up frequently during the night requesting multiple medialization.   A: Patient was assessed by this nurse. Patient received scheduled medications. Q x 15 minute observation checks were completed for safety. Patient was provided with verbal education on provided medications. Patient care plan was reviewed. Patient was offered support and encouragement. Patient was encourage to attend groups, participate in unit activities and continue with plan of care.   R: Patient adheres with scheduled medication. Patient responded well to PRN medications and symptoms were relieved. Patient has no complaints of pain at this time. Patient is receptive to treatment and safety maintained on unit. Marland Kitchen

## 2018-01-28 NOTE — Progress Notes (Signed)
Recreation Therapy Notes  Date: 02.01.2019  Time: 9:30 AM  Location: Craft Room  Behavioral response: N/A  Intervention Topic: Leisure  Discussion/Intervention: Patient did not attend group.  Clinical Observations/Feedback:  Patient did not attend group. Natesha Hassey LRT/CTRS         Terriann Difonzo 01/28/2018 9:53 AM

## 2018-01-29 MED ORDER — QUETIAPINE FUMARATE 25 MG PO TABS
50.0000 mg | ORAL_TABLET | Freq: Every day | ORAL | Status: DC
  Administered 2018-01-30: 50 mg via ORAL
  Filled 2018-01-29: qty 2

## 2018-01-29 MED ORDER — QUETIAPINE FUMARATE 200 MG PO TABS
200.0000 mg | ORAL_TABLET | Freq: Every day | ORAL | Status: DC
  Administered 2018-01-29: 200 mg via ORAL
  Filled 2018-01-29: qty 1

## 2018-01-29 NOTE — Progress Notes (Signed)
D: Patient denies SI/HI/AVH. Patient is consistently at nurses station throughout the night requesting more medication. Patient is made aware of PRN schedule and informed that she must wait the required amount of time until she is able to receive medications. Patient is seen at phone yelling, and discussing sex with caller. Patient is asked multiple times to lower voice and patient continue to speak very loudly during phone calls.   A: Patient was assessed by this nurse. Patient received scheduled medications. Q x 15 minute observation checks were completed for safety. Patient was provided with verbal education on provided medications. Patient care plan was reviewed. Patient was offered support and encouragement. Patient was encourage to attend groups, participate in unit activities and continue with plan of care.   R: Patient adheres with scheduled medication. Patient responded well to PRN medications and symptoms were relieved. Patient has "no complaints of pain at this time". Patient is receptive to treatment and safety maintained on unit.

## 2018-01-29 NOTE — Plan of Care (Signed)
Patient is alert and oriented. Patient denies SI, HI and AVH. Patient is cooperative with staff and peers. Patient can be very needy and attention seeking at times. Patient is knowledgeable about medications and will ask for PRNs if needed. Patient eats meals adequately and reports no problem sleeping. Nurse will continue to monitor and encourage group meeting attendance. Safety checks Q15 minutes. Pain Managment: General experience of comfort will improve 01/29/2018 0902 - Progressing by Geraldo Docker, RN   Activity: Interest or engagement in leisure activities will improve 01/29/2018 0902 - Progressing by Geraldo Docker, RN Imbalance in normal sleep/wake cycle will improve 01/29/2018 0902 - Progressing by Geraldo Docker, RN   Education: Utilization of techniques to improve thought processes will improve 01/29/2018 0902 - Progressing by Geraldo Docker, RN Knowledge of the prescribed therapeutic regimen will improve 01/29/2018 0902 - Progressing by Geraldo Docker, RN

## 2018-01-29 NOTE — BHH Group Notes (Signed)
[facility] Group Notes:  (Nursing/MHT/Case Management/Adjunct)  Date:  01/29/2018  Time:  11:45 PM  Type of Therapy:  Group Therapy  Participation Level:  Active  Participation Quality:  Supportive  Affect:  Appropriate  Cognitive:  Alert  Insight:  Appropriate  Engagement in Group:  Supportive  Modes of Intervention:  Support  Summary of Progress/Problems:  Deborah Melendez 01/29/2018, 11:45 PM

## 2018-01-29 NOTE — Progress Notes (Signed)
[facility] MD Progress Note  01/29/2018 11:28 AM Deborah Melendez  MRN:  [id number] Subjective:   Ms. Fanton is talkative today. She states she has been having trouble sleeping, awakening every hour.  Otherwise her mood is alright.  She hopes to abstain from drugs on discharge.  She denies SI/HI.  S  Principal Problem: Major depressive disorder, recurrent episode with mixed features ([place]) Diagnosis:   Patient Active Problem List   Diagnosis Date Noted  . Major depressive disorder, recurrent episode with mixed features ([place]) [F33.9] 01/27/2018  . Tobacco use disorder [F17.200] 05/22/2017  . Cannabis use disorder, severe, dependence ([place]) [F12.20] 01/20/2017  . Cocaine use disorder, severe, dependence ([place]) [F14.20] 08/08/2015   Total Time spent with patient: 20 minutes  Past Psychiatric History: See H&P  Past Medical History:  Past Medical History:  Diagnosis Date  . Bipolar 1 disorder, depressed, severe ([place]) 02/04/2017  . Cocaine abuse with cocaine-induced mood disorder ([place]) 07/27/2017  . Depression   . Homicidal ideations   . Manic behavior ([place])   . MDD (major depressive disorder), recurrent severe, without psychosis ([place]) 08/08/2015  . MRSA (methicillin resistant Staphylococcus aureus)    surgery on finger 3 years ago  . Substance or medication-induced bipolar and related disorder with onset during intoxication ([place]) 09/08/2016  . Suicidal ideation   . UTI (urinary tract infection) 10/08/2017    Past Surgical History:  Procedure Laterality Date  . FINGER SURGERY     Family History:  Family History  Problem Relation Age of Onset  . Diabetes Mother   . Hypertension Mother   . Drug abuse Father   . Schizophrenia Maternal Aunt    Family Psychiatric  History: See H&P Social History:  Social History   Substance and Sexual Activity  Alcohol Use Yes  . Alcohol/week: 6.0 oz  . Types: 10 Shots of liquor per week   Comment: once a week drinks a bottle of vodka     Social History    Substance and Sexual Activity  Drug Use Yes  . Frequency: 1.0 times per week  . Types: Cocaine, Marijuana   Comment: last used 3-4 days ago.    Social History   Socioeconomic History  . Marital status: Single    Spouse name: None  . Number of children: None  . Years of education: None  . Highest education level: None  Social Needs  . Financial resource strain: None  . Food insecurity - worry: None  . Food insecurity - inability: None  . Transportation needs - medical: None  . Transportation needs - non-medical: None  Occupational History  . None  Tobacco Use  . Smoking status: Current Every Day Smoker    Packs/day: 1.00    Types: Cigarettes  . Smokeless tobacco: Never Used  . Tobacco comment: pt reported quiting three weeks ago  Substance and Sexual Activity  . Alcohol use: Yes    Alcohol/week: 6.0 oz    Types: 10 Shots of liquor per week    Comment: once a week drinks a bottle of vodka  . Drug use: Yes    Frequency: 1.0 times per week    Types: Cocaine, Marijuana    Comment: last used 3-4 days ago.  Marland Kitchen Sexual activity: Yes    Birth control/protection: None  Other Topics Concern  . None  Social History Narrative  . None   Additional Social History:    History of alcohol / drug use?: Yes Longest period of sobriety (when/how long): Unknown  Negative Consequences of Use: (denies) Withdrawal Symptoms: Other (Comment)(denies) Name of Substance 1: Marijuana 1 - Age of First Use: unknown 1 - Amount (size/oz): unknown 1 - Frequency: once a week 1 - Duration: unknown 1 - Last Use / Amount: 01/22/2018/unknown Name of Substance 2: crack cocaine 2 - Age of First Use: unknown 2 - Amount (size/oz): unknown 2 - Frequency: once a week 2 - Duration: unknown 2 - Last Use / Amount: 01/22/2018/unknown                Sleep: Poor  Appetite:  Good  Current Medications: Current Facility-Administered Medications  Medication Dose Route Frequency Provider Last Rate  Last Dose  . acetaminophen (TYLENOL) tablet 650 mg  650 mg Oral Q6H PRN Ethelene Hal, NP   650 mg at 01/27/18 1934  . alum & mag hydroxide-simeth (MAALOX/MYLANTA) 200-200-20 MG/5ML suspension 30 mL  30 mL Oral Q4H PRN Ethelene Hal, NP      . chlorproMAZINE (THORAZINE) tablet 50 mg  50 mg Oral QID PRN Pucilowska, Jolanta B, MD   50 mg at 01/28/18 2121  . ferrous sulfate tablet 325 mg  325 mg Oral TID WC Ethelene Hal, NP   325 mg at 01/29/18 0836  . gabapentin (NEURONTIN) capsule 600 mg  600 mg Oral TID Ethelene Hal, NP   600 mg at 01/29/18 4650  . hydrOXYzine (ATARAX/VISTARIL) tablet 50 mg  50 mg Oral TID PRN Marylin Crosby, MD   50 mg at 01/28/18 1534  . magnesium hydroxide (MILK OF MAGNESIA) suspension 30 mL  30 mL Oral Daily PRN Ethelene Hal, NP      . QUEtiapine (SEROQUEL) tablet 150 mg  150 mg Oral QHS Marylin Crosby, MD   150 mg at 01/28/18 2120   And  . QUEtiapine (SEROQUEL) tablet 50 mg  50 mg Oral BID Marylin Crosby, MD   50 mg at 01/29/18 0841  . sertraline (ZOLOFT) tablet 50 mg  50 mg Oral Daily McNew, Tyson Babinski, MD   50 mg at 01/29/18 0836  . traZODone (DESYREL) tablet 50 mg  50 mg Oral QHS PRN Ethelene Hal, NP   50 mg at 01/27/18 2102    Lab Results:  Results for orders placed or performed during the hospital encounter of 01/26/18 (from the past 48 hour(s))  TSH     Status: Abnormal   Collection Time: 01/28/18  7:37 AM  Result Value Ref Range   TSH 0.281 (L) 0.350 - 4.500 uIU/mL    Comment: Performed by a 3rd Generation assay with a functional sensitivity of <=0.01 uIU/mL. Performed at [facility], [address] [place], [place] [address]     Blood Alcohol level:  Lab Results  Component Value Date   [facility] <10 01/23/2018   ETH <10 68/11/7516    Metabolic Disorder Labs: Lab Results  Component Value Date   HGBA1C 5.6 10/09/2017   MPG 114.02 10/09/2017   MPG 108 01/21/2017   No results found for:  PROLACTIN Lab Results  Component Value Date   CHOL 135 10/09/2017   TRIG 69 10/09/2017   HDL 52 10/09/2017   CHOLHDL 2.6 10/09/2017   VLDL 14 10/09/2017   LDLCALC 69 10/09/2017   LDLCALC 77 01/21/2017    Physical Findings: AIMS:  , ,  ,  ,    CIWA:  CIWA-Ar Total: 2 COWS:     Musculoskeletal: Strength & Muscle Tone: within normal limits Gait & Station:  normal Patient leans: N/A  Psychiatric Specialty Exam: Physical Exam  Nursing note and vitals reviewed.   Review of Systems  All other systems reviewed and are negative.   Blood pressure 129/83, pulse 100, temperature 97.9 F (36.6 C), temperature source Oral, resp. rate 18, height 5\' 3"  (1.6 m), weight 73.9 kg (163 lb), last menstrual period 01/19/2018, SpO2 100 %.Body mass index is 28.87 kg/m.  General Appearance: Disheveled  Eye Contact:  Good  Speech:  Clear and Coherent  Volume:  Normal  Mood:  "okay"  Affect:  Congruent  Thought Process:  Coherent and Goal Directed  Orientation:  Full (Time, Place, and Person)  Thought Content:  Logical  Suicidal Thoughts:  No  Homicidal Thoughts:  No  Memory:  Immediate;   Fair  Judgement:  Fair  Insight:  Fair  Psychomotor Activity:  Normal  Concentration:  Concentration: Fair  Recall:  [employer] of Knowledge:  Fair  Language:  Fair  Akathisia:  No      Assets:  Resilience  ADL's:  Intact  Cognition:  WNL  Sleep:  Number of Hours: 6.75     Treatment Plan Summary: 42 yo female admitted due to [place] in the context of relapse on crack cocaine. Mood is improving and SI is resolving.   Plan:  MDD with mixed features -changed Seroquel from 50 mg BID and 150 mg qhs to 50mg  qAM and 200mg  QHS to help with insomnia  -Zoloft 50 mg daily for depression  Cocaine use disorder -Very low motivation for treatment and has been unable to return to treatment in the past  Dispo  -Pt will return home with boyfriend on discharge and follow up with Carolan Clines,  MD 01/29/2018, 11:28 AM

## 2018-01-29 NOTE — BHH Group Notes (Signed)
[place] Group Notes:  (Nursing/MHT/Case Management/Adjunct)  Date:  01/29/2018  Time:  5:00 AM  Type of Therapy:  Psychoeducational Skills  Participation Level:  Active  Participation Quality:  Appropriate and Attentive  Affect:  Appropriate  Cognitive:  Appropriate  Insight:  Appropriate and Good  Engagement in Group:  Engaged  Modes of Intervention:  Discussion, Socialization and Support  Summary of Progress/Problems:  Deborah Melendez 01/29/2018, 5:00 AM

## 2018-01-29 NOTE — [facility] Group Notes (Signed)
LCSW Group Therapy Note  01/29/2018 1:15pm  Type of Therapy and Topic: Group Therapy: Holding on to Grudges   Participation Level: Did Not Attend   Description of Group:  In this group patients will be asked to explore and define a grudge. Patients will be guided to discuss their thoughts, feelings, and reasons as to why people have grudges. Patients will process the impact grudges have on daily life and identify thoughts and feelings related to holding grudges. Facilitator will challenge patients to identify ways to let go of grudges and the benefits this provides. Patients will be confronted to address why one struggles letting go of grudges. Lastly, patients will identify feelings and thoughts related to what life would look like without grudges. This group will be process-oriented, with patients participating in exploration of their own experiences, giving and receiving support, and processing challenge from other group members.  Therapeutic Goals:  1. Patient will identify specific grudges related to their personal life.  2. Patient will identify feelings, thoughts, and beliefs around grudges.  3. Patient will identify how one releases grudges appropriately.  4. Patient will identify situations where they could have let go of the grudge, but instead chose to hold on.   Summary of Patient Progress:   Therapeutic Modalities:  Cognitive Behavioral Therapy  Solution Focused Therapy  Motivational Interviewing  Brief Therapy   Rotha Cassels  CUEBAS-COLON, LCSW 01/29/2018 12:50 PM

## 2018-01-30 MED ORDER — QUETIAPINE FUMARATE 200 MG PO TABS
200.0000 mg | ORAL_TABLET | Freq: Every day | ORAL | Status: AC
  Administered 2018-01-30: 200 mg via ORAL
  Filled 2018-01-30: qty 1

## 2018-01-30 MED ORDER — HYDROCORTISONE 1 % EX CREA
TOPICAL_CREAM | Freq: Three times a day (TID) | CUTANEOUS | Status: DC
  Administered 2018-01-30: 1 via TOPICAL
  Filled 2018-01-30: qty 28

## 2018-01-30 MED ORDER — QUETIAPINE FUMARATE 25 MG PO TABS: 250.0000 mg | ORAL_TABLET | Freq: Every day | ORAL | Status: DC

## 2018-01-30 NOTE — Progress Notes (Addendum)
[facility] MD Progress Note  01/30/2018 12:44 PM Deborah Melendez  MRN:  [id number] Subjective:   Deborah Melendez states she is ready to go home.  She talks about how she needs to abstain from drugs and she didn't feel suicidal until she had used drugs.  She is not suicidal now.  She has a rash  talkative today. She states she has been having trouble sleeping, awakening every hour.  Otherwise her mood is alright.  She hopes to abstain from drugs on discharge.  She denies SI/HI.    Principal Problem: Major depressive disorder, recurrent episode with mixed features ([place]) Diagnosis:   Patient Active Problem List   Diagnosis Date Noted  . Major depressive disorder, recurrent episode with mixed features ([place]) [F33.9] 01/27/2018  . Tobacco use disorder [F17.200] 05/22/2017  . Cannabis use disorder, severe, dependence ([place]) [F12.20] 01/20/2017  . Cocaine use disorder, severe, dependence ([place]) [F14.20] 08/08/2015   Total Time spent with patient: 20 minutes  Past Psychiatric History: See H&P  Past Medical History:  Past Medical History:  Diagnosis Date  . Bipolar 1 disorder, depressed, severe ([place]) 02/04/2017  . Cocaine abuse with cocaine-induced mood disorder ([place]) 07/27/2017  . Depression   . Homicidal ideations   . Manic behavior ([place])   . MDD (major depressive disorder), recurrent severe, without psychosis ([place]) 08/08/2015  . MRSA (methicillin resistant Staphylococcus aureus)    surgery on finger 3 years ago  . 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 Suicidal ideation   . UTI (urinary tract infection) 10/08/2017    Past Surgical History:  Procedure Laterality Date  . FINGER SURGERY     Family History:  Family History  Problem Relation Age of Onset  . Diabetes Mother   . Hypertension Mother   . Drug abuse Father   . Schizophrenia Maternal Aunt    Family Psychiatric  History: See H&P Social History:  Social  History   Substance and Sexual Activity  Alcohol Use Yes  . Alcohol/week: 6.0 oz  . Types: 10 Shots of liquor per week   Comment: once a week drinks a bottle of vodka     Social History   Substance and Sexual Activity  Drug Use Yes  . Frequency: 1.0 times per week  . Types: Cocaine, Marijuana   Comment: last used 3-4 days ago.    Social History   Socioeconomic History  . Marital status: Single    Spouse name: None  . Number of children: None  . Years of education: None  . Highest education level: None  Social Needs  . Financial resource strain: None  . Food insecurity - worry: None  . Food insecurity - inability: None  . Transportation needs - medical: None  . Transportation needs - non-medical: None  Occupational History  . None  Tobacco Use  . Smoking status: Current Every Day Smoker    Packs/day: 1.00    Types: Cigarettes  . Smokeless tobacco: Never Used  . Tobacco comment: pt reported quiting three weeks ago  Substance and Sexual Activity  . Alcohol use: Yes    Alcohol/week: 6.0 oz    Types: 10 Shots of liquor per week    Comment: once a week drinks a bottle of vodka  . Drug use: Yes    Frequency: 1.0 times per week    Types: Cocaine, Marijuana    Comment: last used 3-4 days ago.  Marland Kitchen Sexual activity: Yes  Birth control/protection: None  Other Topics Concern  . None  Social History Narrative  . None   Additional Social History:    History of alcohol / drug use?: Yes Longest period of sobriety (when/how long): Unknown Negative Consequences of Use: (denies) Withdrawal Symptoms: Other (Comment)(denies) Name of Substance 1: Marijuana 1 - Age of First Use: unknown 1 - Amount (size/oz): unknown 1 - Frequency: once a week 1 - Duration: unknown 1 - Last Use / Amount: 01/22/2018/unknown Name of Substance 2: crack cocaine 2 - Age of First Use: unknown 2 - Amount (size/oz): unknown 2 - Frequency: once a week 2 - Duration: unknown 2 - Last Use / Amount:  01/22/2018/unknown                Sleep: Fair  Appetite:  Good  Current Medications: Current Facility-Administered Medications  Medication Dose Route Frequency Provider Last Rate Last Dose  . acetaminophen (TYLENOL) tablet 650 mg  650 mg Oral Q6H PRN Ethelene Hal, NP   650 mg at 01/27/18 1934  . alum & mag hydroxide-simeth (MAALOX/MYLANTA) 200-200-20 MG/5ML suspension 30 mL  30 mL Oral Q4H PRN Ethelene Hal, NP      . chlorproMAZINE (THORAZINE) tablet 50 mg  50 mg Oral QID PRN Pucilowska, Jolanta B, MD   50 mg at 01/28/18 2121  . ferrous sulfate tablet 325 mg  325 mg Oral TID WC Ethelene Hal, NP   325 mg at 01/30/18 0813  . gabapentin (NEURONTIN) capsule 600 mg  600 mg Oral TID Ethelene Hal, NP   600 mg at 01/30/18 0813  . hydrocortisone cream 1 %   Topical TID Jolene Schimke, MD      . hydrOXYzine (ATARAX/VISTARIL) tablet 50 mg  50 mg Oral TID PRN Marylin Crosby, MD   50 mg at 01/29/18 2124  . magnesium hydroxide (MILK OF MAGNESIA) suspension 30 mL  30 mL Oral Daily PRN Ethelene Hal, NP      . QUEtiapine (SEROQUEL) tablet 200 mg  200 mg Oral QHS Jolene Schimke, MD      . Derrill Memo ON 01/31/2018] QUEtiapine (SEROQUEL) tablet 250 mg  250 mg Oral QHS Jolene Schimke, MD      . sertraline (ZOLOFT) tablet 50 mg  50 mg Oral Daily McNew, Tyson Babinski, MD   50 mg at 01/30/18 0813  . traZODone (DESYREL) tablet 50 mg  50 mg Oral QHS PRN Ethelene Hal, NP   50 mg at 01/29/18 2123    Lab Results:  No results found for this or any previous visit (from the past 61 hour(s)).  Blood Alcohol level:  Lab Results  Component Value Date   ETH <10 01/23/2018   ETH <10 70/62/3762    Metabolic Disorder Labs: Lab Results  Component Value Date   HGBA1C 5.6 10/09/2017   MPG 114.02 10/09/2017   MPG 108 01/21/2017   No results found for: PROLACTIN Lab Results  Component Value Date   CHOL 135 10/09/2017   TRIG 69 10/09/2017   HDL 52 10/09/2017    CHOLHDL 2.6 10/09/2017   VLDL 14 10/09/2017   LDLCALC 69 10/09/2017   LDLCALC 77 01/21/2017    Physical Findings: AIMS:  , ,  ,  ,    CIWA:  CIWA-Ar Total: 2 COWS:     Musculoskeletal: Strength & Muscle Tone: within normal limits Gait & Station: normal Patient leans: N/A  Psychiatric Specialty Exam: Physical Exam  Nursing note and  vitals reviewed. Skin: Rash noted. Rash is maculopapular.  Rash on upper forearms.  Pt also reported similar rash on thighs.    Review of Systems  All other systems reviewed and are negative.   Blood pressure (!) 136/94, pulse 95, temperature 97.7 F (36.5 C), temperature source Oral, resp. rate 18, height 5\' 3"  (1.6 m), weight 73.9 kg (163 lb), last menstrual period 01/19/2018, SpO2 100 %.Body mass index is 28.87 kg/m.  General Appearance: Disheveled  Eye Contact:  Good  Speech:  Clear and Coherent  Volume:  Normal  Mood:  Dysphoric  Affect:  Congruent  Thought Process:  Coherent and Goal Directed  Orientation:  Full (Time, Place, and Person)  Thought Content:  Logical  Suicidal Thoughts:  No  Homicidal Thoughts:  No  Memory:  Immediate;   Fair  Judgement:  Fair  Insight:  Fair  Psychomotor Activity:  Normal  Concentration:  Concentration: Fair  Recall:  [employer] of Knowledge:  Fair  Language:  Fair  Akathisia:  No      Assets:  Resilience  ADL's:  Intact  Cognition:  WNL  Sleep:  Number of Hours: 8     Treatment Plan Summary: 42 yo female admitted due to [place] in the context of relapse on crack cocaine. Mood is improving and SI is resolving.   Plan:  MDD with mixed features -changed Seroquel from 50 mg BID and 150 mg qhs to 50mg  qAM and 200mg  QHS to help with insomnia  -Zoloft 50 mg daily for depression  Cocaine use disorder -Very low motivation for treatment and has been unable to return to treatment in the past  Rash -hydrocortisone 1% seems like a contact dermatitis rather than drug reaction but will continue to  follow   Dispo  -Pt will return home with boyfriend on discharge and follow up with Carolan Clines, MD 01/30/2018, 12:44 PM

## 2018-01-30 NOTE — Plan of Care (Signed)
Patient is alert and oriented X 4. Patient denies SI, HI and AVH. Patient is compliant with medications. Patient states she is ready to go home today if possible. Patient also complains about not sleeping well during the night, but states she feels as though her day medications are working well. Patient eats meals adequately is appropriate on the unit to staff and peers. Nurse will continue to monitor. Pain Managment: General experience of comfort will improve 01/30/2018 1027 - Progressing by Geraldo Docker, RN   Activity: Interest or engagement in leisure activities will improve 01/30/2018 1027 - Progressing by Geraldo Docker, RN Imbalance in normal sleep/wake cycle will improve 01/30/2018 1027 - Progressing by Geraldo Docker, RN   Education: Utilization of techniques to improve thought processes will improve 01/30/2018 1027 - Progressing by Geraldo Docker, RN Knowledge of the prescribed therapeutic regimen will improve 01/30/2018 1027 - Progressing by Geraldo Docker, RN

## 2018-01-30 NOTE — Plan of Care (Signed)
Pt complained of being anxious and insomnia. Pt given prn medication per MAR. Medication was effective. Pt woke up with rash on left arm. Will have staff to notify on coming MD,. Progressing Pain Managment: General experience of comfort will improve 01/30/2018 0030 - Progressing by Corinna Capra, RN Activity: Interest or engagement in leisure activities will improve 01/30/2018 0030 - Progressing by Corinna Capra, RN Imbalance in normal sleep/wake cycle will improve 01/30/2018 0030 - Progressing by Corinna Capra, RN Education: Utilization of techniques to improve thought processes will improve 01/30/2018 0030 - Progressing by Corinna Capra, RN Knowledge of the prescribed therapeutic regimen will improve 01/30/2018 0030 - Progressing by Corinna Capra, RN Coping: Ability to cope will improve 01/30/2018 0030 - Progressing by Corinna Capra, RN Ability to verbalize feelings will improve 01/30/2018 0030 - Progressing by Corinna Capra, RN Health Behavior/Discharge Planning: Ability to make decisions will improve 01/30/2018 0030 - Progressing by Renato Battles A, RN Compliance with therapeutic regimen will improve 01/30/2018 0030 - Progressing by Corinna Capra, RN Safety: Ability to disclose and discuss suicidal ideas will improve 01/30/2018 0030 - Progressing by Corinna Capra, RN Ability to identify and utilize support systems that promote safety will improve 01/30/2018 0030 - Progressing by Corinna Capra, RN Self-Concept: Ability to verbalize positive feelings about self will improve 01/30/2018 0030 - Progressing by Corinna Capra, RN Level of anxiety will decrease 01/30/2018 0030 - Progressing by Corinna Capra, RN Health Behavior/Discharge Planning: Identification of resources available to assist in meeting health care needs will improve 01/30/2018 0030 - Progressing by Corinna Capra, RN Medication: Compliance with prescribed medication regimen will improve 01/30/2018 0030 - Progressing  by Corinna Capra, RN Self-Concept: Ability to disclose and discuss suicidal ideas will improve 01/30/2018 0030 - Progressing by Corinna Capra, RN Ability to verbalize positive feelings about self will improve 01/30/2018 0030 - Progressing by Corinna Capra, RN Education: Will be free of psychotic symptoms 01/30/2018 0030 - Progressing by Corinna Capra, RN [facility] Participation in Recreation Therapeutic Interventions STG-Other Recreation Therapy Goal (Specify) Description Patient will identify 3 positive healthy leisure activities to participate in post discharge x5 days.  01/30/2018 0030 - Progressing by Corinna Capra, RN Spiritual Needs Ability to function at adequate level 01/30/2018 0030 - Progressing by Corinna Capra, RN

## 2018-01-30 NOTE — Plan of Care (Signed)
  Progressing Pain Managment: General experience of comfort will improve 01/30/2018 2025 - Progressing by Corinna Capra, RN Activity: Interest or engagement in leisure activities will improve 01/30/2018 2025 - Progressing by Corinna Capra, RN Imbalance in normal sleep/wake cycle will improve 01/30/2018 2025 - Progressing by Corinna Capra, RN Education: Utilization of techniques to improve thought processes will improve 01/30/2018 2025 - Progressing by Corinna Capra, RN Knowledge of the prescribed therapeutic regimen will improve 01/30/2018 2025 - Progressing by Corinna Capra, RN Coping: Ability to cope will improve 01/30/2018 2025 - Progressing by Corinna Capra, RN Ability to verbalize feelings will improve 01/30/2018 2025 - Progressing by Corinna Capra, RN Health Behavior/Discharge Planning: Ability to make decisions will improve 01/30/2018 2025 - Progressing by Renato Battles A, RN Compliance with therapeutic regimen will improve 01/30/2018 2025 - Progressing by Corinna Capra, RN Safety: Ability to disclose and discuss suicidal ideas will improve 01/30/2018 2025 - Progressing by Corinna Capra, RN Ability to identify and utilize support systems that promote safety will improve 01/30/2018 2025 - Progressing by Corinna Capra, RN Self-Concept: Ability to verbalize positive feelings about self will improve 01/30/2018 2025 - Progressing by Corinna Capra, RN Level of anxiety will decrease 01/30/2018 2025 - Progressing by Corinna Capra, RN Health Behavior/Discharge Planning: Identification of resources available to assist in meeting health care needs will improve 01/30/2018 2025 - Progressing by Corinna Capra, RN Medication: Compliance with prescribed medication regimen will improve 01/30/2018 2025 - Progressing by Corinna Capra, RN Self-Concept: Ability to disclose and discuss suicidal ideas will improve 01/30/2018 2025 - Progressing by Corinna Capra, RN Ability to verbalize  positive feelings about self will improve 01/30/2018 2025 - Progressing by Corinna Capra, RN Education: Will be free of psychotic symptoms 01/30/2018 2025 - Progressing by Corinna Capra, RN [facility] Participation in Recreation Therapeutic Interventions STG-Other Recreation Therapy Goal (Specify) Description Patient will identify 3 positive healthy leisure activities to participate in post discharge x5 days.  01/30/2018 2025 - Progressing by Corinna Capra, RN Spiritual Needs Ability to function at adequate level 01/30/2018 2025 - Progressing by Corinna Capra, RN

## 2018-01-30 NOTE — [facility] Group Notes (Signed)
LCSW Group Therapy Note 01/30/2018 1:15pm  Type of Therapy and Topic: Group Therapy: Feelings Around Returning Home & Establishing a Supportive Framework and Supporting Oneself When Supports Not Available  Participation Level: Did Not Attend  Description of Group:  Patients first processed thoughts and feelings about upcoming discharge. These included fears of upcoming changes, lack of change, new living environments, judgements and expectations from others and overall stigma of mental health issues. The group then discussed the definition of a supportive framework, what that looks and feels like, and how do to discern it from an unhealthy non-supportive network. The group identified different types of supports as well as what to do when your family/friends are less than helpful or unavailable  Therapeutic Goals  1. Patient will identify one healthy supportive network that they can use at discharge. 2. Patient will identify one factor of a supportive framework and how to tell it from an unhealthy network. 3. Patient able to identify one coping skill to use when they do not have positive supports from others. 4. Patient will demonstrate ability to communicate their needs through discussion and/or role plays.  Summary of Patient Progress:  Patient was encouraged and invited to attend group. Patient did not attend group. Social worker will continue to encourage group participation in the future.   Therapeutic Modalities Cognitive Behavioral Therapy Motivational Interviewing   Celia Gibbons  CUEBAS-COLON, LCSW 01/30/2018 12:00 PM

## 2018-01-31 MED ORDER — SERTRALINE HCL 50 MG PO TABS: 50.0000 mg | ORAL_TABLET | Freq: Every day | ORAL | 0 refills | Status: DC

## 2018-01-31 MED ORDER — TRAZODONE HCL 50 MG PO TABS: 50.0000 mg | ORAL_TABLET | Freq: Every evening | ORAL | 0 refills | Status: DC | PRN

## 2018-01-31 MED ORDER — GABAPENTIN 600 MG PO TABS: 600.0000 mg | ORAL_TABLET | Freq: Three times a day (TID) | ORAL | 0 refills | Status: DC

## 2018-01-31 MED ORDER — QUETIAPINE FUMARATE 300 MG PO TABS: 300.0000 mg | ORAL_TABLET | Freq: Every day | ORAL | 0 refills | Status: DC

## 2018-01-31 MED ORDER — QUETIAPINE FUMARATE 200 MG PO TABS: 300.0000 mg | ORAL_TABLET | Freq: Every day | ORAL | Status: DC

## 2018-01-31 NOTE — Progress Notes (Signed)
Recreation Therapy Notes  Date: 02.04.2019  Time: 1:00PM  Location: Craft Room  Behavioral response: Appropriate   Intervention Topic: Goals  Discussion/Intervention: Group content on today was focused on goals. Patients described what goals are and how they define goals. Individuals expressed how they go about setting goals and reaching them. The group identified how important goals are and if they make short term goals to reach long term goals. Patients described how many goals they work on at a time and what affects them not reaching their goal. Individuals described how much time they put into planning and obtaining their goals. The group participated in the intervention "My Goal Board" and made personal goal boards to help them achieve their goal. Clinical Observations/Feedback:  Patient came to group late due to unknown reasons. She participated in the intervention and was social with peers and staff during group.  Deborah Melendez LRT/CTRS         Deborah Melendez 01/31/2018 1:57 PM

## 2018-01-31 NOTE — Discharge Summary (Signed)
Physician Discharge Summary Note  Patient:  Deborah Melendez is an 42 y.o., female MRN:  [id number] DOB:  1976-03-25 Patient phone:  [phone] (home)  Patient address:   [place] [address],  Total Time spent with patient: 20 minutes Plus 20 minutes of medication reconciliation, discharge planning, and discharge documentation Date of Admission:  01/26/2018 Date of Discharge: 01/31/18  Reason for Admission:  42 yo female admitted due to depression and SI in the context of relapse on crack cocaine. Pt is known to this provider from last hospitalization in October 2018 where she was admitted for almost exact same presentation. She has history of hospitalization with similar symptoms and long standing issues with her boyfriend. Pt states that after discharge she was doing very well. She did not use any crack and things were really good with her boyfriend, Buford. She had a really good Thanksgiving and Christmas and was feeling great. She states that over the weekend she started feeling lonely. Her boyfriend came home drunk and immediately went to sleep. Her daughter was at her aunts house for the weekend. She was sitting at home by herself and feeling lonely and "noone was there for me." She states that she thought about using crack and could not get it out of her mind. She left and used on Friday, Saturday and Sunday. She states that she has been feeling extremely guilty about relapsing. Kittie Plater was very disappointed in her which made things worse. She started feeling very depressed and suicidal due to the guilt. She denies having a plan for suicide. She states that she has been off mediations for at least 1.5 months. She states that she was not able to afford them. She has long history of noncompliance with medications. She is perseverative on her issues with her boyfriend and that he is disappointed in her and she wants him to be able to move on from this relapse. She  states that she is glad that she came to get help before "things spiraled even more." She has been in residential treatment in the past but when we checked last admission she was not allowed back to some of them.     Principal Problem: Major depressive disorder, recurrent episode with mixed features [facility]) Discharge Diagnoses: Patient Active Problem List   Diagnosis Date Noted  . Major depressive disorder, recurrent episode with mixed features ([place]) [F33.9] 01/27/2018    Priority: High  . Cocaine use disorder, severe, dependence ([place]) [F14.20] 08/08/2015    Priority: High  . Tobacco use disorder [F17.200] 05/22/2017  . Cannabis use disorder, severe, dependence ([place]) [F12.20] 01/20/2017    Past Psychiatric History: See H&P  Past Medical History:  Past Medical History:  Diagnosis Date  . Bipolar 1 disorder, depressed, severe ([place]) 02/04/2017  . Cocaine abuse with cocaine-induced mood disorder ([place]) 07/27/2017  . Depression   . Homicidal ideations   . Manic behavior ([place])   . MDD (major depressive disorder), recurrent severe, without psychosis ([place]) 08/08/2015  . MRSA (methicillin resistant Staphylococcus aureus)    surgery on finger 3 years ago  . Substance or medication-induced bipolar and related disorder with onset during intoxication ([place]) 09/08/2016  . Suicidal ideation   . UTI (urinary tract infection) 10/08/2017    Past Surgical History:  Procedure Laterality Date  . FINGER SURGERY     Family History:  Family History  Problem Relation Age of Onset  . Diabetes Mother   . Hypertension Mother   .  Drug abuse Father   . Schizophrenia Maternal Aunt    Family Psychiatric  History: See H&P Social History:  Social History   Substance and Sexual Activity  Alcohol Use Yes  . Alcohol/week: 6.0 oz  . Types: 10 Shots of liquor per week   Comment: once a week drinks a bottle of vodka     Social History   Substance and Sexual Activity  Drug Use Yes  . Frequency: 1.0 times  per week  . Types: Cocaine, Marijuana   Comment: last used 3-4 days ago.    Social History   Socioeconomic History  . Marital status: Single    Spouse name: None  . Number of children: None  . Years of education: None  . Highest education level: None  Social Needs  . Financial resource strain: None  . Food insecurity - worry: None  . Food insecurity - inability: None  . Transportation needs - medical: None  . Transportation needs - non-medical: None  Occupational History  . None  Tobacco Use  . Smoking status: Current Every Day Smoker    Packs/day: 1.00    Types: Cigarettes  . Smokeless tobacco: Never Used  . Tobacco comment: pt reported quiting three weeks ago  Substance and Sexual Activity  . Alcohol use: Yes    Alcohol/week: 6.0 oz    Types: 10 Shots of liquor per week    Comment: once a week drinks a bottle of vodka  . Drug use: Yes    Frequency: 1.0 times per week    Types: Cocaine, Marijuana    Comment: last used 3-4 days ago.  Marland Kitchen Sexual activity: Yes    Birth control/protection: None  Other Topics Concern  . None  Social History Narrative  . None    Hospital Course:  Pt was restarted on Seroquel and titrated up to 300 mg qhs for mood stability, insomnia, racing thoughts. She was also restarted on Zoloft for depression. She was not restarted on Depakote as she had been noncompliant with medications and was unable to afford her medications. The goal was to simplify medications a bit to avoid polypharmacy.  She did not appear manic or psychotic and majority of her symptoms were felt to be related to substance use. She tolerated her medications. On day of discharge, mood was much better. She was sleeping very well. She states that she is questioning if she wants to stay with [facility] or not and will have this discussion over the next few months. They have a daughter together and she loves her very much. She brightens significantly when talking about her. She denied SI or  any thoughts of self harm. She denies HI, AH, VH. She wants to stay sober and was proud of herself for having at least 4 months of sobriety. She was not motivated for any sort of substance treatment programs. She requested discharge and did not meet IVC criteria. She was given 7 day supply of medications.She is future oriented and wants to get back to work tomorrow.  The patient is at low risk of imminent suicide. Patient denied thoughts, intent, or plan for harm to self or others, expressed significant future orientation, and expressed an ability to mobilize assistance for her needs. She is presently void of any contributing psychiatric symptoms, cognitive difficulties, or substance use which would elevate his/her risk for lethality. Chronic risk for lethality is elevated in light of poor coping skills, crack use, conflict in her relationship. The chronic risk is presently  mitigated by her ongoing desire and engagement in [facility] treatment and mobilization of support from family and friends. Chronic risk may elevate if she experiences any significant loss or worsening of symptoms, which can be managed and monitored through outpatient providers. At this time,a cute risk for lethality is low and she is stable for ongoing outpatient management.   Modifiable risk factors were addressed during this hospitalization through appropriate pharmacotherapy and establishment of outpatient follow-up treatment. Some risk factors for suicide are situational (i.e. Unstable housing) or related personality pathology (i.e. Poor coping mechanisms) and thus cannot be further mitigated by continued hospitalization in this setting.    Physical Findings: AIMS:  , ,  ,  ,    CIWA:  CIWA-Ar Total: 2 COWS:     Musculoskeletal: Strength & Muscle Tone: within normal limits Gait & Station: normal Patient leans: N/A  Psychiatric Specialty Exam: Physical Exam  Nursing note and vitals reviewed.   ROS  Blood pressure 118/77, pulse  93, temperature 98 F (36.7 C), temperature source Oral, resp. rate 18, height 5\' 3"  (1.6 m), weight 73.9 kg (163 lb), last menstrual period 01/19/2018, SpO2 100 %.Body mass index is 28.87 kg/m.  General Appearance: Disheveled  Eye Contact:  Fair  Speech:  Clear and Coherent  Volume:  Normal  Mood:  Euthymic  Affect:  Congruent  Thought Process:  Coherent and Goal Directed  Orientation:  Full (Time, Place, and Person)  Thought Content:  Logical  Suicidal Thoughts:  No  Homicidal Thoughts:  No  Memory:  Immediate;   Fair  Judgement:  Fair  Insight:  Fair  Psychomotor Activity:  Normal  Concentration:  Concentration: Fair  Recall:  [place] of Knowledge:  Fair  Language:  Fair  Akathisia:  No      Assets:  Communication Skills Desire for Improvement Housing Resilience Social Support  ADL's:  Intact  Cognition:  WNL  Sleep:  Number of Hours: 7     Have you used any form of tobacco in the last 30 days? (Cigarettes, Smokeless Tobacco, Cigars, and/or Pipes): Yes  Has this patient used any form of tobacco in the last 30 days? (Cigarettes, Smokeless Tobacco, Cigars, and/or Pipes) Yes, Yes, A prescription for an FDA-approved tobacco cessation medication was offered at discharge and the patient refused  Blood Alcohol level:  Lab Results  Component Value Date   [facility] <10 01/23/2018   ETH <10 16/09/9603    Metabolic Disorder Labs:  Lab Results  Component Value Date   HGBA1C 5.6 10/09/2017   MPG 114.02 10/09/2017   MPG 108 01/21/2017   No results found for: PROLACTIN Lab Results  Component Value Date   CHOL 135 10/09/2017   TRIG 69 10/09/2017   HDL 52 10/09/2017   CHOLHDL 2.6 10/09/2017   VLDL 14 10/09/2017   [place] 69 10/09/2017   [place] 77 01/21/2017    See Psychiatric Specialty Exam and Suicide Risk Assessment completed by Attending Physician prior to discharge.  Discharge destination:  Home  Is patient on multiple antipsychotic therapies at discharge:  No    Has Patient had three or more failed trials of antipsychotic monotherapy by history:  No  Recommended Plan for Multiple Antipsychotic Therapies: NA  Discharge Instructions    Increase activity slowly   Complete by:  As directed      Allergies as of 01/31/2018   No Known Allergies     Medication List    STOP taking these medications   diphenhydrAMINE 25  mg capsule Commonly known as:  BENADRYL   divalproex 250 MG DR tablet Commonly known as:  DEPAKOTE   FLUoxetine 20 MG capsule Commonly known as:  PROZAC   hydrOXYzine 25 MG tablet Commonly known as:  ATARAX/VISTARIL   LORazepam 1 MG tablet Commonly known as:  ATIVAN     TAKE these medications     Indication  ferrous sulfate 325 (65 FE) MG tablet Take 1 tablet (325 mg total) by mouth 3 (three) times daily with meals.  Indication:  Anemia From Inadequate Iron in the Body   gabapentin 600 MG tablet Commonly known as:  NEURONTIN Take 1 tablet (600 mg total) by mouth 3 (three) times daily.  Indication:  Neuropathic Pain   QUEtiapine 300 MG tablet Commonly known as:  SEROQUEL Take 1 tablet (300 mg total) by mouth at bedtime. What changed:    how much to take  Another medication with the same name was removed. Continue taking this medication, and follow the directions you see here.  Indication:  MDD   sertraline 50 MG tablet Commonly known as:  ZOLOFT Take 1 tablet (50 mg total) by mouth daily.  Indication:  Major Depressive Disorder   traZODone 50 MG tablet Commonly known as:  DESYREL Take 1 tablet (50 mg total) by mouth at bedtime as needed for sleep.  Indication:  Trouble Sleeping      Follow-up [facility]. Go on 02/03/2018.   Specialty:  Behavioral Health Why:  Please go to your hospital follow up appointment on Thursday, 02/03/18 at [place]. Please bring a photo ID with you to your first visit. Thank you! Contact information: [place] [address] [phone]            Follow-up recommendations: Follow up with Beverly Sessions Signed: Marylin Crosby, MD 01/31/2018, 9:21 AM

## 2018-01-31 NOTE — BHH Group Notes (Signed)
01/31/2018  Time: 0930   Type of Therapy and Topic:  Group Therapy:  Overcoming Obstacles   Participation Level:  Did Not Attend   Description of Group:   In this group patients will be encouraged to explore what they see as obstacles to their own wellness and recovery. They will be guided to discuss their thoughts, feelings, and behaviors related to these obstacles. The group will process together ways to cope with barriers, with attention given to specific choices patients can make. Each patient will be challenged to identify changes they are motivated to make in order to overcome their obstacles. This group will be process-oriented, with patients participating in exploration of their own experiences, giving and receiving support, and processing challenge from other group members.   Therapeutic Goals: 1. Patient will identify personal and current obstacles as they relate to admission. 2. Patient will identify barriers that currently interfere with their wellness or overcoming obstacles.  3. Patient will identify feelings, thought process and behaviors related to these barriers. 4. Patient will identify two changes they are willing to make to overcome these obstacles:      Summary of Patient Progress Pt was invited to attend group but chose not to attend. CSW will continue to encourage pt to attend group throughout their admission.     Therapeutic Modalities:   Cognitive Behavioral Therapy Solution Focused Therapy Motivational Interviewing Relapse Prevention Therapy  Alden Hipp, MSW, LCSW 01/31/2018 10:19 AM

## 2018-01-31 NOTE — Progress Notes (Signed)
Recreation Therapy Notes  INPATIENT RECREATION TR PLAN  Patient Details Name: Lillis Nuttle MRN: [id number] DOB: June 20, 1976 Today's Date: 01/31/2018  Rec Therapy Plan Is patient appropriate for Therapeutic Recreation?: Yes Treatment times per week: at least 3 Estimated Length of Stay: 5-7 days TR Treatment/Interventions: Group participation (Comment)  Discharge Criteria Pt will be discharged from therapy if:: Discharged Treatment plan/goals/alternatives discussed and agreed upon by:: Patient/family  Discharge Summary Short term goals set: Patient will identify 3 positive healthy leisure activities to participate in post discharge x5 days.  Short term goals met: Not met Progress toward goals comments: Groups attended Which groups?: Goal setting Reason goals not met: Patient spent most of her time in her room Therapeutic equipment acquired: N/A Reason patient discharged from therapy: Discharge from hospital Pt/family agrees with progress & goals achieved: Yes Date patient discharged from therapy: 01/31/18   Rosslyn Pasion 01/31/2018, 2:03 PM

## 2018-01-31 NOTE — Progress Notes (Signed)
Patient denies SI/HI, denies A/V hallucinations. Patient verbalizes understanding of discharge instructions, follow up care and prescriptions.7 days medicines given to patient. Patient given all belongings from  locker. Patient escorted out by staff, transported by family. 

## 2018-01-31 NOTE — BHH Suicide Risk Assessment (Signed)
[facility] Discharge Suicide Risk Assessment   Principal Problem: Major depressive disorder, recurrent episode with mixed features [facility]) Discharge Diagnoses:  Patient Active Problem List   Diagnosis Date Noted  . Major depressive disorder, recurrent episode with mixed features ([place]) [F33.9] 01/27/2018    Priority: High  . Cocaine use disorder, severe, dependence ([place]) [F14.20] 08/08/2015    Priority: High  . Tobacco use disorder [F17.200] 05/22/2017  . Cannabis use disorder, severe, dependence ([place]) [F12.20] 01/20/2017    Mental Status Per Nursing Assessment::   On Admission:  Suicidal ideation indicated by patient  Demographic Factors:  Low socioeconomic status  Loss Factors: NA  Historical Factors: Impulsivity, crack cocaine use  Risk Reduction Factors:   Responsible for children under 41 years of age and Living with another person, especially a relative  Continued Clinical Symptoms:  Alcohol/Substance Abuse/Dependencies  Cognitive Features That Contribute To Risk:  None    Suicide Risk:  Minimal: No identifiable suicidal ideation.  Patients presenting with no risk factors but with morbid ruminations; may be classified as minimal risk based on the severity of the depressive symptoms  Follow-up Information    [facility]. Go on 02/03/2018.   Specialty:  Behavioral Health Why:  Please go to your hospital follow up appointment on Thursday, 02/03/18 at [place]. Please bring a photo ID with you to your first visit. Thank you! Contact information: [place] [address] [phone]           Plan Of Care/Follow-up recommendations: Follow up with Loraine Maple, MD 01/31/2018, 9:20 AM

## 2018-01-31 NOTE — Progress Notes (Signed)
Pt cont to seek medication. Pt c/o anxiety and insomnia. Pt was given prn medication per MAR. Pt complain stating"she still not going to be able to sleep because she is not getting enough medication". Medication was effective pt has been asleep all night. Pt remains on Q 15 mins safety rounds. Will cont to monitor pt.

## 2018-01-31 NOTE — Progress Notes (Signed)
  [facility] Adult Case Management Discharge Plan :  Will you be returning to the same living situation after discharge:  Yes,  returning home. At discharge, do you have transportation home?: Yes,  boyfriend  Do you have the ability to pay for your medications: Yes,  [facility]  Release of information consent forms completed and in the chart;  Patient's signature needed at discharge.  Patient to Follow up at: Follow-up Information    [facility]. Go on 02/03/2018.   Specialty:  Behavioral Health Why:  Please go to your hospital follow up appointment on Thursday, 02/03/18 at [place]. Please bring a photo ID with you to your first visit. Thank you! Contact information: [place] [address] [phone]           Next level of care provider has access to [place] and Suicide Prevention discussed: Yes,  with pt.   Have you used any form of tobacco in the last 30 days? (Cigarettes, Smokeless Tobacco, Cigars, and/or Pipes): Yes  Has patient been referred to the Quitline?: Patient refused referral  Patient has been referred for addiction treatment: Yes to [facility]   Alden Hipp, LCSW 01/31/2018, 8:45 AM

## 2018-02-08 NOTE — Discharge Summary (Signed)
Pt was transferred to [facility] for inpatient psychiatric care, crisis stabilization and medication management. Pt was in stable condition when she was transferred and all belongings were transferred with her. Pt will be admitted to [facility] nad continue her psychiatric care there.    Billey Chang, [place]   NP-C 02/08/2018    1133

## 2018-03-01 NOTE — Discharge Summary (Addendum)
Physician Discharge Summary Note  Patient:  Deborah Melendez is an 42 y.o., female MRN:  [id number] DOB:  1976-12-20 Patient phone:  [phone] (home)  Patient address:   [place] [address],  Total Time spent with patient: 30 minutes  Date of Admission:  01/26/2018   Reason for Admission:  Psychosis  Principal Problem: Major depressive disorder, recurrent episode with mixed features [facility]) Discharge Diagnoses: Patient Active Problem List   Diagnosis Date Noted  . Major depressive disorder, recurrent episode with mixed features ([place]) [F33.9] 01/27/2018  . Tobacco use disorder [F17.200] 05/22/2017  . Cannabis use disorder, severe, dependence ([place]) [F12.20] 01/20/2017  . Cocaine use disorder, severe, dependence ([place]) [F14.20] 08/08/2015    Past Psychiatric History: As above  Past Medical History:  Past Medical History:  Diagnosis Date  . Bipolar 1 disorder, depressed, severe ([place]) 02/04/2017  . Cocaine abuse with cocaine-induced mood disorder ([place]) 07/27/2017  . Depression   . Homicidal ideations   . Manic behavior ([place])   . MDD (major depressive disorder), recurrent severe, without psychosis ([place]) 08/08/2015  . MRSA (methicillin resistant Staphylococcus aureus)    surgery on finger 3 years ago  . Substance or medication-induced bipolar and related disorder with onset during intoxication ([place]) 09/08/2016  . Suicidal ideation   . UTI (urinary tract infection) 10/08/2017    Past Surgical History:  Procedure Laterality Date  . FINGER SURGERY     Family History:  Family History  Problem Relation Age of Onset  . Diabetes Mother   . Hypertension Mother   . Drug abuse Father   . Schizophrenia Maternal Aunt    Family Psychiatric  History: Unknown Social History:  Social History   Substance and Sexual Activity  Alcohol Use Yes  . Alcohol/week: 6.0 oz  . Types: 10 Shots of liquor per week   Comment: once a week drinks a bottle of vodka     Social History   Substance and Sexual Activity  Drug Use Yes  . Frequency: 1.0 times per week  . Types: Cocaine, Marijuana   Comment: last used 3-4 days ago.    Social History   Socioeconomic History  . Marital status: Single    Spouse name: Not on file  . Number of children: Not on file  . Years of education: Not on file  . Highest education level: Not on file  Social Needs  . Financial resource strain: Not on file  . Food insecurity - worry: Not on file  . Food insecurity - inability: Not on file  . Transportation needs - medical: Not on file  . Transportation needs - non-medical: Not on file  Occupational History  . Not on file  Tobacco Use  . Smoking status: Current Every Day Smoker    Packs/day: 1.00    Types: Cigarettes  . Smokeless tobacco: Never Used  . Tobacco comment: pt reported quiting three weeks ago  Substance and Sexual Activity  . Alcohol use: Yes    Alcohol/week: 6.0 oz    Types: 10 Shots of liquor per week    Comment: once a week drinks a bottle of vodka  . Drug use: Yes    Frequency: 1.0 times per week    Types: Cocaine, Marijuana    Comment: last used 3-4 days ago.  Marland Kitchen Sexual activity: Yes    Birth control/protection: None  Other Topics Concern  . Not on file  Social History Narrative  . Not on file  Hospital Course:  Pt was admitted to [facility] for crisis stabilization and medication management. Pt was monitored in the [facility] and started on antipsychotics. Pt continued to need inpatient hospitalization and was subsequently transferred to the behavioral health unit at [facility]. Pt was transferred with her belongings and in stable condition.    Musculoskeletal: Strength & Muscle Tone: within normal limits Gait & Station: normal Patient leans: N/A  Psychiatric Specialty Exam: Physical Exam  Constitutional: She appears well-developed and well-nourished.  HENT:  Head: Normocephalic.  Respiratory: Effort normal.  Musculoskeletal: Normal range of  motion.  Neurological: She is alert.  Psychiatric: Her mood appears anxious. Her affect is labile. Her speech is rapid and/or pressured. She is agitated. Thought content is paranoid and delusional. Cognition and memory are impaired. She expresses impulsivity. She exhibits a depressed mood.    Review of Systems  Psychiatric/Behavioral: Positive for depression and hallucinations. The patient is nervous/anxious.   All other systems reviewed and are negative.   Last menstrual period 01/19/2018.There is no height or weight on file to calculate BMI.  General Appearance: Casual  Eye Contact:  Good  Speech:  Clear and Coherent  Volume:  Normal  Mood:  Anxious and Depressed  Affect:  Congruent  Thought Process:  Disorganized  Orientation:  Full (Time, Place, and Person)  Thought Content:  Illogical, Delusions and Ideas of Reference:   Paranoia  Suicidal Thoughts:  Yes.  without intent/plan  Homicidal Thoughts:  No  Memory:  Immediate;   Good Recent;   Good  Judgement:  Poor  Insight:  Lacking  Psychomotor Activity:  Increased  Concentration:  Concentration: Good and Attention Span: Good  Recall:  Good  Fund of Knowledge:  Good  Language:  Good  Akathisia:  No  Handed:  Right  AIMS (if indicated):   N/A  Assets:  Agricultural consultant Housing  ADL's:  Intact  Cognition:  WNL  Sleep:   N/A        Has this patient used any form of tobacco in the last 30 days? (Cigarettes, Smokeless Tobacco, Cigars, and/or Pipes) No  Blood Alcohol level:  Lab Results  Component Value Date   ETH <10 01/23/2018   ETH <10 18/56/3149    Metabolic Disorder Labs:  Lab Results  Component Value Date   HGBA1C 5.6 10/09/2017   MPG 114.02 10/09/2017   MPG 108 01/21/2017   No results found for: PROLACTIN Lab Results  Component Value Date   CHOL 135 10/09/2017   TRIG 69 10/09/2017   HDL 52 10/09/2017   CHOLHDL 2.6 10/09/2017   VLDL 14 10/09/2017   LDLCALC 69  10/09/2017   LDLCALC 77 01/21/2017    See Psychiatric Specialty Exam and Suicide Risk Assessment completed by Attending Physician prior to discharge.  Discharge destination:  Other:  [facility]  Is patient on multiple antipsychotic therapies at discharge:  No   Has Patient had three or more failed trials of antipsychotic monotherapy by history:  No  Recommended Plan for Multiple Antipsychotic Therapies: NA   Allergies as of 01/26/2018   No Known Allergies     Medication List    ASK your doctor about these medications     Indication  ferrous sulfate 325 (65 FE) MG tablet Take 1 tablet (325 mg total) by mouth 3 (three) times daily with meals.  Indication:  Anemia From Inadequate Iron in the Body        Follow-up recommendations:  Activity:  as tolerated  Diet:  Heart healthy  Comments: Transfer to [facility] for continued inpatient psychiatric treatment.  Signed: Ethelene Hal, NP 03/01/2018, 12:39 PM   Patient seen face-to-face for psychiatric evaluation, chart reviewed and case discussed with the physician extender and developed treatment plan. Reviewed the information documented and agree with the treatment plan.  Buford Dresser, DO 03/01/18 12:56 PM

## 2018-04-19 ENCOUNTER — Encounter (HOSPITAL_COMMUNITY): Payer: Self-pay | Admitting: Emergency Medicine

## 2018-04-19 ENCOUNTER — Other Ambulatory Visit: Payer: Self-pay

## 2018-04-19 ENCOUNTER — Emergency Department (HOSPITAL_COMMUNITY)
Admission: EM | Admit: 2018-04-19 | Discharge: 2018-04-19 | Disposition: A | Discharge status: Home / Self Care | Payer: Self-pay | Attending: Emergency Medicine | Admitting: Emergency Medicine

## 2018-04-19 ENCOUNTER — Emergency Department (HOSPITAL_COMMUNITY): Payer: Self-pay

## 2018-04-19 DIAGNOSIS — F17210 Nicotine dependence, cigarettes, uncomplicated: Secondary | ICD-10-CM | POA: Insufficient documentation

## 2018-04-19 DIAGNOSIS — J209 Acute bronchitis, unspecified: Secondary | ICD-10-CM

## 2018-04-19 DIAGNOSIS — J9801 Acute bronchospasm: Secondary | ICD-10-CM | POA: Insufficient documentation

## 2018-04-19 LAB — COMPREHENSIVE METABOLIC PANEL
ALBUMIN: 3.6 g/dL (ref 3.5–5.0)
ALT: 14 U/L (ref 14–54)
AST: 21 U/L (ref 15–41)
Alkaline Phosphatase: 51 U/L (ref 38–126)
Anion gap: 8 (ref 5–15)
BUN: 8 mg/dL (ref 6–20)
CHLORIDE: 111 mmol/L (ref 101–111)
CO2: 19 mmol/L — AB (ref 22–32)
Calcium: 8.3 mg/dL — ABNORMAL LOW (ref 8.9–10.3)
Creatinine, Ser: 0.91 mg/dL (ref 0.44–1.00)
GFR calc Af Amer: >60 mL/min (ref >60)
GLUCOSE: 120 mg/dL — AB (ref 65–99)
Potassium: 3.4 mmol/L — ABNORMAL LOW (ref 3.5–5.1)
SODIUM: 138 mmol/L (ref 135–145)
Total Bilirubin: 0.3 mg/dL (ref 0.3–1.2)
Total Protein: 6.6 g/dL (ref 6.5–8.1)

## 2018-04-19 LAB — CBC WITH DIFFERENTIAL/PLATELET
Basophils Absolute: 0.1 10*3/uL (ref 0.0–0.1)
Basophils Relative: 1 %
EOS ABS: 0.8 10*3/uL — AB (ref 0.0–0.7)
EOS PCT: 12 %
HCT: 34.6 % — ABNORMAL LOW (ref 36.0–46.0)
Hemoglobin: 11.2 g/dL — ABNORMAL LOW (ref 12.0–15.0)
LYMPHS ABS: 2.9 10*3/uL (ref 0.7–4.0)
LYMPHS PCT: 44 %
MCH: 28.5 pg (ref 26.0–34.0)
MCHC: 32.4 g/dL (ref 30.0–36.0)
MCV: 88 fL (ref 78.0–100.0)
MONOS PCT: 7 %
Monocytes Absolute: 0.5 10*3/uL (ref 0.1–1.0)
Neutro Abs: 2.3 10*3/uL (ref 1.7–7.7)
Neutrophils Relative %: 36 %
PLATELETS: 263 10*3/uL (ref 150–400)
RBC: 3.93 MIL/uL (ref 3.87–5.11)
RDW: 15.2 % (ref 11.5–15.5)
WBC: 6.5 10*3/uL (ref 4.0–10.5)

## 2018-04-19 LAB — I-STAT TROPONIN, ED: Troponin i, poc: 0 ng/mL (ref 0.00–0.08)

## 2018-04-19 MED ORDER — AEROCHAMBER PLUS FLO-VU LARGE MISC: 1.0000 | Freq: Once | Status: DC

## 2018-04-19 MED ORDER — AZITHROMYCIN 250 MG PO TABS: ORAL_TABLET | ORAL | 0 refills | Status: DC

## 2018-04-19 MED ORDER — ALBUTEROL SULFATE (2.5 MG/3ML) 0.083% IN NEBU
5.0000 mg | INHALATION_SOLUTION | Freq: Once | RESPIRATORY_TRACT | Status: AC
  Administered 2018-04-19: 5 mg via RESPIRATORY_TRACT
  Filled 2018-04-19: qty 6

## 2018-04-19 MED ORDER — METHYLPREDNISOLONE SODIUM SUCC 125 MG IJ SOLR
125.0000 mg | Freq: Once | INTRAMUSCULAR | Status: AC
  Administered 2018-04-19: 125 mg via INTRAVENOUS
  Filled 2018-04-19: qty 2

## 2018-04-19 MED ORDER — MAGNESIUM SULFATE 2 GM/50ML IV SOLN
2.0000 g | Freq: Once | INTRAVENOUS | Status: AC
  Administered 2018-04-19: 2 g via INTRAVENOUS
  Filled 2018-04-19: qty 50

## 2018-04-19 MED ORDER — AEROCHAMBER PLUS FLO-VU LARGE MISC
Status: AC
  Filled 2018-04-19: qty 1

## 2018-04-19 MED ORDER — ALBUTEROL (5 MG/ML) CONTINUOUS INHALATION SOLN
10.0000 mg/h | INHALATION_SOLUTION | Freq: Once | RESPIRATORY_TRACT | Status: AC
  Administered 2018-04-19: 10 mg/h via RESPIRATORY_TRACT
  Filled 2018-04-19: qty 20

## 2018-04-19 MED ORDER — PREDNISONE 20 MG PO TABS: ORAL_TABLET | ORAL | 0 refills | Status: DC

## 2018-04-19 MED ORDER — ALBUTEROL SULFATE HFA 108 (90 BASE) MCG/ACT IN AERS
2.0000 | INHALATION_SPRAY | RESPIRATORY_TRACT | Status: DC
  Filled 2018-04-19: qty 6.7

## 2018-04-19 NOTE — ED Provider Notes (Addendum)
[place] EMERGENCY DEPARTMENT Provider Note   CSN: [id number] Arrival date & time: 04/19/18  9326  Time seen 03:25 AM    History   Chief Complaint Chief Complaint  Patient presents with  . Shortness of Breath    HPI Deborah Melendez is a 42 y.o. female.  HPI patient states she has a history of reactive airway disease as a child, she states she is never had an attack as an adult.  She states about 1-1/2 to 2 weeks ago she started having shortness of breath with wheezing and coughing.  She used her cousins inhaler which was helping until today.  She states she is coughing a lot and is coughing up some dark green/black mucus.  She has some sore throat and some rhinorrhea.  She is having posttussive vomiting but no nausea before the vomiting and she is having some diarrhea.  She states her chest is sore from coughing and her chest feels tight.  She denies swelling of her legs.  EMS gave her albuterol 10 mg plus Atrovent 0.5 mg.  PCP Patient, No Pcp Per   Past Medical History:  Diagnosis Date  . Bipolar 1 disorder, depressed, severe ([place]) 02/04/2017  . Cocaine abuse with cocaine-induced mood disorder ([place]) 07/27/2017  . Depression   . Homicidal ideations   . Manic behavior ([place])   . MDD (major depressive disorder), recurrent severe, without psychosis ([place]) 08/08/2015  . MRSA (methicillin resistant Staphylococcus aureus)    surgery on finger 3 years ago  . Substance or medication-induced bipolar and related disorder with onset during intoxication ([place]) 09/08/2016  . Suicidal ideation   . UTI (urinary tract infection) 10/08/2017    Patient Active Problem List   Diagnosis Date Noted  . Major depressive disorder, recurrent episode with mixed features ([place]) 01/27/2018  . Tobacco use disorder 05/22/2017  . Cannabis use disorder, severe, dependence ([place]) 01/20/2017  . Cocaine use disorder, severe, dependence ([place]) 08/08/2015    Past Surgical History:    Procedure Laterality Date  . FINGER SURGERY       OB History    Gravida  6   Para  4   Term      Preterm      AB      Living  4     SAB      TAB      Ectopic      Multiple      Live Births  0            Home Medications    Prior to Admission medications   Medication Sig Start Date End Date Taking? Authorizing Provider  azithromycin (ZITHROMAX Z-PAK) 250 MG tablet Take 2 po the first day then once a day for the next 4 days. 04/19/18   Rolland Porter, MD  ferrous sulfate 325 (65 FE) MG tablet Take 1 tablet (325 mg total) by mouth 3 (three) times daily with meals. Patient not taking: Reported on 04/19/2018 10/15/17 04/19/26  Marylin Crosby, MD  gabapentin (NEURONTIN) 600 MG tablet Take 1 tablet (600 mg total) by mouth 3 (three) times daily. Patient not taking: Reported on 04/19/2018 01/31/18 04/19/25  Marylin Crosby, MD  predniSONE (DELTASONE) 20 MG tablet Take 3 po QD x 3d , then 2 po QD x 3d then 1 po QD x 3d 04/19/18   Rolland Porter, MD  QUEtiapine (SEROQUEL) 300 MG tablet Take 1 tablet (300 mg total) by mouth at bedtime. Patient  not taking: Reported on 04/19/2018 01/31/18   Marylin Crosby, MD  sertraline (ZOLOFT) 50 MG tablet Take 1 tablet (50 mg total) by mouth daily. Patient not taking: Reported on 04/19/2018 01/31/18   Marylin Crosby, MD  traZODone (DESYREL) 50 MG tablet Take 1 tablet (50 mg total) by mouth at bedtime as needed for sleep. Patient not taking: Reported on 04/19/2018 01/31/18   Marylin Crosby, MD    Family History Family History  Problem Relation Age of Onset  . Diabetes Mother   . Hypertension Mother   . Drug abuse Father   . Schizophrenia Maternal Aunt     Social History Social History   Tobacco Use  . Smoking status: Current Every Day Smoker    Packs/day: 1.00    Types: Cigarettes  . Smokeless tobacco: Never Used  . Tobacco comment: pt reported quiting three weeks ago  Substance Use Topics  . Alcohol use: Yes    Alcohol/week: 6.0 oz    Types: 10  Shots of liquor per week    Comment: once a week drinks a bottle of vodka  . Drug use: Yes    Frequency: 1.0 times per week    Types: Cocaine, Marijuana    Comment: last used 3-4 days ago.  unemployed Lives with spouse Smokes 1/2 ppd   Allergies   Patient has no known allergies.   Review of Systems Review of Systems  All other systems reviewed and are negative.    Physical Exam Updated Vital Signs BP 113/79   Pulse (!) 108   Resp (!) 24   LMP 04/09/2018   SpO2 92%    Vital signs normal tachycardia, tachypnea  Physical Exam  Constitutional: She is oriented to person, place, and time. She appears well-developed and well-nourished.  Non-toxic appearance. She does not appear ill. She appears distressed.  HENT:  Head: Normocephalic and atraumatic.  Right Ear: External ear normal.  Left Ear: External ear normal.  Nose: Nose normal. No mucosal edema or rhinorrhea.  Mouth/Throat: Oropharynx is clear and moist and mucous membranes are normal. No dental abscesses or uvula swelling.  Eyes: Pupils are equal, round, and reactive to light. Conjunctivae and EOM are normal.  Neck: Normal range of motion and full passive range of motion without pain. Neck supple.  Cardiovascular: Normal rate, regular rhythm and normal heart sounds. Exam reveals no gallop and no friction rub.  No murmur heard. Pulmonary/Chest: Effort normal. No respiratory distress. She has decreased breath sounds. She has wheezes. She has no rhonchi. She has no rales. She exhibits no tenderness and no crepitus.  Abdominal: Soft. Normal appearance and bowel sounds are normal. She exhibits no distension. There is no tenderness. There is no rebound and no guarding.  Musculoskeletal: Normal range of motion. She exhibits no edema or tenderness.  Moves all extremities well.   Neurological: She is alert and oriented to person, place, and time. She has normal strength. No cranial nerve deficit.  Skin: Skin is warm, dry and  intact. No rash noted. No erythema. No pallor.  Psychiatric: She has a normal mood and affect. Her speech is normal and behavior is normal. Her mood appears not anxious.  Nursing note and vitals reviewed.    ED Treatments / Results  Labs (all labs ordered are listed, but only abnormal results are displayed) Results for orders placed or performed during the hospital encounter of 04/19/18  Comprehensive metabolic panel  Result Value Ref Range   Sodium 138 135 -  145 mmol/L   Potassium 3.4 (L) 3.5 - 5.1 mmol/L   Chloride 111 101 - 111 mmol/L   CO2 19 (L) 22 - 32 mmol/L   Glucose, Bld 120 (H) 65 - 99 mg/dL   BUN 8 6 - 20 mg/dL   Creatinine, Ser 0.91 0.44 - 1.00 mg/dL   Calcium 8.3 (L) 8.9 - 10.3 mg/dL   Total Protein 6.6 6.5 - 8.1 g/dL   Albumin 3.6 3.5 - 5.0 g/dL   AST 21 15 - 41 U/L   ALT 14 14 - 54 U/L   Alkaline Phosphatase 51 38 - 126 U/L   Total Bilirubin 0.3 0.3 - 1.2 mg/dL   GFR calc non Af Amer >60 >60 mL/min   GFR calc Af Amer >60 >60 mL/min   Anion gap 8 5 - 15  CBC with Differential  Result Value Ref Range   WBC 6.5 4.0 - 10.5 K/uL   RBC 3.93 3.87 - 5.11 MIL/uL   Hemoglobin 11.2 (L) 12.0 - 15.0 g/dL   HCT 34.6 (L) 36.0 - 46.0 %   MCV 88.0 78.0 - 100.0 fL   MCH 28.5 26.0 - 34.0 pg   MCHC 32.4 30.0 - 36.0 g/dL   RDW 15.2 11.5 - 15.5 %   Platelets 263 150 - 400 K/uL   Neutrophils Relative % 36 %   Neutro Abs 2.3 1.7 - 7.7 K/uL   Lymphocytes Relative 44 %   Lymphs Abs 2.9 0.7 - 4.0 K/uL   Monocytes Relative 7 %   Monocytes Absolute 0.5 0.1 - 1.0 K/uL   Eosinophils Relative 12 %   Eosinophils Absolute 0.8 (H) 0.0 - 0.7 K/uL   Basophils Relative 1 %   Basophils Absolute 0.1 0.0 - 0.1 K/uL  I-stat troponin, ED  Result Value Ref Range   Troponin i, poc 0.00 0.00 - 0.08 ng/mL   Comment 3           Laboratory interpretation all normal except mild hypokalemia    EKG EKG Interpretation  Date/Time:  Tuesday April 19 2018 03:40:49 EDT Ventricular Rate:  89 PR  Interval:    QRS Duration: 101 QT Interval:  367 QTC Calculation: 447 R Axis:   90 Text Interpretation:  Sinus rhythm Borderline right axis deviation No significant change since last tracing 23 Jan 2018 Confirmed by Rolland Porter [phone]) on 04/19/2018 6:55:55 AM   Radiology Dg Chest Port 1 View  Result Date: 04/19/2018 CLINICAL DATA:  Acute onset of shortness of breath and wheezing. EXAM: PORTABLE CHEST 1 VIEW COMPARISON:  None. FINDINGS: The lungs are well-aerated and clear. There is no evidence of focal opacification, pleural effusion or pneumothorax. The cardiomediastinal silhouette is within normal limits. No acute osseous abnormalities are seen. IMPRESSION: No acute cardiopulmonary process seen. Electronically Signed   By: Garald Balding M.D.   On: 04/19/2018 03:56    Procedures Procedures (including critical care time)  Medications Ordered in ED Medications  albuterol (PROVENTIL HFA;VENTOLIN HFA) 108 (90 Base) MCG/ACT inhaler 2 puff (has no administration in time range)  AEROCHAMBER PLUS FLO-VU LARGE MISC 1 each (has no administration in time range)  albuterol (PROVENTIL,VENTOLIN) solution continuous neb (10 mg/hr Nebulization Given 04/19/18 0341)  methylPREDNISolone sodium succinate (SOLU-MEDROL) 125 mg/2 mL injection 125 mg (125 mg Intravenous Given 04/19/18 0346)  magnesium sulfate IVPB 2 g 50 mL (0 g Intravenous Stopped 04/19/18 0614)  albuterol (PROVENTIL) (2.5 MG/3ML) 0.083% nebulizer solution 5 mg (5 mg Nebulization Given 04/19/18 0507)  Initial Impression / Assessment and Plan / ED Course  I have reviewed the triage vital signs and the nursing notes.  Pertinent labs & imaging results that were available during my care of the patient were reviewed by me and considered in my medical decision making (see chart for details).    Patient was started on an albuterol continuous nebulizer and she was given Solu-Medrol 125 mg IV.  Recheck at 4:40 AM patient states she is feeling  much better.  When I listen to her she has much improved air movement and a few scattered and expiratory wheezing.  She was given IV magnesium and a single nebulizer.  06:15 AM patient sleeping in no distress. Lungs are clear. She states she feels much better. Will have nursing staff ambulate with pulse ox and see how she does.   Patient was ambulated by nursing staff and her pulse ox remained 99% on room air.  I rechecked the patient at 6:45 AM she is sleeping, her lungs remain clear.  She states she did not feel worse after ambulation.  She was discharged home.  I think patient has bronchitis with bronchospasm, she had a history of reactive airway disease as a child.  Patient is also a smoker so she will be encouraged to quit smoking.  Final Clinical Impressions(s) / ED Diagnoses   Final diagnoses:  Bronchitis with bronchospasm    ED Discharge Orders        Ordered    predniSONE (DELTASONE) 20 MG tablet     04/19/18 0648    azithromycin (ZITHROMAX Z-PAK) 250 MG tablet     04/19/18 7564     Plan discharge  Rolland Porter, MD, Barbette Or, MD 04/19/18 [place], [name], MD 04/19/18 [phone]

## 2018-04-19 NOTE — Discharge Instructions (Signed)
Try to stop smoking!! Use the inhaler with the spacer for wheezing and shortness of breath. You can take mucinex DM OTC for cough. Take the prednisone and antibiotic, Zpak until gone.  Recheck if you get worse again.

## 2018-04-19 NOTE — ED Triage Notes (Signed)
Per EMS pt complaint of SHOB and dry cough x 1 week. Reports no meds, no hx, no allergies.  Hasn't checked temp but has been "sweating"  Decreased lung sounds, wheezing all fields.  10 albuterol, 0.5 atrovent.  100% on treatment.  VSS 136/106, 96, RR 20.

## 2018-04-19 NOTE — ED Notes (Signed)
Pt while ambulating sats were 99% on RA

## 2018-04-19 NOTE — ED Notes (Signed)
PT states understanding of care given, follow up care, and medication prescribed. PT ambulated from ED to car with a steady gait. 

## 2018-04-22 LAB — HIV ANTIBODY (ROUTINE TESTING W REFLEX): HIV SCREEN 4TH GENERATION: NONREACTIVE

## 2018-08-25 ENCOUNTER — Encounter: Payer: Self-pay | Admitting: Psychiatry

## 2018-08-25 ENCOUNTER — Inpatient Hospital Stay
Admission: AD | Admit: 2018-08-25 | Discharge: 2018-08-30 | DRG: 885 | Disposition: A | Discharge status: Home / Self Care | Payer: No Typology Code available for payment source | Source: Intra-hospital | Attending: Psychiatry | Admitting: Psychiatry

## 2018-08-25 ENCOUNTER — Emergency Department
Admission: EM | Admit: 2018-08-25 | Discharge: 2018-08-25 | Disposition: A | Discharge status: Other Acute Inpatient Hospital | Payer: Self-pay | Attending: Emergency Medicine | Admitting: Emergency Medicine

## 2018-08-25 ENCOUNTER — Encounter: Payer: Self-pay | Admitting: Emergency Medicine

## 2018-08-25 ENCOUNTER — Other Ambulatory Visit: Payer: Self-pay

## 2018-08-25 DIAGNOSIS — D649 Anemia, unspecified: Secondary | ICD-10-CM | POA: Diagnosis present

## 2018-08-25 DIAGNOSIS — Z5181 Encounter for therapeutic drug level monitoring: Secondary | ICD-10-CM

## 2018-08-25 DIAGNOSIS — T43226A Underdosing of selective serotonin reuptake inhibitors, initial encounter: Secondary | ICD-10-CM | POA: Diagnosis present

## 2018-08-25 DIAGNOSIS — Z818 Family history of other mental and behavioral disorders: Secondary | ICD-10-CM | POA: Diagnosis not present

## 2018-08-25 DIAGNOSIS — R45851 Suicidal ideations: Secondary | ICD-10-CM | POA: Diagnosis present

## 2018-08-25 DIAGNOSIS — F339 Major depressive disorder, recurrent, unspecified: Secondary | ICD-10-CM | POA: Diagnosis not present

## 2018-08-25 DIAGNOSIS — F17210 Nicotine dependence, cigarettes, uncomplicated: Secondary | ICD-10-CM | POA: Insufficient documentation

## 2018-08-25 DIAGNOSIS — F419 Anxiety disorder, unspecified: Secondary | ICD-10-CM | POA: Diagnosis present

## 2018-08-25 DIAGNOSIS — Z813 Family history of other psychoactive substance abuse and dependence: Secondary | ICD-10-CM | POA: Diagnosis not present

## 2018-08-25 DIAGNOSIS — Z91128 Patient's intentional underdosing of medication regimen for other reason: Secondary | ICD-10-CM

## 2018-08-25 DIAGNOSIS — F332 Major depressive disorder, recurrent severe without psychotic features: Principal | ICD-10-CM | POA: Diagnosis present

## 2018-08-25 DIAGNOSIS — T43596A Underdosing of other antipsychotics and neuroleptics, initial encounter: Secondary | ICD-10-CM | POA: Diagnosis present

## 2018-08-25 DIAGNOSIS — F1420 Cocaine dependence, uncomplicated: Secondary | ICD-10-CM | POA: Diagnosis present

## 2018-08-25 DIAGNOSIS — F1220 Cannabis dependence, uncomplicated: Secondary | ICD-10-CM | POA: Diagnosis present

## 2018-08-25 DIAGNOSIS — F1910 Other psychoactive substance abuse, uncomplicated: Secondary | ICD-10-CM

## 2018-08-25 DIAGNOSIS — F1410 Cocaine abuse, uncomplicated: Secondary | ICD-10-CM | POA: Insufficient documentation

## 2018-08-25 LAB — COMPREHENSIVE METABOLIC PANEL
ALT: 11 U/L (ref 0–44)
AST: 19 U/L (ref 15–41)
Albumin: 3.5 g/dL (ref 3.5–5.0)
Alkaline Phosphatase: 47 U/L (ref 38–126)
Anion gap: 7 (ref 5–15)
BILIRUBIN TOTAL: 0.4 mg/dL (ref 0.3–1.2)
BUN: 9 mg/dL (ref 6–20)
CO2: 23 mmol/L (ref 22–32)
Calcium: 8.4 mg/dL — ABNORMAL LOW (ref 8.9–10.3)
Chloride: 111 mmol/L (ref 98–111)
Creatinine, Ser: 0.76 mg/dL (ref 0.44–1.00)
GFR calc Af Amer: >60 mL/min (ref >60)
GFR calc non Af Amer: >60 mL/min (ref >60)
Glucose, Bld: 71 mg/dL (ref 70–99)
POTASSIUM: 3.5 mmol/L (ref 3.5–5.1)
Sodium: 141 mmol/L (ref 135–145)
TOTAL PROTEIN: 6.7 g/dL (ref 6.5–8.1)

## 2018-08-25 LAB — URINE DRUG SCREEN, QUALITATIVE (ARMC ONLY)
AMPHETAMINES, UR SCREEN: NOT DETECTED
Barbiturates, Ur Screen: NOT DETECTED
COCAINE METABOLITE, UR ~~LOC~~: POSITIVE — AB
Cannabinoid 50 Ng, Ur ~~LOC~~: POSITIVE — AB
MDMA (ECSTASY) UR SCREEN: NOT DETECTED
Methadone Scn, Ur: NOT DETECTED
OPIATE, UR SCREEN: NOT DETECTED
PHENCYCLIDINE (PCP) UR S: NOT DETECTED
Tricyclic, Ur Screen: NOT DETECTED

## 2018-08-25 LAB — CBC
HEMATOCRIT: 31.9 % — AB (ref 35.0–47.0)
HEMOGLOBIN: 10.4 g/dL — AB (ref 12.0–16.0)
MCH: 27.5 pg (ref 26.0–34.0)
MCHC: 32.6 g/dL (ref 32.0–36.0)
MCV: 84.4 fL (ref 80.0–100.0)
Platelets: 288 10*3/uL (ref 150–440)
RBC: 3.77 MIL/uL — AB (ref 3.80–5.20)
RDW: 20.7 % — AB (ref 11.5–14.5)
WBC: 5 10*3/uL (ref 3.6–11.0)

## 2018-08-25 LAB — ETHANOL: Alcohol, Ethyl (B): <10 mg/dL (ref <10)

## 2018-08-25 LAB — POCT PREGNANCY, URINE: Preg Test, Ur: NEGATIVE

## 2018-08-25 LAB — ACETAMINOPHEN LEVEL: Acetaminophen (Tylenol), Serum: <10 ug/mL — ABNORMAL LOW (ref 10–30)

## 2018-08-25 LAB — SALICYLATE LEVEL: Salicylate Lvl: <7 mg/dL (ref 2.8–30.0)

## 2018-08-25 MED ORDER — SERTRALINE HCL 25 MG PO TABS
50.0000 mg | ORAL_TABLET | Freq: Every day | ORAL | Status: DC
  Administered 2018-08-26 – 2018-08-27 (×2): 50 mg via ORAL
  Filled 2018-08-25 (×2): qty 2

## 2018-08-25 MED ORDER — QUETIAPINE FUMARATE 300 MG PO TABS
300.0000 mg | ORAL_TABLET | Freq: Every day | ORAL | Status: DC
  Administered 2018-08-25: 300 mg via ORAL
  Filled 2018-08-25 (×2): qty 1

## 2018-08-25 MED ORDER — QUETIAPINE FUMARATE 200 MG PO TABS
300.0000 mg | ORAL_TABLET | Freq: Every day | ORAL | Status: DC
  Administered 2018-08-26 – 2018-08-29 (×4): 300 mg via ORAL
  Filled 2018-08-25 (×5): qty 1

## 2018-08-25 MED ORDER — MAGNESIUM HYDROXIDE 400 MG/5ML PO SUSP
30.0000 mL | Freq: Every day | ORAL | Status: DC | PRN
  Administered 2018-08-30: 30 mL via ORAL
  Filled 2018-08-25: qty 30

## 2018-08-25 MED ORDER — HYDROXYZINE HCL 50 MG PO TABS
50.0000 mg | ORAL_TABLET | Freq: Three times a day (TID) | ORAL | Status: DC | PRN
  Administered 2018-08-26 – 2018-08-30 (×7): 50 mg via ORAL
  Filled 2018-08-25 (×8): qty 1

## 2018-08-25 MED ORDER — GABAPENTIN 300 MG PO CAPS
600.0000 mg | ORAL_CAPSULE | Freq: Three times a day (TID) | ORAL | Status: DC
  Administered 2018-08-25 (×2): 600 mg via ORAL
  Filled 2018-08-25 (×2): qty 2

## 2018-08-25 MED ORDER — GABAPENTIN 300 MG PO CAPS
600.0000 mg | ORAL_CAPSULE | Freq: Three times a day (TID) | ORAL | Status: DC
  Administered 2018-08-26 – 2018-08-30 (×15): 600 mg via ORAL
  Filled 2018-08-25 (×15): qty 2

## 2018-08-25 MED ORDER — TRAZODONE HCL 100 MG PO TABS
100.0000 mg | ORAL_TABLET | Freq: Every evening | ORAL | Status: DC | PRN
  Administered 2018-08-26 – 2018-08-29 (×4): 100 mg via ORAL
  Filled 2018-08-25 (×5): qty 1

## 2018-08-25 MED ORDER — TRAZODONE HCL 100 MG PO TABS
100.0000 mg | ORAL_TABLET | Freq: Every evening | ORAL | Status: DC | PRN
  Administered 2018-08-25: 100 mg via ORAL
  Filled 2018-08-25: qty 1

## 2018-08-25 MED ORDER — ALUM & MAG HYDROXIDE-SIMETH 200-200-20 MG/5ML PO SUSP: 30.0000 mL | ORAL | Status: DC | PRN

## 2018-08-25 MED ORDER — SERTRALINE HCL 50 MG PO TABS
50.0000 mg | ORAL_TABLET | Freq: Every day | ORAL | Status: DC
  Administered 2018-08-25: 50 mg via ORAL
  Filled 2018-08-25: qty 1

## 2018-08-25 MED ORDER — ACETAMINOPHEN 325 MG PO TABS
650.0000 mg | ORAL_TABLET | Freq: Four times a day (QID) | ORAL | Status: DC | PRN
  Administered 2018-08-27 – 2018-08-30 (×5): 650 mg via ORAL
  Filled 2018-08-25 (×4): qty 2

## 2018-08-25 NOTE — ED Notes (Signed)
Patient is to be admitted to [facility] by Dr. Weber Cooks.  Attending Physician will be Dr. Wonda Olds.   Patient has been assigned to room 306, by [place]  Nurse White.   Intake Paper Work has been signed and placed on patient chart.  ER staff is aware of the admission:   Reha: Patient's Nurse   Elberta Fortis : Patient Access.  Pt to be admitted after shift change,  BMU Nurse will contact [facility] when ready to accept the pt

## 2018-08-25 NOTE — Consult Note (Signed)
[facility] Consult   Reason for Consult: Consult for this 42 year old woman with a history of depression and substance abuse who comes voluntarily complaining of depression Referring Physician: Joni Fears Patient Identification: Deborah Melendez MRN:  [id number] Principal Diagnosis: Major depressive disorder, recurrent episode with mixed features ([place]) Diagnosis:   Patient Active Problem List   Diagnosis Date Noted  . Major depressive disorder, recurrent episode with mixed features ([place]) [F33.9] 01/27/2018  . Tobacco use disorder [F17.200] 05/22/2017  . Suicidal ideation [R45.851]   . Cannabis use disorder, severe, dependence ([place]) [F12.20] 01/20/2017  . Cocaine use disorder, severe, dependence ([place]) [F14.20] 08/08/2015    Total Time spent with patient: 1 hour  Subjective:   Deborah Melendez is a 42 y.o. female patient admitted with "I need help because I am so depressed".  HPI: Patient seen chart reviewed.  Patient comes voluntarily to the emergency room complaining of severe depression and anxiety.  She has been feeling particularly terrible for about a week.  Has not been able to sleep well for several weeks.  Nightmares every night.  Not eating well.  Mood feels depressed and hopeless.  She is having active thoughts about killing herself by stabbing herself.  She has not been on any of her psychiatric medicines for at least a month.  She admits that she has been using cocaine although she claims that she last used no less than a week ago.  Denies that she is using any other drugs.  Says that she does hear things from time to time.  Has been fighting with her boyfriend.  Has not gone for any outpatient treatment.  Picks at her skin frequently opening up sores when she is depressed and anxious.  Social history: Lives with her boyfriend and one young daughter.  Not currently working.  Fights with her boyfriend.  Feels unstable at home.  Medical history:  Multiple excoriations on her face and arms from where she picks at herself.  No known previous diagnosis of any other medical illnesses other than anemia  Substance abuse history: Recurrent problems with cocaine abuse and cannabis abuse.  Cocaine abuse seems to be a common factor with hospital admissions  Past Psychiatric History: Patient has had multiple previous psychiatric hospitalizations most recently 6 months ago.  She was discharged with a diagnosis of bipolar depression and was on Seroquel Zoloft trazodone and gabapentin.  Says that she did not really follow up at all and has been off her medicines for about a month.  Recurrently abuses cocaine although she minimizes it.  She does not have a past history of actually trying to kill herself but does have a lot of self injury.  Risk to Self:   Risk to Others:   Prior Inpatient Therapy:   Prior Outpatient Therapy:    Past Medical History:  Past Medical History:  Diagnosis Date  . Bipolar 1 disorder, depressed, severe ([place]) 02/04/2017  . Cocaine abuse with cocaine-induced mood disorder ([place]) 07/27/2017  . Depression   . Homicidal ideations   . Manic behavior ([place])   . MDD (major depressive disorder), recurrent severe, without psychosis ([place]) 08/08/2015  . MRSA (methicillin resistant Staphylococcus aureus)    surgery on finger 3 years ago  . Substance or medication-induced bipolar and related disorder with onset during intoxication ([place]) 09/08/2016  . Suicidal ideation   . UTI (urinary tract infection) 10/08/2017    Past Surgical History:  Procedure Laterality Date  . FINGER SURGERY  Family History:  Family History  Problem Relation Age of Onset  . Diabetes Mother   . Hypertension Mother   . Drug abuse Father   . Schizophrenia Maternal Aunt    Family Psychiatric  History: Positive for anxiety and depression and one aunt with schizophrenia Social History:  Social History   Substance and Sexual Activity  Alcohol Use Yes  .  Alcohol/week: 10.0 standard drinks  . Types: 10 Shots of liquor per week   Comment: once a week drinks a bottle of vodka     Social History   Substance and Sexual Activity  Drug Use Yes  . Frequency: 1.0 times per week  . Types: Cocaine, Marijuana   Comment: last used 3-4 days ago.    Social History   Socioeconomic History  . Marital status: Single    Spouse name: Not on file  . Number of children: Not on file  . Years of education: Not on file  . Highest education level: Not on file  Occupational History  . Not on file  Social Needs  . Financial resource strain: Not on file  . Food insecurity:    Worry: Not on file    Inability: Not on file  . Transportation needs:    Medical: Not on file    Non-medical: Not on file  Tobacco Use  . Smoking status: Current Every Day Smoker    Packs/day: 1.00    Types: Cigarettes  . Smokeless tobacco: Never Used  . Tobacco comment: pt reported quiting three weeks ago  Substance and Sexual Activity  . Alcohol use: Yes    Alcohol/week: 10.0 standard drinks    Types: 10 Shots of liquor per week    Comment: once a week drinks a bottle of vodka  . Drug use: Yes    Frequency: 1.0 times per week    Types: Cocaine, Marijuana    Comment: last used 3-4 days ago.  Marland Kitchen Sexual activity: Yes    Birth control/protection: None  Lifestyle  . Physical activity:    Days per week: Not on file    Minutes per session: Not on file  . Stress: Not on file  Relationships  . Social connections:    Talks on phone: Not on file    Gets together: Not on file    Attends religious service: Not on file    Active member of club or organization: Not on file    Attends meetings of clubs or organizations: Not on file    Relationship status: Not on file  Other Topics Concern  . Not on file  Social History Narrative  . Not on file   Additional Social History:    Allergies:  No Known Allergies  Labs:  Results for orders placed or performed during the  hospital encounter of 08/25/18 (from the past 48 hour(s))  Comprehensive metabolic panel     Status: Abnormal   Collection Time: 08/25/18 11:25 AM  Result Value Ref Range   Sodium 141 135 - 145 mmol/L   Potassium 3.5 3.5 - 5.1 mmol/L   Chloride 111 98 - 111 mmol/L   CO2 23 22 - 32 mmol/L   Glucose, Bld 71 70 - 99 mg/dL   BUN 9 6 - 20 mg/dL   Creatinine, Ser 0.76 0.44 - 1.00 mg/dL   Calcium 8.4 (L) 8.9 - 10.3 mg/dL   Total Protein 6.7 6.5 - 8.1 g/dL   Albumin 3.5 3.5 - 5.0 g/dL   AST 19 15 -  41 U/L   ALT 11 0 - 44 U/L   Alkaline Phosphatase 47 38 - 126 U/L   Total Bilirubin 0.4 0.3 - 1.2 mg/dL   GFR calc non Af Amer >60 >60 mL/min   GFR calc Af Amer >60 >60 mL/min    Comment: (NOTE) The eGFR has been calculated using the CKD EPI equation. This calculation has not been validated in all clinical situations. eGFR's persistently <60 mL/min signify possible Chronic Kidney Disease.    Anion gap 7 5 - 15    Comment: Performed at [facility], [address] [place], [place] [address]  Ethanol     Status: None   Collection Time: 08/25/18 11:25 AM  Result Value Ref Range   Alcohol, Ethyl (B) <10 <10 mg/dL    Comment: (NOTE) Lowest detectable limit for serum alcohol is 10 mg/dL. For medical purposes only. Performed at [facility], [address] [place], [place] [address]   Salicylate level     Status: None   Collection Time: 08/25/18 11:25 AM  Result Value Ref Range   Salicylate Lvl <0.3 2.8 - 30.0 mg/dL    Comment: Performed at [facility], [address] [place], [place] [address]  Acetaminophen level     Status: Abnormal   Collection Time: 08/25/18 11:25 AM  Result Value Ref Range   Acetaminophen (Tylenol), Serum <10 (L) 10 - 30 ug/mL    Comment: (NOTE) Therapeutic concentrations vary significantly. A range of 10-30 ug/mL  may be an effective concentration for many patients. However, some  are best treated at concentrations outside of this  range. Acetaminophen concentrations >150 ug/mL at 4 hours after ingestion  and >50 ug/mL at 12 hours after ingestion are often associated with  toxic reactions. Performed at [facility], [address] [place], [place] [address]   cbc     Status: Abnormal   Collection Time: 08/25/18 11:25 AM  Result Value Ref Range   WBC 5.0 3.6 - 11.0 K/uL   RBC 3.77 (L) 3.80 - 5.20 MIL/uL   Hemoglobin 10.4 (L) 12.0 - 16.0 g/dL   HCT 31.9 (L) 35.0 - 47.0 %   MCV 84.4 80.0 - 100.0 fL   MCH 27.5 26.0 - 34.0 pg   MCHC 32.6 32.0 - 36.0 g/dL   RDW 20.7 (H) 11.5 - 14.5 %   Platelets 288 150 - 440 K/uL    Comment: Performed at [facility], [address], [place],  [address]  Urine Drug Screen, Qualitative     Status: Abnormal   Collection Time: 08/25/18 11:25 AM  Result Value Ref Range   Tricyclic, Ur Screen NONE DETECTED NONE DETECTED   Amphetamines, Ur Screen NONE DETECTED NONE DETECTED   MDMA (Ecstasy)Ur Screen NONE DETECTED NONE DETECTED   Cocaine Metabolite,Ur [place] POSITIVE (A) NONE DETECTED   Opiate, Ur Screen NONE DETECTED NONE DETECTED   Phencyclidine (PCP) Ur S NONE DETECTED NONE DETECTED   Cannabinoid 50 Ng, Ur  POSITIVE (A) NONE DETECTED   Barbiturates, Ur Screen NONE DETECTED NONE DETECTED   Benzodiazepine, Ur Scrn TEST NOT PERFORMED, REAGENT NOT AVAILABLE (A) NONE DETECTED   Methadone Scn, Ur NONE DETECTED NONE DETECTED    Comment: (NOTE) Tricyclics + metabolites, urine    Cutoff 1000 ng/mL Amphetamines + metabolites, urine  Cutoff 1000 ng/mL MDMA (Ecstasy), urine              Cutoff 500 ng/mL Cocaine Metabolite, urine  Cutoff 300 ng/mL Opiate + metabolites, urine        Cutoff 300 ng/mL Phencyclidine (PCP), urine         Cutoff 25 ng/mL Cannabinoid, urine                 Cutoff 50 ng/mL Barbiturates + metabolites, urine  Cutoff 200 ng/mL Benzodiazepine, urine              Cutoff 200 ng/mL Methadone, urine                   Cutoff 300 ng/mL The  urine drug screen provides only a preliminary, unconfirmed analytical test result and should not be used for non-medical purposes. Clinical consideration and professional judgment should be applied to any positive drug screen result due to possible interfering substances. A more specific alternate chemical method must be used in order to obtain a confirmed analytical result. Gas chromatography / mass spectrometry (GC/MS) is the preferred confirmat ory method. Performed at [facility], [address] [place], [place] [address]   Pregnancy, urine POC     Status: None   Collection Time: 08/25/18 11:32 AM  Result Value Ref Range   Preg Test, Ur NEGATIVE NEGATIVE    Comment:        THE SENSITIVITY OF THIS METHODOLOGY IS >24 mIU/mL     Current Facility-Administered Medications  Medication Dose Route Frequency Provider Last Rate Last Dose  . gabapentin (NEURONTIN) capsule 600 mg  600 mg Oral TID Clapacs, John T, MD      . QUEtiapine (SEROQUEL) tablet 300 mg  300 mg Oral QHS Clapacs, John T, MD      . sertraline (ZOLOFT) tablet 50 mg  50 mg Oral Daily Clapacs, John T, MD      . traZODone (DESYREL) tablet 100 mg  100 mg Oral QHS PRN Clapacs, [name], MD       Current Outpatient Medications  Medication Sig Dispense Refill  . azithromycin (ZITHROMAX Z-PAK) 250 MG tablet Take 2 po the first day then once a day for the next 4 days. 6 tablet 0  . ferrous sulfate 325 (65 FE) MG tablet Take 1 tablet (325 mg total) by mouth 3 (three) times daily with meals. (Patient not taking: Reported on 04/19/2018) 90 tablet 0  . gabapentin (NEURONTIN) 600 MG tablet Take 1 tablet (600 mg total) by mouth 3 (three) times daily. (Patient not taking: Reported on 04/19/2018) 90 tablet 0  . predniSONE (DELTASONE) 20 MG tablet Take 3 po QD x 3d , then 2 po QD x 3d then 1 po QD x 3d 18 tablet 0  . QUEtiapine (SEROQUEL) 300 MG tablet Take 1 tablet (300 mg total) by mouth at bedtime. (Patient not taking: Reported  on 04/19/2018) 30 tablet 0  . sertraline (ZOLOFT) 50 MG tablet Take 1 tablet (50 mg total) by mouth daily. (Patient not taking: Reported on 04/19/2018) 30 tablet 0  . traZODone (DESYREL) 50 MG tablet Take 1 tablet (50 mg total) by mouth at bedtime as needed for sleep. (Patient not taking: Reported on 04/19/2018) 30 tablet 0    Musculoskeletal: Strength & Muscle Tone: within normal limits Gait & Station: normal Patient leans: N/A  Psychiatric Specialty Exam: Physical Exam  Nursing note and vitals reviewed. Constitutional: She appears well-developed and well-nourished.  HENT:  Head: Normocephalic and atraumatic.  Eyes: Pupils are equal, round, and reactive to light. Conjunctivae are normal.  Neck: Normal range of motion.  Cardiovascular:  Regular rhythm and normal heart sounds.  Respiratory: Effort normal. No respiratory distress.  GI: Soft.  Musculoskeletal: Normal range of motion.  Neurological: She is alert.  Skin: Skin is warm and dry.  Psychiatric: Her speech is normal. Her mood appears anxious. She is agitated. She is not aggressive. Cognition and memory are impaired. She expresses impulsivity. She exhibits a depressed mood. She expresses suicidal ideation. She expresses suicidal plans.    Review of Systems  Constitutional: Negative.   HENT: Negative.   Eyes: Negative.   Respiratory: Negative.   Cardiovascular: Negative.   Gastrointestinal: Negative.   Musculoskeletal: Negative.   Skin: Negative.   Neurological: Negative.   Psychiatric/Behavioral: Positive for depression, hallucinations, memory loss, substance abuse and suicidal ideas. The patient is nervous/anxious and has insomnia.     Blood pressure (!) 154/117, pulse 77, temperature 97.7 F (36.5 C), temperature source Oral, resp. rate 20, height 5' 3"  (1.6 m), weight 68 kg, last menstrual period 08/18/2018, SpO2 100 %.Body mass index is 26.57 kg/m.  General Appearance: Disheveled  Eye Contact:  Fair  Speech:  Normal  Rate  Volume:  Normal  Mood:  Anxious, Depressed and Dysphoric  Affect:  Congruent  Thought Process:  Coherent  Orientation:  Full (Time, Place, and Person)  Thought Content:  Rumination and Tangential  Suicidal Thoughts:  Yes.  with intent/plan  Homicidal Thoughts:  No  Memory:  Immediate;   Fair Recent;   Fair Remote;   Fair  Judgement:  Fair  Insight:  Fair  Psychomotor Activity:  Decreased  Concentration:  Concentration: Fair  Recall:  [employer] of Knowledge:  Fair  Language:  Fair  Akathisia:  No  Handed:  Right  AIMS (if indicated):     Assets:  Desire for Improvement Housing Physical Health Resilience Social Support  ADL's:  Intact  Cognition:  WNL  Sleep:        Treatment Plan Summary: Daily contact with patient to assess and evaluate symptoms and progress in treatment, Medication management and Plan 42 year old woman with recurrent depression with active suicidal ideation.  Meets criteria for admission to the hospital.  Patient is agreeable to voluntary admission.  Orders completed.  Restart medicines as prescribed previously including Seroquel Zoloft trazodone and gabapentin.  No indication for any other acute medical treatment.  Check EKG and basic labs as usually done.  Engage patient in substance abuse and mental health treatment.  Case reviewed with TTS and emergency room doctor.  Disposition: Recommend psychiatric Inpatient admission when medically cleared. Supportive therapy provided about ongoing stressors.  Alethia Berthold, MD 08/25/2018 12:50 PM

## 2018-08-25 NOTE — ED Notes (Signed)
First Nurse Note: Patient crying states she needs help with "mental issues".  Hx of anxiety and depression and has run out of her medications.  Alert and oriented.

## 2018-08-25 NOTE — Progress Notes (Signed)
Patient ID: Deborah Melendez, female   DOB: May 21, 1976, 42 y.o.   MRN: [id number] Patient presents voluntarily after experiencing severe suicidal thoughts and plan. Presents with increased anxiety and depression. Reports that she has been feeling depressed and wanting to kill herself. Was planning to stab herself. Presents with history of Bipolar I disorder and medication non-compliance. Has active history of Cocaine and THC use. Not cooperative during admission: cursing, yelling and refusing to participate. Refused skin assessment and said that she just wants to go to bed. This was witness by Karen Kays., Charge nurse. Patient was directed to her room and requested not to be disturbed with "those nonsense questions". Safety precautions initiated. Will be evaluated by MD in AM.

## 2018-08-25 NOTE — ED Notes (Signed)
TTS attempted for a second time to reassess pt. Pt is uncooperative and refused evaluation at this time.

## 2018-08-25 NOTE — ED Notes (Signed)
VOL/ Consult completed/Move to [place] to admit to BMU

## 2018-08-25 NOTE — ED Triage Notes (Signed)
Pt reports she has been out of her meds for a long time and can not cope anymore and is having suicidal thoughts. Pt reports no specific plan will just do it the quickest way she can.

## 2018-08-25 NOTE — ED Notes (Signed)
BEHAVIORAL HEALTH ROUNDING Patient sleeping: No. Patient alert and oriented: yes Behavior appropriate: Yes.  ; If no, describe:  Nutrition and fluids offered: yes Toileting and hygiene offered: Yes  Sitter present: q15 minute observations and security monitoring Law enforcement present: Yes    

## 2018-08-25 NOTE — ED Triage Notes (Signed)
Pt tearful in triage and yelling "I am top aggravated right now and I can't let you draw blood". Pt agreeable to change clothes but refuses blood draw or urine until she gets her meds.

## 2018-08-25 NOTE — Tx Team (Signed)
Initial Treatment Plan 08/25/2018 11:13 PM Deborah Melendez [id number]    PATIENT STRESSORS: Financial difficulties Medication change or noncompliance Substance abuse   PATIENT STRENGTHS: Ability for insight Communication skills   PATIENT IDENTIFIED PROBLEMS: Depression  anxiety  Suicidal thoughts                 DISCHARGE CRITERIA:  Ability to meet basic life and health needs Improved stabilization in mood, thinking, and/or behavior Motivation to continue treatment in a less acute level of care Reduction of life-threatening or endangering symptoms to within safe limits Verbal commitment to aftercare and medication compliance  PRELIMINARY DISCHARGE PLAN: Outpatient therapy Return to previous living arrangement  PATIENT/FAMILY INVOLVEMENT: This treatment plan has been presented to and reviewed with the patient, Deborah Melendez.  The patient has been given the opportunity to ask questions and make suggestions.  Ronelle Nigh, RN 08/25/2018, 11:13 PM

## 2018-08-25 NOTE — ED Provider Notes (Signed)
[facility] Emergency Department Provider Note  ____________________________________________  Time seen: Approximately 1:57 PM  I have reviewed the triage vital signs and the nursing notes.   HISTORY  Chief Complaint Suicidal    HPI Deborah Melendez is a 42 y.o. female with a history of polysubstance abuse, bipolar disorder who comes the ED complaining of running out of her medication and feeling out of control and feeling hopeless and suicidal.  No specific plan at this time.  Symptoms are constant, worsening, no aggravating or alleviating factors, severe.  No completed self-harm thus far in this episode.  Uses cocaine marijuana and tobacco, alcohol sometimes.      Past Medical History:  Diagnosis Date  . Bipolar 1 disorder, depressed, severe ([place]) 02/04/2017  . Cocaine abuse with cocaine-induced mood disorder ([place]) 07/27/2017  . Depression   . Homicidal ideations   . Manic behavior ([place])   . MDD (major depressive disorder), recurrent severe, without psychosis ([place]) 08/08/2015  . MRSA (methicillin resistant Staphylococcus aureus)    surgery on finger 3 years ago  . Substance or medication-induced bipolar and related disorder with onset during intoxication ([place]) 09/08/2016  . Suicidal ideation   . UTI (urinary tract infection) 10/08/2017     Patient Active Problem List   Diagnosis Date Noted  . Major depressive disorder, recurrent episode with mixed features ([place]) 01/27/2018  . Tobacco use disorder 05/22/2017  . Suicidal ideation   . Cannabis use disorder, severe, dependence ([place]) 01/20/2017  . Cocaine use disorder, severe, dependence ([place]) 08/08/2015     Past Surgical History:  Procedure Laterality Date  . FINGER SURGERY       Prior to Admission medications   Medication Sig Start Date End Date Taking? Authorizing Provider  azithromycin (ZITHROMAX Z-PAK) 250 MG tablet Take 2 po the first day then once a day for the next 4 days. 04/19/18    Rolland Porter, MD  ferrous sulfate 325 (65 FE) MG tablet Take 1 tablet (325 mg total) by mouth 3 (three) times daily with meals. Patient not taking: Reported on 04/19/2018 10/15/17 04/19/26  Marylin Crosby, MD  gabapentin (NEURONTIN) 600 MG tablet Take 1 tablet (600 mg total) by mouth 3 (three) times daily. Patient not taking: Reported on 04/19/2018 01/31/18 04/19/25  Marylin Crosby, MD  predniSONE (DELTASONE) 20 MG tablet Take 3 po QD x 3d , then 2 po QD x 3d then 1 po QD x 3d 04/19/18   Rolland Porter, MD  QUEtiapine (SEROQUEL) 300 MG tablet Take 1 tablet (300 mg total) by mouth at bedtime. Patient not taking: Reported on 04/19/2018 01/31/18   Marylin Crosby, MD  sertraline (ZOLOFT) 50 MG tablet Take 1 tablet (50 mg total) by mouth daily. Patient not taking: Reported on 04/19/2018 01/31/18   Marylin Crosby, MD  traZODone (DESYREL) 50 MG tablet Take 1 tablet (50 mg total) by mouth at bedtime as needed for sleep. Patient not taking: Reported on 04/19/2018 01/31/18   Marylin Crosby, MD     Allergies Patient has no known allergies.   Family History  Problem Relation Age of Onset  . Diabetes Mother   . Hypertension Mother   . Drug abuse Father   . Schizophrenia Maternal Aunt     Social History Social History   Tobacco Use  . Smoking status: Current Every Day Smoker    Packs/day: 1.00    Types: Cigarettes  . Smokeless tobacco: Never Used  . Tobacco comment: pt reported  quiting three weeks ago  Substance Use Topics  . Alcohol use: Yes    Alcohol/week: 10.0 standard drinks    Types: 10 Shots of liquor per week    Comment: once a week drinks a bottle of vodka  . Drug use: Yes    Frequency: 1.0 times per week    Types: Cocaine, Marijuana    Comment: last used 3-4 days ago.    Review of Systems  Constitutional:   No fever or chills.  ENT:   No sore throat. No rhinorrhea. Cardiovascular:   No chest pain or syncope. Respiratory:   No dyspnea or cough. Gastrointestinal:   Negative for abdominal  pain, vomiting and diarrhea.  Musculoskeletal:   Negative for focal pain or swelling All other systems reviewed and are negative except as documented above in ROS and HPI.  ____________________________________________   PHYSICAL EXAM:  VITAL SIGNS: ED Triage Vitals  Enc Vitals Group     BP 08/25/18 0902 (!) 154/117     Pulse Rate 08/25/18 0902 77     Resp 08/25/18 0905 20     Temp 08/25/18 0902 97.7 F (36.5 C)     Temp Source 08/25/18 0902 Oral     SpO2 08/25/18 0902 100 %     Weight 08/25/18 0902 160 lb (72.6 kg)     Height 08/25/18 0902 5\' 3"  (1.6 m)     Head Circumference --      Peak Flow --      Pain Score 08/25/18 0905 0     Pain Loc --      Pain Edu? --      Excl. in [place]? --     Vital signs reviewed, nursing assessments reviewed.   Constitutional:   Alert and oriented. Non-toxic appearance. Eyes:   Conjunctivae are normal. EOMI.  ENT      Head:   Normocephalic and atraumatic.        Neck:   No meningismus. Full ROM. Hematological/Lymphatic/Immunilogical:   No cervical lymphadenopathy. Cardiovascular:   RRR. Symmetric bilateral radial and DP pulses.  No murmurs. Cap refill less than 2 seconds. Respiratory:   Normal respiratory effort without tachypnea/retractions. Breath sounds are clear and equal bilaterally. No wheezes/rales/rhonchi. Gastrointestinal:   Soft and nontender. Non distended. There is no CVA tenderness.  No rebound, rigidity, or guarding. Musculoskeletal:   Normal range of motion in all extremities. No joint effusions.  No lower extremity tenderness.  No edema. Neurologic:   Normal speech and language.  Motor grossly intact. No acute focal neurologic deficits are appreciated.  Skin:    Skin is warm, dry and intact. No rash noted.  No petechiae, purpura, or bullae.  No wounds  ____________________________________________    LABS (pertinent positives/negatives) (all labs ordered are listed, but only abnormal results are displayed) Labs Reviewed   COMPREHENSIVE METABOLIC PANEL - Abnormal; Notable for the following components:      Result Value   Calcium 8.4 (*)    All other components within normal limits  ACETAMINOPHEN LEVEL - Abnormal; Notable for the following components:   Acetaminophen (Tylenol), Serum <10 (*)    All other components within normal limits  CBC - Abnormal; Notable for the following components:   RBC 3.77 (*)    Hemoglobin 10.4 (*)    HCT 31.9 (*)    RDW 20.7 (*)    All other components within normal limits  URINE DRUG SCREEN, QUALITATIVE ([facility] ONLY) - Abnormal; Notable for the following components:  Cocaine Metabolite,Ur [place] POSITIVE (*)    Cannabinoid 50 Ng, Ur [place] POSITIVE (*)    Benzodiazepine, Ur Scrn TEST NOT PERFORMED, REAGENT NOT AVAILABLE (*)    All other components within normal limits  ETHANOL  SALICYLATE LEVEL  POC URINE PREG, ED  POCT PREGNANCY, URINE   ____________________________________________   EKG    ____________________________________________    RADIOLOGY  No results found.  ____________________________________________   PROCEDURES Procedures  ____________________________________________    CLINICAL IMPRESSION / ASSESSMENT AND PLAN / ED COURSE  Pertinent labs & imaging results that were available during my care of the patient were reviewed by me and considered in my medical decision making (see chart for details).    Patient nontoxic, medically stable.  Evaluated by psychiatry who advises that they will plan to hospitalize the patient for further management.  No evidence of severe intoxication or withdrawal syndrome at this time.      ____________________________________________   FINAL CLINICAL IMPRESSION(S) / ED DIAGNOSES    Final diagnoses:  Suicidal ideation  Polysubstance abuse [facility])     ED Discharge Orders    None      Portions of this note were generated with dragon dictation software. Dictation errors may occur despite best attempts at  proofreading.    Carrie Mew, MD 08/25/18 [phone]

## 2018-08-25 NOTE — ED Notes (Signed)
Pt is extremely agitated and labile.  Refuses to have blood drawn and to receive a urine sample until she sees a doctor.  Pt states that the staff is making her more agitated by being present and asking her more assessment questions.

## 2018-08-25 NOTE — ED Notes (Signed)
TTS has attempted to assess the pt. Pt states " I am irritable not now". TTS attempted to encourage pt to engage in evaluation without success. TTS will attempt to reassess patient.

## 2018-08-25 NOTE — ED Notes (Signed)
Pt bought back from triage to room 20 in burgundy patient scrubs with 1:1 belongings bag.  Bag contained:  1 jacket (grey and red) without hood attached, 1 orange short sleeve shirt, 1 stocking cap, 1 black sports bra, 1 blue and white stripped dress, 1 burgundy wig, 1 pair of grey flip flops, 3 pairs of blue jeans, 1 pair of wide leg pants (red, black & tan), 1 pair of black and pink pants, and 1 red t-shirt with the word love on the front

## 2018-08-25 NOTE — ED Notes (Signed)
VOL  SEEN  BY  DR  CLAPACS  PENDING  PLACEMENT 

## 2018-08-25 NOTE — BH Assessment (Signed)
Writer obtained voluntary consent form signed by pt.

## 2018-08-26 DIAGNOSIS — F339 Major depressive disorder, recurrent, unspecified: Secondary | ICD-10-CM

## 2018-08-26 LAB — LIPID PANEL
CHOL/HDL RATIO: 2.8 ratio
CHOLESTEROL: 148 mg/dL (ref 0–200)
HDL: 52 mg/dL (ref >40)
LDL Cholesterol: 83 mg/dL (ref 0–99)
TRIGLYCERIDES: 63 mg/dL (ref <150)
VLDL: 13 mg/dL (ref 0–40)

## 2018-08-26 LAB — TSH: TSH: 1.178 u[IU]/mL (ref 0.350–4.500)

## 2018-08-26 LAB — HEMOGLOBIN A1C
Hgb A1c MFr Bld: 5.3 % (ref 4.8–5.6)
Mean Plasma Glucose: 105.41 mg/dL

## 2018-08-26 MED ORDER — FERROUS SULFATE 325 (65 FE) MG PO TABS
325.0000 mg | ORAL_TABLET | Freq: Three times a day (TID) | ORAL | Status: DC
  Administered 2018-08-26 – 2018-08-30 (×14): 325 mg via ORAL
  Filled 2018-08-26 (×14): qty 1

## 2018-08-26 MED ORDER — SERTRALINE HCL 50 MG PO TABS: 50.0000 mg | ORAL_TABLET | Freq: Every day | ORAL | 0 refills | Status: DC

## 2018-08-26 MED ORDER — FERROUS SULFATE 325 (65 FE) MG PO TABS: 325.0000 mg | ORAL_TABLET | Freq: Three times a day (TID) | ORAL | 0 refills | Status: DC

## 2018-08-26 MED ORDER — QUETIAPINE FUMARATE 300 MG PO TABS: 300.0000 mg | ORAL_TABLET | Freq: Every day | ORAL | 0 refills | Status: DC

## 2018-08-26 MED ORDER — TRAZODONE HCL 50 MG PO TABS: 50.0000 mg | ORAL_TABLET | Freq: Every evening | ORAL | 0 refills | Status: DC | PRN

## 2018-08-26 MED ORDER — GABAPENTIN 600 MG PO TABS: 600.0000 mg | ORAL_TABLET | Freq: Three times a day (TID) | ORAL | 0 refills | Status: DC

## 2018-08-26 NOTE — Plan of Care (Signed)
Patient was calm and cooperative most of the shift.Patient got easily irritated at the milieu for the channel changing issues and patient came to staff to get medication to calm down.Denies SI,HI and AVH.Stayed in bed most of the time.Appetite and energy levl good.Compliant with medications.Support and encouragement given.

## 2018-08-26 NOTE — H&P (Signed)
Psychiatric Admission Assessment Adult  Patient Identification: Deborah Melendez MRN:  [id number] Date of Evaluation:  08/26/2018 Chief Complaint:  Depression, suicidal thoughts Principal Diagnosis: Major depressive disorder, recurrent episode with mixed features ([place]) Diagnosis:   Patient Active Problem List   Diagnosis Date Noted  . Major depressive disorder, recurrent episode with mixed features ([place]) [F33.9] 01/27/2018    Priority: High  . Cocaine use disorder, severe, dependence ([place]) [F14.20] 08/08/2015    Priority: High  . Tobacco use disorder [F17.200] 05/22/2017  . Suicidal ideation [R45.851]   . Cannabis use disorder, severe, dependence ([place]) [F12.20] 01/20/2017   History of Present Illness: 42 yo female admitted due to worsening depression and SI. Pt is known to this provider from previous admissions where she presents for very similary symptoms. She was last admitted in February of this year. She states that she was doing well after discharge last time. She took her medications and stayed sober. Things were going well with her boyfriend Laureles. She ran out of medications about 1 month ago and "just didn't go back to [place]." She states that she relapsed and used crack cocaine 3 times and things spiraled. She states that she was having mood swings and "yelling at [facility] a lot. "She states that Kittie Plater has an ICE hearing on Tuesday in [place] and this is making her nervous that he may be deported. Her daughter also started pre-school on Monday and this made her really emotional. She states, "I was yelling at [facility] and I just said I needed to come to get help and get back on my medications." She states that she was feeling overwhelmed and started feeling suicidal. She was quite agitated in the ED on arrival. She states that she is feeling slightly better already today. She had a good night sleep. She feels her medications are working well and wants to continue the same one that she  was on in the past. She is feeling more hopeful today. She states that she wants to discharge Monday so she can attend [place]'s court hearing on Tuesday.   Associated Signs/Symptoms: Depression Symptoms:  depressed mood, anhedonia, insomnia, feelings of worthlessness/guilt, suicidal thoughts without plan, anxiety, (Hypo) Manic Symptoms:  Irritable Mood, Anxiety Symptoms:  Excessive Worry, Psychotic Symptoms:  None PTSD Symptoms: Negative Total Time spent with patient: 45 minutes  Past Psychiatric History: She has had multiple admissions in the past for similary presentations and usually in the context of crack cocaine use. August 2016 (Reported depressive symptoms and HI towards fiance), August 2017 (endorsed depressive symptoms), September 2017 (SI, MDD, on crack cocaine and lacking transportation to get to her appointments), January 2018 Martin Majestic to ER at [facility] and then admitted at [facility]), October 2018 for SI. February 2019-depression in the setting of crack cocaine use. She has long history of poor compliance with follow up and medications.   Is the patient at risk to self? No.  Has the patient been a risk to self in the past 6 months? No.  Has the patient been a risk to self within the distant past? No.  Is the patient a risk to others? No.  Has the patient been a risk to others in the past 6 months? No.  Has the patient been a risk to others within the distant past? No.   Alcohol Screening: Patient refused Alcohol Screening Tool: Yes 1. How often do you have a drink containing alcohol?: Never(not willing to discuss) 2. How many drinks containing alcohol do you  have on a typical day when you are drinking?: 1 or 2(refusing to participate) 3. How often do you have six or more drinks on one occasion?: Never AUDIT-C Score: 0 4. How often during the last year have you found that you were not able to stop drinking once you had started?: Never 5. How often during the last  year have you failed to do what was normally expected from you becasue of drinking?: Never 6. How often during the last year have you needed a first drink in the morning to get yourself going after a heavy drinking session?: Never 7. How often during the last year have you had a feeling of guilt of remorse after drinking?: Never 8. How often during the last year have you been unable to remember what happened the night before because you had been drinking?: Never 9. Have you or someone else been injured as a result of your drinking?: No 10. Has a relative or friend or a doctor or another health worker been concerned about your drinking or suggested you cut down?: No Alcohol Use Disorder Identification Test Final Score (AUDIT): 0 Intervention/Follow-up: AUDIT Score <7 follow-up not indicated Substance Abuse History in the last 12 months:  Yes.  , crack cocaine use-she reports using 3 times in the past month Consequences of Substance Abuse: Medical Consequences:  Depression, SI Family Consequences:  conflict with boyfriend Previous Psychotropic Medications: Yes  Psychological Evaluations: Yes  Past Medical History:  Past Medical History:  Diagnosis Date  . Bipolar 1 disorder, depressed, severe ([place]) 02/04/2017  . Cocaine abuse with cocaine-induced mood disorder ([place]) 07/27/2017  . Depression   . Homicidal ideations   . Manic behavior ([place])   . MDD (major depressive disorder), recurrent severe, without psychosis ([place]) 08/08/2015  . MRSA (methicillin resistant Staphylococcus aureus)    surgery on finger 3 years ago  . Substance or medication-induced bipolar and related disorder with onset during intoxication ([place]) 09/08/2016  . Suicidal ideation   . UTI (urinary tract infection) 10/08/2017    Past Surgical History:  Procedure Laterality Date  . FINGER SURGERY     Family History:  Family History  Problem Relation Age of Onset  . Diabetes Mother   . Hypertension Mother   . Drug abuse Father    . Schizophrenia Maternal Aunt    Family Psychiatric  History: Unknown Tobacco Screening: Have you used any form of tobacco in the last 30 days? (Cigarettes, Smokeless Tobacco, Cigars, and/or Pipes): Patient Refused Screening Social History: Lives in a home with her boyfriend, [place]. She is stressed because he has ICE hearing next week with fears of him getting deported. She has a3 yo daughter, [name]. Her family lives in [place].    Additional Social History:                           Allergies:  No Known Allergies Lab Results:  Results for orders placed or performed during the hospital encounter of 08/25/18 (from the past 48 hour(s))  Hemoglobin A1c     Status: None   Collection Time: 08/25/18 11:25 AM  Result Value Ref Range   Hgb A1c MFr Bld 5.3 4.8 - 5.6 %    Comment: (NOTE) Pre diabetes:          5.7%-6.4% Diabetes:              >6.4% Glycemic control for   <7.0% adults with diabetes    Mean  Plasma Glucose 105.41 mg/dL    Comment: Performed at [facility] [address] [place], [place] [address]  Lipid panel     Status: None   Collection Time: 08/25/18 11:25 AM  Result Value Ref Range   Cholesterol 148 0 - 200 mg/dL   Triglycerides 63 <150 mg/dL   HDL 52 >40 mg/dL   Total CHOL/HDL Ratio 2.8 RATIO   VLDL 13 0 - 40 mg/dL   LDL Cholesterol 83 0 - 99 mg/dL    Comment:        Total Cholesterol/HDL:CHD Risk Coronary Heart Disease Risk Table                     Men   Women  1/2 Average Risk   3.4   3.3  Average Risk       5.0   4.4  2 X Average Risk   9.6   7.1  3 X Average Risk  23.4   11.0        Use the calculated Patient Ratio above and the CHD Risk Table to determine the patient's CHD Risk.        ATP III CLASSIFICATION (LDL):  <100     mg/dL   Optimal  100-129  mg/dL   Near or Above                    Optimal  130-159  mg/dL   Borderline  160-189  mg/dL   High  >190     mg/dL   Very High Performed at [facility], [address] [place], [place] [address]   TSH     Status: None   Collection Time: 08/25/18 11:25 AM  Result Value Ref Range   TSH 1.178 0.350 - 4.500 uIU/mL    Comment: Performed by a 3rd Generation assay with a functional sensitivity of <=0.01 uIU/mL. Performed at [facility], [address] [place], [place] [address]     Blood Alcohol level:  Lab Results  Component Value Date   [facility] <10 08/25/2018   ETH <10 62/69/4854    Metabolic Disorder Labs:  Lab Results  Component Value Date   HGBA1C 5.3 08/25/2018   MPG 105.41 08/25/2018   MPG 114.02 10/09/2017   No results found for: PROLACTIN Lab Results  Component Value Date   CHOL 148 08/25/2018   TRIG 63 08/25/2018   HDL 52 08/25/2018   CHOLHDL 2.8 08/25/2018   VLDL 13 08/25/2018   LDLCALC 83 08/25/2018   LDLCALC 69 10/09/2017    Current Medications: Current Facility-Administered Medications  Medication Dose Route Frequency Provider Last Rate Last Dose  . acetaminophen (TYLENOL) tablet 650 mg  650 mg Oral Q6H PRN Clapacs, John T, MD      . alum & mag hydroxide-simeth (MAALOX/MYLANTA) 200-200-20 MG/5ML suspension 30 mL  30 mL Oral Q4H PRN Clapacs, John T, MD      . ferrous sulfate tablet 325 mg  325 mg Oral TID WC Edwar Coe R, MD      . gabapentin (NEURONTIN) capsule 600 mg  600 mg Oral TID Clapacs, John T, MD   600 mg at 08/26/18 6270  . hydrOXYzine (ATARAX/VISTARIL) tablet 50 mg  50 mg Oral TID PRN Clapacs, [name], MD   50 mg at 08/26/18 0830  . magnesium hydroxide (MILK OF MAGNESIA) suspension 30 mL  30 mL Oral Daily PRN Clapacs, [name], MD      .  QUEtiapine (SEROQUEL) tablet 300 mg  300 mg Oral QHS Clapacs, John T, MD      . sertraline (ZOLOFT) tablet 50 mg  50 mg Oral Daily Clapacs, [name], MD   50 mg at 08/26/18 0829  . traZODone (DESYREL) tablet 100 mg  100 mg Oral QHS PRN Clapacs, [name], MD       PTA Medications: Medications Prior to Admission  Medication Sig Dispense Refill Last Dose  .  azithromycin (ZITHROMAX Z-PAK) 250 MG tablet Take 2 po the first day then once a day for the next 4 days. 6 tablet 0   . ferrous sulfate 325 (65 FE) MG tablet Take 1 tablet (325 mg total) by mouth 3 (three) times daily with meals. (Patient not taking: Reported on 04/19/2018) 90 tablet 0 Not Taking at Unknown time  . gabapentin (NEURONTIN) 600 MG tablet Take 1 tablet (600 mg total) by mouth 3 (three) times daily. (Patient not taking: Reported on 04/19/2018) 90 tablet 0 Not Taking at Unknown time  . predniSONE (DELTASONE) 20 MG tablet Take 3 po QD x 3d , then 2 po QD x 3d then 1 po QD x 3d 18 tablet 0   . QUEtiapine (SEROQUEL) 300 MG tablet Take 1 tablet (300 mg total) by mouth at bedtime. (Patient not taking: Reported on 04/19/2018) 30 tablet 0 Not Taking at Unknown time  . sertraline (ZOLOFT) 50 MG tablet Take 1 tablet (50 mg total) by mouth daily. (Patient not taking: Reported on 04/19/2018) 30 tablet 0 Not Taking at Unknown time  . traZODone (DESYREL) 50 MG tablet Take 1 tablet (50 mg total) by mouth at bedtime as needed for sleep. (Patient not taking: Reported on 04/19/2018) 30 tablet 0 Not Taking at Unknown time    Musculoskeletal: Strength & Muscle Tone: within normal limits Gait & Station: normal Patient leans: N/A  Psychiatric Specialty Exam: Physical Exam  Nursing note and vitals reviewed.   Review of Systems  All other systems reviewed and are negative.   Blood pressure (!) 137/100, pulse 93, temperature (!) 97.5 F (36.4 C), temperature source Oral, resp. rate 18, height 5\' 2"  (1.575 m), weight 67.1 kg, last menstrual period 08/18/2018, SpO2 100 %.Body mass index is 27.07 kg/m.  General Appearance: Disheveled  Eye Contact:  Fair  Speech:  Clear and Coherent  Volume:  Normal  Mood:  Euthymic, smiling today  Affect:  Congruent  Thought Process:  Coherent and Goal Directed  Orientation:  Full (Time, Place, and Person)  Thought Content:  Logical  Suicidal Thoughts:  No  Homicidal  Thoughts:  No  Memory:  Immediate;   Fair  Judgement:  Impaired  Insight:  Fair  Psychomotor Activity:  Normal  Concentration:  Concentration: Fair  Recall:  [employer] of Knowledge:  Fair  Language:  Fair  Akathisia:  No      Assets:  Resilience  ADL's:  Intact  Cognition:  WNL  Sleep:  Number of Hours: 6.45    Treatment Plan Summary: 42 yo female admitted due to depression and SI in the context of medication noncompliance and crack cocaine use. She is known to this provider from past admissions for very similar symptoms. She has been doing fairly well over the past several months and remaining sober and on her medications. Diagnosis is complicated by long history of drug use. She feels her medications are very helpful when she takes them (Zoloft and Seroquel). No clear history of manic or psychosis when not using  drugs and does very well when sober so diagnosis is not likely bipolar disorder or psychotic disorder.  Her main goals are to get back on her medications and get counseling.   Plan:  MDD with mixed features -Restarted on Seroquel 300 mg qhs -Start Zoloft 50 mg daily  Cocaine use disorder -pt does not want residential treatment.   Anemia -Start Ferrous sulfate 325 mg TID  Dispo -She will discharge back to her home when stable. Likely discharge Monday. She will follow up with  [facility]  Observation Level/Precautions:  15 minute checks  Laboratory:  Done in ED and reviewed, anemic  Psychotherapy:    Medications:    Consultations:    Discharge Concerns:    Estimated LOS:  Other:     Physician Treatment Plan for Primary Diagnosis: Major depressive disorder, recurrent episode with mixed features ([place]) Long Term Goal(s): Improvement in symptoms so as ready for discharge  Short Term Goals: Ability to disclose and discuss suicidal ideas and Ability to demonstrate self-control will improve    I certify that inpatient services furnished can reasonably be expected to  improve the patient's condition.    Marylin Crosby, MD 8/30/201911:15 AM

## 2018-08-26 NOTE — BHH Suicide Risk Assessment (Signed)
[facility] INPATIENT:  Family/Significant Other Suicide Prevention Education  Suicide Prevention Education:  Education Completed; Su Grand, [phone] has been identified by the patient as the family member/significant other with whom the patient will be residing, and identified as the person(s) who will aid the patient in the event of a mental health crisis (suicidal ideations/suicide attempt).  With written consent from the patient, the family member/significant other has been provided the following suicide prevention education, prior to the and/or following the discharge of the patient.  The suicide prevention education provided includes the following:  Suicide risk factors  Suicide prevention and interventions  National Suicide Hotline telephone number  [facility] assessment telephone number  [facility] Emergency Assistance [place] and/or Residential Mobile Crisis Unit telephone number  Request made of family/significant other to:  Remove weapons (e.g., guns, rifles, knives), all items previously/currently identified as safety concern.    Remove drugs/medications (over-the-counter, prescriptions, illicit drugs), all items previously/currently identified as a safety concern.  The family member/significant other verbalizes understanding of the suicide prevention education information provided.  The family member/significant other agrees to remove the items of safety concern listed above.  CSW spoke with Clair Gulling the patients friend regarding precipitating factors that led to his admission. He reports the patient has some life issues at home that she has mentioned. He also reports that the patient doesn't take her medications. He says that she does not have access to any guns/weapons in the home.  Darin Engels 08/26/2018, 1:52 PM

## 2018-08-26 NOTE — Progress Notes (Signed)
Patient slept all night but remains irritable, refusing morning services. Guarded and arrogant, cursing upon approach. RN unable to perform EKG. Will report to oncoming nurse.

## 2018-08-26 NOTE — BHH Group Notes (Signed)
LCSW Group Therapy Note  08/26/2018 1:00 pm  Type of Therapy and Topic:  Group Therapy:  Feelings around Relapse and Recovery  Participation Level:  Did Not Attend   Description of Group:    Patients in this group will discuss emotions they experience before and after a relapse. They will process how experiencing these feelings, or avoidance of experiencing them, relates to having a relapse. Facilitator will guide patients to explore emotions they have related to recovery. Patients will be encouraged to process which emotions are more powerful. They will be guided to discuss the emotional reaction significant others in their lives may have to their relapse or recovery. Patients will be assisted in exploring ways to respond to the emotions of others without this contributing to a relapse.  Therapeutic Goals: 1. Patient will identify two or more emotions that lead to a relapse for them 2. Patient will identify two emotions that result when they relapse 3. Patient will identify two emotions related to recovery 4. Patient will demonstrate ability to communicate their needs through discussion and/or role plays   Summary of Patient Progress:  Krystalyn was invited to today's group, but chose not to attend.   Therapeutic Modalities:   Cognitive Behavioral Therapy Solution-Focused Therapy Assertiveness Training Relapse Prevention Therapy   Devona Konig, LCSW 08/26/2018 1:48 PM
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
## 2018-08-26 NOTE — Tx Team (Addendum)
Interdisciplinary Treatment and Diagnostic Plan Update  08/26/2018 Time of Session: 11:30am Conchita Truxillo MRN: [id number]  Principal Diagnosis: <principal problem not specified>  Secondary Diagnoses: Active Problems:   Severe recurrent major depression without psychotic features (HCC)   Current Medications:  Current Facility-Administered Medications  Medication Dose Route Frequency Provider Last Rate Last Dose  . acetaminophen (TYLENOL) tablet 650 mg  650 mg Oral Q6H PRN Clapacs, John T, MD      . alum & mag hydroxide-simeth (MAALOX/MYLANTA) 200-200-20 MG/5ML suspension 30 mL  30 mL Oral Q4H PRN Clapacs, John T, MD      . ferrous sulfate tablet 325 mg  325 mg Oral TID WC McNew, Holly R, MD      . gabapentin (NEURONTIN) capsule 600 mg  600 mg Oral TID Clapacs, John T, MD   600 mg at 08/26/18 2229  . hydrOXYzine (ATARAX/VISTARIL) tablet 50 mg  50 mg Oral TID PRN Clapacs, [name], MD   50 mg at 08/26/18 0830  . magnesium hydroxide (MILK OF MAGNESIA) suspension 30 mL  30 mL Oral Daily PRN Clapacs, John T, MD      . QUEtiapine (SEROQUEL) tablet 300 mg  300 mg Oral QHS Clapacs, John T, MD      . sertraline (ZOLOFT) tablet 50 mg  50 mg Oral Daily Clapacs, [name], MD   50 mg at 08/26/18 0829  . traZODone (DESYREL) tablet 100 mg  100 mg Oral QHS PRN Clapacs, [name], MD       PTA Medications: Medications Prior to Admission  Medication Sig Dispense Refill Last Dose  . azithromycin (ZITHROMAX Z-PAK) 250 MG tablet Take 2 po the first day then once a day for the next 4 days. 6 tablet 0   . ferrous sulfate 325 (65 FE) MG tablet Take 1 tablet (325 mg total) by mouth 3 (three) times daily with meals. (Patient not taking: Reported on 04/19/2018) 90 tablet 0 Not Taking at Unknown time  . gabapentin (NEURONTIN) 600 MG tablet Take 1 tablet (600 mg total) by mouth 3 (three) times daily. (Patient not taking: Reported on 04/19/2018) 90 tablet 0 Not Taking at Unknown time  . predniSONE (DELTASONE) 20 MG  tablet Take 3 po QD x 3d , then 2 po QD x 3d then 1 po QD x 3d 18 tablet 0   . QUEtiapine (SEROQUEL) 300 MG tablet Take 1 tablet (300 mg total) by mouth at bedtime. (Patient not taking: Reported on 04/19/2018) 30 tablet 0 Not Taking at Unknown time  . sertraline (ZOLOFT) 50 MG tablet Take 1 tablet (50 mg total) by mouth daily. (Patient not taking: Reported on 04/19/2018) 30 tablet 0 Not Taking at Unknown time  . traZODone (DESYREL) 50 MG tablet Take 1 tablet (50 mg total) by mouth at bedtime as needed for sleep. (Patient not taking: Reported on 04/19/2018) 30 tablet 0 Not Taking at Unknown time    Patient Stressors: Financial difficulties Medication change or noncompliance Substance abuse  Patient Strengths: Ability for insight Communication skills  Treatment Modalities: Medication Management, Group therapy, Case management,  1 to 1 session with clinician, Psychoeducation, Recreational therapy.   Physician Treatment Plan for Primary Diagnosis: <principal problem not specified> Long Term Goal(s):     Short Term Goals:    Medication Management: Evaluate patient's response, side effects, and tolerance of medication regimen.  Therapeutic Interventions: 1 to 1 sessions, Unit Group sessions and Medication administration.  Evaluation of Outcomes: Progressing  Physician Treatment Plan for Secondary  Diagnosis: Active Problems:   Severe recurrent major depression without psychotic features ([place])  Long Term Goal(s):     Short Term Goals:       Medication Management: Evaluate patient's response, side effects, and tolerance of medication regimen.  Therapeutic Interventions: 1 to 1 sessions, Unit Group sessions and Medication administration.  Evaluation of Outcomes: Progressing   RN Treatment Plan for Primary Diagnosis: <principal problem not specified> Long Term Goal(s): Knowledge of disease and therapeutic regimen to maintain health will improve  Short Term Goals: Ability to demonstrate  self-control, Ability to disclose and discuss suicidal ideas, Ability to identify and develop effective coping behaviors will improve and Compliance with prescribed medications will improve  Medication Management: RN will administer medications as ordered by provider, will assess and evaluate patient's response and provide education to patient for prescribed medication. RN will report any adverse and/or side effects to prescribing provider.  Therapeutic Interventions: 1 on 1 counseling sessions, Psychoeducation, Medication administration, Evaluate responses to treatment, Monitor vital signs and CBGs as ordered, Perform/monitor CIWA, COWS, AIMS and Fall Risk screenings as ordered, Perform wound care treatments as ordered.  Evaluation of Outcomes: Progressing   LCSW Treatment Plan for Primary Diagnosis: <principal problem not specified> Long Term Goal(s): Safe transition to appropriate next level of care at discharge, Engage patient in therapeutic group addressing interpersonal concerns.  Short Term Goals: Engage patient in aftercare planning with referrals and resources, Increase social support, Identify triggers associated with mental health/substance abuse issues and Increase skills for wellness and recovery  Therapeutic Interventions: Assess for all discharge needs, 1 to 1 time with Social worker, Explore available resources and support systems, Assess for adequacy in community support network, Educate family and significant other(s) on suicide prevention, Complete Psychosocial Assessment, Interpersonal group therapy.  Evaluation of Outcomes: Progressing   Progress in Treatment: Attending groups: No. Participating in groups: No. Taking medication as prescribed: Yes. Toleration medication: Yes. Family/Significant other contact made: No, will contact:  Family if patient gives consent Patient understands diagnosis: Yes. Discussing patient identified problems/goals with staff: Yes. Medical  problems stabilized or resolved: Yes. Denies suicidal/homicidal ideation: Yes. Issues/concerns per patient self-inventory: No. Other:   New problem(s) identified: No, Describe:  None  New Short Term/Long Term Goal(s): "To get back on my medications and get counseling."  Patient Goals:  "To get back on my medications and get counseling."  Discharge Plan or Barriers: To return home and [facility] with [facility]  Reason for Continuation of Hospitalization: Depression Medication stabilization  Estimated Length of Stay: 7 days  Recreational Therapy: Patient Stressors: N/A Patient Goal: Patient will engage in interactions with peers and staff in pro-social manner at least 2x within 5 recreation therapy group sessions  Attendees: Patient: Harlene Petralia 08/26/2018 11:30 AM  Physician: Amador Cunas, MD 08/26/2018 11:30 AM  Nursing:  08/26/2018 11:30 AM  RN Care Manager: 08/26/2018 11:30 AM  Social Worker: Darin Engels, [place] 08/26/2018 11:30 AM  Recreational Therapist: Isaias Sakai. Toniqua Melamed CTRS, LRT 08/26/2018 11:30 AM  Other:  08/26/2018 11:30 AM  Other:  08/26/2018 11:30 AM  Other: 08/26/2018 11:30 AM    Scribe for Treatment Team: Darin Engels, LCSW 08/26/2018 11:30 AM

## 2018-08-26 NOTE — BHH Suicide Risk Assessment (Signed)
[facility] Admission Suicide Risk Assessment   Nursing information obtained from:  Patient Demographic factors:  Unemployed, Low socioeconomic status Current Mental Status:  NA Loss Factors:  NA Historical Factors:  NA Risk Reduction Factors:  NA  Total Time spent with patient: 45 minutes Principal Problem: Major depressive disorder, recurrent episode with mixed features ([place]) Diagnosis:   Patient Active Problem List   Diagnosis Date Noted  . Major depressive disorder, recurrent episode with mixed features ([place]) [F33.9] 01/27/2018    Priority: High  . Cocaine use disorder, severe, dependence ([place]) [F14.20] 08/08/2015    Priority: High  . Tobacco use disorder [F17.200] 05/22/2017  . Suicidal ideation [R45.851]   . Cannabis use disorder, severe, dependence ([place]) [F12.20] 01/20/2017   Subjective Data: See H&P  Continued Clinical Symptoms:  Alcohol Use Disorder Identification Test Final Score (AUDIT): 0 The "Alcohol Use Disorders Identification Test", Guidelines for Use in Primary Care, Second Edition.  World Pharmacologist [facility]). Score between 0-7:  no or low risk or alcohol related problems. Score between 8-15:  moderate risk of alcohol related problems. Score between 16-19:  high risk of alcohol related problems. Score 20 or above:  warrants further diagnostic evaluation for alcohol dependence and treatment.   CLINICAL FACTORS:   Alcohol/Substance Abuse/Dependencies     COGNITIVE FEATURES THAT CONTRIBUTE TO RISK:  None    SUICIDE RISK:   Moderate:  Frequent suicidal ideation with limited intensity, and duration, some specificity in terms of plans, no associated intent, good self-control, limited dysphoria/symptomatology, some risk factors present, and identifiable protective factors, including available and accessible social support.  PLAN OF CARE: See H&P  I certify that inpatient services furnished can reasonably be expected to improve the patient's condition.   Marylin Crosby, MD 08/26/2018, 11:58 AM

## 2018-08-26 NOTE — Progress Notes (Signed)
Recreation Therapy Notes  INPATIENT RECREATION THERAPY ASSESSMENT  Patient Details Name: Aislynn Cifelli MRN: [id number] DOB: 13-Dec-1976 Today's Date: 08/26/2018       Information Obtained From: (Patient refused assessment)  Able to Participate in Assessment/Interview:    Patient Presentation:    Reason for Admission (Per Patient):    Patient Stressors:    Coping Skills:      Leisure Interests (2+):     Frequency of Recreation/Participation:    Awareness of Community Resources:     [employer]:     Current Use:    If no, Barriers?:    Expressed Interest in [place] of Residence:     Patient Main Form of Transportation:    Patient Strengths:     Patient Identified Areas of Improvement:     Patient Goal for Hospitalization:     Current SI (including self-harm):     Current HI:     Current AVH:    Staff Intervention Plan:    Consent to Intern Participation:    Zakariyya Helfman 08/26/2018, 3:07 PM

## 2018-08-26 NOTE — Progress Notes (Signed)
Recreation Therapy Notes   Date: 08/26/2018  Time: 9:30 pm  Location: Outside  Behavioral response: Appropriate  Group Type: Leisure  Participation level: Active  Communication: Patient was social with peers and staff. Patient came to group late due to unknown reasons.   Comments: N/A  [name] LRT/CTRS        Deborah Melendez 08/26/2018 1:04 PM

## 2018-08-27 DIAGNOSIS — F1420 Cocaine dependence, uncomplicated: Secondary | ICD-10-CM

## 2018-08-27 MED ORDER — SERTRALINE HCL 100 MG PO TABS
100.0000 mg | ORAL_TABLET | Freq: Every day | ORAL | Status: DC
  Administered 2018-08-28 – 2018-08-30 (×3): 100 mg via ORAL
  Filled 2018-08-27 (×3): qty 1

## 2018-08-27 NOTE — Progress Notes (Signed)
[facility] MD Progress Note  08/27/2018 11:22 AM Deborah Melendez  MRN:  [id number] Subjective:  Pt asked to talk to the writer in the office and reports feeling very anxious and panic, and wants some medicine. She said that there is no trigger and she just woke up.  She said that "I snapped out at the other guy yesterday over the TV remote", "I can't control it!'  She is informed that she has hydroxyzine PRN and she can ask the RN for it if that happens again.  We also did deep breathing in the office with positive result.   She is also worried that she would not be able to leave the hospital on Monday as plan.  She is instructed not to think about that yet as she still has 2 days on the unit to sort things out, and she is encouraged to discuss with Dr. Wonda Olds again on Monday.  She is agreeable with the plan.    Principal Problem: Major depressive disorder, recurrent episode with mixed features ([place]) Diagnosis:   Patient Active Problem List   Diagnosis Date Noted  . Major depressive disorder, recurrent episode with mixed features ([place]) [F33.9] 01/27/2018  . Tobacco use disorder [F17.200] 05/22/2017  . Suicidal ideation [R45.851]   . Cannabis use disorder, severe, dependence ([place]) [F12.20] 01/20/2017  . Cocaine use disorder, severe, dependence ([place]) [F14.20] 08/08/2015   Total Time spent with patient: 20 minutes  Past Psychiatric History:  She has had multiple admissions in the past for similary presentations and usually in the context of crack cocaine use. August 2016 (Reporteddepressive symptoms and HI towards fiance), August 2017 (endorsed depressive symptoms), September 2017 (SI, MDD, on crack cocaine and lacking transportation to get to her appointments), January 2018 Martin Majestic to ER at [facility] and then admitted at [facility]), October 2018 for SI. February 2019-depression in the setting of crack cocaine use. She has long history of poor compliance with follow up and  medications  Past Medical History:  Past Medical History:  Diagnosis Date  . Bipolar 1 disorder, depressed, severe ([place]) 02/04/2017  . Cocaine abuse with cocaine-induced mood disorder ([place]) 07/27/2017  . Depression   . Homicidal ideations   . Manic behavior ([place])   . MDD (major depressive disorder), recurrent severe, without psychosis ([place]) 08/08/2015  . MRSA (methicillin resistant Staphylococcus aureus)    surgery on finger 3 years ago  . Substance or medication-induced bipolar and related disorder with onset during intoxication ([place]) 09/08/2016  . Suicidal ideation   . UTI (urinary tract infection) 10/08/2017    Past Surgical History:  Procedure Laterality Date  . FINGER SURGERY     Family History:  Family History  Problem Relation Age of Onset  . Diabetes Mother   . Hypertension Mother   . Drug abuse Father   . Schizophrenia Maternal Aunt    Family Psychiatric  History:  Social History:  Social History   Substance and Sexual Activity  Alcohol Use Yes  . Alcohol/week: 10.0 standard drinks  . Types: 10 Shots of liquor per week   Comment: once a week drinks a bottle of vodka     Social History   Substance and Sexual Activity  Drug Use Yes  . Frequency: 1.0 times per week  . Types: Cocaine, Marijuana   Comment: last used 3-4 days ago.    Social History   Socioeconomic History  . Marital status: Single    Spouse name: Not on file  . Number of  children: Not on file  . Years of education: Not on file  . Highest education level: Not on file  Occupational History  . Not on file  Social Needs  . Financial resource strain: Not on file  . Food insecurity:    Worry: Not on file    Inability: Not on file  . Transportation needs:    Medical: Not on file    Non-medical: Not on file  Tobacco Use  . Smoking status: Current Every Day Smoker    Packs/day: 1.00    Types: Cigarettes  . Smokeless tobacco: Never Used  . Tobacco comment: pt reported quiting three weeks ago   Substance and Sexual Activity  . Alcohol use: Yes    Alcohol/week: 10.0 standard drinks    Types: 10 Shots of liquor per week    Comment: once a week drinks a bottle of vodka  . Drug use: Yes    Frequency: 1.0 times per week    Types: Cocaine, Marijuana    Comment: last used 3-4 days ago.  Marland Kitchen Sexual activity: Yes    Birth control/protection: None  Lifestyle  . Physical activity:    Days per week: Not on file    Minutes per session: Not on file  . Stress: Not on file  Relationships  . Social connections:    Talks on phone: Not on file    Gets together: Not on file    Attends religious service: Not on file    Active member of club or organization: Not on file    Attends meetings of clubs or organizations: Not on file    Relationship status: Not on file  Other Topics Concern  . Not on file  Social History Narrative  . Not on file   Additional Social History:   Lives in a home with her boyfriend, [place]. She is stressed because Kmari Halter has ICE hearing next week with fears of him getting deported. She has a3 yo daughter, [name]. Her family lives in [place].    Sleep: Good  Appetite:  Good  Current Medications: Current Facility-Administered Medications  Medication Dose Route Frequency Provider Last Rate Last Dose  . acetaminophen (TYLENOL) tablet 650 mg  650 mg Oral Q6H PRN Clapacs, [name], MD   650 mg at 08/27/18 1107  . alum & mag hydroxide-simeth (MAALOX/MYLANTA) 200-200-20 MG/5ML suspension 30 mL  30 mL Oral Q4H PRN Clapacs, John T, MD      . ferrous sulfate tablet 325 mg  325 mg Oral TID WC McNew, Tyson Babinski, MD   325 mg at 08/27/18 1107  . gabapentin (NEURONTIN) capsule 600 mg  600 mg Oral TID Clapacs, John T, MD   600 mg at 08/27/18 1107  . hydrOXYzine (ATARAX/VISTARIL) tablet 50 mg  50 mg Oral TID PRN Clapacs, [name], MD   50 mg at 08/27/18 1107  . magnesium hydroxide (MILK OF MAGNESIA) suspension 30 mL  30 mL Oral Daily PRN Clapacs, John T, MD      . QUEtiapine (SEROQUEL)  tablet 300 mg  300 mg Oral QHS Clapacs, John T, MD   300 mg at 08/26/18 2218  . sertraline (ZOLOFT) tablet 50 mg  50 mg Oral Daily Clapacs, John T, MD   50 mg at 08/27/18 0800  . traZODone (DESYREL) tablet 100 mg  100 mg Oral QHS PRN Clapacs, [name], MD   100 mg at 08/26/18 2218    Lab Results:  Results for orders placed or performed during the  hospital encounter of 08/25/18 (from the past 48 hour(s))  Hemoglobin A1c     Status: None   Collection Time: 08/25/18 11:25 AM  Result Value Ref Range   Hgb A1c MFr Bld 5.3 4.8 - 5.6 %    Comment: (NOTE) Pre diabetes:          5.7%-6.4% Diabetes:              >6.4% Glycemic control for   <7.0% adults with diabetes    Mean Plasma Glucose 105.41 mg/dL    Comment: Performed at [facility] [address], [place], [place] [address]  Lipid panel     Status: None   Collection Time: 08/25/18 11:25 AM  Result Value Ref Range   Cholesterol 148 0 - 200 mg/dL   Triglycerides 63 <150 mg/dL   HDL 52 >40 mg/dL   Total CHOL/HDL Ratio 2.8 RATIO   VLDL 13 0 - 40 mg/dL   LDL Cholesterol 83 0 - 99 mg/dL    Comment:        Total Cholesterol/HDL:CHD Risk Coronary Heart Disease Risk Table                     Men   Women  1/2 Average Risk   3.4   3.3  Average Risk       5.0   4.4  2 X Average Risk   9.6   7.1  3 X Average Risk  23.4   11.0        Use the calculated Patient Ratio above and the CHD Risk Table to determine the patient's CHD Risk.        ATP III CLASSIFICATION (LDL):  <100     mg/dL   Optimal  100-129  mg/dL   Near or Above                    Optimal  130-159  mg/dL   Borderline  160-189  mg/dL   High  >190     mg/dL   Very High Performed at [facility], [address] [place], [place] [address]   TSH     Status: None   Collection Time: 08/25/18 11:25 AM  Result Value Ref Range   TSH 1.178 0.350 - 4.500 uIU/mL    Comment: Performed by a 3rd Generation assay with a functional sensitivity of <=0.01  uIU/mL. Performed at [facility], [address] [place], [place] [address]     Blood Alcohol level:  Lab Results  Component Value Date   [facility] <10 08/25/2018   ETH <10 32/20/2542    Metabolic Disorder Labs: Lab Results  Component Value Date   HGBA1C 5.3 08/25/2018   MPG 105.41 08/25/2018   MPG 114.02 10/09/2017   No results found for: PROLACTIN Lab Results  Component Value Date   CHOL 148 08/25/2018   TRIG 63 08/25/2018   HDL 52 08/25/2018   CHOLHDL 2.8 08/25/2018   VLDL 13 08/25/2018   LDLCALC 83 08/25/2018   LDLCALC 69 10/09/2017    Physical Findings: AIMS:  , ,  ,  ,    CIWA:  CIWA-Ar Total: 4 COWS:  COWS Total Score: 1  Musculoskeletal: Strength & Muscle Tone: within normal limits Gait & Station: normal Patient leans: N/A  Psychiatric Specialty Exam: Physical Exam  ROS  Blood pressure (!) 125/95, pulse 82, temperature 98.7 F (37.1 C), temperature source Oral, resp. rate 18, height 5\' 2"  (  1.575 m), weight 67.1 kg, last menstrual period 08/18/2018, SpO2 100 %.Body mass index is 27.07 kg/m.  General Appearance: Fairly Groomed  Eye Contact:  Good  Speech:  Clear and Coherent  Volume:  Normal  Mood:  Anxious  Affect:  Congruent, Constricted and Depressed  Thought Process:  Coherent  Orientation:  Full (Time, Place, and Person)  Thought Content:  Logical  Suicidal Thoughts:  No  Homicidal Thoughts:  No  Memory:  Immediate;   Fair Recent;   Fair Remote;   Fair  Judgement:  Fair  Insight:  Fair  Psychomotor Activity:  Normal  Concentration:  Concentration: Fair and Attention Span: Fair  Recall:  Good  Fund of Knowledge:  Fair  Language:  Good  Akathisia:  NA  Handed:  n/a  AIMS (if indicated):     Assets:  Communication Skills Desire for Improvement  ADL's:  Intact  Cognition:  WNL  Sleep:  Number of Hours: 7.5     Treatment Plan Summary: Daily contact with patient to assess and evaluate symptoms and progress in treatment   42  yo female admitted due to depression and SI in the context of medication noncompliance and crack cocaine use. She is known to this provider from past admissions for very similar symptoms. She has been doing fairly well over the past several months and remaining sober and on her medications. Diagnosis is complicated by long history of drug use. She feels her medications are very helpful when she takes them (Zoloft and Seroquel). No clear history of manic or psychosis when not using drugs and does very well when sober so diagnosis is not likely bipolar disorder or psychotic disorder.  Her main goals are to get back on her medications and get counseling.   Plan:  MDD with mixed features - continue Seroquel 300 mg qhs - increase Zoloft to 100 mg daily  Cocaine use disorder - pt does not want residential treatment.  - encourage SAIOP if possible.   Anemia - continue Ferrous sulfate 325 mg TID  Dispo -She will discharge back to her home when stable. Planned discharge this coming Monday, but pt is have doubts about her readiness by then. She is encouraged to discuss with Dr. Wonda Olds on Monday.  She will follow up with  [facility] Ulonda Klosowski, MD 08/27/2018, 11:22 AM

## 2018-08-27 NOTE — Progress Notes (Signed)
Patient alert and oriented x 4, denies SI/HI/AVH, mood is irritable , angry and hostile towards staff. Patient is unwilling to participate in treatment plan. Patient appears anxious, no interacting appropriately with peers and staff. Patient was given support and encouraged to go t evening wrap up group. Patient does not go to evening group. Patient is complaint with medication will continue to closely monitor. Deborah Melendez

## 2018-08-27 NOTE — [facility] Group Notes (Signed)
LCSW Group Therapy Note  08/27/2018 1:15pm  Type of Therapy and Topic:  Group Therapy:  Cognitive Distortions  Participation Level:  Did Not Attend   Description of Group:    Patients in this group will be introduced to the topic of cognitive distortions.  Patients will identify and describe cognitive distortions, describe the feelings these distortions create for them.  Patients will identify one or more situations in their personal life where they have cognitively distorted thinking and will verbalize challenging this cognitive distortion through positive thinking skills.  Patients will practice the skill of using positive affirmations to challenge cognitive distortions using affirmation cards.    Therapeutic Goals:  1. Patient will identify two or more cognitive distortions they have used 2. Patient will identify one or more emotions that stem from use of a cognitive distortion 3. Patient will demonstrate use of a positive affirmation to counter a cognitive distortion through discussion and/or role play. 4. Patient will describe one way cognitive distortions can be detrimental to wellness   Summary of Patient Progress: Pt was invited to attend group but chose not to attend. CSW will continue to encourage pt to attend group throughout their admission.      Therapeutic Modalities:   Cognitive Behavioral Therapy Motivational Interviewing   Westlynn Fifer  CUEBAS-COLON, LCSW 08/27/2018 12:48 PM

## 2018-08-27 NOTE — Plan of Care (Signed)
Patient able to verbalize understanding of information received . Emotional and mental status improving . Encourage patient  attendance to unit programing . Working on Scientific laboratory technician . Encourage  appropriate ADL's , handwashing . Voice  to writer  no safety concerns .  Problem: Education: Goal: Knowledge of [facility] Education information/materials will improve Outcome: Progressing Goal: Emotional status will improve Outcome: Progressing Goal: Mental status will improve Outcome: Progressing Goal: Verbalization of understanding the information provided will improve Outcome: Progressing   Problem: Education: Goal: Utilization of techniques to improve thought processes will improve Outcome: Progressing   Problem: Activity: Goal: Interest or engagement in leisure activities will improve Outcome: Progressing   Problem: Coping: Goal: Coping ability will improve Outcome: Progressing Goal: Will verbalize feelings Outcome: Progressing   Problem: Education: Goal: Ability to state activities that reduce stress will improve Outcome: Progressing   Problem: Coping: Goal: Ability to identify and develop effective coping behavior will improve Outcome: Progressing   Problem: Education: Goal: Ability to incorporate positive changes in behavior to improve self-esteem will improve Outcome: Progressing   Problem: Health Behavior/Discharge Planning: Goal: Ability to remain free from injury will improve Outcome: Progressing   Problem: Skin Integrity: Goal: Demonstration of wound healing without infection will improve Outcome: Progressing

## 2018-08-27 NOTE — Plan of Care (Signed)
  Problem: Education: Goal: Knowledge of [facility] Education information/materials will improve Outcome: Progressing  Patient is improving with the information.

## 2018-08-27 NOTE — Progress Notes (Addendum)
D: Affect pleasant on approach . Patient stated appetite poor at meals , but eating 100% of meals    Patient slept between  Breakfast and lunch  Woke up voice of being very anxious . Received medications and instructions on breathing, and counting for anxiety . Voice of continued use with drugs . Voiced of arguments  With boyfriend  About  Her usage . Stated she uses her own money. Instructed   Money in the house whole  Is not just slotted for  Her drug use and this is where they argue. Patient denies  Suicidal ideations  . Stated she has to go to court on  Thursday  With her boyfriend for imagration purposes .No auditory hallucinations  No pain concerns . Appropriate ADL'S. Interacting with peers and staff.   A: Encourage patient participation with unit programming . Instruction  Given on  Medication , verbalize understanding.  R: Voice no other concerns. Staff continue to monitor

## 2018-08-28 NOTE — Plan of Care (Signed)
  Problem: Education: Goal: Utilization of techniques to improve thought processes will improve Outcome: Progressing   

## 2018-08-28 NOTE — Plan of Care (Signed)
.   Emotional and mental status improving . Encourage patient  attendance to unit programing . Working on Scientific laboratory technician . Encourage  appropriate ADL's , handwashing . Voice  to writer  no safety concerns Patient able to verbalize understanding of information received  information given in concrete form .   Problem: Education: Goal: Knowledge of  General Education information/materials will improve Outcome: Progressing Goal: Emotional status will improve Outcome: Progressing Goal: Mental status will improve Outcome: Progressing Goal: Verbalization of understanding the information provided will improve Outcome: Progressing   Problem: Education: Goal: Utilization of techniques to improve thought processes will improve Outcome: Progressing   Problem: Activity: Goal: Interest or engagement in leisure activities will improve Outcome: Progressing   Problem: Coping: Goal: Coping ability will improve Outcome: Progressing Goal: Will verbalize feelings Outcome: Progressing   Problem: Education: Goal: Ability to state activities that reduce stress will improve Outcome: Progressing   Problem: Coping: Goal: Ability to identify and develop effective coping behavior will improve Outcome: Progressing   Problem: Education: Goal: Ability to incorporate positive changes in behavior to improve self-esteem will improve Outcome: Progressing   Problem: Health Behavior/Discharge Planning: Goal: Ability to remain free from injury will improve Outcome: Progressing   Problem: Skin Integrity: Goal: Demonstration of wound healing without infection will improve Outcome: Progressing

## 2018-08-28 NOTE — Progress Notes (Signed)
D: Patient stated slept poor last night .Stated appetite is good and energy level  Is normal. Stated concentration poor . Stated on Depression scale 8 , hopeless 8 and anxiety 10.( low 0-10 high) Denies suicidal  homicidal ideations  .  No auditory hallucinations  No pain concerns . Appropriate ADL'S. Interacting with peers and staff.  Emotional and mental status improving . Encourage patient attendance to unit programing . Working on Radiographer, therapeutic . Encourage appropriate ADL's , handwashing . Voice to writer no safety concerns Patient able to verbalize understanding of information received  information given in concrete form . Patient continue to voice of not being ready to go home  Stated her medications are not  right . Patient  Stated when she went outside  wirth her peers  She went off on staff . Patient came back in upset that she did this , but did not show ownership  Of her actions. Patient blamed staff.     A: Encourage patient participation with unit programming . Instruction  Given on  Medication , verbalize understanding.  Explanation of behavior  And requested  Patient  apologized   Patient able to show insight into her behavior   R: Voice no other concerns. Staff continue to monitor

## 2018-08-28 NOTE — Progress Notes (Signed)
Patient alert and oriented x 4, denies SI/HI/AVH, affect is flat mood is blunted, she appears less irritable, but  hostile towards staff. Patient is willing to participate in treatment plan. Patient appears less anxious,  interacting appropriately with peers and staff. Patient was given support and encouraged to go to evening wrap up group. Patient did not go to evening wrap up group. Patient is complaint with medication , no loud outburst or bizarre behavior will continue to closely monitor. Deborah Melendez

## 2018-08-28 NOTE — Progress Notes (Signed)
[facility] MD Progress Note  08/28/2018 1:31 PM Deborah Melendez  MRN:  [id number] Subjective: Pt is seen in her room. She went back to her bed after breakfast, and did not want to social with her peers.  While she still subjectively reports anxiety, she was seen comfortably lying in bed.  She has been using her hydroxyzine 50mg  about 2x a day.   Denied SI.   Principal Problem: Major depressive disorder, recurrent episode with mixed features ([place]) Diagnosis:   Patient Active Problem List   Diagnosis Date Noted  . Major depressive disorder, recurrent episode with mixed features ([place]) [F33.9] 01/27/2018  . Tobacco use disorder [F17.200] 05/22/2017  . Suicidal ideation [R45.851]   . Cannabis use disorder, severe, dependence ([place]) [F12.20] 01/20/2017  . Cocaine use disorder, severe, dependence ([place]) [F14.20] 08/08/2015   Total Time spent with patient: 20 minutes  Past Psychiatric History:  She has had multiple admissions in the past for similary presentations and usually in the context of crack cocaine use. August 2016 (Reporteddepressive symptoms and HI towards fiance), August 2017 (endorsed depressive symptoms), September 2017 (SI, MDD, on crack cocaine and lacking transportation to get to her appointments), January 2018 Martin Majestic to ER at [facility] and then admitted at [facility]), October 2018 for SI. February 2019-depression in the setting of crack cocaine use. She has long history of poor compliance with follow up and medications  Past Medical History:  Past Medical History:  Diagnosis Date  . Bipolar 1 disorder, depressed, severe ([place]) 02/04/2017  . Cocaine abuse with cocaine-induced mood disorder ([place]) 07/27/2017  . Depression   . Homicidal ideations   . Manic behavior ([place])   . MDD (major depressive disorder), recurrent severe, without psychosis ([place]) 08/08/2015  . MRSA (methicillin resistant Staphylococcus aureus)    surgery on finger 3 years ago  . Substance or  medication-induced bipolar and related disorder with onset during intoxication ([place]) 09/08/2016  . Suicidal ideation   . UTI (urinary tract infection) 10/08/2017    Past Surgical History:  Procedure Laterality Date  . FINGER SURGERY     Family History:  Family History  Problem Relation Age of Onset  . Diabetes Mother   . Hypertension Mother   . Drug abuse Father   . Schizophrenia Maternal Aunt    Family Psychiatric  History:  Social History:  Social History   Substance and Sexual Activity  Alcohol Use Yes  . Alcohol/week: 10.0 standard drinks  . Types: 10 Shots of liquor per week   Comment: once a week drinks a bottle of vodka     Social History   Substance and Sexual Activity  Drug Use Yes  . Frequency: 1.0 times per week  . Types: Cocaine, Marijuana   Comment: last used 3-4 days ago.    Social History   Socioeconomic History  . Marital status: Single    Spouse name: Not on file  . Number of children: Not on file  . Years of education: Not on file  . Highest education level: Not on file  Occupational History  . Not on file  Social Needs  . Financial resource strain: Not on file  . Food insecurity:    Worry: Not on file    Inability: Not on file  . Transportation needs:    Medical: Not on file    Non-medical: Not on file  Tobacco Use  . Smoking status: Current Every Day Smoker    Packs/day: 1.00    Types: Cigarettes  .  Smokeless tobacco: Never Used  . Tobacco comment: pt reported quiting three weeks ago  Substance and Sexual Activity  . Alcohol use: Yes    Alcohol/week: 10.0 standard drinks    Types: 10 Shots of liquor per week    Comment: once a week drinks a bottle of vodka  . Drug use: Yes    Frequency: 1.0 times per week    Types: Cocaine, Marijuana    Comment: last used 3-4 days ago.  Marland Kitchen Sexual activity: Yes    Birth control/protection: None  Lifestyle  . Physical activity:    Days per week: Not on file    Minutes per session: Not on file  .  Stress: Not on file  Relationships  . Social connections:    Talks on phone: Not on file    Gets together: Not on file    Attends religious service: Not on file    Active member of club or organization: Not on file    Attends meetings of clubs or organizations: Not on file    Relationship status: Not on file  Other Topics Concern  . Not on file  Social History Narrative  . Not on file   Additional Social History:   Lives in a home with her boyfriend, [place]. She is stressed because Allie Gerhold has ICE hearing next week with fears of him getting deported. She has a3 yo daughter, [name]. Her family lives in [place].    Sleep: Good  Appetite:  Good  Current Medications: Current Facility-Administered Medications  Medication Dose Route Frequency Provider Last Rate Last Dose  . acetaminophen (TYLENOL) tablet 650 mg  650 mg Oral Q6H PRN Clapacs, [name], MD   650 mg at 08/27/18 2031  . alum & mag hydroxide-simeth (MAALOX/MYLANTA) 200-200-20 MG/5ML suspension 30 mL  30 mL Oral Q4H PRN Clapacs, John T, MD      . ferrous sulfate tablet 325 mg  325 mg Oral TID WC McNew, Tyson Babinski, MD   325 mg at 08/28/18 1155  . gabapentin (NEURONTIN) capsule 600 mg  600 mg Oral TID Clapacs, John T, MD   600 mg at 08/28/18 1155  . hydrOXYzine (ATARAX/VISTARIL) tablet 50 mg  50 mg Oral TID PRN Clapacs, [name], MD   50 mg at 08/27/18 2031  . magnesium hydroxide (MILK OF MAGNESIA) suspension 30 mL  30 mL Oral Daily PRN Clapacs, John T, MD      . QUEtiapine (SEROQUEL) tablet 300 mg  300 mg Oral QHS Clapacs, John T, MD   300 mg at 08/27/18 2121  . sertraline (ZOLOFT) tablet 100 mg  100 mg Oral Daily Alixandria Friedt, MD   100 mg at 08/28/18 0803  . traZODone (DESYREL) tablet 100 mg  100 mg Oral QHS PRN Clapacs, [name], MD   100 mg at 08/27/18 2121    Lab Results:  No results found for this or any previous visit (from the past 48 hour(s)).  Blood Alcohol level:  Lab Results  Component Value Date   ETH <10 08/25/2018   ETH <10  64/68/0321    Metabolic Disorder Labs: Lab Results  Component Value Date   HGBA1C 5.3 08/25/2018   MPG 105.41 08/25/2018   MPG 114.02 10/09/2017   No results found for: PROLACTIN Lab Results  Component Value Date   CHOL 148 08/25/2018   TRIG 63 08/25/2018   HDL 52 08/25/2018   CHOLHDL 2.8 08/25/2018   VLDL 13 08/25/2018   LDLCALC 83 08/25/2018  [place] 69 10/09/2017    Physical Findings: AIMS:  , ,  ,  ,    CIWA:  CIWA-Ar Total: 4 COWS:  COWS Total Score: 1  Musculoskeletal: Strength & Muscle Tone: within normal limits Gait & Station: normal Patient leans: N/A  Psychiatric Specialty Exam: Physical Exam  ROS  Blood pressure 115/72, pulse 89, temperature 98.6 F (37 C), temperature source Oral, resp. rate 16, height 5\' 2"  (1.575 m), weight 67.1 kg, last menstrual period 08/18/2018, SpO2 100 %.Body mass index is 27.07 kg/m.  General Appearance: Fairly Groomed  Eye Contact:  Good  Speech:  Clear and Coherent  Volume:  Normal  Mood:  Anxious  Affect:  Congruent, Constricted and Depressed  Thought Process:  Coherent  Orientation:  Full (Time, Place, and Person)  Thought Content:  Logical  Suicidal Thoughts:  No  Homicidal Thoughts:  No  Memory:  Immediate;   Fair Recent;   Fair Remote;   Fair  Judgement:  Fair  Insight:  Fair  Psychomotor Activity:  Normal  Concentration:  Concentration: Fair and Attention Span: Fair  Recall:  Good  Fund of Knowledge:  Fair  Language:  Good  Akathisia:  NA  Handed:  n/a  AIMS (if indicated):     Assets:  Communication Skills Desire for Improvement  ADL's:  Intact  Cognition:  WNL  Sleep:  Number of Hours: 7.5     Treatment Plan Summary: Daily contact with patient to assess and evaluate symptoms and progress in treatment   42 yo female admitted due to depression and SI in the context of medication noncompliance and crack cocaine use. She is known to this provider from past admissions for very similar symptoms. She  has been doing fairly well over the past several months and remaining sober and on her medications. Diagnosis is complicated by long history of drug use. She feels her medications are very helpful when she takes them (Zoloft and Seroquel). No clear history of manic or psychosis when not using drugs and does very well when sober so diagnosis is not likely bipolar disorder or psychotic disorder.  Her main goals are to get back on her medications and get counseling.   Plan:  MDD with mixed features - continue Seroquel 300 mg qhs - continue Zoloft to 100 mg daily  Cocaine use disorder - pt does not want residential treatment.  - encourage SAIOP if possible.   Anemia - continue Ferrous sulfate 325 mg TID  Dispo -She will discharge back to her home when stable. Planned discharge this coming Monday, but pt is have doubts about her readiness by then. She is encouraged to discuss with Dr. Wonda Olds on Monday.  She will follow up with  [facility] Demri Poulton, MD 08/28/2018, 1:31 PM

## 2018-08-28 NOTE — BHH Group Notes (Signed)
LCSW Group Therapy Note 08/28/2018 1:15pm  Type of Therapy and Topic: Group Therapy: Feelings Around Returning Home & Establishing a Supportive Framework and Supporting Oneself When Supports Not Available  Participation Level: Did Not Attend  Description of Group:  Patients first processed thoughts and feelings about upcoming discharge. These included fears of upcoming changes, lack of change, new living environments, judgements and expectations from others and overall stigma of mental health issues. The group then discussed the definition of a supportive framework, what that looks and feels like, and how do to discern it from an unhealthy non-supportive network. The group identified different types of supports as well as what to do when your family/friends are less than helpful or unavailable  Therapeutic Goals  1. Patient will identify one healthy supportive network that they can use at discharge. 2. Patient will identify one factor of a supportive framework and how to tell it from an unhealthy network. 3. Patient able to identify one coping skill to use when they do not have positive supports from others. 4. Patient will demonstrate ability to communicate their needs through discussion and/or role plays.  Summary of Patient Progress:  Pt was invited to attend group but chose not to attend. CSW will continue to encourage pt to attend group throughout their admission.   Therapeutic Modalities Cognitive Behavioral Therapy Motivational Interviewing   [place], LCSW 08/28/2018 2:38 PM

## 2018-08-29 MED ORDER — GABAPENTIN 600 MG PO TABS: 600.0000 mg | ORAL_TABLET | Freq: Three times a day (TID) | ORAL | 0 refills | Status: DC

## 2018-08-29 MED ORDER — QUETIAPINE FUMARATE 300 MG PO TABS: 300.0000 mg | ORAL_TABLET | Freq: Every day | ORAL | 0 refills | Status: DC

## 2018-08-29 MED ORDER — FERROUS SULFATE 325 (65 FE) MG PO TABS: 325.0000 mg | ORAL_TABLET | Freq: Three times a day (TID) | ORAL | 0 refills | Status: DC

## 2018-08-29 MED ORDER — QUETIAPINE FUMARATE 25 MG PO TABS
25.0000 mg | ORAL_TABLET | Freq: Every day | ORAL | Status: DC | PRN
  Administered 2018-08-30: 25 mg via ORAL
  Filled 2018-08-29: qty 1

## 2018-08-29 MED ORDER — SERTRALINE HCL 100 MG PO TABS: 100.0000 mg | ORAL_TABLET | Freq: Every day | ORAL | 0 refills | Status: DC

## 2018-08-29 MED ORDER — HYDROXYZINE HCL 50 MG PO TABS: 50.0000 mg | ORAL_TABLET | Freq: Three times a day (TID) | ORAL | 0 refills | Status: DC | PRN

## 2018-08-29 MED ORDER — TRAZODONE HCL 50 MG PO TABS: 50.0000 mg | ORAL_TABLET | Freq: Every evening | ORAL | 0 refills | Status: DC | PRN

## 2018-08-29 NOTE — Progress Notes (Signed)
[facility] MD Progress Note  08/29/2018 2:54 PM Deborah Melendez  MRN:  [id number] Subjective:  Pt states that she is feeling better today. She states that her mood feels more stable but "around 3 o'clock I get really anxious and agitated." She would like medication to help her with this around this time. She states that she wants to try to get a part time job because when she is at home alone this is when she gets anxious and starts to use crack cocaine. Her boyfriend works a lot and her daughter goes to school so she does not have much of a routine at home. Her boyfriends ICE hearing is on Thursday and she is anxious about this. She denies SI or any thoughts of self harm. She is sleeping better. She does not appear manic.  Principal Problem: Major depressive disorder, recurrent episode with mixed features ([place]) Diagnosis:   Patient Active Problem List   Diagnosis Date Noted  . Major depressive disorder, recurrent episode with mixed features ([place]) [F33.9] 01/27/2018    Priority: High  . Cocaine use disorder, severe, dependence ([place]) [F14.20] 08/08/2015    Priority: High  . Tobacco use disorder [F17.200] 05/22/2017  . Suicidal ideation [R45.851]   . Cannabis use disorder, severe, dependence ([place]) [F12.20] 01/20/2017   Total Time spent with patient: 20 minutes  Past Psychiatric History: See H&P  Past Medical History:  Past Medical History:  Diagnosis Date  . Bipolar 1 disorder, depressed, severe ([place]) 02/04/2017  . Cocaine abuse with cocaine-induced mood disorder ([place]) 07/27/2017  . Depression   . Homicidal ideations   . Manic behavior ([place])   . MDD (major depressive disorder), recurrent severe, without psychosis ([place]) 08/08/2015  . MRSA (methicillin resistant Staphylococcus aureus)    surgery on finger 3 years ago  . Substance or medication-induced bipolar and related disorder with onset during intoxication ([place]) 09/08/2016  . Suicidal ideation   . UTI (urinary tract infection)  10/08/2017    Past Surgical History:  Procedure Laterality Date  . FINGER SURGERY     Family History:  Family History  Problem Relation Age of Onset  . Diabetes Mother   . Hypertension Mother   . Drug abuse Father   . Schizophrenia Maternal Aunt    Family Psychiatric  History: See H&P Social History:  Social History   Substance and Sexual Activity  Alcohol Use Yes  . Alcohol/week: 10.0 standard drinks  . Types: 10 Shots of liquor per week   Comment: once a week drinks a bottle of vodka     Social History   Substance and Sexual Activity  Drug Use Yes  . Frequency: 1.0 times per week  . Types: Cocaine, Marijuana   Comment: last used 3-4 days ago.    Social History   Socioeconomic History  . Marital status: Single    Spouse name: Not on file  . Number of children: Not on file  . Years of education: Not on file  . Highest education level: Not on file  Occupational History  . Not on file  Social Needs  . Financial resource strain: Not on file  . Food insecurity:    Worry: Not on file    Inability: Not on file  . Transportation needs:    Medical: Not on file    Non-medical: Not on file  Tobacco Use  . Smoking status: Current Every Day Smoker    Packs/day: 1.00    Types: Cigarettes  . Smokeless tobacco: Never Used  .  Tobacco comment: pt reported quiting three weeks ago  Substance and Sexual Activity  . Alcohol use: Yes    Alcohol/week: 10.0 standard drinks    Types: 10 Shots of liquor per week    Comment: once a week drinks a bottle of vodka  . Drug use: Yes    Frequency: 1.0 times per week    Types: Cocaine, Marijuana    Comment: last used 3-4 days ago.  Marland Kitchen Sexual activity: Yes    Birth control/protection: None  Lifestyle  . Physical activity:    Days per week: Not on file    Minutes per session: Not on file  . Stress: Not on file  Relationships  . Social connections:    Talks on phone: Not on file    Gets together: Not on file    Attends religious  service: Not on file    Active member of club or organization: Not on file    Attends meetings of clubs or organizations: Not on file    Relationship status: Not on file  Other Topics Concern  . Not on file  Social History Narrative  . Not on file   Additional Social History:                         Sleep: Good  Appetite:  Good  Current Medications: Current Facility-Administered Medications  Medication Dose Route Frequency Provider Last Rate Last Dose  . acetaminophen (TYLENOL) tablet 650 mg  650 mg Oral Q6H PRN Clapacs, [name], MD   650 mg at 08/28/18 1523  . alum & mag hydroxide-simeth (MAALOX/MYLANTA) 200-200-20 MG/5ML suspension 30 mL  30 mL Oral Q4H PRN Clapacs, John T, MD      . ferrous sulfate tablet 325 mg  325 mg Oral TID WC Zaelyn Barbary, Tyson Babinski, MD   325 mg at 08/29/18 1124  . gabapentin (NEURONTIN) capsule 600 mg  600 mg Oral TID Clapacs, John T, MD   600 mg at 08/29/18 1124  . hydrOXYzine (ATARAX/VISTARIL) tablet 50 mg  50 mg Oral TID PRN Clapacs, [name], MD   50 mg at 08/28/18 2158  . magnesium hydroxide (MILK OF MAGNESIA) suspension 30 mL  30 mL Oral Daily PRN Clapacs, John T, MD      . QUEtiapine (SEROQUEL) tablet 300 mg  300 mg Oral QHS Clapacs, John T, MD   300 mg at 08/28/18 2158  . sertraline (ZOLOFT) tablet 100 mg  100 mg Oral Daily He, Jun, MD   100 mg at 08/29/18 0837  . traZODone (DESYREL) tablet 100 mg  100 mg Oral QHS PRN Clapacs, [name], MD   100 mg at 08/28/18 2158    Lab Results: No results found for this or any previous visit (from the past 48 hour(s)).  Blood Alcohol level:  Lab Results  Component Value Date   ETH <10 08/25/2018   ETH <10 81/85/6314    Metabolic Disorder Labs: Lab Results  Component Value Date   HGBA1C 5.3 08/25/2018   MPG 105.41 08/25/2018   MPG 114.02 10/09/2017   No results found for: PROLACTIN Lab Results  Component Value Date   CHOL 148 08/25/2018   TRIG 63 08/25/2018   HDL 52 08/25/2018   CHOLHDL 2.8  08/25/2018   VLDL 13 08/25/2018   LDLCALC 83 08/25/2018   LDLCALC 69 10/09/2017    Physical Findings: AIMS:  , ,  ,  ,    CIWA:  CIWA-Ar Total:  4 COWS:  COWS Total Score: 1  Musculoskeletal: Strength & Muscle Tone: within normal limits Gait & Station: normal Patient leans: N/A  Psychiatric Specialty Exam: Physical Exam  Nursing note and vitals reviewed.   Review of Systems  All other systems reviewed and are negative.   Blood pressure 116/76, pulse 84, temperature 98.6 F (37 C), temperature source Oral, resp. rate 16, height 5\' 2"  (1.575 m), weight 67.1 kg, last menstrual period 08/18/2018, SpO2 100 %.Body mass index is 27.07 kg/m.  General Appearance: Casual  Eye Contact:  Good  Speech:  Clear and Coherent  Volume:  Normal  Mood:  Euthymic  Affect:  Appropriate  Thought Process:  Coherent, goal directed  Orientation:  Full (Time, Place, and Person)  Thought Content:  Logical  Suicidal Thoughts:  No  Homicidal Thoughts:  No  Memory:  Immediate;   Fair  Judgement:  Fair  Insight:  Fair  Psychomotor Activity:  Normal  Concentration:  Concentration: Fair  Recall:  [employer] of Knowledge:  Fair  Language:  Fair  Akathisia:  No      Assets:  Resilience  ADL's:  Intact  Cognition:  Fair  Sleep:  Number of Hours: 6     Treatment Plan Summary: 42 yo female admitted due to depression and SI in the context of medication noncompliance and crack cocaine use. Mood is more stable and SI is resolving.   Plan:  MDD with mixed features -Continue Seroquel 300 mg qhs -Continue Zoloft 50 mg daily  Cocaine use disorder _She is not interested in treatment  Anemia -Continue Ferrous Sulfate 325 mg TID  Dispo _Discharge home tomorrow and follow up with Loraine Maple, MD 08/29/2018, 2:54 PM

## 2018-08-29 NOTE — Plan of Care (Signed)
.   Emotional and mental status improving . Encourage patient  attendance to unit programing . Working on Scientific laboratory technician . Encourage  appropriate ADL's , handwashing . Voice  to writer  no safety concerns Patient able to verbalize understanding of information received  information given in concrete form .   Problem: Education: Goal: Knowledge of [place] [facility] Education information/materials will improve Outcome: Progressing Goal: Emotional status will improve Outcome: Progressing Goal: Mental status will improve Outcome: Progressing Goal: Verbalization of understanding the information provided will improve Outcome: Progressing   Problem: Education: Goal: Utilization of techniques to improve thought processes will improve Outcome: Progressing   Problem: Activity: Goal: Interest or engagement in leisure activities will improve Outcome: Progressing   Problem: Coping: Goal: Coping ability will improve Outcome: Progressing Goal: Will verbalize feelings Outcome: Progressing   Problem: Education: Goal: Ability to state activities that reduce stress will improve Outcome: Progressing   Problem: Coping: Goal: Ability to identify and develop effective coping behavior will improve Outcome: Progressing   Problem: Education: Goal: Ability to incorporate positive changes in behavior to improve self-esteem will improve Outcome: Progressing   Problem: Health Behavior/Discharge Planning: Goal: Ability to remain free from injury will improve Outcome: Progressing   Problem: Skin Integrity: Goal: Demonstration of wound healing without infection will improve Outcome: Progressing

## 2018-08-29 NOTE — Plan of Care (Signed)
  Problem: Coping: Goal: Ability to identify and develop effective coping behavior will improve Outcome: Progressing   Problem: Education: Goal: Ability to incorporate positive changes in behavior to improve self-esteem will improve Outcome: Progressing

## 2018-08-29 NOTE — Progress Notes (Signed)
Patient alert and oriented x 4, denies SI/HI/AVH, affect is flat mood is blunted, she is less irritable,not argumentative, she's in a good spirit no loud outburst or bizarre behavior .Patient  participated in her  treatment plan, aappers less anxious, interacting appropriately with peers and staff. Patient was given support and encouraged to go to evening wrap up group. Patient did attend evening wrap up group, interacted appropriately with peers and staff , complaint with medication, receptive to treatment on unit, willl continue to closely monitor

## 2018-08-29 NOTE — Progress Notes (Signed)
Recreation Therapy Notes  INPATIENT RECREATION TR PLAN  Patient Details Name: Deborah Melendez MRN: [id number] DOB: 04-04-1976 Today's Date: 08/29/2018  Rec Therapy Plan Is patient appropriate for Therapeutic Recreation?: Yes Treatment times per week: at least 3 Estimated Length of Stay: 5-7 days TR Treatment/Interventions: Group participation (Comment)  Discharge Criteria Pt will be discharged from therapy if:: Discharged Treatment plan/goals/alternatives discussed and agreed upon by:: Patient/family  Discharge Summary Short term goals set: Patient will engage in interactions with peers and staff in pro-social manner at least 2x within 5 recreation therapy group sessions Short term goals met: Not met Progress toward goals comments: Groups attended Reason goals not met: Patient did not attend any groups Therapeutic equipment acquired: N/A Reason patient discharged from therapy: Discharge from hospital Pt/family agrees with progress & goals achieved: Yes Date patient discharged from therapy: 08/29/18   Charlei Ramsaran 08/29/2018, 11:50 AM

## 2018-08-29 NOTE — Progress Notes (Signed)
D: Patient stated slept good last night .Stated appetite is good and energy level  Is normal. Stated concentration is good . Stated on Depression scale 0 , hopeless 0 and anxiety 0 .( low 0-10 high) Denies suicidal  homicidal ideations .  No auditory hallucinations  No pain concerns . Appropriate ADL'S. Interacting with peers and staff. . Emotional and mental status improving . Encourage patient attendance to unit programing . Working on Radiographer, therapeutic . Encourage appropriate ADL's , handwashing . Voice to writer no safety concerns Patient able to verbalize understanding of information received  information given in concrete form . Patient stated she will be leaving on Wednesday A: Encourage patient participation with unit programming . Instruction  Given on  Medication , verbalize understanding. R: Voice no other concerns. Staff continue to monitor

## 2018-08-29 NOTE — Plan of Care (Signed)
  Problem: Education: Goal: Utilization of techniques to improve thought processes will improve Outcome: Progressing   Problem: Coping: Goal: Ability to identify and develop effective coping behavior will improve Outcome: Progressing  Patient is improving regarding her thought process on the unit.

## 2018-08-30 MED ORDER — LOPERAMIDE HCL 2 MG PO CAPS
4.0000 mg | ORAL_CAPSULE | Freq: Once | ORAL | Status: AC
  Administered 2018-08-30: 4 mg via ORAL
  Filled 2018-08-30: qty 2

## 2018-08-30 NOTE — BHH Group Notes (Signed)
[place] Group Notes:  (Nursing/MHT/Case Management/Adjunct)  Date:  08/30/2018  Time:  12:56 AM  Type of Therapy:  Group Therapy  Participation Level:  Active  Participation Quality:  Appropriate  Affect:  Appropriate  Cognitive:  Appropriate  Insight:  Appropriate  Engagement in Group:  Engaged  Modes of Intervention:  Discussion  Summary of Progress/Problems:  Kandis Fantasia 08/30/2018, 12:56 AM

## 2018-08-30 NOTE — Progress Notes (Signed)
Recreation Therapy Notes  INPATIENT RECREATION TR PLAN  Patient Details Name: Deborah Melendez MRN: [id number] DOB: 1976/01/05 Today's Date: 08/30/2018  Rec Therapy Plan Is patient appropriate for Therapeutic Recreation?: Yes Treatment times per week: at least 3 Estimated Length of Stay: 5-7 days TR Treatment/Interventions: Group participation (Comment)  Discharge Criteria Pt will be discharged from therapy if:: Discharged Treatment plan/goals/alternatives discussed and agreed upon by:: Patient/family  Discharge Summary Short term goals set: Patient will engage in interactions with peers and staff in pro-social manner at least 2x within 5 recreation therapy group sessions Short term goals met: Not met Progress toward goals comments: Groups attended(Problem Solving) Reason goals not met: Patient spent most of her time in her room Therapeutic equipment acquired: N/A Reason patient discharged from therapy: Discharge from hospital Pt/family agrees with progress & goals achieved: Yes Date patient discharged from therapy: 08/30/18   Emmajean Ratledge 08/30/2018, 11:04 AM

## 2018-08-30 NOTE — Progress Notes (Signed)
Patient is discharged to self at 2025, patient received her belongings , 7 days supply  Of medications, prescriptions , and discharge documents .

## 2018-08-30 NOTE — Progress Notes (Signed)
  [facility] Adult Case Management Discharge Plan :  Will you be returning to the same living situation after discharge:  Yes,  With boyfriend At discharge, do you have transportation home?: Yes,  Boyfriend will come at discharge Do you have the ability to pay for your medications: Yes,  Referred to a provider who can assist  Release of information consent forms completed and in the chart;  Patient's signature needed at discharge.  Patient to Follow up at: Follow-up Information    [facility] on 08/31/2018.   Why:  Please follow up on [facility] on Wednesday August 31, 2018 at 8am. Please bring your ID and all medications that you are taking. Thank you. Contact information: [address] [place] [address] [phone]           Next level of care provider has access to [place] and Suicide Prevention discussed: Yes,  Completed with patient and close friend  Have you used any form of tobacco in the last 30 days? (Cigarettes, Smokeless Tobacco, Cigars, and/or Pipes): Patient Refused Screening  Has patient been referred to the Quitline?: Patient refused referral  Patient has been referred for addiction treatment: Yes  Darin Engels, [place] 08/30/2018, 8:26 AM

## 2018-08-30 NOTE — Progress Notes (Signed)
Recreation Therapy Notes  Date: 08/30/2018  Time: 9:30 am  Location: Craft Room  Behavioral response: Appropriate    Intervention Topic: Problem Solving  Discussion/Intervention:  Group content on today was focused on problem solving. The group described what problem solving is. Patients expressed how problems affect them and how they deal with problems. Individuals identified healthy ways to deal with problems. Patients explained what normally happens to them when they do not deal with problems. The group expressed reoccurring problems for them. The group participated in the intervention "Put the story together" with their peers and worked together to put a story that was broken up in the correct order. Clinical Observations/Feedback:  Patient came to group late due to unknown reasons. She participated in the intervention and was social with peers and staff during group.  Nusaiba Guallpa LRT/CTRS         Kaiah Hosea 08/30/2018 11:02 AM

## 2018-08-30 NOTE — Plan of Care (Signed)
Patient is calm and responding well to treatment , participating in groups and assertive with peers, patient is appropriate and demonstrate self control , mood is bright and compliant with her medications , maintaining safety of self, nurse provides positive reinforcement to increase self esteem., sleep long hours no distress, denies SI/Hiand no signs of AVH 15 minute rounding is in progress.   Problem: Education: Goal: Knowledge of [place] [facility] Education information/materials will improve Outcome: Progressing Goal: Emotional status will improve Outcome: Progressing Goal: Mental status will improve Outcome: Progressing Goal: Verbalization of understanding the information provided will improve Outcome: Progressing   Problem: Education: Goal: Utilization of techniques to improve thought processes will improve Outcome: Progressing   Problem: Activity: Goal: Interest or engagement in leisure activities will improve Outcome: Progressing   Problem: Coping: Goal: Coping ability will improve Outcome: Progressing Goal: Will verbalize feelings Outcome: Progressing   Problem: Education: Goal: Ability to state activities that reduce stress will improve Outcome: Progressing   Problem: Coping: Goal: Ability to identify and develop effective coping behavior will improve Outcome: Progressing   Problem: Education: Goal: Ability to incorporate positive changes in behavior to improve self-esteem will improve Outcome: Progressing   Problem: Health Behavior/Discharge Planning: Goal: Ability to remain free from injury will improve Outcome: Progressing   Problem: Skin Integrity: Goal: Demonstration of wound healing without infection will improve Outcome: Progressing   Problem: Education: Goal: Knowledge of General Education information will improve Description Including pain rating scale, medication(s)/side effects and non-pharmacologic comfort measures Outcome: Progressing    Problem: Health Behavior/Discharge Planning: Goal: Ability to manage health-related needs will improve Outcome: Progressing   Problem: Clinical Measurements: Goal: Ability to maintain clinical measurements within normal limits will improve Outcome: Progressing Goal: Will remain free from infection Outcome: Progressing Goal: Diagnostic test results will improve Outcome: Progressing Goal: Respiratory complications will improve Outcome: Progressing Goal: Cardiovascular complication will be avoided Outcome: Progressing   Problem: Activity: Goal: Risk for activity intolerance will decrease Outcome: Progressing   Problem: Nutrition: Goal: Adequate nutrition will be maintained Outcome: Progressing   Problem: Coping: Goal: Level of anxiety will decrease Outcome: Progressing   Problem: Elimination: Goal: Will not experience complications related to bowel motility Outcome: Progressing Goal: Will not experience complications related to urinary retention Outcome: Progressing   Problem: Pain Managment: Goal: General experience of comfort will improve Outcome: Progressing   Problem: Safety: Goal: Ability to remain free from injury will improve Outcome: Progressing   Problem: Skin Integrity: Goal: Risk for impaired skin integrity will decrease Outcome: Progressing

## 2018-08-30 NOTE — BHH Suicide Risk Assessment (Signed)
[facility] Discharge Suicide Risk Assessment   Principal Problem: Major depressive disorder, recurrent episode with mixed features [facility]) Discharge Diagnoses:  Patient Active Problem List   Diagnosis Date Noted  . Major depressive disorder, recurrent episode with mixed features ([place]) [F33.9] 01/27/2018    Priority: High  . Cocaine use disorder, severe, dependence ([place]) [F14.20] 08/08/2015    Priority: High  . Tobacco use disorder [F17.200] 05/22/2017  . Suicidal ideation [R45.851]   . Cannabis use disorder, severe, dependence ([place]) [F12.20] 01/20/2017    Mental Status Per Nursing Assessment::   On Admission:  NA  Demographic Factors:  Low socioeconomic status and Unemployed  Loss Factors: NA  Historical Factors: Impulsivity  Risk Reduction Factors:   Living with another person, especially a relative and Positive coping skills or problem solving skills  Continued Clinical Symptoms:  Alcohol/Substance Abuse/Dependencies  Cognitive Features That Contribute To Risk:  None    Suicide Risk:  Minimal: No identifiable suicidal ideation.  Patients presenting with no risk factors but with morbid ruminations; may be classified as minimal risk based on the severity of the depressive symptoms  Follow-up Information    [facility] on 08/31/2018.   Why:  Please follow up on [facility] on Wednesday August 31, 2018 at 8am. Please bring your ID and all medications that you are taking. Thank you. Contact information: [place] [address] [phone]           Marylin Crosby, MD 08/30/2018, 9:10 AM

## 2018-08-30 NOTE — BHH Group Notes (Signed)
08/30/2018 1PM  Type of Therapy/Topic:  Group Therapy:  Feelings about Diagnosis  Participation Level:  Did Not Attend   Description of Group:   This group will allow patients to explore their thoughts and feelings about diagnoses they have received. Patients will be guided to explore their level of understanding and acceptance of these diagnoses. Facilitator will encourage patients to process their thoughts and feelings about the reactions of others to their diagnosis and will guide patients in identifying ways to discuss their diagnosis with significant others in their lives. This group will be process-oriented, with patients participating in exploration of their own experiences, giving and receiving support, and processing challenge from other group members.   Therapeutic Goals: 1. Patient will demonstrate understanding of diagnosis as evidenced by identifying two or more symptoms of the disorder 2. Patient will be able to express two feelings regarding the diagnosis 3. Patient will demonstrate their ability to communicate their needs through discussion and/or role play  Summary of Patient Progress: Patient was encouraged and invited to attend group. Patient did not attend group. Social worker will continue to encourage group participation in the future.        Therapeutic Modalities:   Cognitive Behavioral Therapy Brief Therapy Feelings Identification    Darin Engels, LCSW 08/30/2018 1:56 PM

## 2018-08-30 NOTE — Discharge Summary (Signed)
Physician Discharge Summary Note  Patient:  Deborah Melendez is an 42 y.o., female MRN:  [id number] DOB:  [birth year] Patient phone:  [phone] (home)  Patient address:   [place] [address],  Total Time spent with patient: 20 minutes  Plus 20 minutes of medication reconciliation, discharge planning, and discharge documentation   Date of Admission:  08/25/2018 Date of Discharge: 08/30/18  Reason for Admission:  42 yo female admitted due to worsening depression and SI. Pt is known to this provider from previous admissions where she presents for very similary symptoms. She was last admitted in February of this year. She states that she was doing well after discharge last time. She took her medications and stayed sober. Things were going well with her boyfriend [place]. She ran out of medications about 1 month ago and "just didn't go back to [place]." She states that she relapsed and used crack cocaine 3 times and things spiraled. She states that she was having mood swings and "yelling at [facility] a lot. "She states that Kittie Plater has an ICE hearing on Tuesday in [place] and this is making her nervous that he may be deported. Her daughter also started pre-school on Monday and this made her really emotional. She states, "I was yelling at [facility] and I just said I needed to come to get help and get back on my medications." She states that she was feeling overwhelmed and started feeling suicidal. She was quite agitated in the ED on arrival. She states that she is feeling slightly better already today. She had a good night sleep. She feels her medications are working well and wants to continue the same one that she was on in the past. She is feeling more hopeful today. She states that she wants to discharge Monday so she can attend [place]'s court hearing on Tuesday.   Principal Problem: Major depressive disorder, recurrent episode with mixed features [facility]) Discharge  Diagnoses: Patient Active Problem List   Diagnosis Date Noted  . Major depressive disorder, recurrent episode with mixed features ([place]) [F33.9] 01/27/2018    Priority: High  . Cocaine use disorder, severe, dependence ([place]) [F14.20] 08/08/2015    Priority: High  . Tobacco use disorder [F17.200] 05/22/2017  . Suicidal ideation [R45.851]   . Cannabis use disorder, severe, dependence ([place]) [F12.20] 01/20/2017    Past Psychiatric History: See H&P  Past Medical History:  Past Medical History:  Diagnosis Date  . Bipolar 1 disorder, depressed, severe ([place]) 02/04/2017  . Cocaine abuse with cocaine-induced mood disorder ([place]) 07/27/2017  . Depression   . Homicidal ideations   . Manic behavior ([place])   . MDD (major depressive disorder), recurrent severe, without psychosis ([place]) 08/08/2015  . MRSA (methicillin resistant Staphylococcus aureus)    surgery on finger 3 years ago  . Substance or medication-induced bipolar and related disorder with onset during intoxication ([place]) 09/08/2016  . Suicidal ideation   . UTI (urinary tract infection) 10/08/2017    Past Surgical History:  Procedure Laterality Date  . FINGER SURGERY     Family History:  Family History  Problem Relation Age of Onset  . Diabetes Mother   . Hypertension Mother   . Drug abuse Father   . Schizophrenia Maternal Aunt    Family Psychiatric  History:See H&P Social History:  Social History   Substance and Sexual Activity  Alcohol Use Yes  . Alcohol/week: 10.0 standard drinks  . Types: 10 Shots of liquor per week   Comment:  once a week drinks a bottle of vodka     Social History   Substance and Sexual Activity  Drug Use Yes  . Frequency: 1.0 times per week  . Types: Cocaine, Marijuana   Comment: last used 3-4 days ago.    Social History   Socioeconomic History  . Marital status: Single    Spouse name: Not on file  . Number of children: Not on file  . Years of education: Not on file  . Highest education level:  Not on file  Occupational History  . Not on file  Social Needs  . Financial resource strain: Not on file  . Food insecurity:    Worry: Not on file    Inability: Not on file  . Transportation needs:    Medical: Not on file    Non-medical: Not on file  Tobacco Use  . Smoking status: Current Every Day Smoker    Packs/day: 1.00    Types: Cigarettes  . Smokeless tobacco: Never Used  . Tobacco comment: pt reported quiting three weeks ago  Substance and Sexual Activity  . Alcohol use: Yes    Alcohol/week: 10.0 standard drinks    Types: 10 Shots of liquor per week    Comment: once a week drinks a bottle of vodka  . Drug use: Yes    Frequency: 1.0 times per week    Types: Cocaine, Marijuana    Comment: last used 3-4 days ago.  [place] Sexual activity: Yes    Birth control/protection: None  Lifestyle  . Physical activity:    Days per week: Not on file    Minutes per session: Not on file  . Stress: Not on file  Relationships  . Social connections:    Talks on phone: Not on file    Gets together: Not on file    Attends religious service: Not on file    Active member of club or organization: Not on file    Attends meetings of clubs or organizations: Not on file    Relationship status: Not on file  Other Topics Concern  . Not on file  Social History Narrative  . Not on file    Hospital Course:  Pt was restarted on home medications including Zoloft and Seroquel. On day of discharge, she states that she feels much better. Her mood is much more stable. She is sleeping well. She states that she wants to get a job so she is not home alone all day which causes her to use drugs. She denies SI or any thoughts of self harm. Denies HI, AH, VH. She has bright affect. He is organized and goal directed. Does not appear manic or psychotic.   The patient is at low risk of imminent suicide. Patient denied thoughts, intent, or plan for harm to self or others, expressed significant future orientation, and  expressed an ability to mobilize assistance for her needs. She is presently void of any contributing psychiatric symptoms, cognitive difficulties, or substance use which would elevate her risk for lethality. Chronic risk for lethality is elevated in light of substance abuse. The chronic risk is presently mitigated by her ongoing desire and engagement in [facility] treatment and mobilization of support from family and friends. Chronic risk may elevate if she experiences any significant loss or worsening of symptoms, which can be managed and monitored through outpatient providers. At this time,a cute risk for lethality is low and she is stable for ongoing outpatient management.   Modifiable risk factors  were addressed during this hospitalization through appropriate pharmacotherapy and establishment of outpatient follow-up treatment. Some risk factors for suicide are situational (i.e. Unstable housing) or related personality pathology (i.e. Poor coping mechanisms) and thus cannot be further mitigated by continued hospitalization in this setting.    Physical Findings: AIMS:  , ,  ,  ,    CIWA:  CIWA-Ar Total: 4 COWS:  COWS Total Score: 1  Musculoskeletal: Strength & Muscle Tone: within normal limits Gait & Station: normal Patient leans: N/A  Psychiatric Specialty Exam: Physical Exam  Nursing note and vitals reviewed.   Review of Systems  All other systems reviewed and are negative.   Blood pressure (!) 121/100, pulse 94, temperature 98.3 F (36.8 C), temperature source Oral, resp. rate 16, height 5\' 2"  (1.575 m), weight 67.1 kg, last menstrual period 08/18/2018, SpO2 100 %.Body mass index is 27.07 kg/m.  General Appearance: Casual  Eye Contact:  Good  Speech:  Clear and Coherent  Volume:  Normal  Mood:  Euthymic  Affect:  Appropriate  Thought Process:  Coherent and Goal Directed  Orientation:  Full (Time, Place, and Person)  Thought Content:  Logical  Suicidal Thoughts:  No  Homicidal  Thoughts:  No  Memory:  Immediate;   Fair  Judgement:  Impaired  Insight:  Fair  Psychomotor Activity:  Normal  Concentration:  Concentration: Fair  Recall:  [place] of Knowledge:  Fair  Language:  Fair  Akathisia:  No      Assets:  Resilience  ADL's:  Intact  Cognition:  WNL  Sleep:  Number of Hours: 7     Have you used any form of tobacco in the last 30 days? (Cigarettes, Smokeless Tobacco, Cigars, and/or Pipes): Patient Refused Screening  Has this patient used any form of tobacco in the last 30 days? (Cigarettes, Smokeless Tobacco, Cigars, and/or Pipes) Yes, Yes, A prescription for an FDA-approved tobacco cessation medication was offered at discharge and the patient refused  Blood Alcohol level:  Lab Results  Component Value Date   [facility] <10 08/25/2018   ETH <10 40/98/1191    Metabolic Disorder Labs:  Lab Results  Component Value Date   HGBA1C 5.3 08/25/2018   MPG 105.41 08/25/2018   MPG 114.02 10/09/2017   No results found for: PROLACTIN Lab Results  Component Value Date   CHOL 148 08/25/2018   TRIG 63 08/25/2018   HDL 52 08/25/2018   CHOLHDL 2.8 08/25/2018   VLDL 13 08/25/2018   LDLCALC 83 08/25/2018   LDLCALC 69 10/09/2017    See Psychiatric Specialty Exam and Suicide Risk Assessment completed by Attending Physician prior to discharge.  Discharge destination:  Home  Is patient on multiple antipsychotic therapies at discharge:  No   Has Patient had three or more failed trials of antipsychotic monotherapy by history:  No  Recommended Plan for Multiple Antipsychotic Therapies: NA  Discharge Instructions    Increase activity slowly   Complete by:  As directed      Allergies as of 08/30/2018   No Known Allergies     Medication List    STOP taking these medications   azithromycin 250 MG tablet Commonly known as:  ZITHROMAX   predniSONE 20 MG tablet Commonly known as:  DELTASONE     TAKE these medications     Indication  ferrous sulfate 325  (65 FE) MG tablet Take 1 tablet (325 mg total) by mouth 3 (three) times daily with meals.  Indication:  Anemia From  Inadequate Iron in the Body   gabapentin 600 MG tablet Commonly known as:  NEURONTIN Take 1 tablet (600 mg total) by mouth 3 (three) times daily.  Indication:  Neuropathic Pain   hydrOXYzine 50 MG tablet Commonly known as:  ATARAX/VISTARIL Take 1 tablet (50 mg total) by mouth 3 (three) times daily as needed for anxiety.  Indication:  Feeling Anxious   QUEtiapine 300 MG tablet Commonly known as:  SEROQUEL Take 1 tablet (300 mg total) by mouth at bedtime.  Indication:  MDD   sertraline 100 MG tablet Commonly known as:  ZOLOFT Take 1 tablet (100 mg total) by mouth daily. What changed:    medication strength  how much to take  Indication:  Major Depressive Disorder   traZODone 50 MG tablet Commonly known as:  DESYREL Take 1 tablet (50 mg total) by mouth at bedtime as needed for sleep.  Indication:  Trouble Sleeping      Follow-up [facility] on 08/31/2018.   Why:  Please follow up on [facility] on Wednesday August 31, 2018 at 8am. Please bring your ID and all medications that you are taking. Thank you. Contact information: [address] [place] [address] [phone]            Signed: Marylin Crosby, MD 08/30/2018, 9:11 AM

## 2018-10-03 ENCOUNTER — Emergency Department (HOSPITAL_COMMUNITY): Payer: Self-pay

## 2018-10-03 ENCOUNTER — Encounter (HOSPITAL_COMMUNITY): Payer: Self-pay | Admitting: *Deleted

## 2018-10-03 ENCOUNTER — Emergency Department (HOSPITAL_COMMUNITY)
Admission: EM | Admit: 2018-10-03 | Discharge: 2018-10-03 | Disposition: A | Discharge status: Home / Self Care | Payer: Self-pay | Attending: Emergency Medicine | Admitting: Emergency Medicine

## 2018-10-03 ENCOUNTER — Other Ambulatory Visit: Payer: Self-pay

## 2018-10-03 DIAGNOSIS — F17210 Nicotine dependence, cigarettes, uncomplicated: Secondary | ICD-10-CM | POA: Insufficient documentation

## 2018-10-03 DIAGNOSIS — J40 Bronchitis, not specified as acute or chronic: Secondary | ICD-10-CM | POA: Insufficient documentation

## 2018-10-03 DIAGNOSIS — Z79899 Other long term (current) drug therapy: Secondary | ICD-10-CM | POA: Insufficient documentation

## 2018-10-03 LAB — COMPREHENSIVE METABOLIC PANEL
ALBUMIN: 3.4 g/dL — AB (ref 3.5–5.0)
ALT: 14 U/L (ref 0–44)
ANION GAP: 7 (ref 5–15)
AST: 19 U/L (ref 15–41)
Alkaline Phosphatase: 58 U/L (ref 38–126)
BILIRUBIN TOTAL: 0.3 mg/dL (ref 0.3–1.2)
BUN: 13 mg/dL (ref 6–20)
CO2: 21 mmol/L — AB (ref 22–32)
Calcium: 8.6 mg/dL — ABNORMAL LOW (ref 8.9–10.3)
Chloride: 108 mmol/L (ref 98–111)
Creatinine, Ser: 0.88 mg/dL (ref 0.44–1.00)
GFR calc Af Amer: >60 mL/min (ref >60)
GFR calc non Af Amer: >60 mL/min (ref >60)
GLUCOSE: 102 mg/dL — AB (ref 70–99)
POTASSIUM: 3.9 mmol/L (ref 3.5–5.1)
SODIUM: 136 mmol/L (ref 135–145)
TOTAL PROTEIN: 6.7 g/dL (ref 6.5–8.1)

## 2018-10-03 LAB — CBC WITH DIFFERENTIAL/PLATELET
Abs Immature Granulocytes: 0 10*3/uL (ref 0.0–0.1)
BASOS ABS: 0.1 10*3/uL (ref 0.0–0.1)
BASOS PCT: 1 %
EOS ABS: 0.7 10*3/uL (ref 0.0–0.7)
Eosinophils Relative: 12 %
HCT: 32.6 % — ABNORMAL LOW (ref 36.0–46.0)
Hemoglobin: 10.4 g/dL — ABNORMAL LOW (ref 12.0–15.0)
Immature Granulocytes: 1 %
Lymphocytes Relative: 31 %
Lymphs Abs: 2 10*3/uL (ref 0.7–4.0)
MCH: 27.4 pg (ref 26.0–34.0)
MCHC: 31.9 g/dL (ref 30.0–36.0)
MCV: 86 fL (ref 78.0–100.0)
Monocytes Absolute: 0.3 10*3/uL (ref 0.1–1.0)
Monocytes Relative: 5 %
Neutro Abs: 3.2 10*3/uL (ref 1.7–7.7)
Neutrophils Relative %: 50 %
Platelets: 205 10*3/uL (ref 150–400)
RBC: 3.79 MIL/uL — AB (ref 3.87–5.11)
RDW: 20.3 % — AB (ref 11.5–15.5)
WBC: 6.3 10*3/uL (ref 4.0–10.5)

## 2018-10-03 MED ORDER — IPRATROPIUM-ALBUTEROL 0.5-2.5 (3) MG/3ML IN SOLN
3.0000 mL | Freq: Once | RESPIRATORY_TRACT | Status: AC
  Administered 2018-10-03: 3 mL via RESPIRATORY_TRACT
  Filled 2018-10-03: qty 3

## 2018-10-03 MED ORDER — LORAZEPAM 2 MG/ML IJ SOLN
1.0000 mg | Freq: Once | INTRAMUSCULAR | Status: AC
  Administered 2018-10-03: 1 mg via INTRAMUSCULAR
  Filled 2018-10-03: qty 1

## 2018-10-03 MED ORDER — GUAIFENESIN-DM 100-10 MG/5ML PO SYRP
10.0000 mL | ORAL_SOLUTION | Freq: Once | ORAL | Status: AC
  Administered 2018-10-03: 10 mL via ORAL
  Filled 2018-10-03: qty 10

## 2018-10-03 MED ORDER — ALBUTEROL SULFATE HFA 108 (90 BASE) MCG/ACT IN AERS
2.0000 | INHALATION_SPRAY | RESPIRATORY_TRACT | Status: DC | PRN
  Administered 2018-10-03: 2 via RESPIRATORY_TRACT
  Filled 2018-10-03: qty 6.7

## 2018-10-03 MED ORDER — DOXYCYCLINE HYCLATE 100 MG PO CAPS: 100.0000 mg | ORAL_CAPSULE | Freq: Two times a day (BID) | ORAL | 0 refills | Status: DC

## 2018-10-03 NOTE — Discharge Instructions (Addendum)
Follow-up with your family doctor if not improving 

## 2018-10-03 NOTE — ED Notes (Signed)
Patient verbalizes understanding of discharge instructions. Opportunity for questioning and answers were provided. Armband removed by staff, pt discharged from ED ambulatory.   

## 2018-10-03 NOTE — ED Triage Notes (Signed)
Pt in c/o SOB with hx of asthma, pt reports being out of her inhaler x 3 wks, pt reports CP with cough, pt tachypneic,  Pt requests to be checked for STDs, A&O x4

## 2018-10-03 NOTE — ED Provider Notes (Signed)
[facility] DEPART[facility] Provider Note   CSN: [id number] Arrival date & time: 10/03/18  0941     History   Chief Complaint Chief Complaint  Patient presents with  . Shortness of Breath    HPI Deborah Melendez is a 42 y.o. female.  Patient complains of cough shortness of breath.  The history is provided by the patient. No language interpreter was used.  Shortness of Breath  This is a new problem. The problem occurs frequently.The current episode started 2 days ago. The problem has not changed since onset.Pertinent negatives include no fever, no headaches, no cough, no chest pain, no abdominal pain and no rash. It is unknown what precipitated the problem. Risk factors include smoking. She has tried nothing for the symptoms. The treatment provided no relief.    Past Medical History:  Diagnosis Date  . Bipolar 1 disorder, depressed, severe ([place]) 02/04/2017  . Cocaine abuse with cocaine-induced mood disorder ([facility]) 07/27/2017  . Depression   . Homicidal ideations   . Manic behavior ([place])   . 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 MRSA (methicillin resistant Staphylococcus aureus)    surgery on finger 3 years ago  . 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 Suicidal ideation   . UTI (urinary tract infection) 10/08/2017    Patient Active Problem List   Diagnosis Date Noted  . Major depressive disorder, recurrent episode with mixed features ([place]) 01/27/2018  . Tobacco use disorder 05/22/2017  . Suicidal ideation   . Cannabis use disorder, severe, dependence ([place]) 01/20/2017  . Cocaine use disorder, severe, dependence ([place]) 08/08/2015    Past Surgical History:  Procedure Laterality Date  . FINGER SURGERY       OB History    Gravida  6   Para  4   Term      Preterm      AB      Living  4     SAB      TAB      Ectopic      Multiple      Live Births  0            Home Medications    Prior to Admission medications   Medication Sig Start Date End Date Taking? Authorizing Provider  doxycycline (VIBRAMYCIN) 100 MG capsule Take 1 capsule (100 mg total) by mouth 2 (two) times daily. One po bid x 7 days 10/03/18   Milton Ferguson, MD  ferrous sulfate 325 (65 FE) MG tablet Take 1 tablet (325 mg total) by mouth 3 (three) times daily with meals. 08/29/18 09/28/18  McNew, Tyson Babinski, MD  gabapentin (NEURONTIN) 600 MG tablet Take 1 tablet (600 mg total) by mouth 3 (three) times daily. 08/29/18 09/28/18  McNew, Tyson Babinski, MD  hydrOXYzine (ATARAX/VISTARIL) 50 MG tablet Take 1 tablet (50 mg total) by mouth 3 (three) times daily as needed for anxiety. 08/29/18   McNew, Tyson Babinski, MD  QUEtiapine (SEROQUEL) 300 MG tablet Take 1 tablet (300 mg total) by mouth at bedtime. 08/29/18   McNew, Tyson Babinski, MD  sertraline (ZOLOFT) 100 MG tablet Take 1 tablet (100 mg total) by mouth daily. 08/30/18   McNew, Tyson Babinski, MD  traZODone (DESYREL) 50 MG tablet Take 1 tablet (50 mg total) by mouth at bedtime as needed for sleep. 08/29/18   McNew, Tyson Babinski, MD    Family History Family  History  Problem Relation Age of Onset  . Diabetes Mother   . Hypertension Mother   . Drug abuse Father   . Schizophrenia Maternal Aunt     Social History Social History   Tobacco Use  . Smoking status: Current Every Day Smoker    Packs/day: 1.00    Types: Cigarettes  . Smokeless tobacco: Never Used  . Tobacco comment: pt reported quiting three weeks ago  Substance Use Topics  . Alcohol use: Yes    Alcohol/week: 10.0 standard drinks    Types: 10 Shots of liquor per week    Comment: once a week drinks a bottle of vodka  . Drug use: Yes    Frequency: 1.0 times per week    Types: Cocaine, Marijuana    Comment: last use 1 mth on 10/03/18     Allergies   Patient has no known allergies.   Review of Systems Review of Systems  Constitutional: Negative  for appetite change, fatigue and fever.  HENT: Negative for congestion, ear discharge and sinus pressure.   Eyes: Negative for discharge.  Respiratory: Positive for shortness of breath. Negative for cough.   Cardiovascular: Negative for chest pain.  Gastrointestinal: Negative for abdominal pain and diarrhea.  Genitourinary: Negative for frequency and hematuria.  Musculoskeletal: Negative for back pain.  Skin: Negative for rash.  Neurological: Negative for seizures and headaches.  Psychiatric/Behavioral: Negative for hallucinations.     Physical Exam Updated Vital Signs BP 107/81   Pulse (!) 108   Temp 98 F (36.7 C) (Oral)   Resp 18   Ht 5\' 2"  (1.575 m)   Wt 79.4 kg   SpO2 96%   BMI 32.01 kg/m   Physical Exam  Constitutional: She is oriented to person, place, and time. She appears well-developed.  HENT:  Head: Normocephalic.  Eyes: Conjunctivae and EOM are normal. No scleral icterus.  Neck: Neck supple. No thyromegaly present.  Cardiovascular: Normal rate and regular rhythm. Exam reveals no gallop and no friction rub.  No murmur heard. Pulmonary/Chest: No stridor. She has wheezes. She has no rales. She exhibits no tenderness.  Abdominal: She exhibits no distension. There is no tenderness. There is no rebound.  Musculoskeletal: Normal range of motion. She exhibits no edema.  Lymphadenopathy:    She has no cervical adenopathy.  Neurological: She is oriented to person, place, and time. She exhibits normal muscle tone. Coordination normal.  Skin: No rash noted. No erythema.  Psychiatric: She has a normal mood and affect. Her behavior is normal.     ED Treatments / Results  Labs (all labs ordered are listed, but only abnormal results are displayed) Labs Reviewed  CBC WITH DIFFERENTIAL/PLATELET - Abnormal; Notable for the following components:      Result Value   RBC 3.79 (*)    Hemoglobin 10.4 (*)    HCT 32.6 (*)    RDW 20.3 (*)    All other components within normal  limits  COMPREHENSIVE METABOLIC PANEL - Abnormal; Notable for the following components:   CO2 21 (*)    Glucose, Bld 102 (*)    Calcium 8.6 (*)    Albumin 3.4 (*)    All other components within normal limits    EKG None  Radiology Dg Chest 2 View  Result Date: 10/03/2018 CLINICAL DATA:  Shortness of breath, URI symptoms for several days. Current smoker. History of asthma. EXAM: CHEST - 2 VIEW COMPARISON:  Chest x-ray of April 19, 2018 FINDINGS: The lungs are  mildly hyperinflated. There is an azygos lobe anatomy on the right. There is no focal infiltrate. There is no pleural effusion. The heart and pulmonary vascularity are normal. The mediastinum is normal in width. The trachea is midline. IMPRESSION: Mild hyperinflation consistent with known reactive airway disease. No pneumonia nor other acute cardiopulmonary abnormality. Electronically Signed   By: David  Martinique M.D.   On: 10/03/2018 10:55    Procedures Procedures (including critical care time)  Medications Ordered in ED Medications  albuterol (PROVENTIL HFA;VENTOLIN HFA) 108 (90 Base) MCG/ACT inhaler 2 puff (has no administration in time range)  ipratropium-albuterol (DUONEB) 0.5-2.5 (3) MG/3ML nebulizer solution 3 mL (3 mLs Nebulization Given 10/03/18 1017)  guaiFENesin-dextromethorphan (ROBITUSSIN DM) 100-10 MG/5ML syrup 10 mL (10 mLs Oral Given 10/03/18 1248)  ipratropium-albuterol (DUONEB) 0.5-2.5 (3) MG/3ML nebulizer solution 3 mL (3 mLs Nebulization Given 10/03/18 1428)  LORazepam (ATIVAN) injection 1 mg (1 mg Intramuscular Given 10/03/18 1536)     Initial Impression / Assessment and Plan / ED Course  I have reviewed the triage vital signs and the nursing notes.  Pertinent labs & imaging results that were available during my care of the patient were reviewed by me and considered in my medical decision making (see chart for details).     Has chest x-ray unremarkable.  Patient with possible upper respiratory infection and  bronchospasm.  She is given albuterol and doxycycline  Final Clinical Impressions(s) / ED Diagnoses   Final diagnoses:  Bronchitis    ED Discharge Orders         Ordered    doxycycline (VIBRAMYCIN) 100 MG capsule  2 times daily     10/03/18 1557           Milton Ferguson, MD 10/03/18 1600

## 2018-10-04 ENCOUNTER — Other Ambulatory Visit: Payer: Self-pay

## 2018-10-04 ENCOUNTER — Inpatient Hospital Stay (HOSPITAL_COMMUNITY)
Admission: EM | Admit: 2018-10-04 | Discharge: 2018-10-06 | DRG: 202 | Disposition: A | Discharge status: Home / Self Care | Payer: Self-pay | Attending: Oncology | Admitting: Oncology

## 2018-10-04 ENCOUNTER — Encounter (HOSPITAL_COMMUNITY): Payer: Self-pay

## 2018-10-04 ENCOUNTER — Emergency Department (HOSPITAL_COMMUNITY): Payer: Self-pay

## 2018-10-04 DIAGNOSIS — R7989 Other specified abnormal findings of blood chemistry: Secondary | ICD-10-CM

## 2018-10-04 DIAGNOSIS — F17210 Nicotine dependence, cigarettes, uncomplicated: Secondary | ICD-10-CM | POA: Diagnosis present

## 2018-10-04 DIAGNOSIS — J4541 Moderate persistent asthma with (acute) exacerbation: Principal | ICD-10-CM | POA: Diagnosis present

## 2018-10-04 DIAGNOSIS — J45901 Unspecified asthma with (acute) exacerbation: Secondary | ICD-10-CM

## 2018-10-04 DIAGNOSIS — R0789 Other chest pain: Secondary | ICD-10-CM | POA: Diagnosis present

## 2018-10-04 DIAGNOSIS — Z79899 Other long term (current) drug therapy: Secondary | ICD-10-CM

## 2018-10-04 DIAGNOSIS — J40 Bronchitis, not specified as acute or chronic: Secondary | ICD-10-CM | POA: Diagnosis present

## 2018-10-04 DIAGNOSIS — Z23 Encounter for immunization: Secondary | ICD-10-CM

## 2018-10-04 DIAGNOSIS — F319 Bipolar disorder, unspecified: Secondary | ICD-10-CM

## 2018-10-04 DIAGNOSIS — J209 Acute bronchitis, unspecified: Secondary | ICD-10-CM | POA: Diagnosis present

## 2018-10-04 DIAGNOSIS — Z818 Family history of other mental and behavioral disorders: Secondary | ICD-10-CM

## 2018-10-04 DIAGNOSIS — J069 Acute upper respiratory infection, unspecified: Secondary | ICD-10-CM

## 2018-10-04 DIAGNOSIS — F314 Bipolar disorder, current episode depressed, severe, without psychotic features: Secondary | ICD-10-CM | POA: Diagnosis present

## 2018-10-04 DIAGNOSIS — B9789 Other viral agents as the cause of diseases classified elsewhere: Secondary | ICD-10-CM

## 2018-10-04 DIAGNOSIS — Z8249 Family history of ischemic heart disease and other diseases of the circulatory system: Secondary | ICD-10-CM

## 2018-10-04 DIAGNOSIS — Z813 Family history of other psychoactive substance abuse and dependence: Secondary | ICD-10-CM

## 2018-10-04 DIAGNOSIS — R778 Other specified abnormalities of plasma proteins: Secondary | ICD-10-CM

## 2018-10-04 DIAGNOSIS — Z833 Family history of diabetes mellitus: Secondary | ICD-10-CM

## 2018-10-04 DIAGNOSIS — Z720 Tobacco use: Secondary | ICD-10-CM

## 2018-10-04 DIAGNOSIS — D649 Anemia, unspecified: Secondary | ICD-10-CM

## 2018-10-04 LAB — CBC
HCT: 33.8 % — ABNORMAL LOW (ref 36.0–46.0)
Hemoglobin: 10.6 g/dL — ABNORMAL LOW (ref 12.0–15.0)
MCH: 27.6 pg (ref 26.0–34.0)
MCHC: 31.4 g/dL (ref 30.0–36.0)
MCV: 88 fL (ref 80.0–100.0)
PLATELETS: 217 10*3/uL (ref 150–400)
RBC: 3.84 MIL/uL — AB (ref 3.87–5.11)
RDW: 20.6 % — AB (ref 11.5–15.5)
WBC: 7.4 10*3/uL (ref 4.0–10.5)
nRBC: 0 % (ref 0.0–0.2)

## 2018-10-04 LAB — TROPONIN I

## 2018-10-04 LAB — BASIC METABOLIC PANEL
Anion gap: 8 (ref 5–15)
BUN: 12 mg/dL (ref 6–20)
CALCIUM: 9.1 mg/dL (ref 8.9–10.3)
CO2: 25 mmol/L (ref 22–32)
CREATININE: 0.87 mg/dL (ref 0.44–1.00)
Chloride: 104 mmol/L (ref 98–111)
GFR calc non Af Amer: >60 mL/min (ref >60)
Glucose, Bld: 104 mg/dL — ABNORMAL HIGH (ref 70–99)
Potassium: 4.3 mmol/L (ref 3.5–5.1)
Sodium: 137 mmol/L (ref 135–145)

## 2018-10-04 LAB — I-STAT TROPONIN, ED: Troponin i, poc: 0.1 ng/mL (ref 0.00–0.08)

## 2018-10-04 LAB — RAPID URINE DRUG SCREEN, HOSP PERFORMED
Amphetamines: NOT DETECTED
Barbiturates: NOT DETECTED
Benzodiazepines: NOT DETECTED
Cocaine: NOT DETECTED
Opiates: NOT DETECTED
Tetrahydrocannabinol: NOT DETECTED

## 2018-10-04 LAB — I-STAT BETA HCG BLOOD, ED (MC, WL, AP ONLY)

## 2018-10-04 MED ORDER — INFLUENZA VAC SPLIT QUAD 0.5 ML IM SUSY
0.5000 mL | PREFILLED_SYRINGE | INTRAMUSCULAR | Status: AC
  Administered 2018-10-05: 0.5 mL via INTRAMUSCULAR
  Filled 2018-10-04: qty 0.5

## 2018-10-04 MED ORDER — TRAZODONE HCL 50 MG PO TABS
50.0000 mg | ORAL_TABLET | Freq: Every evening | ORAL | Status: DC | PRN
  Administered 2018-10-04: 50 mg via ORAL
  Filled 2018-10-04: qty 1

## 2018-10-04 MED ORDER — ALBUTEROL SULFATE (2.5 MG/3ML) 0.083% IN NEBU
2.5000 mg | INHALATION_SOLUTION | Freq: Four times a day (QID) | RESPIRATORY_TRACT | Status: DC
  Administered 2018-10-04 – 2018-10-05 (×3): 2.5 mg via RESPIRATORY_TRACT
  Filled 2018-10-04 (×3): qty 3

## 2018-10-04 MED ORDER — IPRATROPIUM-ALBUTEROL 0.5-2.5 (3) MG/3ML IN SOLN: 3.0000 mL | Freq: Four times a day (QID) | RESPIRATORY_TRACT | Status: DC | PRN

## 2018-10-04 MED ORDER — SERTRALINE HCL 100 MG PO TABS
100.0000 mg | ORAL_TABLET | Freq: Every day | ORAL | Status: DC
  Administered 2018-10-04 – 2018-10-06 (×3): 100 mg via ORAL
  Filled 2018-10-04 (×3): qty 1

## 2018-10-04 MED ORDER — GUAIFENESIN-DM 100-10 MG/5ML PO SYRP
5.0000 mL | ORAL_SOLUTION | ORAL | Status: DC | PRN
  Administered 2018-10-04 – 2018-10-06 (×5): 5 mL via ORAL
  Filled 2018-10-04 (×5): qty 5

## 2018-10-04 MED ORDER — ASPIRIN 81 MG PO CHEW
324.0000 mg | CHEWABLE_TABLET | Freq: Once | ORAL | Status: AC
  Administered 2018-10-04: 324 mg via ORAL
  Filled 2018-10-04: qty 4

## 2018-10-04 MED ORDER — DICLOFENAC SODIUM 1 % TD GEL
2.0000 g | Freq: Four times a day (QID) | TRANSDERMAL | Status: DC
  Administered 2018-10-04 – 2018-10-06 (×5): 2 g via TOPICAL
  Filled 2018-10-04: qty 100

## 2018-10-04 MED ORDER — QUETIAPINE FUMARATE 300 MG PO TABS
300.0000 mg | ORAL_TABLET | Freq: Every day | ORAL | Status: DC
  Administered 2018-10-04 – 2018-10-05 (×2): 300 mg via ORAL
  Filled 2018-10-04 (×3): qty 1

## 2018-10-04 MED ORDER — PREDNISONE 20 MG PO TABS
40.0000 mg | ORAL_TABLET | Freq: Every day | ORAL | Status: DC
  Administered 2018-10-04 – 2018-10-06 (×3): 40 mg via ORAL
  Filled 2018-10-04 (×3): qty 2

## 2018-10-04 MED ORDER — IPRATROPIUM-ALBUTEROL 0.5-2.5 (3) MG/3ML IN SOLN
3.0000 mL | Freq: Once | RESPIRATORY_TRACT | Status: AC
  Administered 2018-10-04: 3 mL via RESPIRATORY_TRACT
  Filled 2018-10-04: qty 3

## 2018-10-04 MED ORDER — ENOXAPARIN SODIUM 40 MG/0.4ML ~~LOC~~ SOLN
40.0000 mg | SUBCUTANEOUS | Status: DC
  Administered 2018-10-05: 40 mg via SUBCUTANEOUS
  Filled 2018-10-04: qty 0.4

## 2018-10-04 MED ORDER — IPRATROPIUM-ALBUTEROL 0.5-2.5 (3) MG/3ML IN SOLN
3.0000 mL | Freq: Once | RESPIRATORY_TRACT | Status: DC
  Administered 2018-10-04: 3 mL via RESPIRATORY_TRACT
  Filled 2018-10-04: qty 3

## 2018-10-04 MED ORDER — ALBUTEROL SULFATE (2.5 MG/3ML) 0.083% IN NEBU
2.5000 mg | INHALATION_SOLUTION | Freq: Once | RESPIRATORY_TRACT | Status: AC
  Administered 2018-10-04: 2.5 mg via RESPIRATORY_TRACT
  Filled 2018-10-04: qty 3

## 2018-10-04 MED ORDER — ACETAMINOPHEN 325 MG PO TABS
650.0000 mg | ORAL_TABLET | Freq: Four times a day (QID) | ORAL | Status: DC | PRN
  Administered 2018-10-04 – 2018-10-05 (×3): 650 mg via ORAL
  Filled 2018-10-04 (×3): qty 2

## 2018-10-04 MED ORDER — SENNOSIDES-DOCUSATE SODIUM 8.6-50 MG PO TABS: 1.0000 | ORAL_TABLET | Freq: Every evening | ORAL | Status: DC | PRN

## 2018-10-04 MED ORDER — ACETAMINOPHEN 650 MG RE SUPP: 650.0000 mg | Freq: Four times a day (QID) | RECTAL | Status: DC | PRN

## 2018-10-04 MED ORDER — PNEUMOCOCCAL VAC POLYVALENT 25 MCG/0.5ML IJ INJ
0.5000 mL | INJECTION | INTRAMUSCULAR | Status: AC
  Administered 2018-10-05: 0.5 mL via INTRAMUSCULAR
  Filled 2018-10-04: qty 0.5

## 2018-10-04 MED ORDER — ALBUTEROL SULFATE (2.5 MG/3ML) 0.083% IN NEBU: 2.5000 mg | INHALATION_SOLUTION | RESPIRATORY_TRACT | Status: DC | PRN

## 2018-10-04 NOTE — ED Provider Notes (Addendum)
Complains of anterior chest pain onset a week ago worse with coughing, laughing or walking improved with remaining still.  She admits to coughing green mucus.  She admits using crack cocaine a month ago.  On exam no distress.  Heart regular rate and rhythm mildly tachycardic lungs expiratory wheezes   Orlie Dakin, MD 10/04/18 [place], MD 10/04/18 1429

## 2018-10-04 NOTE — ED Triage Notes (Signed)
Pt states that she has sore throat with back and chest pain. Pt reports productive cough with green mucus X7 days. No relief with inhaler or antibiotics that were started yesterday.

## 2018-10-04 NOTE — ED Provider Notes (Signed)
[facility] DEPARTMENT Provider Note   CSN: [id number] Arrival date & time: 10/04/18  1249     History   Chief Complaint Chief Complaint  Patient presents with  . Chest Pain    HPI Deborah Melendez is a 42 y.o. female.  HPI  42 year old female presents today with complaints of chest pain and upper respiratory infection.  Patient notes symptoms started approximately 1 week ago with rhinorrhea, nasal congestion and cough.  She notes she did not have chest pain associated with this. She notes worsening cough and congestion yesetrday.  She was seen in the emergency room and prescribed doxycycline and discharged home. After getting home she developed central chest pain which she did not have prior.  She notes symptoms are worsened with exertion, coughing, and deep inspiration.  She denies any history of pulmonary embolism, denies any lower extremity swelling or edema.  She denies any personal cardiac history, notes that her mother had an MI at the age of 33.  She notes a long-standing smoking history approximately 20 years of 1/2 pack/day, she notes a history of crack cocaine use most recently 1 month ago.    Past Medical History:  Diagnosis Date  . Bipolar 1 disorder, depressed, severe ([place]) 02/04/2017  . Cocaine abuse with cocaine-induced mood disorder ([place]) 07/27/2017  . Depression   . Homicidal ideations   . Manic behavior ([place])   . 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 MRSA (methicillin resistant Staphylococcus aureus)    surgery on finger 3 years ago  . 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 Suicidal ideation   . UTI (urinary tract infection) 10/08/2017    Patient Active Problem List   Diagnosis Date Noted  . Bronchitis 10/04/2018  . Major depressive disorder, recurrent episode with mixed features ([place]) 01/27/2018  .  Tobacco use disorder 05/22/2017  . Suicidal ideation   . Cannabis use disorder, severe, dependence ([place]) 01/20/2017  . Cocaine use disorder, severe, dependence ([place]) 08/08/2015    Past Surgical History:  Procedure Laterality Date  . FINGER SURGERY       OB History    Gravida  6   Para  4   Term      Preterm      AB      Living  4     SAB      TAB      Ectopic      Multiple      Live Births  0            Home Medications    Prior to Admission medications   Medication Sig Start Date End Date Taking? Authorizing Provider  doxycycline (VIBRAMYCIN) 100 MG capsule Take 1 capsule (100 mg total) by mouth 2 (two) times daily. One po bid x 7 days 10/03/18  Yes Milton Ferguson, MD  ferrous sulfate 325 (65 FE) MG tablet Take 1 tablet (325 mg total) by mouth 3 (three) times daily with meals. 08/29/18 10/04/18 Yes McNew, Tyson Babinski, MD  gabapentin (NEURONTIN) 600 MG tablet Take 1 tablet (600 mg total) by mouth 3 (three) times daily. 08/29/18 10/04/18 Yes McNew, Tyson Babinski, MD  hydrOXYzine (ATARAX/VISTARIL) 50 MG tablet Take 1 tablet (50 mg total) by mouth 3 (three) times daily as needed for anxiety. 08/29/18   McNew, Tyson Babinski, MD  QUEtiapine (SEROQUEL) 300 MG tablet Take 1 tablet (300 mg total)  by mouth at bedtime. 08/29/18   McNew, Tyson Babinski, MD  sertraline (ZOLOFT) 100 MG tablet Take 1 tablet (100 mg total) by mouth daily. 08/30/18   McNew, Tyson Babinski, MD  traZODone (DESYREL) 50 MG tablet Take 1 tablet (50 mg total) by mouth at bedtime as needed for sleep. 08/29/18   McNew, Tyson Babinski, MD    Family History Family History  Problem Relation Age of Onset  . Diabetes Mother   . Hypertension Mother   . Drug abuse Father   . Schizophrenia Maternal Aunt     Social History Social History   Tobacco Use  . Smoking status: Current Every Day Smoker    Packs/day: 1.00    Types: Cigarettes  . Smokeless tobacco: Never Used  . Tobacco comment: pt reported quiting three weeks ago  Substance Use Topics    . Alcohol use: Yes    Alcohol/week: 10.0 standard drinks    Types: 10 Shots of liquor per week    Comment: once a week drinks a bottle of vodka  . Drug use: Yes    Frequency: 1.0 times per week    Types: Cocaine, Marijuana    Comment: last use 1 mth on 10/03/18     Allergies   Patient has no known allergies.   Review of Systems Review of Systems  All other systems reviewed and are negative.  Physical Exam Updated Vital Signs BP 103/78   Pulse (!) 108   Temp (!) 97.5 F (36.4 C) (Oral)   Resp (!) 22   Ht 5\' 2"  (1.575 m)   Wt 79.8 kg   SpO2 100%   BMI 32.19 kg/m   Physical Exam  Constitutional: She is oriented to person, place, and time. She appears well-developed and well-nourished.  HENT:  Head: Normocephalic and atraumatic.  Eyes: Pupils are equal, round, and reactive to light. Conjunctivae are normal. Right eye exhibits no discharge. Left eye exhibits no discharge. No scleral icterus.  Neck: Normal range of motion. No JVD present. No tracheal deviation present.  Cardiovascular: Regular rhythm, normal heart sounds and intact distal pulses. Exam reveals no gallop and no friction rub.  No murmur heard. Pulmonary/Chest: Effort normal. No stridor.  Bilateral expiratory wheeze, no crackles, no respiratory distress  Musculoskeletal: She exhibits no edema.  Neurological: She is alert and oriented to person, place, and time. Coordination normal.  Psychiatric: She has a normal mood and affect. Her behavior is normal. Judgment and thought content normal.  Nursing note and vitals reviewed.    ED Treatments / Results  Labs (all labs ordered are listed, but only abnormal results are displayed) Labs Reviewed  BASIC METABOLIC PANEL - Abnormal; Notable for the following components:      Result Value   Glucose, Bld 104 (*)    All other components within normal limits  CBC - Abnormal; Notable for the following components:   RBC 3.84 (*)    Hemoglobin 10.6 (*)    HCT 33.8  (*)    RDW 20.6 (*)    All other components within normal limits  I-STAT TROPONIN, ED - Abnormal; Notable for the following components:   Troponin i, poc 0.10 (*)    All other components within normal limits  RESPIRATORY PANEL BY PCR  RAPID URINE DRUG SCREEN, HOSP PERFORMED  CBC  BASIC METABOLIC PANEL  I-STAT BETA HCG BLOOD, ED ([facility], [facility], [facility] ONLY)    EKG None   ED ECG REPORT  ED ECG REPORT  ED ECG  REPORT   Date: 10/04/2018  EKG Time: 5:14 PM  Rate: 115   Rhythm: sinus tachycardia  Axis: right  Intervals: normal  ST&T Change: no changes  Narrative Interpretation: Sinus tachycardia no ST elevation or depression, no T wave changes           Radiology Dg Chest 2 View  Result Date: 10/04/2018 CLINICAL DATA:  Chest pain, productive cough. EXAM: CHEST - 2 VIEW COMPARISON:  Radiographs of October 03, 2018. FINDINGS: The heart size and mediastinal contours are within normal limits. Both lungs are clear. No pneumothorax or pleural effusion is noted. The visualized skeletal structures are unremarkable. IMPRESSION: No active cardiopulmonary disease. Electronically Signed   By: Marijo Conception, M.D.   On: 10/04/2018 13:45   Dg Chest 2 View  Result Date: 10/03/2018 CLINICAL DATA:  Shortness of breath, URI symptoms for several days. Current smoker. History of asthma. EXAM: CHEST - 2 VIEW COMPARISON:  Chest x-ray of April 19, 2018 FINDINGS: The lungs are mildly hyperinflated. There is an azygos lobe anatomy on the right. There is no focal infiltrate. There is no pleural effusion. The heart and pulmonary vascularity are normal. The mediastinum is normal in width. The trachea is midline. IMPRESSION: Mild hyperinflation consistent with known reactive airway disease. No pneumonia nor other acute cardiopulmonary abnormality. Electronically Signed   By: David  Martinique M.D.   On: 10/03/2018 10:55    Procedures Procedures (including critical care time)  Medications Ordered in ED Medications    enoxaparin (LOVENOX) injection 40 mg (has no administration in time range)  acetaminophen (TYLENOL) tablet 650 mg (has no administration in time range)    Or  acetaminophen (TYLENOL) suppository 650 mg (has no administration in time range)  senna-docusate (Senokot-S) tablet 1 tablet (has no administration in time range)  guaiFENesin-dextromethorphan (ROBITUSSIN DM) 100-10 MG/5ML syrup 5 mL (has no administration in time range)  diclofenac sodium (VOLTAREN) 1 % transdermal gel 2 g (has no administration in time range)  ipratropium-albuterol (DUONEB) 0.5-2.5 (3) MG/3ML nebulizer solution 3 mL (has no administration in time range)  ipratropium-albuterol (DUONEB) 0.5-2.5 (3) MG/3ML nebulizer solution 3 mL (3 mLs Nebulization Given 10/04/18 1435)  aspirin chewable tablet 324 mg (324 mg Oral Given 10/04/18 1459)  albuterol (PROVENTIL) (2.5 MG/3ML) 0.083% nebulizer solution 2.5 mg (2.5 mg Nebulization Given 10/04/18 1619)     Initial Impression / Assessment and Plan / ED Course  I have reviewed the triage vital signs and the nursing notes.  Pertinent labs & imaging results that were available during my care of the patient were reviewed by me and considered in my medical decision making (see chart for details).     Labs: I-STAT troponin  Imaging: DG chest 2 view  Consults:  Therapeutics:  Discharge Meds:   Assessment/Plan: 42 year old female presents today with complaints of chest pain.  Patient does have what appears to be upper respiratory infection, she does also have chest pain described as an ache, worse with exertion.  Patient has a history of drug use, smoking, and a close family history of MI at an early age.  Her troponin is elevated here at 0.10, her last troponin V months ago was normal.  EKG reassuring, case discussed with Dr. Winfred Leeds who recommends hospital evaluation.    Final Clinical Impressions(s) / ED Diagnoses   Final diagnoses:  Viral URI with cough  Elevated  troponin    ED Discharge Orders    None       Amos Micheals, Dellis Filbert, PA-C  10/04/18 1844    Orlie Dakin, MD 10/04/18 2246

## 2018-10-04 NOTE — ED Notes (Signed)
PA notified of elevated troponin

## 2018-10-04 NOTE — H&P (Signed)
Date: 10/04/2018               Patient Name:  Deborah Melendez MRN: [id number]  DOB: [birth year] Age / Sex: 42 y.o., female   PCP: Patient, No Pcp Per         Medical Service: Internal Medicine Teaching Service         Attending Physician: Dr. Annia Belt, MD    First Contact: Dr. Annie Paras Pager: [phone]  Second Contact: Dr. Heber Coleman Pager: [phone]       After Hours (After 5p/  First Contact Pager: [phone]  weekends / holidays): Second Contact Pager: [phone]   Chief Complaint: chest pain  History of Present Illness: Deborah Melendez is a 42 yo woman with a history of bipolar disorder, asthma, and cocaine use disorder who presents with chest pain and dyspnea that began yesterday. She reports that for the past 7 days she has had a cough, rhinorrhea, and sinus pressure. Deborah cough is productive of a thick green mucous. She has tried tylenol and mucinex without relief. Over the past two days she has developed chest pain that starts at the base of the neck and moves down and around to the upper back. She describes the pain as pressure that worsens when she coughs. The shortness of breath occurs when she exerts herself and she has been unable to play with Deborah 66-year-old Melendez due to the dyspnea. She also reports wheezing. She has a history of asthma for which she uses an albuterol inhaler. She reports that she uses the inhaler 1-2 times per day and has not increased Deborah frequency of use recently. The albuterol sometimes helps Deborah symptoms, but not always. She endorses subjective fevers, night sweats, chills, and pressure-type headache. She denies nausea, vomiting, or diarrhea. She was seen for the cough and congestion in the ED yesterday on 10/7. She was diagnosed with a URI and given doxycycline, however Deborah symptoms have not improved.  Upon arrival to the ED, patient was afebrile, tachypneic to 24 and tachycardic to 108. BP was 107/96 and O2 sat was 100% on RA. Labs were  significant for Hb 10.6 and troponin 0.10. EKG without evidence of ischemia or changes from prior. CXR unremarkable. She was given aspirin 324 and a duoneb treatment.   Meds: Current Meds  Medication Sig  . doxycycline (VIBRAMYCIN) 100 MG capsule Take 1 capsule (100 mg total) by mouth 2 (two) times daily. One po bid x 7 days  . ferrous sulfate 325 (65 FE) MG tablet Take 1 tablet (325 mg total) by mouth 3 (three) times daily with meals.  . gabapentin (NEURONTIN) 600 MG tablet Take 1 tablet (600 mg total) by mouth 3 (three) times daily.    Allergies: Allergies as of 10/04/2018  . (No Known Allergies)   Past Medical History:  Diagnosis Date  . Bipolar 1 disorder, depressed, severe ([place]) 02/04/2017  . Cocaine abuse with cocaine-induced mood disorder ([place]) 07/27/2017  . Depression   . Homicidal ideations   . Manic behavior ([place])   . MDD (major depressive disorder), recurrent severe, without psychosis ([place]) 08/08/2015  . MRSA (methicillin resistant Staphylococcus aureus)    surgery on finger 3 years ago  . Substance or medication-induced bipolar and related disorder with onset during intoxication ([place]) 09/08/2016  . Suicidal ideation   . UTI (urinary tract infection) 10/08/2017   Past Surgical History:  Procedure Laterality Date  . FINGER SURGERY      Family History: Mother had  an MI at the age of 66  Social History: Lives with Deborah fiance and Deborah Melendez. Currently taking care of Deborah Melendez full-time. Smokes 3-4 packs of cigarettes per week. Denies etoh. Reports occasional crack cocaine use, most recently 2 months ago.   Review of Systems: A complete ROS was negative except as per HPI.   Physical Exam: Blood pressure 107/76, pulse (!) 104, temperature (!) 97.5 F (36.4 C), temperature source Oral, resp. rate (!) 24, height 5\' 2"  (1.575 m), weight 79.8 kg, SpO2 100 %.  Constitutional: Patient laying on Deborah side with a nebulizer mask on Deborah face for a breathing  treatment.  Eyes: Pupils are equal, round, and reactive to light. EOM are normal.  Cardiovascular: Normal rate and regular rhythm. No murmurs, rubs, or gallops. Pulmonary/Chest: Tachypneic with increased respiratory effort. Diffusely scattered expiratory wheezes.  Abdominal: Bowel sounds present. Soft, non-distended, non-tender. Ext: No lower extremity edema. Skin: Warm and dry. No rashes or wounds.  CXR: personally reviewed my interpretation is no focal opacities or pulmonary edema.  Assessment & Plan by Problem: Active Problems:   Acute bronchitis with asthma with acute exacerbation  [name] is a 42 yo woman with a history of bipolar disorder, asthma, and cocaine use disorder who presented with a 7-day history of productive cough and rhinorrhea as well as a 2-day history of chest pain, dyspnea, and wheezing. Deborah presentation is most consistent with bronchitis and asthma exacerbation.   Asthma exacerbation secondary to bronchitis - Afebrile without leukocytosis. Tachypneic, but satting well on room air. Exam notable for expiratory wheezes. CXR shows no focal opacities concerning for pneumonia.  - Deborah home asthma regimen includes albuterol, which she uses multiple times per day. She has never been hospitalized for Deborah asthma before. No PFTs found in system. Consider step-up maintenance therapy for Deborah asthma.  - Will treat URI symptomatically. Will treat asthma exacerbation with breathing treatments and steroid course. Plan - Tylenol prn for pain or fever - prednisone 40mg  PO  - albuterol q6hrs - robitussin q4hrs prn  - droplet precautions - am CBC and BMP  Chest pain - Deborah chest pain is most likely musculoskeletal strain from Deborah coughing as it is exacerbated by coughing and radiates into Deborah upper back. Deborah troponin is mildly elevated to 0.10. This may be in the setting of demand ischemia secondary to asthma exacerbation or Deborah history of cocaine use. EKG is not concerning for ACS.  She has never had an ischemic workup before.  - Low concern for PE given that the patient has no risk factors and no clinical signs or symptoms of DVT. Will reconsider if patient becomes hypoxic.  Plan - Telemetry - Trend troponin - Repeat EKG  - voltaren gel   Bipolar disorder - Continue home seroquel 300mg  qhs, sertraline 100mg  daily, and trazodone 50mg  qhs  FEN: no IV fluids, regular diet, replace electrolytes as needed  DVT ppx: [place] lovenox Code status: FULL code  Dispo: Admit patient to Observation with expected length of stay less than 2 midnights.  Signed: Corinne Ports, MD 10/04/2018, 7:03 PM  Pager: [phone]

## 2018-10-05 DIAGNOSIS — F458 Other somatoform disorders: Secondary | ICD-10-CM

## 2018-10-05 DIAGNOSIS — J40 Bronchitis, not specified as acute or chronic: Secondary | ICD-10-CM | POA: Diagnosis present

## 2018-10-05 LAB — BASIC METABOLIC PANEL
ANION GAP: 11 (ref 5–15)
BUN: 18 mg/dL (ref 6–20)
CHLORIDE: 108 mmol/L (ref 98–111)
CO2: 15 mmol/L — AB (ref 22–32)
Calcium: 8.8 mg/dL — ABNORMAL LOW (ref 8.9–10.3)
Creatinine, Ser: 1 mg/dL (ref 0.44–1.00)
GFR calc Af Amer: >60 mL/min (ref >60)
GFR calc non Af Amer: >60 mL/min (ref >60)
GLUCOSE: 189 mg/dL — AB (ref 70–99)
Potassium: 3.9 mmol/L (ref 3.5–5.1)
Sodium: 134 mmol/L — ABNORMAL LOW (ref 135–145)

## 2018-10-05 LAB — RESPIRATORY PANEL BY PCR
ADENOVIRUS-RVPPCR: NOT DETECTED
Bordetella pertussis: NOT DETECTED
CORONAVIRUS 229E-RVPPCR: NOT DETECTED
CORONAVIRUS HKU1-RVPPCR: NOT DETECTED
CORONAVIRUS NL63-RVPPCR: NOT DETECTED
Chlamydophila pneumoniae: NOT DETECTED
Coronavirus OC43: NOT DETECTED
Influenza A: NOT DETECTED
Influenza B: NOT DETECTED
METAPNEUMOVIRUS-RVPPCR: NOT DETECTED
Mycoplasma pneumoniae: NOT DETECTED
PARAINFLUENZA VIRUS 2-RVPPCR: NOT DETECTED
PARAINFLUENZA VIRUS 3-RVPPCR: NOT DETECTED
Parainfluenza Virus 1: NOT DETECTED
Parainfluenza Virus 4: NOT DETECTED
Respiratory Syncytial Virus: NOT DETECTED
Rhinovirus / Enterovirus: NOT DETECTED

## 2018-10-05 LAB — CBC
HEMATOCRIT: 34.7 % — AB (ref 36.0–46.0)
HEMOGLOBIN: 10.8 g/dL — AB (ref 12.0–15.0)
MCH: 26.1 pg (ref 26.0–34.0)
MCHC: 31.1 g/dL (ref 30.0–36.0)
MCV: 83.8 fL (ref 80.0–100.0)
NRBC: 0 % (ref 0.0–0.2)
Platelets: 201 10*3/uL (ref 150–400)
RBC: 4.14 MIL/uL (ref 3.87–5.11)
RDW: 20.2 % — ABNORMAL HIGH (ref 11.5–15.5)
WBC: 6.9 10*3/uL (ref 4.0–10.5)

## 2018-10-05 LAB — TROPONIN I: Troponin I: <0.03 ng/mL (ref <0.03)

## 2018-10-05 MED ORDER — ALBUTEROL SULFATE (2.5 MG/3ML) 0.083% IN NEBU
2.5000 mg | INHALATION_SOLUTION | RESPIRATORY_TRACT | Status: DC | PRN
  Administered 2018-10-06: 2.5 mg via RESPIRATORY_TRACT
  Filled 2018-10-05: qty 3

## 2018-10-05 MED ORDER — IPRATROPIUM-ALBUTEROL 0.5-2.5 (3) MG/3ML IN SOLN
3.0000 mL | RESPIRATORY_TRACT | Status: DC
  Administered 2018-10-05 – 2018-10-06 (×3): 3 mL via RESPIRATORY_TRACT
  Filled 2018-10-05 (×8): qty 3

## 2018-10-05 NOTE — Progress Notes (Signed)
   Subjective: No overnight events. Deborah Melendez reports that she feels better than yesterday, however she continues to have shortness of breath and wheezing between nebulizer treatments. She also continues to have primarily right lateral chest wall pain that is worsened with deep breaths and coughing. She reports this pain is well-controlled currently. She has no new symptoms or concerns.  Objective:  Vital signs in last 24 hours: Vitals:   10/04/18 2056 10/04/18 2355 10/05/18 0800 10/05/18 1123  BP:  126/80 110/90   Pulse:  98 98   Resp:  18    Temp: 98.4 F (36.9 C) 98.4 F (36.9 C) 98.1 F (36.7 C)   TempSrc:  Oral Oral   SpO2:  99% 98% 98%  Weight:      Height:       Constitutional: Patient sitting up comfortably in bed.  Cardiovascular:Normal rateand regular rhythm. No murmurs, rubs, or gallops. Pulmonary/Chest: Normal respiratory effort. Audible wheezing. Diffusely scattered expiratory wheezes. TTP on the left lateral chest wall. Ext: No lower extremity edema. Skin: Warm and dry. No rashes or wounds.  Assessment/Plan:  Active Problems:   Acute bronchitis with asthma with acute exacerbation  Deborah Melendez is a 42 yo woman with a history of bipolar disorder, asthma, and cocaine use disorder who presented with a 7-day history of productive cough and rhinorrhea as well as a 2-day history of chest pain, dyspnea, and wheezing. Her presentation is most consistent with bronchitis and asthma exacerbation.   Asthma exacerbation secondary to bronchitis - Appears improved today, but continues to have wheezing on exam. She has not required supplemental oxygen. Will increase her breathing treatment regimen and continue steroid course. Treating URI symptomatically. - Her home asthma regimen includes albuterol, which she uses multiple times per day. She has never been hospitalized for her asthma before. No PFTs found in system. Consider step-up maintenance therapy for her asthma.  Plan -  Duonebs q4hrs - Albuterol q4hrs PRN - prednisone 40mg  PO (day 2/5) - robitussin q4hrs prn  - droplet precautions - am BMP  Chest pain - Her chest pain is most likely musculoskeletal strain from her coughing as it is exacerbated by coughing and is reproducible on exam. Repeat troponins have been undetectable x2. Repeat EKG showed sinus tachycardia to 103 without ischemic changes. Plan - Tylenol prn - voltaren gel   Dispo: Anticipated discharge in approximately 1-2 days.  Dorrell, Andree Elk, MD 10/05/2018, 11:49 AM Pager: [phone]

## 2018-10-06 LAB — BASIC METABOLIC PANEL
ANION GAP: 7 (ref 5–15)
BUN: 10 mg/dL (ref 6–20)
CHLORIDE: 106 mmol/L (ref 98–111)
CO2: 22 mmol/L (ref 22–32)
Calcium: 8.2 mg/dL — ABNORMAL LOW (ref 8.9–10.3)
Creatinine, Ser: 0.85 mg/dL (ref 0.44–1.00)
GFR calc non Af Amer: >60 mL/min (ref >60)
Glucose, Bld: 119 mg/dL — ABNORMAL HIGH (ref 70–99)
Potassium: 3.5 mmol/L (ref 3.5–5.1)
SODIUM: 135 mmol/L (ref 135–145)

## 2018-10-06 MED ORDER — ALBUTEROL SULFATE HFA 108 (90 BASE) MCG/ACT IN AERS: 2.0000 | INHALATION_SPRAY | Freq: Four times a day (QID) | RESPIRATORY_TRACT | 2 refills | Status: DC | PRN

## 2018-10-06 MED ORDER — BUDESONIDE 90 MCG/ACT IN AEPB: 1.0000 | INHALATION_SPRAY | Freq: Two times a day (BID) | RESPIRATORY_TRACT | 1 refills | Status: DC

## 2018-10-06 MED ORDER — PREDNISONE 20 MG PO TABS: 40.0000 mg | ORAL_TABLET | Freq: Every day | ORAL | 0 refills | Status: DC

## 2018-10-06 MED ORDER — IPRATROPIUM-ALBUTEROL 0.5-2.5 (3) MG/3ML IN SOLN: 3.0000 mL | Freq: Two times a day (BID) | RESPIRATORY_TRACT | Status: DC

## 2018-10-06 MED ORDER — ACETAMINOPHEN 325 MG PO TABS: 650.0000 mg | ORAL_TABLET | Freq: Four times a day (QID) | ORAL | 0 refills | Status: DC | PRN

## 2018-10-06 MED FILL — predniSONE 20 MG TABS: 20 | 1 days supply | Qty: 2 | Fill #0

## 2018-10-06 MED FILL — FLOVENT HFA 44 MCG INHALER: 44 | 30 days supply | Qty: 11 | Fill #0

## 2018-10-06 MED FILL — VENTOLIN HFA 90 MCG INHALER: 108 (90 BAS | 25 days supply | Qty: 18 | Fill #0

## 2018-10-06 NOTE — Discharge Summary (Addendum)
Name: Deborah Melendez MRN: [id number] DOB: [birth year] 42 y.o. PCP: Patient, No Pcp Per  Date of Admission: 10/04/2018  1:03 PM Date of Discharge: 10/06/18 Attending Physician: Annia Belt, MD  Discharge Diagnosis: 1. Asthma exacerbation secondary to acute bronchitis 2. Atypical chest pain  Discharge Medications: Allergies as of 10/06/2018   No Known Allergies     Medication List    STOP taking these medications   doxycycline 100 MG capsule Commonly known as:  VIBRAMYCIN     TAKE these medications   acetaminophen 325 MG tablet Commonly known as:  TYLENOL Take 2 tablets (650 mg total) by mouth every 6 (six) hours as needed for mild pain (or Fever >/= 101).   albuterol 108 (90 Base) MCG/ACT inhaler Commonly known as:  PROVENTIL HFA;VENTOLIN HFA Inhale 2 puffs into the lungs every 6 (six) hours as needed for wheezing or shortness of breath.   Budesonide 90 MCG/ACT inhaler Inhale 1 puff into the lungs 2 (two) times daily.   ferrous sulfate 325 (65 FE) MG tablet Take 1 tablet (325 mg total) by mouth 3 (three) times daily with meals.   gabapentin 600 MG tablet Commonly known as:  NEURONTIN Take 1 tablet (600 mg total) by mouth 3 (three) times daily.   hydrOXYzine 50 MG tablet Commonly known as:  ATARAX/VISTARIL Take 1 tablet (50 mg total) by mouth 3 (three) times daily as needed for anxiety.   predniSONE 20 MG tablet Commonly known as:  DELTASONE Take 2 tablets (40 mg total) by mouth daily with breakfast. Start taking on:  10/07/2018   QUEtiapine 300 MG tablet Commonly known as:  SEROQUEL Take 1 tablet (300 mg total) by mouth at bedtime.   sertraline 100 MG tablet Commonly known as:  ZOLOFT Take 1 tablet (100 mg total) by mouth daily.   traZODone 50 MG tablet Commonly known as:  DESYREL Take 1 tablet (50 mg total) by mouth at bedtime as needed for sleep.       Disposition and follow-up:   Deborah Melendez was discharged from  [facility] in Good condition.  At the hospital follow up visit please address:  1.   Asthma: Started on maintenance therapy with ICS because she was using albuterol multiple times a day at baseline. Patient was given her inhalers before leaving the hospital because she reported being unable to afford them. Please address barriers to obtaining medications at her next PCP appointment.    Smoking cessation: The patient provided interest in smoking cessation counseling during this admission. Please discuss further at her next PCP visit.  2.  Labs / imaging needed at time of follow-up: none  3.  Pending labs/ test needing follow-up: none  Follow-up Appointments: Follow-up Information    [facility] Follow up on 10/14/2018.   Why:  9:30 for hospital follow up phone is [phone] Contact information: [address] [place] [address]          Hospital Course by problem list: Deborah Melendez is a 42 yo woman with a history of bipolar disorder, asthma, and cocaine use disorder who presented with a 7-day history of productive cough and rhinorrhea as well as a 2-day history of chest pain, dyspnea, and wheezing. Her presentation is most consistent with bronchitis and asthma exacerbation.   1. Asthma exacerbation secondary to acute bronchitis: Initial exam was significant for labored respirations, tachypnea, and audible wheezing. CXR showed no focal opacities concerning for  pneumonia. She was afebrile, without leukocytosis, and oxygenating well on room air. She was treated with duonebs and albuterol nebs and started on a 5 day course of prednisone. Upon discharge, her symptoms were much improved and her lungs were clear on exam. Her home asthma regimen includes albuterol, which she uses multiple times per day at baseline. Discharged the patient with ICS plus albuterol for asthma maintenance therapy. Her discharge medications were delivered  to her at bedside by the pharmacy team. She has scheduled follow-up with her PCP next week.   2. Atypical chest pain: Patient reported primarily left lateral chest pain exacerbated by coughing. Most likely a musculoskeletal strain given normal EKG, negative troponins, and reproducible pain on exam. Her pain was treated with tylenol and voltaren gel.   Discharge Vitals:   BP 116/89 (BP Location: Right Arm)   Pulse 100   Temp 98.4 F (36.9 C) (Oral)   Resp 20   Ht 5\' 2"  (1.575 m)   Wt 79.8 kg   SpO2 98%   BMI 32.19 kg/m   Pertinent Labs, Studies, and Procedures:  CBC Latest Ref Rng & Units 10/05/2018 10/04/2018 10/03/2018  WBC 4.0 - 10.5 K/uL 6.9 7.4 6.3  Hemoglobin 12.0 - 15.0 g/dL 10.8(L) 10.6(L) 10.4(L)  Hematocrit 36.0 - 46.0 % 34.7(L) 33.8(L) 32.6(L)  Platelets 150 - 400 K/uL 201 217 205   CMP Latest Ref Rng & Units 10/06/2018 10/05/2018 10/04/2018  Glucose 70 - 99 mg/dL 119(H) 189(H) 104(H)  BUN 6 - 20 mg/dL 10 18 12   Creatinine 0.44 - 1.00 mg/dL 0.85 1.00 0.87  Sodium 135 - 145 mmol/L 135 134(L) 137  Potassium 3.5 - 5.1 mmol/L 3.5 3.9 4.3  Chloride 98 - 111 mmol/L 106 108 104  CO2 22 - 32 mmol/L 22 15(L) 25  Calcium 8.9 - 10.3 mg/dL 8.2(L) 8.8(L) 9.1  Total Protein 6.5 - 8.1 g/dL - - -  Total Bilirubin 0.3 - 1.2 mg/dL - - -  Alkaline Phos 38 - 126 U/L - - -  AST 15 - 41 U/L - - -  ALT 0 - 44 U/L - - -   CXR: No active cardiopulmonary disease.  Discharge Instructions: Discharge Instructions    Discharge instructions   Complete by:  As directed    It was a pleasure taking care of you, Deborah Melendez!  1. You were hospitalized for an asthma exacerbation that was probably caused by a viral upper respiratory infection. The chest pain you were experiencing is a muscle strain from all the coughing. We ruled out life-threatening causes of chest pain during your admission.   2. For your asthma - Take 2 more days of prednisone (40mg  daily) to help decrease the inflammation in your  airways. - Start using the budesonide inhaler. Take 1 puff twice a day. Take this everyday even if you don't feel chest tightness or shortness of breath. - Use the albuterol inhaler if you become short of breath.   3. For your chest pain: You can take tylenol 650mg  q6hrs for pain. Please avoid using crack cocaine, as this can also cause chest pain and heart issues.  4. Please follow-up with your PCP for a hospital follow-up as scheduled. See a doctor sooner if you have worsening shortness of breath or chest pain.   Feel free to call our clinic if you have any questions at [phone].  Thanks! Dr. Annie Paras      Signed: Ronnett Pullin, Andree Elk, MD 10/06/2018, 1:55 PM   Pager: [phone]

## 2018-10-06 NOTE — Care Management Note (Signed)
Case Management Note  Patient Details  Name: Deborah Melendez MRN: [id number] Date of Birth: [birth year]  Subjective/Objective:   From home, presents with asthma ex. pta indep, NCM scheduled a f/u apt at the [facility] 10/18 at 9:30 for hospital follow up.  She received med ast from the pharmacy with her medications prior to dc.                 Action/Plan: DC home.  Expected Discharge Date:  10/06/18               Expected Discharge Plan:  Home/Self Care  In-House Referral:     Discharge planning Services  CM Consult, [facility], Follow-up appt scheduled, Medication Assistance  Post Acute Care Choice:    Choice offered to:     DME Arranged:    DME Agency:     HH Arranged:    HH Agency:     Status of Service:  Completed, signed off  If discussed at [employer], dates discussed:    Additional Comments:  Zenon Mayo, RN 10/06/2018, 2:25 PM

## 2018-10-06 NOTE — Progress Notes (Signed)
Medicine attending discharge note: I personally examined this patient on the day of discharge and I attest to the accuracy of the discharge evaluation and plan as recorded in the final progress note and which will be subsequently detailed in the discharge summary by resident physician Dr Vilma Prader.  42 year old woman with known asthma who presents with an acute exacerbation precipitated by a viral illness with a 7-day history of productive cough, rhinorrhea, and sinus pressure, headache, subjective fever, sweats, and chills.  She had left-sided rib pain from persistent coughing.  Chest radiograph showed no infiltrate or effusion.  She was oxygenating well on room air.  No elevation of her white blood count.  She had diffuse expiratory wheezes over the lung fields.  A sense of air hunger. She was treated symptomatically with bronchodilators, hydration, and steroids. She had rapid improvement in her symptoms.  Scattered minimal end expiratory wheezing at time of discharge.  Disposition: Condition stable at time of discharge There were no complications

## 2018-10-06 NOTE — Progress Notes (Signed)
   Subjective: No overnight events. Ms. Behler reports that she feels great today and is ready to go home. Her breathing has improved greatly. She has no new symptoms or concerns.   Objective:  Vital signs in last 24 hours: Vitals:   10/05/18 1508 10/05/18 1917 10/05/18 2346 10/06/18 0155  BP: 127/77  120/83   Pulse: (!) 112  (!) 104   Resp: 15     Temp: 98.9 F (37.2 C)  98.3 F (36.8 C)   TempSrc: Oral  Oral   SpO2: 95% 99% 99% 97%  Weight:      Height:       Constitutional:Patient sitting up comfortably in bed.  Cardiovascular:Normal rateand regular rhythm. No murmurs, rubs, or gallops. Pulmonary/Chest: Normal respiratory effort. Clear to auscultation bilaterally without wheezing. Ext: No lower extremity edema. Skin: Warm and dry. No rashes or wounds.  Assessment/Plan:  Active Problems:   Acute bronchitis with asthma with acute exacerbation   Bronchitis  Ms. Goshert is a 42 yo woman with a history of bipolar disorder, asthma, and cocaineuse disorder who presented with a 7-day history of productive cough and rhinorrhea as well as a 2-day history ofchest pain,dyspnea, and wheezing. Her presentation is most consistent with bronchitis and asthma exacerbation.   Asthma exacerbation secondary to bronchitis - Appears much improved today. Exam has significantly improved as she no longer has any wheezing. She is appropriate for discharge today.  - Her home asthma regimen includes only albuterol, which she uses multiple times per day. Will step up therapy by adding an inhaled corticosteroid.  Plan - Discharge home with albuterol and budesonide - Discharge home with prednisone 40mg  PO (day 3/5)  Dispo: Anticipated discharge today  Munirah Doerner, Andree Elk, MD 10/06/2018, 6:14 AM Pager: [phone]

## 2018-10-14 ENCOUNTER — Emergency Department (HOSPITAL_COMMUNITY): Payer: Self-pay

## 2018-10-14 ENCOUNTER — Other Ambulatory Visit: Payer: Self-pay

## 2018-10-14 ENCOUNTER — Inpatient Hospital Stay (HOSPITAL_COMMUNITY)
Admission: EM | Admit: 2018-10-14 | Discharge: 2018-10-28 | DRG: 207 | Disposition: A | Discharge status: Home / Self Care | Payer: Self-pay | Attending: Internal Medicine | Admitting: Internal Medicine

## 2018-10-14 ENCOUNTER — Inpatient Hospital Stay (HOSPITAL_COMMUNITY): Payer: Self-pay

## 2018-10-14 ENCOUNTER — Ambulatory Visit: Payer: Self-pay | Admitting: Family Medicine

## 2018-10-14 ENCOUNTER — Encounter (HOSPITAL_COMMUNITY): Payer: Self-pay

## 2018-10-14 DIAGNOSIS — R410 Disorientation, unspecified: Secondary | ICD-10-CM | POA: Diagnosis not present

## 2018-10-14 DIAGNOSIS — R739 Hyperglycemia, unspecified: Secondary | ICD-10-CM | POA: Diagnosis present

## 2018-10-14 DIAGNOSIS — J440 Chronic obstructive pulmonary disease with acute lower respiratory infection: Secondary | ICD-10-CM | POA: Diagnosis present

## 2018-10-14 DIAGNOSIS — J9621 Acute and chronic respiratory failure with hypoxia: Principal | ICD-10-CM

## 2018-10-14 DIAGNOSIS — R4182 Altered mental status, unspecified: Secondary | ICD-10-CM

## 2018-10-14 DIAGNOSIS — J202 Acute bronchitis due to streptococcus: Secondary | ICD-10-CM | POA: Diagnosis present

## 2018-10-14 DIAGNOSIS — J441 Chronic obstructive pulmonary disease with (acute) exacerbation: Secondary | ICD-10-CM

## 2018-10-14 DIAGNOSIS — Z01818 Encounter for other preprocedural examination: Secondary | ICD-10-CM

## 2018-10-14 DIAGNOSIS — J9600 Acute respiratory failure, unspecified whether with hypoxia or hypercapnia: Secondary | ICD-10-CM

## 2018-10-14 DIAGNOSIS — J9601 Acute respiratory failure with hypoxia: Secondary | ICD-10-CM

## 2018-10-14 DIAGNOSIS — G7281 Critical illness myopathy: Secondary | ICD-10-CM | POA: Diagnosis present

## 2018-10-14 DIAGNOSIS — E872 Acidosis: Secondary | ICD-10-CM | POA: Diagnosis present

## 2018-10-14 DIAGNOSIS — Z0189 Encounter for other specified special examinations: Secondary | ICD-10-CM

## 2018-10-14 DIAGNOSIS — Z978 Presence of other specified devices: Secondary | ICD-10-CM

## 2018-10-14 DIAGNOSIS — R0600 Dyspnea, unspecified: Secondary | ICD-10-CM

## 2018-10-14 DIAGNOSIS — Z79899 Other long term (current) drug therapy: Secondary | ICD-10-CM

## 2018-10-14 DIAGNOSIS — E8770 Fluid overload, unspecified: Secondary | ICD-10-CM | POA: Diagnosis present

## 2018-10-14 DIAGNOSIS — F314 Bipolar disorder, current episode depressed, severe, without psychotic features: Secondary | ICD-10-CM | POA: Diagnosis present

## 2018-10-14 DIAGNOSIS — Z452 Encounter for adjustment and management of vascular access device: Secondary | ICD-10-CM

## 2018-10-14 DIAGNOSIS — J45902 Unspecified asthma with status asthmaticus: Secondary | ICD-10-CM | POA: Diagnosis present

## 2018-10-14 DIAGNOSIS — G92 Toxic encephalopathy: Secondary | ICD-10-CM | POA: Diagnosis present

## 2018-10-14 DIAGNOSIS — Z4659 Encounter for fitting and adjustment of other gastrointestinal appliance and device: Secondary | ICD-10-CM

## 2018-10-14 DIAGNOSIS — Z7951 Long term (current) use of inhaled steroids: Secondary | ICD-10-CM

## 2018-10-14 DIAGNOSIS — J9690 Respiratory failure, unspecified, unspecified whether with hypoxia or hypercapnia: Secondary | ICD-10-CM

## 2018-10-14 DIAGNOSIS — F17210 Nicotine dependence, cigarettes, uncomplicated: Secondary | ICD-10-CM | POA: Diagnosis present

## 2018-10-14 DIAGNOSIS — D638 Anemia in other chronic diseases classified elsewhere: Secondary | ICD-10-CM | POA: Diagnosis present

## 2018-10-14 DIAGNOSIS — E876 Hypokalemia: Secondary | ICD-10-CM | POA: Diagnosis not present

## 2018-10-14 DIAGNOSIS — Z9289 Personal history of other medical treatment: Secondary | ICD-10-CM

## 2018-10-14 DIAGNOSIS — F1410 Cocaine abuse, uncomplicated: Secondary | ICD-10-CM | POA: Diagnosis present

## 2018-10-14 DIAGNOSIS — T380X5A Adverse effect of glucocorticoids and synthetic analogues, initial encounter: Secondary | ICD-10-CM | POA: Diagnosis present

## 2018-10-14 LAB — I-STAT CHEM 8, ED
BUN: 5 mg/dL — AB (ref 6–20)
CALCIUM ION: 1.06 mmol/L — AB (ref 1.15–1.40)
CHLORIDE: 109 mmol/L (ref 98–111)
CREATININE: 0.8 mg/dL (ref 0.44–1.00)
GLUCOSE: 127 mg/dL — AB (ref 70–99)
HCT: 33 % — ABNORMAL LOW (ref 36.0–46.0)
Hemoglobin: 11.2 g/dL — ABNORMAL LOW (ref 12.0–15.0)
Potassium: 3.6 mmol/L (ref 3.5–5.1)
Sodium: 141 mmol/L (ref 135–145)
TCO2: 20 mmol/L — AB (ref 22–32)

## 2018-10-14 LAB — COMPREHENSIVE METABOLIC PANEL
ALT: 15 U/L (ref 0–44)
ANION GAP: 9 (ref 5–15)
AST: 29 U/L (ref 15–41)
Albumin: 3.4 g/dL — ABNORMAL LOW (ref 3.5–5.0)
Alkaline Phosphatase: 58 U/L (ref 38–126)
BUN: 5 mg/dL — ABNORMAL LOW (ref 6–20)
CHLORIDE: 111 mmol/L (ref 98–111)
CO2: 20 mmol/L — ABNORMAL LOW (ref 22–32)
CREATININE: 0.83 mg/dL (ref 0.44–1.00)
Calcium: 8.1 mg/dL — ABNORMAL LOW (ref 8.9–10.3)
Glucose, Bld: 131 mg/dL — ABNORMAL HIGH (ref 70–99)
Potassium: 3.6 mmol/L (ref 3.5–5.1)
Sodium: 140 mmol/L (ref 135–145)
Total Bilirubin: 0.2 mg/dL — ABNORMAL LOW (ref 0.3–1.2)
Total Protein: 7 g/dL (ref 6.5–8.1)

## 2018-10-14 LAB — RAPID URINE DRUG SCREEN, HOSP PERFORMED
Amphetamines: NOT DETECTED
BENZODIAZEPINES: POSITIVE — AB
Barbiturates: NOT DETECTED
Cocaine: POSITIVE — AB
OPIATES: NOT DETECTED
Tetrahydrocannabinol: POSITIVE — AB

## 2018-10-14 LAB — BASIC METABOLIC PANEL
ANION GAP: 10 (ref 5–15)
BUN: 6 mg/dL (ref 6–20)
CHLORIDE: 107 mmol/L (ref 98–111)
CO2: 18 mmol/L — ABNORMAL LOW (ref 22–32)
Calcium: 8 mg/dL — ABNORMAL LOW (ref 8.9–10.3)
Creatinine, Ser: 0.92 mg/dL (ref 0.44–1.00)
GLUCOSE: 206 mg/dL — AB (ref 70–99)
Potassium: 3.7 mmol/L (ref 3.5–5.1)
Sodium: 135 mmol/L (ref 135–145)

## 2018-10-14 LAB — CBC WITH DIFFERENTIAL/PLATELET
Abs Immature Granulocytes: 0.05 10*3/uL (ref 0.00–0.07)
BASOS PCT: 1 %
Basophils Absolute: 0.1 10*3/uL (ref 0.0–0.1)
EOS PCT: 5 %
Eosinophils Absolute: 0.5 10*3/uL (ref 0.0–0.5)
HCT: 32.2 % — ABNORMAL LOW (ref 36.0–46.0)
Hemoglobin: 9.9 g/dL — ABNORMAL LOW (ref 12.0–15.0)
Immature Granulocytes: 1 %
Lymphocytes Relative: 16 %
Lymphs Abs: 1.6 10*3/uL (ref 0.7–4.0)
MCH: 27.1 pg (ref 26.0–34.0)
MCHC: 30.7 g/dL (ref 30.0–36.0)
MCV: 88.2 fL (ref 80.0–100.0)
Monocytes Absolute: 0.7 10*3/uL (ref 0.1–1.0)
Monocytes Relative: 7 %
Neutro Abs: 7.2 10*3/uL (ref 1.7–7.7)
Neutrophils Relative %: 70 %
PLATELETS: 370 10*3/uL (ref 150–400)
RBC: 3.65 MIL/uL — AB (ref 3.87–5.11)
RDW: 21 % — AB (ref 11.5–15.5)
WBC: 10.2 10*3/uL (ref 4.0–10.5)
nRBC: 0 % (ref 0.0–0.2)

## 2018-10-14 LAB — GLUCOSE, CAPILLARY
GLUCOSE-CAPILLARY: 139 mg/dL — AB (ref 70–99)
GLUCOSE-CAPILLARY: 195 mg/dL — AB (ref 70–99)

## 2018-10-14 LAB — BRAIN NATRIURETIC PEPTIDE: B NATRIURETIC PEPTIDE 5: 28.3 pg/mL (ref 0.0–100.0)

## 2018-10-14 LAB — MAGNESIUM: MAGNESIUM: 2 mg/dL (ref 1.7–2.4)

## 2018-10-14 LAB — POCT I-STAT 3, ART BLOOD GAS (G3+)
Acid-base deficit: 4 mmol/L — ABNORMAL HIGH (ref 0.0–2.0)
BICARBONATE: 25 mmol/L (ref 20.0–28.0)
O2 SAT: 100 %
TCO2: 27 mmol/L (ref 22–32)
pCO2 arterial: 63.5 mmHg — ABNORMAL HIGH (ref 32.0–48.0)
pH, Arterial: 7.204 — ABNORMAL LOW (ref 7.350–7.450)
pO2, Arterial: 452 mmHg — ABNORMAL HIGH (ref 83.0–108.0)

## 2018-10-14 LAB — I-STAT TROPONIN, ED: Troponin i, poc: 0 ng/mL (ref 0.00–0.08)

## 2018-10-14 LAB — I-STAT VENOUS BLOOD GAS, ED
ACID-BASE DEFICIT: 5 mmol/L — AB (ref 0.0–2.0)
Bicarbonate: 20.1 mmol/L (ref 20.0–28.0)
O2 SAT: 87 %
PCO2 VEN: 36.6 mmHg — AB (ref 44.0–60.0)
TCO2: 21 mmol/L — AB (ref 22–32)
pH, Ven: 7.347 (ref 7.250–7.430)
pO2, Ven: 55 mmHg — ABNORMAL HIGH (ref 32.0–45.0)

## 2018-10-14 LAB — TRIGLYCERIDES: TRIGLYCERIDES: 44 mg/dL (ref <150)

## 2018-10-14 LAB — LACTIC ACID, PLASMA
LACTIC ACID, VENOUS: 1.7 mmol/L (ref 0.5–1.9)
LACTIC ACID, VENOUS: 0.9 mmol/L (ref 0.5–1.9)

## 2018-10-14 LAB — PROCALCITONIN: Procalcitonin: <0.1 ng/mL

## 2018-10-14 LAB — PHOSPHORUS: Phosphorus: 2.8 mg/dL (ref 2.5–4.6)

## 2018-10-14 LAB — MRSA PCR SCREENING: MRSA by PCR: NEGATIVE

## 2018-10-14 MED ORDER — IPRATROPIUM-ALBUTEROL 0.5-2.5 (3) MG/3ML IN SOLN: 3.0000 mL | Freq: Four times a day (QID) | RESPIRATORY_TRACT | Status: DC

## 2018-10-14 MED ORDER — BISACODYL 10 MG RE SUPP: 10.0000 mg | Freq: Every day | RECTAL | Status: DC | PRN

## 2018-10-14 MED ORDER — IOPAMIDOL (ISOVUE-370) INJECTION 76%
100.0000 mL | Freq: Once | INTRAVENOUS | Status: AC | PRN
  Administered 2018-10-14: 100 mL via INTRAVENOUS

## 2018-10-14 MED ORDER — QUETIAPINE FUMARATE 300 MG PO TABS
300.0000 mg | ORAL_TABLET | Freq: Every day | ORAL | Status: DC
  Administered 2018-10-14 – 2018-10-23 (×10): 300 mg
  Filled 2018-10-14: qty 1
  Filled 2018-10-14 (×2): qty 3
  Filled 2018-10-14 (×2): qty 1
  Filled 2018-10-14 (×2): qty 3
  Filled 2018-10-14 (×2): qty 1
  Filled 2018-10-14: qty 3
  Filled 2018-10-14 (×2): qty 1
  Filled 2018-10-14 (×4): qty 3
  Filled 2018-10-14 (×2): qty 1
  Filled 2018-10-14 (×2): qty 3
  Filled 2018-10-14 (×2): qty 1

## 2018-10-14 MED ORDER — VITAL HIGH PROTEIN PO LIQD
1000.0000 mL | ORAL | Status: DC
  Administered 2018-10-14 – 2018-10-17 (×4): 1000 mL

## 2018-10-14 MED ORDER — MAGNESIUM SULFATE 2 GM/50ML IV SOLN
2.0000 g | Freq: Once | INTRAVENOUS | Status: AC
  Administered 2018-10-14: 2 g via INTRAVENOUS
  Filled 2018-10-14: qty 50

## 2018-10-14 MED ORDER — DOCUSATE SODIUM 50 MG/5ML PO LIQD
100.0000 mg | Freq: Two times a day (BID) | ORAL | Status: DC | PRN
  Administered 2018-10-20 – 2018-10-22 (×2): 100 mg
  Filled 2018-10-14 (×2): qty 10

## 2018-10-14 MED ORDER — BUDESONIDE 0.5 MG/2ML IN SUSP
0.5000 mg | Freq: Two times a day (BID) | RESPIRATORY_TRACT | Status: DC
  Administered 2018-10-14 – 2018-10-28 (×28): 0.5 mg via RESPIRATORY_TRACT
  Filled 2018-10-14 (×31): qty 2

## 2018-10-14 MED ORDER — FENTANYL CITRATE (PF) 100 MCG/2ML IJ SOLN: 50.0000 ug | Freq: Once | INTRAMUSCULAR | Status: DC

## 2018-10-14 MED ORDER — GABAPENTIN 600 MG PO TABS
600.0000 mg | ORAL_TABLET | Freq: Three times a day (TID) | ORAL | Status: DC
  Administered 2018-10-14 – 2018-10-17 (×9): 600 mg
  Filled 2018-10-14 (×9): qty 1

## 2018-10-14 MED ORDER — SERTRALINE HCL 100 MG PO TABS
100.0000 mg | ORAL_TABLET | Freq: Every day | ORAL | Status: DC
  Administered 2018-10-14 – 2018-10-16 (×3): 100 mg
  Filled 2018-10-14 (×3): qty 1

## 2018-10-14 MED ORDER — FENTANYL 2500MCG IN NS 250ML (10MCG/ML) PREMIX INFUSION
25.0000 ug/h | INTRAVENOUS | Status: DC
  Administered 2018-10-14: 50 ug/h via INTRAVENOUS
  Administered 2018-10-15 – 2018-10-16 (×2): 150 ug/h via INTRAVENOUS
  Administered 2018-10-16: 400 ug/h via INTRAVENOUS
  Administered 2018-10-16: 350 ug/h via INTRAVENOUS
  Administered 2018-10-17 (×2): 400 ug/h via INTRAVENOUS
  Filled 2018-10-14 (×8): qty 250

## 2018-10-14 MED ORDER — HEPARIN SODIUM (PORCINE) 5000 UNIT/ML IJ SOLN
5000.0000 [IU] | Freq: Three times a day (TID) | INTRAMUSCULAR | Status: DC
  Administered 2018-10-14 – 2018-10-28 (×41): 5000 [IU] via SUBCUTANEOUS
  Filled 2018-10-14 (×42): qty 1

## 2018-10-14 MED ORDER — LORAZEPAM 2 MG/ML IJ SOLN
1.0000 mg | Freq: Once | INTRAMUSCULAR | Status: AC
  Administered 2018-10-14: 1 mg via INTRAVENOUS
  Filled 2018-10-14: qty 1

## 2018-10-14 MED ORDER — ETOMIDATE 2 MG/ML IV SOLN
INTRAVENOUS | Status: DC | PRN
  Administered 2018-10-14: 22 mg via INTRAVENOUS

## 2018-10-14 MED ORDER — ASPIRIN 300 MG RE SUPP: 300.0000 mg | RECTAL | Status: AC

## 2018-10-14 MED ORDER — INSULIN ASPART 100 UNIT/ML ~~LOC~~ SOLN
0.0000 [IU] | SUBCUTANEOUS | Status: DC
  Administered 2018-10-14: 3 [IU] via SUBCUTANEOUS
  Administered 2018-10-14: 2 [IU] via SUBCUTANEOUS
  Administered 2018-10-15: 3 [IU] via SUBCUTANEOUS
  Administered 2018-10-15 (×2): 2 [IU] via SUBCUTANEOUS
  Administered 2018-10-15 (×3): 3 [IU] via SUBCUTANEOUS
  Administered 2018-10-16: 5 [IU] via SUBCUTANEOUS
  Administered 2018-10-16: 3 [IU] via SUBCUTANEOUS
  Administered 2018-10-16 – 2018-10-17 (×8): 2 [IU] via SUBCUTANEOUS
  Administered 2018-10-18: 3 [IU] via SUBCUTANEOUS
  Administered 2018-10-18 (×3): 2 [IU] via SUBCUTANEOUS
  Administered 2018-10-18: 3 [IU] via SUBCUTANEOUS
  Administered 2018-10-19 – 2018-10-20 (×6): 2 [IU] via SUBCUTANEOUS
  Administered 2018-10-20: 3 [IU] via SUBCUTANEOUS
  Administered 2018-10-20: 2 [IU] via SUBCUTANEOUS
  Administered 2018-10-20: 3 [IU] via SUBCUTANEOUS
  Administered 2018-10-20 – 2018-10-21 (×3): 2 [IU] via SUBCUTANEOUS
  Administered 2018-10-21 (×3): 3 [IU] via SUBCUTANEOUS
  Administered 2018-10-21 (×2): 2 [IU] via SUBCUTANEOUS
  Administered 2018-10-22: 3 [IU] via SUBCUTANEOUS
  Administered 2018-10-22: 2 [IU] via SUBCUTANEOUS
  Administered 2018-10-22: 3 [IU] via SUBCUTANEOUS
  Administered 2018-10-22 – 2018-10-23 (×5): 2 [IU] via SUBCUTANEOUS
  Administered 2018-10-23: 3 [IU] via SUBCUTANEOUS
  Administered 2018-10-23 – 2018-10-24 (×4): 2 [IU] via SUBCUTANEOUS
  Administered 2018-10-24 – 2018-10-25 (×3): 3 [IU] via SUBCUTANEOUS
  Administered 2018-10-25: 2 [IU] via SUBCUTANEOUS
  Administered 2018-10-26: 5 [IU] via SUBCUTANEOUS
  Administered 2018-10-26: 3 [IU] via SUBCUTANEOUS
  Administered 2018-10-26 – 2018-10-27 (×3): 2 [IU] via SUBCUTANEOUS
  Administered 2018-10-27: 3 [IU] via SUBCUTANEOUS
  Administered 2018-10-28: 2 [IU] via SUBCUTANEOUS

## 2018-10-14 MED ORDER — CHLORHEXIDINE GLUCONATE 0.12 % MT SOLN
15.0000 mL | Freq: Once | OROMUCOSAL | Status: AC
  Administered 2018-10-14: 15 mL via OROMUCOSAL

## 2018-10-14 MED ORDER — FAMOTIDINE IN NACL 20-0.9 MG/50ML-% IV SOLN
20.0000 mg | Freq: Two times a day (BID) | INTRAVENOUS | Status: DC
  Administered 2018-10-14 – 2018-10-17 (×7): 20 mg via INTRAVENOUS
  Filled 2018-10-14 (×7): qty 50

## 2018-10-14 MED ORDER — METHYLPREDNISOLONE SODIUM SUCC 125 MG IJ SOLR
60.0000 mg | Freq: Four times a day (QID) | INTRAMUSCULAR | Status: DC
  Administered 2018-10-14 – 2018-10-16 (×10): 60 mg via INTRAVENOUS
  Filled 2018-10-14 (×10): qty 2

## 2018-10-14 MED ORDER — PROPOFOL 1000 MG/100ML IV EMUL
5.0000 ug/kg/min | INTRAVENOUS | Status: DC
  Administered 2018-10-14: 55 ug/kg/min via INTRAVENOUS
  Administered 2018-10-14: 60 ug/kg/min via INTRAVENOUS
  Administered 2018-10-14: 55 ug/kg/min via INTRAVENOUS
  Administered 2018-10-14: 5 ug/kg/min via INTRAVENOUS
  Administered 2018-10-15 (×4): 60 ug/kg/min via INTRAVENOUS
  Administered 2018-10-16: 50 ug/kg/min via INTRAVENOUS
  Administered 2018-10-16: 10 ug/kg/min via INTRAVENOUS
  Administered 2018-10-16 – 2018-10-17 (×2): 50 ug/kg/min via INTRAVENOUS
  Administered 2018-10-17: 5 ug/kg/min via INTRAVENOUS
  Administered 2018-10-17 – 2018-10-18 (×8): 50 ug/kg/min via INTRAVENOUS
  Administered 2018-10-19: 35 ug/kg/min via INTRAVENOUS
  Administered 2018-10-19: 50 ug/kg/min via INTRAVENOUS
  Administered 2018-10-19: 35 ug/kg/min via INTRAVENOUS
  Administered 2018-10-19: 50 ug/kg/min via INTRAVENOUS
  Administered 2018-10-19: 35 ug/kg/min via INTRAVENOUS
  Administered 2018-10-20: 45 ug/kg/min via INTRAVENOUS
  Administered 2018-10-20: 35 ug/kg/min via INTRAVENOUS
  Administered 2018-10-20: 65 ug/kg/min via INTRAVENOUS
  Administered 2018-10-20: 45 ug/kg/min via INTRAVENOUS
  Administered 2018-10-20: 80 ug/kg/min via INTRAVENOUS
  Administered 2018-10-20: 45 ug/kg/min via INTRAVENOUS
  Administered 2018-10-21: 80 ug/kg/min via INTRAVENOUS
  Administered 2018-10-21: 65 ug/kg/min via INTRAVENOUS
  Administered 2018-10-21 (×5): 70 ug/kg/min via INTRAVENOUS
  Administered 2018-10-21: 75 ug/kg/min via INTRAVENOUS
  Administered 2018-10-21: 65 ug/kg/min via INTRAVENOUS
  Administered 2018-10-22: 70 ug/kg/min via INTRAVENOUS
  Administered 2018-10-22: 50 ug/kg/min via INTRAVENOUS
  Administered 2018-10-22: 65 ug/kg/min via INTRAVENOUS
  Administered 2018-10-22: 60 ug/kg/min via INTRAVENOUS
  Administered 2018-10-22: 50 ug/kg/min via INTRAVENOUS
  Administered 2018-10-22: 70 ug/kg/min via INTRAVENOUS
  Administered 2018-10-22: 50 ug/kg/min via INTRAVENOUS
  Administered 2018-10-23 (×4): 60 ug/kg/min via INTRAVENOUS
  Filled 2018-10-14: qty 100
  Filled 2018-10-14: qty 200
  Filled 2018-10-14 (×10): qty 100
  Filled 2018-10-14: qty 200
  Filled 2018-10-14 (×37): qty 100

## 2018-10-14 MED ORDER — LORAZEPAM 2 MG/ML IJ SOLN
2.0000 mg | Freq: Once | INTRAMUSCULAR | Status: AC
  Administered 2018-10-14: 2 mg via INTRAVENOUS
  Filled 2018-10-14: qty 1

## 2018-10-14 MED ORDER — SODIUM CHLORIDE 0.9 % IV SOLN
INTRAVENOUS | Status: DC | PRN
  Administered 2018-10-14: 500 mL via INTRAVENOUS
  Administered 2018-10-19: 250 mL via INTRAVENOUS
  Administered 2018-10-20 – 2018-10-23 (×2): 500 mL via INTRAVENOUS

## 2018-10-14 MED ORDER — SODIUM CHLORIDE 0.9 % IV SOLN
500.0000 mg | INTRAVENOUS | Status: DC
  Administered 2018-10-14 – 2018-10-18 (×5): 500 mg via INTRAVENOUS
  Filled 2018-10-14 (×7): qty 500

## 2018-10-14 MED ORDER — ALBUTEROL SULFATE (2.5 MG/3ML) 0.083% IN NEBU
INHALATION_SOLUTION | RESPIRATORY_TRACT | Status: AC
  Filled 2018-10-14: qty 9

## 2018-10-14 MED ORDER — FENTANYL BOLUS VIA INFUSION
50.0000 ug | INTRAVENOUS | Status: DC | PRN
  Administered 2018-10-16: 50 ug via INTRAVENOUS
  Filled 2018-10-14: qty 50

## 2018-10-14 MED ORDER — FENTANYL CITRATE (PF) 100 MCG/2ML IJ SOLN
100.0000 ug | INTRAMUSCULAR | Status: DC | PRN
  Administered 2018-10-14 (×2): 100 ug via INTRAVENOUS
  Filled 2018-10-14 (×3): qty 2

## 2018-10-14 MED ORDER — LORAZEPAM 2 MG/ML IJ SOLN
INTRAMUSCULAR | Status: AC
  Filled 2018-10-14: qty 1

## 2018-10-14 MED ORDER — FENTANYL CITRATE (PF) 100 MCG/2ML IJ SOLN
100.0000 ug | INTRAMUSCULAR | Status: DC | PRN
  Administered 2018-10-14: 100 ug via INTRAVENOUS
  Filled 2018-10-14: qty 2

## 2018-10-14 MED ORDER — ORAL CARE MOUTH RINSE
15.0000 mL | OROMUCOSAL | Status: DC
  Administered 2018-10-14 – 2018-10-24 (×96): 15 mL via OROMUCOSAL

## 2018-10-14 MED ORDER — IOPAMIDOL (ISOVUE-370) INJECTION 76%
INTRAVENOUS | Status: AC
  Filled 2018-10-14: qty 100

## 2018-10-14 MED ORDER — IPRATROPIUM-ALBUTEROL 0.5-2.5 (3) MG/3ML IN SOLN
3.0000 mL | RESPIRATORY_TRACT | Status: DC
  Administered 2018-10-14 – 2018-10-25 (×68): 3 mL via RESPIRATORY_TRACT
  Filled 2018-10-14 (×66): qty 3

## 2018-10-14 MED ORDER — SENNOSIDES 8.8 MG/5ML PO SYRP
5.0000 mL | ORAL_SOLUTION | Freq: Two times a day (BID) | ORAL | Status: DC | PRN
  Administered 2018-10-19 – 2018-10-20 (×2): 5 mL
  Filled 2018-10-14 (×2): qty 5

## 2018-10-14 MED ORDER — ASPIRIN 81 MG PO CHEW
324.0000 mg | CHEWABLE_TABLET | ORAL | Status: AC
  Administered 2018-10-14: 324 mg via ORAL
  Filled 2018-10-14: qty 4

## 2018-10-14 MED ORDER — METHYLPREDNISOLONE SODIUM SUCC 125 MG IJ SOLR
125.0000 mg | Freq: Once | INTRAMUSCULAR | Status: AC
  Administered 2018-10-14: 125 mg via INTRAVENOUS
  Filled 2018-10-14: qty 2

## 2018-10-14 MED ORDER — ALBUTEROL (5 MG/ML) CONTINUOUS INHALATION SOLN
15.0000 mg/h | INHALATION_SOLUTION | RESPIRATORY_TRACT | Status: DC
  Filled 2018-10-14: qty 20

## 2018-10-14 MED ORDER — LACTATED RINGERS IV SOLN
INTRAVENOUS | Status: DC
  Administered 2018-10-14: 15:00:00 via INTRAVENOUS
  Administered 2018-10-15: 75 mL/h via INTRAVENOUS
  Administered 2018-10-15 – 2018-10-18 (×5): via INTRAVENOUS

## 2018-10-14 MED ORDER — CHLORHEXIDINE GLUCONATE 0.12% ORAL RINSE (MEDLINE KIT)
15.0000 mL | Freq: Two times a day (BID) | OROMUCOSAL | Status: DC
  Administered 2018-10-14 – 2018-10-24 (×20): 15 mL via OROMUCOSAL

## 2018-10-14 MED ORDER — ALBUTEROL (5 MG/ML) CONTINUOUS INHALATION SOLN
7.5000 mg/h | INHALATION_SOLUTION | RESPIRATORY_TRACT | Status: DC
  Administered 2018-10-14: 7.5 mg/h via RESPIRATORY_TRACT
  Filled 2018-10-14: qty 20

## 2018-10-14 MED ORDER — ROCURONIUM BROMIDE 50 MG/5ML IV SOLN
INTRAVENOUS | Status: DC | PRN
  Administered 2018-10-14: 75 mg via INTRAVENOUS

## 2018-10-14 NOTE — ED Triage Notes (Signed)
Pt arrives to ED from home with complaints of shortness of breath since last week when she was diagnosed with bronchitis. EMS reports pt has wheezing throughout, given 5mg  albuterol. HR 120, BP 130/86, RR 26, 96% on room air. Pt screaming upon arrival that she can't breathe, continuous moaning and yelling. Pt placed in position of comfort with bed locked and lowered, call bell in reach.

## 2018-10-14 NOTE — ED Notes (Signed)
Preparing to intubate. Mesner, MD bedside.

## 2018-10-14 NOTE — Progress Notes (Signed)
Initial Nutrition Assessment  DOCUMENTATION CODES:   Obesity unspecified  INTERVENTION:   -Vital High Protein @ 45 ml/hr (1080 ml)  via OGT  Provides: 1080 kcals (1769 kcal with current propofol rate), 95 grams protein, 903 ml free water. Meets 104% of calorie needs and 100% of protein needs.   NUTRITION DIAGNOSIS:   Inadequate oral intake related to inability to eat as evidenced by NPO status.  GOAL:   Patient will meet greater than or equal to 90% of their needs  MONITOR:   Vent status, Labs, Diet advancement, I & O's, TF tolerance, Weight trends  REASON FOR ASSESSMENT:   Consult Enteral/tube feeding initiation and management  ASSESSMENT:   Patient with PMH significant for asthma, bipolar 1 disorder, depression, cocaine abuse, MDD, and suicidal ideation. Presents this admission with asthmatic exacerbation and VCD with worsening hypoxia/encephalopathy (intuabated in ED).    Pt admitted to smoking cocaine on 10/15 which could be a contriubting factor to respiratory failure. No family at bedside to provide history. Weight noted to be stable over the last year per records. Height looks to be estimated at [facility]. Will use height from previous admission at [facility] on 03/24/17. RD consulted for tube feeding recommendations. Will adjust accordingly per propofol titration.   Patient is currently intubated on ventilator support MV: 11.6 L/min Temp (24hrs), Avg:98.3 F (36.8 C), Min:97.8 F (36.6 C), Max:98.6 F (37 C) BP: 134/81 MAP: 94 Propofol: 26.1 ml/hr- provides 689 kcal   No intake/output data recorded at this time.   Medications reviewed and include: fentanyl, solumedrol, propofol Labs reviewed: BUN 5 (L) calcium ionized 1.06 (L) corrected calcium 8.6 (L)   NUTRITION - FOCUSED PHYSICAL EXAM:    Most Recent Value  Orbital Region  No depletion  Upper Arm Region  No depletion  Thoracic and Lumbar Region  Unable to assess  Buccal Region  Unable to assess  Temple  Region  Mild depletion  Clavicle Bone Region  No depletion  Clavicle and Acromion Bone Region  No depletion  Scapular Bone Region  Unable to assess  Dorsal Hand  No depletion  Patellar Region  No depletion  Anterior Thigh Region  No depletion  Posterior Calf Region  No depletion  Edema (RD Assessment)  None     Diet Order:   Diet Order            Diet NPO time specified  Diet effective now              EDUCATION NEEDS:   Not appropriate for education at this time  Skin:  Skin Assessment: Reviewed RN Assessment  Last BM:  PTA  Height:   Ht Readings from Last 1 Encounters:  10/14/18 5\' 4"  (1.626 m)    Weight:   Wt Readings from Last 1 Encounters:  10/14/18 79 kg    Ideal Body Weight:  54.5 kg  BMI:  Body mass index is 29.9 kg/m.  Estimated Nutritional Needs:   Kcal:  1695 kcal  Protein:  75-90 grams   Fluid:  >/= 1.6 L/day   Mariana Single RD, LDN Clinical Nutrition Pager # - [phone]

## 2018-10-14 NOTE — Code Documentation (Signed)
ET tube placed, 21 at the teeth, color change present.

## 2018-10-14 NOTE — ED Notes (Signed)
Pt placed back on bipap

## 2018-10-14 NOTE — H&P (Signed)
NAME:  Deborah Melendez, MRN:  [id number], DOB:  08/11/1976, LOS: 0 ADMISSION DATE:  10/14/2018, CONSULTATION DATE:  10/18 REFERRING MD:  Dolly Rias, CHIEF COMPLAINT:  Acute hypoxic respiratory failure    Brief History   41 year old female admitted on 10/18 w/ working dx of asthmatic exacerbation, +/- VCD w/ worsening hypoxia and encephalopathy.   Past Medical History  Cocaine abuse, bipolar disease, depression, asthma   Significant Hospital Events   10/18 admitted w/working dx of asthmatic exacerbation and VCD   Consults: date of consult/date signed off & final recs:    Procedures (surgical and bedside):  CT angio 10/18>>>  Significant Diagnostic Tests:  UDS 10/18 (admitted to last smoking crack 10/15)>>>  Micro Data:  Sputum 10/18>>>   Antimicrobials:  azith 10/18>>>  Subjective:  Sedated on vent   Objective   Blood pressure (Abnormal) 133/95, pulse (Abnormal) 131, temperature 97.8 F (36.6 C), temperature source Axillary, resp. rate 13, height 5\' 2"  (1.575 m), weight 79 kg, SpO2 99 %.    Vent Mode: PRVC FiO2 (%):  [100 %] 100 % Set Rate:  [15 bmp] 15 bmp Vt Set:  [400 mL] 400 mL PEEP:  [5 cmH20] 5 cmH20 Plateau Pressure:  [26 cmH20] 26 cmH20  No intake or output data in the 24 hours ending 10/14/18 1236 Filed Weights   10/14/18 0930  Weight: 79 kg    Examination: General: chronically ill appearing 42 year old female. Now sedated on vent  HENT: NCAT orally intubated no JVD Lungs: diffuse wheeze, prolonged exhale phase, obstruction noted on flow/time loop Cardiovascular: tachy RRR  Abdomen: soft not tender + bowel sounds  Extremities: warm dry no edema brisk cr  Neuro: sedated  GU: clear yellow   Resolved Hospital Problem list     Assessment & Plan:  Acute hypoxic respiratory failure in setting of status asthmaticus.  -suspect that there is also a component of upper airway disease/VCD contributing given presenting loud audible wheezing from  bedside and heavy psychiatric history. Also consider crack cocaine as contributing factor and w/ recent hospitalization AND clear cxr need to r/o PE Plan Admit to ICU Full vent support allowing for permissive hypercarbia given airtrapping on vent Scheduled BDs q4 Systemic steroids Sputum culture  Empiric azith day 1 CT angio  PAD protocol RAS goal -3 VAP bundle  12 lead and CEs for completeness  Acute metabolic +/- toxic encephalopathy, h/o bipolar disease and depression. H/o polysubstance abuse including crack cocaine Presume hypoxia was the driving factor but has heavy h/o bipolar disease and depression Plan Cont home  Seroquel, neurontin and zoloft Propofol and fent RAS goal -3 for now Supportive care  F/u UDS Avoid bb w/ recent cocaine use   Anemia of chronic disease Plan  Trend cbc Transfuse for hgb < 7  Hyperglycemia -likely to be exacerbated by steroids Plan ssi   Disposition / Summary of Today's Plan 10/14/18   Admit to ICU allow for permissive hypercarbia  Treating as asthmatic exacerbation but need to r/o PE      Diet: tubefeeds Pain/Anxiety/Delirium protocol (if indicated): PAD protocol diprivan and fent RAS goal -3 VAP protocol (if indicated): ordered DVT prophylaxis: [place] heparin  GI prophylaxis: H2B Hyperglycemia protocol: ordered Mobility: advance per RN Code Status: full code Family Communication: pending   Labs   CBC: Recent Labs  Lab 10/14/18 0930 10/14/18 0952  WBC 10.2  --   NEUTROABS 7.2  --   HGB 9.9* 11.2*  HCT 32.2* 33.0*  MCV 88.2  --   PLT 370  --     Basic Metabolic Panel: Recent Labs  Lab 10/14/18 0940 10/14/18 0952  NA 140 141  K 3.6 3.6  CL 111 109  CO2 20*  --   GLUCOSE 131* 127*  BUN 5* 5*  CREATININE 0.83 0.80  CALCIUM 8.1*  --    GFR: Estimated Creatinine Clearance: 90.1 mL/min (by C-G formula based on SCr of 0.8 mg/dL). Recent Labs  Lab 10/14/18 0930 10/14/18 0940 10/14/18 1140  WBC 10.2  --   --     LATICACIDVEN  --  1.7 0.9    Liver Function Tests: Recent Labs  Lab 10/14/18 0940  AST 29  ALT 15  ALKPHOS 58  BILITOT 0.2*  PROT 7.0  ALBUMIN 3.4*   No results for input(s): LIPASE, AMYLASE in the last 168 hours. No results for input(s): AMMONIA in the last 168 hours.  ABG    Component Value Date/Time   HCO3 20.1 10/14/2018 0953   TCO2 21 (L) 10/14/2018 0953   ACIDBASEDEF 5.0 (H) 10/14/2018 0953   O2SAT 87.0 10/14/2018 0953     Coagulation Profile: No results for input(s): INR, PROTIME in the last 168 hours.  Cardiac Enzymes: No results for input(s): CKTOTAL, CKMB, CKMBINDEX, TROPONINI in the last 168 hours.  HbA1C: Hgb A1c MFr Bld  Date/Time Value Ref Range Status  08/25/2018 11:25 AM 5.3 4.8 - 5.6 % Final    Comment:    (NOTE) Pre diabetes:          5.7%-6.4% Diabetes:              >6.4% Glycemic control for   <7.0% adults with diabetes   10/09/2017 02:28 PM 5.6 4.8 - 5.6 % Final    Comment:    (NOTE) Pre diabetes:          5.7%-6.4% Diabetes:              >6.4% Glycemic control for   <7.0% adults with diabetes     CBG: No results for input(s): GLUCAP in the last 168 hours.  Admitting History of Present Illness.   43 year old aaf w/ sig h/o tobacco abuse, cocaine abuse, asthma, bipolar disease w/ severe depression and prior admits for suicidal ideation. Just discharged about 1 week prior after being admitted for acute bronchitis and asthmatic exacerbation. Was discharged to home in improved condition on pred taper.  Presents to the ED at [facility] on 10/18 w/ "several days" of worsening shortness of breath. Unfortunately was in acute distress on arrival w/ loud audible wheeze and became more anxious and encephalopathic, supplemental oxygen increased (sats as low as 70s) and BIPAP attempted for progressive desaturation. Would not tolerate BIPAP so intubated for worsening respiratory failure.   Review of Systems:   Unable d/t sedated on vent    Past Medical History  Deborah Melendez,  has a past medical history of Bipolar 1 disorder, depressed, severe ([place]) (02/04/2017), Cocaine abuse with cocaine-induced mood disorder ([place]) (07/27/2017), Depression, Homicidal ideations, Manic behavior ([place]), MDD (major depressive disorder), recurrent severe, without psychosis ([place]) (08/08/2015), Substance or medication-induced bipolar and related disorder with onset during intoxication ([place]) (09/08/2016), and Suicidal ideation.   Surgical History    Past Surgical History:  Procedure Laterality Date  . FINGER SURGERY       Social History   Social History   Socioeconomic History  . Marital status: Single    Spouse name: Not on file  .  Number of children: Not on file  . Years of education: Not on file  . Highest education level: Not on file  Occupational History  . Not on file  Social Needs  . Financial resource strain: Not on file  . Food insecurity:    Worry: Not on file    Inability: Not on file  . Transportation needs:    Medical: Not on file    Non-medical: Not on file  Tobacco Use  . Smoking status: Current Every Day Smoker    Packs/day: 1.00    Types: Cigarettes  . Smokeless tobacco: Never Used  . Tobacco comment: pt reported quiting three weeks ago  Substance and Sexual Activity  . Alcohol use: Yes    Alcohol/week: 10.0 standard drinks    Types: 10 Shots of liquor per week    Comment: once a week drinks a bottle of vodka  . Drug use: Yes    Frequency: 1.0 times per week    Types: Cocaine, Marijuana    Comment: last use 10/11/2018  . Sexual activity: Yes    Birth control/protection: None  Lifestyle  . Physical activity:    Days per week: Not on file    Minutes per session: Not on file  . Stress: Not on file  Relationships  . Social connections:    Talks on phone: Not on file    Gets together: Not on file    Attends religious service: Not on file    Active member of club or organization: Not on file    Attends meetings of  clubs or organizations: Not on file    Relationship status: Not on file  . Intimate partner violence:    Fear of current or ex partner: Not on file    Emotionally abused: Not on file    Physically abused: Not on file    Forced sexual activity: Not on file  Other Topics Concern  . Not on file  Social History Narrative  . Not on file  ,  reports that Deborah Melendez has been smoking cigarettes. Deborah Melendez has been smoking about 1.00 pack per day. Deborah Melendez has never used smokeless tobacco. Deborah Melendez reports that Deborah Melendez drinks about 10.0 standard drinks of alcohol per week. Deborah Melendez reports that Deborah Melendez has current or past drug history. Drugs: Cocaine and Marijuana. Frequency: 1.00 time per week.   Family History   Her family history includes Diabetes in her mother; Drug abuse in her father; Hypertension in her mother; Schizophrenia in her maternal aunt.   Allergies No Known Allergies   Home Medications  Prior to Admission medications   Medication Sig Start Date End Date Taking? Authorizing Provider  albuterol (PROVENTIL HFA;VENTOLIN HFA) 108 (90 Base) MCG/ACT inhaler Inhale 2 puffs into the lungs every 6 (six) hours as needed for wheezing or shortness of breath. 10/06/18  Yes Dorrell, Andree Elk, MD  Budesonide 90 MCG/ACT inhaler Inhale 1 puff into the lungs 2 (two) times daily. 10/06/18  Yes Dorrell, Andree Elk, MD  gabapentin (NEURONTIN) 600 MG tablet Take 1 tablet (600 mg total) by mouth 3 (three) times daily. 08/29/18 10/14/18 Yes McNew, Tyson Babinski, MD  QUEtiapine (SEROQUEL) 300 MG tablet Take 1 tablet (300 mg total) by mouth at bedtime. Patient taking differently: Take 300 mg by mouth at bedtime.  08/29/18  Yes McNew, Tyson Babinski, MD  sertraline (ZOLOFT) 100 MG tablet Take 1 tablet (100 mg total) by mouth daily. Patient taking differently: Take 50 mg by mouth daily.  08/30/18  Yes  McNew, Tyson Babinski, MD  traZODone (DESYREL) 50 MG tablet Take 1 tablet (50 mg total) by mouth at bedtime as needed for sleep. 08/29/18  Yes McNew, Tyson Babinski, MD   acetaminophen (TYLENOL) 325 MG tablet Take 2 tablets (650 mg total) by mouth every 6 (six) hours as needed for mild pain (or Fever >/= 101). 10/06/18   Dorrell, Andree Elk, MD  ferrous sulfate 325 (65 FE) MG tablet Take 1 tablet (325 mg total) by mouth 3 (three) times daily with meals. 08/29/18 10/04/18  McNew, Tyson Babinski, MD  hydrOXYzine (ATARAX/VISTARIL) 50 MG tablet Take 1 tablet (50 mg total) by mouth 3 (three) times daily as needed for anxiety. 08/29/18   McNew, Tyson Babinski, MD     Critical care time: 28 minutes Erick Colace ACNP-BC [place] Pager # [phone] OR # [phone] if no answer

## 2018-10-14 NOTE — ED Notes (Signed)
Pt refusing to wear Bipap, continues to take it off despite staff attempts. MD aware, pt 84% on room air. MD notifed, plan to intubate, respiratory aware and is bedside.

## 2018-10-14 NOTE — ED Notes (Signed)
ED Provider at bedside. 

## 2018-10-14 NOTE — ED Provider Notes (Signed)
Emergency Department Provider Note   I have reviewed the triage vital signs and the nursing notes.   HISTORY  Chief Complaint Shortness of Breath   HPI Deborah Melendez is a 42 y.o. female who presents the emergency department today with shortness of breath..  Patient states that she had a couple days of progressively worsening cough and shortness of breath and got worse today.  On review of the records appears that she was admitted to hospital for viral URI with bronchitis and elevated troponin and discharged over a week ago.  She states that she felt fine when she left the hospital.  I asked her when the last time she used any drugs where she says she inhaled crack cocaine 3 days ago.  She says she is been burning up but no measured fevers.  She does not use IV drug she claims.  No new rashes.  No other drugs. No other associated or modifying symptoms.    Past Medical History:  Diagnosis Date  . Bipolar 1 disorder, depressed, severe ([place]) 02/04/2017  . Cocaine abuse with cocaine-induced mood disorder ([place]) 07/27/2017  . Depression   . Homicidal ideations   . Manic behavior ([employer])   . MDD (major depressive disorder), recurrent severe, without psychosis ([place]) 08/08/2015  . 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 Suicidal ideation     Patient Active Problem List   Diagnosis Date Noted  . Bronchitis 10/05/2018  . Acute bronchitis with asthma with acute exacerbation 10/04/2018  . Major depressive disorder, recurrent episode with mixed features ([place]) 01/27/2018  . Tobacco use disorder 05/22/2017  . Suicidal ideation   . Cannabis use disorder, severe, dependence ([place]) 01/20/2017  . Cocaine use disorder, severe, dependence ([place]) 08/08/2015    Past Surgical History:  Procedure Laterality Date  . FINGER SURGERY      Current Outpatient Rx  . Order #: [id number] Class: Normal  . Order #: [id number] Class:  Normal  . Order #: [id number] Class: Normal  . Order #: [id number] Class: Print  . Order #: [id number] Class: Print  . Order #: [id number] Class: Print  . Order #: [id number] Class: Normal  . Order #: [id number] Class: Print  . Order #: [id number] Class: Print  . Order #: [id number] Class: Print    Allergies Patient has no known allergies.  Family History  Problem Relation Age of Onset  . Diabetes Mother   . Hypertension Mother   . Drug abuse Father   . Schizophrenia Maternal Aunt     Social History Social History   Tobacco Use  . Smoking status: Current Every Day Smoker    Packs/day: 1.00    Types: Cigarettes  . Smokeless tobacco: Never Used  . Tobacco comment: pt reported quiting three weeks ago  Substance Use Topics  . Alcohol use: Yes    Alcohol/week: 10.0 standard drinks    Types: 10 Shots of liquor per week    Comment: once a week drinks a bottle of vodka  . Drug use: Yes    Frequency: 1.0 times per week    Types: Cocaine, Marijuana    Comment: last use 10/11/2018    Review of Systems  All other systems negative except as documented in the HPI. All pertinent positives and negatives as reviewed in the HPI. ____________________________________________   PHYSICAL EXAM:  VITAL SIGNS: ED Triage Vitals  Enc Vitals Group     BP 10/14/18 0927 130/89     Pulse Rate 10/14/18 0927 (!) 122  Resp 10/14/18 0927 (!) 30     Temp 10/14/18 0927 97.8 F (36.6 C)     Temp Source 10/14/18 0927 Axillary     SpO2 10/14/18 0927 93 %     Weight 10/14/18 0930 174 lb 2.6 oz (79 kg)     Height 10/14/18 0930 5\' 2"  (1.575 m)    Constitutional: Alert and oriented. Ill appearing, respiratory distress. Anxious. Eyes: Conjunctivae are normal. PERRL. EOMI. Head: Atraumatic. Nose: No congestion/rhinnorhea. Mouth/Throat: Mucous membranes are moist.  Oropharynx non-erythematous. Neck: No stridor.  No meningeal signs.   Cardiovascular: tachycardic rate, regular rhythm. Good peripheral  circulation. Grossly normal heart sounds.   Respiratory: tachypneic, Distressed respiratory effort.  No retractions. Lungs mild crackles bilaterally but not giving good effort. Gastrointestinal: Soft and nontender. No distention.  Musculoskeletal: No lower extremity tenderness nor edema. No gross deformities of extremities. Neurologic:  Normal speech and language. No gross focal neurologic deficits are appreciated.  Skin:  Skin is warm, diaphoretic and intact. No rash noted.   ____________________________________________   LABS (all labs ordered are listed, but only abnormal results are displayed)  Labs Reviewed  COMPREHENSIVE METABOLIC PANEL - Abnormal; Notable for the following components:      Result Value   CO2 20 (*)    Glucose, Bld 131 (*)    BUN 5 (*)    Calcium 8.1 (*)    Albumin 3.4 (*)    Total Bilirubin 0.2 (*)    All other components within normal limits  RAPID URINE DRUG SCREEN, HOSP PERFORMED - Abnormal; Notable for the following components:   Cocaine POSITIVE (*)    Benzodiazepines POSITIVE (*)    Tetrahydrocannabinol POSITIVE (*)    All other components within normal limits  CBC WITH DIFFERENTIAL/PLATELET - Abnormal; Notable for the following components:   RBC 3.65 (*)    Hemoglobin 9.9 (*)    HCT 32.2 (*)    RDW 21.0 (*)    All other components within normal limits  BASIC METABOLIC PANEL - Abnormal; Notable for the following components:   CO2 18 (*)    Glucose, Bld 206 (*)    Calcium 8.0 (*)    All other components within normal limits  CBC - Abnormal; Notable for the following components:   WBC 16.9 (*)    RBC 3.21 (*)    Hemoglobin 8.4 (*)    HCT 29.6 (*)    MCHC 28.4 (*)    RDW 21.9 (*)    All other components within normal limits  BASIC METABOLIC PANEL - Abnormal; Notable for the following components:   CO2 21 (*)    Glucose, Bld 192 (*)    Creatinine, Ser 1.55 (*)    Calcium 8.0 (*)    GFR calc non Af Amer 41 (*)    GFR calc Af Amer 47 (*)      All other components within normal limits  BLOOD GAS, ARTERIAL - Abnormal; Notable for the following components:   pH, Arterial 7.109 (*)    pCO2 arterial 73.0 (*)    Acid-base deficit 6.1 (*)    All other components within normal limits  MAGNESIUM - Abnormal; Notable for the following components:   Magnesium 2.6 (*)    All other components within normal limits  PHOSPHORUS - Abnormal; Notable for the following components:   Phosphorus 5.2 (*)    All other components within normal limits  GLUCOSE, CAPILLARY - Abnormal; Notable for the following components:   Glucose-Capillary 139 (*)  All other components within normal limits  GLUCOSE, CAPILLARY - Abnormal; Notable for the following components:   Glucose-Capillary 195 (*)    All other components within normal limits  GLUCOSE, CAPILLARY - Abnormal; Notable for the following components:   Glucose-Capillary 164 (*)    All other components within normal limits  GLUCOSE, CAPILLARY - Abnormal; Notable for the following components:   Glucose-Capillary 169 (*)    All other components within normal limits  I-STAT CHEM 8, ED - Abnormal; Notable for the following components:   BUN 5 (*)    Glucose, Bld 127 (*)    Calcium, Ion 1.06 (*)    TCO2 20 (*)    Hemoglobin 11.2 (*)    HCT 33.0 (*)    All other components within normal limits  I-STAT VENOUS BLOOD GAS, ED - Abnormal; Notable for the following components:   pCO2, Ven 36.6 (*)    pO2, Ven 55.0 (*)    TCO2 21 (*)    Acid-base deficit 5.0 (*)    All other components within normal limits  POCT I-STAT 3, ART BLOOD GAS (G3+) - Abnormal; Notable for the following components:   pH, Arterial 7.204 (*)    pCO2 arterial 63.5 (*)    pO2, Arterial 452.0 (*)    Acid-base deficit 4.0 (*)    All other components within normal limits  POCT I-STAT 3, ART BLOOD GAS (G3+) - Abnormal; Notable for the following components:   pH, Arterial 7.169 (*)    pCO2 arterial 60.3 (*)    Acid-base deficit  7.0 (*)    All other components within normal limits  MRSA PCR SCREENING  CULTURE, RESPIRATORY  LACTIC ACID, PLASMA  LACTIC ACID, PLASMA  BRAIN NATRIURETIC PEPTIDE  TRIGLYCERIDES  PROCALCITONIN  MAGNESIUM  PHOSPHORUS  PROCALCITONIN  CBC WITH DIFFERENTIAL/PLATELET  HIV ANTIBODY (ROUTINE TESTING W REFLEX)  MAGNESIUM  PHOSPHORUS  BLOOD GAS, ARTERIAL  I-STAT TROPONIN, ED   ____________________________________________  EKG   EKG Interpretation  Date/Time:    Ventricular Rate:    PR Interval:    QRS Duration:   QT Interval:    QTC Calculation:   R Axis:     Text Interpretation:         ____________________________________________  RADIOLOGY  Ct Angio Chest Pe W Or Wo Contrast  Result Date: 10/14/2018 CLINICAL DATA:  Shortness of breath.  Wheezing. EXAM: CT ANGIOGRAPHY CHEST WITH CONTRAST TECHNIQUE: Multidetector CT imaging of the chest was performed using the standard protocol during bolus administration of intravenous contrast. Multiplanar CT image reconstructions and MIPs were obtained to evaluate the vascular anatomy. CONTRAST:  11mL ISOVUE-370 IOPAMIDOL (ISOVUE-370) INJECTION 76% COMPARISON:  None. FINDINGS: Cardiovascular: Satisfactory opacification of the pulmonary arteries to the segmental level. No evidence of pulmonary embolism. Normal heart size. No pericardial effusion. Mediastinum/Nodes: No enlarged mediastinal, hilar, or axillary lymph nodes. Thyroid gland, trachea, and esophagus demonstrate no significant findings. Endotracheal tube 3.2 cm above the carina. Lungs/Pleura: Right middle lobe collapse. Bandlike area of airspace disease at bilateral lung bases likely reflecting atelectasis. No pleural effusion or pneumothorax. Mild bronchial wall thickening as can be seen with bronchitis. Upper Abdomen: No acute abnormality. Nasogastric tube with the tip in the stomach. Musculoskeletal: No acute osseous abnormality. No aggressive osseous lesion. Review of the MIP  images confirms the above findings. IMPRESSION: 1. No evidence pulmonary embolus. 2. Right middle lobe collapse. No endobronchial obstructing lesion is identified. 3. Mild bronchial wall thickening as can be seen with bronchitis. 4. Mild  bibasilar atelectasis. 5. Endotracheal tube in satisfactory position. Nasogastric tube in satisfactory position. Electronically Signed   By: Kathreen Devoid   On: 10/14/2018 14:04   Dg Chest Port 1 View  Result Date: 10/15/2018 CLINICAL DATA:  42 year old with acute respiratory failure. EXAM: PORTABLE CHEST 1 VIEW COMPARISON:  10/14/2018 and CT from 10/14/2018 FINDINGS: Endotracheal tube is 4.5 cm above the carina. Nasogastric tube extends into the abdomen. Negative for pneumothorax. Lungs are well aerated. Recent chest CT demonstrated collapse or volume loss in the right middle lobe which is not well appreciated on this AP view. Heart and mediastinum are within normal limits. IMPRESSION: 1. Endotracheal tube is appropriately positioned. 2. Stable appearance of the lungs. Electronically Signed   By: Markus Daft M.D.   On: 10/15/2018 08:49   Dg Chest Portable 1 View  Result Date: 10/14/2018 CLINICAL DATA:  Check endotracheal tube placement EXAM: PORTABLE CHEST 1 VIEW COMPARISON:  Film from earlier in the same day. FINDINGS: Cardiac shadows within normal limits. Endotracheal tube and nasogastric catheter are now noted in satisfactory position. The lungs are well aerated bilaterally. No focal infiltrate or sizable effusion is seen. No bony abnormality is noted. IMPRESSION: Status post intubation and nasogastric catheter placement with satisfactory position. No other change from the prior exam is noted. Electronically Signed   By: Inez Catalina M.D.   On: 10/14/2018 12:26   Dg Chest Portable 1 View  Result Date: 10/14/2018 CLINICAL DATA:  Shortness of breath, bronchitis EXAM: PORTABLE CHEST 1 VIEW COMPARISON:  10/04/2018 FINDINGS: The heart size and mediastinal contours are  within normal limits. Both lungs are clear. The visualized skeletal structures are unremarkable. Normal heart size and vascularity. Trachea is midline. Azygos fissure noted in the right upper lobe. IMPRESSION: No active disease. Electronically Signed   By: Jerilynn Mages.  Shick M.D.   On: 10/14/2018 09:53    ____________________________________________   PROCEDURES  Procedure(s) performed:   .Critical Care Performed by: Merrily Pew, MD Authorized by: Merrily Pew, MD   Critical care provider statement:    Critical care time (minutes):  93   Critical care was necessary to treat or prevent imminent or life-threatening deterioration of the following conditions:  CNS failure or compromise, dehydration and respiratory failure   Critical care was time spent personally by me on the following activities:  Discussions with consultants, evaluation of patient's response to treatment, examination of patient, ordering and performing treatments and interventions, ordering and review of laboratory studies, ordering and review of radiographic studies, pulse oximetry, re-evaluation of patient's condition, obtaining history from patient or surrogate and review of old charts Procedure Name: Intubation Date/Time: 10/15/2018 9:49 AM Performed by: Merrily Pew, MD Pre-anesthesia Checklist: Patient identified, Patient being monitored, Emergency Drugs available, Timeout performed and Suction available Oxygen Delivery Method: Non-rebreather mask Preoxygenation: Pre-oxygenation with 100% oxygen Induction Type: Rapid sequence Ventilation: Mask ventilation without difficulty Laryngoscope Size: Glidescope and 4 Grade View: Grade I Tube size: 7.5 mm Number of attempts: 1 Airway Equipment and Method: Rigid stylet Placement Confirmation: ETT inserted through vocal cords under direct vision,  CO2 detector and Breath sounds checked- equal and bilateral Secured at: 23 cm Tube secured with: ETT holder Dental Injury: Teeth and  Oropharynx as per pre-operative assessment  Future Recommendations: Recommend- induction with short-acting agent, and alternative techniques readily available        ____________________________________________   INITIAL IMPRESSION / ASSESSMENT AND PLAN / ED COURSE  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  Given this history she will need to be evaluated for pulmonary edema, arts, pneumonia, bronchitis.  Also consider pulmonary embolus with her tachycardia and tachypnea and history of drug use and relatively clear lungs.  We will also treat with some Ativan and start on BiPAP for respiratory distress. Patient not tolerating bipap 2/2 altered mental status. Oxygen dropping to low 80's and high 70's without supplemental oxygen. Ativan not working. Still not cooperating with exam. Pt intubated for respiratory failure and likely hypoxic delirium. After intubation had high peak pressures and significantly diminished breath sounds concerning for significant bronchoconstriction. Steroids/CAT initiated. Discussed with ICU who will admit.        Pertinent labs & imaging results that were available during my care of the patient were reviewed by me and considered in my medical decision making (see chart for details).  ____________________________________________  FINAL CLINICAL IMPRESSION(S) / ED DIAGNOSES  Final diagnoses:  COPD exacerbation ([place])  Dyspnea, unspecified type  Altered mental status, unspecified altered mental status type  Acute on chronic respiratory failure with hypoxia ([place])     MEDICATIONS GIVEN DURING THIS VISIT:  Medications  LORazepam (ATIVAN) injection 1 mg (has no administration in time range)     NEW OUTPATIENT MEDICATIONS STARTED DURING THIS VISIT:  New Prescriptions   No medications on file    Note:  This note was prepared with assistance of Dragon voice recognition software.  Occasional wrong-word or sound-a-like substitutions may have occurred due to the inherent limitations of voice recognition software.   Merrily Pew, MD 10/15/18 [phone]

## 2018-10-14 NOTE — Progress Notes (Signed)
Attending - Critical Care Medicine Note  Patient Details:    Deborah Melendez is an 42 y.o. female. Intubated for respiratory distress.  Patient presented to ED complaining hysterically of dyspnea. Initially thought to be largely functional, but patient did have desaturations to 70's and patient continued to be tachypneic even when calm.   She was eventually intubated for hypoxia and bronchospasm.    Subjective:    Overnight Issues:  Patient is sedated and cannot provide additional history at this time.  Objective:  Vital signs, Hemodynamic parameters, Intake/Output and Vent settings reviewed.  Physical Exam:  Neuro: sedated on propofol and fentanyl with no response to pain. HEENT: OGT and ETT in place with no pressure ulceration. Respiratory: no ventilator asynchrony but patient remains tachypneic with elevated Ppeak 56 with Pplat 23. No PEEPi. Diffuse wheezing.  Cardiovascular: normotensive, tachycardic, HS normal with warm extremities. Abdomen: soft and not tender. Extremities: no edema.   Ancillary tests (personally reviewed):   Negative respiratory virus panel. ABG: mild respiratory acidosis. Mild leukocytosis. Toxicology positive for benzodiazepines and cocaine.  CT chest: no PE. RML collapse consistent with bronchitis.  Assessment/Plan:   Assessment  Critically ill due to acute hypoxic and hypercarbic respiratory failure requiring mechanical ventilation Acute asthma exacerbation with bronchitis on CT scan. Negative cultures and PCT High Peak-Plat difference consistent with obstruction. Agitation from cocaine use.  Plan:  Continue full ventilatory support with lung protective strategy. Tolerate mild hypercapnea Increase sedation to improve compliance with ventilator and limit tachypnea. Continuous nebulization and magnesium to break bronchospasm.  Monitor for hypokalemia, and bradycardia with magnesium infusion. Attempt SBT once respiratory rate and  wheezing improve.  Critical Care Total Time*: 30 Minutes  Yuma Blucher 10/14/2018  *Care during the described time interval was provided by me.  I have reviewed this patient's available data, including medical history, events of note, physical examination and test results as part of my evaluation.

## 2018-10-14 NOTE — ED Notes (Signed)
Pt states she smokes cocaine, states she has not had a bad lung reaction from it in the past.

## 2018-10-14 NOTE — ED Notes (Signed)
Pt restless, gagging and fighting ET tube. Propofol increased.

## 2018-10-15 ENCOUNTER — Inpatient Hospital Stay (HOSPITAL_COMMUNITY): Payer: Self-pay

## 2018-10-15 DIAGNOSIS — J9600 Acute respiratory failure, unspecified whether with hypoxia or hypercapnia: Secondary | ICD-10-CM

## 2018-10-15 LAB — BLOOD GAS, ARTERIAL
Acid-base deficit: 6.1 mmol/L — ABNORMAL HIGH (ref 0.0–2.0)
BICARBONATE: 22.1 mmol/L (ref 20.0–28.0)
Drawn by: 44166
FIO2: 0.4
LHR: 14 {breaths}/min
O2 Saturation: 93.2 %
PATIENT TEMPERATURE: 98.6
PCO2 ART: 73 mmHg — AB (ref 32.0–48.0)
PEEP: 5 cmH2O
VT: 400 mL
pH, Arterial: 7.109 — CL (ref 7.350–7.450)
pO2, Arterial: 84.3 mmHg (ref 83.0–108.0)

## 2018-10-15 LAB — PROCALCITONIN
PROCALCITONIN: 0.25 ng/mL
PROCALCITONIN: 0.11 ng/mL

## 2018-10-15 LAB — POCT I-STAT 3, ART BLOOD GAS (G3+)
ACID-BASE DEFICIT: 6 mmol/L — AB (ref 0.0–2.0)
Acid-base deficit: 7 mmol/L — ABNORMAL HIGH (ref 0.0–2.0)
Acid-base deficit: 3 mmol/L — ABNORMAL HIGH (ref 0.0–2.0)
Acid-base deficit: 3 mmol/L — ABNORMAL HIGH (ref 0.0–2.0)
BICARBONATE: 22.3 mmol/L (ref 20.0–28.0)
Bicarbonate: 22.5 mmol/L (ref 20.0–28.0)
Bicarbonate: 22.7 mmol/L (ref 20.0–28.0)
Bicarbonate: 24 mmol/L (ref 20.0–28.0)
O2 Saturation: 96 %
O2 Saturation: 90 %
O2 Saturation: 97 %
O2 Saturation: 95 %
PCO2 ART: 60.3 mmHg — AB (ref 32.0–48.0)
PCO2 ART: 47.5 mmHg (ref 32.0–48.0)
PO2 ART: 103 mmHg (ref 83.0–108.0)
PO2 ART: 82 mmHg — AB (ref 83.0–108.0)
Patient temperature: 36
TCO2: 24 mmol/L (ref 22–32)
TCO2: 24 mmol/L (ref 22–32)
TCO2: 24 mmol/L (ref 22–32)
TCO2: 25 mmol/L (ref 22–32)
pCO2 arterial: 56.5 mmHg — ABNORMAL HIGH (ref 32.0–48.0)
pCO2 arterial: 41.8 mmHg (ref 32.0–48.0)
pH, Arterial: 7.169 — CL (ref 7.350–7.450)
pH, Arterial: 7.203 — ABNORMAL LOW (ref 7.350–7.450)
pH, Arterial: 7.339 — ABNORMAL LOW (ref 7.350–7.450)
pH, Arterial: 7.307 — ABNORMAL LOW (ref 7.350–7.450)
pO2, Arterial: 68 mmHg — ABNORMAL LOW (ref 83.0–108.0)
pO2, Arterial: 98 mmHg (ref 83.0–108.0)

## 2018-10-15 LAB — CBC
HCT: 29.6 % — ABNORMAL LOW (ref 36.0–46.0)
Hemoglobin: 8.4 g/dL — ABNORMAL LOW (ref 12.0–15.0)
MCH: 26.2 pg (ref 26.0–34.0)
MCHC: 28.4 g/dL — AB (ref 30.0–36.0)
MCV: 92.2 fL (ref 80.0–100.0)
NRBC: 0 % (ref 0.0–0.2)
Platelets: 322 10*3/uL (ref 150–400)
RBC: 3.21 MIL/uL — ABNORMAL LOW (ref 3.87–5.11)
RDW: 21.9 % — ABNORMAL HIGH (ref 11.5–15.5)
WBC: 16.9 10*3/uL — ABNORMAL HIGH (ref 4.0–10.5)

## 2018-10-15 LAB — GLUCOSE, CAPILLARY
GLUCOSE-CAPILLARY: 169 mg/dL — AB (ref 70–99)
GLUCOSE-CAPILLARY: 172 mg/dL — AB (ref 70–99)
GLUCOSE-CAPILLARY: 161 mg/dL — AB (ref 70–99)
GLUCOSE-CAPILLARY: 126 mg/dL — AB (ref 70–99)
GLUCOSE-CAPILLARY: 134 mg/dL — AB (ref 70–99)
Glucose-Capillary: 164 mg/dL — ABNORMAL HIGH (ref 70–99)

## 2018-10-15 LAB — BASIC METABOLIC PANEL
Anion gap: 10 (ref 5–15)
BUN: 13 mg/dL (ref 6–20)
CHLORIDE: 105 mmol/L (ref 98–111)
CO2: 21 mmol/L — ABNORMAL LOW (ref 22–32)
Calcium: 8 mg/dL — ABNORMAL LOW (ref 8.9–10.3)
Creatinine, Ser: 1.55 mg/dL — ABNORMAL HIGH (ref 0.44–1.00)
GFR, EST AFRICAN AMERICAN: 47 mL/min — AB (ref >60)
GFR, EST NON AFRICAN AMERICAN: 41 mL/min — AB (ref >60)
Glucose, Bld: 192 mg/dL — ABNORMAL HIGH (ref 70–99)
Potassium: 4.4 mmol/L (ref 3.5–5.1)
SODIUM: 136 mmol/L (ref 135–145)

## 2018-10-15 LAB — PHOSPHORUS
Phosphorus: 5.2 mg/dL — ABNORMAL HIGH (ref 2.5–4.6)
Phosphorus: 4.6 mg/dL (ref 2.5–4.6)

## 2018-10-15 LAB — HIV ANTIBODY (ROUTINE TESTING W REFLEX): HIV Screen 4th Generation wRfx: NONREACTIVE

## 2018-10-15 LAB — MAGNESIUM
MAGNESIUM: 2.7 mg/dL — AB (ref 1.7–2.4)
Magnesium: 2.6 mg/dL — ABNORMAL HIGH (ref 1.7–2.4)

## 2018-10-15 MED ORDER — MIDAZOLAM HCL 2 MG/2ML IJ SOLN
INTRAMUSCULAR | Status: AC
  Administered 2018-10-15: 2 mg
  Filled 2018-10-15: qty 2

## 2018-10-15 MED ORDER — ALBUMIN HUMAN 5 % IV SOLN
INTRAVENOUS | Status: AC
  Filled 2018-10-15: qty 250

## 2018-10-15 MED ORDER — LACTATED RINGERS IV BOLUS
1000.0000 mL | Freq: Once | INTRAVENOUS | Status: AC
  Administered 2018-10-15: 1000 mL via INTRAVENOUS

## 2018-10-15 MED ORDER — ALBUMIN HUMAN 5 % IV SOLN
25.0000 g | Freq: Once | INTRAVENOUS | Status: AC
  Administered 2018-10-15: 25 g via INTRAVENOUS
  Filled 2018-10-15: qty 250

## 2018-10-15 MED ORDER — SODIUM CHLORIDE 0.9 % IV SOLN: INTRAVENOUS | Status: DC | PRN

## 2018-10-15 MED ORDER — CISATRACURIUM BOLUS VIA INFUSION
0.1000 mg/kg | Freq: Once | INTRAVENOUS | Status: AC
  Administered 2018-10-15: 7.9 mg via INTRAVENOUS
  Filled 2018-10-15 (×2): qty 8

## 2018-10-15 MED ORDER — SODIUM CHLORIDE 0.9 % IV SOLN
0.5000 ug/kg/min | INTRAVENOUS | Status: DC
  Filled 2018-10-15: qty 20

## 2018-10-15 MED ORDER — MIDAZOLAM HCL 2 MG/2ML IJ SOLN: 2.0000 mg | Freq: Once | INTRAMUSCULAR | Status: AC

## 2018-10-15 NOTE — Progress Notes (Signed)
eLink Physician-Brief Progress Note Patient Name: Deborah Melendez DOB: Nov 26, 1976 MRN: [id number]   Date of Service  10/15/2018  HPI/Events of Note  Respiratory acidosis  eICU Interventions  Increase respiratory rate to 24, ABG in 45 minutes, insert arterial line for blood gas sampling access         [name] U Jaxen Samples 10/15/2018, 4:26 AM

## 2018-10-15 NOTE — Progress Notes (Signed)
NAME:  Deborah Melendez, MRN:  [id number], DOB:  1976-04-24, LOS: 1 ADMISSION DATE:  10/14/2018, CONSULTATION DATE:  10/18 REFERRING MD:  Dolly Rias, CHIEF COMPLAINT:  Acute hypoxic respiratory failure    Brief History   42 year old female admitted on 10/18 w/ working dx of asthmatic exacerbation w/ worsening hypoxia and encephalopathy. Intubated  Past Medical History  Cocaine abuse, bipolar disease, depression, asthma   Significant Hospital Events   10/18 admitted w/working dx of asthmatic exacerbation in setting cocaine use   Consults: date of consult/date signed off & final recs:    Procedures (surgical and bedside):   Significant Diagnostic Tests:  CT angio 10/14/2018- no pulmonary embolus, right middle lobe collapse, bronchial wall thickening, bronchitis, mild basal atelectasis.  I have reviewed the images personally.  Urine drug screen 10/14/2018-cocaine, benzos  Micro Data:  Sputum 10/18>>> Respiratory virus panel 10/8 > negative  Antimicrobials:  Azithro 10/18>>>  Subjective:  Remains sedated, unresponsive Vent adjusted for hypercarbia 1 dose of paralytics for vent dyssynchrony.  Objective   Blood pressure 100/68, pulse (!) 105, temperature (!) 97 F (36.1 C), resp. rate (!) 30, height 5\' 4"  (1.626 m), weight 76.3 kg, SpO2 100 %.    Vent Mode: PRVC FiO2 (%):  [40 %-100 %] 40 % Set Rate:  [14 bmp-30 bmp] 30 bmp Vt Set:  [400 mL-500 mL] 500 mL PEEP:  [5 cmH20] 5 cmH20 Plateau Pressure:  [25 cmH20-32 cmH20] 28 cmH20   Intake/Output Summary (Last 24 hours) at 10/15/2018 1057 Last data filed at 10/15/2018 0600 Gross per 24 hour  Intake 4064.08 ml  Output 810 ml  Net 3254.08 ml   Filed Weights   10/14/18 0930 10/15/18 0500  Weight: 79 kg 76.3 kg    Examination: Gen:      No acute distress, chronically ill-appearing HEENT:  EOMI, sclera anicteric ET tube Neck:     No masses; no thyromegaly Lungs:    Clear to auscultation bilaterally; normal  respiratory effort CV:         Regular rate and rhythm; no murmurs Abd:      + bowel sounds; soft, non-tender; no palpable masses, no distension Ext:    No edema; adequate peripheral perfusion Skin:      Warm and dry; no rash Neuro: Sedated, unresponsive  Resolved Hospital Problem list     Assessment & Plan:  Acute hypoxic respiratory failure in setting of status asthmaticus.  -suspect that there is also a component of upper airway disease/VCD contributing given presenting loud audible wheezing from bedside and heavy psychiatric history. Also consider crack cocaine as contributing factor  Plan Continue vent support Reduce respiratory rate to 20 and I time yo 0.6 as she is air trapping Repeat ABG in 1 to 2 hours. Continue bronchodilators, systemic steroids Empiric azithromycin Follow sputum cultures.  Check procalcitonin.  Acute metabolic +/- toxic encephalopathy, h/o bipolar disease and depression. H/o polysubstance abuse including crack cocaine Presume hypoxia was the driving factor but has heavy h/o bipolar disease and depression Plan Cont home  Seroquel, neurontin and zoloft Propofol and fent RAS goal -3 for now Intermittent paralytics for dyssynchrony.  Anemia of chronic disease Plan  Trend cbc Transfuse for hgb < 7  Hyperglycemia -likely to be exacerbated by steroids Plan Psych coverage  Disposition / Summary of Today's Plan 10/15/18   Permissive hypercarbia, monitor trapping Continue steroids, bronchodilators.    Diet: tubefeeds Pain/Anxiety/Delirium protocol (if indicated): PAD protocol diprivan and fent RAS goal -3 VAP protocol (  if indicated): ordered DVT prophylaxis: [place] heparin  GI prophylaxis: H2B Hyperglycemia protocol: ordered Mobility: advance per RN Code Status: full code Family Communication: pending   Labs   CBC: Recent Labs  Lab 10/14/18 0930 10/14/18 0952 10/15/18 0228  WBC 10.2  --  16.9*  NEUTROABS 7.2  --   --   HGB 9.9* 11.2* 8.4*    HCT 32.2* 33.0* 29.6*  MCV 88.2  --  92.2  PLT 370  --  672    Basic Metabolic Panel: Recent Labs  Lab 10/14/18 0940 10/14/18 0952 10/14/18 1616 10/14/18 1933 10/15/18 0228  NA 140 141  --  135 136  K 3.6 3.6  --  3.7 4.4  CL 111 109  --  107 105  CO2 20*  --   --  18* 21*  GLUCOSE 131* 127*  --  206* 192*  BUN 5* 5*  --  6 13  CREATININE 0.83 0.80  --  0.92 1.55*  CALCIUM 8.1*  --   --  8.0* 8.0*  MG  --   --  2.0  --  2.6*  PHOS  --   --  2.8  --  5.2*   GFR: Estimated Creatinine Clearance: 47.7 mL/min (A) (by C-G formula based on SCr of 1.55 mg/dL (H)). Recent Labs  Lab 10/14/18 0930 10/14/18 0940 10/14/18 1140 10/14/18 1608 10/15/18 0228  PROCALCITON  --   --   --  <0.10 0.25  WBC 10.2  --   --   --  16.9*  LATICACIDVEN  --  1.7 0.9  --   --     Liver Function Tests: Recent Labs  Lab 10/14/18 0940  AST 29  ALT 15  ALKPHOS 58  BILITOT 0.2*  PROT 7.0  ALBUMIN 3.4*   No results for input(s): LIPASE, AMYLASE in the last 168 hours. No results for input(s): AMMONIA in the last 168 hours.  ABG    Component Value Date/Time   PHART 7.339 (L) 10/15/2018 0902   PCO2ART 41.8 10/15/2018 0902   PO2ART 98.0 10/15/2018 0902   HCO3 22.7 10/15/2018 0902   TCO2 24 10/15/2018 0902   ACIDBASEDEF 3.0 (H) 10/15/2018 0902   O2SAT 97.0 10/15/2018 0902     Coagulation Profile: No results for input(s): INR, PROTIME in the last 168 hours.  Cardiac Enzymes: No results for input(s): CKTOTAL, CKMB, CKMBINDEX, TROPONINI in the last 168 hours.  HbA1C: Hgb A1c MFr Bld  Date/Time Value Ref Range Status  08/25/2018 11:25 AM 5.3 4.8 - 5.6 % Final    Comment:    (NOTE) Pre diabetes:          5.7%-6.4% Diabetes:              >6.4% Glycemic control for   <7.0% adults with diabetes   10/09/2017 02:28 PM 5.6 4.8 - 5.6 % Final    Comment:    (NOTE) Pre diabetes:          5.7%-6.4% Diabetes:              >6.4% Glycemic control for   <7.0% adults with diabetes      CBG: Recent Labs  Lab 10/14/18 1723 10/14/18 2334 10/15/18 0350 10/15/18 0741 10/15/18 0818  GLUCAP 139* 195* 164* 172* 169*   The patient is critically ill with multiple organ system failure and requires high complexity decision making for assessment and support, frequent evaluation and titration of therapies, advanced monitoring, review of radiographic studies and interpretation of  complex data.   Critical Care Time devoted to patient care services, exclusive of separately billable procedures, described in this note is 35 minutes.   Marshell Garfinkel MD [facility] Pager [phone] If no answer or after 3pm call: [phone] 10/15/2018, 10:57 AM

## 2018-10-15 NOTE — Progress Notes (Signed)
eLink Physician-Brief Progress Note Patient Name: Deborah Melendez DOB: Apr 27, 1976 MRN: [id number]   Date of Service  10/15/2018  HPI/Events of Note  Tachycardia and low urine output  eICU Interventions  Lactated Ringers 1000 ml iv fluid bolus x 1        Calloway Andrus U Leianne Callins 10/15/2018, 12:52 AM

## 2018-10-15 NOTE — Progress Notes (Signed)
eLink Physician-Brief Progress Note Patient Name: Deborah Melendez DOB: 1976/02/18 MRN: [id number]   Date of Service  10/15/2018  HPI/Events of Note  Ventricular dyssynchrony Sinus tachycardia despite crystalloid fluid bolus x 1  eICU Interventions  Versed 2 mg iv followed by Nimbex 0.1 mg/ kg iv x 1 5 % Albumin 500 ml iv x 1        [name] U Shaila Gilchrest 10/15/2018, 2:19 AM

## 2018-10-15 NOTE — Progress Notes (Signed)
Per MD Ogan wants the Tidal Volume at 527ml. Refer to ABG results. Report given to unit RRT.

## 2018-10-15 NOTE — Procedures (Signed)
Arterial Catheter Insertion Procedure Note Deborah Melendez [id number] 1976-09-29  Procedure: Insertion of Arterial Catheter  Indications: Blood pressure monitoring  Procedure Details Consent: Risks of procedure as well as the alternatives and risks of each were explained to the (patient/caregiver).  Consent for procedure obtained. Time Out: Verified patient identification, verified procedure, site/side was marked, verified correct patient position, special equipment/implants available, medications/allergies/relevent history reviewed, required imaging and test results available.  Performed  Maximum sterile technique was used including antiseptics, cap, gloves, gown, hand hygiene and mask. Skin prep: Chlorhexidine; local anesthetic administered 20 gauge catheter was inserted into left radial artery using the Seldinger technique. ULTRASOUND GUIDANCE USED: NO Evaluation Blood flow good; BP tracing good. Complications: No apparent complications.  Patient left arterial line insertion was placed w/o complications. Patient tolerated procedure well. Art line has a good waveform and has good blood return.   Leigh Aurora, B.S, RRT, RCP 10/15/2018

## 2018-10-15 NOTE — Progress Notes (Signed)
HR increased to 130's and UOP only 38mL per hour. Paged [place] and obtained orders.

## 2018-10-16 ENCOUNTER — Inpatient Hospital Stay (HOSPITAL_COMMUNITY): Payer: Self-pay

## 2018-10-16 LAB — BASIC METABOLIC PANEL
Anion gap: 6 (ref 5–15)
BUN: 18 mg/dL (ref 6–20)
CALCIUM: 8.2 mg/dL — AB (ref 8.9–10.3)
CO2: 25 mmol/L (ref 22–32)
CREATININE: 0.85 mg/dL (ref 0.44–1.00)
Chloride: 107 mmol/L (ref 98–111)
Glucose, Bld: 138 mg/dL — ABNORMAL HIGH (ref 70–99)
Potassium: 4.4 mmol/L (ref 3.5–5.1)
Sodium: 138 mmol/L (ref 135–145)

## 2018-10-16 LAB — HERPES SIMPLEX VIRUS(HSV) DNA BY PCR
HSV 1 DNA: NEGATIVE
HSV 2 DNA: NEGATIVE

## 2018-10-16 LAB — CBC
HEMATOCRIT: 25.3 % — AB (ref 36.0–46.0)
Hemoglobin: 7.4 g/dL — ABNORMAL LOW (ref 12.0–15.0)
MCH: 26.2 pg (ref 26.0–34.0)
MCHC: 29.2 g/dL — AB (ref 30.0–36.0)
MCV: 89.7 fL (ref 80.0–100.0)
PLATELETS: 313 10*3/uL (ref 150–400)
RBC: 2.82 MIL/uL — ABNORMAL LOW (ref 3.87–5.11)
RDW: 22 % — AB (ref 11.5–15.5)
WBC: 20.9 10*3/uL — AB (ref 4.0–10.5)
nRBC: 0 % (ref 0.0–0.2)

## 2018-10-16 LAB — GLUCOSE, CAPILLARY
GLUCOSE-CAPILLARY: 105 mg/dL — AB (ref 70–99)
GLUCOSE-CAPILLARY: 159 mg/dL — AB (ref 70–99)
Glucose-Capillary: 143 mg/dL — ABNORMAL HIGH (ref 70–99)
Glucose-Capillary: 117 mg/dL — ABNORMAL HIGH (ref 70–99)
Glucose-Capillary: 126 mg/dL — ABNORMAL HIGH (ref 70–99)
Glucose-Capillary: 171 mg/dL — ABNORMAL HIGH (ref 70–99)

## 2018-10-16 LAB — MAGNESIUM: MAGNESIUM: 2.6 mg/dL — AB (ref 1.7–2.4)

## 2018-10-16 LAB — PROCALCITONIN

## 2018-10-16 LAB — PHOSPHORUS: PHOSPHORUS: 3.2 mg/dL (ref 2.5–4.6)

## 2018-10-16 MED ORDER — DEXMEDETOMIDINE HCL IN NACL 400 MCG/100ML IV SOLN
0.4000 ug/kg/h | INTRAVENOUS | Status: DC
  Administered 2018-10-16: 1.8 ug/kg/h via INTRAVENOUS
  Administered 2018-10-16: 0.6 ug/kg/h via INTRAVENOUS
  Administered 2018-10-16: 1.8 ug/kg/h via INTRAVENOUS
  Administered 2018-10-16: 1.2 ug/kg/h via INTRAVENOUS
  Administered 2018-10-16 – 2018-10-17 (×4): 1.8 ug/kg/h via INTRAVENOUS
  Filled 2018-10-16 (×8): qty 100

## 2018-10-16 NOTE — Progress Notes (Signed)
Rectal temp confirmed bladder temp of 95.48F. Marland Kitchen  Warm blankets applied and bair hugger applied.

## 2018-10-16 NOTE — Progress Notes (Signed)
NAME:  Deborah Melendez, MRN:  [id number], DOB:  29-Jan-1976, LOS: 2 ADMISSION DATE:  10/14/2018, CONSULTATION DATE:  10/18 REFERRING MD:  Dolly Rias, CHIEF COMPLAINT:  Acute hypoxic respiratory failure    Brief History   42 year old female admitted on 10/18 w/ working dx of asthmatic exacerbation w/ worsening hypoxia and encephalopathy. Intubated  Past Medical History  Cocaine abuse, bipolar disease, depression, asthma   Significant Hospital Events   10/18 admitted, intubated  Consults: date of consult/date signed off & final recs:    Procedures (surgical and bedside):  ETT 10/18 >>  Significant Diagnostic Tests:  CT angio 10/14/2018- no pulmonary embolus, right middle lobe collapse, bronchial wall thickening, bronchitis, mild basal atelectasis.   Chest x-ray 10/16/2018- endotracheal tube in stable position.  Linear atelectasis in the right.  I reviewed the images personally.  Urine drug screen 10/14/2018-cocaine, benzos  Micro Data:  Sputum 10/18>>> Respiratory virus panel 10/8 > negative  Antimicrobials:  Azithro 10/18>>>  Subjective:  Has periods of intermittent agitation requiring increased sedation. Unresponsive on vent.  Objective   Blood pressure 117/72, pulse 99, temperature (!) 95.5 F (35.3 C), temperature source Rectal, resp. rate 20, height 5\' 4"  (1.626 m), weight 79.3 kg, SpO2 100 %.    Vent Mode: PRVC FiO2 (%):  [40 %] 40 % Set Rate:  [20 bmp] 20 bmp Vt Set:  [500 mL] 500 mL PEEP:  [5 cmH20] 5 cmH20 Plateau Pressure:  [23 cmH20-25 cmH20] 25 cmH20   Intake/Output Summary (Last 24 hours) at 10/16/2018 1030 Last data filed at 10/16/2018 0800 Gross per 24 hour  Intake 3198 ml  Output 2195 ml  Net 1003 ml   Filed Weights   10/14/18 0930 10/15/18 0500 10/16/18 0334  Weight: 79 kg 76.3 kg 79.3 kg    Examination: Gen:      No acute distress HEENT:  EOMI, sclera anicteric, ETT Neck:     No masses; no thyromegaly Lungs:    Clear to  auscultation bilaterally; normal respiratory effort CV:         Regular rate and rhythm; no murmurs Abd:      + bowel sounds; soft, non-tender; no palpable masses, no distension Ext:    No edema; adequate peripheral perfusion Skin:      Warm and dry; no rash Neuro: Sedated, unresponsive  Resolved Hospital Problem list     Assessment & Plan:  Acute hypoxic respiratory failure in setting of status asthmaticus.  -suspect that there is also a component of upper airway disease/VCD contributing given presenting loud audible wheezing from bedside and heavy psychiatric history. Also consider crack cocaine as contributing factor  Plan Continue vent support Follow chest x-ray, ABG. Continue bronchodilators, systemic steroids Empiric azithromycin Follow sputum cultures.   Acute metabolic +/- toxic encephalopathy, h/o bipolar disease and depression. H/o polysubstance abuse including crack cocaine Presume hypoxia was the driving factor but has heavy h/o bipolar disease and depression Plan Cont home  Seroquel, neurontin and zoloft Propofol and fent gtt Add precedex  Anemia of chronic disease Plan  Trend cbc Transfuse for hgb < 7  Hyperglycemia -likely to be exacerbated by steroids Plan SSI coverage  Disposition / Summary of Today's Plan 10/16/18   Continue vent support, steroids, bronchodilators    Diet: tubefeeds Pain/Anxiety/Delirium protocol (if indicated): PAD protocol diprivan and fent RAS goal -3 VAP protocol (if indicated): ordered DVT prophylaxis: [place] heparin  GI prophylaxis: H2B Hyperglycemia protocol: ordered Mobility: advance per RN Code Status: full code Family  Communication: pending   Labs   CBC: Recent Labs  Lab 10/14/18 0930 10/14/18 0952 10/15/18 0228 10/16/18 0512  WBC 10.2  --  16.9* 20.9*  NEUTROABS 7.2  --   --   --   HGB 9.9* 11.2* 8.4* 7.4*  HCT 32.2* 33.0* 29.6* 25.3*  MCV 88.2  --  92.2 89.7  PLT 370  --  322 093    Basic Metabolic  Panel: Recent Labs  Lab 10/14/18 0940 10/14/18 0952 10/14/18 1616 10/14/18 1933 10/15/18 0228 10/15/18 1656 10/16/18 0512  NA 140 141  --  135 136  --  138  K 3.6 3.6  --  3.7 4.4  --  4.4  CL 111 109  --  107 105  --  107  CO2 20*  --   --  18* 21*  --  25  GLUCOSE 131* 127*  --  206* 192*  --  138*  BUN 5* 5*  --  6 13  --  18  CREATININE 0.83 0.80  --  0.92 1.55*  --  0.85  CALCIUM 8.1*  --   --  8.0* 8.0*  --  8.2*  MG  --   --  2.0  --  2.6* 2.7* 2.6*  PHOS  --   --  2.8  --  5.2* 4.6 3.2   GFR: Estimated Creatinine Clearance: 88.7 mL/min (by C-G formula based on SCr of 0.85 mg/dL). Recent Labs  Lab 10/14/18 0930 10/14/18 0940 10/14/18 1140 10/14/18 1608 10/15/18 0228 10/15/18 1150 10/16/18 0512  PROCALCITON  --   --   --  <0.10 0.25 0.11 <0.10  WBC 10.2  --   --   --  16.9*  --  20.9*  LATICACIDVEN  --  1.7 0.9  --   --   --   --     Liver Function Tests: Recent Labs  Lab 10/14/18 0940  AST 29  ALT 15  ALKPHOS 58  BILITOT 0.2*  PROT 7.0  ALBUMIN 3.4*   No results for input(s): LIPASE, AMYLASE in the last 168 hours. No results for input(s): AMMONIA in the last 168 hours.  ABG    Component Value Date/Time   PHART 7.307 (L) 10/15/2018 1209   PCO2ART 47.5 10/15/2018 1209   PO2ART 82.0 (L) 10/15/2018 1209   HCO3 24.0 10/15/2018 1209   TCO2 25 10/15/2018 1209   ACIDBASEDEF 3.0 (H) 10/15/2018 1209   O2SAT 95.0 10/15/2018 1209     Coagulation Profile: No results for input(s): INR, PROTIME in the last 168 hours.  Cardiac Enzymes: No results for input(s): CKTOTAL, CKMB, CKMBINDEX, TROPONINI in the last 168 hours.  HbA1C: Hgb A1c MFr Bld  Date/Time Value Ref Range Status  08/25/2018 11:25 AM 5.3 4.8 - 5.6 % Final    Comment:    (NOTE) Pre diabetes:          5.7%-6.4% Diabetes:              >6.4% Glycemic control for   <7.0% adults with diabetes   10/09/2017 02:28 PM 5.6 4.8 - 5.6 % Final    Comment:    (NOTE) Pre diabetes:           5.7%-6.4% Diabetes:              >6.4% Glycemic control for   <7.0% adults with diabetes     CBG: Recent Labs  Lab 10/15/18 1643 10/15/18 2022 10/15/18 2348 10/16/18 0405 10/16/18 0753  GLUCAP 126*  134* 159* 143* 117*   The patient is critically ill with multiple organ system failure and requires high complexity decision making for assessment and support, frequent evaluation and titration of therapies, advanced monitoring, review of radiographic studies and interpretation of complex data.   Critical Care Time devoted to patient care services, exclusive of separately billable procedures, described in this note is 35 minutes.   Marshell Garfinkel MD [facility] Pager [phone] If no answer or after 3pm call: [phone] 10/16/2018, 10:43 AM

## 2018-10-16 NOTE — Progress Notes (Signed)
Vent alarming.  Patient observed sitting up in bed reaching for ETT with safety mitts on bilateral hands.  Fentanyl bolus given per MAR.  Precedex at max dose of 1.20mcg/kg/hr.  Propofol restarted at 49mcg and Dr. Vaughan Browner paged for new orders.  Patient had been weaned off of Propofol.

## 2018-10-17 ENCOUNTER — Inpatient Hospital Stay (HOSPITAL_COMMUNITY): Payer: Self-pay

## 2018-10-17 ENCOUNTER — Inpatient Hospital Stay: Payer: Self-pay

## 2018-10-17 DIAGNOSIS — J4552 Severe persistent asthma with status asthmaticus: Secondary | ICD-10-CM

## 2018-10-17 DIAGNOSIS — J45901 Unspecified asthma with (acute) exacerbation: Secondary | ICD-10-CM

## 2018-10-17 LAB — POCT I-STAT 3, ART BLOOD GAS (G3+)
ACID-BASE EXCESS: 3 mmol/L — AB (ref 0.0–2.0)
ACID-BASE EXCESS: 3 mmol/L — AB (ref 0.0–2.0)
Acid-Base Excess: 4 mmol/L — ABNORMAL HIGH (ref 0.0–2.0)
BICARBONATE: 27.1 mmol/L (ref 20.0–28.0)
BICARBONATE: 29 mmol/L — AB (ref 20.0–28.0)
BICARBONATE: 30.5 mmol/L — AB (ref 20.0–28.0)
Bicarbonate: 32.1 mmol/L — ABNORMAL HIGH (ref 20.0–28.0)
O2 Saturation: 98 %
O2 Saturation: 98 %
O2 Saturation: 100 %
O2 Saturation: 99 %
PCO2 ART: 49.3 mmHg — AB (ref 32.0–48.0)
PCO2 ART: 74.6 mmHg — AB (ref 32.0–48.0)
PCO2 ART: 63.6 mmHg — AB (ref 32.0–48.0)
PH ART: 7.379 (ref 7.350–7.450)
PH ART: 7.244 — AB (ref 7.350–7.450)
PO2 ART: 123 mmHg — AB (ref 83.0–108.0)
PO2 ART: 118 mmHg — AB (ref 83.0–108.0)
PO2 ART: 258 mmHg — AB (ref 83.0–108.0)
PO2 ART: 148 mmHg — AB (ref 83.0–108.0)
Patient temperature: 98.8
Patient temperature: 99.5
Patient temperature: 98.7
TCO2: 29 mmol/L (ref 22–32)
TCO2: 30 mmol/L (ref 22–32)
TCO2: 34 mmol/L — ABNORMAL HIGH (ref 22–32)
TCO2: 32 mmol/L (ref 22–32)
pCO2 arterial: 59.8 mmHg — ABNORMAL HIGH (ref 32.0–48.0)
pH, Arterial: 7.265 — ABNORMAL LOW (ref 7.350–7.450)
pH, Arterial: 7.289 — ABNORMAL LOW (ref 7.350–7.450)

## 2018-10-17 LAB — GLUCOSE, CAPILLARY
GLUCOSE-CAPILLARY: 140 mg/dL — AB (ref 70–99)
GLUCOSE-CAPILLARY: 140 mg/dL — AB (ref 70–99)
GLUCOSE-CAPILLARY: 181 mg/dL — AB (ref 70–99)
Glucose-Capillary: 137 mg/dL — ABNORMAL HIGH (ref 70–99)
Glucose-Capillary: 132 mg/dL — ABNORMAL HIGH (ref 70–99)
Glucose-Capillary: 111 mg/dL — ABNORMAL HIGH (ref 70–99)
Glucose-Capillary: 123 mg/dL — ABNORMAL HIGH (ref 70–99)
Glucose-Capillary: 136 mg/dL — ABNORMAL HIGH (ref 70–99)
Glucose-Capillary: 146 mg/dL — ABNORMAL HIGH (ref 70–99)

## 2018-10-17 LAB — TRIGLYCERIDES: Triglycerides: 72 mg/dL (ref <150)

## 2018-10-17 LAB — PROCALCITONIN: Procalcitonin: <0.1 ng/mL

## 2018-10-17 MED ORDER — SUCCINYLCHOLINE CHLORIDE 20 MG/ML IJ SOLN
100.0000 mg | Freq: Once | INTRAMUSCULAR | Status: AC
  Administered 2018-10-17: 100 mg via INTRAVENOUS

## 2018-10-17 MED ORDER — FENTANYL CITRATE (PF) 100 MCG/2ML IJ SOLN
INTRAMUSCULAR | Status: AC
  Filled 2018-10-17: qty 2

## 2018-10-17 MED ORDER — FENTANYL CITRATE (PF) 100 MCG/2ML IJ SOLN: 100.0000 ug | Freq: Once | INTRAMUSCULAR | Status: DC | PRN

## 2018-10-17 MED ORDER — FENTANYL CITRATE (PF) 100 MCG/2ML IJ SOLN: 100.0000 ug | Freq: Once | INTRAMUSCULAR | Status: DC

## 2018-10-17 MED ORDER — GABAPENTIN 250 MG/5ML PO SOLN
600.0000 mg | Freq: Three times a day (TID) | ORAL | Status: DC
  Administered 2018-10-17 – 2018-10-19 (×6): 600 mg via ORAL
  Filled 2018-10-17 (×7): qty 12

## 2018-10-17 MED ORDER — ETOMIDATE 2 MG/ML IV SOLN
25.0000 mg | Freq: Once | INTRAVENOUS | Status: AC
  Administered 2018-10-17: 25 mg via INTRAVENOUS

## 2018-10-17 MED ORDER — ETOMIDATE 2 MG/ML IV SOLN
20.0000 mg | Freq: Once | INTRAVENOUS | Status: AC
  Administered 2018-10-17: 20 mg via INTRAVENOUS

## 2018-10-17 MED ORDER — METHYLPREDNISOLONE SODIUM SUCC 125 MG IJ SOLR
80.0000 mg | Freq: Four times a day (QID) | INTRAMUSCULAR | Status: DC
  Administered 2018-10-17 – 2018-10-24 (×30): 80 mg via INTRAVENOUS
  Filled 2018-10-17 (×30): qty 2

## 2018-10-17 MED ORDER — MIDAZOLAM HCL 2 MG/2ML IJ SOLN
INTRAMUSCULAR | Status: AC
  Administered 2018-10-17: 2 mg
  Filled 2018-10-17: qty 2

## 2018-10-17 MED ORDER — MIDAZOLAM HCL 2 MG/2ML IJ SOLN
2.0000 mg | Freq: Once | INTRAMUSCULAR | Status: AC
  Administered 2018-10-17: 2 mg via INTRAVENOUS

## 2018-10-17 MED ORDER — MIDAZOLAM HCL 2 MG/2ML IJ SOLN: 2.0000 mg | Freq: Once | INTRAMUSCULAR | Status: DC | PRN

## 2018-10-17 MED ORDER — MIDAZOLAM HCL 2 MG/2ML IJ SOLN
INTRAMUSCULAR | Status: AC
  Filled 2018-10-17: qty 2

## 2018-10-17 MED ORDER — CISATRACURIUM BOLUS VIA INFUSION
0.0500 mg/kg | Freq: Once | INTRAVENOUS | Status: AC
  Administered 2018-10-17: 4.1 mg via INTRAVENOUS
  Filled 2018-10-17: qty 5

## 2018-10-17 MED ORDER — FENTANYL CITRATE (PF) 100 MCG/2ML IJ SOLN
100.0000 ug | Freq: Once | INTRAMUSCULAR | Status: AC
  Administered 2018-10-17: 100 ug via INTRAVENOUS

## 2018-10-17 MED ORDER — SODIUM CHLORIDE 0.9 % IV SOLN: INTRAVENOUS | Status: DC | PRN

## 2018-10-17 MED ORDER — SODIUM CHLORIDE 0.9 % IV SOLN
3.0000 ug/kg/min | INTRAVENOUS | Status: DC
  Administered 2018-10-17: 3 ug/kg/min via INTRAVENOUS
  Filled 2018-10-17: qty 20

## 2018-10-17 MED ORDER — ARFORMOTEROL TARTRATE 15 MCG/2ML IN NEBU
15.0000 ug | INHALATION_SOLUTION | Freq: Two times a day (BID) | RESPIRATORY_TRACT | Status: DC
  Administered 2018-10-17 – 2018-10-19 (×5): 15 ug via RESPIRATORY_TRACT
  Filled 2018-10-17 (×6): qty 2

## 2018-10-17 MED ORDER — MONTELUKAST SODIUM 10 MG PO TABS
10.0000 mg | ORAL_TABLET | Freq: Every day | ORAL | Status: DC
  Administered 2018-10-17 – 2018-10-23 (×7): 10 mg
  Filled 2018-10-17 (×8): qty 1

## 2018-10-17 MED ORDER — MIDAZOLAM BOLUS VIA INFUSION
1.0000 mg | INTRAVENOUS | Status: DC | PRN
  Filled 2018-10-17: qty 2

## 2018-10-17 MED ORDER — MIDAZOLAM HCL 2 MG/2ML IJ SOLN: 1.0000 mg | INTRAMUSCULAR | Status: DC | PRN

## 2018-10-17 MED ORDER — FAMOTIDINE 40 MG/5ML PO SUSR
20.0000 mg | Freq: Two times a day (BID) | ORAL | Status: DC
  Administered 2018-10-17 – 2018-10-22 (×11): 20 mg via ORAL
  Filled 2018-10-17 (×12): qty 2.5

## 2018-10-17 MED ORDER — ROCURONIUM BROMIDE 50 MG/5ML IV SOLN
50.0000 mg | Freq: Once | INTRAVENOUS | Status: AC
  Administered 2018-10-17: 50 mg via INTRAVENOUS

## 2018-10-17 MED ORDER — MIDAZOLAM HCL 2 MG/2ML IJ SOLN
INTRAMUSCULAR | Status: AC
  Administered 2018-10-17: 2 mg via INTRAVENOUS
  Filled 2018-10-17: qty 2

## 2018-10-17 MED ORDER — SODIUM CHLORIDE 0.9 % IV SOLN
0.0000 mg/h | INTRAVENOUS | Status: DC
  Administered 2018-10-17 – 2018-10-18 (×3): 10 mg/h via INTRAVENOUS
  Administered 2018-10-18: 6 mg/h via INTRAVENOUS
  Administered 2018-10-18 (×2): 10 mg/h via INTRAVENOUS
  Administered 2018-10-19: 5 mg/h via INTRAVENOUS
  Filled 2018-10-17 (×7): qty 10

## 2018-10-17 MED ORDER — MAGNESIUM SULFATE 2 GM/50ML IV SOLN
2.0000 g | Freq: Once | INTRAVENOUS | Status: AC
  Administered 2018-10-17: 2 g via INTRAVENOUS
  Filled 2018-10-17: qty 50

## 2018-10-17 MED ORDER — FENTANYL 2500MCG IN NS 250ML (10MCG/ML) PREMIX INFUSION
0.0000 ug/h | INTRAVENOUS | Status: DC
  Administered 2018-10-17 – 2018-10-22 (×14): 300 ug/h via INTRAVENOUS
  Administered 2018-10-22 – 2018-10-23 (×3): 200 ug/h via INTRAVENOUS
  Administered 2018-10-24: 300 ug/h via INTRAVENOUS
  Administered 2018-10-24: 150 ug/h via INTRAVENOUS
  Filled 2018-10-17 (×19): qty 250

## 2018-10-17 MED ORDER — VITAL HIGH PROTEIN PO LIQD
1000.0000 mL | ORAL | Status: DC
  Administered 2018-10-17 – 2018-10-18 (×3): 1000 mL
  Filled 2018-10-17: qty 1000

## 2018-10-17 MED ORDER — LORAZEPAM 2 MG/ML PO CONC: 1.0000 mg | Freq: Two times a day (BID) | ORAL | Status: DC

## 2018-10-17 MED ORDER — ARTIFICIAL TEARS OPHTHALMIC OINT
1.0000 "application " | TOPICAL_OINTMENT | Freq: Three times a day (TID) | OPHTHALMIC | Status: DC
  Administered 2018-10-17 – 2018-10-18 (×3): 1 via OPHTHALMIC
  Filled 2018-10-17: qty 3.5

## 2018-10-17 MED ORDER — SODIUM CHLORIDE 0.9 % IV SOLN
0.0000 mg/h | INTRAVENOUS | Status: DC
  Administered 2018-10-17: 2 mg/h via INTRAVENOUS
  Filled 2018-10-17: qty 10

## 2018-10-17 MED ORDER — LORAZEPAM 1 MG PO TABS
1.0000 mg | ORAL_TABLET | Freq: Two times a day (BID) | ORAL | Status: DC
  Administered 2018-10-17 – 2018-10-22 (×12): 1 mg via ORAL
  Filled 2018-10-17 (×13): qty 1

## 2018-10-17 MED ORDER — MIDAZOLAM HCL 2 MG/2ML IJ SOLN: 2.0000 mg | Freq: Once | INTRAMUSCULAR | Status: DC

## 2018-10-17 MED ORDER — MIDAZOLAM BOLUS VIA INFUSION
2.0000 mg | INTRAVENOUS | Status: DC | PRN
  Filled 2018-10-17: qty 2

## 2018-10-17 MED ORDER — FENTANYL BOLUS VIA INFUSION
50.0000 ug | INTRAVENOUS | Status: DC | PRN
  Administered 2018-10-19 – 2018-10-23 (×12): 50 ug via INTRAVENOUS
  Filled 2018-10-17: qty 50

## 2018-10-17 NOTE — Progress Notes (Signed)
RT called to room by RN as patient had self-extubated. Upon arrival RT found patient flailing in the bed and stating she could not breathe. RT gathered all supplies and patient was reintubated by ICU MD. Vitals are stable. RT will continue to monitor.

## 2018-10-17 NOTE — Progress Notes (Signed)
ELink notified because patient thrashing in bed, trying to get up and trying to get tube out. Order for 2mg  Versed received and given and to go ahead and start Propofol gtt back along with Fentanyl and Precedex. A-line not reading, RT made aware to come check A-line, no visible issue noted by RN.

## 2018-10-17 NOTE — Progress Notes (Signed)
Patient self extubated around 2am. Patient was bagged manually ventilated per RRT awaiting MD arrival. Patient was successfully re-intubated per MD Hammonds w/o complications. Positive color change, BBS noted per MD. Patient placed on the vent on the same settings.

## 2018-10-17 NOTE — Progress Notes (Signed)
Re-evaluated train-of-fours, no twitch detected.  Paused Nimbex, recheck in 15 minutes.

## 2018-10-17 NOTE — Progress Notes (Signed)
While following up on patient, I notice that she had an ABG performed at 03:20 showing pH 7.26 / pCO2 59 / PaO2 123 / HCO2 27. I had not been called about these results. Will increase RR from 20 to 26 on vent and repeat ABG in 1 hour at 09:00

## 2018-10-17 NOTE — Progress Notes (Signed)
42 year old with asthma and cocaine abuse admitted 10/18 with status asthmaticus requiring mechanical ventilation. CT angiogram from 10/18 was personally reviewed which showed mild bibasilar atelectasis and right middle lobe collapse. She had severe bronchospasm and was being treated with steroids and bronchodilators.  She self extubated last night and again this morning and on my arrival was an extremis with prolonged expiration, not fully awake with accessory muscle usage and was reintubated. Bilateral severe rhonchi and wheezing, S1-S2 tacky, no edema, no JVD, soft and nontender abdomen.  Chest x-ray personally reviewed which shows ET tube in position and no new infiltrates. Ventilator waveforms reviewed. ABG post intubation shows acute respiratory acidosis and improved hypoxia.  Impression/plan Status asthmaticus/acute respiratory failure-we will keep respiratory rate at 20 with seems to minimize auto PEEP and give the longest expiration.  Will permit some degree of acute respiratory acidosis. We will maintain deep sedation with propofol and fentanyl and add Versed if needed to maintain RA SS -5. She continued to be asynchronous and required paralysis-goal for paralytic will be vent synchrony  Cocaine abuse-noted again this admission , no history of  vaping and no signs of lung injury.  Family updated. My critical care time independent of procedures x 74m  Kara Mead MD. FCCP. [facility] Pager [phone] If no response call [phone]   10/17/2018

## 2018-10-17 NOTE — Procedures (Signed)
Endotracheal Intubation Procedure Note Indication for endotracheal intubation: impending respiratory failure Sedation: etomidate and midazolam Paralytic: succinylcholine Equipment: Macintosh 3 laryngoscope blade and 7.50mm cuffed endotracheal tube; secured 24cm at the lip Cricoid Pressure: no Number of attempts: 3 ETT location confirmed by by auscultation, by CXR and ETCO2 monitor.

## 2018-10-17 NOTE — Plan of Care (Signed)
  Problem: Clinical Measurements: Goal: Ability to maintain clinical measurements within normal limits will improve Outcome: Progressing Goal: Will remain free from infection Outcome: Progressing Goal: Respiratory complications will improve Outcome: Progressing Goal: Cardiovascular complication will be avoided Outcome: Progressing   Problem: Elimination: Goal: Will not experience complications related to urinary retention Outcome: Progressing   Problem: Safety: Goal: Ability to remain free from injury will improve Outcome: Progressing   Problem: Health Behavior/Discharge Planning: Goal: Ability to manage health-related needs will improve Outcome: Not Progressing

## 2018-10-17 NOTE — Progress Notes (Signed)
Pt's mother, Barbaraann Share, called. Call back number [phone].Would like to be called by provider tomorrow 10/22 with update. Eddie Dibbles, NP notified and stated pt's daughter, Caryl Pina, spoke with him earlier today. Both Barbaraann Share and [place] plan to visit pt at bedside tomorrow. Barbaraann Share is from [place] and not in the best of health. Caryl Pina is 42yo from [place].

## 2018-10-17 NOTE — Progress Notes (Signed)
NAME:  Deborah Melendez, MRN:  [id number], DOB:  [birth year], LOS: 3 ADMISSION DATE:  10/14/2018, CONSULTATION DATE:  10/18 REFERRING MD:  Dolly Rias, CHIEF COMPLAINT:  Acute hypoxic respiratory failure    Brief History   42 year old female admitted on 10/18 w/ working dx of asthmatic exacerbation w/ worsening hypoxia and encephalopathy. Intubated  Past Medical History  Cocaine abuse, bipolar disease, depression, asthma   Significant Hospital Events   10/18 admitted, intubated 10/21 self extubated, re-intubated. ABG worse. Ketamine infusion started.  Consults: date of consult/date signed off & final recs:    Procedures (surgical and bedside):  ETT 10/18 >>  Significant Diagnostic Tests:  CT angio 10/14/2018- no pulmonary embolus, right middle lobe collapse, bronchial wall thickening, bronchitis, mild basal atelectasis.   Chest x-ray 10/16/2018- endotracheal tube in stable position.  Linear atelectasis in the right.  I reviewed the images personally.  Urine drug screen 10/14/2018-cocaine, benzos  Micro Data:  Sputum 10/18>>> Respiratory virus panel 10/8 > negative  Antimicrobials:  Azithro 10/18>>>  Subjective:  Self-extubated overnight and required re-intubation. Subsequent ABG worse.   Objective   Blood pressure (!) 127/98, pulse 94, temperature 99.1 F (37.3 C), temperature source Core, resp. rate (!) 26, height 5\' 4"  (1.626 m), weight 82.4 kg, SpO2 100 %.    Vent Mode: PRVC FiO2 (%):  [40 %] 40 % Set Rate:  [20 bmp-26 bmp] 26 bmp Vt Set:  [500 mL] 500 mL PEEP:  [5 cmH20] 5 cmH20 Plateau Pressure:  [26 cmH20-31 cmH20] 27 cmH20   Intake/Output Summary (Last 24 hours) at 10/17/2018 0831 Last data filed at 10/17/2018 0800 Gross per 24 hour  Intake 5304.38 ml  Output 1580 ml  Net 3724.38 ml   Filed Weights   10/15/18 0500 10/16/18 0334 10/17/18 0313  Weight: 76.3 kg 79.3 kg 82.4 kg    Examination: General:  Middle aged female on vent Neuro:  Sedated  RASS -4 HEENT:  [place]/AT, No JVD noted, PERRL Cardiovascular:  RRR, no MRG Lungs: Wheeze throughout Abdomen:  Soft, non-distended, non-tender Musculoskeletal:  No acute deformity, no edema.  Skin:  Intact, MMM   Resolved Hospital Problem list     Assessment & Plan:  Acute hypoxic respiratory failure in setting of status asthmaticus.  -suspect that there is also a component of upper airway disease/VCD contributing given presenting loud audible wheezing from bedside and heavy psychiatric history. Also consider crack cocaine as contributing factor. UDS was positive for this.   Plan Continue vent support (rate increased from 20 - 26 overnight) Follow ABG. Continue bronchodilators, systemic steroids increased Add ketamine infusion for bronchospasm.  Empiric azithromycin Follow sputum cultures. If repeat ABG not improved, may need to consider neuromuscular blockade.  VAP bundle   Acute metabolic +/- toxic encephalopathy, h/o bipolar disease and depression. H/o polysubstance abuse including crack cocaine: Presume hypoxia was the driving factor but has heavy h/o bipolar disease and depression. Plan Cont home Seroquel, neurontin and zoloft Propofol, fentanyl, and precedex. RASS goal - 3.   Anemia of chronic disease Plan  Trend cbc Transfuse for hgb < 7  Hyperglycemia -likely to be exacerbated by steroids Plan SSI coverage  Leukocytosis: with some question of aspiration. CXR remains clear. Afebrile. Has been on steroids for a few days.  - Azithromycin as above. Monitor.   Disposition / Summary of Today's Plan 10/17/18       Diet: tubefeeds Pain/Anxiety/Delirium protocol (if indicated): PAD protocol diprivan, fentanyl, and precedex. RASS goal -3 VAP protocol (if  indicated): yes DVT prophylaxis: [place] heparin  GI prophylaxis: H2B Hyperglycemia protocol: ordered Mobility: advance per RN Code Status: full code Family Communication: pending   Labs   CBC: Recent Labs  Lab  10/14/18 0930 10/14/18 0952 10/15/18 0228 10/16/18 0512  WBC 10.2  --  16.9* 20.9*  NEUTROABS 7.2  --   --   --   HGB 9.9* 11.2* 8.4* 7.4*  HCT 32.2* 33.0* 29.6* 25.3*  MCV 88.2  --  92.2 89.7  PLT 370  --  322 528    Basic Metabolic Panel: Recent Labs  Lab 10/14/18 0940 10/14/18 0952 10/14/18 1616 10/14/18 1933 10/15/18 0228 10/15/18 1656 10/16/18 0512  NA 140 141  --  135 136  --  138  K 3.6 3.6  --  3.7 4.4  --  4.4  CL 111 109  --  107 105  --  107  CO2 20*  --   --  18* 21*  --  25  GLUCOSE 131* 127*  --  206* 192*  --  138*  BUN 5* 5*  --  6 13  --  18  CREATININE 0.83 0.80  --  0.92 1.55*  --  0.85  CALCIUM 8.1*  --   --  8.0* 8.0*  --  8.2*  MG  --   --  2.0  --  2.6* 2.7* 2.6*  PHOS  --   --  2.8  --  5.2* 4.6 3.2   GFR: Estimated Creatinine Clearance: 90.5 mL/min (by C-G formula based on SCr of 0.85 mg/dL). Recent Labs  Lab 10/14/18 0930 10/14/18 0940 10/14/18 1140 10/14/18 1608 10/15/18 0228 10/15/18 1150 10/16/18 0512  PROCALCITON  --   --   --  <0.10 0.25 0.11 <0.10  WBC 10.2  --   --   --  16.9*  --  20.9*  LATICACIDVEN  --  1.7 0.9  --   --   --   --     Liver Function Tests: Recent Labs  Lab 10/14/18 0940  AST 29  ALT 15  ALKPHOS 58  BILITOT 0.2*  PROT 7.0  ALBUMIN 3.4*   No results for input(s): LIPASE, AMYLASE in the last 168 hours. No results for input(s): AMMONIA in the last 168 hours.  ABG    Component Value Date/Time   PHART 7.265 (L) 10/17/2018 0320   PCO2ART 59.8 (H) 10/17/2018 0320   PO2ART 123.0 (H) 10/17/2018 0320   HCO3 27.1 10/17/2018 0320   TCO2 29 10/17/2018 0320   ACIDBASEDEF 3.0 (H) 10/15/2018 1209   O2SAT 98.0 10/17/2018 0320     Coagulation Profile: No results for input(s): INR, PROTIME in the last 168 hours.  Cardiac Enzymes: No results for input(s): CKTOTAL, CKMB, CKMBINDEX, TROPONINI in the last 168 hours.  HbA1C: Hgb A1c MFr Bld  Date/Time Value Ref Range Status  08/25/2018 11:25 AM 5.3 4.8 -  5.6 % Final    Comment:    (NOTE) Pre diabetes:          5.7%-6.4% Diabetes:              >6.4% Glycemic control for   <7.0% adults with diabetes   10/09/2017 02:28 PM 5.6 4.8 - 5.6 % Final    Comment:    (NOTE) Pre diabetes:          5.7%-6.4% Diabetes:              >6.4% Glycemic control for   <7.0% adults with  diabetes     CBG: Recent Labs  Lab 10/16/18 1708 10/16/18 2013 10/16/18 2314 10/17/18 0333 10/17/18 0759  GLUCAP 126* 132* 171* 111* 123*   My critical care time excluding procedures: 35 minutes   Georgann Housekeeper, AGACNP-BC [place] Pager [phone] or [phone]  10/17/2018 8:36 AM

## 2018-10-17 NOTE — Procedures (Signed)
Arterial Catheter Insertion Procedure Note Ingra Rother [id number] [birth year]  Procedure: Insertion of Arterial Catheter  Indications: Frequent blood sampling  Procedure Details Consent: Unable to obtain consent because of altered level of consciousness. Time Out: Verified patient identification, verified procedure, site/side was marked, verified correct patient position, special equipment/implants available, medications/allergies/relevent history reviewed, required imaging and test results available.  Performed  Maximum sterile technique was used including antiseptics, cap, gloves, gown, hand hygiene and mask. Skin prep: Chlorhexidine; local anesthetic administered 20 gauge catheter was inserted into left radial artery using the Seldinger technique. ULTRASOUND GUIDANCE USED: NO Evaluation Blood flow good; BP tracing good. Complications: No apparent complications.   Leigh Aurora, B.S, RRT, RCP 10/17/2018

## 2018-10-17 NOTE — Progress Notes (Signed)
PCCM INTERVAL PROGRESS NOTE  Called back to bedside to evaluate patient for high peak pressures. Upon my arrival patient is dyssynchronous with peak pressures in the 50s. Air trapping. Despite optimized vent settings and max dose fentanyl/propofol/versed.  ABG    Component Value Date/Time   PHART 7.289 (L) 10/17/2018 1418   PCO2ART 63.6 (H) 10/17/2018 1418   PO2ART 148.0 (H) 10/17/2018 1418   HCO3 30.5 (H) 10/17/2018 1418   TCO2 32 10/17/2018 1418   ACIDBASEDEF 3.0 (H) 10/15/2018 1209   O2SAT 99.0 10/17/2018 1418      Plan: - Initiate neuromuscular blockade focused on vent synchrony  - Nimbex bolus + infusion.  Georgann Housekeeper, AGACNP-BC [place] Pager [phone] or [phone]  10/17/2018 3:05 PM

## 2018-10-17 NOTE — Progress Notes (Signed)
PCCM INTERVAL PROGRESS NOTE  Called to bedside in the event of self-extubation. Upon my evaluation 30 mins prior patient was deeply sedated. Did not wake up for ABG. Now self extubated and yelling " I can't breathe"  Emergently re-intubated by Dr. Elsworth Soho.   Will transition sedation to: Propofol, Fentanyl, and Versed. RASS goal -4 Reduce rate to 20 on vent.  ABG in 1 hour CXR  Georgann Housekeeper, AGACNP-BC [place] Pager [phone] or [phone]  10/17/2018 9:39 AM

## 2018-10-17 NOTE — Progress Notes (Signed)
Called emergently to bedside when patient self-extubated. On exam patient very somnolent not responding to voice or touch, not spontaneously moving extremities, diffuse tight wheezing b/l. Decision made to re-intubate. Tolerated intubation well. Did have emesis during intubation.   Regarding her Asthma exac, will increase solumedrol from 60mg  to 80mg  IV q6hrs, add Brovana nebs BID, add Singulair daily, give 2g Magnesium sulfate IV once. Continue Pulmicort nebs BID and Duonebs q4hrs. If wheezing continued despite this regimen, consider adding terbutaline or theophylline.

## 2018-10-17 NOTE — Progress Notes (Signed)
Pt self extubated with restraints and gtts running. RN heard pt's vent alarming and pt stated she could not breath. Bagged at bedside. Notified RT and CCM. Dr. Elsworth Soho reintubated. Will cont to monitor.

## 2018-10-17 NOTE — Progress Notes (Signed)
Patient self-extubated 0158, patient agitated and restless in bed, trying to talk, tachypneic and labored breathing. ELink and RT notified, began bagging patient as her respiratory rate and effort decreased. Dr.Hammonds at bedside, at 0216 2mg  Versed IV push given, 25mg  Etomidate IV given, and 100mg  Succinylcholine IV push given. Patient successfully re-intubated at 0219. OG tube re-inserted. X-rays ordered. Fentanyl and Precedex gtt restarted.

## 2018-10-17 NOTE — Procedures (Signed)
Intubation Procedure Note [name] [id number] 1976/02/06  Procedure: Intubation Indications: Respiratory insufficiency  Procedure Details Consent: Unable to obtain consent because of emergent medical necessity. Time Out: Verified patient identification, verified procedure, site/side was marked, verified correct patient position, special equipment/implants available, medications/allergies/relevent history reviewed, required imaging and test results available.  Performed  Maximum sterile technique was used including gloves, hand hygiene and mask.  MAC and 3 Self extubated & in extremis  Versed 2 fent 100 Etomidate 20 Rocuronium 50  Evaluation Hemodynamic Status: BP stable throughout; O2 sats: stable throughout Patient's Current Condition: stable Complications: No apparent complications Patient did tolerate procedure well. Chest X-ray ordered to verify placement.  CXR: pending.   [name] Soho 10/17/2018

## 2018-10-18 ENCOUNTER — Inpatient Hospital Stay (HOSPITAL_COMMUNITY): Payer: Self-pay

## 2018-10-18 DIAGNOSIS — J9621 Acute and chronic respiratory failure with hypoxia: Principal | ICD-10-CM

## 2018-10-18 DIAGNOSIS — R4182 Altered mental status, unspecified: Secondary | ICD-10-CM

## 2018-10-18 LAB — CBC
HCT: 22.6 % — ABNORMAL LOW (ref 36.0–46.0)
Hemoglobin: 6.9 g/dL — CL (ref 12.0–15.0)
MCH: 27.3 pg (ref 26.0–34.0)
MCHC: 30.5 g/dL (ref 30.0–36.0)
MCV: 89.3 fL (ref 80.0–100.0)
Platelets: 275 10*3/uL (ref 150–400)
RBC: 2.53 MIL/uL — AB (ref 3.87–5.11)
RDW: 22 % — ABNORMAL HIGH (ref 11.5–15.5)
WBC: 13.1 10*3/uL — ABNORMAL HIGH (ref 4.0–10.5)
nRBC: 0.2 % (ref 0.0–0.2)

## 2018-10-18 LAB — CULTURE, RESPIRATORY W GRAM STAIN

## 2018-10-18 LAB — MAGNESIUM: MAGNESIUM: 2.6 mg/dL — AB (ref 1.7–2.4)

## 2018-10-18 LAB — BASIC METABOLIC PANEL
Anion gap: 8 (ref 5–15)
BUN: 20 mg/dL (ref 6–20)
CHLORIDE: 104 mmol/L (ref 98–111)
CO2: 28 mmol/L (ref 22–32)
CREATININE: 0.76 mg/dL (ref 0.44–1.00)
Calcium: 8.2 mg/dL — ABNORMAL LOW (ref 8.9–10.3)
GFR calc Af Amer: >60 mL/min (ref >60)
GFR calc non Af Amer: >60 mL/min (ref >60)
GLUCOSE: 166 mg/dL — AB (ref 70–99)
Potassium: 5.1 mmol/L (ref 3.5–5.1)
SODIUM: 140 mmol/L (ref 135–145)

## 2018-10-18 LAB — POCT I-STAT 3, ART BLOOD GAS (G3+)
ACID-BASE EXCESS: 5 mmol/L — AB (ref 0.0–2.0)
Bicarbonate: 31.9 mmol/L — ABNORMAL HIGH (ref 20.0–28.0)
O2 SAT: 98 %
PCO2 ART: 58.3 mmHg — AB (ref 32.0–48.0)
TCO2: 34 mmol/L — ABNORMAL HIGH (ref 22–32)
pH, Arterial: 7.347 — ABNORMAL LOW (ref 7.350–7.450)
pO2, Arterial: 113 mmHg — ABNORMAL HIGH (ref 83.0–108.0)

## 2018-10-18 LAB — GLUCOSE, CAPILLARY
GLUCOSE-CAPILLARY: 158 mg/dL — AB (ref 70–99)
GLUCOSE-CAPILLARY: 153 mg/dL — AB (ref 70–99)
Glucose-Capillary: 142 mg/dL — ABNORMAL HIGH (ref 70–99)
Glucose-Capillary: 124 mg/dL — ABNORMAL HIGH (ref 70–99)
Glucose-Capillary: 132 mg/dL — ABNORMAL HIGH (ref 70–99)

## 2018-10-18 LAB — HEMOGLOBIN AND HEMATOCRIT, BLOOD
HEMATOCRIT: 27.5 % — AB (ref 36.0–46.0)
HEMOGLOBIN: 8.5 g/dL — AB (ref 12.0–15.0)

## 2018-10-18 LAB — CULTURE, RESPIRATORY

## 2018-10-18 LAB — PREPARE RBC (CROSSMATCH)

## 2018-10-18 LAB — PHOSPHORUS: Phosphorus: 2.7 mg/dL (ref 2.5–4.6)

## 2018-10-18 MED ORDER — CHLORHEXIDINE GLUCONATE CLOTH 2 % EX PADS
6.0000 | MEDICATED_PAD | Freq: Every day | CUTANEOUS | Status: DC
  Administered 2018-10-18 – 2018-10-25 (×7): 6 via TOPICAL

## 2018-10-18 MED ORDER — SODIUM CHLORIDE 0.9% FLUSH
10.0000 mL | Freq: Two times a day (BID) | INTRAVENOUS | Status: DC
  Administered 2018-10-18 (×2): 10 mL
  Administered 2018-10-19: 40 mL
  Administered 2018-10-19 – 2018-10-25 (×6): 10 mL

## 2018-10-18 MED ORDER — SODIUM CHLORIDE 0.9% FLUSH: 10.0000 mL | INTRAVENOUS | Status: DC | PRN

## 2018-10-18 MED ORDER — FUROSEMIDE 10 MG/ML IJ SOLN
40.0000 mg | Freq: Once | INTRAMUSCULAR | Status: AC
  Administered 2018-10-18: 40 mg via INTRAVENOUS
  Filled 2018-10-18: qty 4

## 2018-10-18 MED ORDER — SODIUM CHLORIDE 0.9% IV SOLUTION
Freq: Once | INTRAVENOUS | Status: AC
  Administered 2018-10-18: 18:00:00 via INTRAVENOUS

## 2018-10-18 MED ORDER — SODIUM CHLORIDE 0.9% IV SOLUTION
Freq: Once | INTRAVENOUS | Status: AC
  Administered 2018-10-18: 14:00:00 via INTRAVENOUS

## 2018-10-18 NOTE — Progress Notes (Signed)
Nurse made another attempt to contact mother for consent for blood transfusion. No answer and was unable to leave message. Contacted patient's significant other, Deborah Melendez, to verify phone number. Phone number was verified. Nurse asked Deborah Melendez to have patient's mother call the unit.

## 2018-10-18 NOTE — Progress Notes (Signed)
Nurse attempted to contact mother, Barbaraann Share, at phone number listed by previous shift nurse. There was no answer and no message could be left due to the voicemail not being set up. Will attempt to call again.

## 2018-10-18 NOTE — Progress Notes (Signed)
IV access lost during blood administration.  RN put in stat IV team access and IV team to bedside immediately but were unsuccessful in obtaining peripheral IV.  NP Hoffman informed and decision to place central line made.  Patient's mother at bedside and consented to central line.   RN unable to infuse current blood product due to time lapsed between loss of IV access and central line placement.  NP Ollis informed of blood administration orders for 2 units and loss of current unit due to IV issues.  NP Alfredo Martinez advised to just infuse one unit once IV access obtained.

## 2018-10-18 NOTE — Progress Notes (Signed)
CRITICAL VALUE ALERT  Critical Value:  Hemoglobin 6.9  Date & Time Notied:  10/18/18; 2585  Provider Notified: Dr. Genevive Bi 10/18/18, 0340  Orders Received/Actions taken: Orders received for blood transfusion. Nurse attempting to notify next of kin.

## 2018-10-18 NOTE — Progress Notes (Signed)
NAME:  Lesha Jager, MRN:  [id number], DOB:  07-29-1976, LOS: 4 ADMISSION DATE:  10/14/2018, CONSULTATION DATE:  10/18 REFERRING MD:  Dolly Rias, CHIEF COMPLAINT:  Acute hypoxic respiratory failure    Brief History   42 year old female admitted on 10/18 w/ working dx of asthmatic exacerbation w/ worsening hypoxia and encephalopathy. Intubated  Past Medical History  Cocaine abuse, bipolar disease, depression, asthma   Significant Hospital Events   10/18 admitted, intubated 10/21 self extubated, re-intubated. ABG worse. Deeply sedated and paralyzed.  10/22 hemoglobin low, plans to transfuse 1 unit. Plan to DC nimbex today.   Consults: date of consult/date signed off & final recs:    Procedures (surgical and bedside):  ETT 10/18 >>  Significant Diagnostic Tests:  CT angio 10/14/2018- no pulmonary embolus, right middle lobe collapse, bronchial wall thickening, bronchitis, mild basal atelectasis.   Chest x-ray 10/16/2018- endotracheal tube in stable position.  Linear atelectasis in the right.  I reviewed the images personally.  Urine drug screen 10/14/2018-cocaine, benzos  Micro Data:  Sputum 10/20>>> Respiratory virus panel 10/8 > negative  Antimicrobials:  Azithro 10/18>>>  Subjective:  Improved with paralytic. Hgb low this AM   Objective   Blood pressure 123/70, pulse 90, temperature (!) 97.2 F (36.2 C), resp. rate 20, height 5\' 4"  (1.626 m), weight 84.6 kg, SpO2 97 %.    Vent Mode: PRVC FiO2 (%):  [40 %-100 %] 40 % Set Rate:  [20 bmp] 20 bmp Vt Set:  [500 mL] 500 mL PEEP:  [5 cmH20] 5 cmH20 Plateau Pressure:  [21 cmH20-30 cmH20] 30 cmH20   Intake/Output Summary (Last 24 hours) at 10/18/2018 0848 Last data filed at 10/18/2018 0700 Gross per 24 hour  Intake 4491.46 ml  Output 1820 ml  Net 2671.46 ml   Filed Weights   10/17/18 0313 10/17/18 2130 10/18/18 0500  Weight: 82.4 kg 84.6 kg 84.6 kg    Examination: General:  Critically ill appearing  female Neuro:  Sedated/paralyzed  HEENT:  [place]/AT, PERRL, no JVD. ETT in place.  Cardiovascular:  RRR, no MRG Lungs: Wheeze Abdomen:  Soft, non-distended, non-tender Musculoskeletal:  No acute deformity or edema Skin:  Intact, MMM   Resolved Hospital Problem list     Assessment & Plan:  Acute hypoxic respiratory failure in setting of status asthmaticus. - suspect that there is also a component of upper airway disease/VCD contributing given presenting loud audible wheezing from bedside and heavy psychiatric history. Also consider crack cocaine as contributing factor. UDS was positive for this. Required deep sedation and ultimately neuromuscular blockade 10/21.  Plan Continue vent support  Follow ABG. Continue bronchodilators, systemic steroids Empiric azithromycin Follow sputum cultures. Deep sedation and neuromuscular blockade. Will plan to DC nimbex at noon today for 24 hours total therapy.  VAP bundle   Acute metabolic +/- toxic encephalopathy, h/o bipolar disease and depression. H/o polysubstance abuse including crack cocaine: Presume hypoxia was the driving factor but has heavy h/o bipolar disease and depression. Plan Holding home Seroquel, neurontin and zoloft Propofol, fentanyl, and precedex. RASS goal - 3.   Anemia of chronic disease Plan  Trend cbc Transfuse 1 unit PRBC today. Awaiting consent from family.   Hyperglycemia -likely to be exacerbated by steroids Plan SSI coverage  Leukocytosis: with some question of aspiration. CXR remains clear. Afebrile. Has been on steroids for a few days.  - Azithromycin as above. Monitor. 5 day course will be complete 10/23  Disposition / Summary of Today's Plan 10/18/18  Continue nimbex until noon today for 24 hours therapy.  Follow ABG     Diet: tubefeeds Pain/Anxiety/Delirium protocol (if indicated): PAD protocol diprivan, fentanyl, and precedex. RASS goal -3 VAP protocol (if indicated): yes DVT prophylaxis: [place] heparin    GI prophylaxis: H2B Hyperglycemia protocol: ordered Mobility: advance per RN Code Status: full code Family Communication: briefly touched base with someone who is supposedly daughter, awaiting her arrival to confirm identity and fully update.   Labs   CBC: Recent Labs  Lab 10/14/18 0930 10/14/18 0952 10/15/18 0228 10/16/18 0512 10/18/18 0223  WBC 10.2  --  16.9* 20.9* 13.1*  NEUTROABS 7.2  --   --   --   --   HGB 9.9* 11.2* 8.4* 7.4* 6.9*  HCT 32.2* 33.0* 29.6* 25.3* 22.6*  MCV 88.2  --  92.2 89.7 89.3  PLT 370  --  322 313 638    Basic Metabolic Panel: Recent Labs  Lab 10/14/18 0940 10/14/18 0952 10/14/18 1616 10/14/18 1933 10/15/18 0228 10/15/18 1656 10/16/18 0512 10/18/18 0223  NA 140 141  --  135 136  --  138 140  K 3.6 3.6  --  3.7 4.4  --  4.4 5.1  CL 111 109  --  107 105  --  107 104  CO2 20*  --   --  18* 21*  --  25 28  GLUCOSE 131* 127*  --  206* 192*  --  138* 166*  BUN 5* 5*  --  6 13  --  18 20  CREATININE 0.83 0.80  --  0.92 1.55*  --  0.85 0.76  CALCIUM 8.1*  --   --  8.0* 8.0*  --  8.2* 8.2*  MG  --   --  2.0  --  2.6* 2.7* 2.6* 2.6*  PHOS  --   --  2.8  --  5.2* 4.6 3.2 2.7   GFR: Estimated Creatinine Clearance: 97.4 mL/min (by C-G formula based on SCr of 0.76 mg/dL). Recent Labs  Lab 10/14/18 0930 10/14/18 0940 10/14/18 1140  10/15/18 0228 10/15/18 1150 10/16/18 0512 10/17/18 0941 10/18/18 0223  PROCALCITON  --   --   --    < > 0.25 0.11 <0.10 <0.10  --   WBC 10.2  --   --   --  16.9*  --  20.9*  --  13.1*  LATICACIDVEN  --  1.7 0.9  --   --   --   --   --   --    < > = values in this interval not displayed.    Liver Function Tests: Recent Labs  Lab 10/14/18 0940  AST 29  ALT 15  ALKPHOS 58  BILITOT 0.2*  PROT 7.0  ALBUMIN 3.4*   No results for input(s): LIPASE, AMYLASE in the last 168 hours. No results for input(s): AMMONIA in the last 168 hours.  ABG    Component Value Date/Time   PHART 7.347 (L) 10/18/2018 0254    PCO2ART 58.3 (H) 10/18/2018 0254   PO2ART 113.0 (H) 10/18/2018 0254   HCO3 31.9 (H) 10/18/2018 0254   TCO2 34 (H) 10/18/2018 0254   ACIDBASEDEF 3.0 (H) 10/15/2018 1209   O2SAT 98.0 10/18/2018 0254     Coagulation Profile: No results for input(s): INR, PROTIME in the last 168 hours.  Cardiac Enzymes: No results for input(s): CKTOTAL, CKMB, CKMBINDEX, TROPONINI in the last 168 hours.  HbA1C: Hgb A1c MFr Bld  Date/Time Value Ref Range Status  08/25/2018 11:25 AM 5.3 4.8 - 5.6 % Final    Comment:    (NOTE) Pre diabetes:          5.7%-6.4% Diabetes:              >6.4% Glycemic control for   <7.0% adults with diabetes   10/09/2017 02:28 PM 5.6 4.8 - 5.6 % Final    Comment:    (NOTE) Pre diabetes:          5.7%-6.4% Diabetes:              >6.4% Glycemic control for   <7.0% adults with diabetes     CBG: Recent Labs  Lab 10/17/18 1156 10/17/18 1539 10/17/18 1952 10/17/18 2336 10/18/18 0345  GLUCAP 136* 146* 140* 140* 158*   My critical care time excluding procedures: 30 minutes   Georgann Housekeeper, AGACNP-BC [place] Pager [phone] or [phone]  10/18/2018 8:48 AM

## 2018-10-18 NOTE — Procedures (Signed)
Central Venous Catheter Insertion Procedure Note Deborah Melendez [id number] 04/21/1976  Procedure: Insertion of Central Venous Catheter Indications: Assessment of intravascular volume, Drug and/or fluid administration and Frequent blood sampling  Procedure Details Consent: Risks of procedure as well as the alternatives and risks of each were explained to the (patient/caregiver).  Consent for procedure obtained. Time Out: Verified patient identification, verified procedure, site/side was marked, verified correct patient position, special equipment/implants available, medications/allergies/relevent history reviewed, required imaging and test results available.  Performed  Maximum sterile technique was used including antiseptics, cap, gloves, gown, hand hygiene, mask and sheet. Skin prep: Chlorhexidine; local anesthetic administered A antimicrobial bonded/coated triple lumen catheter was placed in the left internal jugular vein to 18 cm using the Seldinger technique.  Sutured in place, biopatch applied.   Evaluation Blood flow good Complications: No apparent complications Patient did tolerate procedure well. Chest X-ray ordered to verify placement.  CXR: pending.   Procedure performed under direct supervision of Dr. Elsworth Soho and with ultrasound guidance for real time vessel cannulation.      Noe Gens, NP-C [facility] Pgr: [phone] or if no answer [phone] 10/18/2018, 4:04 PM

## 2018-10-18 NOTE — Progress Notes (Signed)
eLink Physician-Brief Progress Note Patient Name: Deborah Melendez DOB: 06-10-1976 MRN: [id number]   Date of Service  10/18/2018  HPI/Events of Note  Notified of Hgb 6.9 from 7.4 without active signs of bleeding.  eICU Interventions  Ordered to transfuse 1 unit PRBC     Intervention Category Intermediate Interventions: Bleeding - evaluation and treatment with blood products  Judd Lien 10/18/2018, 4:09 AM

## 2018-10-18 NOTE — Progress Notes (Signed)
42 year old with asthma and cocaine abuse admitted 10/18 with status asthmaticus requiring mechanical ventilation. CT angiogram from 10/18 was personally reviewed which showed mild bibasilar atelectasis and right middle lobe collapse. She had severe bronchospasm and was being treated with steroids and bronchodilators  She self extubated twice on 10/21, reintubated, deeply sedated and paralyzed. On exam-ventilator waveforms appear improved still with prolonged expiration, decreased bilateral rhonchi, S1-S2 normal, no edema or JVD, soft nontender abdomen.  Chest x-ray personally reviewed shows ET tube in position and pulmonary congestion ABG shows improved respiratory acidosis, hemoglobin has dropped to 6.9, mild leukocytosis persists.  Impression/plan Status asthmaticus/acute respiratory failure-can discontinue paralytic, keep respiratory rate at 20/min to allow for long expiration. We will permit some degree of acute respiratory acidosis. Maintain deep sedation with propofol and fentanyl for RA SS -5 and try to taper Versed to off, goal will be vent synchrony. Once bronchospasm resolves hopefully we can start weaning.  Anemia-no evidence of blood loss, will transfuse 1 unit PRBC.  Cocaine abuse-no history of vaping or signs of lung injury.  My critical care time x 17m  Kara Mead MD. FCCP. [facility] Pager [phone] If no response call [phone]   10/18/2018

## 2018-10-19 ENCOUNTER — Other Ambulatory Visit (HOSPITAL_COMMUNITY): Payer: Self-pay

## 2018-10-19 ENCOUNTER — Inpatient Hospital Stay (HOSPITAL_COMMUNITY): Payer: Self-pay

## 2018-10-19 LAB — GLUCOSE, CAPILLARY
GLUCOSE-CAPILLARY: 125 mg/dL — AB (ref 70–99)
Glucose-Capillary: 125 mg/dL — ABNORMAL HIGH (ref 70–99)
Glucose-Capillary: 125 mg/dL — ABNORMAL HIGH (ref 70–99)
Glucose-Capillary: 126 mg/dL — ABNORMAL HIGH (ref 70–99)
Glucose-Capillary: 135 mg/dL — ABNORMAL HIGH (ref 70–99)
Glucose-Capillary: 146 mg/dL — ABNORMAL HIGH (ref 70–99)

## 2018-10-19 LAB — BASIC METABOLIC PANEL
Anion gap: 6 (ref 5–15)
BUN: 20 mg/dL (ref 6–20)
CO2: 33 mmol/L — ABNORMAL HIGH (ref 22–32)
Calcium: 8.8 mg/dL — ABNORMAL LOW (ref 8.9–10.3)
Chloride: 100 mmol/L (ref 98–111)
Creatinine, Ser: 0.8 mg/dL (ref 0.44–1.00)
GFR calc Af Amer: >60 mL/min (ref >60)
GFR calc non Af Amer: >60 mL/min (ref >60)
GLUCOSE: 127 mg/dL — AB (ref 70–99)
POTASSIUM: 4.8 mmol/L (ref 3.5–5.1)
Sodium: 139 mmol/L (ref 135–145)

## 2018-10-19 LAB — BPAM RBC
Blood Product Expiration Date: 201911242359
Blood Product Expiration Date: 201911242359
ISSUE DATE / TIME: 201910221749
ISSUE DATE / TIME: 201910221410
UNIT TYPE AND RH: 5100
Unit Type and Rh: 5100

## 2018-10-19 LAB — TYPE AND SCREEN
ABO/RH(D): O POS
ANTIBODY SCREEN: NEGATIVE
UNIT DIVISION: 0
Unit division: 0

## 2018-10-19 LAB — CBC
HEMATOCRIT: 26.5 % — AB (ref 36.0–46.0)
Hemoglobin: 8.2 g/dL — ABNORMAL LOW (ref 12.0–15.0)
MCH: 27.3 pg (ref 26.0–34.0)
MCHC: 30.9 g/dL (ref 30.0–36.0)
MCV: 88.3 fL (ref 80.0–100.0)
Platelets: 270 10*3/uL (ref 150–400)
RBC: 3 MIL/uL — AB (ref 3.87–5.11)
RDW: 20.6 % — ABNORMAL HIGH (ref 11.5–15.5)
WBC: 11 10*3/uL — ABNORMAL HIGH (ref 4.0–10.5)
nRBC: 0.5 % — ABNORMAL HIGH (ref 0.0–0.2)

## 2018-10-19 LAB — PHOSPHORUS: Phosphorus: 3.3 mg/dL (ref 2.5–4.6)

## 2018-10-19 LAB — MAGNESIUM: Magnesium: 2.3 mg/dL (ref 1.7–2.4)

## 2018-10-19 MED ORDER — FUROSEMIDE 10 MG/ML IJ SOLN
40.0000 mg | Freq: Three times a day (TID) | INTRAMUSCULAR | Status: DC
  Administered 2018-10-19 – 2018-10-24 (×16): 40 mg via INTRAVENOUS
  Filled 2018-10-19 (×16): qty 4

## 2018-10-19 MED ORDER — HYDRALAZINE HCL 20 MG/ML IJ SOLN
10.0000 mg | INTRAMUSCULAR | Status: DC | PRN
  Administered 2018-10-19: 20 mg via INTRAVENOUS
  Filled 2018-10-19: qty 1

## 2018-10-19 MED ORDER — SODIUM CHLORIDE 0.9 % IV SOLN
1.0000 g | Freq: Four times a day (QID) | INTRAVENOUS | Status: DC
  Administered 2018-10-19 – 2018-10-23 (×17): 1 g via INTRAVENOUS
  Filled 2018-10-19 (×18): qty 1000

## 2018-10-19 MED ORDER — POTASSIUM CHLORIDE 20 MEQ/15ML (10%) PO SOLN
40.0000 meq | Freq: Two times a day (BID) | ORAL | Status: DC
  Administered 2018-10-19 (×2): 40 meq
  Filled 2018-10-19 (×3): qty 30

## 2018-10-19 MED ORDER — VITAL HIGH PROTEIN PO LIQD
1000.0000 mL | ORAL | Status: DC
  Administered 2018-10-20 – 2018-10-24 (×4): 1000 mL

## 2018-10-19 NOTE — Progress Notes (Signed)
NAME:  Deborah Melendez, MRN:  [id number], DOB:  1976/09/20, LOS: 5 ADMISSION DATE:  10/14/2018, CONSULTATION DATE:  10/18 REFERRING MD:  Dolly Rias, CHIEF COMPLAINT:  Acute hypoxic respiratory failure    Brief History   42 year old female admitted on 10/18 w/ working dx of asthmatic exacerbation w/ worsening hypoxia and encephalopathy. Intubated  Past Medical History  Cocaine abuse, bipolar disease, depression, asthma   Significant Hospital Events   10/18 admitted, intubated 10/21 self extubated x 2 on same day, re-intubated. ABG worse. Deeply sedated and paralyzed.  10/22 hemoglobin low, plans to transfuse 1 unit. Plan to DC nimbex today.   Consults: date of consult/date signed off & final recs:    Procedures (surgical and bedside):  ETT 10/18 >>  Significant Diagnostic Tests:  CT angio 10/14/2018- no pulmonary embolus, right middle lobe collapse, bronchial wall thickening, bronchitis, mild basal atelectasis.   Chest x-ray 10/16/2018- endotracheal tube in stable position.  Linear atelectasis in the right.  I reviewed the images personally.  Urine drug screen 10/14/2018-cocaine, benzos  Micro Data:  HIV 10/19 - neg Sputum 10/20>>>group B strep Respiratory virus panel 10/8 > negative  Antimicrobials:  Azithro 10/18>>>10/23,. Penicillin    SUBJECTIVE/OVERNIGHT/INTERVAL HX   10/19/2018 - Got vent dysnch on sedation wua. HR 130 and wheezing. Agitated on WUA. Hypetensive   Objective   Blood pressure (!) 154/97, pulse (!) 144, temperature 98.6 F (37 C), resp. rate (!) 35, height 5\' 4"  (1.626 m), weight 84.4 kg, SpO2 99 %.    Vent Mode: PRVC FiO2 (%):  [40 %] 40 % Set Rate:  [20 bmp] 20 bmp Vt Set:  [500 mL] 500 mL PEEP:  [5 cmH20] 5 cmH20 Plateau Pressure:  [24 cmH20-36 cmH20] 36 cmH20   Intake/Output Summary (Last 24 hours) at 10/19/2018 0954 Last data filed at 10/19/2018 0900 Gross per 24 hour  Intake 2984.24 ml  Output 5090 ml  Net -2105.76 ml   Filed  Weights   10/17/18 2130 10/18/18 0500 10/19/18 0500  Weight: 84.6 kg 84.6 kg 84.4 kg   General Appearance:  Looks criticall ill OBESE - + Head:  Normocephalic, without obvious abnormality, atraumatic Eyes:  PERRL - yes, conjunctiva/corneas - clea     Ears:  Normal external ear canals, both ears Nose:  G tube - no Throat:  ETT TUBE - yes , OG tube - uy Neck:  Supple,  No enlargement/tenderness/nodules Lungs: Clear to auscultation bilaterally, Ventilator   Synchrony - NO Heart:  S1 and S2 normal, no murmur, CVP - no.  Pressors - no Abdomen:  Soft, no masses, no organomegaly Genitalia / Rectal:  Not done Extremities:  Extremities- intact Skin:  ntact in exposed areas .  Neurologic:  Sedation - fent diprivan gtt -> RASS - -3 . Moves all 4s - yes. CAM-ICU - positive delirum when wUA . Orientation - not     LABS    PULMONARY Recent Labs  Lab 10/17/18 0320 10/17/18 0916 10/17/18 1118 10/17/18 1418 10/18/18 0254  PHART 7.265* 7.379 7.244* 7.289* 7.347*  PCO2ART 59.8* 49.3* 74.6* 63.6* 58.3*  PO2ART 123.0* 118.0* 258.0* 148.0* 113.0*  HCO3 27.1 29.0* 32.1* 30.5* 31.9*  TCO2 29 30 34* 32 34*  O2SAT 98.0 98.0 100.0 99.0 98.0    CBC Recent Labs  Lab 10/16/18 0512 10/18/18 0223 10/18/18 2242 10/19/18 0202  HGB 7.4* 6.9* 8.5* 8.2*  HCT 25.3* 22.6* 27.5* 26.5*  WBC 20.9* 13.1*  --  11.0*  PLT 313 275  --  270    COAGULATION No results for input(s): INR in the last 168 hours.  CARDIAC  No results for input(s): TROPONINI in the last 168 hours. No results for input(s): PROBNP in the last 168 hours.   CHEMISTRY Recent Labs  Lab 10/14/18 1933 10/15/18 0228 10/15/18 1656 10/16/18 0512 10/18/18 0223 10/19/18 0202  NA 135 136  --  138 140 139  K 3.7 4.4  --  4.4 5.1 4.8  CL 107 105  --  107 104 100  CO2 18* 21*  --  25 28 33*  GLUCOSE 206* 192*  --  138* 166* 127*  BUN 6 13  --  18 20 20   CREATININE 0.92 1.55*  --  0.85 0.76 0.80  CALCIUM 8.0* 8.0*  --  8.2*  8.2* 8.8*  MG  --  2.6* 2.7* 2.6* 2.6* 2.3  PHOS  --  5.2* 4.6 3.2 2.7 3.3   Estimated Creatinine Clearance: 97.3 mL/min (by C-G formula based on SCr of 0.8 mg/dL).   LIVER Recent Labs  Lab 10/14/18 0940  AST 29  ALT 15  ALKPHOS 58  BILITOT 0.2*  PROT 7.0  ALBUMIN 3.4*     INFECTIOUS Recent Labs  Lab 10/14/18 0940 10/14/18 1140  10/15/18 1150 10/16/18 0512 10/17/18 0941  LATICACIDVEN 1.7 0.9  --   --   --   --   PROCALCITON  --   --    < > 0.11 <0.10 <0.10   < > = values in this interval not displayed.     ENDOCRINE CBG (last 3)  Recent Labs    10/19/18 0010 10/19/18 0357 10/19/18 0755  GLUCAP 125* 146* 135*         IMAGING x48h  - image(s) personally visualized  -   highlighted in bold Dg Chest Port 1 View  Result Date: 10/18/2018 CLINICAL DATA:  Encounter for central line placement EXAM: PORTABLE CHEST 1 VIEW COMPARISON:  10/18/2018 FINDINGS: The endotracheal tube is in place, tip approximately 2.5 centimeters above the carina. A LEFT IJ central line has been placed, tip overlying the level of the superior vena cava. Nasogastric tube is in place, tip beyond the level of the mid stomach off the image. Heart size is normal. The lungs are clear. There is no pneumothorax. IMPRESSION: Interval placement of LEFT IJ central line.  No pneumothorax. Electronically Signed   By: Nolon Nations M.D.   On: 10/18/2018 16:58   Dg Chest Port 1 View  Result Date: 10/18/2018 CLINICAL DATA:  Endotracheal tube present, acute respiratory failure, asthma EXAM: PORTABLE CHEST 1 VIEW COMPARISON:  Portable chest x-ray of 10/17/2017 FINDINGS: The tip of the endotracheal tube is only 5 mm above the carina and needs to be with tract by approximately 2 cm. The lungs are not as well aerated and there are is some prominence of pulmonary vascularity. Is there any suspicion of developing pulmonary vascular congestion?Marland Kitchen No pneumonia or effusion is seen. NG tube extends into the stomach.  IMPRESSION: 1. Tip of endotracheal tube only 5 mm above the carina. Recommend retracting by approximately 2 cm. 2. Somewhat prominent vascularity throughout the lungs may indicate mild pulmonary vascular congestion in the proper clinical setting. Correlate clinically. Electronically Signed   By: Ivar Drape M.D.   On: 10/18/2018 10:06   Dg Chest Port 1 View  Result Date: 10/17/2018 CLINICAL DATA:  Intubation EXAM: PORTABLE CHEST 1 VIEW COMPARISON:  10/17/2018 at 0222 hours FINDINGS: Endotracheal tube terminates 3 cm above the  carina. Lungs are clear.  Straight effusion The heart is normal in size. Enteric tube courses below the diaphragm. IMPRESSION: Endotracheal tube terminates 3 cm above the carina. Electronically Signed   By: Julian Hy M.D.   On: 10/17/2018 10:23   Dg Abd Portable 1v  Result Date: 10/17/2018 CLINICAL DATA:  OG tube placement EXAM: PORTABLE ABDOMEN - 1 VIEW COMPARISON:  10/17/2018 at 0227 hours FINDINGS: Enteric tube terminates in the proximal duodenum. IMPRESSION: Enteric tube terminates in the proximal duodenum. Electronically Signed   By: Julian Hy M.D.   On: 10/17/2018 10:22   [place] Ekg Site Rite  Result Date: 10/17/2018 If Site Rite image not attached, placement could not be confirmed due to current cardiac rhythm.    Recent Results (from the past 240 hour(s))  MRSA PCR Screening     Status: None   Collection Time: 10/14/18  2:19 PM  Result Value Ref Range Status   MRSA by PCR NEGATIVE NEGATIVE Final    Comment:        The GeneXpert MRSA Assay (FDA approved for NASAL specimens only), is one component of a comprehensive MRSA colonization surveillance program. It is not intended to diagnose MRSA infection nor to guide or monitor treatment for MRSA infections. Performed at [facility], [place] [address], [place], [place] [address]   Culture, respiratory (tracheal aspirate)     Status: None   Collection Time: 10/16/18  3:34 PM  Result Value  Ref Range Status   Specimen Description TRACHEAL ASPIRATE  Final   Special Requests NONE  Final   Gram Stain   Final    FEW WBC PRESENT, PREDOMINANTLY PMN NO ORGANISMS SEEN    Culture   Final    FEW GROUP B STREP(S.AGALACTIAE)ISOLATED TESTING AGAINST S. AGALACTIAE NOT ROUTINELY PERFORMED DUE TO PREDICTABILITY OF AMP/PEN/VAN SUSCEPTIBILITY. Performed at [facility], [place] [address] [place], [place] [address]    Report Status 10/18/2018 FINAL  Final     Resolved Hospital Problem list     Assessment & Plan:  Acute hypoxic respiratory failure in setting of status asthmaticus. - suspect that there is also a component of upper airway disease/VCD contributing given presenting loud audible wheezing from bedside and heavy psychiatric history. Also consider crack cocaine as contributing factor. UDS was positive for this. Required deep sedation and ultimately neuromuscular blockade 10/21.- 10/19/18   10/19/2018 - > does not meet criteria for SBT/Extubation in setting of Acute Respiratory Failure due to agitation, wheezing   Plan PRVC BD/steroids VAP preventioin DC brovana (tachy) Diurese Check echo   Acute metabolic +/- toxic encephalopathy, h/o bipolar disease and depression. H/o polysubstance abuse including crack cocaine: Presume hypoxia was the driving factor but has heavy h/o bipolar disease and depression.  - 10/19/2018 = ongoing agitation during [place] neurontin Continue seroquel Holding home  zoloft Propofol, fentanyl, and precedex. RASS goal - 3.   = check CK/lactate for Diprivan infusion syndrome   Anemia of chronic disease Plan  - PRBC for hgb </= 6.9gm%    - exceptions are   -  if ACS susepcted/confirmed then transfuse for hgb </= 8.0gm%,  or    -  active bleeding with hemodynamic instability, then transfuse regardless of hemoglobin value   At at all times try to transfuse 1 unit prbc as possible with exception of active  hemorrhage    Hyperglycemia -likely to be exacerbated by steroids Plan SSI coverage   Possible acute bronchitis  -  change azithro to Ampicillin   Disposition / Summary of Today's Plan 10/19/18        Diet: tubefeeds Pain/Anxiety/Delirium protocol (if indicated): PAD protocol diprivan, fentanyl, and precedex. RASS goal -3 VAP protocol (if indicated): yes DVT prophylaxis: [place]iss heparin  GI prophylaxis: H2B Hyperglycemia protocol: ordered Mobility: advance per RN Code Status: full code Family Communication: none at besdie 10/19/2018  ATTESTATION & SIGNATURE      Rest per NP/medical resident whose note is outlined above and that I agree with  The patient is critically ill with multiple organ systems failure and requires high complexity decision making for assessment and support, frequent evaluation and titration of therapies, application of advanced monitoring technologies and extensive interpretation of multiple databases.   Critical Care Time devoted to patient care services described in this note is  30  Minutes. This time reflects time of care of this signee Dr Brand Males. This critical care time does not reflect procedure time, or teaching time or supervisory time of PA/NP/Med student/Med Resident etc but could involve care discussion time     Dr. Brand Males, M.D., [facility].C.P Pulmonary and Critical Care Medicine Staff Physician [place] [facility] Pager: [phone], If no answer or between  15:00h - 7:00h: call [phone]  10/19/2018 9:55 AM

## 2018-10-19 NOTE — Progress Notes (Signed)
Nutrition Follow-up  DOCUMENTATION CODES:   Obesity unspecified  INTERVENTION:   -Vital High Protein @ 55 ml/hr (1320 ml) via OGT  Provides: 1320 kcals (5361 with current propofol rate), 116 grams protein, 1104 ml free water.   NUTRITION DIAGNOSIS:   Inadequate oral intake related to inability to eat as evidenced by NPO status.  [place]  GOAL:   Patient will meet greater than or equal to 90% of their needs  Not meeting with TF- advancing today  MONITOR:   Vent status, Labs, Diet advancement, I & O's, TF tolerance, Weight trends  REASON FOR ASSESSMENT:   Consult Enteral/tube feeding initiation and management  ASSESSMENT:   Patient with PMH significant for asthma, bipolar 1 disorder, depression, cocaine abuse, MDD, and suicidal ideation. Presents this admission with asthmatic exacerbation and VCD with worsening hypoxia/encephalopathy (intuabated in ED).   10/18- admitted, intubated  10/21- self extubated x2, deeply sedated and paralyzed, re-intubated 10/22- nimbex d/c, TF re-started   Pt does not meet criteria for SBT at this time. Attempted sedation vacation this am, BP increased. Vital HP re-started last night @ 20 ml/hr via OGT. Will increase rate today to better meet pt's needs. Last BM noted to be on 10/10. Discussed with RN who is to administer PRN bowel regimen this afternoon.   Weight noted to increase from 174 lb on 10/18 to 186 lb today. Pt continues to diurese with lasix. Nutrition needs based off dry weight of 76.3 kg.   Patient remains intubated on ventilator support MV: 10.3 L/min Temp (24hrs), Avg:98.5 F (36.9 C), Min:97.3 F (36.3 C), Max:100 F (37.8 C) BP: 155/98 MAP: 112 Propofol: 16.6 ml/hr- provides 438 kcal  I/O: + 9.1 L since admit UOP: 4680 ml x 24 hrs   Medications reviewed and include: 40 mg lasix BID, solu-medrol, 20 mEq KCl BID, fentanyl, versed, propofol Labs reviewed: CBG 124-153   Diet Order:   Diet Order            Diet NPO  time specified  Diet effective now              EDUCATION NEEDS:   Not appropriate for education at this time  Skin:  Skin Assessment: Reviewed RN Assessment  Last BM:  10/06/18  Height:   Ht Readings from Last 1 Encounters:  10/14/18 5\' 4"  (1.626 m)    Weight:   Wt Readings from Last 1 Encounters:  10/19/18 84.4 kg    Ideal Body Weight:  54.5 kg  BMI:  Body mass index is 31.94 kg/m.  Estimated Nutritional Needs:   Kcal:  1760 kcal  Protein:  100-120 grams  Fluid:  >/= 1.7 L/day  Mariana Single RD, LDN Clinical Nutrition Pager # - [phone]

## 2018-10-19 NOTE — Progress Notes (Signed)
Chart reviewed for LOS; B Ysabelle Goodroe RN,MHA,BSN [phone] 

## 2018-10-19 NOTE — Plan of Care (Signed)
  Problem: Clinical Measurements: Goal: Ability to maintain clinical measurements within normal limits will improve Outcome: Progressing Goal: Will remain free from infection Outcome: Progressing Goal: Diagnostic test results will improve Outcome: Progressing Goal: Respiratory complications will improve Outcome: Progressing Goal: Cardiovascular complication will be avoided Outcome: Progressing   Problem: Nutrition: Goal: Adequate nutrition will be maintained Outcome: Progressing   Problem: Safety: Goal: Ability to remain free from injury will improve Outcome: Progressing   Problem: Activity: Goal: Risk for activity intolerance will decrease Outcome: Not Progressing   Problem: Education: Goal: Knowledge of General Education information will improve Description Including pain rating scale, medication(s)/side effects and non-pharmacologic comfort measures Outcome: Not Met (add Reason)   Problem: Health Behavior/Discharge Planning: Goal: Ability to manage health-related needs will improve Outcome: Not Met (add Reason)

## 2018-10-20 ENCOUNTER — Inpatient Hospital Stay (HOSPITAL_COMMUNITY): Payer: Self-pay

## 2018-10-20 DIAGNOSIS — J9621 Acute and chronic respiratory failure with hypoxia: Secondary | ICD-10-CM

## 2018-10-20 LAB — CK TOTAL AND CKMB (NOT AT ARMC)
CK, MB: 2 ng/mL (ref 0.5–5.0)
RELATIVE INDEX: 1.9 (ref 0.0–2.5)
Total CK: 106 U/L (ref 38–234)

## 2018-10-20 LAB — CBC WITH DIFFERENTIAL/PLATELET
Abs Immature Granulocytes: 0.13 10*3/uL — ABNORMAL HIGH (ref 0.00–0.07)
Basophils Absolute: 0 10*3/uL (ref 0.0–0.1)
Basophils Relative: 0 %
EOS PCT: 0 %
Eosinophils Absolute: 0 10*3/uL (ref 0.0–0.5)
HEMATOCRIT: 29.6 % — AB (ref 36.0–46.0)
HEMOGLOBIN: 9 g/dL — AB (ref 12.0–15.0)
Immature Granulocytes: 1 %
LYMPHS ABS: 0.6 10*3/uL — AB (ref 0.7–4.0)
LYMPHS PCT: 6 %
MCH: 27.1 pg (ref 26.0–34.0)
MCHC: 30.4 g/dL (ref 30.0–36.0)
MCV: 89.2 fL (ref 80.0–100.0)
MONO ABS: 0.8 10*3/uL (ref 0.1–1.0)
Monocytes Relative: 8 %
NEUTROS ABS: 8.8 10*3/uL — AB (ref 1.7–7.7)
NEUTROS PCT: 85 %
Platelets: 295 10*3/uL (ref 150–400)
RBC: 3.32 MIL/uL — ABNORMAL LOW (ref 3.87–5.11)
RDW: 20.5 % — ABNORMAL HIGH (ref 11.5–15.5)
WBC: 10.3 10*3/uL (ref 4.0–10.5)
nRBC: 0.3 % — ABNORMAL HIGH (ref 0.0–0.2)

## 2018-10-20 LAB — BASIC METABOLIC PANEL
ANION GAP: 7 (ref 5–15)
BUN: 30 mg/dL — ABNORMAL HIGH (ref 6–20)
CO2: 36 mmol/L — AB (ref 22–32)
Calcium: 9.1 mg/dL (ref 8.9–10.3)
Chloride: 93 mmol/L — ABNORMAL LOW (ref 98–111)
Creatinine, Ser: 0.87 mg/dL (ref 0.44–1.00)
GFR calc Af Amer: >60 mL/min (ref >60)
GFR calc non Af Amer: >60 mL/min (ref >60)
GLUCOSE: 143 mg/dL — AB (ref 70–99)
POTASSIUM: 5.2 mmol/L — AB (ref 3.5–5.1)
Sodium: 136 mmol/L (ref 135–145)

## 2018-10-20 LAB — GLUCOSE, CAPILLARY
GLUCOSE-CAPILLARY: 147 mg/dL — AB (ref 70–99)
GLUCOSE-CAPILLARY: 151 mg/dL — AB (ref 70–99)
Glucose-Capillary: 130 mg/dL — ABNORMAL HIGH (ref 70–99)
Glucose-Capillary: 157 mg/dL — ABNORMAL HIGH (ref 70–99)
Glucose-Capillary: 163 mg/dL — ABNORMAL HIGH (ref 70–99)
Glucose-Capillary: 142 mg/dL — ABNORMAL HIGH (ref 70–99)
Glucose-Capillary: 117 mg/dL — ABNORMAL HIGH (ref 70–99)

## 2018-10-20 LAB — ECHOCARDIOGRAM COMPLETE
Height: 64 in
Weight: 2836 oz

## 2018-10-20 LAB — PHOSPHORUS: Phosphorus: 5 mg/dL — ABNORMAL HIGH (ref 2.5–4.6)

## 2018-10-20 LAB — MAGNESIUM: Magnesium: 2.1 mg/dL (ref 1.7–2.4)

## 2018-10-20 LAB — LACTIC ACID, PLASMA: Lactic Acid, Venous: 0.8 mmol/L (ref 0.5–1.9)

## 2018-10-20 MED ORDER — POTASSIUM CHLORIDE 20 MEQ/15ML (10%) PO SOLN
40.0000 meq | Freq: Every day | ORAL | Status: DC
  Administered 2018-10-20 – 2018-10-23 (×4): 40 meq
  Filled 2018-10-20 (×4): qty 30

## 2018-10-20 MED ORDER — HALOPERIDOL LACTATE 5 MG/ML IJ SOLN
5.0000 mg | INTRAMUSCULAR | Status: AC
  Administered 2018-10-20: 5 mg via INTRAVENOUS
  Filled 2018-10-20: qty 1

## 2018-10-20 NOTE — Care Management Note (Signed)
Case Management Note  Patient Details  Name: Priscillia Fouch MRN: [id number] Date of Birth: 04-Feb-1976  Subjective/Objective:     asthmatic exacerbation w/ worsening hypoxia and encephalopathy. Intubated              Action/Plan: Remains in the ICU, Vent support; general condition remains guarded. CM following from a distance.  Expected Discharge Date:   undetermined at this time               Expected Discharge Plan:   Home  Sherrilyn Rist [phone] 10/20/2018, 12:15 PM

## 2018-10-20 NOTE — Progress Notes (Signed)
  Echocardiogram 2D Echocardiogram has been performed.  Deborah Melendez M 10/20/2018, 7:50 AM

## 2018-10-20 NOTE — Progress Notes (Signed)
Per Dr. Chase Caller: Advance ETT to 25 cm - RT aware.

## 2018-10-20 NOTE — Progress Notes (Signed)
NAME:  Deborah Melendez, MRN:  [id number], DOB:  11-Feb-1976, LOS: 6 ADMISSION DATE:  10/14/2018, CONSULTATION DATE:  10/18 REFERRING MD:  Dolly Rias, CHIEF COMPLAINT:  Acute hypoxic respiratory failure    Brief History   42 year old female admitted on 10/18 w/ working dx of asthmatic exacerbation w/ worsening hypoxia and encephalopathy. Intubated  Past Medical History  Cocaine abuse, bipolar disease, depression, asthma   Significant Hospital Events   10/18 admitted, intubated 10/21 self extubated x 2 on same day, re-intubated. ABG worse. Deeply sedated and paralyzed.  10/22 hemoglobin low, plans to transfuse 1 unit. Plan to DC nimbex today.  10/23 -  Got vent dysnch on sedation wua. HR 130 and wheezing. Agitated on WUA. Hypetensive  Consults: date of consult/date signed off & final recs:    Procedures (surgical and bedside):  ETT 10/18 >>  Significant Diagnostic Tests:  CT angio 10/14/2018- no pulmonary embolus, right middle lobe collapse, bronchial wall thickening, bronchitis, mild basal atelectasis.   Chest x-ray 10/16/2018- endotracheal tube in stable position.  Linear atelectasis in the right.  I reviewed the images personally.  Urine drug screen 10/14/2018-cocaine, benzos, marojiuana (these are frequenbtly positive()  Micro Data:  HIV 10/19 - neg Sputum 10/20>>>group B strep Respiratory virus panel 10/8 > negative  Antimicrobials:  Azithro 10/18>>>10/23,. AMpicillin 10/23 >   SUBJECTIVE/OVERNIGHT/INTERVAL HX   10/20/2018 - TF advanced yesterday. CK 106. Echo results pending. Not wheezing anymore after lasix. Agitated after WUA. RN not sure if patient vapes.d/w ,mom - "smokes cigs a lot and does drugs". Mom does NOT know if patient vapes but "likely smokes instead of vapes" . Mom needs to check with BF   ETT tube appears high    Objective   Blood pressure (!) 130/92, pulse (!) 113, temperature 99 F (37.2 C), resp. rate 20, height 5\' 4"  (1.626 m), weight  80.4 kg, SpO2 100 %.    Vent Mode: PRVC FiO2 (%):  [40 %] 40 % Set Rate:  [20 bmp] 20 bmp Vt Set:  [500 mL] 500 mL PEEP:  [5 cmH20] 5 cmH20 Plateau Pressure:  [18 cmH20-29 cmH20] 18 cmH20   Intake/Output Summary (Last 24 hours) at 10/20/2018 1006 Last data filed at 10/20/2018 0900 Gross per 24 hour  Intake 2664.31 ml  Output 7328 ml  Net -4663.69 ml   Filed Weights   10/18/18 0500 10/19/18 0500 10/20/18 0500  Weight: 84.6 kg 84.4 kg 80.4 kg     General Appearance:  Looks criticall ill OBESE - + Head:  Normocephalic, without obvious abnormality, atraumatic Eyes:  PERRL - yes, conjunctiva/corneas - clear     Ears:  Normal external ear canals, both ears Nose:  G tube - no Throat:  ETT TUBE - yes , OG tube - yes Neck:  Supple,  No enlargement/tenderness/nodules Lungs: Clear to auscultation bilaterally, Ventilator   Synchrony - yes Heart:  S1 and S2 normal, no murmur, CVP - no.  Pressors - no Abdomen:  Soft, no masses, no organomegaly Genitalia / Rectal:  Not done Extremities:  Extremities- intact Skin:  ntact in exposed areas . Sacral area - x Neurologic:  Sedation - fent, diprivan -> RASS - -3 . Moves all 4s - yes. CAM-ICU - positive,  . Orientation - aigtated and not oriented on [place]  Lab 10/17/18 0320 10/17/18 0916 10/17/18 1118 10/17/18 1418 10/18/18 0254  PHART 7.265* 7.379 7.244* 7.289* 7.347*  PCO2ART 59.8* 49.3* 74.6* 63.6* 58.3*  PO2ART 123.0* 118.0* 258.0* 148.0* 113.0*  HCO3 27.1 29.0* 32.1* 30.5* 31.9*  TCO2 29 30 34* 32 34*  O2SAT 98.0 98.0 100.0 99.0 98.0    CBC Recent Labs  Lab 10/18/18 0223 10/18/18 2242 10/19/18 0202 10/20/18 0425  HGB 6.9* 8.5* 8.2* 9.0*  HCT 22.6* 27.5* 26.5* 29.6*  WBC 13.1*  --  11.0* 10.3  PLT 275  --  270 295    COAGULATION No results for input(s): INR in the last 168 hours.  CARDIAC  No results for input(s): TROPONINI in the last 168 hours. No results for input(s):  PROBNP in the last 168 hours.   CHEMISTRY Recent Labs  Lab 10/15/18 0228 10/15/18 1656 10/16/18 0512 10/18/18 0223 10/19/18 0202 10/20/18 0113 10/20/18 0425  NA 136  --  138 140 139 136  --   K 4.4  --  4.4 5.1 4.8 5.2*  --   CL 105  --  107 104 100 93*  --   CO2 21*  --  25 28 33* 36*  --   GLUCOSE 192*  --  138* 166* 127* 143*  --   BUN 13  --  18 20 20  30*  --   CREATININE 1.55*  --  0.85 0.76 0.80 0.87  --   CALCIUM 8.0*  --  8.2* 8.2* 8.8* 9.1  --   MG 2.6* 2.7* 2.6* 2.6* 2.3  --  2.1  PHOS 5.2* 4.6 3.2 2.7 3.3  --  5.0*   Estimated Creatinine Clearance: 87.3 mL/min (by C-G formula based on SCr of 0.87 mg/dL).   LIVER Recent Labs  Lab 10/14/18 0940  AST 29  ALT 15  ALKPHOS 58  BILITOT 0.2*  PROT 7.0  ALBUMIN 3.4*     INFECTIOUS Recent Labs  Lab 10/14/18 0940 10/14/18 1140  10/15/18 1150 10/16/18 0512 10/17/18 0941 10/20/18 0113  LATICACIDVEN 1.7 0.9  --   --   --   --  0.8  PROCALCITON  --   --    < > 0.11 <0.10 <0.10  --    < > = values in this interval not displayed.     ENDOCRINE CBG (last 3)  Recent Labs    10/20/18 0108 10/20/18 0428 10/20/18 0753  GLUCAP 130* 147* 157*         IMAGING x48h  - image(s) personally visualized  -   highlighted in bold Dg Chest Port 1 View  Result Date: 10/19/2018 CLINICAL DATA:  ET tube present.  Respiratory failure. EXAM: PORTABLE CHEST 1 VIEW COMPARISON:  10/18/2018. FINDINGS: Support tubes and lines are unchanged. Veiling opacity RIGHT hemithorax could represent a layering effusion. LEFT lung is clear. IMPRESSION: Veiling opacity RIGHT hemithorax is new, and could represent a layering effusion. Electronically Signed   By: Staci Righter M.D.   On: 10/19/2018 10:42   Dg Chest Port 1 View  Result Date: 10/18/2018 CLINICAL DATA:  Encounter for central line placement EXAM: PORTABLE CHEST 1 VIEW COMPARISON:  10/18/2018 FINDINGS: The endotracheal tube is in place, tip approximately 2.5 centimeters  above the carina. A LEFT IJ central line has been placed, tip overlying the level of the superior vena cava. Nasogastric tube is in place, tip beyond the level of the mid stomach off the image. Heart size is normal. The lungs are clear. There is no pneumothorax. IMPRESSION: Interval placement of LEFT IJ central line.  No pneumothorax. Electronically Signed   By: Nolon Nations  M.D.   On: 10/18/2018 16:58     Recent Results (from the past 240 hour(s))  MRSA PCR Screening     Status: None   Collection Time: 10/14/18  2:19 PM  Result Value Ref Range Status   MRSA by PCR NEGATIVE NEGATIVE Final    Comment:        The GeneXpert MRSA Assay (FDA approved for NASAL specimens only), is one component of a comprehensive MRSA colonization surveillance program. It is not intended to diagnose MRSA infection nor to guide or monitor treatment for MRSA infections. Performed at [facility], [place] [address], [place], [place] [address]   Culture, respiratory (tracheal aspirate)     Status: None   Collection Time: 10/16/18  3:34 PM  Result Value Ref Range Status   Specimen Description TRACHEAL ASPIRATE  Final   Special Requests NONE  Final   Gram Stain   Final    FEW WBC PRESENT, PREDOMINANTLY PMN NO ORGANISMS SEEN    Culture   Final    FEW GROUP B STREP(S.AGALACTIAE)ISOLATED TESTING AGAINST S. AGALACTIAE NOT ROUTINELY PERFORMED DUE TO PREDICTABILITY OF AMP/PEN/VAN SUSCEPTIBILITY. Performed at [facility], [place] [address] [place], [place] [address]    Report Status 10/18/2018 FINAL  Final     Resolved Hospital Problem list     Assessment & Plan:  Acute hypoxic respiratory failure in setting of status asthmaticus. - suspect that there is also a component of upper airway disease/VCD contributing given presenting loud audible wheezing from bedside and heavy psychiatric history. Also consider crack cocaine as contributing factor. UDS was positive for this. Required deep sedation and  ultimately neuromuscular blockade 10/21.- 10/19/18   10/20/2018 - > - > does YES meet criteria for SBT/Extubation in setting of Acute Respiratory Failure due to ae-asthma and wheezing resolution but could not due to agiated encephalopathy    Plan PRVC Continue BD/steroids Continue diuresis Await echo result Advance ET tube    Acute metabolic +/- toxic encephalopathy, h/o bipolar disease and depression. H/o polysubstance abuse including crack cocaine: Presume hypoxia was the driving factor but has heavy h/o bipolar disease and depression.  - 10/20/2018 - agitated during [place] neurontin Continue seroquel Give haldol 5mg  IV  X 1 Holding home  zoloft Propofol, fentanyl, RASS goal - 3.  Might have to consider changing diprivan to precedxex   Anemia of chronic disease Plan  - PRBC for hgb </= 6.9gm%    - exceptions are   -  if ACS susepcted/confirmed then transfuse for hgb </= 8.0gm%,  or    -  active bleeding with hemodynamic instability, then transfuse regardless of hemoglobin value   At at all times try to transfuse 1 unit prbc as possible with exception of active hemorrhage    Hyperglycemia -likely to be exacerbated by steroids Plan SSI coverage   Possible acute bronchitis with strep b  - Ampicilling  Disposition / Summary of Today's Plan 10/20/18   conitnue ICU care     Diet: tubefeeds Pain/Anxiety/Delirium protocol (if indicated): PAD protocol diprivan, fentanyl,. RASS goal -3 VAP protocol (if indicated): yes DVT prophylaxis: [place] heparin  GI prophylaxis: H2B Hyperglycemia protocol: ordered Mobility: advance per RN Code Status: full code Family Communication: none at besdie 10/20/2018. Mom updated over phone 10/20/2018  ATTESTATION & SIGNATURE    The patient is critically ill with multiple organ systems failure and requires high complexity decision making for assessment and support, frequent evaluation and titration of therapies,  application of  advanced monitoring technologies and extensive interpretation of multiple databases.   Critical Care Time devoted to patient care services described in this note is  30  Minutes. This time reflects time of care of this signee Dr Brand Males. This critical care time does not reflect procedure time, or teaching time or supervisory time of PA/NP/Med student/Med Resident etc but could involve care discussion time     Dr. Brand Males, M.D., [facility].C.P Pulmonary and Critical Care Medicine Staff Physician [place] [facility] Pager: [phone], If no answer or between  15:00h - 7:00h: call [phone]  10/20/2018 10:28 AM

## 2018-10-20 NOTE — Plan of Care (Signed)
  Problem: Education: Goal: Knowledge of General Education information will improve Description Including pain rating scale, medication(s)/side effects and non-pharmacologic comfort measures Outcome: Not Progressing   Problem: Activity: Goal: Risk for activity intolerance will decrease Outcome: Not Progressing   Problem: Coping: Goal: Level of anxiety will decrease Outcome: Not Progressing   Problem: Activity: Goal: Ability to tolerate increased activity will improve Outcome: Not Progressing

## 2018-10-20 NOTE — Progress Notes (Signed)
RT advanced the ETT to 24 at the teeth from 22 at the teeth per MD order. Xray shows ETT 3.6cm above the carina prior to advancement.

## 2018-10-20 NOTE — Progress Notes (Signed)
25 cc Versed wasted in sink with Emer C., RN.

## 2018-10-21 ENCOUNTER — Inpatient Hospital Stay (HOSPITAL_COMMUNITY): Payer: Self-pay

## 2018-10-21 LAB — BASIC METABOLIC PANEL
Anion gap: 8 (ref 5–15)
BUN: 34 mg/dL — AB (ref 6–20)
CALCIUM: 9 mg/dL (ref 8.9–10.3)
CO2: 37 mmol/L — ABNORMAL HIGH (ref 22–32)
CREATININE: 0.78 mg/dL (ref 0.44–1.00)
Chloride: 93 mmol/L — ABNORMAL LOW (ref 98–111)
GFR calc Af Amer: >60 mL/min (ref >60)
GLUCOSE: 149 mg/dL — AB (ref 70–99)
Potassium: 4.3 mmol/L (ref 3.5–5.1)
Sodium: 138 mmol/L (ref 135–145)

## 2018-10-21 LAB — CBC WITH DIFFERENTIAL/PLATELET
Abs Immature Granulocytes: 0.15 10*3/uL — ABNORMAL HIGH (ref 0.00–0.07)
BASOS ABS: 0 10*3/uL (ref 0.0–0.1)
BASOS PCT: 0 %
EOS ABS: 0 10*3/uL (ref 0.0–0.5)
Eosinophils Relative: 0 %
HCT: 31.9 % — ABNORMAL LOW (ref 36.0–46.0)
Hemoglobin: 9.5 g/dL — ABNORMAL LOW (ref 12.0–15.0)
IMMATURE GRANULOCYTES: 1 %
LYMPHS ABS: 0.7 10*3/uL (ref 0.7–4.0)
Lymphocytes Relative: 5 %
MCH: 26.4 pg (ref 26.0–34.0)
MCHC: 29.8 g/dL — ABNORMAL LOW (ref 30.0–36.0)
MCV: 88.6 fL (ref 80.0–100.0)
Monocytes Absolute: 0.9 10*3/uL (ref 0.1–1.0)
Monocytes Relative: 7 %
NEUTROS ABS: 11.6 10*3/uL — AB (ref 1.7–7.7)
NEUTROS PCT: 87 %
NRBC: 0.2 % (ref 0.0–0.2)
PLATELETS: 339 10*3/uL (ref 150–400)
RBC: 3.6 MIL/uL — ABNORMAL LOW (ref 3.87–5.11)
RDW: 20 % — AB (ref 11.5–15.5)
WBC: 13.3 10*3/uL — ABNORMAL HIGH (ref 4.0–10.5)

## 2018-10-21 LAB — MAGNESIUM: Magnesium: 2.2 mg/dL (ref 1.7–2.4)

## 2018-10-21 LAB — GLUCOSE, CAPILLARY
GLUCOSE-CAPILLARY: 156 mg/dL — AB (ref 70–99)
GLUCOSE-CAPILLARY: 138 mg/dL — AB (ref 70–99)
Glucose-Capillary: 130 mg/dL — ABNORMAL HIGH (ref 70–99)
Glucose-Capillary: 131 mg/dL — ABNORMAL HIGH (ref 70–99)
Glucose-Capillary: 149 mg/dL — ABNORMAL HIGH (ref 70–99)
Glucose-Capillary: 152 mg/dL — ABNORMAL HIGH (ref 70–99)

## 2018-10-21 LAB — PHOSPHORUS: PHOSPHORUS: 4.7 mg/dL — AB (ref 2.5–4.6)

## 2018-10-21 LAB — TRIGLYCERIDES: TRIGLYCERIDES: 99 mg/dL (ref <150)

## 2018-10-21 MED ORDER — SERTRALINE HCL 100 MG PO TABS: 100.0000 mg | ORAL_TABLET | Freq: Every day | ORAL | Status: DC

## 2018-10-21 MED ORDER — SERTRALINE HCL 50 MG PO TABS
50.0000 mg | ORAL_TABLET | Freq: Every day | ORAL | Status: DC
  Administered 2018-10-21 – 2018-10-23 (×3): 50 mg
  Filled 2018-10-21: qty 2
  Filled 2018-10-21: qty 1
  Filled 2018-10-21: qty 2
  Filled 2018-10-21 (×2): qty 1

## 2018-10-21 NOTE — Progress Notes (Signed)
NAME:  Deborah Melendez, MRN:  [id number], DOB:  [birth year], LOS: 7 ADMISSION DATE:  10/14/2018, CONSULTATION DATE:  10/18 REFERRING MD:  Dolly Rias, CHIEF COMPLAINT:  Acute hypoxic respiratory failure    Brief History   42 year old female admitted on 10/18 w/ working dx of asthmatic exacerbation w/ worsening hypoxia and encephalopathy. Intubated  Past Medical History  Cocaine abuse, bipolar disease, depression, asthma   Significant Hospital Events   10/18 admitted, intubated 10/21 self extubated x 2 on same day, re-intubated. ABG worse. Deeply sedated and paralyzed.  10/22 hemoglobin low, plans to transfuse 1 unit. Plan to DC nimbex today.  10/23 -  Got vent dysnch on sedation wua. HR 130 and wheezing. Agitated on WUA. Hypetensive 10/24 - TF advanced yesterday. CK 106. Echo results pending. Not wheezing anymore after lasix. Agitated after WUA. RN not sure if patient vapes.d/w ,mom - "smokes cigs a lot and does drugs". Mom does NOT know if patient vapes but "likely smokes instead of vapes" . Mom needs to check with BF   ETT tube appears high   Procedures (surgical and bedside):  ETT 10/18 >>  Significant Diagnostic Tests:  CT angio 10/14/2018- no pulmonary embolus, right middle lobe collapse, bronchial wall thickening, bronchitis, mild basal atelectasis.   Chest x-ray 10/16/2018- endotracheal tube in stable position.  Linear atelectasis in the right.  I reviewed the images personally.  Urine drug screen 10/14/2018-cocaine, benzos, marojiuana (these are frequenbtly positive()  Micro Data:  HIV 10/19 - neg Sputum 10/20>>>group B strep Respiratory virus panel 10/8 > negative  Antimicrobials:  Azithro 10/18>>>10/23,. AMpicillin 10/23 >   SUBJECTIVE/OVERNIGHT/INTERVAL HX   10/21/2018 - Et tube low. Wheezy despite ET tube and diureses. EF 55%   Objective   Blood pressure 123/79, pulse (!) 102, temperature 99.7 F (37.6 C), resp. rate (!) 21, height 5\' 4"  (1.626 m),  weight 78.4 kg, SpO2 99 %.    Vent Mode: PRVC FiO2 (%):  [40 %] 40 % Set Rate:  [20 bmp] 20 bmp Vt Set:  [500 mL] 500 mL PEEP:  [5 cmH20] 5 cmH20 Plateau Pressure:  [18 cmH20-25 cmH20] 20 cmH20   Intake/Output Summary (Last 24 hours) at 10/21/2018 1109 Last data filed at 10/21/2018 1032 Gross per 24 hour  Intake 3513.46 ml  Output 5750 ml  Net -2236.54 ml   Filed Weights   10/19/18 0500 10/20/18 0500 10/21/18 0300  Weight: 84.4 kg 80.4 kg 78.4 kg   General Appearance:  Looks criticall ill OBESE - + Head:  Normocephalic, without obvious abnormality, atraumatic Eyes:  PERRL - yes, conjunctiva/corneas - clear     Ears:  Normal external ear canals, both ears Nose:  G tube - no Throat:  ETT TUBE - yes , OG tube - yes Neck:  Supple,  No enlargement/tenderness/nodules Lungs: WHEEZY, Ventilator   Synchrony - yes Heart:  S1 and S2 normal, no murmur, CVP - no.  Pressors - no Abdomen:  Soft, no masses, no organomegaly Genitalia / Rectal:  Not done Extremities:  Extremities- intact Skin:  ntact in exposed areas .  Neurologic:  Sedation - diprivan -> RASS - -3 . Moves all 4s - yes when agitated          LABS  CK 106  PULMONARY Recent Labs  Lab 10/17/18 0320 10/17/18 0916 10/17/18 1118 10/17/18 1418 10/18/18 0254  PHART 7.265* 7.379 7.244* 7.289* 7.347*  PCO2ART 59.8* 49.3* 74.6* 63.6* 58.3*  PO2ART 123.0* 118.0* 258.0* 148.0* 113.0*  HCO3 27.1  29.0* 32.1* 30.5* 31.9*  TCO2 29 30 34* 32 34*  O2SAT 98.0 98.0 100.0 99.0 98.0    CBC Recent Labs  Lab 10/19/18 0202 10/20/18 0425 10/21/18 0350  HGB 8.2* 9.0* 9.5*  HCT 26.5* 29.6* 31.9*  WBC 11.0* 10.3 13.3*  PLT 270 295 339    COAGULATION No results for input(s): INR in the last 168 hours.  CARDIAC  No results for input(s): TROPONINI in the last 168 hours. No results for input(s): PROBNP in the last 168 hours.   CHEMISTRY Recent Labs  Lab 10/16/18 0512 10/18/18 0223 10/19/18 0202 10/20/18 0113  10/20/18 0425 10/21/18 0350  NA 138 140 139 136  --  138  K 4.4 5.1 4.8 5.2*  --  4.3  CL 107 104 100 93*  --  93*  CO2 25 28 33* 36*  --  37*  GLUCOSE 138* 166* 127* 143*  --  149*  BUN 18 20 20  30*  --  34*  CREATININE 0.85 0.76 0.80 0.87  --  0.78  CALCIUM 8.2* 8.2* 8.8* 9.1  --  9.0  MG 2.6* 2.6* 2.3  --  2.1 2.2  PHOS 3.2 2.7 3.3  --  5.0* 4.7*   Estimated Creatinine Clearance: 93.8 mL/min (by C-G formula based on SCr of 0.78 mg/dL).   LIVER No results for input(s): AST, ALT, ALKPHOS, BILITOT, PROT, ALBUMIN, INR in the last 168 hours.   INFECTIOUS Recent Labs  Lab 10/14/18 1140  10/15/18 1150 10/16/18 0512 10/17/18 0941 10/20/18 0113  LATICACIDVEN 0.9  --   --   --   --  0.8  PROCALCITON  --    < > 0.11 <0.10 <0.10  --    < > = values in this interval not displayed.     ENDOCRINE CBG (last 3)  Recent Labs    10/20/18 2338 10/21/18 0355 10/21/18 0802  GLUCAP 151* 149* 152*         IMAGING x48h  - image(s) personally visualized  -   highlighted in bold Dg Chest Port 1 View  Result Date: 10/21/2018 CLINICAL DATA:  42 year old female with a history of endotracheal tube and respiratory failure EXAM: PORTABLE CHEST 1 VIEW COMPARISON:  10/20/2018 FINDINGS: Cardiomediastinal silhouette unchanged in size and contour. Endotracheal tube has become further advanced, now terminating just above the carina and directed towards the right mainstem. Unchanged left IJ central venous catheter, terminating superior vena cava. Unchanged gastric tube terminating out of the field of view. Lung volumes adequate with no confluent airspace disease large pleural effusion or pneumothorax. IMPRESSION: Well aerated lungs. Endotracheal tube terminates at the carina, and may be withdrawn 4-5 cm for better position. These results were called by telephone at the time of interpretation on 10/21/2018 at 10:52 am to nurse caring for the patient Ms Rosario Jacks. Unchanged left IJ central  catheter and gastric tube. Electronically Signed   By: Corrie Mckusick D.O.   On: 10/21/2018 10:53   Dg Chest Port 1 View  Result Date: 10/20/2018 CLINICAL DATA:  Evaluate endotracheal tube, respiratory failure EXAM: PORTABLE CHEST 1 VIEW COMPARISON:  Portable chest x-ray of 10/19/2018 and 10/18/2018 FINDINGS: The carina is very difficult to visualize with the patient is somewhat rotated, but the tip of the endotracheal tube appears to be approximately 3.6 cm above the carina. The lungs appear well aerated. Left IJ central venous line tip overlies the mid lower SVC. No pneumothorax is seen. The heart is within normal limits in size. IMPRESSION:  1. Carina is very difficult to visualize but the endotracheal tube tip appears to be approximately 3.6 cm above the carina. 2. Left IJ central venous line tip overlies the mid lower SVC. No pneumothorax. Electronically Signed   By: Ivar Drape M.D.   On: 10/20/2018 10:41     Recent Results (from the past 240 hour(s))  MRSA PCR Screening     Status: None   Collection Time: 10/14/18  2:19 PM  Result Value Ref Range Status   MRSA by PCR NEGATIVE NEGATIVE Final    Comment:        The GeneXpert MRSA Assay (FDA approved for NASAL specimens only), is one component of a comprehensive MRSA colonization surveillance program. It is not intended to diagnose MRSA infection nor to guide or monitor treatment for MRSA infections. Performed at [facility], [place] [address], [place], [place] [address]   Culture, respiratory (tracheal aspirate)     Status: None   Collection Time: 10/16/18  3:34 PM  Result Value Ref Range Status   Specimen Description TRACHEAL ASPIRATE  Final   Special Requests NONE  Final   Gram Stain   Final    FEW WBC PRESENT, PREDOMINANTLY PMN NO ORGANISMS SEEN    Culture   Final    FEW GROUP B STREP(S.AGALACTIAE)ISOLATED TESTING AGAINST S. AGALACTIAE NOT ROUTINELY PERFORMED DUE TO PREDICTABILITY OF AMP/PEN/VAN  SUSCEPTIBILITY. Performed at [facility], [place] [address] [place], [place] [address]    Report Status 10/18/2018 FINAL  Final     Resolved Hospital Problem list     Assessment & Plan:  Acute hypoxic respiratory failure in setting of status asthmaticus. - suspect that there is also a component of upper airway disease/VCD contributing given presenting loud audible wheezing from bedside and heavy psychiatric history. Also consider crack cocaine as contributing factor. UDS was positive for this. Required deep sedation and ultimately neuromuscular blockade 10/21.- 10/19/18   10/21/2018 - >  10/21/2018 - > does not meet criteria for SBT/Extubation in setting of Acute Respiratory Failure due to wheezing and agitated encephalopathy. EF 55%   Plan Retract et tube PRVC BD steroid    Acute metabolic +/- toxic encephalopathy, h/o bipolar disease and depression. H/o polysubstance abuse including crack cocaine: Presume hypoxia was the driving factor but has heavy h/o bipolar disease and depression.  - 10/21/2018 - agitated  Plan Hold neurontin Continue seroquel Restart home zoloft Propofol, fentanyl, RASS goal - 3.  Might have to consider changing diprivan to precedxex   Anemia of chronic disease Plan  - PRBC for hgb </= 6.9gm%    - exceptions are   -  if ACS susepcted/confirmed then transfuse for hgb </= 8.0gm%,  or    -  active bleeding with hemodynamic instability, then transfuse regardless of hemoglobin value   At at all times try to transfuse 1 unit prbc as possible with exception of active hemorrhage    Hyperglycemia -likely to be exacerbated by steroids Plan SSI coverage   Possible acute bronchitis with strep b  - Ampicillin - need to set stop date  Disposition / Summary of Today's Plan 10/21/18   Continue ICU care     Diet: tubefeeds Pain/Anxiety/Delirium protocol (if indicated): PAD protocol diprivan, fentanyl,. RASS goal -3 VAP protocol (if indicated):  yes DVT prophylaxis: [place] heparin  GI prophylaxis: H2B Hyperglycemia protocol: ordered Mobility: advance per RN Code Status: full code Family Communication: none at besdie 10/21/2018. Mom updated over phone 10/20/2018  ATTESTATION & SIGNATURE   The patient is critically ill with multiple organ systems failure and requires high complexity decision making for assessment and support, frequent evaluation and titration of therapies, application of advanced monitoring technologies and extensive interpretation of multiple databases.   Critical Care Time devoted to patient care services described in this note is  30  Minutes. This time reflects time of care of this signee Dr Brand Males. This critical care time does not reflect procedure time, or teaching time or supervisory time of PA/NP/Med student/Med Resident etc but could involve care discussion time     Dr. Brand Males, M.D., [facility].C.P Pulmonary and Critical Care Medicine Staff Physician [place] [facility] Pager: [phone], If no answer or between  15:00h - 7:00h: call [phone]  10/21/2018 11:28 AM

## 2018-10-21 NOTE — Progress Notes (Signed)
ETT pulled back 2cm at this time per MD order.  Now secured 22@ lip

## 2018-10-22 ENCOUNTER — Encounter (HOSPITAL_COMMUNITY): Payer: Self-pay

## 2018-10-22 DIAGNOSIS — G9340 Encephalopathy, unspecified: Secondary | ICD-10-CM

## 2018-10-22 LAB — BASIC METABOLIC PANEL
Anion gap: 8 (ref 5–15)
BUN: 36 mg/dL — ABNORMAL HIGH (ref 6–20)
CALCIUM: 8.6 mg/dL — AB (ref 8.9–10.3)
CHLORIDE: 95 mmol/L — AB (ref 98–111)
CO2: 36 mmol/L — ABNORMAL HIGH (ref 22–32)
CREATININE: 0.82 mg/dL (ref 0.44–1.00)
GFR calc non Af Amer: >60 mL/min (ref >60)
Glucose, Bld: 123 mg/dL — ABNORMAL HIGH (ref 70–99)
Potassium: 4.1 mmol/L (ref 3.5–5.1)
SODIUM: 139 mmol/L (ref 135–145)

## 2018-10-22 LAB — CBC WITH DIFFERENTIAL/PLATELET
Abs Immature Granulocytes: 0.17 10*3/uL — ABNORMAL HIGH (ref 0.00–0.07)
BASOS ABS: 0 10*3/uL (ref 0.0–0.1)
Basophils Relative: 0 %
EOS ABS: 0 10*3/uL (ref 0.0–0.5)
EOS PCT: 0 %
HCT: 30.7 % — ABNORMAL LOW (ref 36.0–46.0)
Hemoglobin: 9.4 g/dL — ABNORMAL LOW (ref 12.0–15.0)
Immature Granulocytes: 2 %
Lymphocytes Relative: 5 %
Lymphs Abs: 0.6 10*3/uL — ABNORMAL LOW (ref 0.7–4.0)
MCH: 26.9 pg (ref 26.0–34.0)
MCHC: 30.6 g/dL (ref 30.0–36.0)
MCV: 88 fL (ref 80.0–100.0)
Monocytes Absolute: 0.9 10*3/uL (ref 0.1–1.0)
Monocytes Relative: 8 %
NRBC: 0 % (ref 0.0–0.2)
Neutro Abs: 9.5 10*3/uL — ABNORMAL HIGH (ref 1.7–7.7)
Neutrophils Relative %: 85 %
Platelets: 280 10*3/uL (ref 150–400)
RBC: 3.49 MIL/uL — AB (ref 3.87–5.11)
RDW: 19.7 % — AB (ref 11.5–15.5)
WBC: 11.2 10*3/uL — AB (ref 4.0–10.5)

## 2018-10-22 LAB — GLUCOSE, CAPILLARY
GLUCOSE-CAPILLARY: 111 mg/dL — AB (ref 70–99)
GLUCOSE-CAPILLARY: 129 mg/dL — AB (ref 70–99)
GLUCOSE-CAPILLARY: 141 mg/dL — AB (ref 70–99)
GLUCOSE-CAPILLARY: 145 mg/dL — AB (ref 70–99)
Glucose-Capillary: 153 mg/dL — ABNORMAL HIGH (ref 70–99)
Glucose-Capillary: 156 mg/dL — ABNORMAL HIGH (ref 70–99)

## 2018-10-22 LAB — CK TOTAL AND CKMB (NOT AT ARMC)
CK, MB: 1.4 ng/mL (ref 0.5–5.0)
Relative Index: 0.6 (ref 0.0–2.5)
Total CK: 229 U/L (ref 38–234)

## 2018-10-22 LAB — PHOSPHORUS: Phosphorus: 4 mg/dL (ref 2.5–4.6)

## 2018-10-22 LAB — MAGNESIUM: MAGNESIUM: 2.3 mg/dL (ref 1.7–2.4)

## 2018-10-22 MED ORDER — CHLORHEXIDINE GLUCONATE 0.12 % MT SOLN
OROMUCOSAL | Status: AC
  Administered 2018-10-22: 15 mL via OROMUCOSAL
  Filled 2018-10-22: qty 15

## 2018-10-22 MED ORDER — ACETAMINOPHEN 160 MG/5ML PO SOLN
650.0000 mg | Freq: Four times a day (QID) | ORAL | Status: DC | PRN
  Administered 2018-10-22 – 2018-10-23 (×2): 650 mg
  Filled 2018-10-22 (×2): qty 20.3

## 2018-10-22 MED ORDER — LORAZEPAM 1 MG PO TABS
1.0000 mg | ORAL_TABLET | Freq: Two times a day (BID) | ORAL | Status: DC
  Administered 2018-10-23 – 2018-10-24 (×3): 1 mg
  Filled 2018-10-22 (×3): qty 1

## 2018-10-22 MED ORDER — FAMOTIDINE 40 MG/5ML PO SUSR
20.0000 mg | Freq: Two times a day (BID) | ORAL | Status: DC
  Administered 2018-10-23 – 2018-10-24 (×3): 20 mg
  Filled 2018-10-22 (×3): qty 2.5

## 2018-10-22 NOTE — Progress Notes (Signed)
Neuro: perrl, sluggish. +gag, + cough. Responds to voice, pain. Restless, agitated when weaning gtts. Propofol, fentanyl gtts.  LS dim, slight exp wheeze noted upper lung fields. Tolerating vent settings. Scant secretions. ST to 120's, MAP >65. No noted ectopy.  Bowel regimen given, hypoactive bs. TF @ goal, minimal residual <40.  Lasix given, foley >3000 out this shift.

## 2018-10-22 NOTE — Progress Notes (Signed)
eLink Physician-Brief Progress Note Patient Name: Deborah Melendez DOB: 11/23/1976 MRN: [id number]   Date of Service  10/22/2018  HPI/Events of Note  Fever to 101.1 F - Already on Ampicillin IV. AST and ALT both normal.   eICU Interventions  Will order: 1. Blood cultures X 2 now.  2. Tylenol 650 mg per tube Q 6 hours PRN Temp > 101.0 F.      Intervention Category Major Interventions: Infection - evaluation and management  Sommer,Steven Eugene 10/22/2018, 10:13 PM

## 2018-10-22 NOTE — Progress Notes (Signed)
NAME:  Deborah Melendez, MRN:  [id number], DOB:  09/30/1976, LOS: 8 ADMISSION DATE:  10/14/2018, CONSULTATION DATE:  10/18 REFERRING MD:  Dolly Rias, CHIEF COMPLAINT:  Acute hypoxic respiratory failure    Brief History   42 year old female admitted on 10/18 w/ working dx of asthmatic exacerbation w/ worsening hypoxia and encephalopathy. Intubated  Past Medical History  Cocaine abuse, bipolar disease, depression, asthma   Significant Hospital Events   10/18 admitted, intubated 10/21 self extubated x 2 on same day, re-intubated. ABG worse. Deeply sedated and paralyzed.  10/22 hemoglobin low, plans to transfuse 1 unit. Plan to DC nimbex today.  10/23 -  Got vent dysnch on sedation wua. HR 130 and wheezing. Agitated on WUA. Hypetensive 10/24 - TF advanced yesterday. CK 106. Echo results pending. Not wheezing anymore after lasix. Agitated after WUA. RN not sure if patient vapes.d/w ,mom - "smokes cigs a lot and does drugs". Mom does NOT know if patient vapes but "likely smokes instead of vapes" . Mom needs to check with BF. ETT tube appears high  10/25 - Et tube low. Wheezy despite ET tube and diureses. EF 55%   Procedures (surgical and bedside):  ETT 10/18 >>  Significant Diagnostic Tests:  CT angio 10/14/2018- no pulmonary embolus, right middle lobe collapse, bronchial wall thickening, bronchitis, mild basal atelectasis.   Chest x-ray 10/16/2018- endotracheal tube in stable position.  Linear atelectasis in the right.  I reviewed the images personally.  Urine drug screen 10/14/2018-cocaine, benzos, marojiuana (these are frequenbtly positive()  Micro Data:  HIV 10/19 - neg Sputum 10/20>>>group B strep Respiratory virus panel 10/8 > negative  Antimicrobials:  Azithro 10/18>>>10/23,. AMpicillin 10/23 >   SUBJECTIVE/OVERNIGHT/INTERVAL HX   10/22/2018 - no change. Still on vent. On diprivan  Objective   Blood pressure 110/72, pulse (!) 108, temperature (!) 100.6 F (38.1 C),  resp. rate 20, height 5\' 4"  (1.626 m), weight 78.7 kg, SpO2 97 %.    Vent Mode: PRVC FiO2 (%):  [40 %] 40 % Set Rate:  [20 bmp] 20 bmp Vt Set:  [500 mL] 500 mL PEEP:  [5 cmH20] 5 cmH20 Plateau Pressure:  [9 cmH20-18 cmH20] 17 cmH20   Intake/Output Summary (Last 24 hours) at 10/22/2018 1455 Last data filed at 10/22/2018 1400 Gross per 24 hour  Intake 5549.7 ml  Output 6975 ml  Net -1425.3 ml   Filed Weights   10/20/18 0500 10/21/18 0300 10/22/18 0500  Weight: 80.4 kg 78.4 kg 78.7 kg  General Appearance:  Looks criticall ill Head:  Normocephalic, without obvious abnormality, atraumatic Eyes:  PERRL - yes, conjunctiva/corneas - clear     Ears:  Normal external ear canals, both ears Nose:  G tube - no Throat:  ETT TUBE - yesy , OG tube - yes Neck:  Supple,  No enlargement/tenderness/nodules Lungs: Mild wheeeze +. But better wheezing Heart:  S1 and S2 normal, no murmur, CVP - no.  Pressors - no Abdomen:  Soft, no masses, no organomegaly Genitalia / Rectal:  Not done Extremities:  Extremities- intact Skin:  ntact in exposed areas . Sacral area - not examined Neurologic:  Sedation - diprivan gtt -> RASS - -3 . Moves all 4s - yes when agitate. CAM-ICU - gets agitated . Orientation - not oriented    LABS  CK 106  PULMONARY Recent Labs  Lab 10/17/18 0320 10/17/18 0916 10/17/18 1118 10/17/18 1418 10/18/18 0254  PHART 7.265* 7.379 7.244* 7.289* 7.347*  PCO2ART 59.8* 49.3* 74.6* 63.6* 58.3*  PO2ART 123.0* 118.0* 258.0* 148.0* 113.0*  HCO3 27.1 29.0* 32.1* 30.5* 31.9*  TCO2 29 30 34* 32 34*  O2SAT 98.0 98.0 100.0 99.0 98.0    CBC Recent Labs  Lab 10/20/18 0425 10/21/18 0350 10/22/18 0425  HGB 9.0* 9.5* 9.4*  HCT 29.6* 31.9* 30.7*  WBC 10.3 13.3* 11.2*  PLT 295 339 280    COAGULATION No results for input(s): INR in the last 168 hours.  CARDIAC  No results for input(s): TROPONINI in the last 168 hours. No results for input(s): PROBNP in the last 168  hours.   CHEMISTRY Recent Labs  Lab 10/18/18 0223 10/19/18 0202 10/20/18 0113 10/20/18 0425 10/21/18 0350 10/21/18 2342 10/22/18 0425  NA 140 139 136  --  138 139  --   K 5.1 4.8 5.2*  --  4.3 4.1  --   CL 104 100 93*  --  93* 95*  --   CO2 28 33* 36*  --  37* 36*  --   GLUCOSE 166* 127* 143*  --  149* 123*  --   BUN 20 20 30*  --  34* 36*  --   CREATININE 0.76 0.80 0.87  --  0.78 0.82  --   CALCIUM 8.2* 8.8* 9.1  --  9.0 8.6*  --   MG 2.6* 2.3  --  2.1 2.2  --  2.3  PHOS 2.7 3.3  --  5.0* 4.7*  --  4.0   Estimated Creatinine Clearance: 91.6 mL/min (by C-G formula based on SCr of 0.82 mg/dL).   LIVER No results for input(s): AST, ALT, ALKPHOS, BILITOT, PROT, ALBUMIN, INR in the last 168 hours.   INFECTIOUS Recent Labs  Lab 10/16/18 0512 10/17/18 0941 10/20/18 0113  LATICACIDVEN  --   --  0.8  PROCALCITON <0.10 <0.10  --      ENDOCRINE CBG (last 3)  Recent Labs    10/22/18 0330 10/22/18 0735 10/22/18 1107  GLUCAP 153* 145* 141*         IMAGING x48h  - image(s) personally visualized  -   highlighted in bold Dg Chest Port 1 View  Result Date: 10/21/2018 CLINICAL DATA:  42 year old female with a history of endotracheal tube and respiratory failure EXAM: PORTABLE CHEST 1 VIEW COMPARISON:  10/20/2018 FINDINGS: Cardiomediastinal silhouette unchanged in size and contour. Endotracheal tube has become further advanced, now terminating just above the carina and directed towards the right mainstem. Unchanged left IJ central venous catheter, terminating superior vena cava. Unchanged gastric tube terminating out of the field of view. Lung volumes adequate with no confluent airspace disease large pleural effusion or pneumothorax. IMPRESSION: Well aerated lungs. Endotracheal tube terminates at the carina, and may be withdrawn 4-5 cm for better position. These results were called by telephone at the time of interpretation on 10/21/2018 at 10:52 am to nurse caring for the  patient Ms Rosario Jacks. Unchanged left IJ central catheter and gastric tube. Electronically Signed   By: Corrie Mckusick D.O.   On: 10/21/2018 10:53     Recent Results (from the past 240 hour(s))  MRSA PCR Screening     Status: None   Collection Time: 10/14/18  2:19 PM  Result Value Ref Range Status   MRSA by PCR NEGATIVE NEGATIVE Final    Comment:        The GeneXpert MRSA Assay (FDA approved for NASAL specimens only), is one component of a comprehensive MRSA colonization surveillance program. It is not intended to diagnose MRSA infection  nor to guide or monitor treatment for MRSA infections. Performed at [facility], [place] [address], [place], [place] [address]   Culture, respiratory (tracheal aspirate)     Status: None   Collection Time: 10/16/18  3:34 PM  Result Value Ref Range Status   Specimen Description TRACHEAL ASPIRATE  Final   Special Requests NONE  Final   Gram Stain   Final    FEW WBC PRESENT, PREDOMINANTLY PMN NO ORGANISMS SEEN    Culture   Final    FEW GROUP B STREP(S.AGALACTIAE)ISOLATED TESTING AGAINST S. AGALACTIAE NOT ROUTINELY PERFORMED DUE TO PREDICTABILITY OF AMP/PEN/VAN SUSCEPTIBILITY. Performed at [facility], [place] [address], [place], [place] [address]    Report Status 10/18/2018 FINAL  Final     Resolved Hospital Problem list     Assessment & Plan:  Acute hypoxic respiratory failure in setting of status asthmaticus. - suspect that there is also a component of upper airway disease/VCD contributing given presenting loud audible wheezing from bedside and heavy psychiatric history. Also  crack cocaine as contributing factor. S/p deep sedation and ultimately neuromuscular blockade 10/21.- 10/19/18    10/22/2018 - > does not  meet criteria for SBT/Extubation in setting of Acute Respiratory Failure due to agitation/encephalopathy  Plan PRVC BD Steroids Singulair    Acute metabolic +/- toxic encephalopathy, h/o bipolar disease and  depression. H/o polysubstance abuse including crack cocaine: Presume hypoxia was the driving factor but has heavy h/o bipolar disease and depression.  - 10/22/2018 - agitated on WUA Plan Hold neurontin Continue seroquel Home zoloft Diprivan gtt, fent prn  Check lactate/ck 10/23/18 and if abnormal consider change diprivan to precedex   Anemia of chronic disease Plan  - PRBC for hgb </= 6.9gm%    - exceptions are   -  if ACS susepcted/confirmed then transfuse for hgb </= 8.0gm%,  or    -  active bleeding with hemodynamic instability, then transfuse regardless of hemoglobin value   At at all times try to transfuse 1 unit prbc as possible with exception of active hemorrhage    Hyperglycemia -likely to be exacerbated by steroids Plan SSI coverage   Possible acute bronchitis with strep b  - Ampicillin - need to set stop date  Disposition / Summary of Today's Plan 10/22/18   Continue ICU care     Diet: tubefeeds Pain/Anxiety/Delirium protocol (if indicated): PAD protocol diprivan, fentanyl,. RASS goal  o to -2 VAP protocol (if indicated): yes DVT prophylaxis: [place] heparin  GI prophylaxis: H2B Hyperglycemia protocol: ordered Mobility: advance per RN Code Status: full code Family Communication: none at besdie 10/22/2018. Mom updated over phone 10/20/2018      ATTESTATION & SIGNATURE   The patient [facility] is critically ill with multiple organ systems failure and requires high complexity decision making for assessment and support, frequent evaluation and titration of therapies, application of advanced monitoring technologies and extensive interpretation of multiple databases.   Critical Care Time devoted to patient care services described in this note is  30  Minutes. This time reflects time of care of this signee Dr Brand Males. This critical care time does not reflect procedure time, or teaching time or supervisory time of PA/NP/Med student/Med Resident  etc but could involve care discussion time     Dr. Brand Males, M.D., [facility].C.P Pulmonary and Critical Care Medicine Staff Physician [place] Pulmonary and Critical Care Pager: [phone], If no answer or between  15:00h - 7:00h:  call [phone]  10/22/2018 3:13 PM

## 2018-10-23 ENCOUNTER — Inpatient Hospital Stay (HOSPITAL_COMMUNITY): Payer: Self-pay

## 2018-10-23 LAB — MAGNESIUM: Magnesium: 2.3 mg/dL (ref 1.7–2.4)

## 2018-10-23 LAB — BASIC METABOLIC PANEL
ANION GAP: 7 (ref 5–15)
BUN: 41 mg/dL — ABNORMAL HIGH (ref 6–20)
CALCIUM: 9 mg/dL (ref 8.9–10.3)
CHLORIDE: 96 mmol/L — AB (ref 98–111)
CO2: 35 mmol/L — ABNORMAL HIGH (ref 22–32)
CREATININE: 0.94 mg/dL (ref 0.44–1.00)
GFR calc non Af Amer: >60 mL/min (ref >60)
Glucose, Bld: 176 mg/dL — ABNORMAL HIGH (ref 70–99)
Potassium: 4 mmol/L (ref 3.5–5.1)
SODIUM: 138 mmol/L (ref 135–145)

## 2018-10-23 LAB — HEPATIC FUNCTION PANEL
ALBUMIN: 3 g/dL — AB (ref 3.5–5.0)
ALT: 44 U/L (ref 0–44)
AST: 24 U/L (ref 15–41)
Alkaline Phosphatase: 52 U/L (ref 38–126)
Bilirubin, Direct: 0.1 mg/dL (ref 0.0–0.2)
Indirect Bilirubin: 0.4 mg/dL (ref 0.3–0.9)
TOTAL PROTEIN: 6.7 g/dL (ref 6.5–8.1)
Total Bilirubin: 0.5 mg/dL (ref 0.3–1.2)

## 2018-10-23 LAB — PHOSPHORUS: Phosphorus: 3.2 mg/dL (ref 2.5–4.6)

## 2018-10-23 LAB — CBC WITH DIFFERENTIAL/PLATELET
ABS IMMATURE GRANULOCYTES: 0.19 10*3/uL — AB (ref 0.00–0.07)
Basophils Absolute: 0 10*3/uL (ref 0.0–0.1)
Basophils Relative: 0 %
EOS ABS: 0 10*3/uL (ref 0.0–0.5)
Eosinophils Relative: 0 %
HEMATOCRIT: 31.3 % — AB (ref 36.0–46.0)
Hemoglobin: 9.5 g/dL — ABNORMAL LOW (ref 12.0–15.0)
IMMATURE GRANULOCYTES: 2 %
LYMPHS ABS: 0.7 10*3/uL (ref 0.7–4.0)
Lymphocytes Relative: 6 %
MCH: 26.8 pg (ref 26.0–34.0)
MCHC: 30.4 g/dL (ref 30.0–36.0)
MCV: 88.4 fL (ref 80.0–100.0)
MONO ABS: 1 10*3/uL (ref 0.1–1.0)
MONOS PCT: 9 %
NEUTROS ABS: 9.4 10*3/uL — AB (ref 1.7–7.7)
NEUTROS PCT: 83 %
Platelets: 284 10*3/uL (ref 150–400)
RBC: 3.54 MIL/uL — ABNORMAL LOW (ref 3.87–5.11)
RDW: 19.4 % — ABNORMAL HIGH (ref 11.5–15.5)
WBC: 11.3 10*3/uL — ABNORMAL HIGH (ref 4.0–10.5)
nRBC: 0 % (ref 0.0–0.2)

## 2018-10-23 LAB — GLUCOSE, CAPILLARY
GLUCOSE-CAPILLARY: 165 mg/dL — AB (ref 70–99)
Glucose-Capillary: 140 mg/dL — ABNORMAL HIGH (ref 70–99)
Glucose-Capillary: 143 mg/dL — ABNORMAL HIGH (ref 70–99)
Glucose-Capillary: 136 mg/dL — ABNORMAL HIGH (ref 70–99)
Glucose-Capillary: 152 mg/dL — ABNORMAL HIGH (ref 70–99)
Glucose-Capillary: 136 mg/dL — ABNORMAL HIGH (ref 70–99)

## 2018-10-23 LAB — LACTIC ACID, PLASMA: Lactic Acid, Venous: 1.1 mmol/L (ref 0.5–1.9)

## 2018-10-23 MED ORDER — HALOPERIDOL LACTATE 5 MG/ML IJ SOLN
2.0000 mg | INTRAMUSCULAR | Status: DC | PRN
  Administered 2018-10-23: 2 mg via INTRAVENOUS
  Filled 2018-10-23: qty 1

## 2018-10-23 MED ORDER — HALOPERIDOL LACTATE 5 MG/ML IJ SOLN
INTRAMUSCULAR | Status: AC
  Administered 2018-10-23: 5 mg via INTRAVENOUS
  Filled 2018-10-23: qty 1

## 2018-10-23 MED ORDER — MIDAZOLAM HCL 2 MG/2ML IJ SOLN
1.0000 mg | INTRAMUSCULAR | Status: DC | PRN
  Administered 2018-10-23: 4 mg via INTRAVENOUS

## 2018-10-23 MED ORDER — DEXMEDETOMIDINE HCL IN NACL 400 MCG/100ML IV SOLN
0.4000 ug/kg/h | INTRAVENOUS | Status: DC
  Administered 2018-10-23: 1.2 ug/kg/h via INTRAVENOUS
  Administered 2018-10-23: 1.6 ug/kg/h via INTRAVENOUS
  Administered 2018-10-23: 0.4 ug/kg/h via INTRAVENOUS
  Administered 2018-10-24: 1.6 ug/kg/h via INTRAVENOUS
  Administered 2018-10-24: 1.7 ug/kg/h via INTRAVENOUS
  Administered 2018-10-24 (×2): 1.6 ug/kg/h via INTRAVENOUS
  Filled 2018-10-23 (×7): qty 100

## 2018-10-23 MED ORDER — HALOPERIDOL LACTATE 5 MG/ML IJ SOLN
5.0000 mg | Freq: Once | INTRAMUSCULAR | Status: AC
  Administered 2018-10-23: 5 mg via INTRAVENOUS

## 2018-10-23 MED ORDER — MIDAZOLAM HCL 2 MG/2ML IJ SOLN
INTRAMUSCULAR | Status: AC
  Administered 2018-10-23: 4 mg via INTRAVENOUS
  Filled 2018-10-23: qty 4

## 2018-10-23 NOTE — Progress Notes (Signed)
eLink Physician-Brief Progress Note Patient Name: Deborah Melendez DOB: 12/06/1976 MRN: [id number]   Date of Service  10/23/2018  HPI/Events of Note  Delirium - QTc interval = 0.37 seconds.   eICU Interventions  Will order: 1. Haldol 2 mg IV Q 3 hours PRN delirium      Intervention Category Major Interventions: Delirium, psychosis, severe agitation - evaluation and management  [name] 10/23/2018, 8:12 PM

## 2018-10-23 NOTE — Progress Notes (Signed)
25 mL Fentanyl wasted in med room sink with Griffin Dakin RN.

## 2018-10-23 NOTE — Progress Notes (Signed)
eLink Physician-Brief Progress Note Patient Name: Romina Divirgilio DOB: [birth year] MRN: [id number]   Date of Service  10/23/2018  HPI/Events of Note  Delirium - Patient attempting to sit up and get out of bed.   eICU Interventions  Will order: 1. Increase ceiling on Precedex IV infusion to 1.7 mcg/kg/hour. 2. Monitor QTc interval Q 6 hours. Notify MD if QTc interval > 500 milliseconds.      Intervention Category Major Interventions: Delirium, psychosis, severe agitation - evaluation and management  Enora Trillo Eugene 10/23/2018, 7:19 PM

## 2018-10-23 NOTE — Progress Notes (Signed)
NAME:  Deborah Melendez, MRN:  [id number], DOB:  04/20/1976, LOS: 9 ADMISSION DATE:  10/14/2018, CONSULTATION DATE:  10/18 REFERRING MD:  Dolly Rias, CHIEF COMPLAINT:  Acute hypoxic respiratory failure    Brief History   42 year old female admitted on 10/18 w/ working dx of asthmatic exacerbation w/ worsening hypoxia and encephalopathy. Intubated  Past Medical History  Cocaine abuse, bipolar disease, depression, asthma   Significant Hospital Events   10/18 admitted, intubated 10/21 self extubated x 2 on same day, re-intubated. ABG worse. Deeply sedated and paralyzed.  10/22 hemoglobin low, plans to transfuse 1 unit. Plan to DC nimbex today.  10/23 -  Got vent dysnch on sedation wua. HR 130 and wheezing. Agitated on WUA. Hypetensive 10/24 - TF advanced yesterday. CK 106. Echo results pending. Not wheezing anymore after lasix. Agitated after WUA. RN not sure if patient vapes.d/w ,mom - "smokes cigs a lot and does drugs". Mom does NOT know if patient vapes but "likely smokes instead of vapes" . Mom needs to check with BF. ETT tube appears high  10/25 - Et tube low. Wheezy despite ET tube and diureses. EF 55% 10/26 fent gtt/diprivan gtt -> agitation barrier to wean  Procedures (surgical and bedside):  ETT 10/18 >>  Significant Diagnostic Tests:  CT angio 10/14/2018- no pulmonary embolus, right middle lobe collapse, bronchial wall thickening, bronchitis, mild basal atelectasis.   Chest x-ray 10/16/2018- endotracheal tube in stable position.  Linear atelectasis in the right.  I reviewed the images personally.  Urine drug screen 10/14/2018-cocaine, benzos, marojiuana (these are frequenbtly positive()  Micro Data:  HIV 10/19 - neg Sputum 10/20>>>group B strep Respiratory virus panel 10/8 > negative  Antimicrobials:  Azithro 10/18>>>10/23,. AMpicillin 10/23 >   SUBJECTIVE/OVERNIGHT/INTERVAL HX   10/23/2018 - agitated on wua with diprivan and fent gtt. Boyfriend Miguel at bedside  - denies vaping but admits to crack, MJ and tobacco smoking.   Objective   Blood pressure 112/72, pulse 94, temperature 99.5 F (37.5 C), resp. rate 20, height 5\' 4"  (1.626 m), weight 71.7 kg, SpO2 100 %.    Vent Mode: PRVC FiO2 (%):  [40 %] 40 % Set Rate:  [20 bmp] 20 bmp Vt Set:  [500 mL] 500 mL PEEP:  [5 cmH20] 5 cmH20 Plateau Pressure:  [14 cmH20-22 cmH20] 14 cmH20   Intake/Output Summary (Last 24 hours) at 10/23/2018 1348 Last data filed at 10/23/2018 1300 Gross per 24 hour  Intake 3225 ml  Output 5240 ml  Net -2015 ml   Filed Weights   10/21/18 0300 10/22/18 0500 10/23/18 0500  Weight: 78.4 kg 78.7 kg 71.7 kg  General Appearance:  Looks criticall ill Head:  Normocephalic, without obvious abnormality, atraumatic Eyes:  PERRL - yes, conjunctiva/corneas - clear     Ears:  Normal external ear canals, both ears Nose:  G tube - no Throat:  ETT TUBE - yes , OG tube - no Neck:  Supple,  No enlargement/tenderness/nodules Lungs: Clear to auscultation bilaterally, Ventilator   Synchrony - yes. NO wheeze Heart:  S1 and S2 normal, no murmur, CVP - no.  Pressors - no Abdomen:  Soft, no masses, no organomegaly Genitalia / Rectal:  Not done Extremities:  Extremities- intact Skin:  ntact in exposed areas .  Neurologic:  Sedation - diprivan gtt and fent gtt -> RASS - -2 . Moves all 4s - yes. CAM-ICU - positive . Orientation - not oriented but Kittie Plater says she recognized him      Resolved  Hospital Problem list     Assessment & Plan:  Acute hypoxic respiratory failure in setting of status asthmaticus. - suspect that there is also a component of upper airway disease/VCD contributing given presenting loud audible wheezing from bedside and heavy psychiatric history. Also  crack cocaine as contributing factor. S/p deep sedation and ultimately neuromuscular blockade 10/21.- 10/19/18  10/23/2018 - > does not meet criteria for SBT/Extubation in setting of Acute Respiratory Failure due to  agitated encephalopathy   Plan PRVC BD Steroids singulair    Acute metabolic +/- toxic encephalopathy, h/o bipolar disease and depression. H/o polysubstance abuse including crack cocaine: Presume hypoxia was the driving factor but has heavy h/o bipolar disease and depression.  - 10/23/2018 - agitation on WUA continues, CK 229 on diprivan  Plan Hold neurontin Continue seroquel Home zoloft fent gtt Change diprivan to precedex gtt Check ekg for qtc and if ok can do haldol prn RASS goal 0 to 02   Anemia of chronic disease Plan  - PRBC for hgb </= 6.9gm%    - exceptions are   -  if ACS susepcted/confirmed then transfuse for hgb </= 8.0gm%,  or    -  active bleeding with hemodynamic instability, then transfuse regardless of hemoglobin value   At at all times try to transfuse 1 unit prbc as possible with exception of active hemorrhage    Hyperglycemia -likely to be exacerbated by steroids Plan SSI coverage   Possible acute bronchitis with strep b  - dc ampicillinm  Disposition / Summary of Today's Plan 10/23/18   Agitation barrier to wean. Change diprivan to precedex. Check ekg for qtc to consider added haldol. Continue fent gtt    Diet: tubefeeds Pain/Anxiety/Delirium protocol (if indicated): PAD protocol diprivan, fentanyl,. RASS goal  o to -2 VAP protocol (if indicated): yes DVT prophylaxis:  heparin  GI prophylaxis: H2B Hyperglycemia protocol: ordered Mobility: advance per RN Code Status: full code Family Communication: boyfriend Miguel at bedside -  10/23/2018. Mom updated over phone 10/20/2018       ATTESTATION & SIGNATURE   The patient [facility] is critically ill with multiple organ systems failure and requires high complexity decision making for assessment and support, frequent evaluation and titration of therapies, application of advanced monitoring technologies and extensive interpretation of multiple databases.   Critical Care  Time devoted to patient care services described in this note is  30  Minutes. This time reflects time of care of this signee Dr Brand Males. This critical care time does not reflect procedure time, or teaching time or supervisory time of PA/NP/Med student/Med Resident etc but could involve care discussion time     Dr. Brand Males, M.D., [facility].C.P Pulmonary and Critical Care Medicine Staff Physician [place] [facility]monary and Critical Care Pager: [phone], If no answer or between  15:00h - 7:00h: call [phone]  10/23/2018 1:54 PM   LABS    PULMONARY Recent Labs  Lab 10/17/18 0320 10/17/18 0916 10/17/18 1118 10/17/18 1418 10/18/18 0254  PHART 7.265* 7.379 7.244* 7.289* 7.347*  PCO2ART 59.8* 49.3* 74.6* 63.6* 58.3*  PO2ART 123.0* 118.0* 258.0* 148.0* 113.0*  HCO3 27.1 29.0* 32.1* 30.5* 31.9*  TCO2 29 30 34* 32 34*  O2SAT 98.0 98.0 100.0 99.0 98.0    CBC Recent Labs  Lab 10/21/18 0350 10/22/18 0425 10/23/18 0407  HGB 9.5* 9.4* 9.5*  HCT 31.9* 30.7* 31.3*  WBC 13.3* 11.2* 11.3*  PLT 339 280 284  COAGULATION No results for input(s): INR in the last 168 hours.  CARDIAC  No results for input(s): TROPONINI in the last 168 hours. No results for input(s): PROBNP in the last 168 hours.   CHEMISTRY Recent Labs  Lab 10/19/18 0202 10/20/18 0113 10/20/18 0425 10/21/18 0350 10/21/18 2342 10/22/18 0425 10/23/18 0407  NA 139 136  --  138 139  --  138  K 4.8 5.2*  --  4.3 4.1  --  4.0  CL 100 93*  --  93* 95*  --  96*  CO2 33* 36*  --  37* 36*  --  35*  GLUCOSE 127* 143*  --  149* 123*  --  176*  BUN 20 30*  --  34* 36*  --  41*  CREATININE 0.80 0.87  --  0.78 0.82  --  0.94  CALCIUM 8.8* 9.1  --  9.0 8.6*  --  9.0  MG 2.3  --  2.1 2.2  --  2.3 2.3  PHOS 3.3  --  5.0* 4.7*  --  4.0 3.2   Estimated Creatinine Clearance: 76.5 mL/min (by C-G formula based on SCr of 0.94 mg/dL).   LIVER Recent Labs  Lab 10/23/18 0407  AST 24    ALT 44  ALKPHOS 52  BILITOT 0.5  PROT 6.7  ALBUMIN 3.0*     INFECTIOUS Recent Labs  Lab 10/17/18 0941 10/20/18 0113 10/23/18 0407  LATICACIDVEN  --  0.8 1.1  PROCALCITON <0.10  --   --      ENDOCRINE CBG (last 3)  Recent Labs    10/23/18 0425 10/23/18 0801 10/23/18 1131  GLUCAP 165* 140* 143*         IMAGING x48h  - image(s) personally visualized  -   highlighted in bold Dg Chest Port 1 View  Result Date: 10/23/2018 CLINICAL DATA:  Intubated. EXAM: PORTABLE CHEST 1 VIEW COMPARISON:  10/21/2018. FINDINGS: The endotracheal tube has been repositioned and is in satisfactory position now. Stable left jugular catheter with its tip in the superior vena cava. Normal sized heart and clear lungs. Stable mild peribronchial thickening. Unremarkable bones. IMPRESSION: 1. Endotracheal tube in satisfactory position. 2. Stable mild chronic bronchitic changes. Electronically Signed   By: Claudie Revering M.D.   On: 10/23/2018 10:11

## 2018-10-23 NOTE — Progress Notes (Deleted)
Admitted Pt from [facility] ED, when going through the pts belongings he mentioned that he had a pocket knife, we searched his pockets and the rest of his belongings and could not locate it. RN called [facility] to see if they could locate the knife. RN called back to check and see if it was found and Franki Cabot RN) from [place] ED said that they looked for it and could not find it. Other valuables sent to safe.

## 2018-10-24 ENCOUNTER — Inpatient Hospital Stay (HOSPITAL_COMMUNITY): Payer: Self-pay

## 2018-10-24 LAB — CBC WITH DIFFERENTIAL/PLATELET
Abs Immature Granulocytes: 0.15 10*3/uL — ABNORMAL HIGH (ref 0.00–0.07)
BASOS ABS: 0 10*3/uL (ref 0.0–0.1)
Basophils Relative: 0 %
EOS ABS: 0 10*3/uL (ref 0.0–0.5)
EOS PCT: 0 %
HEMATOCRIT: 35.2 % — AB (ref 36.0–46.0)
Hemoglobin: 10.4 g/dL — ABNORMAL LOW (ref 12.0–15.0)
Immature Granulocytes: 1 %
LYMPHS ABS: 0.8 10*3/uL (ref 0.7–4.0)
LYMPHS PCT: 7 %
MCH: 26.1 pg (ref 26.0–34.0)
MCHC: 29.5 g/dL — AB (ref 30.0–36.0)
MCV: 88.2 fL (ref 80.0–100.0)
MONO ABS: 0.9 10*3/uL (ref 0.1–1.0)
MONOS PCT: 7 %
NEUTROS PCT: 85 %
Neutro Abs: 10.1 10*3/uL — ABNORMAL HIGH (ref 1.7–7.7)
Platelets: 269 10*3/uL (ref 150–400)
RBC: 3.99 MIL/uL (ref 3.87–5.11)
RDW: 19.3 % — ABNORMAL HIGH (ref 11.5–15.5)
WBC: 11.8 10*3/uL — AB (ref 4.0–10.5)
nRBC: 0 % (ref 0.0–0.2)

## 2018-10-24 LAB — GLUCOSE, CAPILLARY
GLUCOSE-CAPILLARY: 105 mg/dL — AB (ref 70–99)
GLUCOSE-CAPILLARY: 127 mg/dL — AB (ref 70–99)
GLUCOSE-CAPILLARY: 185 mg/dL — AB (ref 70–99)
Glucose-Capillary: 146 mg/dL — ABNORMAL HIGH (ref 70–99)
Glucose-Capillary: 180 mg/dL — ABNORMAL HIGH (ref 70–99)
Glucose-Capillary: 124 mg/dL — ABNORMAL HIGH (ref 70–99)
Glucose-Capillary: 110 mg/dL — ABNORMAL HIGH (ref 70–99)

## 2018-10-24 LAB — PHOSPHORUS: PHOSPHORUS: 3.1 mg/dL (ref 2.5–4.6)

## 2018-10-24 LAB — BASIC METABOLIC PANEL
Anion gap: 6 (ref 5–15)
BUN: 44 mg/dL — AB (ref 6–20)
CO2: 33 mmol/L — ABNORMAL HIGH (ref 22–32)
CREATININE: 0.8 mg/dL (ref 0.44–1.00)
Calcium: 9.2 mg/dL (ref 8.9–10.3)
Chloride: 100 mmol/L (ref 98–111)
Glucose, Bld: 178 mg/dL — ABNORMAL HIGH (ref 70–99)
POTASSIUM: 3.8 mmol/L (ref 3.5–5.1)
SODIUM: 139 mmol/L (ref 135–145)

## 2018-10-24 LAB — MAGNESIUM: Magnesium: 2.1 mg/dL (ref 1.7–2.4)

## 2018-10-24 LAB — LACTIC ACID, PLASMA: LACTIC ACID, VENOUS: 1.1 mmol/L (ref 0.5–1.9)

## 2018-10-24 MED ORDER — MONTELUKAST SODIUM 10 MG PO TABS
10.0000 mg | ORAL_TABLET | Freq: Every day | ORAL | Status: DC
  Administered 2018-10-24 – 2018-10-27 (×4): 10 mg via ORAL
  Filled 2018-10-24 (×4): qty 1

## 2018-10-24 MED ORDER — GABAPENTIN 250 MG/5ML PO SOLN
100.0000 mg | Freq: Two times a day (BID) | ORAL | Status: DC
  Administered 2018-10-24: 100 mg
  Filled 2018-10-24 (×3): qty 2

## 2018-10-24 MED ORDER — METHYLPREDNISOLONE SODIUM SUCC 125 MG IJ SOLR: 60.0000 mg | Freq: Two times a day (BID) | INTRAMUSCULAR | Status: DC

## 2018-10-24 MED ORDER — QUETIAPINE FUMARATE 300 MG PO TABS
300.0000 mg | ORAL_TABLET | Freq: Every day | ORAL | Status: DC
  Administered 2018-10-24: 300 mg via ORAL
  Filled 2018-10-24: qty 3

## 2018-10-24 MED ORDER — METHYLPREDNISOLONE SODIUM SUCC 125 MG IJ SOLR
60.0000 mg | Freq: Every day | INTRAMUSCULAR | Status: DC
  Administered 2018-10-24 – 2018-10-25 (×2): 60 mg via INTRAVENOUS
  Filled 2018-10-24 (×2): qty 2

## 2018-10-24 MED ORDER — FUROSEMIDE 10 MG/ML IJ SOLN
40.0000 mg | Freq: Two times a day (BID) | INTRAMUSCULAR | Status: DC
  Administered 2018-10-25: 40 mg via INTRAVENOUS
  Filled 2018-10-24: qty 4

## 2018-10-24 MED ORDER — SERTRALINE HCL 50 MG PO TABS
50.0000 mg | ORAL_TABLET | Freq: Every day | ORAL | Status: DC
  Administered 2018-10-24 – 2018-10-27 (×4): 50 mg via ORAL
  Filled 2018-10-24 (×4): qty 1

## 2018-10-24 MED ORDER — LORAZEPAM 1 MG PO TABS
1.0000 mg | ORAL_TABLET | Freq: Two times a day (BID) | ORAL | Status: DC
  Administered 2018-10-24 – 2018-10-25 (×2): 1 mg via ORAL
  Filled 2018-10-24 (×2): qty 1

## 2018-10-24 MED ORDER — SENNOSIDES 8.8 MG/5ML PO SYRP
5.0000 mL | ORAL_SOLUTION | Freq: Two times a day (BID) | ORAL | Status: DC | PRN
  Filled 2018-10-24: qty 5

## 2018-10-24 MED ORDER — GABAPENTIN 250 MG/5ML PO SOLN
100.0000 mg | Freq: Two times a day (BID) | ORAL | Status: DC
  Administered 2018-10-24: 100 mg via ORAL
  Filled 2018-10-24: qty 2

## 2018-10-24 MED ORDER — ORAL CARE MOUTH RINSE
15.0000 mL | Freq: Two times a day (BID) | OROMUCOSAL | Status: DC
  Administered 2018-10-24 – 2018-10-27 (×2): 15 mL via OROMUCOSAL

## 2018-10-24 MED ORDER — ACETAMINOPHEN 160 MG/5ML PO SOLN
650.0000 mg | Freq: Four times a day (QID) | ORAL | Status: DC | PRN
  Administered 2018-10-26 – 2018-10-28 (×2): 650 mg via ORAL
  Filled 2018-10-24: qty 2030

## 2018-10-24 MED ORDER — DOCUSATE SODIUM 50 MG/5ML PO LIQD
100.0000 mg | Freq: Two times a day (BID) | ORAL | Status: DC | PRN
  Filled 2018-10-24: qty 10

## 2018-10-24 MED ORDER — FAMOTIDINE 40 MG/5ML PO SUSR
20.0000 mg | Freq: Two times a day (BID) | ORAL | Status: DC
  Administered 2018-10-24: 20 mg via ORAL
  Filled 2018-10-24 (×2): qty 2.5

## 2018-10-24 MED ORDER — POTASSIUM CHLORIDE 20 MEQ/15ML (10%) PO SOLN
40.0000 meq | Freq: Every day | ORAL | Status: DC
  Administered 2018-10-24: 40 meq via ORAL
  Filled 2018-10-24: qty 30

## 2018-10-24 MED ORDER — CLONAZEPAM 0.1 MG/ML ORAL SUSPENSION: 1.0000 mg | Freq: Two times a day (BID) | ORAL | Status: DC

## 2018-10-24 MED ORDER — POLYETHYLENE GLYCOL 3350 17 G PO PACK
17.0000 g | PACK | Freq: Every day | ORAL | Status: DC
  Administered 2018-10-24 – 2018-10-26 (×2): 17 g via ORAL
  Filled 2018-10-24 (×5): qty 1

## 2018-10-24 NOTE — Procedures (Signed)
Extubation Procedure Note  Patient Details:   Name: Shiela Bruns DOB: 14-Sep-1976 MRN: [id number]   Airway Documentation:    Vent end date: 10/24/18 Vent end time: 1445   Evaluation  O2 sats: stable throughout Complications: No apparent complications Patient did tolerate procedure well. Bilateral Breath Sounds: Clear, Diminished   Yes   Patient extubated per order to 4L [place] with no apparent complications. Positive cuff leak was noted prior to extubation. Patient is alert and oriented and is able to weakly speak, though voice is hoarse. Vitals are stable. RT will continue to monitor.   Irvin Lizama Clyda Greener 10/24/2018, 2:53 PM

## 2018-10-24 NOTE — Plan of Care (Signed)
  Problem: Clinical Measurements: Goal: Ability to maintain clinical measurements within normal limits will improve Outcome: Progressing Goal: Respiratory complications will improve Outcome: Progressing Goal: Cardiovascular complication will be avoided Outcome: Progressing   Problem: Nutrition: Goal: Adequate nutrition will be maintained Outcome: Progressing Note:  Pt on tube feeds at 79ml/hr   Problem: Safety: Goal: Ability to remain free from injury will improve Outcome: Progressing   Problem: Skin Integrity: Goal: Risk for impaired skin integrity will decrease Outcome: Progressing

## 2018-10-24 NOTE — Progress Notes (Signed)
150 mL of fentanyl wasted down sink with Nelva Bush, Therapist, sports.

## 2018-10-24 NOTE — Progress Notes (Addendum)
42 year old woman with status asthmaticus admitted 10/18 in the setting of cocaine THC use. She required mechanical ventilation and transient paralysis. Bronchospasm has now resolved and she is on percent "5/5 with good tidal volumes. She was encephalopathic and required Precedex but this is controlled her agitation, her Seroquel and Zoloft have been resumed.  She will be extubated to nasal cannula. She will be n.p.o. until swallow evaluation is performed.  She will need aggressive PT for severe deconditioning. Steroids will be decreased to 60 every 24, bronchodilators will be continued We will continue Precedex for encephalopathy and agitation and then wean to off post extubation. We will continue diuresis with Lasix but drop the dose to 40 every 12 and supplement potassium as needed.  My independent critical care time x 41m  Ho Parisi V. Elsworth Soho MD

## 2018-10-24 NOTE — Progress Notes (Signed)
eLink Physician-Brief Progress Note Patient Name: Deborah Melendez DOB: 09/29/1976 MRN: [id number]   Date of Service  10/24/2018  HPI/Events of Note  Patient extubated and able to swallow pills per nursing. Request to change per tube meds to PO.   eICU Interventions  Per tube meds all changed to PO.        Sommer,Steven Cornelia Copa 10/24/2018, 10:36 PM

## 2018-10-24 NOTE — Progress Notes (Signed)
Nutrition Follow-up  DOCUMENTATION CODES:   Not applicable  INTERVENTION:   Magic cup TID with meals, each supplement provides 290 kcal and 9 grams of protein   NUTRITION DIAGNOSIS:   Inadequate oral intake related to inability to eat as evidenced by NPO status.  Being addressed as diet advanced post extubation, supplements  GOAL:   Patient will meet greater than or equal to 90% of their needs  Progressing  MONITOR:   Vent status, Labs, Diet advancement, I & O's, TF tolerance, Weight trends  REASON FOR ASSESSMENT:   Consult Enteral/tube feeding initiation and management  ASSESSMENT:   Patient with PMH significant for asthma, bipolar 1 disorder, depression, cocaine abuse, MDD, and suicidal ideation. Presents this admission with asthmatic exacerbation and VCD with worsening hypoxia/encephalopathy (intuabated in ED).   10/18 admitted, intubated  10/21 self extubated x2, deeply sedated and paralyzed, re-intubated 10/22 nimbex d/c, TF re-started  10/28 Extubated, TF discontinued 10/29 FEES, Diet advanced to Dysphagia II, Nectar Thick  No po intake yet, diet just advanced. TF off  Weight continues to trend down; current wt 69 kg. Net negative >3 L since admission  BMI currently 26 post diuresis; pt does not meet criteria for obesity unspecified. Removed from documentation codes   Labs: potassium 3.0 (L), phosphorus 2.2 (L), Creatinine wdl Meds: lasix  Diet Order:   Diet Order            DIET DYS 2 Room service appropriate? Yes; Fluid consistency: Nectar Thick  Diet effective now              EDUCATION NEEDS:   Not appropriate for education at this time  Skin:  Skin Assessment: Reviewed RN Assessment  Last BM:  10/28  Height:   Ht Readings from Last 1 Encounters:  10/14/18 5\' 4"  (1.626 m)    Weight:   Wt Readings from Last 1 Encounters:  10/25/18 69 kg    Ideal Body Weight:  54.5 kg  BMI:  Body mass index is 26.11 kg/m.  Estimated  Nutritional Needs:   Kcal:  1760 kcal  Protein:  100-120 grams  Fluid:  >/= 1.7 L/day   Kerman Passey MS, RD, LDN, CNSC [phone] Pager  [phone] Weekend/On-Call Pager

## 2018-10-24 NOTE — Progress Notes (Signed)
NAME:  Deborah Melendez, MRN:  [id number], DOB:  1976-12-06, LOS: 59 ADMISSION DATE:  10/14/2018, CONSULTATION DATE:  10/18 REFERRING MD:  Dolly Rias, CHIEF COMPLAINT:  Acute hypoxic respiratory failure    Brief History   42 year old female admitted on 10/18 w/ working dx of asthmatic exacerbation w/ worsening hypoxia and encephalopathy. Intubated, required paralytics for ventilator synchrony. UDS positive for THC, cocaine & benzos.   Past Medical History  Cocaine abuse, bipolar disease, depression, asthma   Significant Hospital Events   10/18  Admitted, intubated 10/21  Self extubated x 2 on same day, re-intubated. ABG worse. Deeply sedated and paralyzed.  10/22  Hemoglobin low, Transfuse 1 unit. Nimbex stopped.  10/23  Got vent dysnch on sedation wua. HR 130 and wheezing. Agitated on WUA. Hypetensive 10/24  Wheezing improved after lasix.  Tolerating TF.  Mother indicates she smokes "a lot" 10/25  ETT low. Wheezy despite ET tube and diureses. EF 55% 10/26  Fent gtt/diprivan gtt -> agitation barrier to wean  Procedures (surgical and bedside):  ETT 10/18 >>  Significant Diagnostic Tests:  CTA Chest 10/18 >> no pulmonary embolus, right middle lobe collapse, bronchial wall thickening, bronchitis, mild basal atelectasis.  UDS 10/18 >> cocaine, benzos, marojiuana  Micro Data:  HIV 10/19 >> neg Sputum 10/20 >> group B strep Respiratory virus panel 10/8 >> negative  Antimicrobials:  Azithro 10/18 >> 10/23 Ampicillin 10/23 >> 10/27   SUBJECTIVE:  RN reports no BM, no respiratory effort on wean this am, fentanyl gtt turned off.  Remains on precedex. Fever improved.   Objective   Blood pressure 122/87, pulse 82, temperature 98.6 F (37 C), temperature source Rectal, resp. rate 20, height 5\' 4"  (1.626 m), weight 72.6 kg, SpO2 100 %.    Vent Mode: PRVC FiO2 (%):  [40 %] 40 % Set Rate:  [20 bmp] 20 bmp Vt Set:  [500 mL] 500 mL PEEP:  [5 cmH20] [place] Pressure:  [16  cmH20-22 cmH20] 16 cmH20   Intake/Output Summary (Last 24 hours) at 10/24/2018 1152 Last data filed at 10/24/2018 1100 Gross per 24 hour  Intake 3059.7 ml  Output 4430 ml  Net -1370.3 ml   Filed Weights   10/22/18 0500 10/23/18 0500 10/24/18 0500  Weight: 78.7 kg 71.7 kg 72.6 kg   General: young adult female lying in bed on vent in NAD HEENT: MM pink/moist, ETT Neuro: sedate, opens eyes to voice and drifts back to sleep  CV: s1s2 rrr, no m/r/g PULM: even/non-labored, lungs bilaterally clear without wheeze  JO:INOM, non-tender, bsx4 active  Extremities: warm/dry, no edema  Skin: no rashes or lesions  Resolved Hospital Problem list     Assessment & Plan:   Acute Hypoxemic Respiratory Failure in setting of Status Asthmaticus  -UDS positive for cocaine, THC, benzos -sputum positive for group B strep, completed abx 10/27 P: Continue PRVC 8cc/kg  Wean FiO2 / PEEP for sats >90% Continue singulair Reduce Solumedrol to 60mg  IV Q12 Duoneb Q4 Pulmicort BID  Acute Bronchitis with Group B Strep P: Monitor off abx, completed 76/72   Acute Metabolic +/- Toxic Encephalopathy Hx Bipolar Disorder, Depression  Hx Polysubstance Abuse  P: RASS Goal: 0 to -1  Continue Seroquel, 300 mg QHS  Follow QTc Ativan 1mg  BID Resume home zoloft if QTc within acceptable limits  Resume home neurontin at reduced dose  Adjust fentanyl gtt ceiling   Anemia of Chronic Disease P: Trend CBC  Transfuse per ICU guidelines   Hyperglycemia -  exacerbated by steroids  P: CBG with SSI    Disposition / Summary of Today's Plan 10/24/18   Agitation barrier to wean. Change diprivan to precedex. Check ekg for qtc to consider added haldol. Fentanyl gtt as needed.  Trial WUA, hopeful for extubation.  Ativan may be too sedating.      Diet: tubefeeds Pain/Anxiety/Delirium protocol (if indicated): PAD protocol  VAP protocol (if indicated): yes DVT prophylaxis: [place] heparin  GI prophylaxis:  H2B Hyperglycemia protocol: ordered Mobility: Bed rest / as tolerated  Code Status: full code Family Communication:No family available at bedside am 10/28.    LABS    PULMONARY Recent Labs  Lab 10/17/18 1418 10/18/18 0254  PHART 7.289* 7.347*  PCO2ART 63.6* 58.3*  PO2ART 148.0* 113.0*  HCO3 30.5* 31.9*  TCO2 32 34*  O2SAT 99.0 98.0    CBC Recent Labs  Lab 10/22/18 0425 10/23/18 0407 10/24/18 0415  HGB 9.4* 9.5* 10.4*  HCT 30.7* 31.3* 35.2*  WBC 11.2* 11.3* 11.8*  PLT 280 284 269    COAGULATION No results for input(s): INR in the last 168 hours.  CARDIAC  No results for input(s): TROPONINI in the last 168 hours. No results for input(s): PROBNP in the last 168 hours.   CHEMISTRY Recent Labs  Lab 10/20/18 0113 10/20/18 0425 10/21/18 0350 10/21/18 2342 10/22/18 0425 10/23/18 0407 10/24/18 0415  NA 136  --  138 139  --  138 139  K 5.2*  --  4.3 4.1  --  4.0 3.8  CL 93*  --  93* 95*  --  96* 100  CO2 36*  --  37* 36*  --  35* 33*  GLUCOSE 143*  --  149* 123*  --  176* 178*  BUN 30*  --  34* 36*  --  41* 44*  CREATININE 0.87  --  0.78 0.82  --  0.94 0.80  CALCIUM 9.1  --  9.0 8.6*  --  9.0 9.2  MG  --  2.1 2.2  --  2.3 2.3 2.1  PHOS  --  5.0* 4.7*  --  4.0 3.2 3.1   Estimated Creatinine Clearance: 90.4 mL/min (by C-G formula based on SCr of 0.8 mg/dL).   LIVER Recent Labs  Lab 10/23/18 0407  AST 24  ALT 44  ALKPHOS 52  BILITOT 0.5  PROT 6.7  ALBUMIN 3.0*     INFECTIOUS Recent Labs  Lab 10/20/18 0113 10/23/18 0407 10/24/18 0415  LATICACIDVEN 0.8 1.1 1.1     ENDOCRINE CBG (last 3)  Recent Labs    10/23/18 2358 10/24/18 0350 10/24/18 1119  GLUCAP 127* 146* 180*     IMAGING   Dg Chest Port 1 View  Result Date: 10/24/2018 CLINICAL DATA:  Check endotracheal tube placement EXAM: PORTABLE CHEST 1 VIEW COMPARISON:  10/23/2018 FINDINGS: Cardiac shadows within normal limits., Endotracheal tube, nasogastric catheter left jugular  central line are again seen and stable. The lungs are clear. No bony abnormality is noted. IMPRESSION: No active disease. Electronically Signed   By: Inez Catalina M.D.   On: 10/24/2018 08:39   Dg Chest Port 1 View  Result Date: 10/23/2018 CLINICAL DATA:  Intubated. EXAM: PORTABLE CHEST 1 VIEW COMPARISON:  10/21/2018. FINDINGS: The endotracheal tube has been repositioned and is in satisfactory position now. Stable left jugular catheter with its tip in the superior vena cava. Normal sized heart and clear lungs. Stable mild peribronchial thickening. Unremarkable bones. IMPRESSION: 1. Endotracheal tube in satisfactory position. 2. Stable mild  chronic bronchitic changes. Electronically Signed   By: Claudie Revering M.D.   On: 10/23/2018 10:11    Noe Gens, NP-C [facility] Pgr: [phone] or if no answer [phone] 10/24/2018, 11:53 AM

## 2018-10-25 DIAGNOSIS — G931 Anoxic brain damage, not elsewhere classified: Secondary | ICD-10-CM

## 2018-10-25 LAB — CBC WITH DIFFERENTIAL/PLATELET
Abs Immature Granulocytes: 0.29 10*3/uL — ABNORMAL HIGH (ref 0.00–0.07)
Basophils Absolute: 0 10*3/uL (ref 0.0–0.1)
Basophils Relative: 0 %
EOS ABS: 0 10*3/uL (ref 0.0–0.5)
EOS PCT: 0 %
HEMATOCRIT: 37.7 % (ref 36.0–46.0)
HEMOGLOBIN: 11.3 g/dL — AB (ref 12.0–15.0)
IMMATURE GRANULOCYTES: 1 %
LYMPHS ABS: 3.1 10*3/uL (ref 0.7–4.0)
LYMPHS PCT: 14 %
MCH: 26.7 pg (ref 26.0–34.0)
MCHC: 30 g/dL (ref 30.0–36.0)
MCV: 89.1 fL (ref 80.0–100.0)
Monocytes Absolute: 1.4 10*3/uL — ABNORMAL HIGH (ref 0.1–1.0)
Monocytes Relative: 6 %
NEUTROS PCT: 79 %
NRBC: 0 % (ref 0.0–0.2)
Neutro Abs: 16.8 10*3/uL — ABNORMAL HIGH (ref 1.7–7.7)
Platelets: 279 10*3/uL (ref 150–400)
RBC: 4.23 MIL/uL (ref 3.87–5.11)
RDW: 19.6 % — AB (ref 11.5–15.5)
WBC: 21.6 10*3/uL — ABNORMAL HIGH (ref 4.0–10.5)

## 2018-10-25 LAB — GLUCOSE, CAPILLARY
GLUCOSE-CAPILLARY: 119 mg/dL — AB (ref 70–99)
GLUCOSE-CAPILLARY: 141 mg/dL — AB (ref 70–99)
Glucose-Capillary: 181 mg/dL — ABNORMAL HIGH (ref 70–99)
Glucose-Capillary: 114 mg/dL — ABNORMAL HIGH (ref 70–99)
Glucose-Capillary: 119 mg/dL — ABNORMAL HIGH (ref 70–99)

## 2018-10-25 LAB — BASIC METABOLIC PANEL
ANION GAP: 10 (ref 5–15)
BUN: 43 mg/dL — AB (ref 6–20)
CHLORIDE: 102 mmol/L (ref 98–111)
CO2: 29 mmol/L (ref 22–32)
Calcium: 9.4 mg/dL (ref 8.9–10.3)
Creatinine, Ser: 0.81 mg/dL (ref 0.44–1.00)
GFR calc Af Amer: >60 mL/min (ref >60)
GFR calc non Af Amer: >60 mL/min (ref >60)
Glucose, Bld: 117 mg/dL — ABNORMAL HIGH (ref 70–99)
Potassium: 3 mmol/L — ABNORMAL LOW (ref 3.5–5.1)
SODIUM: 141 mmol/L (ref 135–145)

## 2018-10-25 LAB — MAGNESIUM: Magnesium: 2.2 mg/dL (ref 1.7–2.4)

## 2018-10-25 LAB — PHOSPHORUS: Phosphorus: 2.2 mg/dL — ABNORMAL LOW (ref 2.5–4.6)

## 2018-10-25 MED ORDER — POTASSIUM PHOSPHATES 15 MMOLE/5ML IV SOLN
20.0000 mmol | Freq: Once | INTRAVENOUS | Status: AC
  Administered 2018-10-25: 20 mmol via INTRAVENOUS
  Filled 2018-10-25: qty 6.67

## 2018-10-25 MED ORDER — FUROSEMIDE 10 MG/ML IJ SOLN
40.0000 mg | Freq: Every day | INTRAMUSCULAR | Status: DC
  Administered 2018-10-26 – 2018-10-27 (×2): 40 mg via INTRAVENOUS
  Filled 2018-10-25 (×2): qty 4

## 2018-10-25 MED ORDER — IPRATROPIUM-ALBUTEROL 0.5-2.5 (3) MG/3ML IN SOLN
3.0000 mL | Freq: Three times a day (TID) | RESPIRATORY_TRACT | Status: DC
  Administered 2018-10-26 – 2018-10-28 (×6): 3 mL via RESPIRATORY_TRACT
  Filled 2018-10-25 (×9): qty 3

## 2018-10-25 MED ORDER — FAMOTIDINE 20 MG PO TABS
20.0000 mg | ORAL_TABLET | Freq: Two times a day (BID) | ORAL | Status: DC
  Administered 2018-10-25 – 2018-10-27 (×6): 20 mg via ORAL
  Filled 2018-10-25 (×7): qty 1

## 2018-10-25 MED ORDER — POTASSIUM CHLORIDE CRYS ER 20 MEQ PO TBCR
40.0000 meq | EXTENDED_RELEASE_TABLET | Freq: Every day | ORAL | Status: DC
  Administered 2018-10-25 – 2018-10-27 (×3): 40 meq via ORAL
  Filled 2018-10-25 (×3): qty 2

## 2018-10-25 MED ORDER — CHLORHEXIDINE GLUCONATE 0.12 % MT SOLN
15.0000 mL | Freq: Two times a day (BID) | OROMUCOSAL | Status: DC
  Administered 2018-10-27 (×2): 15 mL via OROMUCOSAL
  Filled 2018-10-25 (×5): qty 15

## 2018-10-25 MED ORDER — RESOURCE THICKENUP CLEAR PO POWD
ORAL | Status: DC | PRN
  Administered 2018-10-25 – 2018-10-27 (×3): via ORAL
  Filled 2018-10-25: qty 125

## 2018-10-25 MED ORDER — METHYLPREDNISOLONE SODIUM SUCC 40 MG IJ SOLR
40.0000 mg | Freq: Every day | INTRAMUSCULAR | Status: DC
  Administered 2018-10-26 – 2018-10-27 (×2): 40 mg via INTRAVENOUS
  Filled 2018-10-25 (×2): qty 1

## 2018-10-25 MED ORDER — QUETIAPINE FUMARATE 300 MG PO TABS
300.0000 mg | ORAL_TABLET | Freq: Every day | ORAL | Status: DC
  Administered 2018-10-25 – 2018-10-26 (×2): 300 mg
  Filled 2018-10-25 (×2): qty 1

## 2018-10-25 MED ORDER — ORAL CARE MOUTH RINSE
15.0000 mL | Freq: Two times a day (BID) | OROMUCOSAL | Status: DC
  Administered 2018-10-26 – 2018-10-27 (×3): 15 mL via OROMUCOSAL

## 2018-10-25 MED ORDER — RESOURCE THICKENUP CLEAR PO POWD
ORAL | Status: DC | PRN
  Filled 2018-10-25: qty 1500

## 2018-10-25 MED ORDER — GABAPENTIN 100 MG PO CAPS
100.0000 mg | ORAL_CAPSULE | Freq: Two times a day (BID) | ORAL | Status: DC
  Administered 2018-10-25 – 2018-10-27 (×6): 100 mg via ORAL
  Filled 2018-10-25 (×7): qty 1

## 2018-10-25 NOTE — Progress Notes (Signed)
[facility] Progress Note Patient Name: Deborah Melendez DOB: 1976/08/19 MRN: [id number]   Date of Service  10/25/2018  HPI/Events of Note  K+ = 3.0, PO4--- = 2.2 and Creatinine = 0.81.   eICU Interventions  Will replace K+ and PO4---.      Intervention Category Major Interventions: Electrolyte abnormality - evaluation and management  [name] 10/25/2018, 5:54 AM

## 2018-10-25 NOTE — Evaluation (Signed)
Physical Therapy Evaluation Patient Details Name: Deborah Melendez MRN: [id number] DOB: [birth year] Today's Date: 10/25/2018   History of Present Illness  Pt adm asthmatic exacerbation w/ worsening hypoxia and encephalopathy. UDS positive for THC, cocaine & benzos. Pt intubated 10/18. Self extubated x 2 on 10/21. Reintubated. Extubated 10/28. PMH - bipolar, asthma  Clinical Impression  Pt admitted with above diagnosis and presents to PT with functional limitations due to deficits listed below (See PT problem list). Pt needs skilled PT to maximize independence and safety to allow discharge to CIR. Pt with extended hospitalization and expect she will make good progress toward return to independence.      Follow Up Recommendations CIR    Equipment Recommendations  Other (comment)(To be determined)    Recommendations for Other Services       Precautions / Restrictions Precautions Precautions: Fall Restrictions Weight Bearing Restrictions: No      Mobility  Bed Mobility Overal bed mobility: Needs Assistance Bed Mobility: Supine to Sit     Supine to sit: Mod assist     General bed mobility comments: Assist to move legs off of bed, elevate trunk into sitting, and bring hips to the EOB.  Transfers Overall transfer level: Needs assistance Equipment used: Ambulation equipment used Transfers: Sit to/from [facility] Sit to Stand: +2 physical assistance;Mod assist Stand pivot transfers: (Used Stedy to transfer from bed to recliner)       General transfer comment: Assist to bring hips and trunk up and for balance. Assist to place hands on bar of Stedy to grip. Pt needs extra time to initiate movement  Ambulation/Gait             General Gait Details: Unable  Stairs            Wheelchair Mobility    Modified Rankin (Stroke Patients Only)       Balance Overall balance assessment: Needs assistance Sitting-balance support: Bilateral  upper extremity supported;Feet supported Sitting balance-Leahy Scale: Poor Sitting balance - Comments: Sat EOB x 7-8 minutes with min guard to min assist Postural control: Posterior lean Standing balance support: Bilateral upper extremity supported Standing balance-Leahy Scale: Poor Standing balance comment: Stedy and mod assist for static standing. Stood x 2 for 5-10 sec                             Pertinent Vitals/Pain Pain Assessment: Faces Faces Pain Scale: Hurts even more Pain Intervention(s): Monitored during session    Home Living Family/patient expects to be discharged to:: Private residence Living Arrangements: Spouse/significant other Available Help at Discharge: Family;Available PRN/intermittently(husband works during the day) Type of Home: Apartment Home Access: Stairs to enter Entrance Stairs-Rails: Right Entrance Stairs-Number of Steps: 3 Home Layout: One level Home Equipment: None      Prior Function Level of Independence: Independent               Hand Dominance   Dominant Hand: Right    Extremity/Trunk Assessment   Upper Extremity Assessment Upper Extremity Assessment: Defer to OT evaluation    Lower Extremity Assessment Lower Extremity Assessment: RLE deficits/detail;LLE deficits/detail RLE Deficits / Details: grossly 2+-3-/5 LLE Deficits / Details: grossly 2+-3-/5    Cervical / Trunk Assessment Cervical / Trunk Assessment: Other exceptions(weakness)  Communication   Communication: Other (comment)(soft voice)  Cognition Arousal/Alertness: Awake/alert Behavior During Therapy: Flat affect Overall Cognitive Status: Impaired/Different from baseline Area of Impairment: Orientation;Memory;Following commands;Safety/judgement;Problem solving  Orientation Level: Disoriented to;Time;Situation   Memory: Decreased short-term memory Following Commands: Follows one step commands with increased time Safety/Judgement:  Decreased awareness of deficits;Decreased awareness of safety   Problem Solving: Slow processing;Decreased initiation;Difficulty sequencing;Requires verbal cues;Requires tactile cues        General Comments      Exercises     Assessment/Plan    PT Assessment Patient needs continued PT services  PT Problem List Decreased strength;Decreased activity tolerance;Decreased balance;Decreased mobility;Decreased cognition;Decreased knowledge of use of DME;Decreased safety awareness       PT Treatment Interventions DME instruction;Gait training;Functional mobility training;Therapeutic activities;Therapeutic exercise;Balance training;Cognitive remediation;Patient/family education    PT Goals (Current goals can be found in the Care Plan section)  Acute Rehab PT Goals Patient Stated Goal: not stated PT Goal Formulation: With patient Time For Goal Achievement: 11/08/18 Potential to Achieve Goals: Good    Frequency Min 3X/week   Barriers to discharge Inaccessible home environment;Decreased caregiver support stairs to enter and husband works    Co-evaluation               AM-PAC PT "6 Clicks" Daily Activity  Outcome Measure Difficulty turning over in bed (including adjusting bedclothes, sheets and blankets)?: Unable Difficulty moving from lying on back to sitting on the side of the bed? : Unable Difficulty sitting down on and standing up from a chair with arms (e.g., wheelchair, bedside commode, etc,.)?: Unable Help needed moving to and from a bed to chair (including a wheelchair)?: Total Help needed walking in hospital room?: Total Help needed climbing 3-5 steps with a railing? : Total 6 Click Score: 6    End of Session Equipment Utilized During Treatment: Gait belt;Oxygen Activity Tolerance: Patient limited by fatigue Patient left: in chair;with call bell/phone within reach;with chair alarm set;with nursing/sitter in room Nurse Communication: Mobility status;Need for lift  equipment PT Visit Diagnosis: Unsteadiness on feet (R26.81);Other abnormalities of gait and mobility (R26.89);Muscle weakness (generalized) (M62.81)    Time: 1000-1029 PT Time Calculation (min) (ACUTE ONLY): 29 min   Charges:   PT Evaluation $PT Eval Moderate Complexity: 1 Mod PT Treatments $Therapeutic Activity: 8-22 mins        [place] Pager [phone] Office [place] 10/25/2018, 11:42 AM

## 2018-10-25 NOTE — Evaluation (Signed)
Clinical/Bedside Swallow Evaluation Patient Details  Name: Deborah Melendez MRN: [id number] Date of Birth: [birth year]  Today's Date: 10/25/2018 Time: SLP Start Time (ACUTE ONLY): 0816 SLP Stop Time (ACUTE ONLY): 0833 SLP Time Calculation (min) (ACUTE ONLY): 17 min  Past Medical History:  Past Medical History:  Diagnosis Date  . Bipolar 1 disorder, depressed, severe ([place]) 02/04/2017  . Cocaine abuse with cocaine-induced mood disorder ([place]) 07/27/2017  . Depression   . Homicidal ideations   . Manic behavior ([place])   . MDD (major depressive disorder), recurrent severe, without psychosis ([place]) 08/08/2015  . Substance or medication-induced bipolar and related disorder with onset during intoxication ([place]) 09/08/2016  . Suicidal ideation    Past Surgical History:  Past Surgical History:  Procedure Laterality Date  . FINGER SURGERY     HPI:  Pt is a 42 y.o. female admitted on 10/18 with working dx of asthmatic exacerbation, VCD with worsening hypoxia and encephalopathy. She had recent admission and d/c about 1 week prior after being admitted for acute bronchitis and asthmatic exacerbation. PMH significant for cocaine abuse, bipolar disease, depression, asthma. Intubated 10/18-10/28 (self extubated X2 on 10/18). Per MD note 10/19, suspect component of upper airway disease/VCD contributing given presenting loud audible wheezing from bedside and heavy psychiatric history. CXR = no active disease.   Assessment / Plan / Recommendation Clinical Impression  Pt was alert and cooperative throughout bedside swallow evaluation, oriented to self and place. She presented with aphonic, breathy vocal quality characteristic of incomplete vocal fold adduction as well as baseline breathy cough. Following oral care, pt consumed small sips thin with mild anterior loss and immediate coughing noted X3, concerning for aspiration. Given bites of puree, pt also displayed delayed cough X1, however pt denies throat  tenderness/soreness during swallow. Considering current clinical presentation and prolonged intubation during this admission, pt is currently a severe aspiation risk and would benefit from objective swallow testing. Recommend pt continue NPO until FEES completed this morning (scheduled for 9:00).   SLP Visit Diagnosis: Dysphagia, unspecified (R13.10)    Aspiration Risk  Severe aspiration risk    Diet Recommendation NPO        Other  Recommendations Oral Care Recommendations: Oral care QID   Follow up Recommendations Other (comment)(TBD)      Frequency and Duration            Prognosis Prognosis for Safe Diet Advancement: Good      Swallow Study   General HPI: Pt is a 42 y.o. female admitted on 10/18 with working dx of asthmatic exacerbation, VCD with worsening hypoxia and encephalopathy. She had recent admission and d/c about 1 week prior after being admitted for acute bronchitis and asthmatic exacerbation. PMH significant for cocaine abuse, bipolar disease, depression, asthma. Intubated 10/18-10/28 (self extubated X2 on 10/18). Per MD note 10/19, suspect component of upper airway disease/VCD contributing given presenting loud audible wheezing from bedside and heavy psychiatric history. CXR = no active disease. Type of Study: Bedside Swallow Evaluation Previous Swallow Assessment: none found in chart Diet Prior to this Study: NPO Temperature Spikes Noted: No Respiratory Status: Nasal cannula History of Recent Intubation: Yes Length of Intubations (days): 10 days Date extubated: 10/24/18 Behavior/Cognition: Alert;Cooperative;Requires cueing Oral Cavity Assessment: Other (comment)(thick saliva/white coated tongue) Oral Care Completed by SLP: Yes Oral Cavity - Dentition: Missing dentition;Adequate natural dentition Vision: Functional for self-feeding Self-Feeding Abilities: Needs assist Patient Positioning: Upright in bed Baseline Vocal Quality: Low vocal  intensity;Aphonic;Hoarse Volitional Cough: Weak Volitional Swallow: Able to  elicit    Oral/Motor/Sensory Function Overall Oral Motor/Sensory Function: Mild impairment Facial ROM: Within Functional Limits Facial Symmetry: Within Functional Limits Lingual ROM: Reduced left;Reduced right;Other (Comment)(reduced anterior protrusion) Lingual Symmetry: Within Functional Limits Velum: Within Functional Limits Mandible: Within Functional Limits   Ice Chips Ice chips: Not tested   Thin Liquid Thin Liquid: Impaired Presentation: Cup Oral Phase Impairments: (none) Oral Phase Functional Implications: Right anterior spillage;Left anterior spillage Pharyngeal  Phase Impairments: Suspected delayed Swallow;Cough - Immediate    Nectar Thick Nectar Thick Liquid: Not tested   Honey Thick Honey Thick Liquid: Not tested   Puree Puree: Impaired Presentation: Spoon Oral Phase Impairments: (none) Oral Phase Functional Implications: (none) Pharyngeal Phase Impairments: Throat Clearing - Delayed   Solid     Solid: Not tested     Deborah Melendez, Student SLP  Deborah Melendez 10/25/2018,9:23 AM

## 2018-10-25 NOTE — Progress Notes (Signed)
NAME:  Deborah Melendez, MRN:  [id number], DOB:  July 22, 1976, LOS: 61 ADMISSION DATE:  10/14/2018, CONSULTATION DATE:  10/18 REFERRING MD:  Dolly Rias, CHIEF COMPLAINT:  Acute hypoxic respiratory failure    Brief History   42 year old female admitted on 10/18 w/ working dx of asthmatic exacerbation w/ worsening hypoxia and encephalopathy. Intubated, required paralytics for ventilator synchrony. UDS positive for THC, cocaine & benzos.   Past Medical History  Cocaine abuse, bipolar disease, depression, asthma   Significant Hospital Events   10/18  Admitted, intubated 10/21  Self extubated x 2 on same day, re-intubated. ABG worse. Deeply sedated and paralyzed.  10/22  Hemoglobin low, Transfuse 1 unit. Nimbex stopped.  10/23  Got vent dysnch on sedation wua. HR 130 and wheezing. Agitated on WUA. Hypetensive 10/24  Wheezing improved after lasix.  Tolerating TF.  Mother indicates she smokes "a lot" 10/25  ETT low. Wheezy despite ET tube and diureses. EF 55% 10/26  Fent gtt/diprivan gtt -> agitation barrier to wean 10/28>> Extubated  Procedures (surgical and bedside):  ETT 10/18 >> 10/28  Significant Diagnostic Tests:  CTA Chest 10/18 >> no pulmonary embolus, right middle lobe collapse, bronchial wall thickening, bronchitis, mild basal atelectasis.  UDS 10/18 >> cocaine, benzos, marojiuana  Micro Data:  HIV 10/19 >> neg Sputum 10/20 >> group B strep Respiratory virus panel 10/8 >> negative  Antimicrobials:  Azithro 10/18 >> 10/23 Ampicillin 10/23 >> 10/27   SUBJECTIVE:  Stable on 2.5 L [place] OOB in chair, eating Dysphagia II diet  Objective   Blood pressure 112/87, pulse 95, temperature 98.1 F (36.7 C), resp. rate (!) 26, height 5\' 4"  (1.626 m), weight 69 kg, SpO2 100 %.    Vent Mode: PSV;CPAP FiO2 (%):  [36 %-40 %] 36 % PEEP:  [5 cmH20] 5 cmH20 Pressure Support:  [5 cmH20] 5 cmH20   Intake/Output Summary (Last 24 hours) at 10/25/2018 1248 Last data filed at 10/25/2018  1100 Gross per 24 hour  Intake 597.21 ml  Output 1845 ml  Net -1247.79 ml   Filed Weights   10/23/18 0500 10/24/18 0500 10/25/18 0400  Weight: 71.7 kg 72.6 kg 69 kg   General:Female OOB in chair eating lunch HEENT: MM pink/moist, NCAT. No LAD Neuro: Awake, answers questions, follows commands CV: s1s2 rrr, no m/r/g PULM: bilateral chest excursion, no wheeze, few rhonchi, 2.5 L [place] BS:WHQP, non-tender,ND,  bsx4 active  Extremities: warm/dry, no edema , no obvious deformities Skin: warm and dry, no rashes or lesions  Resolved Hospital Problem list   Extubated 10/28  Assessment & Plan:   Acute Hypoxemic Respiratory Failure in setting of Status Asthmaticus  -UDS positive for cocaine, THC, benzos -sputum positive for group B strep, completed abx 10/27 - Extubated 10/28 - High risk aspiration P: Continue singulair Reduce Solumedrol to 60mg  IV Q12 Duoneb Q4 Pulmicort BID Swallow study per speech High risk aspiration>> per Speech Therapy CXR 10/30 if + for infiltrate consider  starting Unasyn  Acute Bronchitis with Group B Strep T Max 102.8 10/28 WBC 21.6 P: Monitor off abx, completed 10/27  Trend fever Trend WBC Re-Culture as is clinically indicated PCT to guide therapy 10/30 am CXR as above Consider addition of Unasyn if infiltrate  Acute Metabolic +/- Toxic Encephalopathy Hx Bipolar Disorder, Depression  Hx Polysubstance Abuse  P: RASS Goal: 0 to -1  Discontinue  Seroquel  Follow QTc x 72 hours after seroquel discontinued Ativan 1mg  BID Resume home zoloft if QTc within acceptable limits  Resume home neurontin at reduced dose  Will need polysubstance abuse counseling at dc  Anemia of Chronic Disease P: Trend CBC  Transfuse per ICU guidelines   Hyperglycemia -exacerbated by steroids  P: CBG with SSI    Disposition / Summary of Today's Plan 10/25/18   Transfer to tele bed. Will need CXR in am, and if + for infiltrate, consider initiation of Unasyn New  fever, elevated WBC High risk aspiration>> speech following    Diet: tubefeeds Pain/Anxiety/Delirium protocol (if indicated): PAD protocol  VAP protocol (if indicated): yes DVT prophylaxis: [place] heparin  GI prophylaxis: H2B Hyperglycemia protocol: ordered Mobility: Bed rest / as tolerated  Code Status: full code Family Communication:No family available at bedside am 10/28.    LABS    PULMONARY No results for input(s): PHART, PCO2ART, PO2ART, HCO3, TCO2, O2SAT in the last 168 hours.  Invalid input(s): PCO2, PO2  CBC Recent Labs  Lab 10/23/18 0407 10/24/18 0415 10/25/18 0438  HGB 9.5* 10.4* 11.3*  HCT 31.3* 35.2* 37.7  WBC 11.3* 11.8* 21.6*  PLT 284 269 279    COAGULATION No results for input(s): INR in the last 168 hours.  CARDIAC  No results for input(s): TROPONINI in the last 168 hours. No results for input(s): PROBNP in the last 168 hours.   CHEMISTRY Recent Labs  Lab 10/21/18 0350 10/21/18 2342 10/22/18 0425 10/23/18 0407 10/24/18 0415 10/25/18 0438  NA 138 139  --  138 139 141  K 4.3 4.1  --  4.0 3.8 3.0*  CL 93* 95*  --  96* 100 102  CO2 37* 36*  --  35* 33* 29  GLUCOSE 149* 123*  --  176* 178* 117*  BUN 34* 36*  --  41* 44* 43*  CREATININE 0.78 0.82  --  0.94 0.80 0.81  CALCIUM 9.0 8.6*  --  9.0 9.2 9.4  MG 2.2  --  2.3 2.3 2.1 2.2  PHOS 4.7*  --  4.0 3.2 3.1 2.2*   Estimated Creatinine Clearance: 87.2 mL/min (by C-G formula based on SCr of 0.81 mg/dL).   LIVER Recent Labs  Lab 10/23/18 0407  AST 24  ALT 44  ALKPHOS 52  BILITOT 0.5  PROT 6.7  ALBUMIN 3.0*     INFECTIOUS Recent Labs  Lab 10/20/18 0113 10/23/18 0407 10/24/18 0415  LATICACIDVEN 0.8 1.1 1.1     ENDOCRINE CBG (last 3)  Recent Labs    10/24/18 2328 10/25/18 0345 10/25/18 0816  GLUCAP 110* 119* 141*     IMAGING   Dg Chest Port 1 View  Result Date: 10/24/2018 CLINICAL DATA:  Check endotracheal tube placement EXAM: PORTABLE CHEST 1 VIEW COMPARISON:   10/23/2018 FINDINGS: Cardiac shadows within normal limits., Endotracheal tube, nasogastric catheter left jugular central line are again seen and stable. The lungs are clear. No bony abnormality is noted. IMPRESSION: No active disease. Electronically Signed   By: Inez Catalina M.D.   On: 10/24/2018 08:39    Magdalen Spatz, AGACNP-BC [place] Pager # [phone] After 3 pm [phone] [facility] 10/25/2018, 12:48 PM

## 2018-10-25 NOTE — Consult Note (Signed)
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 Elsworth Soho   HPI: Deborah Melendez is a 42 y.o. female with history of bipolar disorder with recurrence, polysubstance abuse, asthma; who was admitted on 10/14/2018 with hypoxic respiratory failure with metabolic encephalopathy in setting of status asthmaticus requiring intubation.  UDS positive for opiates, benzos and cocaine.  CTA chest done revealing right middle lobe collapse without obstructive lesion and mild bronchial wall thickening felt to be due to bronchitis she was treated with stress dose steroids and bronchodilators.  Hospital course significant for delirium requiring Precedex due to agitation, self extubation X 2 on 10/21 as well as fluid overload requiring diuresis.  She tolerated extubation by 10/28 and noted to have dysphonia as well as dysphagia.  MBS done today and patient started on dysphagia 2- chopped with nectar liquids.  Therapy evaluations completed today and CIR recommended due to functional deficits   ROS    Past Medical History:  Diagnosis Date  . Bipolar 1 disorder, depressed, severe ([place]) 02/04/2017  . Cocaine abuse with cocaine-induced mood disorder ([place]) 07/27/2017  . Depression   . Homicidal ideations   . Manic behavior ([place])   . MDD (major depressive disorder), recurrent severe, without psychosis ([place]) 08/08/2015  . Substance or medication-induced bipolar and related disorder with onset during intoxication ([place]) 09/08/2016  . Suicidal ideation     Past Surgical History:  Procedure Laterality Date  . FINGER SURGERY      Family History  Problem Relation Age of Onset  . Diabetes Mother   . Hypertension Mother   . Drug abuse Father   . Schizophrenia Maternal Aunt     Social History: Married. Husband works days.  Disabled.  She reports reports that she has been smoking cigarettes-- 1-2 PPD?Marland Kitchen  She has never  used smokeless tobacco. She reports that he drinks about 10.0 standard drinks of alcohol per week. She reports that she uses drugs: Cocaine "a lot" and Marijuana.    Allergies: No Known Allergies    Medications Prior to Admission  Medication Sig Dispense Refill  . albuterol (PROVENTIL HFA;VENTOLIN HFA) 108 (90 Base) MCG/ACT inhaler Inhale 2 puffs into the lungs every 6 (six) hours as needed for wheezing or shortness of breath. 1 Inhaler 2  . Budesonide 90 MCG/ACT inhaler Inhale 1 puff into the lungs 2 (two) times daily. 1 Inhaler 1  . doxycycline (VIBRAMYCIN) 100 MG capsule Take 100 mg by mouth 2 (two) times daily.    Marland Kitchen FLOVENT HFA 44 MCG/ACT inhaler Inhale 2 puffs into the lungs 2 (two) times daily.  1  . gabapentin (NEURONTIN) 600 MG tablet Take 1 tablet (600 mg total) by mouth 3 (three) times daily. 90 tablet 0  . QUEtiapine (SEROQUEL) 300 MG tablet Take 1 tablet (300 mg total) by mouth at bedtime. (Patient taking differently: Take 300 mg by mouth at bedtime. ) 30 tablet 0  . sertraline (ZOLOFT) 100 MG tablet Take 1 tablet (100 mg total) by mouth daily. (Patient taking differently: Take 50 mg by mouth daily. ) 30 tablet 0  . traZODone (DESYREL) 50 MG tablet Take 1 tablet (50 mg total) by mouth at bedtime as needed for sleep. 30 tablet 0  . acetaminophen (TYLENOL) 325 MG tablet Take 2 tablets (650 mg total) by mouth every 6 (six) hours as needed for mild pain (or Fever >/= 101). 30 tablet 0  . ferrous sulfate 325 (  65 FE) MG tablet Take 1 tablet (325 mg total) by mouth 3 (three) times daily with meals. 21 tablet 0  . hydrOXYzine (ATARAX/VISTARIL) 50 MG tablet Take 1 tablet (50 mg total) by mouth 3 (three) times daily as needed for anxiety. 60 tablet 0  . predniSONE (DELTASONE) 20 MG tablet Take 20 mg by mouth daily with breakfast.      Home: Home Living Family/patient expects to be discharged to:: Private residence Living Arrangements: Spouse/significant other Available Help at Discharge:  Family, Available PRN/intermittently(husband works during the day) Type of Home: Apartment Home Access: Stairs to enter Technical brewer of Steps: 3 Entrance Stairs-Rails: Right Home Layout: One level Home Equipment: None  Functional History: Prior Function Level of Independence: Independent Functional Status:  Mobility: Bed Mobility Overal bed mobility: Needs Assistance Bed Mobility: Supine to Sit Supine to sit: Mod assist General bed mobility comments: Assist to move legs off of bed, elevate trunk into sitting, and bring hips to the EOB. Transfers Overall transfer level: Needs assistance Equipment used: Ambulation equipment used Transfer via Lift Equipment: Stedy Transfers: Sit to/from Stand, Risk manager Sit to Stand: +2 physical assistance, Mod assist Stand pivot transfers: (Used Stedy to transfer from bed to recliner) General transfer comment: Assist to bring hips and trunk up and for balance. Assist to place hands on bar of Stedy to grip. Pt needs extra time to initiate movement Ambulation/Gait General Gait Details: Unable    ADL:    Cognition: Cognition Overall Cognitive Status: Impaired/Different from baseline Orientation Level: Oriented to person, Oriented to place, Oriented to situation(patient thought it was 2000) Cognition Arousal/Alertness: Awake/alert Behavior During Therapy: Flat affect Overall Cognitive Status: Impaired/Different from baseline Area of Impairment: Orientation, Memory, Following commands, Safety/judgement, Problem solving Orientation Level: Disoriented to, Time, Situation Memory: Decreased short-term memory Following Commands: Follows one step commands with increased time Safety/Judgement: Decreased awareness of deficits, Decreased awareness of safety Problem Solving: Slow processing, Decreased initiation, Difficulty sequencing, Requires verbal cues, Requires tactile cues  Blood pressure 112/87, pulse 95, temperature 98.1 F  (36.7 C), resp. rate (!) 26, height 5\' 4"  (1.626 m), weight 69 kg, SpO2 100 %. Physical Exam  Nursing note and vitals reviewed. Constitutional: No distress.  Appears much older than stated age.  Frequent request for  Food due to hunger.   HENT:  Head: Normocephalic.  Eyes: Pupils are equal, round, and reactive to light.  Neck: Normal range of motion.  Cardiovascular: Normal rate.  Respiratory: Effort normal.  GI: Soft.  Musculoskeletal: She exhibits no edema.  Neurological: She is alert.  Dysphonic. UE 4/5 prox to distal. LE: 3/5 hf, ke and 3-/5 adf/apf. Delays in sequencing and processing  Skin: She is not diaphoretic.  Numerous scars on skin of arms and legs.   Psychiatric:  Flat affect, delayed, cooperative    Results for orders placed or performed during the hospital encounter of 10/14/18 (from the past 24 hour(s))  Glucose, capillary     Status: Abnormal   Collection Time: 10/24/18  4:18 PM  Result Value Ref Range   Glucose-Capillary 124 (H) 70 - 99 mg/dL   Comment 1 Capillary Specimen   Glucose, capillary     Status: Abnormal   Collection Time: 10/24/18  7:48 PM  Result Value Ref Range   Glucose-Capillary 105 (H) 70 - 99 mg/dL   Comment 1 Capillary Specimen   Glucose, capillary     Status: Abnormal   Collection Time: 10/24/18 11:28 PM  Result Value Ref Range  Glucose-Capillary 110 (H) 70 - 99 mg/dL   Comment 1 Capillary Specimen   Glucose, capillary     Status: Abnormal   Collection Time: 10/25/18  3:45 AM  Result Value Ref Range   Glucose-Capillary 119 (H) 70 - 99 mg/dL   Comment 1 Capillary Specimen   CBC with Differential/Platelet     Status: Abnormal   Collection Time: 10/25/18  4:38 AM  Result Value Ref Range   WBC 21.6 (H) 4.0 - 10.5 K/uL   RBC 4.23 3.87 - 5.11 MIL/uL   Hemoglobin 11.3 (L) 12.0 - 15.0 g/dL   HCT 37.7 36.0 - 46.0 %   MCV 89.1 80.0 - 100.0 fL   MCH 26.7 26.0 - 34.0 pg   MCHC 30.0 30.0 - 36.0 g/dL   RDW 19.6 (H) 11.5 - 15.5 %    Platelets 279 150 - 400 K/uL   nRBC 0.0 0.0 - 0.2 %   Neutrophils Relative % 79 %   Neutro Abs 16.8 (H) 1.7 - 7.7 K/uL   Lymphocytes Relative 14 %   Lymphs Abs 3.1 0.7 - 4.0 K/uL   Monocytes Relative 6 %   Monocytes Absolute 1.4 (H) 0.1 - 1.0 K/uL   Eosinophils Relative 0 %   Eosinophils Absolute 0.0 0.0 - 0.5 K/uL   Basophils Relative 0 %   Basophils Absolute 0.0 0.0 - 0.1 K/uL   Immature Granulocytes 1 %   Abs Immature Granulocytes 0.29 (H) 0.00 - 0.07 K/uL  Magnesium     Status: None   Collection Time: 10/25/18  4:38 AM  Result Value Ref Range   Magnesium 2.2 1.7 - 2.4 mg/dL  Phosphorus     Status: Abnormal   Collection Time: 10/25/18  4:38 AM  Result Value Ref Range   Phosphorus 2.2 (L) 2.5 - 4.6 mg/dL  Basic metabolic panel     Status: Abnormal   Collection Time: 10/25/18  4:38 AM  Result Value Ref Range   Sodium 141 135 - 145 mmol/L   Potassium 3.0 (L) 3.5 - 5.1 mmol/L   Chloride 102 98 - 111 mmol/L   CO2 29 22 - 32 mmol/L   Glucose, Bld 117 (H) 70 - 99 mg/dL   BUN 43 (H) 6 - 20 mg/dL   Creatinine, Ser 0.81 0.44 - 1.00 mg/dL   Calcium 9.4 8.9 - 10.3 mg/dL   GFR calc non Af Amer >60 >60 mL/min   GFR calc Af Amer >60 >60 mL/min   Anion gap 10 5 - 15  Glucose, capillary     Status: Abnormal   Collection Time: 10/25/18  8:16 AM  Result Value Ref Range   Glucose-Capillary 141 (H) 70 - 99 mg/dL   Comment 1 Capillary Specimen    Dg Chest Port 1 View  Result Date: 10/24/2018 CLINICAL DATA:  Check endotracheal tube placement EXAM: PORTABLE CHEST 1 VIEW COMPARISON:  10/23/2018 FINDINGS: Cardiac shadows within normal limits., Endotracheal tube, nasogastric catheter left jugular central line are again seen and stable. The lungs are clear. No bony abnormality is noted. IMPRESSION: No active disease. Electronically Signed   By: Inez Catalina M.D.   On: 10/24/2018 08:39     Assessment/Plan: Diagnosis: debility, encephalopathy, critical illness myopathy 1. Does the need for  close, 24 hr/day medical supervision in concert with the patient's rehab needs make it unreasonable for this patient to be served in a less intensive setting? Yes 2. Co-Morbidities requiring supervision/potential complications: cocaine abuse, depression, multiple psychiatric disorder 3. Due to  bladder management, bowel management, safety, skin/wound care, disease management, medication administration, pain management and patient education, does the patient require 24 hr/day rehab nursing? Yes 4. Does the patient require coordinated care of a physician, rehab nurse, PT (1-2 hrs/day, 5 days/week), OT (1-2 hrs/day, 5 days/week) and SLP (1-2 hrs/day, 5 days/week) to address physical and functional deficits in the context of the above medical diagnosis(es)? Yes Addressing deficits in the following areas: balance, endurance, locomotion, strength, transferring, bowel/bladder control, bathing, dressing, feeding, grooming, toileting, cognition and psychosocial support 5. Can the patient actively participate in an intensive therapy program of at least 3 hrs of therapy per day at least 5 days per week? Yes 6. The potential for patient to make measurable gains while on inpatient rehab is excellent 7. Anticipated functional outcomes upon discharge from inpatient rehab are supervision  with PT, supervision with OT, supervision with SLP. 8. Estimated rehab length of stay to reach the above functional goals is: 15-20 days 9. Anticipated D/C setting: Home 10. Anticipated post D/C treatments: HH therapy and Outpatient therapy 11. Overall Rehab/Functional Prognosis: excellent  RECOMMENDATIONS: This patient's condition is appropriate for continued rehabilitative care in the following setting: CIR Patient has agreed to participate in recommended program. Yes Note that insurance prior authorization may be required for reimbursement for recommended care.  Comment: Rehab Admissions Coordinator to follow  up.  Thanks,  Meredith Staggers, MD, Mellody Drown  I have personally performed a face to face diagnostic evaluation of this patient. Additionally, I have reviewed and concur with the physician assistant's documentation above.    Bary Leriche, PA-C 10/25/2018

## 2018-10-25 NOTE — Progress Notes (Signed)
Blood cultures from 10/25/18 at 13:16 not showing as drawn. Blood culture order refreshed for phlebotomy to draw. Will continue to monitor.

## 2018-10-25 NOTE — Procedures (Signed)
Objective Swallowing Evaluation: Type of Study: FEES-Fiberoptic Endoscopic Evaluation of Swallow   Patient Details  Name: Deborah Melendez MRN: [id number] Date of Birth: Jul 08, 1976  Today's Date: 10/25/2018 Time: SLP Start Time (ACUTE ONLY): 9211 -SLP Stop Time (ACUTE ONLY): 0949  SLP Time Calculation (min) (ACUTE ONLY): 27 min   Past Medical History:  Past Medical History:  Diagnosis Date  . Bipolar 1 disorder, depressed, severe ([place]) 02/04/2017  . Cocaine abuse with cocaine-induced mood disorder ([place]) 07/27/2017  . Depression   . Homicidal ideations   . Manic behavior ([place])   . MDD (major depressive disorder), recurrent severe, without psychosis ([place]) 08/08/2015  . Substance or medication-induced bipolar and related disorder with onset during intoxication ([place]) 09/08/2016  . Suicidal ideation    Past Surgical History:  Past Surgical History:  Procedure Laterality Date  . FINGER SURGERY     HPI: Pt is a 42 y.o. female admitted on 10/18 with working dx of asthmatic exacerbation, VCD with worsening hypoxia and encephalopathy. She had recent admission and d/c about 1 week prior after being admitted for acute bronchitis and asthmatic exacerbation. PMH significant for cocaine abuse, bipolar disease, depression, asthma. Intubated 10/18-10/28 (self extubated X2 on 10/18). Per MD note 10/19, suspect component of upper airway disease/VCD contributing given presenting loud audible wheezing from bedside and heavy psychiatric history. CXR = no active disease.   No data recorded   Assessment / Plan / Recommendation  CHL IP CLINICAL IMPRESSIONS 10/25/2018  Clinical Impression Pt displayed mild-mod oropharyngeal dysphagia with varying levels of oral and pharyngeal residue across textures as well as delayed swallow initiation at the pyriform sinuses and aspiration with thin. Of note, aryteniod edema observed (right greater than left) and candida present in oral and pharyngeal cavities with  contact on left vocal fold. Thin liquid observed on vocal cords and below in trachea during volitional cough presumably occuring during the swallow of pharyngeal residue  While mild vallecular and pyriform residue also present with nectar, no airway intrustion was observed. Puree and regular textures produced mild vallecular, pyriform, and interarytenoid space residue and oral impairments including anterior loss and prolonged mastication of regular texture also noted. Recommend dysphagia 2 (minced) diet, nectar thick liquids, meds whole in applesauce (crush if large) extra dry swallow after each bite/sip, no straws, take small sips/bites, slow rate, and FULL assist and supervision with feeding. ST will continue to follow acutely to provide treatment with diet safety and efficiency including opportunities for diet advancement.   SLP Visit Diagnosis Dysphagia, oropharyngeal phase (R13.12)  Attention and concentration deficit following --  Frontal lobe and executive function deficit following --  Impact on safety and function Severe aspiration risk;Moderate aspiration risk      CHL IP TREATMENT RECOMMENDATION 10/25/2018  Treatment Recommendations Therapy as outlined in treatment plan below     Prognosis 10/25/2018  Prognosis for Safe Diet Advancement Good  Barriers to Reach Goals --  Barriers/Prognosis Comment --    CHL IP DIET RECOMMENDATION 10/25/2018  SLP Diet Recommendations Dysphagia 2 (Fine chop) solids;Nectar thick liquid  Liquid Administration via Cup;No straw  Medication Administration Whole meds with puree  Compensations Minimize environmental distractions;Slow rate;Small sips/bites;Multiple dry swallows after each bite/sip;Lingual sweep for clearance of pocketing  Postural Changes Seated upright at 90 degrees      CHL IP OTHER RECOMMENDATIONS 10/25/2018  Recommended Consults --  Oral Care Recommendations Oral care BID  Other Recommendations Order thickener from pharmacy;Have oral  suction available;Remove water pitcher  CHL IP FOLLOW UP RECOMMENDATIONS 10/25/2018  Follow up Recommendations Skilled Nursing facility      [facility] IP FREQUENCY AND DURATION 10/25/2018  Speech Therapy Frequency (ACUTE ONLY) min 2x/week  Treatment Duration 2 weeks           CHL IP ORAL PHASE 10/25/2018  Oral Phase Impaired  Oral - Pudding Teaspoon --  Oral - Pudding Cup --  Oral - Honey Teaspoon --  Oral - Honey Cup --  Oral - Nectar Teaspoon --  Oral - Nectar Cup WFL  Oral - Nectar Straw --  Oral - Thin Teaspoon --  Oral - Thin Cup WFL  Oral - Thin Straw --  Oral - Puree Lingual/palatal residue  Oral - Mech Soft --  Oral - Regular Left anterior bolus loss  Oral - Multi-Consistency --  Oral - Pill --  Oral Phase - Comment --    CHL IP PHARYNGEAL PHASE 10/25/2018  Pharyngeal Phase Impaired  Pharyngeal- Pudding Teaspoon --  Pharyngeal --  Pharyngeal- Pudding Cup --  Pharyngeal --  Pharyngeal- Honey Teaspoon --  Pharyngeal --  Pharyngeal- Honey Cup --  Pharyngeal --  Pharyngeal- Nectar Teaspoon --  Pharyngeal --  Pharyngeal- Nectar Cup Pharyngeal residue - pyriform;Pharyngeal residue - valleculae  Pharyngeal --  Pharyngeal- Nectar Straw --  Pharyngeal --  Pharyngeal- Thin Teaspoon --  Pharyngeal --  Pharyngeal- Thin Cup Delayed swallow initiation-pyriform sinuses;Penetration/Apiration after swallow;Pharyngeal residue - valleculae  Pharyngeal Material enters airway, passes BELOW cords and not ejected out despite cough attempt by patient  Pharyngeal- Thin Straw --  Pharyngeal --  Pharyngeal- Puree Pharyngeal residue - valleculae  Pharyngeal --  Pharyngeal- Mechanical Soft --  Pharyngeal --  Pharyngeal- Regular Inter-arytenoid space residue;Pharyngeal residue - pyriform  Pharyngeal --  Pharyngeal- Multi-consistency --  Pharyngeal --  Pharyngeal- Pill --  Pharyngeal --  Pharyngeal Comment --     CHL IP CERVICAL ESOPHAGEAL PHASE 10/25/2018  Cervical  Esophageal Phase WFL  Pudding Teaspoon --  Pudding Cup --  Honey Teaspoon --  Honey Cup --  Nectar Teaspoon --  Nectar Cup --  Nectar Straw --  Thin Teaspoon --  Thin Cup --  Thin Straw --  Puree --  Mechanical Soft --  Regular --  Multi-consistency --  Pill --  Cervical Esophageal Comment --     [place] Siren 10/25/2018, 11:43 AM  Orbie Pyo Colvin Caroli.Ed Risk analyst [phone] Office [phone]

## 2018-10-25 NOTE — Progress Notes (Signed)
Rehab Admissions Coordinator Note:  Patient was screened by Cleatrice Burke for appropriateness for an Inpatient Acute Rehab Consult per PT recommendation.   At this time, we are recommending Inpatient Rehab consult. Please place order for consult if you would like pt considered for an CIR admit.  Danne Baxter, RN, MSN Rehab Admissions Coordinator [phone] 10/25/2018 11:55 AM

## 2018-10-25 NOTE — Plan of Care (Signed)
  Problem: Clinical Measurements: Goal: Ability to maintain clinical measurements within normal limits will improve Outcome: Progressing   Problem: Coping: Goal: Level of anxiety will decrease Outcome: Progressing   Problem: Elimination: Goal: Will not experience complications related to bowel motility Outcome: Progressing   Problem: Pain Managment: Goal: General experience of comfort will improve Outcome: Progressing   Problem: Safety: Goal: Ability to remain free from injury will improve Outcome: Progressing   Problem: Skin Integrity: Goal: Risk for impaired skin integrity will decrease Outcome: Progressing   

## 2018-10-26 ENCOUNTER — Inpatient Hospital Stay (HOSPITAL_COMMUNITY): Payer: Self-pay

## 2018-10-26 DIAGNOSIS — J202 Acute bronchitis due to streptococcus: Secondary | ICD-10-CM

## 2018-10-26 DIAGNOSIS — G9341 Metabolic encephalopathy: Secondary | ICD-10-CM

## 2018-10-26 LAB — GLUCOSE, CAPILLARY
GLUCOSE-CAPILLARY: 102 mg/dL — AB (ref 70–99)
GLUCOSE-CAPILLARY: 146 mg/dL — AB (ref 70–99)
GLUCOSE-CAPILLARY: 176 mg/dL — AB (ref 70–99)
Glucose-Capillary: 99 mg/dL (ref 70–99)
Glucose-Capillary: 100 mg/dL — ABNORMAL HIGH (ref 70–99)
Glucose-Capillary: 203 mg/dL — ABNORMAL HIGH (ref 70–99)

## 2018-10-26 LAB — CBC WITH DIFFERENTIAL/PLATELET
Abs Immature Granulocytes: 0.26 10*3/uL — ABNORMAL HIGH (ref 0.00–0.07)
Basophils Absolute: 0 10*3/uL (ref 0.0–0.1)
Basophils Relative: 0 %
EOS ABS: 0.1 10*3/uL (ref 0.0–0.5)
EOS PCT: 1 %
HEMATOCRIT: 43.1 % (ref 36.0–46.0)
Hemoglobin: 13.1 g/dL (ref 12.0–15.0)
IMMATURE GRANULOCYTES: 2 %
LYMPHS ABS: 3.5 10*3/uL (ref 0.7–4.0)
Lymphocytes Relative: 26 %
MCH: 26.8 pg (ref 26.0–34.0)
MCHC: 30.4 g/dL (ref 30.0–36.0)
MCV: 88.3 fL (ref 80.0–100.0)
MONO ABS: 0.8 10*3/uL (ref 0.1–1.0)
MONOS PCT: 6 %
Neutro Abs: 8.6 10*3/uL — ABNORMAL HIGH (ref 1.7–7.7)
Neutrophils Relative %: 65 %
Platelets: 259 10*3/uL (ref 150–400)
RBC: 4.88 MIL/uL (ref 3.87–5.11)
RDW: 19.9 % — AB (ref 11.5–15.5)
WBC: 13.4 10*3/uL — ABNORMAL HIGH (ref 4.0–10.5)
nRBC: 0 % (ref 0.0–0.2)

## 2018-10-26 LAB — BASIC METABOLIC PANEL
Anion gap: 14 (ref 5–15)
BUN: 39 mg/dL — AB (ref 6–20)
CHLORIDE: 99 mmol/L (ref 98–111)
CO2: 29 mmol/L (ref 22–32)
Calcium: 9.9 mg/dL (ref 8.9–10.3)
Creatinine, Ser: 0.79 mg/dL (ref 0.44–1.00)
GFR calc Af Amer: >60 mL/min (ref >60)
GFR calc non Af Amer: >60 mL/min (ref >60)
Glucose, Bld: 107 mg/dL — ABNORMAL HIGH (ref 70–99)
POTASSIUM: 3.5 mmol/L (ref 3.5–5.1)
SODIUM: 142 mmol/L (ref 135–145)

## 2018-10-26 LAB — MAGNESIUM: Magnesium: 2.2 mg/dL (ref 1.7–2.4)

## 2018-10-26 LAB — PHOSPHORUS: Phosphorus: 2.4 mg/dL — ABNORMAL LOW (ref 2.5–4.6)

## 2018-10-26 LAB — PROCALCITONIN

## 2018-10-26 MED ORDER — NYSTATIN 100000 UNIT/ML MT SUSP
5.0000 mL | Freq: Four times a day (QID) | OROMUCOSAL | Status: DC
  Administered 2018-10-26 – 2018-10-28 (×7): 500000 [IU] via OROMUCOSAL
  Filled 2018-10-26 (×8): qty 5

## 2018-10-26 MED ORDER — POTASSIUM CHLORIDE CRYS ER 20 MEQ PO TBCR
20.0000 meq | EXTENDED_RELEASE_TABLET | Freq: Once | ORAL | Status: AC
  Administered 2018-10-26: 20 meq via ORAL
  Filled 2018-10-26: qty 1

## 2018-10-26 NOTE — Progress Notes (Signed)
  Speech Language Pathology Treatment: Dysphagia  Patient Details Name: Deborah Melendez MRN: [id number] DOB: 04-16-1976 Today's Date: 10/26/2018 Time: 0102-7253 SLP Time Calculation (min) (ACUTE ONLY): 22 min  Assessment / Plan / Recommendation Clinical Impression  Pt provided moderate multimodal cues given challenges with sustained attention and delayed information processing/ responses. Therapist reiterated recommendations after MBS. Mild labial residue and hesitation during oral transit of nectar thickened grape juice without cough, throat clear or wet vocal quality. Pt is impulsive needing verbal cues for smaller bites and steadying assist during self feeding. Prognosis for return to thin is good. Continue intervention for swallow and recommend assessment for cognition on acute or if/when goes to inpatient rehab.    HPI HPI: Pt is a 42 y.o. female admitted on 10/18 with working dx of asthmatic exacerbation, VCD with worsening hypoxia and encephalopathy. She had recent admission and d/c about 1 week prior after being admitted for acute bronchitis and asthmatic exacerbation. PMH significant for cocaine abuse, bipolar disease, depression, asthma. Intubated 10/18-10/28 (self extubated X2 on 10/18). Per MD note 10/19, suspect component of upper airway disease/VCD contributing given presenting loud audible wheezing from bedside and heavy psychiatric history. CXR = no active disease.      SLP Plan  Continue with current plan of care       Recommendations  Diet recommendations: Dysphagia 2 (fine chop);Nectar-thick liquid Liquids provided via: Cup;No straw Medication Administration: Whole meds with puree Supervision: Patient able to self feed;Full supervision/cueing for compensatory strategies Compensations: Minimize environmental distractions;Slow rate;Small sips/bites;Lingual sweep for clearance of pocketing Postural Changes and/or Swallow Maneuvers: Seated upright 90 degrees              Oral Care Recommendations: Oral care BID Follow up Recommendations: Inpatient Rehab SLP Visit Diagnosis: Dysphagia, oropharyngeal phase (R13.12) Plan: Continue with current plan of care                      [place] Siren 10/26/2018, 11:39 AM   Orbie Pyo Colvin Caroli.Ed Risk analyst [phone] Office [phone]

## 2018-10-26 NOTE — Progress Notes (Signed)
Pt transported to xray 

## 2018-10-26 NOTE — Progress Notes (Signed)
Educated pt on diet restrictions, pt requesting ice cream, informed she cannot have per dietary and speech  Speech therapy at bedside now educating as well

## 2018-10-26 NOTE — Progress Notes (Signed)
[facility] PROGRESS NOTE  Ricci Paff JME:[id number] DOB: 1976/12/10 DOA: 10/14/2018  PCP: Deborah Melendez, No Pcp Per  Brief History/Interval Summary: 42 year old female admitted on 10/18 w/ working dx of asthmatic exacerbation w/ worsening hypoxia and encephalopathy. Intubated, required paralytics for ventilator synchrony. UDS positive for THC, cocaine & benzos.    Significant Hospital Events   10/18  Admitted, intubated 10/21  Self extubated x 2 on same day, re-intubated. ABG worse. Deeply sedated and paralyzed.  10/22  Hemoglobin low, Transfuse 1 unit. Nimbex stopped.  10/23  Got vent dysnch on sedation wua. HR 130 and wheezing. Agitated on WUA. Hypetensive 10/24  Wheezing improved after lasix.  Tolerating TF.  Mother indicates she smokes "a lot" 10/25  ETT low. Wheezy despite ET tube and diureses. EF 55% 10/26  Fent gtt/diprivan gtt -> agitation barrier to wean 10/28>> Extubated  Procedures (surgical and bedside):  ETT 10/18 >> 10/28  Significant Diagnostic Tests:  CTA Chest 10/18 >> no pulmonary embolus, right middle lobe collapse, bronchial wall thickening, bronchitis, mild basal atelectasis.  UDS 10/18 >> cocaine, benzos, marojiuana  Micro Data:  HIV 10/19 >> neg Sputum 10/20 >> group B strep Respiratory virus panel 10/8 >> negative  Antimicrobials:  Azithro 10/18 >> 10/23 Ampicillin 10/23 >> 10/27    Subjective/Interval History: Deborah Melendez states that she feels better.  Asking to drink water.  Denies any shortness of breath.  No chest pain.  No nausea or vomiting.  ROS: Denies any headaches.  Objective:  Vital Signs  Vitals:   10/26/18 0412 10/26/18 0544 10/26/18 0748 10/26/18 0834  BP: 118/86   (!) 129/91  Pulse: 97   (!) 118  Resp: 18     Temp: (!) 97.4 F (36.3 C)     TempSrc: Oral     SpO2: 96%  95%   Weight:  67.9 kg    Height:        Intake/Output Summary (Last 24 hours) at 10/26/2018 1048 Last data filed at 10/26/2018  0900 Gross per 24 hour  Intake 809.92 ml  Output 975 ml  Net -165.08 ml   Filed Weights   10/24/18 0500 10/25/18 0400 10/26/18 0544  Weight: 72.6 kg 69 kg 67.9 kg    General appearance: alert, cooperative, appears stated age, distracted and no distress Head: Normocephalic, without obvious abnormality, atraumatic Resp: Normal effort at rest.  Coarse breath sounds heard bilaterally.  No wheezing or rhonchi. Cardio: regular rate and rhythm, S1, S2 normal, no murmur, click, rub or gallop GI: soft, non-tender; bowel sounds normal; no masses,  no organomegaly Extremities: extremities normal, atraumatic, no cyanosis or edema Pulses: 2+ and symmetric Neurologic: No obvious focal neurological deficits.  Lab Results:  Data Reviewed: I have personally reviewed following labs and imaging studies  CBC: Recent Labs  Lab 10/22/18 0425 10/23/18 0407 10/24/18 0415 10/25/18 0438 10/26/18 0029  WBC 11.2* 11.3* 11.8* 21.6* 13.4*  NEUTROABS 9.5* 9.4* 10.1* 16.8* 8.6*  HGB 9.4* 9.5* 10.4* 11.3* 13.1  HCT 30.7* 31.3* 35.2* 37.7 43.1  MCV 88.0 88.4 88.2 89.1 88.3  PLT 280 284 269 279 229    Basic Metabolic Panel: Recent Labs  Lab 10/21/18 2342 10/22/18 0425 10/23/18 0407 10/24/18 0415 10/25/18 0438 10/26/18 0029  NA 139  --  138 139 141 142  K 4.1  --  4.0 3.8 3.0* 3.5  CL 95*  --  96* 100 102 99  CO2 36*  --  35* 33* 29 29  GLUCOSE 123*  --  176* 178* 117* 107*  BUN 36*  --  41* 44* 43* 39*  CREATININE 0.82  --  0.94 0.80 0.81 0.79  CALCIUM 8.6*  --  9.0 9.2 9.4 9.9  MG  --  2.3 2.3 2.1 2.2 2.2  PHOS  --  4.0 3.2 3.1 2.2* 2.4*    GFR: Estimated Creatinine Clearance: 87.7 mL/min (by C-G formula based on SCr of 0.79 mg/dL).  Liver Function Tests: Recent Labs  Lab 10/23/18 0407  AST 24  ALT 44  ALKPHOS 52  BILITOT 0.5  PROT 6.7  ALBUMIN 3.0*    Cardiac Enzymes: Recent Labs  Lab 10/20/18 0425 10/22/18 1556  CKTOTAL 106 229  CKMB 2.0 1.4    CBG: Recent Labs   Lab 10/25/18 1601 10/25/18 2002 10/26/18 0000 10/26/18 0410 10/26/18 0803  GLUCAP 114* 119* 146* 99 100*     Recent Results (from the past 240 hour(s))  Culture, respiratory (tracheal aspirate)     Status: None   Collection Time: 10/16/18  3:34 PM  Result Value Ref Range Status   Specimen Description TRACHEAL ASPIRATE  Final   Special Requests NONE  Final   Gram Stain   Final    FEW WBC PRESENT, PREDOMINANTLY PMN NO ORGANISMS SEEN    Culture   Final    FEW GROUP B STREP(S.AGALACTIAE)ISOLATED TESTING AGAINST S. AGALACTIAE NOT ROUTINELY PERFORMED DUE TO PREDICTABILITY OF AMP/PEN/VAN SUSCEPTIBILITY. Performed at [facility], [place] [address], [place], [place] [address]    Report Status 10/18/2018 FINAL  Final  Culture, blood (routine x 2)     Status: None (Preliminary result)   Collection Time: 10/22/18 10:21 PM  Result Value Ref Range Status   Specimen Description BLOOD RIGHT HAND  Final   Special Requests   Final    BOTTLES DRAWN AEROBIC ONLY Blood Culture adequate volume   Culture   Final    NO GROWTH 3 DAYS Performed at [facility], [place] [address] [place], [place] [address]    Report Status PENDING  Incomplete  Culture, blood (routine x 2)     Status: None (Preliminary result)   Collection Time: 10/22/18 10:40 PM  Result Value Ref Range Status   Specimen Description BLOOD LEFT ANTECUBITAL  Final   Special Requests   Final    BOTTLES DRAWN AEROBIC ONLY Blood Culture adequate volume   Culture   Final    NO GROWTH 3 DAYS Performed at [facility], [place] [address] [place], [place] [address]    Report Status PENDING  Incomplete  Culture, blood (routine x 2)     Status: None (Preliminary result)   Collection Time: 10/26/18 12:29 AM  Result Value Ref Range Status   Specimen Description   Final    BLOOD LEFT ANTECUBITAL Performed at [facility], [place] [address], [place], [place] [address]    Special Requests   Final    BOTTLES DRAWN AEROBIC  ONLY Blood Culture adequate volume   Culture PENDING  Incomplete   Report Status PENDING  Incomplete      Radiology Studies: No results found.   Medications:  Scheduled: . budesonide (PULMICORT) nebulizer solution  0.5 mg Nebulization BID  . chlorhexidine  15 mL Mouth Rinse BID  . famotidine  20 mg Oral BID  . furosemide  40 mg Intravenous Daily  . gabapentin  100 mg Oral BID  . heparin  5,000 Units Subcutaneous Q8H  . insulin aspart  0-15 Units  Subcutaneous Q4H  . ipratropium-albuterol  3 mL Nebulization TID  . mouth rinse  15 mL Mouth Rinse BID  . mouth rinse  15 mL Mouth Rinse q12n4p  . methylPREDNISolone (SOLU-MEDROL) injection  40 mg Intravenous Daily  . montelukast  10 mg Oral QHS  . polyethylene glycol  17 g Oral Daily  . potassium chloride  40 mEq Oral QHS  . QUEtiapine  300 mg Per Tube QHS  . sertraline  50 mg Oral QHS   Continuous: . sodium chloride Stopped (10/24/18 1715)   EEF:EOFHQR chloride, acetaminophen (TYLENOL) oral liquid 160 mg/5 mL, bisacodyl, docusate, hydrALAZINE, RESOURCE THICKENUP CLEAR, sennosides  Assessment/Plan:    Acute respiratory failure with hypoxia/status asthmaticus Deborah Melendez was in the intensive care unit.  She was intubated.  Successfully extubated on 10/28.  Remains at high risk for aspiration.  Speech therapy is following.  She is on a dysphagia diet.  Respiratory status is stable.  Chest x-ray this morning does not show any acute findings.  She is noted to be on nebulizer treatments.  She is also on nebulized budesonide.  Solu-Medrol was decreased on 10/29.  She is on Singulair.  Acute bronchitis with group B strep Had a fever of 102.8 on 10/28.  Repeat blood cultures negative so far.  WBC was elevated at 21.6 yesterday but improved this morning.  No further episodes of fever.  Continue to monitor.  Await results of blood culture.  Hold off on antibiotics.  She completed a course of antibiotics on 10/27.  Acute metabolic versus toxic  encephalopathy Mental status appears to be stable.  History of bipolar disorder and depression Stable.  History of polysubstance abuse Noted to be on lorazepam.  Noted to be on Seroquel.  QT interval on last EKG from 10/28 was normal.  Deborah Melendez on gabapentin and sertraline.  Anemia of chronic disease Stable.  No evidence of overt bleeding.  Hyperglycemia Most likely secondary to steroids.  Improved now.   DVT Prophylaxis: Subcutaneous heparin    Code Status: Full code Family Communication: Discussed with the Deborah Melendez Disposition Plan: Management as outlined above.  Inpatient rehabilitation is evaluating Deborah Melendez.    LOS: 12 days   [facility] Pager [phone] 10/26/2018, 10:48 AM  If 7PM-7AM, please contact night-coverage at [email or web address], password [facility]

## 2018-10-26 NOTE — Evaluation (Signed)
Occupational Therapy Evaluation Patient Details Name: Deborah Melendez MRN: [id number] DOB: June 25, 1976 Today's Date: 10/26/2018    History of Present Illness Pt is a 42 y.o. female admitted 10/14/18 with asthmatic exacerbation, worsening hypoxia and encephalopathy. UDS (+) THC, cocaine, benzos. Chest CT 10/18 with R middle lobe collapse, bronchitis; no PE. Intub 10/18, self-extub x2 on 10/21; reintubated 10/21-10/28. PMH includes asthma, bipolar disorder.   Clinical Impression   Pt was independent prior to admission. Presents with significant, global weakness, poor standing balance and impaired cognition. She requires +2 mod-max assist for short distance ambulation and moderate to total assist for ADL. Pt will need intensive rehab upon medical stability, recommending CIR. Will follow acutely.    Follow Up Recommendations  CIR    Equipment Recommendations  3 in 1 bedside commode    Recommendations for Other Services       Precautions / Restrictions Precautions Precautions: Fall Restrictions Weight Bearing Restrictions: No      Mobility Bed Mobility               General bed mobility comments: Received sitting on BSC  Transfers Overall transfer level: Needs assistance Equipment used: 2 person hand held assist Transfers: Sit to/from Stand Sit to Stand: Mod assist;+2 physical assistance         General transfer comment: ModA+2 to assist trunk elevation and maintain balance    Balance Overall balance assessment: Needs assistance Sitting-balance support: Bilateral upper extremity supported;Feet supported Sitting balance-Leahy Scale: Fair Sitting balance - Comments: Required cues to scoot back at EOB and recliner to keep from sliding onto floor   Standing balance support: Bilateral upper extremity supported Standing balance-Leahy Scale: Poor                             ADL either performed or assessed with clinical judgement   ADL Overall  ADL's : Needs assistance/impaired Eating/Feeding: Sitting;Maximal assistance Eating/Feeding Details (indicate cue type and reason): weakness and cognitive component Grooming: Brushing hair;Sitting;Total assistance   Upper Body Bathing: Maximal assistance;Sitting   Lower Body Bathing: Total assistance;+2 for physical assistance;Sit to/from stand   Upper Body Dressing : Moderate assistance;Sitting   Lower Body Dressing: Total assistance;+2 for physical assistance;Sit to/from stand   Toilet Transfer: +2 for physical assistance;Moderate assistance;Stand-pivot;BSC   Toileting- Clothing Manipulation and Hygiene: +2 for physical assistance;Total assistance;Sit to/from stand       Functional mobility during ADLs: +2 for physical assistance;Moderate assistance;Maximal assistance       Vision Patient Visual Report: No change from baseline Vision Assessment?: No apparent visual deficits     Perception     Praxis      Pertinent Vitals/Pain Pain Assessment: Faces Faces Pain Scale: No hurt Pain Location: Did not state Pain Descriptors / Indicators: Grimacing Pain Intervention(s): Monitored during session     Hand Dominance Right   Extremity/Trunk Assessment Upper Extremity Assessment Upper Extremity Assessment: Generalized weakness   Lower Extremity Assessment Lower Extremity Assessment: Defer to PT evaluation   Cervical / Trunk Assessment Cervical / Trunk Assessment: Other exceptions(weakness)   Communication Communication Communication: Other (comment)(soft voice)   Cognition Arousal/Alertness: Awake/alert Behavior During Therapy: Flat affect Overall Cognitive Status: Impaired/Different from baseline Area of Impairment: Orientation;Attention;Memory;Following commands;Safety/judgement;Awareness;Problem solving                 Orientation Level: Disoriented to;Time;Situation Current Attention Level: Sustained Memory: Decreased short-term memory Following  Commands: Follows one step commands with increased time;Follows  one step commands inconsistently Safety/Judgement: Decreased awareness of deficits;Decreased awareness of safety Awareness: Intellectual Problem Solving: Slow processing;Decreased initiation;Difficulty sequencing;Requires verbal cues;Requires tactile cues General Comments: max encouragement to participate, initially fearful of ambulation   General Comments       Exercises     Shoulder Instructions      Home Living Family/patient expects to be discharged to:: Private residence Living Arrangements: Spouse/significant other Available Help at Discharge: Family;Available PRN/intermittently(husband works days) Type of Home: Apartment Home Access: Stairs to enter Technical brewer of Steps: 3 Entrance Stairs-Rails: Right Home Layout: One level     Bathroom Shower/Tub: Teacher, early years/pre: Standard     Home Equipment: None          Prior Functioning/Environment Level of Independence: Independent                 OT Problem List: Decreased strength;Decreased activity tolerance;Impaired balance (sitting and/or standing);Decreased coordination;Decreased cognition;Decreased knowledge of use of DME or AE;Impaired UE functional use      OT Treatment/Interventions: Self-care/ADL training;Therapeutic exercise;Therapeutic activities;Cognitive remediation/compensation;Patient/family education;Balance training;DME and/or AE instruction    OT Goals(Current goals can be found in the care plan section) Acute Rehab OT Goals Patient Stated Goal: Eat applesauce OT Goal Formulation: With patient Time For Goal Achievement: 11/09/18 Potential to Achieve Goals: Good  OT Frequency: Min 2X/week   Barriers to D/C:            Co-evaluation PT/OT/SLP Co-Evaluation/Treatment: Yes Reason for Co-Treatment: For patient/therapist safety PT goals addressed during session: Mobility/safety with  mobility;Balance OT goals addressed during session: ADL's and self-care      AM-PAC PT "6 Clicks" Daily Activity     Outcome Measure Help from another person eating meals?: A Lot Help from another person taking care of personal grooming?: A Lot Help from another person toileting, which includes using toliet, bedpan, or urinal?: Total Help from another person bathing (including washing, rinsing, drying)?: A Lot Help from another person to put on and taking off regular upper body clothing?: A Lot Help from another person to put on and taking off regular lower body clothing?: Total 6 Click Score: 10   End of Session Equipment Utilized During Treatment: Gait belt Nurse Communication: Mobility status  Activity Tolerance: Patient tolerated treatment well Patient left: in chair;with call bell/phone within reach;with chair alarm set  OT Visit Diagnosis: Unsteadiness on feet (R26.81);Other abnormalities of gait and mobility (R26.89);Muscle weakness (generalized) (M62.81);Cognitive communication deficit (R41.841)                Time: 8110-3159 OT Time Calculation (min): 26 min Charges:  OT General Charges $OT Visit: 1 Visit OT Evaluation $OT Eval Moderate Complexity: [place], OTR/L Acute Rehabilitation Services Pager: [phone] Office: [phone]  Malka So 10/26/2018, 10:29 AM

## 2018-10-26 NOTE — Progress Notes (Signed)
Inpatient Rehabilitation Admissions Coordinator  I met with patient at bedside. She is distracted for having issues with diarrhea this afternoon. I have placed a call to her spouse, Kittie Plater, to discuss her options for rehab. I await his call back.  Danne Baxter, RN, MSN Rehab Admissions Coordinator [phone] 10/26/2018 2:39 PM

## 2018-10-26 NOTE — Progress Notes (Signed)
Regarding MEWS of 2, pt is working with physical therapy, will reassess

## 2018-10-26 NOTE — Progress Notes (Signed)
Physical Therapy Treatment Patient Details Name: Deborah Melendez MRN: [id number] DOB: August 31, 1976 Today's Date: 10/26/2018    History of Present Illness Pt is a 42 y.o. female admitted 10/14/18 with asthmatic exacerbation, worsening hypoxia and encephalopathy. UDS (+) THC, cocaine, benzos. Chest CT 10/18 with R middle lobe collapse, bronchitis; no PE. Intub 10/18, self-extub x2 on 10/21; reintubated 10/21-10/28. PMH includes asthma, bipolar disorder.   PT Comments    Pt progressing with mobility. Able to stand and ambulate 12' with bilat HHA and mod-maxA+2; very unstable gait with scissoring and poor awareness of BLEs, especially LLE. Also noticed pt not consistently using LUE without cues. Mobility deficits exacerbated by disorientation, slowed processing, and poor safety awareness. Continue to recommend intensive CIR-level therapies for return to indep PLOF.    Follow Up Recommendations  CIR;Supervision/Assistance - 24 hour     Equipment Recommendations  (TBD next venue)    Recommendations for Other Services       Precautions / Restrictions Precautions Precautions: Fall Restrictions Weight Bearing Restrictions: No    Mobility  Bed Mobility               General bed mobility comments: Received sitting on BSC  Transfers Overall transfer level: Needs assistance Equipment used: 2 person hand held assist Transfers: Sit to/from Stand Sit to Stand: Mod assist;+2 physical assistance         General transfer comment: ModA+2 to assist trunk elevation and maintain balance  Ambulation/Gait Ambulation/Gait assistance: Mod assist;Max assist;+2 physical assistance Gait Distance (Feet): 10 Feet Assistive device: 2 person hand held assist Gait Pattern/deviations: Step-to pattern;Step-through pattern;Staggering right;Staggering left;Leaning posteriorly;Scissoring Gait velocity: Decreased Gait velocity interpretation: <1.31 ft/sec, indicative of household  ambulator General Gait Details: Pt initially reluctant to try walking, pivoting from [facility] to bed and sitting despite cues to stay up, requiring 2+ assist to keep from sliding from bed. After seated rest, able to ambulate 12' with bilat HHA and mod-maxA+2 as pt fatigued and attempting to sit with no chair present multiple times. Significant BLE weakness, with poor coordination and awareness of especially LLE, requiring cues for max step length   Stairs             Wheelchair Mobility    Modified Rankin (Stroke Patients Only)       Balance Overall balance assessment: Needs assistance Sitting-balance support: Bilateral upper extremity supported;Feet supported Sitting balance-Leahy Scale: Fair Sitting balance - Comments: Required cues to scoot back at EOB and recliner to keep from sliding onto floor   Standing balance support: Bilateral upper extremity supported Standing balance-Leahy Scale: Poor                              Cognition Arousal/Alertness: Awake/alert Behavior During Therapy: Flat affect Overall Cognitive Status: Impaired/Different from baseline Area of Impairment: Orientation;Memory;Following commands;Safety/judgement;Problem solving;Attention;Awareness                 Orientation Level: Disoriented to;Time;Situation Current Attention Level: Sustained Memory: Decreased short-term memory Following Commands: Follows one step commands with increased time;Follows one step commands inconsistently Safety/Judgement: Decreased awareness of deficits;Decreased awareness of safety Awareness: Intellectual Problem Solving: Slow processing;Decreased initiation;Difficulty sequencing;Requires verbal cues;Requires tactile cues General Comments: Max encouragement to participate in feeding self. Pt not using L arm without cues; poor coordination with RUE when attempting to grab items from tray/from PT's hand. Difficult to reason with      Exercises       General  Comments        Pertinent Vitals/Pain Pain Assessment: Faces Faces Pain Scale: Hurts a little bit Pain Location: Did not state Pain Descriptors / Indicators: Grimacing Pain Intervention(s): Monitored during session    Home Living                      Prior Function            PT Goals (current goals can now be found in the care plan section) Acute Rehab PT Goals Patient Stated Goal: Eat applesauce PT Goal Formulation: With patient Time For Goal Achievement: 11/08/18 Potential to Achieve Goals: Good Progress towards PT goals: Progressing toward goals    Frequency    Min 3X/week      PT Plan Current plan remains appropriate    Co-evaluation PT/OT/SLP Co-Evaluation/Treatment: Yes Reason for Co-Treatment: Necessary to address cognition/behavior during functional activity;For patient/therapist safety;To address functional/ADL transfers PT goals addressed during session: Mobility/safety with mobility;Balance        AM-PAC PT "6 Clicks" Daily Activity  Outcome Measure  Difficulty turning over in bed (including adjusting bedclothes, sheets and blankets)?: Unable Difficulty moving from lying on back to sitting on the side of the bed? : Unable Difficulty sitting down on and standing up from a chair with arms (e.g., wheelchair, bedside commode, etc,.)?: Unable Help needed moving to and from a bed to chair (including a wheelchair)?: A Lot Help needed walking in hospital room?: A Lot Help needed climbing 3-5 steps with a railing? : Total 6 Click Score: 8    End of Session Equipment Utilized During Treatment: Gait belt Activity Tolerance: Patient tolerated treatment well;Patient limited by fatigue Patient left: in chair;with call bell/phone within reach;with chair alarm set Nurse Communication: Mobility status PT Visit Diagnosis: Unsteadiness on feet (R26.81);Other abnormalities of gait and mobility (R26.89);Muscle weakness (generalized) (M62.81)      Time: 9937-1696 PT Time Calculation (min) (ACUTE ONLY): 25 min  Charges:  $Gait Training: 8-22 mins                    Mabeline Caras, PT, DPT Acute Rehabilitation Services  Pager [phone] Office [place] 10/26/2018, 10:00 AM

## 2018-10-27 DIAGNOSIS — E876 Hypokalemia: Secondary | ICD-10-CM

## 2018-10-27 DIAGNOSIS — R1312 Dysphagia, oropharyngeal phase: Secondary | ICD-10-CM

## 2018-10-27 LAB — BASIC METABOLIC PANEL
Anion gap: 10 (ref 5–15)
BUN: 34 mg/dL — ABNORMAL HIGH (ref 6–20)
CHLORIDE: 104 mmol/L (ref 98–111)
CO2: 25 mmol/L (ref 22–32)
CREATININE: 0.8 mg/dL (ref 0.44–1.00)
Calcium: 9.3 mg/dL (ref 8.9–10.3)
Glucose, Bld: 102 mg/dL — ABNORMAL HIGH (ref 70–99)
POTASSIUM: 3.3 mmol/L — AB (ref 3.5–5.1)
SODIUM: 139 mmol/L (ref 135–145)

## 2018-10-27 LAB — GLUCOSE, CAPILLARY
Glucose-Capillary: 117 mg/dL — ABNORMAL HIGH (ref 70–99)
Glucose-Capillary: 131 mg/dL — ABNORMAL HIGH (ref 70–99)
Glucose-Capillary: 223 mg/dL — ABNORMAL HIGH (ref 70–99)
Glucose-Capillary: 157 mg/dL — ABNORMAL HIGH (ref 70–99)
Glucose-Capillary: 122 mg/dL — ABNORMAL HIGH (ref 70–99)

## 2018-10-27 LAB — CULTURE, BLOOD (ROUTINE X 2)
CULTURE: NO GROWTH
Culture: NO GROWTH
SPECIAL REQUESTS: ADEQUATE
SPECIAL REQUESTS: ADEQUATE

## 2018-10-27 MED ORDER — FUROSEMIDE 20 MG PO TABS
20.0000 mg | ORAL_TABLET | Freq: Every day | ORAL | Status: DC
  Filled 2018-10-27: qty 1

## 2018-10-27 MED ORDER — POTASSIUM CHLORIDE CRYS ER 20 MEQ PO TBCR
40.0000 meq | EXTENDED_RELEASE_TABLET | Freq: Once | ORAL | Status: AC
  Administered 2018-10-27: 40 meq via ORAL
  Filled 2018-10-27: qty 2

## 2018-10-27 MED ORDER — QUETIAPINE FUMARATE 300 MG PO TABS
300.0000 mg | ORAL_TABLET | Freq: Every day | ORAL | Status: DC
  Administered 2018-10-27: 300 mg via ORAL
  Filled 2018-10-27 (×2): qty 1

## 2018-10-27 MED ORDER — HYDROCODONE-ACETAMINOPHEN 5-325 MG PO TABS
2.0000 | ORAL_TABLET | Freq: Once | ORAL | Status: AC
  Administered 2018-10-27: 2 via ORAL
  Filled 2018-10-27: qty 2

## 2018-10-27 MED ORDER — PREDNISONE 20 MG PO TABS
40.0000 mg | ORAL_TABLET | Freq: Every day | ORAL | Status: DC
  Filled 2018-10-27: qty 2

## 2018-10-27 NOTE — Progress Notes (Addendum)
Inpatient Rehabilitation Admissions Coordinator  I have left another message for pt's spouse to contact me to discuss a possible inpt rehab admission. He returned call and requests me to contact him tomorrow morning with an interpreter to discuss. I will follow up in the morning.  Danne Baxter, RN, MSN Rehab Admissions Coordinator [phone] 10/27/2018 6:12 PM

## 2018-10-27 NOTE — Progress Notes (Signed)
Physical Therapy Treatment Patient Details Name: Deborah Melendez MRN: [id number] DOB: August 15, 1976 Today's Date: 10/27/2018    History of Present Illness Pt is a 42 y.o. female admitted 10/14/18 with asthmatic exacerbation, worsening hypoxia and encephalopathy. UDS (+) THC, cocaine, benzos. Chest CT 10/18 with R middle lobe collapse, bronchitis; no PE. Intub 10/18, self-extub x2 on 10/21; reintubated 10/21-10/28. PMH includes asthma, bipolar disorder.   PT Comments    Pt slowly progressing with mobility; ambulating short distance with RW and min-modA to prevent LOB. Pt with decreased awareness and poor attention contributing to difficulty following cues for safer movement patterns. Also with weakness and impaired balance strategies/postural reactions. Pt at significant risk for falls. Husband and daughter present; pt reports she may be able to stay with her mother upon d/c. Continue to recommend intensive CIR-level therapies to maximize functional mobility and independence prior to return home.   Follow Up Recommendations  CIR;Supervision/Assistance - 24 hour     Equipment Recommendations  (TBD pending d/c plan)    Recommendations for Other Services       Precautions / Restrictions Precautions Precautions: Fall Restrictions Weight Bearing Restrictions: No    Mobility  Bed Mobility               General bed mobility comments: Received sitting on EOB talking with family  Transfers Overall transfer level: Needs assistance Equipment used: Rolling walker (2 wheeled) Transfers: Sit to/from Stand Sit to Stand: Min assist         General transfer comment: Pt not following instructions to push from bed; modA to steady RW since pt pulling up on it. Pt initially with LOB back seated EOB, requiring minA to maintain standing balance on second standing trial  Ambulation/Gait Ambulation/Gait assistance: Min assist Gait Distance (Feet): 30 Feet Assistive device: Rolling  walker (2 wheeled) Gait Pattern/deviations: Step-through pattern;Staggering right;Staggering left;Leaning posteriorly;Scissoring Gait velocity: Decreased   General Gait Details: Consistent min-modA to maintain standing balance and steady RW; running into objects with RW, impulsive with this. Pt with scissoring gait and BLE instability, difficulty following cues to widen BOS. Fatigued, requesting to sit and attempting to sit although no chair; max cues for safety   Stairs             Wheelchair Mobility    Modified Rankin (Stroke Patients Only)       Balance Overall balance assessment: Needs assistance Sitting-balance support: Feet supported Sitting balance-Leahy Scale: Good   Postural control: Posterior lean Standing balance support: Bilateral upper extremity supported Standing balance-Leahy Scale: Poor Standing balance comment: Reliant on UE and external support                            Cognition Arousal/Alertness: Awake/alert Behavior During Therapy: Flat affect Overall Cognitive Status: Impaired/Different from baseline Area of Impairment: Orientation;Attention;Memory;Following commands;Safety/judgement;Awareness;Problem solving                 Orientation Level: Disoriented to;Time;Situation Current Attention Level: Sustained Memory: Decreased short-term memory Following Commands: Follows one step commands with increased time;Follows one step commands inconsistently Safety/Judgement: Decreased awareness of deficits;Decreased awareness of safety Awareness: Intellectual Problem Solving: Slow processing;Decreased initiation;Difficulty sequencing;Requires verbal cues;Requires tactile cues General Comments: Very poor safety awareness; easily distracted, difficulty with even sustained attention      Exercises      General Comments General comments (skin integrity, edema, etc.): Husband and daughter present during session      Pertinent  Vitals/Pain  Pain Assessment: No/denies pain    Home Living                      Prior Function            PT Goals (current goals can now be found in the care plan section) Acute Rehab PT Goals Patient Stated Goal: Not have thickened liquids PT Goal Formulation: With patient Time For Goal Achievement: 11/08/18 Potential to Achieve Goals: Good Progress towards PT goals: Progressing toward goals    Frequency    Min 3X/week      PT Plan Current plan remains appropriate    Co-evaluation              AM-PAC PT "6 Clicks" Daily Activity  Outcome Measure  Difficulty turning over in bed (including adjusting bedclothes, sheets and blankets)?: Unable Difficulty moving from lying on back to sitting on the side of the bed? : Unable Difficulty sitting down on and standing up from a chair with arms (e.g., wheelchair, bedside commode, etc,.)?: Unable Help needed moving to and from a bed to chair (including a wheelchair)?: A Little Help needed walking in hospital room?: A Lot Help needed climbing 3-5 steps with a railing? : Total 6 Click Score: 9    End of Session Equipment Utilized During Treatment: Gait belt Activity Tolerance: Patient tolerated treatment well;Patient limited by fatigue Patient left: in bed;with call bell/phone within reach;with family/visitor present Nurse Communication: Mobility status PT Visit Diagnosis: Unsteadiness on feet (R26.81);Other abnormalities of gait and mobility (R26.89);Muscle weakness (generalized) (M62.81)     Time: 2952-8413 PT Time Calculation (min) (ACUTE ONLY): 20 min  Charges:  $Gait Training: 8-22 mins                    Mabeline Caras, PT, DPT Acute Rehabilitation Services  Pager [phone] Office [place] 10/27/2018, 3:47 PM

## 2018-10-27 NOTE — Clinical Social Work Note (Signed)
Discussed patient in quality collaborative meeting this morning. Surveyor, quantity of social work Manufacturing systems engineer to work with patient as much as possible to prepare for potential need to discharge home. Patient has no insurance.  Deborah Melendez, [place]

## 2018-10-27 NOTE — Progress Notes (Addendum)
[facility] PROGRESS NOTE  Deborah Melendez XIH:[id number] DOB: 1976/02/23 DOA: 10/14/2018  PCP: Patient, No Pcp Per  Brief History/Interval Summary: 42 year old female admitted on 10/18 w/ working dx of asthmatic exacerbation w/ worsening hypoxia and encephalopathy. Intubated, required paralytics for ventilator synchrony. UDS positive for THC, cocaine & benzos.    Significant Hospital Events   10/18  Admitted, intubated 10/21  Self extubated x 2 on same day, re-intubated. ABG worse. Deeply sedated and paralyzed.  10/22  Hemoglobin low, Transfuse 1 unit. Nimbex stopped.  10/23  Got vent dysnch on sedation wua. HR 130 and wheezing. Agitated on WUA. Hypetensive 10/24  Wheezing improved after lasix.  Tolerating TF.  Mother indicates she smokes "a lot" 10/25  ETT low. Wheezy despite ET tube and diureses. EF 55% 10/26  Fent gtt/diprivan gtt -> agitation barrier to wean 10/28>> Extubated  Procedures (surgical and bedside):  ETT 10/18 >> 10/28  Significant Diagnostic Tests:  CTA Chest 10/18 >> no pulmonary embolus, right middle lobe collapse, bronchial wall thickening, bronchitis, mild basal atelectasis.  UDS 10/18 >> cocaine, benzos, marojiuana  Micro Data:  HIV 10/19 >> neg Sputum 10/20 >> group B strep Respiratory virus panel 10/8 >> negative  Antimicrobials:  Azithro 10/18 >> 10/23 Ampicillin 10/23 >> 10/27    Subjective/Interval History: Patient states that she feels well.  Had couple episodes of loose stool yesterday but none today so far.  Denies any shortness of breath or chest pain.    ROS: Denies any headaches  Objective:  Vital Signs  Vitals:   10/26/18 1953 10/27/18 0022 10/27/18 0418 10/27/18 0813  BP:  106/73 (!) 126/99   Pulse:  (!) 109 (!) 106   Resp:  20 20   Temp:  98.7 F (37.1 C) 98.3 F (36.8 C)   TempSrc:  Oral Oral   SpO2: 100% 98% 97% 99%  Weight:   66.5 kg   Height:        Intake/Output Summary (Last 24 hours) at  10/27/2018 1132 Last data filed at 10/27/2018 1054 Gross per 24 hour  Intake 960 ml  Output 1102 ml  Net -142 ml   Filed Weights   10/25/18 0400 10/26/18 0544 10/27/18 0418  Weight: 69 kg 67.9 kg 66.5 kg    General appearance: Awake alert.  Mildly distracted.  In no distress. Resp: Normal effort at rest.  Clear to auscultation bilaterally.  No wheezing rales or rhonchi. Cardio: S1-S2 is normal regular.  No S3-S4.  No rubs murmurs or bruit GI: Abdomen soft.  Nontender nondistended.  No masses organomegaly Extremities: No edema Neurologic: Cranial nerves II to XII intact.  Motor strength equal bilateral upper and lower extremities  Lab Results:  Data Reviewed: I have personally reviewed following labs and imaging studies  CBC: Recent Labs  Lab 10/22/18 0425 10/23/18 0407 10/24/18 0415 10/25/18 0438 10/26/18 0029  WBC 11.2* 11.3* 11.8* 21.6* 13.4*  NEUTROABS 9.5* 9.4* 10.1* 16.8* 8.6*  HGB 9.4* 9.5* 10.4* 11.3* 13.1  HCT 30.7* 31.3* 35.2* 37.7 43.1  MCV 88.0 88.4 88.2 89.1 88.3  PLT 280 284 269 279 349    Basic Metabolic Panel: Recent Labs  Lab 10/22/18 0425 10/23/18 0407 10/24/18 0415 10/25/18 0438 10/26/18 0029 10/27/18 0523  NA  --  138 139 141 142 139  K  --  4.0 3.8 3.0* 3.5 3.3*  CL  --  96* 100 102 99 104  CO2  --  35* 33* 29 29 25   GLUCOSE  --  176* 178* 117* 107* 102*  BUN  --  41* 44* 43* 39* 34*  CREATININE  --  0.94 0.80 0.81 0.79 0.80  CALCIUM  --  9.0 9.2 9.4 9.9 9.3  MG 2.3 2.3 2.1 2.2 2.2  --   PHOS 4.0 3.2 3.1 2.2* 2.4*  --     GFR: Estimated Creatinine Clearance: 86.8 mL/min (by C-G formula based on SCr of 0.8 mg/dL).  Liver Function Tests: Recent Labs  Lab 10/23/18 0407  AST 24  ALT 44  ALKPHOS 52  BILITOT 0.5  PROT 6.7  ALBUMIN 3.0*    Cardiac Enzymes: Recent Labs  Lab 10/22/18 1556  CKTOTAL 229  CKMB 1.4    CBG: Recent Labs  Lab 10/26/18 1645 10/26/18 2011 10/27/18 0019 10/27/18 0414 10/27/18 0734  GLUCAP  102* 176* 223* 117* 131*     Recent Results (from the past 240 hour(s))  Culture, blood (routine x 2)     Status: None (Preliminary result)   Collection Time: 10/22/18 10:21 PM  Result Value Ref Range Status   Specimen Description BLOOD RIGHT HAND  Final   Special Requests   Final    BOTTLES DRAWN AEROBIC ONLY Blood Culture adequate volume   Culture   Final    NO GROWTH 4 DAYS Performed at [facility], [place] [address], [place], [place] [address]    Report Status PENDING  Incomplete  Culture, blood (routine x 2)     Status: None (Preliminary result)   Collection Time: 10/22/18 10:40 PM  Result Value Ref Range Status   Specimen Description BLOOD LEFT ANTECUBITAL  Final   Special Requests   Final    BOTTLES DRAWN AEROBIC ONLY Blood Culture adequate volume   Culture   Final    NO GROWTH 4 DAYS Performed at [facility], [place] [address], [place], [place] [address]    Report Status PENDING  Incomplete  Culture, blood (routine x 2)     Status: None (Preliminary result)   Collection Time: 10/26/18 12:29 AM  Result Value Ref Range Status   Specimen Description   Final    BLOOD LEFT ANTECUBITAL Performed at [facility], [place] [address] [place], [place] [address]    Special Requests   Final    BOTTLES DRAWN AEROBIC ONLY Blood Culture adequate volume   Culture PENDING  Incomplete   Report Status PENDING  Incomplete      Radiology Studies: Dg Chest 1 View  Result Date: 10/26/2018 CLINICAL DATA:  Respiratory failure. Shortness of breath and weakness. Recent intubation. EXAM: CHEST  1 VIEW COMPARISON:  10/24/2018 and 10/23/2018. FINDINGS: 0818 hours. Interval extubation with removal of the central line and enteric tube. The heart size and mediastinal contours are normal. The lungs are clear. Azygos fissure noted. There is no pleural effusion or pneumothorax. No acute osseous findings are identified. Telemetry leads overlie the chest. IMPRESSION: No active  cardiopulmonary process. Electronically Signed   By: Richardean Sale M.D.   On: 10/26/2018 10:51     Medications:  Scheduled: . budesonide (PULMICORT) nebulizer solution  0.5 mg Nebulization BID  . chlorhexidine  15 mL Mouth Rinse BID  . famotidine  20 mg Oral BID  . furosemide  40 mg Intravenous Daily  . gabapentin  100 mg Oral BID  . heparin  5,000 Units Subcutaneous Q8H  . insulin aspart  0-15 Units Subcutaneous Q4H  . ipratropium-albuterol  3 mL Nebulization TID  . mouth rinse  15 mL Mouth Rinse BID  . mouth rinse  15 mL Mouth Rinse q12n4p  . methylPREDNISolone (SOLU-MEDROL) injection  40 mg Intravenous Daily  . montelukast  10 mg Oral QHS  . nystatin  5 mL Mouth/Throat QID  . polyethylene glycol  17 g Oral Daily  . potassium chloride  40 mEq Oral QHS  . QUEtiapine  300 mg Per Tube QHS  . sertraline  50 mg Oral QHS   Continuous: . sodium chloride Stopped (10/24/18 1715)   TML:YYTKPT chloride, acetaminophen (TYLENOL) oral liquid 160 mg/5 mL, bisacodyl, docusate, hydrALAZINE, RESOURCE THICKENUP CLEAR, sennosides  Assessment/Plan:    Acute respiratory failure with hypoxia/status asthmaticus Patient was in the intensive care unit.  She was intubated.  Successfully extubated on 10/28.  Remains at high risk for aspiration.  Speech therapy is following.  She is on a dysphagia diet.  Respiratory status is stable.  Chest x-ray was repeated on 10/30 and did not show any acute findings.  She is stable from a respiratory standpoint.  She is on nebulizer treatments with bronchodilators.  Also on nebulized budesonide.  We will change her over to oral steroids.  Continue Singulair.  There was also concern for fluid overload and the patient was started on IV diuretics.  Change to oral.  Acute bronchitis with group B strep Had a fever of 102.8 on 10/28.  Repeat blood cultures negative so far.  WBC was elevated at 21.6 but improved subsequently.  No further episodes of fever.  Cultures  negative so far.  She completed a course of antibiotics on 10/27.  Currently off of antibiotics.  Continue to monitor.    Acute metabolic versus toxic encephalopathy Mains distracted but stable.  History of bipolar disorder and depression Stable.  History of polysubstance abuse Noted to be on lorazepam.  Noted to be on Seroquel.  QT interval on last EKG from 10/28 was normal.  Patient also on gabapentin and sertraline.  Anemia of chronic disease Stable.  No evidence of overt bleeding.  Hyperglycemia Most likely secondary to steroids.  Improved now.  Hypokalemia Will be repleted.   DVT Prophylaxis: Subcutaneous heparin    Code Status: Full code Family Communication: Discussed with the patient Disposition Plan: Management as outlined above.  Continue to mobilize.  Placement will be an issue as patient has no insurance.  May need to go home with home health.    LOS: 13 days   [facility] Pager [phone] 10/27/2018, 11:32 AM  If 7PM-7AM, please contact night-coverage at [email or web address], password [facility]

## 2018-10-28 ENCOUNTER — Inpatient Hospital Stay (HOSPITAL_COMMUNITY): Payer: Self-pay

## 2018-10-28 DIAGNOSIS — J45902 Unspecified asthma with status asthmaticus: Secondary | ICD-10-CM

## 2018-10-28 LAB — CBC
HEMATOCRIT: 39.4 % (ref 36.0–46.0)
Hemoglobin: 12.3 g/dL (ref 12.0–15.0)
MCH: 26.9 pg (ref 26.0–34.0)
MCHC: 31.2 g/dL (ref 30.0–36.0)
MCV: 86 fL (ref 80.0–100.0)
NRBC: 0 % (ref 0.0–0.2)
PLATELETS: 245 10*3/uL (ref 150–400)
RBC: 4.58 MIL/uL (ref 3.87–5.11)
RDW: 19.1 % — AB (ref 11.5–15.5)
WBC: 10.4 10*3/uL (ref 4.0–10.5)

## 2018-10-28 LAB — BASIC METABOLIC PANEL
Anion gap: 9 (ref 5–15)
BUN: 28 mg/dL — ABNORMAL HIGH (ref 6–20)
CHLORIDE: 100 mmol/L (ref 98–111)
CO2: 24 mmol/L (ref 22–32)
CREATININE: 0.9 mg/dL (ref 0.44–1.00)
Calcium: 9 mg/dL (ref 8.9–10.3)
GFR calc non Af Amer: >60 mL/min (ref >60)
Glucose, Bld: 151 mg/dL — ABNORMAL HIGH (ref 70–99)
Potassium: 3.3 mmol/L — ABNORMAL LOW (ref 3.5–5.1)
Sodium: 133 mmol/L — ABNORMAL LOW (ref 135–145)

## 2018-10-28 LAB — GLUCOSE, CAPILLARY
GLUCOSE-CAPILLARY: 144 mg/dL — AB (ref 70–99)
GLUCOSE-CAPILLARY: 110 mg/dL — AB (ref 70–99)
Glucose-Capillary: 141 mg/dL — ABNORMAL HIGH (ref 70–99)

## 2018-10-28 LAB — MAGNESIUM: Magnesium: 1.9 mg/dL (ref 1.7–2.4)

## 2018-10-28 MED ORDER — POTASSIUM CHLORIDE CRYS ER 20 MEQ PO TBCR: 40.0000 meq | EXTENDED_RELEASE_TABLET | Freq: Every day | ORAL | 0 refills | Status: DC

## 2018-10-28 MED ORDER — GABAPENTIN 100 MG PO CAPS: 100.0000 mg | ORAL_CAPSULE | Freq: Two times a day (BID) | ORAL | 0 refills | Status: DC

## 2018-10-28 MED ORDER — POTASSIUM CHLORIDE CRYS ER 20 MEQ PO TBCR
40.0000 meq | EXTENDED_RELEASE_TABLET | Freq: Once | ORAL | Status: AC
  Administered 2018-10-28: 40 meq via ORAL
  Filled 2018-10-28: qty 2

## 2018-10-28 MED ORDER — POLYETHYLENE GLYCOL 3350 17 G PO PACK: 17.0000 g | PACK | Freq: Every day | ORAL | 0 refills | Status: DC

## 2018-10-28 MED ORDER — PHENOL 1.4 % MT LIQD: 1.0000 | OROMUCOSAL | Status: DC | PRN

## 2018-10-28 MED ORDER — MONTELUKAST SODIUM 10 MG PO TABS: 10.0000 mg | ORAL_TABLET | Freq: Every day | ORAL | 0 refills | Status: DC

## 2018-10-28 MED ORDER — FAMOTIDINE 20 MG PO TABS: 20.0000 mg | ORAL_TABLET | Freq: Two times a day (BID) | ORAL | 0 refills | Status: DC

## 2018-10-28 MED ORDER — NYSTATIN 100000 UNIT/ML MT SUSP: 5.0000 mL | Freq: Four times a day (QID) | OROMUCOSAL | 0 refills | Status: DC

## 2018-10-28 MED ORDER — IPRATROPIUM-ALBUTEROL 0.5-2.5 (3) MG/3ML IN SOLN: 3.0000 mL | Freq: Three times a day (TID) | RESPIRATORY_TRACT | 0 refills | Status: DC

## 2018-10-28 MED ORDER — FUROSEMIDE 20 MG PO TABS: 20.0000 mg | ORAL_TABLET | Freq: Every day | ORAL | 0 refills | Status: DC

## 2018-10-28 MED ORDER — PREDNISONE 10 MG PO TABS: ORAL_TABLET | ORAL | 0 refills | Status: DC

## 2018-10-28 MED ORDER — FLOVENT HFA 44 MCG/ACT IN AERO: 2.0000 | INHALATION_SPRAY | Freq: Two times a day (BID) | RESPIRATORY_TRACT | 0 refills | Status: DC

## 2018-10-28 NOTE — Progress Notes (Signed)
Modified Barium Swallow Progress Note  Patient Details  Name: Deborah Melendez MRN: [id number] Date of Birth: 1976/05/11  Today's Date: 10/28/2018  Modified Barium Swallow completed.  Full report located under Chart Review in the Imaging Section.  Brief recommendations include the following:  Clinical Impression  Pt's swallow function has significantly improved from prior instrumental assessment with FEES. Oral mastication of solid and transit across textures was functional. Laryngeal penetration, flash only, with straw use of thin barium due to increased velocity and inability to completely close laryngeal vestibule. Pharyngeal/laryngeal strength intact and no abnormalities noted when esophagus scanned. Upgrade texture to regular and liquids to thin, small sips especially with straw and pills with thin (one at a time). Will follow once more if here to reiterate swallow precautions.    Swallow Evaluation Recommendations       SLP Diet Recommendations: Regular solids;Thin liquid   Liquid Administration via: Straw;Cup   Medication Administration: Whole meds with liquid   Supervision: Patient able to self feed   Compensations: Slow rate;Small sips/bites   Postural Changes: Seated upright at 90 degrees   Oral Care Recommendations: Oral care BID        Houston Siren 10/28/2018,1:45 PM   Orbie Pyo [place].Ed Risk analyst [phone] Office [phone]

## 2018-10-28 NOTE — Plan of Care (Signed)
  Problem: Clinical Measurements: Goal: Ability to maintain clinical measurements within normal limits will improve Outcome: Progressing   Problem: Clinical Measurements: Goal: Respiratory complications will improve Outcome: Progressing   Problem: Clinical Measurements: Goal: Cardiovascular complication will be avoided Outcome: Progressing   

## 2018-10-28 NOTE — Care Management Note (Signed)
Case Management Note  Patient Details  Name: Haivyn Oravec MRN: [id number] Date of Birth: [birth year]  Subjective/Objective:  Acute on Chronic Respiratory Failure                 Action/Plan: Patient is refusing to go to [place] or SNF at discharge and requesting to return home at discharge.She lives at home with her boyfriend and child. No medical insurance. Patient needs HHPT, she is agreeable to have [facility]; Dan with [facility] called for arrangements; he will determine to see if she will qualify for their charity program.Follow up apt scheduled with the [facility] for Nov 8,2019 at 9 am; She qualifies for the [employer] ( Medication Assistance Through [facility]) and her meds will be filled by the [facility] prior to discharging home.  Expected Discharge Date:       10/28/2018           Expected Discharge Plan:  [place]  In-House Referral:  Clinical Social Work  Discharge planning Services  CM Consult  Choice offered to:  Patient  HH Arranged:  PT Why Agency:  [facility]  Status of Service:  In process, will continue to follow   Sherrilyn Rist [phone] 10/28/2018, 2:28 PM

## 2018-10-28 NOTE — Progress Notes (Signed)
  Speech Language Pathology Treatment: Dysphagia  Patient Details Name: Deborah Melendez MRN: [id number] DOB: 1976-01-23 Today's Date: 10/28/2018 Time: 3300-7622 SLP Time Calculation (min) (ACUTE ONLY): 11 min  Assessment / Plan / Recommendation Clinical Impression  Pt's endurance and strength appear to have improved and she voices dislike of thickened liquids. FEES completed 3 days ago. Given suspected improvements in pharyngeal swallow ability, will repeat instrumental evaluation today at 12:00 with MBS. After oral care, pt given thin water- no s/s aspiration however pt silently penetrated during FEES. Hopefully will be able to upgrade her diet and liquids today.    HPI HPI: Pt is a 42 y.o. female admitted on 10/18 with working dx of asthmatic exacerbation, VCD with worsening hypoxia and encephalopathy. She had recent admission and d/c about 1 week prior after being admitted for acute bronchitis and asthmatic exacerbation. PMH significant for cocaine abuse, bipolar disease, depression, asthma. Intubated 10/18-10/28 (self extubated X2 on 10/18). Per MD note 10/19, suspect component of upper airway disease/VCD contributing given presenting loud audible wheezing from bedside and heavy psychiatric history. CXR = no active disease.      SLP Plan  MBS       Recommendations  Diet recommendations: Dysphagia 2 (fine chop);Nectar-thick liquid Liquids provided via: Cup;No straw Medication Administration: Whole meds with puree Supervision: Patient able to self feed;Full supervision/cueing for compensatory strategies Compensations: Minimize environmental distractions;Slow rate;Small sips/bites;Lingual sweep for clearance of pocketing Postural Changes and/or Swallow Maneuvers: Seated upright 90 degrees                Oral Care Recommendations: Oral care BID Follow up Recommendations: None SLP Visit Diagnosis: Dysphagia, oropharyngeal phase (R13.12) Plan: MBS                        Layci Stenglein, Orbie Pyo 10/28/2018, 10:50 AM   Orbie Pyo [place].Ed Risk analyst [phone] Office [phone]

## 2018-10-28 NOTE — Progress Notes (Signed)
PT Cancellation Note  Patient Details Name: Saylah Ketner MRN: [id number] DOB: 12/27/1976   Cancelled Treatment:    Reason Eval/Treat Not Completed: Patient declined, no reason specified attempted to work with patient, she politely declines stating she has not eaten yet and is waiting for them to bring her lunch. Willing to participate later after she eats. Will attempt to check back later if time/schedule allow.    Deniece Ree PT, DPT, CBIS  Supplemental Physical Therapist [facility]    Pager [phone] Acute Rehab Office [phone]

## 2018-10-28 NOTE — Progress Notes (Signed)
Patient is declining all po oral rinse agents and medications.  She states she just wants to sleep.  No distress noted.    Patient did allow insulin shot to be administered.

## 2018-10-28 NOTE — Progress Notes (Signed)
Inpatient Rehabilitation Admissions Coordinator  I met with patient at bedside and then per her request contacted her mom by phone. Patient is requesting to d/c home and requesting today. She states she has a friend, Jim, in [place] that she is speaking to by phone, to request to go to his home. She does not want further rehab. I have updated SW. We will sign off at this time.   , RN, MSN Rehab Admissions Coordinator [phone] 10/28/2018 10:48 AM'   

## 2018-10-28 NOTE — Clinical Social Work Note (Signed)
Patient's family refusing to pick her up and she has no money to pay for a cab. RN confirmed address. Filled out cab voucher and will give to nurse secretary.  CSW signing off.  Dayton Scrape, [place]

## 2018-10-28 NOTE — Discharge Summary (Signed)
[facility]  Physician Discharge Summary   Patient ID: Deborah Melendez MRN: [id number] DOB/AGE: 1976-12-28 42 y.o.  Admit date: 10/14/2018 Discharge date: 10/28/2018  PCP: Patient, No Pcp Per  DISCHARGE DIAGNOSES:  Status asthmaticus, resolved Acute bronchitis with group B streptococcus Acute metabolic encephalopathy, improved History of bipolar disorder and depression History of polysubstance abuse  RECOMMENDATIONS FOR OUTPATIENT FOLLOW UP: 1. Patient asked to follow-up with outpatient providers   DISCHARGE CONDITION: fair  Diet recommendation: Regular diet as tolerated  Filed Weights   10/26/18 0544 10/27/18 0418 10/27/18 2027  Weight: 67.9 kg 66.5 kg 67.7 kg    INITIAL HISTORY: 42 year old female admitted on 10/18 w/ working dx of asthmatic exacerbation w/ worsening hypoxia and encephalopathy. Intubated, required paralytics for ventilator synchrony. UDS positive for THC, cocaine &benzos.    Significant Hospital Events   10/18 Admitted, intubated 10/21 Self extubated x 2 on same day, re-intubated. ABG worse. Deeply sedated and paralyzed.  10/22 Hemoglobin low, Transfuse 1 unit. Nimbex stopped.  10/23 Got vent dysnch on sedation wua. HR 130 and wheezing. Agitated on WUA. Hypetensive 10/24 Wheezing improved after lasix. Tolerating TF. Mother indicates she smokes "a lot" 10/25 ETT low. Wheezy despite ET tube and diureses. EF 55% 10/26 Fent gtt/diprivan gtt ->agitation barrier to wean 10/28>> Extubated  Procedures (surgical and bedside):  ETT 10/18 >>10/28  Significant Diagnostic Tests:  CTA Chest 10/18 >>no pulmonary embolus, right middle lobe collapse, bronchial wall thickening, bronchitis, mild basal atelectasis.  UDS 10/18 >> cocaine, benzos, marojiuana  Micro Data:  HIV 10/19 >> neg Sputum 10/20 >> group B strep Respiratory virus panel 10/8 >> negative   HOSPITAL COURSE:   Acute respiratory failure with hypoxia/status  asthmaticus Patient was in the intensive care unit.  She was intubated.  Successfully extubated on 10/28.  Chest x-ray was repeated on 10/30 and did not show any acute findings.  She is stable from a respiratory standpoint.    She has improved with treatment including nebulizer treatments, nebulized budesonide and systemic steroids.  She was also placed on Singulair.  She was also given IV diuretics due to fluid overload.  She is saturating normal on room air.  Steroids will be tapered down gradually. Seen by speech therapy.  Initially was on a dysphagia diet.  However as her mentation and respiratory status has improved her ability to swallow has also improved.  Cleared for regular diet after she underwent a modified barium swallow.  Acute bronchitis with group B strep Had a fever of 102.8 on 10/28.  Repeat blood cultures negative.  WBC was elevated at 21.6 but improved subsequently. No further episodes of fever.  She completed a course of antibiotics on 10/27.  Currently off of antibiotics.    Acute metabolic versus toxic encephalopathy Mental status has improved and possibly close to baseline now.    History of bipolar disorder and depression Stable.  Continue home medications  History of polysubstance abuse Stable.  Counseled  Anemia of chronic disease Stable.  No evidence of overt bleeding.  Hyperglycemia Most likely secondary to steroids.  Improved now.  Hypokalemia She will be given a potassium supplement on a daily basis while she is on diuretics.  Patient was seen by physical therapy.  Inpatient rehab versus skilled nursing facility was recommended.  Patient declines both and wishes to go home.  She does not have any insurance which creates challenge.  Case manager to try and arrange home health for this patient.  Medically she has  improved.  She is stable for discharge today.   PERTINENT LABS:  The results of significant diagnostics from this hospitalization  (including imaging, microbiology, ancillary and laboratory) are listed below for reference.    Microbiology: Recent Results (from the past 240 hour(s))  Culture, blood (routine x 2)     Status: None   Collection Time: 10/22/18 10:21 PM  Result Value Ref Range Status   Specimen Description BLOOD RIGHT HAND  Final   Special Requests   Final    BOTTLES DRAWN AEROBIC ONLY Blood Culture adequate volume   Culture   Final    NO GROWTH 5 DAYS Performed at [facility], [address] [place], [place] [address]    Report Status 10/27/2018 FINAL  Final  Culture, blood (routine x 2)     Status: None   Collection Time: 10/22/18 10:40 PM  Result Value Ref Range Status   Specimen Description BLOOD LEFT ANTECUBITAL  Final   Special Requests   Final    BOTTLES DRAWN AEROBIC ONLY Blood Culture adequate volume   Culture   Final    NO GROWTH 5 DAYS Performed at [facility], [place] [address] [place], [place] [address]    Report Status 10/27/2018 FINAL  Final  Culture, blood (routine x 2)     Status: None (Preliminary result)   Collection Time: 10/26/18 12:29 AM  Result Value Ref Range Status   Specimen Description BLOOD LEFT ANTECUBITAL  Final   Special Requests   Final    BOTTLES DRAWN AEROBIC ONLY Blood Culture adequate volume   Culture   Final    NO GROWTH 2 DAYS Performed at [facility], [place] [address] [place], [place] [address]    Report Status PENDING  Incomplete  Culture, blood (routine x 2)     Status: None (Preliminary result)   Collection Time: 10/26/18 12:36 AM  Result Value Ref Range Status   Specimen Description BLOOD RIGHT HAND  Final   Special Requests   Final    BOTTLES DRAWN AEROBIC AND ANAEROBIC Blood Culture adequate volume   Culture   Final    NO GROWTH 2 DAYS Performed at [facility], [place] [address], [place], [place] [address]    Report Status PENDING  Incomplete     Labs: Basic Metabolic Panel: Recent Labs  Lab 10/22/18 0425  10/23/18 0407 10/24/18 6378 10/25/18 5885 10/26/18 0029 10/27/18 0523 10/28/18 0502  NA  --  138 139 141 142 139 133*  K  --  4.0 3.8 3.0* 3.5 3.3* 3.3*  CL  --  96* 100 102 99 104 100  CO2  --  35* 33* 29 29 25 24   GLUCOSE  --  176* 178* 117* 107* 102* 151*  BUN  --  41* 44* 43* 39* 34* 28*  CREATININE  --  0.94 0.80 0.81 0.79 0.80 0.90  CALCIUM  --  9.0 9.2 9.4 9.9 9.3 9.0  MG 2.3 2.3 2.1 2.2 2.2  --  1.9  PHOS 4.0 3.2 3.1 2.2* 2.4*  --   --    Liver Function Tests: Recent Labs  Lab 10/23/18 0407  AST 24  ALT 44  ALKPHOS 52  BILITOT 0.5  PROT 6.7  ALBUMIN 3.0*   CBC: Recent Labs  Lab 10/22/18 0425 10/23/18 0407 10/24/18 0415 10/25/18 0438 10/26/18 0029 10/28/18 0502  WBC 11.2* 11.3* 11.8* 21.6* 13.4* 10.4  NEUTROABS 9.5* 9.4* 10.1* 16.8* 8.6*  --  HGB 9.4* 9.5* 10.4* 11.3* 13.1 12.3  HCT 30.7* 31.3* 35.2* 37.7 43.1 39.4  MCV 88.0 88.4 88.2 89.1 88.3 86.0  PLT 280 284 269 279 259 245   Cardiac Enzymes: Recent Labs  Lab 10/22/18 1556  CKTOTAL 229  CKMB 1.4   BNP: BNP (last 3 results) Recent Labs    10/14/18 0930  BNP 28.3   CBG: Recent Labs  Lab 10/27/18 1143 10/27/18 1638 10/28/18 0441 10/28/18 0722 10/28/18 1112  GLUCAP 157* 122* 144* 110* 141*     IMAGING STUDIES Dg Chest 1 View  Result Date: 10/26/2018 CLINICAL DATA:  Respiratory failure. Shortness of breath and weakness. Recent intubation. EXAM: CHEST  1 VIEW COMPARISON:  10/24/2018 and 10/23/2018. FINDINGS: 0818 hours. Interval extubation with removal of the central line and enteric tube. The heart size and mediastinal contours are normal. The lungs are clear. Azygos fissure noted. There is no pleural effusion or pneumothorax. No acute osseous findings are identified. Telemetry leads overlie the chest. IMPRESSION: No active cardiopulmonary process. Electronically Signed   By: Richardean Sale M.D.   On: 10/26/2018 10:51   Dg Chest 2 View  Result Date: 10/04/2018 CLINICAL DATA:   Chest pain, productive cough. EXAM: CHEST - 2 VIEW COMPARISON:  Radiographs of October 03, 2018. FINDINGS: The heart size and mediastinal contours are within normal limits. Both lungs are clear. No pneumothorax or pleural effusion is noted. The visualized skeletal structures are unremarkable. IMPRESSION: No active cardiopulmonary disease. Electronically Signed   By: Marijo Conception, M.D.   On: 10/04/2018 13:45   Dg Chest 2 View  Result Date: 10/03/2018 CLINICAL DATA:  Shortness of breath, URI symptoms for several days. Current smoker. History of asthma. EXAM: CHEST - 2 VIEW COMPARISON:  Chest x-ray of April 19, 2018 FINDINGS: The lungs are mildly hyperinflated. There is an azygos lobe anatomy on the right. There is no focal infiltrate. There is no pleural effusion. The heart and pulmonary vascularity are normal. The mediastinum is normal in width. The trachea is midline. IMPRESSION: Mild hyperinflation consistent with known reactive airway disease. No pneumonia nor other acute cardiopulmonary abnormality. Electronically Signed   By: David  Martinique M.D.   On: 10/03/2018 10:55   Ct Angio Chest Pe W Or Wo Contrast  Result Date: 10/14/2018 CLINICAL DATA:  Shortness of breath.  Wheezing. EXAM: CT ANGIOGRAPHY CHEST WITH CONTRAST TECHNIQUE: Multidetector CT imaging of the chest was performed using the standard protocol during bolus administration of intravenous contrast. Multiplanar CT image reconstructions and MIPs were obtained to evaluate the vascular anatomy. CONTRAST:  143mL ISOVUE-370 IOPAMIDOL (ISOVUE-370) INJECTION 76% COMPARISON:  None. FINDINGS: Cardiovascular: Satisfactory opacification of the pulmonary arteries to the segmental level. No evidence of pulmonary embolism. Normal heart size. No pericardial effusion. Mediastinum/Nodes: No enlarged mediastinal, hilar, or axillary lymph nodes. Thyroid gland, trachea, and esophagus demonstrate no significant findings. Endotracheal tube 3.2 cm above the carina.  Lungs/Pleura: Right middle lobe collapse. Bandlike area of airspace disease at bilateral lung bases likely reflecting atelectasis. No pleural effusion or pneumothorax. Mild bronchial wall thickening as can be seen with bronchitis. Upper Abdomen: No acute abnormality. Nasogastric tube with the tip in the stomach. Musculoskeletal: No acute osseous abnormality. No aggressive osseous lesion. Review of the MIP images confirms the above findings. IMPRESSION: 1. No evidence pulmonary embolus. 2. Right middle lobe collapse. No endobronchial obstructing lesion is identified. 3. Mild bronchial wall thickening as can be seen with bronchitis. 4. Mild bibasilar atelectasis. 5. Endotracheal tube in satisfactory position.  Nasogastric tube in satisfactory position. Electronically Signed   By: Kathreen Devoid   On: 10/14/2018 14:04   Dg Chest Port 1 View  Result Date: 10/24/2018 CLINICAL DATA:  Check endotracheal tube placement EXAM: PORTABLE CHEST 1 VIEW COMPARISON:  10/23/2018 FINDINGS: Cardiac shadows within normal limits., Endotracheal tube, nasogastric catheter left jugular central line are again seen and stable. The lungs are clear. No bony abnormality is noted. IMPRESSION: No active disease. Electronically Signed   By: Inez Catalina M.D.   On: 10/24/2018 08:39   Dg Chest Port 1 View  Result Date: 10/23/2018 CLINICAL DATA:  Intubated. EXAM: PORTABLE CHEST 1 VIEW COMPARISON:  10/21/2018. FINDINGS: The endotracheal tube has been repositioned and is in satisfactory position now. Stable left jugular catheter with its tip in the superior vena cava. Normal sized heart and clear lungs. Stable mild peribronchial thickening. Unremarkable bones. IMPRESSION: 1. Endotracheal tube in satisfactory position. 2. Stable mild chronic bronchitic changes. Electronically Signed   By: Claudie Revering M.D.   On: 10/23/2018 10:11   Dg Chest Port 1 View  Result Date: 10/21/2018 CLINICAL DATA:  41 year old female with a history of endotracheal  tube and respiratory failure EXAM: PORTABLE CHEST 1 VIEW COMPARISON:  10/20/2018 FINDINGS: Cardiomediastinal silhouette unchanged in size and contour. Endotracheal tube has become further advanced, now terminating just above the carina and directed towards the right mainstem. Unchanged left IJ central venous catheter, terminating superior vena cava. Unchanged gastric tube terminating out of the field of view. Lung volumes adequate with no confluent airspace disease large pleural effusion or pneumothorax. IMPRESSION: Well aerated lungs. Endotracheal tube terminates at the carina, and may be withdrawn 4-5 cm for better position. These results were called by telephone at the time of interpretation on 10/21/2018 at 10:52 am to nurse caring for the patient Ms Rosario Jacks. Unchanged left IJ central catheter and gastric tube. Electronically Signed   By: Corrie Mckusick D.O.   On: 10/21/2018 10:53   Dg Chest Port 1 View  Result Date: 10/20/2018 CLINICAL DATA:  Evaluate endotracheal tube, respiratory failure EXAM: PORTABLE CHEST 1 VIEW COMPARISON:  Portable chest x-ray of 10/19/2018 and 10/18/2018 FINDINGS: The carina is very difficult to visualize with the patient is somewhat rotated, but the tip of the endotracheal tube appears to be approximately 3.6 cm above the carina. The lungs appear well aerated. Left IJ central venous line tip overlies the mid lower SVC. No pneumothorax is seen. The heart is within normal limits in size. IMPRESSION: 1. Carina is very difficult to visualize but the endotracheal tube tip appears to be approximately 3.6 cm above the carina. 2. Left IJ central venous line tip overlies the mid lower SVC. No pneumothorax. Electronically Signed   By: Ivar Drape M.D.   On: 10/20/2018 10:41   Dg Chest Port 1 View  Result Date: 10/19/2018 CLINICAL DATA:  ET tube present.  Respiratory failure. EXAM: PORTABLE CHEST 1 VIEW COMPARISON:  10/18/2018. FINDINGS: Support tubes and lines are unchanged.  Veiling opacity RIGHT hemithorax could represent a layering effusion. LEFT lung is clear. IMPRESSION: Veiling opacity RIGHT hemithorax is new, and could represent a layering effusion. Electronically Signed   By: Staci Righter M.D.   On: 10/19/2018 10:42   Dg Chest Port 1 View  Result Date: 10/18/2018 CLINICAL DATA:  Encounter for central line placement EXAM: PORTABLE CHEST 1 VIEW COMPARISON:  10/18/2018 FINDINGS: The endotracheal tube is in place, tip approximately 2.5 centimeters above the carina. A LEFT IJ central line has  been placed, tip overlying the level of the superior vena cava. Nasogastric tube is in place, tip beyond the level of the mid stomach off the image. Heart size is normal. The lungs are clear. There is no pneumothorax. IMPRESSION: Interval placement of LEFT IJ central line.  No pneumothorax. Electronically Signed   By: Nolon Nations M.D.   On: 10/18/2018 16:58   Dg Chest Port 1 View  Result Date: 10/18/2018 CLINICAL DATA:  Endotracheal tube present, acute respiratory failure, asthma EXAM: PORTABLE CHEST 1 VIEW COMPARISON:  Portable chest x-ray of 10/17/2017 FINDINGS: The tip of the endotracheal tube is only 5 mm above the carina and needs to be with tract by approximately 2 cm. The lungs are not as well aerated and there are is some prominence of pulmonary vascularity. Is there any suspicion of developing pulmonary vascular congestion?Marland Kitchen No pneumonia or effusion is seen. NG tube extends into the stomach. IMPRESSION: 1. Tip of endotracheal tube only 5 mm above the carina. Recommend retracting by approximately 2 cm. 2. Somewhat prominent vascularity throughout the lungs may indicate mild pulmonary vascular congestion in the proper clinical setting. Correlate clinically. Electronically Signed   By: Ivar Drape M.D.   On: 10/18/2018 10:06   Dg Chest Port 1 View  Result Date: 10/17/2018 CLINICAL DATA:  Intubation EXAM: PORTABLE CHEST 1 VIEW COMPARISON:  10/17/2018 at 0222 hours  FINDINGS: Endotracheal tube terminates 3 cm above the carina. Lungs are clear.  Straight effusion The heart is normal in size. Enteric tube courses below the diaphragm. IMPRESSION: Endotracheal tube terminates 3 cm above the carina. Electronically Signed   By: Julian Hy M.D.   On: 10/17/2018 10:23   Dg Chest Port 1 View  Result Date: 10/17/2018 CLINICAL DATA:  Acute respiratory failure with hypoxia. EXAM: PORTABLE CHEST 1 VIEW COMPARISON:  Radiograph yesterday at 0643 hour.  Chest CT 10/14/2018 FINDINGS: Endotracheal tube tip 3.8 cm from the carina. Enteric tube in place with tip below the diaphragm. Anti lordotic positioning with mild rotation. Improved atelectasis in the right midlung. Right middle lobe collapse demonstrated on recent CT is not well appreciated radiographically. No acute airspace disease, pleural fluid or pneumothorax. Azygos fissure is noted. IMPRESSION: 1. Support apparatus unchanged. 2. Improved right lung atelectasis.  No new abnormality. Electronically Signed   By: Keith Rake M.D.   On: 10/17/2018 02:47   Dg Chest Port 1 View  Result Date: 10/16/2018 CLINICAL DATA:  Acute respiratory failure EXAM: PORTABLE CHEST 1 VIEW COMPARISON:  Yesterday FINDINGS: Endotracheal tube tip just below the clavicular heads. An orogastric tube reaches the stomach. Mild atelectasis has developed along the right minor fissure. The lungs are otherwise clear. IMPRESSION: 1. Stable hardware positioning. 2. New mild atelectasis along the minor fissure. Electronically Signed   By: Monte Fantasia M.D.   On: 10/16/2018 14:26   Dg Chest Port 1 View  Result Date: 10/15/2018 CLINICAL DATA:  42 year old with acute respiratory failure. EXAM: PORTABLE CHEST 1 VIEW COMPARISON:  10/14/2018 and CT from 10/14/2018 FINDINGS: Endotracheal tube is 4.5 cm above the carina. Nasogastric tube extends into the abdomen. Negative for pneumothorax. Lungs are well aerated. Recent chest CT demonstrated collapse or  volume loss in the right middle lobe which is not well appreciated on this AP view. Heart and mediastinum are within normal limits. IMPRESSION: 1. Endotracheal tube is appropriately positioned. 2. Stable appearance of the lungs. Electronically Signed   By: Markus Daft M.D.   On: 10/15/2018 08:49   Dg Chest  Portable 1 View  Result Date: 10/14/2018 CLINICAL DATA:  Check endotracheal tube placement EXAM: PORTABLE CHEST 1 VIEW COMPARISON:  Film from earlier in the same day. FINDINGS: Cardiac shadows within normal limits. Endotracheal tube and nasogastric catheter are now noted in satisfactory position. The lungs are well aerated bilaterally. No focal infiltrate or sizable effusion is seen. No bony abnormality is noted. IMPRESSION: Status post intubation and nasogastric catheter placement with satisfactory position. No other change from the prior exam is noted. Electronically Signed   By: Inez Catalina M.D.   On: 10/14/2018 12:26   Dg Chest Portable 1 View  Result Date: 10/14/2018 CLINICAL DATA:  Shortness of breath, bronchitis EXAM: PORTABLE CHEST 1 VIEW COMPARISON:  10/04/2018 FINDINGS: The heart size and mediastinal contours are within normal limits. Both lungs are clear. The visualized skeletal structures are unremarkable. Normal heart size and vascularity. Trachea is midline. Azygos fissure noted in the right upper lobe. IMPRESSION: No active disease. Electronically Signed   By: Jerilynn Mages.  Shick M.D.   On: 10/14/2018 09:53   Dg Abd Portable 1v  Result Date: 10/17/2018 CLINICAL DATA:  OG tube placement EXAM: PORTABLE ABDOMEN - 1 VIEW COMPARISON:  10/17/2018 at 0227 hours FINDINGS: Enteric tube terminates in the proximal duodenum. IMPRESSION: Enteric tube terminates in the proximal duodenum. Electronically Signed   By: Julian Hy M.D.   On: 10/17/2018 10:22   Dg Abd Portable 1v  Result Date: 10/17/2018 CLINICAL DATA:  OG tube placement. EXAM: PORTABLE ABDOMEN - 1 VIEW COMPARISON:  Chest radiograph  yesterday at 0643 hour. FINDINGS: Tip and side port of the enteric tube below the diaphragm in the stomach. No bowel dilatation in the upper abdomen. IMPRESSION: Tip and side port of the enteric tube below the diaphragm in the stomach. Electronically Signed   By: Keith Rake M.D.   On: 10/17/2018 02:45   Dg Swallowing Func-speech Pathology  Result Date: 10/28/2018 Objective Swallowing Evaluation: Type of Study: MBS-Modified Barium Swallow Study  Patient Details Name: Deborah Melendez MRN: [id number] Date of Birth: 08-17-1976 Today's Date: 10/28/2018 Time: SLP Start Time (ACUTE ONLY): 7062 -SLP Stop Time (ACUTE ONLY): 1258 SLP Time Calculation (min) (ACUTE ONLY): 15 min Past Medical History: Past Medical History: Diagnosis Date . Bipolar 1 disorder, depressed, severe ([place]) 02/04/2017 . Cocaine abuse with cocaine-induced mood disorder ([place]) 07/27/2017 . Depression  . Homicidal ideations  . Manic behavior ([place])  . MDD (major depressive disorder), recurrent severe, without psychosis ([place]) 08/08/2015 . 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 Suicidal ideation  Past Surgical History: Past Surgical History: Procedure Laterality Date . FINGER SURGERY   HPI: Pt is a 42 y.o. female admitted on 10/18 with working dx of asthmatic exacerbation, VCD with worsening hypoxia and encephalopathy. She had recent admission and d/c about 1 week prior after being admitted for acute bronchitis and asthmatic exacerbation. PMH significant for cocaine abuse, bipolar disease, depression, asthma. Intubated 10/18-10/28 (self extubated X2 on 10/18). Per MD note 10/19, suspect component of upper airway disease/VCD contributing given presenting loud audible wheezing from bedside and heavy psychiatric history. CXR = no active disease.  No data recorded Assessment / Plan / Recommendation CHL IP CLINICAL IMPRESSIONS 10/28/2018 Clinical Impression Pt's swallow function has  significantly improved from prior instrumental assessment with FEES. Oral mastication of solid and transit across textures was functional. Laryngeal penetration, flash only, with straw use of thin barium due to increased velocity and inability to completely close laryngeal vestibule.  Pharyngeal/laryngeal strength intact and no abnormalities noted when esophagus scanned. Upgrade texture to regular and liquids to thin, small sips especially with straw and pills with thin (one at a time). Will follow once more if here to reiterate swallow precautions.  SLP Visit Diagnosis Dysphagia, unspecified (R13.10) Attention and concentration deficit following -- Frontal lobe and executive function deficit following -- Impact on safety and function Mild aspiration risk   CHL IP TREATMENT RECOMMENDATION 10/28/2018 Treatment Recommendations Therapy as outlined in treatment plan below   Prognosis 10/25/2018 Prognosis for Safe Diet Advancement Good Barriers to Reach Goals -- Barriers/Prognosis Comment -- CHL IP DIET RECOMMENDATION 10/28/2018 SLP Diet Recommendations Regular solids;Thin liquid Liquid Administration via Straw;Cup Medication Administration Whole meds with liquid Compensations Slow rate;Small sips/bites Postural Changes Seated upright at 90 degrees   CHL IP OTHER RECOMMENDATIONS 10/28/2018 Recommended Consults -- Oral Care Recommendations Oral care BID Other Recommendations --   CHL IP FOLLOW UP RECOMMENDATIONS 10/28/2018 Follow up Recommendations None   CHL IP FREQUENCY AND DURATION 10/28/2018 Speech Therapy Frequency (ACUTE ONLY) min 1 x/week Treatment Duration 1 week      CHL IP ORAL PHASE 10/28/2018 Oral Phase WFL Oral - Pudding Teaspoon -- Oral - Pudding Cup -- Oral - Honey Teaspoon -- Oral - Honey Cup -- Oral - Nectar Teaspoon -- Oral - Nectar Cup -- Oral - Nectar Straw -- Oral - Thin Teaspoon -- Oral - Thin Cup -- Oral - Thin Straw -- Oral - Puree -- Oral - Mech Soft -- Oral - Regular -- Oral - Multi-Consistency -- Oral  - Pill -- Oral Phase - Comment --  CHL IP PHARYNGEAL PHASE 10/28/2018 Pharyngeal Phase WFL Pharyngeal- Pudding Teaspoon -- Pharyngeal -- Pharyngeal- Pudding Cup -- Pharyngeal -- Pharyngeal- Honey Teaspoon -- Pharyngeal -- Pharyngeal- Honey Cup -- Pharyngeal -- Pharyngeal- Nectar Teaspoon -- Pharyngeal -- Pharyngeal- Nectar Cup NT Pharyngeal -- Pharyngeal- Nectar Straw -- Pharyngeal -- Pharyngeal- Thin Teaspoon -- Pharyngeal -- Pharyngeal- Thin Cup Penetration/Aspiration during swallow Pharyngeal Material enters airway, remains ABOVE vocal cords then ejected out Pharyngeal- Thin Straw -- Pharyngeal -- Pharyngeal- Puree NT Pharyngeal -- Pharyngeal- Mechanical Soft -- Pharyngeal -- Pharyngeal- Regular WFL Pharyngeal -- Pharyngeal- Multi-consistency -- Pharyngeal -- Pharyngeal- Pill -- Pharyngeal -- Pharyngeal Comment --  [facility] IP CERVICAL ESOPHAGEAL PHASE 10/28/2018 Cervical Esophageal Phase WFL Pudding Teaspoon -- Pudding Cup -- Honey Teaspoon -- Honey Cup -- Nectar Teaspoon -- Nectar Cup -- Nectar Straw -- Thin Teaspoon -- Thin Cup -- Thin Straw -- Puree -- Mechanical Soft -- Regular -- Multi-consistency -- Pill -- Cervical Esophageal Comment -- [place] Siren 10/28/2018, 1:45 PM Orbie Pyo Litaker M.Ed Actor Pager [phone] Office [phone]              [place] Ekg Site Rite  Result Date: 10/17/2018 If Site Rite image not attached, placement could not be confirmed due to current cardiac rhythm.   DISCHARGE EXAMINATION: Vitals:   10/28/18 0400 10/28/18 0804 10/28/18 1114 10/28/18 1306  BP: (!) 123/94  98/70   Pulse: (!) 115  (!) 108   Resp: 18  20   Temp: 98.6 F (37 C)  98.7 F (37.1 C)   TempSrc: Oral  Oral   SpO2: 100% 97% 100% 100%  Weight:      Height:       General appearance: alert, cooperative, appears stated age and no distress Resp: clear to auscultation bilaterally Cardio: regular rate and rhythm, S1, S2 normal, no murmur, click, rub or gallop GI:  soft,  non-tender; bowel sounds normal; no masses,  no organomegaly  DISPOSITION: Home with home health  Discharge Instructions    Call MD for:  difficulty breathing, headache or visual disturbances   Complete by:  As directed    Call MD for:  extreme fatigue   Complete by:  As directed    Call MD for:  persistant dizziness or light-headedness   Complete by:  As directed    Call MD for:  persistant nausea and vomiting   Complete by:  As directed    Call MD for:  severe uncontrolled pain   Complete by:  As directed    Call MD for:  temperature >100.4   Complete by:  As directed    Discharge instructions   Complete by:  As directed    Please be sure to follow-up with your primary care provider within 1 week.  Take your medications as prescribed.  You were cared for by a hospitalist during your hospital stay. If you have any questions about your discharge medications or the care you received while you were in the hospital after you are discharged, you can call the unit and asked to speak with the hospitalist on call if the hospitalist that took care of you is not available. Once you are discharged, your primary care physician will handle any further medical issues. Please note that NO REFILLS for any discharge medications will be authorized once you are discharged, as it is imperative that you return to your primary care physician (or establish a relationship with a primary care physician if you do not have one) for your aftercare needs so that they can reassess your need for medications and monitor your lab values. If you do not have a primary care physician, you can call [phone] for a physician referral.   Increase activity slowly   Complete by:  As directed         Allergies as of 10/28/2018   No Known Allergies     Medication List    STOP taking these medications   Budesonide 90 MCG/ACT inhaler   doxycycline 100 MG capsule Commonly known as:  VIBRAMYCIN   gabapentin 600 MG  tablet Commonly known as:  NEURONTIN Replaced by:  gabapentin 100 MG capsule     TAKE these medications   acetaminophen 325 MG tablet Commonly known as:  TYLENOL Take 2 tablets (650 mg total) by mouth every 6 (six) hours as needed for mild pain (or Fever >/= 101).   albuterol 108 (90 Base) MCG/ACT inhaler Commonly known as:  PROVENTIL HFA;VENTOLIN HFA Inhale 2 puffs into the lungs every 6 (six) hours as needed for wheezing or shortness of breath.   famotidine 20 MG tablet Commonly known as:  PEPCID Take 1 tablet (20 mg total) by mouth 2 (two) times daily.   ferrous sulfate 325 (65 FE) MG tablet Take 1 tablet (325 mg total) by mouth 3 (three) times daily with meals.   FLOVENT HFA 44 MCG/ACT inhaler Generic drug:  fluticasone Inhale 2 puffs into the lungs 2 (two) times daily.   furosemide 20 MG tablet Commonly known as:  LASIX Take 1 tablet (20 mg total) by mouth daily. Start taking on:  10/29/2018   gabapentin 100 MG capsule Commonly known as:  NEURONTIN Take 1 capsule (100 mg total) by mouth 2 (two) times daily. Replaces:  gabapentin 600 MG tablet   hydrOXYzine 50 MG tablet Commonly known as:  ATARAX/VISTARIL Take 1 tablet (50 mg total)  by mouth 3 (three) times daily as needed for anxiety.   ipratropium-albuterol 0.5-2.5 (3) MG/3ML Soln Commonly known as:  DUONEB Take 3 mLs by nebulization 3 (three) times daily.   montelukast 10 MG tablet Commonly known as:  SINGULAIR Take 1 tablet (10 mg total) by mouth at bedtime.   nystatin 100000 UNIT/ML suspension Commonly known as:  MYCOSTATIN Use as directed 5 mLs (500,000 Units total) in the mouth or throat 4 (four) times daily.   polyethylene glycol packet Commonly known as:  MIRALAX / GLYCOLAX Take 17 g by mouth daily. Start taking on:  10/29/2018   potassium chloride SA 20 MEQ tablet Commonly known as:  K-DUR,KLOR-CON Take 2 tablets (40 mEq total) by mouth at bedtime.   predniSONE 10 MG tablet Commonly known as:   DELTASONE Take 4 tablets daily for for 4 days, then 3 tabs daily for 4 days, then 2 tabs daily for 4 days, then 1 tab daily for 4 days. What changed:    medication strength  how much to take  how to take this  when to take this  additional instructions   QUEtiapine 300 MG tablet Commonly known as:  SEROQUEL Take 1 tablet (300 mg total) by mouth at bedtime.   sertraline 100 MG tablet Commonly known as:  ZOLOFT Take 1 tablet (100 mg total) by mouth daily. What changed:  how much to take   traZODone 50 MG tablet Commonly known as:  DESYREL Take 1 tablet (50 mg total) by mouth at bedtime as needed for sleep.        Follow-up [facility] Follow up on 11/04/2018.   Why:  at 9 am; please try to keep your apt or call to reschedule; Zettie Pho NP Contact information: [place] [phone]          TOTAL DISCHARGE TIME: 58 mins  Bonnielee Haff  [facility] Pager [phone]  10/28/2018, 5:13 PM

## 2018-10-28 NOTE — Clinical Social Work Note (Addendum)
Discussed case with Surveyor, quantity of social work. He is agreeable to two week letter of guarantee for SNF either if patient is unable to admit to CIR and needs to go to SNF from hospital or if patient needs SNF after CIR admission. CIR admissions coordinator aware. Started PASARR screening. Currently under manual review.  Dayton Scrape, [place] [phone]  2:52 pm Patient is refusing CIR and SNF and will discharge home today.  CSW signing off.   Dayton Scrape, [place]

## 2018-10-28 NOTE — Progress Notes (Signed)
[facility] asked to fill 10 Match prescriptions today.  She was unable to pay the co-pays.  Prescriptions transferred to [facility] on [address].  Also, gave [facility] her Match information.  Thank you. Anette Guarneri, PharmD

## 2018-10-28 NOTE — Progress Notes (Signed)
Patient being discharged today from unit to home. Family to provided transportation. All discharge instructions reviewed with pt. Emphasis on maintaining medication regimen as directed, keeping follow up appts with all physicians.  All personal belongings with patient. No s/sx of distress noted. Pt alert and oriented presently.

## 2018-11-02 LAB — CULTURE, BLOOD (ROUTINE X 2)
CULTURE: NO GROWTH
Culture: NO GROWTH
SPECIAL REQUESTS: ADEQUATE
Special Requests: ADEQUATE

## 2018-11-04 ENCOUNTER — Other Ambulatory Visit: Payer: Self-pay

## 2018-11-04 ENCOUNTER — Ambulatory Visit: Payer: Self-pay | Attending: Family Medicine | Admitting: Physician Assistant

## 2018-11-04 VITALS — BP 123/87 | HR 83 | Temp 98.1°F | Resp 16 | Wt 166.0 lb

## 2018-11-04 DIAGNOSIS — J029 Acute pharyngitis, unspecified: Secondary | ICD-10-CM

## 2018-11-04 DIAGNOSIS — J202 Acute bronchitis due to streptococcus: Secondary | ICD-10-CM

## 2018-11-04 DIAGNOSIS — F319 Bipolar disorder, unspecified: Secondary | ICD-10-CM | POA: Insufficient documentation

## 2018-11-04 DIAGNOSIS — R197 Diarrhea, unspecified: Secondary | ICD-10-CM | POA: Insufficient documentation

## 2018-11-04 DIAGNOSIS — G9341 Metabolic encephalopathy: Secondary | ICD-10-CM | POA: Insufficient documentation

## 2018-11-04 DIAGNOSIS — Z79899 Other long term (current) drug therapy: Secondary | ICD-10-CM | POA: Insufficient documentation

## 2018-11-04 DIAGNOSIS — J209 Acute bronchitis, unspecified: Secondary | ICD-10-CM | POA: Insufficient documentation

## 2018-11-04 DIAGNOSIS — Z09 Encounter for follow-up examination after completed treatment for conditions other than malignant neoplasm: Secondary | ICD-10-CM

## 2018-11-04 DIAGNOSIS — R21 Rash and other nonspecific skin eruption: Secondary | ICD-10-CM | POA: Insufficient documentation

## 2018-11-04 DIAGNOSIS — Z8659 Personal history of other mental and behavioral disorders: Secondary | ICD-10-CM

## 2018-11-04 DIAGNOSIS — R05 Cough: Secondary | ICD-10-CM

## 2018-11-04 DIAGNOSIS — Z833 Family history of diabetes mellitus: Secondary | ICD-10-CM | POA: Insufficient documentation

## 2018-11-04 DIAGNOSIS — R109 Unspecified abdominal pain: Secondary | ICD-10-CM | POA: Insufficient documentation

## 2018-11-04 DIAGNOSIS — Z8249 Family history of ischemic heart disease and other diseases of the circulatory system: Secondary | ICD-10-CM | POA: Insufficient documentation

## 2018-11-04 MED ORDER — AMOXICILLIN 500 MG PO CAPS: 500.0000 mg | ORAL_CAPSULE | Freq: Two times a day (BID) | ORAL | 0 refills | Status: DC

## 2018-11-04 NOTE — Progress Notes (Signed)
Deborah Melendez  FBP:[id number]  OEU:[id number]  DOB - Aug 30, 1976  Chief Complaint  Patient presents with  . Hospitalization Follow-up    VDRF       Subjective:   Deborah Melendez is a 42 y.o. female here today for establishment of care.  She has a past medical history of smoking, asthma, bipolar disorder with suicidal ideation in the past, cocaine use, major depressive disorder and family history of coronary artery disease.  In glancing back it looks like she has had 8 emergency department visits in 2019.  On 10/03/2018 it was for shortness of breath and coughing.  She was noted to be tachycardic and afebrile and was sent home with a prescription for doxycycline.  On the very next day should be re-presented with chest pain and upper respiratory infection-like symptoms and an inhaled corticosteroid was added to her regimen as well as a steroid taper.  On 10/14/2018 she presented with increasing shortness of breath, audible wheezing, acute respiratory distress and encephalopathy.  She did not respond to oxygen nor BiPAP and led to intubation.  She actually self extubated herself on 10/17/2018 but was reintubated.  Her hospital course comp located by anemia status post at least 1 unit of blood, vascular congestion requiring Lasix, intermittent fevers and an abnormal urine drug screen.  She was sent home with steroids, nebulization treatments and antibiotics.  She states she has been compliant with her medications.  She has had continued sore throat for at least 2 days.  She feels like there is fluid in her ears.  Her stomach hurts and she is been having some diarrhea.  Occasionally she is coughing up some phlegm.  No chest pain.  No swelling in her legs.  She has developed a rash on her face and believes that 1 of the medications is causing trouble. Diet ok.   ROS: GEN: denies fever or chills, denies change in weight Skin: denies lesions or rashes HEENT: denies headache, +earache,  epistaxis, +sore throat, or neck pain LUNGS: denies SHOB, dyspnea, PND, orthopnea CV: denies CP or palpitations ABD: denies abd pain, N or V EXT: denies muscle spasms or swelling; no pain in lower ext, no weakness NEURO: denies numbness or tingling, denies sz, stroke or TIA  ALLERGIES: No Known Allergies  PAST MEDICAL HISTORY: Past Medical History:  Diagnosis Date  . Bipolar 1 disorder, depressed, severe ([place]) 02/04/2017  . Cocaine abuse with cocaine-induced mood disorder ([place]) 07/27/2017  . Depression   . Homicidal ideations   . Manic behavior ([place])   . MDD (major depressive disorder), recurrent severe, without psychosis ([place]) 08/08/2015  . Substance or medication-induced bipolar and related disorder with onset during intoxication ([place]) 09/08/2016  . Suicidal ideation     PAST SURGICAL HISTORY: Past Surgical History:  Procedure Laterality Date  . FINGER SURGERY      MEDICATIONS AT HOME: Prior to Admission medications   Medication Sig Start Date End Date Taking? Authorizing Provider  acetaminophen (TYLENOL) 325 MG tablet Take 2 tablets (650 mg total) by mouth every 6 (six) hours as needed for mild pain (or Fever >/= 101). 10/06/18  Yes Dorrell, Andree Elk, MD  albuterol (PROVENTIL HFA;VENTOLIN HFA) 108 (90 Base) MCG/ACT inhaler Inhale 2 puffs into the lungs every 6 (six) hours as needed for wheezing or shortness of breath. 10/06/18  Yes Dorrell, Andree Elk, MD  famotidine (PEPCID) 20 MG tablet Take 1 tablet (20 mg total) by mouth 2 (two) times daily. 10/28/18  Yes Bonnielee Haff, MD  FLOVENT HFA 44 MCG/ACT inhaler Inhale 2 puffs into the lungs 2 (two) times daily. 10/28/18  Yes Bonnielee Haff, MD  furosemide (LASIX) 20 MG tablet Take 1 tablet (20 mg total) by mouth daily. 10/29/18  Yes Bonnielee Haff, MD  gabapentin (NEURONTIN) 100 MG capsule Take 1 capsule (100 mg total) by mouth 2 (two) times daily. 10/28/18  Yes Bonnielee Haff, MD  hydrOXYzine (ATARAX/VISTARIL) 50 MG tablet Take 1  tablet (50 mg total) by mouth 3 (three) times daily as needed for anxiety. 08/29/18  Yes McNew, Tyson Babinski, MD  ipratropium-albuterol (DUONEB) 0.5-2.5 (3) MG/3ML SOLN Take 3 mLs by nebulization 3 (three) times daily. 10/28/18  Yes Bonnielee Haff, MD  potassium chloride SA (K-DUR,KLOR-CON) 20 MEQ tablet Take 2 tablets (40 mEq total) by mouth at bedtime. 10/28/18  Yes Bonnielee Haff, MD  predniSONE (DELTASONE) 10 MG tablet Take 4 tablets daily for for 4 days, then 3 tabs daily for 4 days, then 2 tabs daily for 4 days, then 1 tab daily for 4 days. 10/28/18  Yes Bonnielee Haff, MD  amoxicillin (AMOXIL) 500 MG capsule Take 1 capsule (500 mg total) by mouth 2 (two) times daily. 11/04/18   Brayton Caves, PA-C  ferrous sulfate 325 (65 FE) MG tablet Take 1 tablet (325 mg total) by mouth 3 (three) times daily with meals. 08/29/18 10/04/18  McNew, Tyson Babinski, MD  QUEtiapine (SEROQUEL) 300 MG tablet Take 1 tablet (300 mg total) by mouth at bedtime. Patient not taking: Reported on 11/04/2018 08/29/18   Marylin Crosby, MD  sertraline (ZOLOFT) 100 MG tablet Take 1 tablet (100 mg total) by mouth daily. Patient not taking: Reported on 11/04/2018 08/30/18   Marylin Crosby, MD  traZODone (DESYREL) 50 MG tablet Take 1 tablet (50 mg total) by mouth at bedtime as needed for sleep. Patient not taking: Reported on 11/04/2018 08/29/18   Marylin Crosby, MD    Family History  Problem Relation Age of Onset  . Diabetes Mother   . Hypertension Mother   . Drug abuse Father   . Schizophrenia Maternal Aunt    Social-unmarried, 1 child, smokes, does not work  Objective:   Vitals:   11/04/18 0832  BP: 123/87  Pulse: 83  Resp: 16  Temp: 98.1 F (36.7 C)  TempSrc: Oral  SpO2: 100%  Weight: 166 lb (75.3 kg)    Exam General appearance : Awake, alert, not in any distress. Speech Clear. Not toxic looking HEENT: Head normocephalic.  Pupils equal and reactive.  Ears bilaterally with cerumen and slight evidence of fluid bilaterally.   Throat is injected but no evidence of exudate. Neck: supple, no JVD. No cervical lymphadenopathy.  Chest:Good air entry bilaterally, no added sounds. No wheezing.  CVS: S1 S2 regular, no murmurs.  Abdomen: Bowel sounds present, Non tender and not distended with no guarding, rigidity or rebound. Extremities: B/L Lower Ext shows no edema, both legs are warm to touch Neurology: Awake alert, and oriented X 3, CN II-XII intact, Non focal Skin: Maculopapular rash over entire face, neck and upper chest Wounds:N/A  Data Review Lab Results  Component Value Date   HGBA1C 5.3 08/25/2018   HGBA1C 5.6 10/09/2017   HGBA1C 5.4 01/21/2017     Assessment & Plan  1. Status asthmaticus, resolved  -cont ICS, nebs, albuterol  -discontinued Singulair 2/2 # 3 2. Acute bronchitis with group B streptococcus  -pt requesting another ABx>Amoxil for 10 days 3. Rash, likely allergic rxn  -DC Singulair  -OTC  hydrocortisone Cream 4. Acute metabolic encephalopathy, improved 5. History of bipolar disorder and depression  -PHQ-9 given to SW  -she declines meds 6. History of polysubstance abuse  -not ready to quit 7. Abd pain/diarrhea  -stop miralax   Recheck labs, CBC, BMP today Financial counselor appt Return in about 4 weeks (around 12/02/2018).  The patient was given clear instructions to go to ER or return to medical center if symptoms don't improve, worsen or new problems develop. The patient verbalized understanding. The patient was told to call to get lab results if they haven't heard anything in the next week.   Total time spent with patient was 49 min. Greater than 50 % of this visit was spent face to face counseling and coordinating care regarding risk factor modification, compliance importance and encouragement, education related to hospitalization review, laboratory data, medications and future expectations..  This note has been created with Automotive engineer. Any transcriptional errors are unintentional.    Zettie Pho, PA-C [facility], [place]   11/04/2018, 10:06 AM

## 2018-11-04 NOTE — Patient Instructions (Signed)
Stop taking Singulair. This may be causing the rash. We will get you an appointment with the lung doctor to discuss. Stop smoking.

## 2018-11-04 NOTE — Progress Notes (Signed)
Hospital f/u: "just don't feel good" Discharged on Friday Pain in throat and stomach Face breaking out from medication

## 2018-11-05 LAB — CBC WITH DIFFERENTIAL/PLATELET
BASOS: 1 %
Basophils Absolute: 0.1 10*3/uL (ref 0.0–0.2)
EOS (ABSOLUTE): 0.6 10*3/uL — ABNORMAL HIGH (ref 0.0–0.4)
Eos: 5 %
HEMATOCRIT: 32 % — AB (ref 34.0–46.6)
HEMOGLOBIN: 10.3 g/dL — AB (ref 11.1–15.9)
IMMATURE GRANS (ABS): 0 10*3/uL (ref 0.0–0.1)
IMMATURE GRANULOCYTES: 0 %
Lymphocytes Absolute: 2.3 10*3/uL (ref 0.7–3.1)
Lymphs: 19 %
MCH: 27.2 pg (ref 26.6–33.0)
MCHC: 32.2 g/dL (ref 31.5–35.7)
MCV: 84 fL (ref 79–97)
MONOCYTES: 4 %
Monocytes Absolute: 0.5 10*3/uL (ref 0.1–0.9)
NEUTROS PCT: 71 %
Neutrophils Absolute: 8.5 10*3/uL — ABNORMAL HIGH (ref 1.4–7.0)
Platelets: 255 10*3/uL (ref 150–450)
RBC: 3.79 x10E6/uL (ref 3.77–5.28)
RDW: 18.6 % — ABNORMAL HIGH (ref 12.3–15.4)
WBC: 11.9 10*3/uL — ABNORMAL HIGH (ref 3.4–10.8)

## 2018-11-05 LAB — BASIC METABOLIC PANEL
BUN / CREAT RATIO: 19 (ref 9–23)
BUN: 12 mg/dL (ref 6–24)
CO2: 22 mmol/L (ref 20–29)
CREATININE: 0.63 mg/dL (ref 0.57–1.00)
Calcium: 8.9 mg/dL (ref 8.7–10.2)
Chloride: 105 mmol/L (ref 96–106)
GFR calc Af Amer: 129 mL/min/{1.73_m2} (ref >59)
GFR, EST NON AFRICAN AMERICAN: 112 mL/min/{1.73_m2} (ref >59)
Glucose: 75 mg/dL (ref 65–99)
POTASSIUM: 3.5 mmol/L (ref 3.5–5.2)
Sodium: 142 mmol/L (ref 134–144)

## 2018-11-13 ENCOUNTER — Emergency Department (HOSPITAL_COMMUNITY)
Admission: EM | Admit: 2018-11-13 | Discharge: 2018-11-13 | Disposition: A | Discharge status: Home / Self Care | Payer: Self-pay | Attending: Emergency Medicine | Admitting: Emergency Medicine

## 2018-11-13 ENCOUNTER — Emergency Department (HOSPITAL_COMMUNITY): Payer: Self-pay

## 2018-11-13 ENCOUNTER — Other Ambulatory Visit: Payer: Self-pay

## 2018-11-13 ENCOUNTER — Encounter (HOSPITAL_COMMUNITY): Payer: Self-pay | Admitting: Emergency Medicine

## 2018-11-13 DIAGNOSIS — J449 Chronic obstructive pulmonary disease, unspecified: Secondary | ICD-10-CM | POA: Insufficient documentation

## 2018-11-13 DIAGNOSIS — F17210 Nicotine dependence, cigarettes, uncomplicated: Secondary | ICD-10-CM | POA: Insufficient documentation

## 2018-11-13 DIAGNOSIS — R490 Dysphonia: Secondary | ICD-10-CM | POA: Insufficient documentation

## 2018-11-13 DIAGNOSIS — Z79899 Other long term (current) drug therapy: Secondary | ICD-10-CM | POA: Insufficient documentation

## 2018-11-13 LAB — CBC
HEMATOCRIT: 34.3 % — AB (ref 36.0–46.0)
HEMOGLOBIN: 10.6 g/dL — AB (ref 12.0–15.0)
MCH: 28 pg (ref 26.0–34.0)
MCHC: 30.9 g/dL (ref 30.0–36.0)
MCV: 90.5 fL (ref 80.0–100.0)
Platelets: 370 10*3/uL (ref 150–400)
RBC: 3.79 MIL/uL — AB (ref 3.87–5.11)
RDW: 21.1 % — ABNORMAL HIGH (ref 11.5–15.5)
WBC: 9.5 10*3/uL (ref 4.0–10.5)
nRBC: 0 % (ref 0.0–0.2)

## 2018-11-13 LAB — BASIC METABOLIC PANEL
Anion gap: 8 (ref 5–15)
BUN: 14 mg/dL (ref 6–20)
CHLORIDE: 107 mmol/L (ref 98–111)
CO2: 22 mmol/L (ref 22–32)
Calcium: 9 mg/dL (ref 8.9–10.3)
Creatinine, Ser: 0.78 mg/dL (ref 0.44–1.00)
GFR calc non Af Amer: >60 mL/min (ref >60)
Glucose, Bld: 102 mg/dL — ABNORMAL HIGH (ref 70–99)
POTASSIUM: 4.3 mmol/L (ref 3.5–5.1)
Sodium: 137 mmol/L (ref 135–145)

## 2018-11-13 LAB — GROUP A STREP BY PCR: Group A Strep by PCR: NOT DETECTED

## 2018-11-13 NOTE — ED Provider Notes (Signed)
[facility] EMERGENCY DEPARTMENT Provider Note   CSN: [id number] Arrival date & time: 11/13/18  3151     History   Chief Complaint Chief Complaint  Patient presents with  . Shortness of Breath    HPI Deborah Melendez is a 42 y.o. female.  Patient presents to the emergency department with a chief complaint of hoarseness x2 weeks.  She states she has been hoarse since being discharged from the hospital after having been intubated for COPD.  Denies any fever or chills.  She reports some pain with swallowing.  Reports also having some cough.  She has been taking prednisone daily.  The history is provided by the patient. No language interpreter was used.    Past Medical History:  Diagnosis Date  . Bipolar 1 disorder, depressed, severe ([place]) 02/04/2017  . Cocaine abuse with cocaine-induced mood disorder ([place]) 07/27/2017  . Depression   . Homicidal ideations   . Manic behavior ([place])   . MDD (major depressive disorder), recurrent severe, without psychosis ([place]) 08/08/2015  . Substance or medication-induced bipolar and related disorder with onset during intoxication ([place]) 09/08/2016  . Suicidal ideation     Patient Active Problem List   Diagnosis Date Noted  . Altered mental status   . Acute on chronic respiratory failure with hypoxia ([place]) 10/14/2018  . Bronchitis 10/05/2018  . Acute bronchitis with asthma with acute exacerbation 10/04/2018  . Major depressive disorder, recurrent episode with mixed features ([place]) 01/27/2018  . Tobacco use disorder 05/22/2017  . Suicidal ideation   . Cannabis use disorder, severe, dependence ([place]) 01/20/2017  . Cocaine use disorder, severe, dependence ([place]) 08/08/2015    Past Surgical History:  Procedure Laterality Date  . FINGER SURGERY       OB History    Gravida  6   Para  4   Term      Preterm      AB      Living  4     SAB      TAB      Ectopic      Multiple      Live Births  0              Home Medications    Prior to Admission medications   Medication Sig Start Date End Date Taking? Authorizing Provider  albuterol (PROVENTIL HFA;VENTOLIN HFA) 108 (90 Base) MCG/ACT inhaler Inhale 2 puffs into the lungs every 6 (six) hours as needed for wheezing or shortness of breath. 10/06/18  Yes Dorrell, Andree Elk, MD  FLOVENT HFA 44 MCG/ACT inhaler Inhale 2 puffs into the lungs 2 (two) times daily. 10/28/18  Yes Bonnielee Haff, MD  potassium chloride SA (K-DUR,KLOR-CON) 20 MEQ tablet Take 2 tablets (40 mEq total) by mouth at bedtime. 10/28/18  Yes Bonnielee Haff, MD  acetaminophen (TYLENOL) 325 MG tablet Take 2 tablets (650 mg total) by mouth every 6 (six) hours as needed for mild pain (or Fever >/= 101). Patient not taking: Reported on 11/13/2018 10/06/18   Dorrell, Andree Elk, MD  amoxicillin (AMOXIL) 500 MG capsule Take 1 capsule (500 mg total) by mouth 2 (two) times daily. Patient not taking: Reported on 11/13/2018 11/04/18   Brayton Caves, PA-C  famotidine (PEPCID) 20 MG tablet Take 1 tablet (20 mg total) by mouth 2 (two) times daily. Patient not taking: Reported on 11/13/2018 10/28/18   Bonnielee Haff, MD  ferrous sulfate 325 (65 FE) MG tablet Take 1 tablet (325 mg total)  by mouth 3 (three) times daily with meals. Patient not taking: Reported on 11/13/2018 08/29/18 11/13/26  Marylin Crosby, MD  furosemide (LASIX) 20 MG tablet Take 1 tablet (20 mg total) by mouth daily. Patient not taking: Reported on 11/13/2018 10/29/18   Bonnielee Haff, MD  gabapentin (NEURONTIN) 100 MG capsule Take 1 capsule (100 mg total) by mouth 2 (two) times daily. Patient not taking: Reported on 11/13/2018 10/28/18   Bonnielee Haff, MD  hydrOXYzine (ATARAX/VISTARIL) 50 MG tablet Take 1 tablet (50 mg total) by mouth 3 (three) times daily as needed for anxiety. Patient not taking: Reported on 11/13/2018 08/29/18   Marylin Crosby, MD  ipratropium-albuterol (DUONEB) 0.5-2.5 (3) MG/3ML SOLN Take 3 mLs by  nebulization 3 (three) times daily. Patient not taking: Reported on 11/13/2018 10/28/18   Bonnielee Haff, MD  predniSONE (DELTASONE) 10 MG tablet Take 4 tablets daily for for 4 days, then 3 tabs daily for 4 days, then 2 tabs daily for 4 days, then 1 tab daily for 4 days. Patient not taking: Reported on 11/13/2018 10/28/18   Bonnielee Haff, MD  QUEtiapine (SEROQUEL) 300 MG tablet Take 1 tablet (300 mg total) by mouth at bedtime. Patient not taking: Reported on 11/04/2018 08/29/18   Marylin Crosby, MD  sertraline (ZOLOFT) 100 MG tablet Take 1 tablet (100 mg total) by mouth daily. Patient not taking: Reported on 11/04/2018 08/30/18   Marylin Crosby, MD  traZODone (DESYREL) 50 MG tablet Take 1 tablet (50 mg total) by mouth at bedtime as needed for sleep. Patient not taking: Reported on 11/04/2018 08/29/18   Marylin Crosby, MD    Family History Family History  Problem Relation Age of Onset  . Diabetes Mother   . Hypertension Mother   . Drug abuse Father   . Schizophrenia Maternal Aunt     Social History Social History   Tobacco Use  . Smoking status: Current Every Day Smoker    Packs/day: 1.00    Types: Cigarettes  . Smokeless tobacco: Never Used  . Tobacco comment: pt reported quiting three weeks ago  Substance Use Topics  . Alcohol use: Yes    Alcohol/week: 10.0 standard drinks    Types: 10 Shots of liquor per week    Comment: once a week drinks a bottle of vodka  . Drug use: Yes    Frequency: 1.0 times per week    Types: Cocaine, Marijuana    Comment: last use 10/11/2018     Allergies   Patient has no known allergies.   Review of Systems Review of Systems  All other systems reviewed and are negative.    Physical Exam Updated Vital Signs BP 115/86   Pulse 90   Temp 98.1 F (36.7 C) (Oral)   Resp 19   SpO2 99%   Physical Exam  Constitutional: She is oriented to person, place, and time. She appears well-developed and well-nourished.  HENT:  Head: Normocephalic and  atraumatic.  No stridor Airway intact   Eyes: Pupils are equal, round, and reactive to light. Conjunctivae and EOM are normal.  Neck: Normal range of motion. Neck supple.  Cardiovascular: Normal rate and regular rhythm. Exam reveals no gallop and no friction rub.  No murmur heard. Pulmonary/Chest: Effort normal and breath sounds normal. No respiratory distress. She has no wheezes. She has no rales. She exhibits no tenderness.  CTAB  Abdominal: Soft. Bowel sounds are normal. She exhibits no distension and no mass. There is no tenderness. There is  no rebound and no guarding.  Musculoskeletal: Normal range of motion. She exhibits no edema or tenderness.  Neurological: She is alert and oriented to person, place, and time.  Skin: Skin is warm and dry.  Psychiatric: She has a normal mood and affect. Her behavior is normal. Judgment and thought content normal.  Nursing note and vitals reviewed.    ED Treatments / Results  Labs (all labs ordered are listed, but only abnormal results are displayed) Labs Reviewed  CBC - Abnormal; Notable for the following components:      Result Value   RBC 3.79 (*)    Hemoglobin 10.6 (*)    HCT 34.3 (*)    RDW 21.1 (*)    All other components within normal limits  BASIC METABOLIC PANEL - Abnormal; Notable for the following components:   Glucose, Bld 102 (*)    All other components within normal limits  GROUP A STREP BY PCR    EKG None  Radiology Dg Neck Soft Tissue  Result Date: 11/13/2018 CLINICAL DATA:  Hoarseness and difficulty swallowing. EXAM: NECK SOFT TISSUES - 1+ VIEW COMPARISON:  None. FINDINGS: There is no evidence of retropharyngeal soft tissue swelling or epiglottic enlargement. The cervical airway is unremarkable and no radio-opaque foreign body identified. IMPRESSION: Negative. Electronically Signed   By: Ulyses Jarred M.D.   On: 11/13/2018 05:08    Procedures Procedures (including critical care time)  Medications Ordered in  ED Medications - No data to display   Initial Impression / Assessment and Plan / ED Course  I have reviewed the triage vital signs and the nursing notes.  Pertinent labs & imaging results that were available during my care of the patient were reviewed by me and considered in my medical decision making (see chart for details).     Patient with hoarseness following intubation.  No fever.  Lungs are clear.  Plain films of neck are negative.    No stridor.  O2 sat 99%.  On prednisone at home.  Recommend pulmonology follow-up.  Final Clinical Impressions(s) / ED Diagnoses   Final diagnoses:  Hoarseness of voice    ED Discharge Orders    None       Montine Circle, PA-C 11/13/18 0615    Mesner, Corene Cornea, MD 11/13/18 [phone]

## 2018-11-13 NOTE — ED Triage Notes (Signed)
Pt was intubated for COPD exacerbation on 10/18 and spent 2 weeks in the hospital. Pt endorses sore throat, increased hoarseness, increase in cough, feels inability to swallow. Throat pain and shortness of breath. Cough present.

## 2018-11-15 ENCOUNTER — Ambulatory Visit: Payer: Self-pay | Admitting: Critical Care Medicine

## 2018-11-15 NOTE — Progress Notes (Deleted)
Subjective:    Patient ID: Deborah Melendez, female    DOB: 21-Jan-1976, 42 y.o.   MRN: [id number]  42 year old female admitted on 10/18 w/ working dx of asthmatic exacerbation w/ worsening hypoxia and encephalopathy. Intubated, required paralytics for ventilator synchrony. UDS positive for THC, cocaine & benzos.   Adm 10/18  D/c 11/1  Significant Hospital Events  10/18 Admitted, intubated 10/21 Self extubated x 2 on same day, re-intubated. ABG worse. Deeply sedated and paralyzed.  10/22 Hemoglobin low, Transfuse 1 unit. Nimbex stopped.  10/23 Got vent dysnch on sedation wua. HR 130 and wheezing. Agitated on WUA. Hypetensive 10/24 Wheezing improved after lasix. Tolerating TF. Mother indicates she smokes "a lot" 10/25 ETT low. Wheezy despite ET tube and diureses. EF 55% 10/26 Fent gtt/diprivan gtt ->agitation barrier to wean 10/28>> Extubated  Procedures (surgical and bedside): ETT 10/18 >>10/28  Significant Diagnostic Tests: CTA Chest 10/18 >>no pulmonary embolus, right middle lobe collapse, bronchial wall thickening, bronchitis, mild basal atelectasis.  UDS 10/18 >> cocaine, benzos, marojiuana  Micro Data: HIV 10/19 >> neg Sputum 10/20 >> group B strep Respiratory virus panel 10/8 >> negative   HOSPITAL COURSE:   Acute respiratory failure with hypoxia/status asthmaticus Patient was in the intensive care unit. She was intubated. Successfully extubated on 10/28. Chest x-ray was repeated on 10/30 and did not show any acute findings. She is stable from a respiratory standpoint.   She has improved with treatment including nebulizer treatments, nebulized budesonide and systemic steroids.  She was also placed on Singulair.  She was also given IV diuretics due to fluid overload.  She is saturating normal on room air.  Steroids will be tapered down gradually. Seen by speech therapy.  Initially was on a dysphagia diet.  However as her mentation and respiratory  status has improved her ability to swallow has also improved.  Cleared for regular diet after she underwent a modified barium swallow.  Acute bronchitis with group B strep Had a fever of 102.8 on 10/28. Repeat blood cultures negative. WBC was elevated at 21.6 but improved subsequently. No further episodes of fever. She completed a course of antibiotics on 10/27. Currently off of antibiotics.   Acute metabolic versus toxic encephalopathy Mental status has improved and possibly close to baseline now.    History of bipolar disorder and depression Stable.  Continue home medications  History of polysubstance abuse Stable.  Counseled  Anemia of chronic disease Stable. No evidence of overt bleeding.  Hyperglycemia Most likely secondary to steroids. Improved now.  Hypokalemia She will be given a potassium supplement on a daily basis while she is on diuretics.  Patient was seen by physical therapy.  Inpatient rehab versus skilled nursing facility was recommended.  Patient declines both and wishes to go home.  She does not have any insurance which creates challenge.  Case manager to try and arrange home health for this patient.  Pt then to ED for hoarseness 11/17.  Here for ED f/u and post hosp f/u:       Review of Systems     Objective:   Physical Exam There were no vitals filed for this visit.  Gen: Pleasant, well-nourished, in no distress,  normal affect  ENT: No lesions,  mouth clear,  oropharynx clear, no postnasal drip  Neck: No JVD, no TMG, no carotid bruits  Lungs: No use of accessory muscles, no dullness to percussion, clear without rales or rhonchi  Cardiovascular: RRR, heart sounds normal, no murmur or  gallops, no peripheral edema  Abdomen: soft and NT, no HSM,  BS normal  Musculoskeletal: No deformities, no cyanosis or clubbing  Neuro: alert, non focal  Skin: Warm, no lesions or rashes  No results found.        Assessment & Plan:

## 2018-11-29 ENCOUNTER — Telehealth: Payer: Self-pay | Admitting: General Practice

## 2018-11-29 NOTE — Telephone Encounter (Signed)
Would be for COPD Home environment, Medication management,   Diet education, and  pain control. Because she does not have insurance [facility] is seeing her as a charity patient.   [facility] RN that patient has not established with PCP here, would not know if the order could be approved. Has appointment with [facility] provider on 12/08/2018.  Please advise.

## 2018-11-29 NOTE — Telephone Encounter (Signed)
Okay to give verbal order.  Once she has been seen by a clinician from [facility], we can give verbal orders.

## 2018-11-29 NOTE — Telephone Encounter (Signed)
Nurse Wells Guiles from [facility] called to request skilled nursing verbal orders for 5 visits Please follow up [phone]

## 2018-11-30 NOTE — Telephone Encounter (Signed)
Left message on voicemail for Wells Guiles

## 2018-12-07 NOTE — Progress Notes (Deleted)
Patient ID: Deborah Melendez, female   DOB: Jan 11, 1976, 42 y.o.   MRN: [id number]  After ED visit 11/13/2018.  From ED note: Patient with hoarseness following intubation.  No fever.  Lungs are clear.  Plain films of neck are negative.    No stridor.  O2 sat 99%.  On prednisone at home.  Recommend pulmonology follow-up.

## 2018-12-08 ENCOUNTER — Inpatient Hospital Stay: Payer: Self-pay

## 2018-12-08 ENCOUNTER — Encounter (HOSPITAL_COMMUNITY): Payer: Self-pay

## 2018-12-08 ENCOUNTER — Emergency Department (HOSPITAL_COMMUNITY)
Admission: EM | Admit: 2018-12-08 | Discharge: 2018-12-08 | Disposition: A | Discharge status: Home / Self Care | Payer: Self-pay | Attending: Emergency Medicine | Admitting: Emergency Medicine

## 2018-12-08 ENCOUNTER — Emergency Department (HOSPITAL_COMMUNITY): Payer: Self-pay

## 2018-12-08 ENCOUNTER — Other Ambulatory Visit: Payer: Self-pay

## 2018-12-08 DIAGNOSIS — F17210 Nicotine dependence, cigarettes, uncomplicated: Secondary | ICD-10-CM | POA: Insufficient documentation

## 2018-12-08 DIAGNOSIS — J069 Acute upper respiratory infection, unspecified: Secondary | ICD-10-CM | POA: Insufficient documentation

## 2018-12-08 DIAGNOSIS — J449 Chronic obstructive pulmonary disease, unspecified: Secondary | ICD-10-CM | POA: Insufficient documentation

## 2018-12-08 DIAGNOSIS — B9789 Other viral agents as the cause of diseases classified elsewhere: Secondary | ICD-10-CM | POA: Insufficient documentation

## 2018-12-08 HISTORY — DX: Chronic obstructive pulmonary disease, unspecified: J44.9

## 2018-12-08 HISTORY — DX: Unspecified asthma, uncomplicated: J45.909

## 2018-12-08 LAB — INFLUENZA PANEL BY PCR (TYPE A & B)
INFLBPCR: NEGATIVE
Influenza A By PCR: NEGATIVE

## 2018-12-08 NOTE — Discharge Instructions (Addendum)
Use your nebulizer and inhaler as needed as prescribed. Follow up with your PCP as soon as possible, return to ER for worsening or concerning symptoms.

## 2018-12-08 NOTE — ED Triage Notes (Signed)
Pt presents with SOB for 3 days, reports unable to walk any distance without getting "out of breath."  Pt has recent hx of intubation secondary to asthma, COPD as reported.  Pt also c/o sore throat for 2 days, cough with thick clear mucous, runny nose.  Pt denies fever.  Pt currently continues to smoke cigarettes.

## 2018-12-08 NOTE — ED Provider Notes (Signed)
[facility] EMERGENCY DEPARTMENT Provider Note   CSN: [id number] Arrival date & time: 12/08/18  9741     History   Chief Complaint Chief Complaint  Patient presents with  . Shortness of Breath    HPI Deborah Melendez is a 42 y.o. female.  42 year old female presents with complaint of cough, congestion, body aches, sore throat, wheezing and shortness of breath.  Patient has a history of COPD, she was admitted to the ICU and intubated 2 months ago.  Patient states she continues to have a hoarse voice since that time.  Patient has been using her inhalers as prescribed, requests nebulizer to use the medication she was discharged home with for nebulizer.  Patient denies fevers, chills, sick contacts.  Cough is nonproductive.  No other complaints or concerns.     Past Medical History:  Diagnosis Date  . Asthma   . Bipolar 1 disorder, depressed, severe ([place]) 02/04/2017  . Cocaine abuse with cocaine-induced mood disorder ([place]) 07/27/2017  . COPD (chronic obstructive pulmonary disease) ([place])   . Depression   . Homicidal ideations   . Manic behavior ([place])   . MDD (major depressive disorder), recurrent severe, without psychosis ([place]) 08/08/2015  . Substance or medication-induced bipolar and related disorder with onset during intoxication ([place]) 09/08/2016  . Suicidal ideation     Patient Active Problem List   Diagnosis Date Noted  . Altered mental status   . Acute on chronic respiratory failure with hypoxia ([place]) 10/14/2018  . Bronchitis 10/05/2018  . Acute bronchitis with asthma with acute exacerbation 10/04/2018  . Major depressive disorder, recurrent episode with mixed features ([place]) 01/27/2018  . Tobacco use disorder 05/22/2017  . Suicidal ideation   . Cannabis use disorder, severe, dependence ([place]) 01/20/2017  . Cocaine use disorder, severe, dependence ([place]) 08/08/2015    Past Surgical History:  Procedure Laterality Date  . FINGER SURGERY        OB History    Gravida  6   Para  4   Term      Preterm      AB      Living  4     SAB      TAB      Ectopic      Multiple      Live Births  0            Home Medications    Prior to Admission medications   Medication Sig Start Date End Date Taking? Authorizing Provider  acetaminophen (TYLENOL) 325 MG tablet Take 2 tablets (650 mg total) by mouth every 6 (six) hours as needed for mild pain (or Fever >/= 101). Patient not taking: Reported on 11/13/2018 10/06/18   Dorrell, Andree Elk, MD  albuterol (PROVENTIL HFA;VENTOLIN HFA) 108 (90 Base) MCG/ACT inhaler Inhale 2 puffs into the lungs every 6 (six) hours as needed for wheezing or shortness of breath. Patient not taking: Reported on 12/08/2018 10/06/18   Dorrell, Andree Elk, MD  famotidine (PEPCID) 20 MG tablet Take 1 tablet (20 mg total) by mouth 2 (two) times daily. Patient not taking: Reported on 11/13/2018 10/28/18   Bonnielee Haff, MD  ferrous sulfate 325 (65 FE) MG tablet Take 1 tablet (325 mg total) by mouth 3 (three) times daily with meals. Patient not taking: Reported on 11/13/2018 08/29/18 11/13/26  Marylin Crosby, MD  FLOVENT HFA 44 MCG/ACT inhaler Inhale 2 puffs into the lungs 2 (two) times daily. Patient not taking: Reported on 12/08/2018  10/28/18   Bonnielee Haff, MD  furosemide (LASIX) 20 MG tablet Take 1 tablet (20 mg total) by mouth daily. Patient not taking: Reported on 11/13/2018 10/29/18   Bonnielee Haff, MD  gabapentin (NEURONTIN) 100 MG capsule Take 1 capsule (100 mg total) by mouth 2 (two) times daily. Patient not taking: Reported on 11/13/2018 10/28/18   Bonnielee Haff, MD  hydrOXYzine (ATARAX/VISTARIL) 50 MG tablet Take 1 tablet (50 mg total) by mouth 3 (three) times daily as needed for anxiety. Patient not taking: Reported on 11/13/2018 08/29/18   Marylin Crosby, MD  ipratropium-albuterol (DUONEB) 0.5-2.5 (3) MG/3ML SOLN Take 3 mLs by nebulization 3 (three) times daily. Patient not taking:  Reported on 11/13/2018 10/28/18   Bonnielee Haff, MD  potassium chloride SA (K-DUR,KLOR-CON) 20 MEQ tablet Take 2 tablets (40 mEq total) by mouth at bedtime. Patient not taking: Reported on 12/08/2018 10/28/18   Bonnielee Haff, MD  QUEtiapine (SEROQUEL) 300 MG tablet Take 1 tablet (300 mg total) by mouth at bedtime. Patient not taking: Reported on 11/04/2018 08/29/18   Marylin Crosby, MD  sertraline (ZOLOFT) 100 MG tablet Take 1 tablet (100 mg total) by mouth daily. Patient not taking: Reported on 11/04/2018 08/30/18   Marylin Crosby, MD  traZODone (DESYREL) 50 MG tablet Take 1 tablet (50 mg total) by mouth at bedtime as needed for sleep. Patient not taking: Reported on 11/04/2018 08/29/18   Marylin Crosby, MD    Family History Family History  Problem Relation Age of Onset  . Diabetes Mother   . Hypertension Mother   . Drug abuse Father   . Schizophrenia Maternal Aunt     Social History Social History   Tobacco Use  . Smoking status: Current Every Day Smoker    Packs/day: 1.00    Types: Cigarettes  . Smokeless tobacco: Never Used  . Tobacco comment: pt reported quiting three weeks ago  Substance Use Topics  . Alcohol use: Yes    Comment: pt denies drinking at this time  . Drug use: Yes    Frequency: 1.0 times per week    Types: Cocaine, Marijuana    Comment: last use 12/06/18     Allergies   Patient has no known allergies.   Review of Systems Review of Systems  Constitutional: Negative for chills and fever.  HENT: Positive for congestion, postnasal drip, sore throat and voice change. Negative for ear pain, rhinorrhea, sinus pressure, sinus pain, sneezing and trouble swallowing.   Eyes: Negative for redness.  Respiratory: Positive for cough, shortness of breath and wheezing.   Cardiovascular: Negative for chest pain, palpitations and leg swelling.  Gastrointestinal: Negative for abdominal pain.  Musculoskeletal: Positive for arthralgias and myalgias.  Skin: Negative for rash  and wound.  Allergic/Immunologic: Negative for immunocompromised state.  Neurological: Negative for weakness and headaches.  Hematological: Negative for adenopathy.  Psychiatric/Behavioral: Negative for confusion.  All other systems reviewed and are negative.    Physical Exam Updated Vital Signs BP (!) 127/95 (BP Location: Right Arm)   Pulse 92   Temp 97.6 F (36.4 C) (Oral)   Resp 18   Ht 5\' 4"  (1.626 m)   Wt 74.8 kg   LMP 12/07/2018 (Exact Date)   SpO2 100%   BMI 28.32 kg/m   Physical Exam Vitals signs and nursing note reviewed.  Constitutional:      General: She is not in acute distress.    Appearance: She is well-developed. She is not diaphoretic.  HENT:  Head: Normocephalic and atraumatic.     Nose: Nose normal.     Mouth/Throat:     Pharynx: Posterior oropharyngeal erythema present. No pharyngeal swelling, oropharyngeal exudate or uvula swelling.     Tonsils: No tonsillar exudate or tonsillar abscesses.  Cardiovascular:     Rate and Rhythm: Normal rate and regular rhythm.  Pulmonary:     Effort: Pulmonary effort is normal.     Breath sounds: Normal breath sounds. No decreased breath sounds, wheezing, rhonchi or rales.  Musculoskeletal:     Right lower leg: She exhibits no tenderness. No edema.     Left lower leg: She exhibits no tenderness. No edema.  Lymphadenopathy:     Cervical: No cervical adenopathy.  Skin:    General: Skin is warm and dry.     Findings: No rash.  Neurological:     Mental Status: She is alert and oriented to person, place, and time.  Psychiatric:        Behavior: Behavior normal.      ED Treatments / Results  Labs (all labs ordered are listed, but only abnormal results are displayed) Labs Reviewed  INFLUENZA PANEL BY PCR (TYPE A & B)    EKG None  Radiology Dg Chest 2 View  Result Date: 12/08/2018 CLINICAL DATA:  Two-day history of cough, shortness of breath and chest pain. Current history of asthma. Current smoker.  EXAM: CHEST - 2 VIEW COMPARISON:  10/26/2018 and earlier. FINDINGS: Cardiomediastinal silhouette unremarkable and unchanged. Mild hyperinflation, improved since the prior examination 10/04/2018. Mildly prominent bronchovascular markings diffusely and mild central peribronchial thickening, unchanged. Lungs otherwise clear. No localized airspace consolidation. No pleural effusions. No pneumothorax. Normal pulmonary vascularity. Visualized bony thorax intact. IMPRESSION: Stable mild changes of chronic bronchitis and/or asthma. No acute cardiopulmonary disease. Electronically Signed   By: Evangeline Dakin M.D.   On: 12/08/2018 11:11    Procedures Procedures (including critical care time)  Medications Ordered in ED Medications - No data to display   Initial Impression / Assessment and Plan / ED Course  I have reviewed the triage vital signs and the nursing notes.  Pertinent labs & imaging results that were available during my care of the patient were reviewed by me and considered in my medical decision making (see chart for details).  Clinical Course as of Dec 08 1242  Thu Dec 08, 5250  6118 42 year old female with history of COPD presents with complaint of nonproductive cough, congestion, body aches, sore throat, wheezing and shortness of breath.  Patient was admitted to the ICU and intubated 2 months ago.  Patient states that she was discharged with a prescription for medicine to put in a nebulizer however she does not have a nebulizer and is here today requesting the same.  Patient has mild posterior pharyngeal erythema without exudate, her lungs are clear.  Patient has all of her medications and is using as instructed otherwise.  Chest x-ray is unremarkable.  Offered flu swab for patient due to concerns for complications which may arise if she does have the flu.  Patient has refused the test as she does not want to wait any longer and needs to leave today.  Patient is well-appearing with normal vital  signs, able to speak in complete sentences.  Patient will be given prescription for nebulizer as requested.  Patient to return to ER for worsening or concerning symptoms otherwise should follow-up with her PCP.   [LM]    Clinical Course User Index [LM] Suella Broad  A, PA-C   Final Clinical Impressions(s) / ED Diagnoses   Final diagnoses:  Viral upper respiratory tract infection    ED Discharge Orders         Ordered    DME Nebulizer machine     12/08/18 1231           Tacy Learn, PA-C 12/08/18 1243    Hayden Rasmussen, MD 12/09/18 1055

## 2018-12-08 NOTE — ED Notes (Signed)
Patient transported to X-ray 

## 2018-12-14 NOTE — Progress Notes (Deleted)
Patient ID: Deborah Melendez, female   DOB: May 27, 1976, 42 y.o.   MRN: [id number]   After being seen in the ED 12/08/2018 for URI.  From ED notes: HPI Deborah Melendez is a 42 y.o. female.  42 year old female presents with complaint of cough, congestion, body aches, sore throat, wheezing and shortness of breath.  Patient has a history of COPD, she was admitted to the ICU and intubated 2 months ago.  Patient states she continues to have a hoarse voice since that time.  Patient has been using her inhalers as prescribed, requests nebulizer to use the medication she was discharged home with for nebulizer.  Patient denies fevers, chills, sick contacts.  Cough is nonproductive.  No other complaints or concerns.  From A/P: Patient has all of her medications and is using as instructed otherwise.  Chest x-ray is unremarkable.  Offered flu swab for patient due to concerns for complications which may arise if she does have the flu.  Patient has refused the test as she does not want to wait any longer and needs to leave today.  Patient is well-appearing with normal vital signs, able to speak in complete sentences.  Patient will be given prescription for nebulizer as requested.  Patient to return to ER for worsening or concerning symptoms otherwise should follow-up with her PCP.

## 2018-12-21 ENCOUNTER — Other Ambulatory Visit: Payer: Self-pay | Admitting: Registered Nurse

## 2018-12-21 ENCOUNTER — Encounter (HOSPITAL_COMMUNITY): Payer: Self-pay

## 2018-12-21 ENCOUNTER — Emergency Department (HOSPITAL_COMMUNITY): Payer: Self-pay

## 2018-12-21 ENCOUNTER — Other Ambulatory Visit: Payer: Self-pay

## 2018-12-21 ENCOUNTER — Emergency Department (HOSPITAL_COMMUNITY)
Admission: EM | Admit: 2018-12-21 | Discharge: 2018-12-21 | Disposition: A | Discharge status: Other Acute Inpatient Hospital | Payer: Self-pay | Attending: Emergency Medicine | Admitting: Emergency Medicine

## 2018-12-21 ENCOUNTER — Inpatient Hospital Stay (HOSPITAL_COMMUNITY)
Admission: AD | Admit: 2018-12-21 | Discharge: 2018-12-25 | DRG: 885 | Disposition: A | Discharge status: Home / Self Care | Payer: Federal, State, Local not specified - Other | Source: Intra-hospital | Attending: Psychiatry | Admitting: Psychiatry

## 2018-12-21 DIAGNOSIS — R45851 Suicidal ideations: Secondary | ICD-10-CM | POA: Diagnosis present

## 2018-12-21 DIAGNOSIS — E876 Hypokalemia: Secondary | ICD-10-CM | POA: Diagnosis present

## 2018-12-21 DIAGNOSIS — J449 Chronic obstructive pulmonary disease, unspecified: Secondary | ICD-10-CM | POA: Diagnosis present

## 2018-12-21 DIAGNOSIS — R000 Tachycardia, unspecified: Secondary | ICD-10-CM | POA: Insufficient documentation

## 2018-12-21 DIAGNOSIS — F17210 Nicotine dependence, cigarettes, uncomplicated: Secondary | ICD-10-CM | POA: Diagnosis present

## 2018-12-21 DIAGNOSIS — F1219 Cannabis abuse with unspecified cannabis-induced disorder: Secondary | ICD-10-CM

## 2018-12-21 DIAGNOSIS — Z046 Encounter for general psychiatric examination, requested by authority: Secondary | ICD-10-CM | POA: Insufficient documentation

## 2018-12-21 DIAGNOSIS — F1420 Cocaine dependence, uncomplicated: Secondary | ICD-10-CM | POA: Diagnosis present

## 2018-12-21 DIAGNOSIS — F1410 Cocaine abuse, uncomplicated: Secondary | ICD-10-CM | POA: Diagnosis not present

## 2018-12-21 DIAGNOSIS — Z9114 Patient's other noncompliance with medication regimen: Secondary | ICD-10-CM | POA: Insufficient documentation

## 2018-12-21 DIAGNOSIS — F17200 Nicotine dependence, unspecified, uncomplicated: Secondary | ICD-10-CM | POA: Diagnosis present

## 2018-12-21 DIAGNOSIS — F322 Major depressive disorder, single episode, severe without psychotic features: Secondary | ICD-10-CM | POA: Insufficient documentation

## 2018-12-21 DIAGNOSIS — F3130 Bipolar disorder, current episode depressed, mild or moderate severity, unspecified: Secondary | ICD-10-CM | POA: Diagnosis present

## 2018-12-21 DIAGNOSIS — F1210 Cannabis abuse, uncomplicated: Secondary | ICD-10-CM | POA: Diagnosis present

## 2018-12-21 DIAGNOSIS — F1910 Other psychoactive substance abuse, uncomplicated: Secondary | ICD-10-CM | POA: Insufficient documentation

## 2018-12-21 DIAGNOSIS — F329 Major depressive disorder, single episode, unspecified: Secondary | ICD-10-CM | POA: Insufficient documentation

## 2018-12-21 DIAGNOSIS — J45909 Unspecified asthma, uncomplicated: Secondary | ICD-10-CM

## 2018-12-21 DIAGNOSIS — Z813 Family history of other psychoactive substance abuse and dependence: Secondary | ICD-10-CM | POA: Diagnosis not present

## 2018-12-21 DIAGNOSIS — Z833 Family history of diabetes mellitus: Secondary | ICD-10-CM

## 2018-12-21 DIAGNOSIS — J4521 Mild intermittent asthma with (acute) exacerbation: Secondary | ICD-10-CM | POA: Insufficient documentation

## 2018-12-21 DIAGNOSIS — Z818 Family history of other mental and behavioral disorders: Secondary | ICD-10-CM

## 2018-12-21 DIAGNOSIS — R441 Visual hallucinations: Secondary | ICD-10-CM | POA: Insufficient documentation

## 2018-12-21 DIAGNOSIS — G4700 Insomnia, unspecified: Secondary | ICD-10-CM | POA: Diagnosis present

## 2018-12-21 DIAGNOSIS — F419 Anxiety disorder, unspecified: Secondary | ICD-10-CM | POA: Insufficient documentation

## 2018-12-21 LAB — CBC
HCT: 38.2 % (ref 36.0–46.0)
Hemoglobin: 11.9 g/dL — ABNORMAL LOW (ref 12.0–15.0)
MCH: 28.2 pg (ref 26.0–34.0)
MCHC: 31.2 g/dL (ref 30.0–36.0)
MCV: 90.5 fL (ref 80.0–100.0)
NRBC: 0 % (ref 0.0–0.2)
PLATELETS: 262 10*3/uL (ref 150–400)
RBC: 4.22 MIL/uL (ref 3.87–5.11)
RDW: 17.6 % — ABNORMAL HIGH (ref 11.5–15.5)
WBC: 8.1 10*3/uL (ref 4.0–10.5)

## 2018-12-21 LAB — COMPREHENSIVE METABOLIC PANEL
ALBUMIN: 3.9 g/dL (ref 3.5–5.0)
ALK PHOS: 54 U/L (ref 38–126)
ALT: 13 U/L (ref 0–44)
AST: 20 U/L (ref 15–41)
Anion gap: 15 (ref 5–15)
BILIRUBIN TOTAL: 0.9 mg/dL (ref 0.3–1.2)
BUN: 11 mg/dL (ref 6–20)
CO2: 21 mmol/L — AB (ref 22–32)
Calcium: 9.3 mg/dL (ref 8.9–10.3)
Chloride: 102 mmol/L (ref 98–111)
Creatinine, Ser: 1.11 mg/dL — ABNORMAL HIGH (ref 0.44–1.00)
GFR calc Af Amer: >60 mL/min (ref >60)
GFR calc non Af Amer: >60 mL/min (ref >60)
GLUCOSE: 104 mg/dL — AB (ref 70–99)
Potassium: 3.1 mmol/L — ABNORMAL LOW (ref 3.5–5.1)
SODIUM: 138 mmol/L (ref 135–145)
TOTAL PROTEIN: 7.2 g/dL (ref 6.5–8.1)

## 2018-12-21 LAB — ACETAMINOPHEN LEVEL

## 2018-12-21 LAB — INFLUENZA PANEL BY PCR (TYPE A & B)
INFLAPCR: NEGATIVE
Influenza B By PCR: NEGATIVE

## 2018-12-21 LAB — RAPID URINE DRUG SCREEN, HOSP PERFORMED
Amphetamines: NOT DETECTED
BENZODIAZEPINES: NOT DETECTED
Barbiturates: NOT DETECTED
COCAINE: POSITIVE — AB
Opiates: NOT DETECTED
Tetrahydrocannabinol: POSITIVE — AB

## 2018-12-21 LAB — SALICYLATE LEVEL

## 2018-12-21 LAB — I-STAT BETA HCG BLOOD, ED (MC, WL, AP ONLY): I-stat hCG, quantitative: <5 m[IU]/mL (ref <5)

## 2018-12-21 LAB — ETHANOL

## 2018-12-21 MED ORDER — GABAPENTIN 100 MG PO CAPS
100.0000 mg | ORAL_CAPSULE | Freq: Two times a day (BID) | ORAL | Status: DC
  Administered 2018-12-21: 100 mg via ORAL
  Filled 2018-12-21: qty 1

## 2018-12-21 MED ORDER — SERTRALINE HCL 100 MG PO TABS
100.0000 mg | ORAL_TABLET | Freq: Every day | ORAL | Status: DC
  Administered 2018-12-21 (×2): 100 mg via ORAL
  Filled 2018-12-21 (×3): qty 1

## 2018-12-21 MED ORDER — HYDROXYZINE HCL 50 MG PO TABS: 50.0000 mg | ORAL_TABLET | Freq: Three times a day (TID) | ORAL | Status: DC | PRN

## 2018-12-21 MED ORDER — ALBUTEROL SULFATE HFA 108 (90 BASE) MCG/ACT IN AERS: 1.0000 | INHALATION_SPRAY | Freq: Four times a day (QID) | RESPIRATORY_TRACT | Status: DC | PRN

## 2018-12-21 MED ORDER — QUETIAPINE FUMARATE 50 MG PO TABS
300.0000 mg | ORAL_TABLET | Freq: Every day | ORAL | Status: DC
  Administered 2018-12-21: 300 mg via ORAL
  Filled 2018-12-21: qty 1
  Filled 2018-12-21: qty 2

## 2018-12-21 MED ORDER — POTASSIUM CHLORIDE CRYS ER 20 MEQ PO TBCR
40.0000 meq | EXTENDED_RELEASE_TABLET | Freq: Once | ORAL | Status: AC
  Administered 2018-12-21: 40 meq via ORAL
  Filled 2018-12-21: qty 2

## 2018-12-21 MED ORDER — METHYLPREDNISOLONE SODIUM SUCC 125 MG IJ SOLR
125.0000 mg | Freq: Once | INTRAMUSCULAR | Status: AC
  Administered 2018-12-21: 125 mg via INTRAVENOUS
  Filled 2018-12-21: qty 2

## 2018-12-21 MED ORDER — QUETIAPINE FUMARATE 200 MG PO TABS
200.0000 mg | ORAL_TABLET | Freq: Every day | ORAL | Status: DC
  Administered 2018-12-21 – 2018-12-24 (×4): 200 mg via ORAL
  Filled 2018-12-21 (×6): qty 1
  Filled 2018-12-21: qty 7
  Filled 2018-12-21: qty 1

## 2018-12-21 MED ORDER — TRAZODONE HCL 100 MG PO TABS
100.0000 mg | ORAL_TABLET | Freq: Every evening | ORAL | Status: DC | PRN
  Administered 2018-12-21 – 2018-12-24 (×4): 100 mg via ORAL
  Filled 2018-12-21 (×2): qty 1
  Filled 2018-12-21: qty 7
  Filled 2018-12-21 (×2): qty 1

## 2018-12-21 MED ORDER — ALBUTEROL SULFATE HFA 108 (90 BASE) MCG/ACT IN AERS
1.0000 | INHALATION_SPRAY | Freq: Four times a day (QID) | RESPIRATORY_TRACT | Status: DC | PRN
  Administered 2018-12-21 – 2018-12-22 (×2): 2 via RESPIRATORY_TRACT
  Administered 2018-12-23 – 2018-12-24 (×2): 1 via RESPIRATORY_TRACT
  Filled 2018-12-21 (×2): qty 6.7

## 2018-12-21 MED ORDER — FLUOXETINE HCL 20 MG PO CAPS
20.0000 mg | ORAL_CAPSULE | Freq: Every day | ORAL | Status: DC
  Administered 2018-12-21: 20 mg via ORAL
  Filled 2018-12-21 (×4): qty 1

## 2018-12-21 MED ORDER — NICOTINE 21 MG/24HR TD PT24
21.0000 mg | MEDICATED_PATCH | Freq: Every day | TRANSDERMAL | Status: DC
  Filled 2018-12-21 (×7): qty 1

## 2018-12-21 MED ORDER — ALBUTEROL SULFATE HFA 108 (90 BASE) MCG/ACT IN AERS: 2.0000 | INHALATION_SPRAY | RESPIRATORY_TRACT | Status: DC | PRN

## 2018-12-21 MED ORDER — FUROSEMIDE 20 MG PO TABS
20.0000 mg | ORAL_TABLET | Freq: Every day | ORAL | Status: DC
  Administered 2018-12-21: 20 mg via ORAL
  Filled 2018-12-21: qty 1

## 2018-12-21 MED ORDER — TRAZODONE HCL 50 MG PO TABS
50.0000 mg | ORAL_TABLET | Freq: Every evening | ORAL | Status: DC | PRN
  Filled 2018-12-21: qty 1

## 2018-12-21 MED ORDER — POTASSIUM CHLORIDE CRYS ER 20 MEQ PO TBCR
20.0000 meq | EXTENDED_RELEASE_TABLET | Freq: Two times a day (BID) | ORAL | Status: AC
  Administered 2018-12-21 – 2018-12-22 (×2): 20 meq via ORAL
  Filled 2018-12-21 (×4): qty 1

## 2018-12-21 MED ORDER — ALBUTEROL SULFATE (2.5 MG/3ML) 0.083% IN NEBU
5.0000 mg | INHALATION_SOLUTION | Freq: Once | RESPIRATORY_TRACT | Status: AC
  Administered 2018-12-21: 5 mg via RESPIRATORY_TRACT
  Filled 2018-12-21: qty 6

## 2018-12-21 MED ORDER — ALBUTEROL SULFATE (2.5 MG/3ML) 0.083% IN NEBU
2.5000 mg | INHALATION_SOLUTION | Freq: Four times a day (QID) | RESPIRATORY_TRACT | Status: DC | PRN
  Administered 2018-12-22: 2.5 mg via RESPIRATORY_TRACT
  Filled 2018-12-21: qty 3

## 2018-12-21 MED ORDER — SODIUM CHLORIDE 0.9 % IV BOLUS
1000.0000 mL | Freq: Once | INTRAVENOUS | Status: AC
  Administered 2018-12-21: 1000 mL via INTRAVENOUS

## 2018-12-21 MED ORDER — IPRATROPIUM BROMIDE 0.02 % IN SOLN
0.5000 mg | Freq: Once | RESPIRATORY_TRACT | Status: AC
  Administered 2018-12-21: 0.5 mg via RESPIRATORY_TRACT
  Filled 2018-12-21: qty 2.5

## 2018-12-21 MED ORDER — PREDNISONE 20 MG PO TABS: 40.0000 mg | ORAL_TABLET | Freq: Every day | ORAL | Status: DC

## 2018-12-21 MED ORDER — TRAZODONE HCL 50 MG PO TABS: 50.0000 mg | ORAL_TABLET | Freq: Every evening | ORAL | Status: DC | PRN

## 2018-12-21 NOTE — Tx Team (Signed)
Initial Treatment Plan 12/21/2018 3:48 PM [name] Hamman KCM:[id number]    PATIENT STRESSORS: Medication change or noncompliance Substance abuse   PATIENT STRENGTHS: Ability for insight General fund of knowledge Physical Health   PATIENT IDENTIFIED PROBLEMS: Pt declines to identify goals for treatment  Psychosis   Substance Abuse                 DISCHARGE CRITERIA:  Ability to meet basic life and health needs Adequate post-discharge living arrangements Improved stabilization in mood, thinking, and/or behavior Medical problems require only outpatient monitoring Motivation to continue treatment in a less acute level of care Need for constant or close observation no longer present Reduction of life-threatening or endangering symptoms to within safe limits Safe-care adequate arrangements made Verbal commitment to aftercare and medication compliance Withdrawal symptoms are absent or subacute and managed without 24-hour nursing intervention  PRELIMINARY DISCHARGE PLAN: Outpatient therapy  PATIENT/FAMILY INVOLVEMENT: This treatment plan has been presented to and reviewed with the patient, Deborah Melendez.  The patient and family have been given the opportunity to ask questions and make suggestions.  Annia Friendly, RN 12/21/2018, 3:48 PM

## 2018-12-21 NOTE — ED Triage Notes (Signed)
Pt comes from home via [facility], hx of asthma and COPD, ran out of inhaler several days ago, PTA received 5 mg albuterol, pt has also been having several days of depression with suicidal thoughts, no plan. Denies HI/AVH

## 2018-12-21 NOTE — [facility] Suicide Risk Assessment (Signed)
[facility] Admission Suicide Risk Assessment   Nursing information obtained from:  Patient, Review of record Demographic factors:  Low socioeconomic status, Living alone, Unemployed Current Mental Status:  Suicidal ideation indicated by patient Loss Factors:  Legal issues Historical Factors:  NA Risk Reduction Factors:  Sense of responsibility to family, Religious beliefs about death  Total Time spent with patient: 45 minutes Principal Problem:  MDD, consider Substance Induced Mood Disorder, Cocaine Abuse  Diagnosis:  Active Problems:   MDD (major depressive disorder), severe (HCC)  Subjective Data:   Continued Clinical Symptoms:    The "Alcohol Use Disorders Identification Test", Guidelines for Use in Primary Care, Second Edition.  World Pharmacologist [facility]). Score between 0-7:  no or low risk or alcohol related problems. Score between 8-15:  moderate risk of alcohol related problems. Score between 16-19:  high risk of alcohol related problems. Score 20 or above:  warrants further diagnostic evaluation for alcohol dependence and treatment.   CLINICAL FACTORS:  74, lives with BF, has a 35 year old daughter who is currently with child's father. Currently unemployed. Patient reports she called EMS yesterday and was brought to ED. States she has been depressed,sad, with suicidal ideations , without specific plan or intention. Reports neuro-vegetative symptoms- poor sleep, poor energy, anhedonia. Denies psychotic symptoms. Reports using Cocaine several times per week. Admission UDS positive for Cocaine and Cannabis. Denies alcohol abuse.  Reports she is facing multiple stressors- BF is facing possible deportation, , drug abuse,recent medical admission for COPD exacerbation in October 2019. 12/25 Chest X ray reported as negative . Reports she had not been taking medications regularly prior to admission. She had been prescribed Seroquel and Zoloft in the past, off them for more than a  month.  Reports history of Asthma, COPD. NKDA.  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 Cocaine Use Disorder.  Plan- Inpatient admission Resume albuterol inhaler which she states she had run out of prior to admission. We discussed restarting Zoloft, prefers to start new antidepressant as does not feel Zoloft was working that much. Agrees to Prozac trial, side effects discussed. Start Prozac 20 mgrs QDAY for depression Start KDUR supplementation for Hypokalemia    Musculoskeletal: Strength & Muscle Tone: within normal limits Gait & Station: normal Patient leans: N/A  Psychiatric Specialty Exam: Physical Exam  ROS no headache, no chest pain, reports coughing x several weeks, no shortness of breath at room air and pulse ox currently 100 %  Blood pressure (!) 124/93, pulse 98, temperature 98.4 F (36.9 C), temperature source Oral, resp. rate 18, height 5\' 4"  (1.626 m), weight 75 kg, last menstrual period 12/07/2018, SpO2 100 %.Body mass index is 28.38 kg/m.  General Appearance: Fairly Groomed  Eye Contact:  Fair  Speech:  Normal Rate  Volume:  Decreased  Mood:  Depressed  Affect:  Congruent  Thought Process:  Linear and Descriptions of Associations: Intact  Orientation:  Full (Time, Place, and Person)  Thought Content:  no hallucinations, no delusions, not internally preoccupied   Suicidal Thoughts:  No denies suicidal or self injurious ideations, contracts for safety on unit, denies homicidal or violent ideations  Homicidal Thoughts:  No  Memory:  recent and remote grossly intact   Judgement:  Fair  Insight:  Fair  Psychomotor Activity:  Normal  Concentration:  Concentration: Good and Attention Span: Good  Recall:  Good  Fund of Knowledge:  Good  Language:  Good  Akathisia:  Negative  Handed:  Right  AIMS (if  indicated):     Assets:  Communication Skills Desire for Improvement Resilience  ADL's:  Intact  Cognition:  WNL  Sleep:         COGNITIVE FEATURES  THAT CONTRIBUTE TO RISK:  Closed-mindedness and Loss of executive function    SUICIDE RISK:   Moderate:  Frequent suicidal ideation with limited intensity, and duration, some specificity in terms of plans, no associated intent, good self-control, limited dysphoria/symptomatology, some risk factors present, and identifiable protective factors, including available and accessible social support.  PLAN OF CARE: Patient will be admitted to inpatient psychiatric unit for stabilization and safety. Will provide and encourage milieu participation. Provide medication management and maked adjustments as needed.  Will follow daily.    I certify that inpatient services furnished can reasonably be expected to improve the patient's condition.   Jenne Campus, MD 12/21/2018, 4:08 PM
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
## 2018-12-21 NOTE — ED Notes (Addendum)
Placed in purple scrubs and staffing called for sitter

## 2018-12-21 NOTE — H&P (Addendum)
Psychiatric Admission Assessment Adult  Patient Identification: Deborah Melendez MRN:  [id number] Date of Evaluation:  12/21/2018 Chief Complaint:  BIPOLAR; MANIC Principal Diagnosis: Bipolar I disorder, most recent episode depressed ([place]) Diagnosis:  Principal Problem:   Bipolar I disorder, most recent episode depressed ([place]) Active Problems:   Suicidal ideation   Cocaine use disorder, severe, dependence ([place])   Tobacco use disorder   Asthma, chronic  History of Present Illness:  Pt is a 42yo unemployed female, living with her boyfriend of 7 years, with a 60yo daughter living with the father of the child. Pt has a history of bipolar-1 depression, polysubstance abuse including crack/cocaine, benzos, THC, insomnia, and severe depression. Pt states PTSD but does not identify any trauma to this provider. Patient reports she called EMS yesterday and was brought to ED. States she has been depressed,sad, with suicidal ideation, with thoughts of overdosing on her Trazodone. Reports neuro-vegetative symptoms- poor sleep, poor energy, anhedonia. Denies psychotic symptoms yet history of manic and hypomanic episodes. Reports using Cocaine several times per week. Admission UDS positive for Cocaine and Cannabis. Denies alcohol abuse. Reports she is facing multiple stressors- BF is facing possible deportation, , drug abuse,recent medical admission for COPD exacerbation in October 2019 with multiple intubations and a 15 day inpatient stay. 12/25 CXR unremarkable. Reports she had not been taking medications regularly prior to admission. She had been prescribed Seroquel and Zoloft in the past, off them for more than a month. Pt was on 300mg  Seroquel QHS and Zoloft 100mg  in addition to Trazodone 300mg . Informed pt that we will trial Prozac, restart Seroquel at 200mg  (QTc 428ms), and do PRN Trazodone at 100mg  to assess tolerance and efficacy of regimen before upward titration.   Today, pt seen and chart  reviewed for H&P: Pt is alert/oriented x4, calm, cooperative, and appropriate to situation. Pt denies suicidal/homicidal ideation and psychosis and does not appear to be responding to internal stimuli. Pt does present as restless and mildly hypomanic. We considered moving her to 300 hall, yet will hold off for now and observe her behavior for >24h before making this decision. Pt reports she has been very depressed with her boyfriend and cites this as the primary cause for her depression. Pt reports having a bottle of Trazodone and taking 2, then considering taking 2 every couple hours to "see what happens" when she felt hopeless prior to coming to the ED. Pt is able to contract for safety in the hospital yet fearful of ability to refrain from suicide outside of hospital at this time.   Associated Signs/Symptoms: Depression Symptoms:  depressed mood, anhedonia, insomnia, psychomotor agitation, hopelessness, recurrent thoughts of death, suicidal thoughts with specific plan, (Hypo) Manic Symptoms:  Distractibility, Flight of Ideas, Impulsivity, hypomanic Anxiety Symptoms:  Excessive Worry, Psychotic Symptoms:  denies PTSD Symptoms: NA Total Time spent with patient: 45 minutes  Past Psychiatric History: BP-1, cocaine/crack abuse, benzo abuse, depression  Is the patient at risk to self? Yes.    Has the patient been a risk to self in the past 6 months? Yes.    Has the patient been a risk to self within the distant past? Yes.    Is the patient a risk to others? No.  Has the patient been a risk to others in the past 6 months? No.  Has the patient been a risk to others within the distant past? No.   Prior Inpatient Therapy:   Prior Outpatient Therapy:    Alcohol Screening: Patient refused Alcohol  Screening Tool: Yes Intervention/Follow-up: Patient Refused Substance Abuse History in the last 12 months:  Yes.   Consequences of Substance Abuse: destabilization Previous Psychotropic  Medications: Yes  Psychological Evaluations: Yes  Past Medical History:  Past Medical History:  Diagnosis Date  . Asthma   . Bipolar 1 disorder, depressed, severe ([place]) 02/04/2017  . Cocaine abuse with cocaine-induced mood disorder ([place]) 07/27/2017  . COPD (chronic obstructive pulmonary disease) ([place])   . Depression   . Homicidal ideations   . Manic behavior ([place])   . MDD (major depressive disorder), recurrent severe, without psychosis ([place]) 08/08/2015  . Substance or medication-induced bipolar and related disorder with onset during intoxication ([place]) 09/08/2016  . Suicidal ideation     Past Surgical History:  Procedure Laterality Date  . FINGER SURGERY     Family History:  Family History  Problem Relation Age of Onset  . Diabetes Mother   . Hypertension Mother   . Drug abuse Father   . Schizophrenia Maternal Aunt    Family Psychiatric  History: depression Tobacco Screening: Have you used any form of tobacco in the last 30 days? (Cigarettes, Smokeless Tobacco, Cigars, and/or Pipes): No Social History:  Social History   Substance and Sexual Activity  Alcohol Use Yes   Comment: pt denies drinking at this time     Social History   Substance and Sexual Activity  Drug Use Yes  . Frequency: 1.0 times per week  . Types: Cocaine, Marijuana   Comment: last use 12/06/18    Additional Social History:                           Allergies:  No Known Allergies Lab Results:  Results for orders placed or performed during the hospital encounter of 12/21/18 (from the past 48 hour(s))  Comprehensive metabolic panel     Status: Abnormal   Collection Time: 12/21/18  2:04 AM  Result Value Ref Range   Sodium 138 135 - 145 mmol/L   Potassium 3.1 (L) 3.5 - 5.1 mmol/L   Chloride 102 98 - 111 mmol/L   CO2 21 (L) 22 - 32 mmol/L   Glucose, Bld 104 (H) 70 - 99 mg/dL   BUN 11 6 - 20 mg/dL   Creatinine, Ser 1.11 (H) 0.44 - 1.00 mg/dL   Calcium 9.3 8.9 - 10.3 mg/dL   Total Protein  7.2 6.5 - 8.1 g/dL   Albumin 3.9 3.5 - 5.0 g/dL   AST 20 15 - 41 U/L   ALT 13 0 - 44 U/L   Alkaline Phosphatase 54 38 - 126 U/L   Total Bilirubin 0.9 0.3 - 1.2 mg/dL   GFR calc non Af Amer >60 >60 mL/min   GFR calc Af Amer >60 >60 mL/min   Anion gap 15 5 - 15    Comment: Performed at [facility], [address] [place], [place] [address]  Ethanol     Status: None   Collection Time: 12/21/18  2:04 AM  Result Value Ref Range   Alcohol, Ethyl (B) <10 <10 mg/dL    Comment: (NOTE) Lowest detectable limit for serum alcohol is 10 mg/dL. For medical purposes only. Performed at [facility], [place] [address], [place], [place] [address]   Salicylate level     Status: None   Collection Time: 12/21/18  2:04 AM  Result Value Ref Range   Salicylate Lvl <1.0 2.8 - 30.0 mg/dL  Comment: Performed at [facility], [place] [address], [place], [place] [address]  Acetaminophen level     Status: Abnormal   Collection Time: 12/21/18  2:04 AM  Result Value Ref Range   Acetaminophen (Tylenol), Serum <10 (L) 10 - 30 ug/mL    Comment: (NOTE) Therapeutic concentrations vary significantly. A range of 10-30 ug/mL  may be an effective concentration for many patients. However, some  are best treated at concentrations outside of this range. Acetaminophen concentrations >150 ug/mL at 4 hours after ingestion  and >50 ug/mL at 12 hours after ingestion are often associated with  toxic reactions. Performed at [facility], [place] [address], [place], [place] [address]   cbc     Status: Abnormal   Collection Time: 12/21/18  2:04 AM  Result Value Ref Range   WBC 8.1 4.0 - 10.5 K/uL   RBC 4.22 3.87 - 5.11 MIL/uL   Hemoglobin 11.9 (L) 12.0 - 15.0 g/dL   HCT 38.2 36.0 - 46.0 %   MCV 90.5 80.0 - 100.0 fL   MCH 28.2 26.0 - 34.0 pg   MCHC 31.2 30.0 - 36.0 g/dL   RDW 17.6 (H) 11.5 - 15.5 %   Platelets 262 150 - 400 K/uL   nRBC 0.0 0.0 - 0.2 %    Comment: Performed at [facility],  [place] [address], [place], [place] [address]  I-Stat beta hCG blood, ED     Status: None   Collection Time: 12/21/18  2:06 AM  Result Value Ref Range   I-stat hCG, quantitative <5.0 <5 mIU/mL   Comment 3            Comment:   GEST. AGE      CONC.  (mIU/mL)   <=1 WEEK        5 - 50     2 WEEKS       50 - 500     3 WEEKS       100 - 10,000     4 WEEKS     1,000 - 30,000        FEMALE AND NON-PREGNANT FEMALE:     LESS THAN 5 mIU/mL   Influenza panel by PCR (type A & B)     Status: None   Collection Time: 12/21/18  2:43 AM  Result Value Ref Range   Influenza A By PCR NEGATIVE NEGATIVE   Influenza B By PCR NEGATIVE NEGATIVE    Comment: (NOTE) The Xpert Xpress Flu assay is intended as an aid in the diagnosis of  influenza and should not be used as a sole basis for treatment.  This  assay is FDA approved for nasopharyngeal swab specimens only. Nasal  washings and aspirates are unacceptable for Xpert Xpress Flu testing. Performed at [facility], [place] [address] [place], [place] [address]   Rapid urine drug screen (hospital performed)     Status: Abnormal   Collection Time: 12/21/18  8:42 AM  Result Value Ref Range   Opiates NONE DETECTED NONE DETECTED   Cocaine POSITIVE (A) NONE DETECTED   Benzodiazepines NONE DETECTED NONE DETECTED   Amphetamines NONE DETECTED NONE DETECTED   Tetrahydrocannabinol POSITIVE (A) NONE DETECTED   Barbiturates NONE DETECTED NONE DETECTED    Comment: (NOTE) DRUG SCREEN FOR MEDICAL PURPOSES ONLY.  IF CONFIRMATION IS NEEDED FOR ANY PURPOSE, NOTIFY LAB WITHIN 5 DAYS. LOWEST DETECTABLE LIMITS FOR URINE DRUG SCREEN Drug Class  Cutoff (ng/mL) Amphetamine and metabolites    1000 Barbiturate and metabolites    200 Benzodiazepine                 841 Tricyclics and metabolites     300 Opiates and metabolites        300 Cocaine and metabolites        300 THC                            50 Performed at [facility], [place] [address], [place], [place] [address]     Blood Alcohol level:  Lab Results  Component Value Date   ETH <10 12/21/2018   ETH <10 10/24/2535    Metabolic Disorder Labs:  Lab Results  Component Value Date   HGBA1C 5.3 08/25/2018   MPG 105.41 08/25/2018   MPG 114.02 10/09/2017   No results found for: PROLACTIN Lab Results  Component Value Date   CHOL 148 08/25/2018   TRIG 99 10/21/2018   HDL 52 08/25/2018   CHOLHDL 2.8 08/25/2018   VLDL 13 08/25/2018   LDLCALC 83 08/25/2018   LDLCALC 69 10/09/2017    Current Medications: Current Facility-Administered Medications  Medication Dose Route Frequency Provider Last Rate Last Dose  . albuterol (PROVENTIL HFA;VENTOLIN HFA) 108 (90 Base) MCG/ACT inhaler 1-2 puff  1-2 puff Inhalation Q6H PRN Withrow, Elyse Jarvis, FNP      . albuterol (PROVENTIL) (2.5 MG/3ML) 0.083% nebulizer solution 2.5 mg  2.5 mg Nebulization Q6H PRN Withrow, Elyse Jarvis, FNP      . FLUoxetine (PROZAC) capsule 20 mg  20 mg Oral Daily Jaylynn Mcaleer, Myer Peer, MD   20 mg at 12/21/18 1728  . potassium chloride SA (K-DUR,KLOR-CON) CR tablet 20 mEq  20 mEq Oral BID Estrellita Lasky, Myer Peer, MD   20 mEq at 12/21/18 1728  . QUEtiapine (SEROQUEL) tablet 200 mg  200 mg Oral QHS Withrow, John C, FNP      . traZODone (DESYREL) tablet 100 mg  100 mg Oral QHS PRN Withrow, Elyse Jarvis, FNP       PTA Medications: No medications prior to admission.    Musculoskeletal: Strength & Muscle Tone: within normal limits Gait & Station: normal Patient leans: N/A  Psychiatric Specialty Exam: Physical Exam  Review of Systems  Respiratory: Positive for shortness of breath.   Psychiatric/Behavioral: Positive for depression, substance abuse and suicidal ideas. Negative for hallucinations. The patient is nervous/anxious and has insomnia.   All other systems reviewed and are negative.   Blood pressure 128/83, pulse (!) 101, temperature 98 F (36.7 C), temperature source Oral, resp. rate 18, height 5\' 4"  (1.626 m), weight 75  kg, last menstrual period 12/07/2018, SpO2 100 %.Body mass index is 28.38 kg/m.  General Appearance: Casual and Fairly Groomed  Eye Contact:  Fair  Speech:  Clear and Coherent and Normal Rate  Volume:  Normal  Mood:  Anxious and Depressed  Affect:  Appropriate, Congruent and Depressed  Thought Process:  Coherent, Goal Directed, Linear and Descriptions of Associations: Intact  Orientation:  Full (Time, Place, and Person)  Thought Content:  Focused on treatment options  Suicidal Thoughts:  Yes with thoughts of overdose  Homicidal Thoughts:  No  Memory:  Immediate;   Fair Recent;   Fair Remote;   Fair  Judgement:  Fair  Insight:  Fair  Psychomotor Activity:  Normal  Concentration:  Concentration: Fair and Attention Span: Fair  Recall:  [place] of Knowledge:  Fair  Language:  Fair  Akathisia:  No  Handed:    AIMS (if indicated):     Assets:  Communication Skills Resilience  ADL's:  Intact  Cognition:  WNL  Sleep:       Treatment Plan Summary: Bipolar I disorder, most recent episode depressed ([facility]) -Seroquel 200mg  PO QHS  (QTc 447ms) -Prozac 20mg  PO QD  Insomnia -Trazodone 100mg  PO QHS PRN  Cocaine use disorder, severe, dependence -Encourage attendance at [facility]  -Nicotine dependence Nicoderm patch 21mg  QD  Asthma, chronic, severe -Albuterol HFA 108 1-2 puffs Q6H PRN shortness of breath -Albuterol 2.5 Nebulizer; Q6H PRN respiratory distress (moderate to severe)  Asymptomatic Hypokalemia (K+ 3.1) -Kdur 30meq PO BID x 3 doses  Labs/tests: -CBC, CMP, EKG reviewed and unremarkable aside from noted exceptions above   Physician Treatment Plan for Primary Diagnosis: Bipolar I disorder, most recent episode depressed ([place]) Long Term Goal(s): Improvement in symptoms so as ready for discharge  Short Term Goals: Ability to identify changes in lifestyle to reduce recurrence of condition will improve, Ability to verbalize feelings will improve, Ability to disclose  and discuss suicidal ideas, Ability to demonstrate self-control will improve, Ability to identify and develop effective coping behaviors will improve, Ability to maintain clinical measurements within normal limits will improve, Compliance with prescribed medications will improve and Ability to identify triggers associated with substance abuse/mental health issues will improve  Physician Treatment Plan for Secondary Diagnosis: Principal Problem:   Bipolar I disorder, most recent episode depressed ([place]) Active Problems:   Suicidal ideation   Cocaine use disorder, severe, dependence ([place])   Tobacco use disorder   Asthma, chronic  Long Term Goal(s): Improvement in symptoms so as ready for discharge  Short Term Goals: Ability to identify changes in lifestyle to reduce recurrence of condition will improve, Ability to verbalize feelings will improve, Ability to disclose and discuss suicidal ideas, Ability to demonstrate self-control will improve, Ability to identify and develop effective coping behaviors will improve, Ability to maintain clinical measurements within normal limits will improve, Compliance with prescribed medications will improve and Ability to identify triggers associated with substance abuse/mental health issues will improve  I certify that inpatient services furnished can reasonably be expected to improve the patient's condition.    Benjamine Mola, [place] 12/25/20195:36 PM   Patient case reviewed with NP, agree with assessment 55, lives with BF, has a 60 year old daughter who is currently with child's father. Currently unemployed. Patient reports she called EMS yesterday and was brought to ED. States she has been depressed,sad, with suicidal ideations , without specific plan or intention. Reports neuro-vegetative symptoms- poor sleep, poor energy, anhedonia. Denies psychotic symptoms. Reports using Cocaine several times per week. Admission UDS positive for Cocaine and Cannabis. Denies  alcohol abuse.  Reports she is facing multiple stressors- BF is facing possible deportation, , drug abuse,recent medical admission for COPD exacerbation in October 2019. 12/25 Chest X ray reported as negative . Reports she had not been taking medications regularly prior to admission. She had been prescribed Seroquel and Zoloft in the past, off them for more than a month.  Reports history of Asthma, COPD. NKDA.  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 Cocaine Use Disorder.  Plan- Inpatient admission Resume albuterol inhaler which she states she had run out of prior to admission. We discussed restarting Zoloft, prefers to start new antidepressant as does not feel Zoloft was working that much. Agrees  to Prozac trial, side effects discussed. Start Prozac 20 mgrs QDAY for depression Start KDUR supplementation for Hypokalemia

## 2018-12-21 NOTE — Progress Notes (Signed)
Psychoeducational Group Note  Date:  12/21/2018 Time:  2052  Group Topic/Focus:  Wrap-Up Group:   The focus of this group is to help patients review their daily goal of treatment and discuss progress on daily workbooks.  Participation Level: Did Not Attend  Participation Quality:  Not Applicable  Affect:  Not Applicable  Cognitive:  Not Applicable  Insight:  Not Applicable  Engagement in Group: Not Applicable  Additional Comments:  The patient did not attend group since she was sleeping in her bedroom.   Archie Balboa S 12/21/2018, 8:52 PM

## 2018-12-21 NOTE — ED Provider Notes (Signed)
Admitted to [facility].   Lennice Sites, DO 12/21/18 1125

## 2018-12-21 NOTE — Progress Notes (Signed)
Patient ID: [name], female   DOB: [birth year], 42 y.o.   MRN: [id number]  Admission Note  D) Patient admitted to the adult unit 500 hall. Patient is a 42 year old female who is voluntary from [facility] ED. Patient presents with irritable mood during the admission and states, "I feel like I'm having a panic attack, I just need to lie down". Patient signed consent forms but refused to engage in admission assessment. Information for admission obtained per chart review. Per chart review, patient has been abusing crack cocaine and hearing voices. Patient currently denies SI/HI.  Skin assessment was completed and unremarkable. Patient belongings searched with no contraband found. Belongings in locker #28. Vital signs obtained. Snacks and fluids offered.    A) Plan of care, unit policies and patient expectations were explained. Written consents obtained. Patient oriented to the unit and their room. Patient placed on standard q15 safety checks. High fall risk precautions initiated and reviewed with patient.   R) Patient is in no acute distress and verbalizes understanding of information provided. Patient with no concerns at this time. Patient contracts for safety with staff on the unit.

## 2018-12-21 NOTE — BH Assessment (Signed)
[place] Assessment Progress Note    Patient has been accepted to [facility].  Earleen Newport, NP accepting, Dr Jake Samples attending.  Patient can arrive after 9:30.  Please call report to [phone] and have patient to sign voluntary paperwork required for admission.

## 2018-12-21 NOTE — ED Notes (Signed)
TTS completed. 

## 2018-12-21 NOTE — ED Notes (Signed)
Patient transported to X-ray 

## 2018-12-21 NOTE — ED Notes (Signed)
Belongings placed in locker and medications locked up in pharmacy

## 2018-12-21 NOTE — ED Provider Notes (Signed)
[facility] EMERGENCY DEPARTMENT Provider Note   CSN: [id number] Arrival date & time: 12/21/18  0134     History   Chief Complaint Chief Complaint  Patient presents with  . Shortness of Breath  . Suicidal    HPI Deborah Melendez is a 42 y.o. female with a hx of asthma, COPD, depression, anxiety presents to the Emergency Department complaining of gradual, persistent, progressively worsening shortness of breath onset approximately 1 hour prior to arrival.  Patient reports she ran out of her inhaler this morning.  She reports she was feeling depressed, suicidal and anxious when she began to have trouble breathing.  She reports that trouble breathing worsened her other symptoms.  She is out of her albuterol inhaler.  Patient does report a cough for several months.  She denies fever, chills, headache, neck pain, chest pain, domino pain, nausea, vomiting, diarrhea, weakness, dizziness, syncope.  Per EMS, patient wheezing upon their arrival.  Albuterol given.  Patient reports she feels much better after the albuterol but is not quite breathing at baseline.  No specific aggravating or alleviating factors.   Additionally, patient reports she feels suicidal tonight.  She reports numerous external stressors causing this feeling.  She reports multiple attempts in the past with the last one being approximately 2 years ago.  Patient reports she was last hospitalized for suicidal ideation and depression in August or September 2019.  She denies a plan today.  She denies any self-harm today.  Patient denies homicidal ideation.  She does report some visual hallucinations stating she "sees things crawling everywhere."  She denies command auditory hallucinations.  Patient reports she has not been taking any of her medications.   The history is provided by the patient, medical records and the EMS personnel. No language interpreter was used.    Past Medical History:  Diagnosis Date    . Asthma   . Bipolar 1 disorder, depressed, severe ([place]) 02/04/2017  . Cocaine abuse with cocaine-induced mood disorder ([place]) 07/27/2017  . COPD (chronic obstructive pulmonary disease) ([place])   . Depression   . Homicidal ideations   . Manic behavior ([place])   . 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 Suicidal ideation     Patient Active Problem List   Diagnosis Date Noted  . Altered mental status   . Acute on chronic respiratory failure with hypoxia ([place]) 10/14/2018  . Bronchitis 10/05/2018  . Acute bronchitis with asthma with acute exacerbation 10/04/2018  . Major depressive disorder, recurrent episode with mixed features ([place]) 01/27/2018  . Tobacco use disorder 05/22/2017  . Suicidal ideation   . Cannabis use disorder, severe, dependence ([place]) 01/20/2017  . Cocaine use disorder, severe, dependence ([place]) 08/08/2015    Past Surgical History:  Procedure Laterality Date  . FINGER SURGERY       OB History    Gravida  6   Para  4   Term      Preterm      AB      Living  4     SAB      TAB      Ectopic      Multiple      Live Births  0            Home Medications    Prior to Admission medications   Medication Sig Start Date End  Date Taking? Authorizing Provider  acetaminophen (TYLENOL) 325 MG tablet Take 2 tablets (650 mg total) by mouth every 6 (six) hours as needed for mild pain (or Fever >/= 101). Patient not taking: Reported on 11/13/2018 10/06/18   Dorrell, Andree Elk, MD  albuterol (PROVENTIL HFA;VENTOLIN HFA) 108 (90 Base) MCG/ACT inhaler Inhale 2 puffs into the lungs every 6 (six) hours as needed for wheezing or shortness of breath. Patient not taking: Reported on 12/08/2018 10/06/18   Dorrell, Andree Elk, MD  famotidine (PEPCID) 20 MG tablet Take 1 tablet (20 mg total) by mouth 2 (two) times  daily. Patient not taking: Reported on 11/13/2018 10/28/18   Bonnielee Haff, MD  ferrous sulfate 325 (65 FE) MG tablet Take 1 tablet (325 mg total) by mouth 3 (three) times daily with meals. Patient not taking: Reported on 11/13/2018 08/29/18 11/13/26  Marylin Crosby, MD  FLOVENT HFA 44 MCG/ACT inhaler Inhale 2 puffs into the lungs 2 (two) times daily. Patient not taking: Reported on 12/08/2018 10/28/18   Bonnielee Haff, MD  furosemide (LASIX) 20 MG tablet Take 1 tablet (20 mg total) by mouth daily. Patient not taking: Reported on 11/13/2018 10/29/18   Bonnielee Haff, MD  gabapentin (NEURONTIN) 100 MG capsule Take 1 capsule (100 mg total) by mouth 2 (two) times daily. Patient not taking: Reported on 11/13/2018 10/28/18   Bonnielee Haff, MD  hydrOXYzine (ATARAX/VISTARIL) 50 MG tablet Take 1 tablet (50 mg total) by mouth 3 (three) times daily as needed for anxiety. Patient not taking: Reported on 11/13/2018 08/29/18   Marylin Crosby, MD  ipratropium-albuterol (DUONEB) 0.5-2.5 (3) MG/3ML SOLN Take 3 mLs by nebulization 3 (three) times daily. Patient not taking: Reported on 11/13/2018 10/28/18   Bonnielee Haff, MD  potassium chloride SA (K-DUR,KLOR-CON) 20 MEQ tablet Take 2 tablets (40 mEq total) by mouth at bedtime. Patient not taking: Reported on 12/08/2018 10/28/18   Bonnielee Haff, MD  QUEtiapine (SEROQUEL) 300 MG tablet Take 1 tablet (300 mg total) by mouth at bedtime. Patient not taking: Reported on 11/04/2018 08/29/18   Marylin Crosby, MD  sertraline (ZOLOFT) 100 MG tablet Take 1 tablet (100 mg total) by mouth daily. Patient not taking: Reported on 11/04/2018 08/30/18   Marylin Crosby, MD  traZODone (DESYREL) 50 MG tablet Take 1 tablet (50 mg total) by mouth at bedtime as needed for sleep. Patient not taking: Reported on 11/04/2018 08/29/18   Marylin Crosby, MD    Family History Family History  Problem Relation Age of Onset  . Diabetes Mother   . Hypertension Mother   . Drug abuse Father   .  Schizophrenia Maternal Aunt     Social History Social History   Tobacco Use  . Smoking status: Current Every Day Smoker    Packs/day: 1.00    Types: Cigarettes  . Smokeless tobacco: Never Used  . Tobacco comment: pt reported quiting three weeks ago  Substance Use Topics  . Alcohol use: Yes    Comment: pt denies drinking at this time  . Drug use: Yes    Frequency: 1.0 times per week    Types: Cocaine, Marijuana    Comment: last use 12/06/18     Allergies   Patient has no known allergies.   Review of Systems Review of Systems  Respiratory: Positive for cough, chest tightness, shortness of breath and wheezing.   Psychiatric/Behavioral: Positive for suicidal ideas.     Physical Exam Updated Vital Signs BP (!) 100/52   Pulse Marland Kitchen)  106   Temp 97.8 F (36.6 C) (Oral)   Resp 20   LMP 12/07/2018 (Exact Date)   SpO2 96%   Physical Exam Vitals signs and nursing note reviewed.  Constitutional:      General: She is not in acute distress.    Appearance: She is well-developed. She is not diaphoretic.     Comments: Awake, alert, nontoxic appearance  HENT:     Head: Normocephalic and atraumatic.     Mouth/Throat:     Pharynx: No oropharyngeal exudate.  Eyes:     General: No scleral icterus.    Conjunctiva/sclera: Conjunctivae normal.  Neck:     Musculoskeletal: Normal range of motion and neck supple.  Cardiovascular:     Rate and Rhythm: Regular rhythm. Tachycardia present.  Pulmonary:     Effort: Pulmonary effort is normal. Tachypnea present. No respiratory distress.     Breath sounds: Wheezing (throughout) present.  Abdominal:     General: Bowel sounds are normal.     Palpations: Abdomen is soft. There is no mass.     Tenderness: There is no abdominal tenderness. There is no guarding or rebound.  Musculoskeletal: Normal range of motion.  Skin:    General: Skin is warm and dry.  Neurological:     Mental Status: She is alert.     Comments: Speech is clear and goal  oriented Moves extremities without ataxia  Psychiatric:        Attention and Perception: She perceives visual hallucinations. She does not perceive auditory hallucinations.        Thought Content: Thought content includes suicidal ideation. Thought content does not include homicidal ideation. Thought content does not include homicidal or suicidal plan.      ED Treatments / Results  Labs (all labs ordered are listed, but only abnormal results are displayed) Labs Reviewed  COMPREHENSIVE METABOLIC PANEL - Abnormal; Notable for the following components:      Result Value   Potassium 3.1 (*)    CO2 21 (*)    Glucose, Bld 104 (*)    Creatinine, Ser 1.11 (*)    All other components within normal limits  ACETAMINOPHEN LEVEL - Abnormal; Notable for the following components:   Acetaminophen (Tylenol), Serum <10 (*)    All other components within normal limits  CBC - Abnormal; Notable for the following components:   Hemoglobin 11.9 (*)    RDW 17.6 (*)    All other components within normal limits  ETHANOL  SALICYLATE LEVEL  INFLUENZA PANEL BY PCR (TYPE A & B)  RAPID URINE DRUG SCREEN, HOSP PERFORMED  I-STAT BETA HCG BLOOD, ED ([facility], [facility], [facility] ONLY)    EKG EKG Interpretation  Date/Time:  Wednesday December 21 2018 01:46:00 EST Ventricular Rate:  108 PR Interval:    QRS Duration: 95 QT Interval:  352 QTC Calculation: 472 R Axis:   90 Text Interpretation:  Sinus tachycardia Borderline right axis deviation Otherwise within normal limits When compared with ECG of 10/24/2018, No significant change was found Confirmed by Delora Fuel ([id number]) on 12/21/2018 1:49:18 AM   Radiology Dg Chest 2 View  Result Date: 12/21/2018 CLINICAL DATA:  Subacute onset of cough and shortness of breath. EXAM: CHEST - 2 VIEW COMPARISON:  Chest radiograph performed 12/08/2018 FINDINGS: The lungs are well-aerated and clear. There is no evidence of focal opacification, pleural effusion or pneumothorax. An accessory  azygos lobe is incidentally noted. The heart is normal in size; the mediastinal contour is within normal limits. No  acute osseous abnormalities are seen. IMPRESSION: No acute cardiopulmonary process seen. Electronically Signed   By: Garald Balding M.D.   On: 12/21/2018 02:19    Procedures Procedures (including critical care time)  Medications Ordered in ED Medications  albuterol (PROVENTIL HFA;VENTOLIN HFA) 108 (90 Base) MCG/ACT inhaler 2 puff (has no administration in time range)  predniSONE (DELTASONE) tablet 40 mg (has no administration in time range)  hydrOXYzine (ATARAX/VISTARIL) tablet 50 mg (has no administration in time range)  gabapentin (NEURONTIN) capsule 100 mg (has no administration in time range)  furosemide (LASIX) tablet 20 mg (has no administration in time range)  QUEtiapine (SEROQUEL) tablet 300 mg (300 mg Oral Given 12/21/18 0522)  sertraline (ZOLOFT) tablet 100 mg (100 mg Oral Given 12/21/18 0523)  traZODone (DESYREL) tablet 50 mg (has no administration in time range)  albuterol (PROVENTIL) (2.5 MG/3ML) 0.083% nebulizer solution 5 mg (5 mg Nebulization Given 12/21/18 0237)  ipratropium (ATROVENT) nebulizer solution 0.5 mg (0.5 mg Nebulization Given 12/21/18 0237)  methylPREDNISolone sodium succinate (SOLU-MEDROL) 125 mg/2 mL injection 125 mg (125 mg Intravenous Given 12/21/18 0237)  sodium chloride 0.9 % bolus 1,000 mL (0 mLs Intravenous Stopped 12/21/18 0456)  potassium chloride SA (K-DUR,KLOR-CON) CR tablet 40 mEq (40 mEq Oral Given 12/21/18 0319)     Initial Impression / Assessment and Plan / ED Course  I have reviewed the triage vital signs and the nursing notes.  Pertinent labs & imaging results that were available during my care of the patient were reviewed by me and considered in my medical decision making (see chart for details).  Clinical Course as of Dec 21 601  Wed Dec 21, 2018  0305 baseline  Hemoglobin(!): 11.9 [HM]  0305 HR improving  Pulse Rate:  99 [HM]  0305 RR improved after 2nd nebulizer  Resp: 19 [HM]  0325 Clear and equal breath sounds after last albuterol treatment.  Patient reports she is breathing at baseline.   [HM]  [phone] Discussed with Beverely Low of TTS who reports pt meets inpatient criteria.     [HM]    Clinical Course User Index [HM] Valicia Rief, Jarrett S[place], [place]   Patient presents with multiple complaints.  Shortness of breath and wheezing.  She is out of her inhaler.  Patient given to albuterol treatments and steroids here in the emergency department.  She is now breathing without difficulty.  No wheezing.  No hypoxia.  Patient with tachycardia throughout her visit.  Suspect this is secondary to her cocaine usage earlier in the evening.  She has no chest pain.  Additionally, patient reports she is suicidal.  She is stressed about many external factors.  She did not make a plan today.  She was evaluated by TTS who recommends inpatient placement.  At this time there is no emergent condition for which patient requires intervention.  She may be transferred to psychiatric facility.   The patient was discussed with and seen by Dr. Roxanne Mins who agrees with the treatment plan.   Final Clinical Impressions(s) / ED Diagnoses   Final diagnoses:  Mild intermittent asthma with exacerbation  Suicidal ideations  Polysubstance abuse [facility])    ED Discharge Orders    None       Laurieanne Galloway, Gwenlyn Perking 93/79/02 [id number]    Delora Fuel, MD 35/32/99 [id number]    Delora Fuel, MD 83/41/96 [phone]

## 2018-12-22 ENCOUNTER — Inpatient Hospital Stay: Payer: Self-pay

## 2018-12-22 DIAGNOSIS — F1420 Cocaine dependence, uncomplicated: Secondary | ICD-10-CM

## 2018-12-22 LAB — BASIC METABOLIC PANEL
Anion gap: 8 (ref 5–15)
BUN: 12 mg/dL (ref 6–20)
CO2: 23 mmol/L (ref 22–32)
Calcium: 8.7 mg/dL — ABNORMAL LOW (ref 8.9–10.3)
Chloride: 112 mmol/L — ABNORMAL HIGH (ref 98–111)
Creatinine, Ser: 0.84 mg/dL (ref 0.44–1.00)
GFR calc Af Amer: >60 mL/min (ref >60)
GFR calc non Af Amer: >60 mL/min (ref >60)
GLUCOSE: 96 mg/dL (ref 70–99)
Potassium: 3.7 mmol/L (ref 3.5–5.1)
Sodium: 143 mmol/L (ref 135–145)

## 2018-12-22 MED ORDER — FLUOXETINE HCL 20 MG PO CAPS
40.0000 mg | ORAL_CAPSULE | Freq: Every day | ORAL | Status: DC
  Administered 2018-12-22 – 2018-12-25 (×4): 40 mg via ORAL
  Filled 2018-12-22: qty 2
  Filled 2018-12-22: qty 14
  Filled 2018-12-22 (×5): qty 2

## 2018-12-22 MED ORDER — ACETAMINOPHEN 325 MG PO TABS
650.0000 mg | ORAL_TABLET | Freq: Four times a day (QID) | ORAL | Status: DC | PRN
  Administered 2018-12-22 – 2018-12-24 (×3): 650 mg via ORAL
  Filled 2018-12-22 (×3): qty 2

## 2018-12-22 NOTE — Progress Notes (Signed)
Did not attend group 

## 2018-12-22 NOTE — Progress Notes (Signed)
[facility] MD Progress Note  12/22/2018 7:38 AM Deborah Melendez Subjective:    Hospital day #2 for Deborah Melendez reports suicidal thinking states she is has some stress in her relationship with her boyfriend [place], she has been abusing alcohol and crack cocaine and cannabis and states she has trouble staying away from drugs. She does not provide a great deal more information than she already did in the admission note States she had thoughts of overdosing but does not have those thoughts now and can contract here. Denies family psychiatric history  Principal Problem: Bipolar I disorder, most recent episode depressed ([place]) Diagnosis: Principal Problem:   Bipolar I disorder, most recent episode depressed ([place]) Active Problems:   Cocaine use disorder, severe, dependence ([place])   Suicidal ideation   Tobacco use disorder   Asthma, chronic  Total Time spent with patient: 20 minutes   Past Medical History:  Past Medical History:  Diagnosis Date  . Asthma   . Bipolar 1 disorder, depressed, severe ([place]) 02/04/2017  . Cocaine abuse with cocaine-induced mood disorder ([place]) 07/27/2017  . COPD (chronic obstructive pulmonary disease) ([place])   . Depression   . Homicidal ideations   . Manic behavior ([place])   . 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 Suicidal ideation     Past Surgical History:  Procedure Laterality Date  . FINGER SURGERY     Family History:  Family History  Problem Relation Age of Onset  . Diabetes Mother   . Hypertension Mother   . Drug abuse Father   . Schizophrenia Maternal Aunt    Family Psychiatric  History: neg Social History:  Social History   Substance and Sexual Activity  Alcohol Use Yes   Comment: pt denies drinking at this time     Social History   Substance and Sexual Activity   Drug Use Yes  . Frequency: 1.0 times per week  . Types: Cocaine, Marijuana   Comment: last use 12/06/18    Social History   Socioeconomic History  . Marital status: Single    Spouse name: Not on file  . Number of children: Not on file  . Years of education: Not on file  . Highest education level: Not on file  Occupational History  . Not on file  Social Needs  . Financial resource strain: Not on file  . Food insecurity:    Worry: Not on file    Inability: Not on file  . Transportation needs:    Medical: Not on file    Non-medical: Not on file  Tobacco Use  . Smoking status: Current Every Day Smoker    Packs/day: 1.00    Types: Cigarettes  . Smokeless tobacco: Never Used  . Tobacco comment: pt reported quiting three weeks ago  Substance and Sexual Activity  . Alcohol use: Yes    Comment: pt denies drinking at this time  . Drug use: Yes    Frequency: 1.0 times per week    Types: Cocaine, Marijuana    Comment: last use 12/06/18  . Sexual activity: Yes    Birth control/protection: None  Lifestyle  . Physical activity:    Days per week: Not on file    Minutes per session: Not on file  . Stress: Not on file  Relationships  . Social connections:    Talks on phone: Not on  file    Gets together: Not on file    Attends religious service: Not on file    Active member of club or organization: Not on file    Attends meetings of clubs or organizations: Not on file    Relationship status: Not on file  Other Topics Concern  . Not on file  Social History Narrative  . Not on file   Additional Social History:  States she would like some rehab but plans to live with her boyfriend again Sleep: Fair  Appetite:  Fair  Current Medications: Current Facility-Administered Medications  Medication Dose Route Frequency Provider Last Rate Last Dose  . albuterol (PROVENTIL HFA;VENTOLIN HFA) 108 (90 Base) MCG/ACT inhaler 1-2 puff  1-2 puff Inhalation Q6H PRN Withrow, John C, FNP   2  puff at 12/21/18 2220  . albuterol (PROVENTIL) (2.5 MG/3ML) 0.083% nebulizer solution 2.5 mg  2.5 mg Nebulization Q6H PRN Withrow, Elyse Jarvis, FNP      . FLUoxetine (PROZAC) capsule 40 mg  40 mg Oral Daily Johnn Hai, MD      . nicotine (NICODERM CQ - dosed in mg/24 hours) patch 21 mg  21 mg Transdermal Daily Withrow, Elyse Jarvis, FNP      . potassium chloride SA (K-DUR,KLOR-CON) CR tablet 20 mEq  20 mEq Oral BID Cobos, Myer Peer, MD   20 mEq at 12/21/18 1728  . QUEtiapine (SEROQUEL) tablet 200 mg  200 mg Oral QHS Benjamine Mola, FNP   200 mg at 12/21/18 2221  . traZODone (DESYREL) tablet 100 mg  100 mg Oral QHS PRN Benjamine Mola, FNP   100 mg at 12/21/18 2221    Lab Results:  Results for orders placed or performed during the hospital encounter of 12/21/18 (from the past 48 hour(s))  Basic metabolic panel     Status: Abnormal   Collection Time: 12/22/18  6:27 AM  Result Value Ref Range   Sodium 143 135 - 145 mmol/L   Potassium 3.7 3.5 - 5.1 mmol/L   Chloride 112 (H) 98 - 111 mmol/L   CO2 23 22 - 32 mmol/L   Glucose, Bld 96 70 - 99 mg/dL   BUN 12 6 - 20 mg/dL   Creatinine, Ser 0.84 0.44 - 1.00 mg/dL   Calcium 8.7 (L) 8.9 - 10.3 mg/dL   GFR calc non Af Amer >60 >60 mL/min   GFR calc Af Amer >60 >60 mL/min   Anion gap 8 5 - 15    Comment: Performed at [facility], [address] [place], [place] [address]    Blood Alcohol level:  Lab Results  Component Value Date   ETH <10 12/21/2018   ETH <10 16/60/6301    Metabolic Disorder Labs: Lab Results  Component Value Date   HGBA1C 5.3 08/25/2018   MPG 105.41 08/25/2018   MPG 114.02 10/09/2017   No results found for: PROLACTIN Lab Results  Component Value Date   CHOL 148 08/25/2018   TRIG 99 10/21/2018   HDL 52 08/25/2018   CHOLHDL 2.8 08/25/2018   VLDL 13 08/25/2018   LDLCALC 83 08/25/2018   LDLCALC 69 10/09/2017    Physical Findings: AIMS: Facial and Oral Movements Muscles of Facial Expression: None,  normal Lips and Perioral Area: None, normal Jaw: None, normal Tongue: None, normal,Extremity Movements Upper (arms, wrists, hands, fingers): None, normal Lower (legs, knees, ankles, toes): None, normal, Trunk Movements Neck, shoulders, hips: None, normal, Overall Severity Severity of abnormal movements (highest score from  questions above): None, normal Incapacitation due to abnormal movements: None, normal Patient's awareness of abnormal movements (rate only patient's report): No Awareness, Dental Status Current problems with teeth and/or dentures?: No Does patient usually wear dentures?: No  CIWA:    COWS:     Musculoskeletal: Strength & Muscle Tone: within normal limits Gait & Station: normal  Psychiatric Specialty Exam: Physical Exam no obvious withdrawal symptoms  ROS no seizures  Blood pressure 112/80, pulse (!) 101, temperature 97.8 F (36.6 C), temperature source Oral, resp. rate 18, height 5\' 4"  (1.626 m), weight 75 kg, last menstrual period 12/07/2018, SpO2 100 %.Body mass index is 28.38 kg/m.  General Appearance: Casual  Eye Contact:  Fair  Speech:  Slow  Volume:  Decreased  Mood:  Anxious, Depressed and Dysphoric  Affect:  Constricted  Thought Process:  Goal Directed  Orientation:  Full (Time, Place, and Person)  Thought Content:  Tangential  Suicidal Thoughts:  Intermittent w/o intent   Homicidal Thoughts:  No  Memory:  Immediate;   Good  Judgement:  Good  Insight:  Good  Psychomotor Activity:  Normal  Concentration:  Concentration: Good  Recall:  Good  Fund of Knowledge:  Good  Language:  Good  Akathisia:  Negative  Handed:  Right  AIMS (if indicated):     Assets: overall healthy  ADL's:  Intact  Cognition:  WNL  Sleep:  Number of Hours: 6.75   Continue rehab and cognitive based therapies escalate fluoxetine for depression monitor for withdrawal address cravings if needed  Treatment Plan Summary: Daily contact with patient to assess and evaluate  symptoms and progress in treatment and Medication management  Toy Samarin, MD 12/22/2018, 7:38 AM

## 2018-12-22 NOTE — Progress Notes (Signed)
Recreation Therapy Notes  Date: 12.26.19 Time: 1000 Location: 500 Hall Dayroom  Group Topic: Triggers  Goal Area(s) Addresses:  Patient will identify their triggers. Patient will identify ways to avoid triggers. Patient will identify ways to face triggers head on.  Intervention: Worksheet  Activity: Triggers.  Patients were to identify the problem their triggers contribute to.  Patients were to also identify their biggest triggers, ways to avoid/reduce exposure to their triggers and ways to deal with triggers head on.  Education: Communication, Discharge Planning  Education Outcome: Acknowledges understanding/In group clarification offered/Needs additional education.   Clinical Observations/Feedback: Pt did not attend group.    Victorino Sparrow, LRT/CTRS         Victorino Sparrow A 12/22/2018 11:45 AM

## 2018-12-22 NOTE — [facility] Suicide Risk Assessment (Signed)
[facility] INPATIENT:  Family/Significant Other Suicide Prevention Education  Suicide Prevention Education:  Education Completed; Deborah Melendez, friend, [phone], has been identified by the patient as the family member/significant other with whom the patient will be residing, and identified as the person(s) who will aid the patient in the event of a mental health crisis (suicidal ideations/suicide attempt).  With written consent from the patient, the family member/significant other has been provided the following suicide prevention education, prior to the and/or following the discharge of the patient.  The suicide prevention education provided includes the following:  Suicide risk factors  Suicide prevention and interventions  National Suicide Hotline telephone number  [name] assessment telephone number  [name] Emergency Assistance [name] and/or Residential Mobile Crisis Unit telephone number  Request made of family/significant other to:  Remove weapons (e.g., guns, rifles, knives), all items previously/currently identified as safety concern.    Remove drugs/medications (over-the-counter, prescriptions, illicit drugs), all items previously/currently identified as a safety concern.  The family member/significant other verbalizes understanding of the suicide prevention education information provided.  The family member/significant other agrees to remove the items of safety concern listed above.  Deborah Melendez reports he speaks to pt almost daily and will continue to be a support to her.  Pt family is not supportive and pt boyfriend is having to leave the country.   [name], LCSW 12/22/2018, 3:41 PM
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
## 2018-12-22 NOTE — [facility] Counselor (Signed)
CSW attempted to meet with pt to complete PSA and pt refused, said she did not want to answer any questions right now.   Winferd Humphrey, MSW, LCSW Clinical Social Worker 12/22/2018 11:17 AM

## 2018-12-22 NOTE — Progress Notes (Signed)
Pt observed in the room, seen resting in bed with eyes open. Pt appears flat in affect and mood. Pt denies SI/HI/AVH at this time. Rates pain 8/10; abdomen. Heat pack provided. No new c/o's. Pt is isolative; coming out for meals/snacks. Pt did not attend group. Support and encouragement provided. Will continue with POC.

## 2018-12-22 NOTE — Progress Notes (Signed)
Pt denies SI, HI and AVH.  Pt is med compliant.  Pt does not attend group and stays in her room.  Pt denies any pain or discomfort.  Pt uses inhaler, is cooperative and answers questions.  Pt presents with a flat affect during assessment Pt stays in bed and is sleeping.

## 2018-12-22 NOTE — Progress Notes (Signed)
Patient ID: Deborah Melendez, female   DOB: [birth year], 42 y.o.   MRN: [id number]  Nursing Progress Note [id number]  Data: On initial approach, patient remains in bed and is resistant to getting up. Patient presents flat and minimal. Patient compliant with scheduled medications. Patient denies pain/physical complaints. Patient provided but has not yet completed their self-inventory sheet. Patient is isolative to her room but does get up for meals. Patient currently denies SI/HI/AVH.   Action: Patient is educated about and provided medication per provider's orders. Patient safety maintained with q15 min safety checks and frequent rounding. Low fall risk precautions in place. Emotional support given. 1:1 interaction and active listening provided. Patient encouraged to attend meals, groups, and work on treatment plan and goals. Labs, vital signs and patient behavior monitored throughout shift.   Response: Patient remains safe on the unit at this time and agrees to come to staff with any issues/concerns. Patient is interacting with peers appropriately on the unit. Will continue to support and monitor.

## 2018-12-23 NOTE — Progress Notes (Signed)
Recreation Therapy Notes  Date: 12.27.19 Time: 1000 Location: 500 Hall Dayroom  Group Topic: Communication, Team Building, Problem Solving  Goal Area(s) Addresses:  Patient will effectively work with peer towards shared goal.  Patient will identify skill used to make activity successful.  Patient will identify how skills used during activity can be used to reach post d/c goals.   Intervention: STEM Activity   Activity: [employer]. In teams, patients were asked to build the tallest freestanding tower possible out of 15 pipe cleaners. Systematically resources were removed, for example patient ability to use both hands and patient ability to verbally communicate.    Education: Education officer, community, Dentist.   Education Outcome: Acknowledges education/In group clarification offered/Needs additional education.   Clinical Observations/Feedback: Pt did not attend group.    Victorino Sparrow, LRT/CTRS         Ria Comment, Lera Gaines A 12/23/2018 11:30 AM

## 2018-12-23 NOTE — Progress Notes (Signed)
[facility] MD Progress Note  12/23/2018 9:57 AM Deborah Melendez  MRN:  [id number] Subjective:   Patient staying in bed and not participating she still has nowhere to go and has not been on the phones looking for a place she is hoping to get rehab but is not doing any of the work herself.  Denies wanting to harm self or others contracting here contracting if she leaves but is focusing on other people doing the work of finding her housing and rehab  Principal Problem: Bipolar I disorder, most recent episode depressed ([place]) Diagnosis: Principal Problem:   Bipolar I disorder, most recent episode depressed ([place]) Active Problems:   Cocaine use disorder, severe, dependence ([place])   Suicidal ideation   Tobacco use disorder   Asthma, chronic  Total Time spent with patient: 20 minutes   Past Medical History:  Past Medical History:  Diagnosis Date  . Asthma   . Bipolar 1 disorder, depressed, severe ([place]) 02/04/2017  . Cocaine abuse with cocaine-induced mood disorder ([place]) 07/27/2017  . COPD (chronic obstructive pulmonary disease) ([place])   . Depression   . Homicidal ideations   . Manic behavior ([place])   . 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 Substance or medication-induced bipolar and related disorder with onset during intoxication ([place]) 09/08/2016  . Suicidal ideation     Past Surgical History:  Procedure Laterality Date  . FINGER SURGERY     Family History:  Family History  Problem Relation Age of Onset  . Diabetes Mother   . Hypertension Mother   . Drug abuse Father   . Schizophrenia Maternal Aunt     Social History:  Social History   Substance and Sexual Activity  Alcohol Use Yes   Comment: pt denies drinking at this time     Social History   Substance and Sexual Activity  Drug Use Yes  . Frequency: 1.0 times per week  . Types: Cocaine, Marijuana   Comment: last use 12/06/18    Social History   Socioeconomic History   . Marital status: Single    Spouse name: Not on file  . Number of children: Not on file  . Years of education: Not on file  . Highest education level: Not on file  Occupational History  . Not on file  Social Needs  . Financial resource strain: Not on file  . Food insecurity:    Worry: Not on file    Inability: Not on file  . Transportation needs:    Medical: Not on file    Non-medical: Not on file  Tobacco Use  . Smoking status: Current Every Day Smoker    Packs/day: 1.00    Types: Cigarettes  . Smokeless tobacco: Never Used  . Tobacco comment: pt reported quiting three weeks ago  Substance and Sexual Activity  . Alcohol use: Yes    Comment: pt denies drinking at this time  . Drug use: Yes    Frequency: 1.0 times per week    Types: Cocaine, Marijuana    Comment: last use 12/06/18  . Sexual activity: Yes    Birth control/protection: None  Lifestyle  . Physical activity:    Days per week: Not on file    Minutes per session: Not on file  . Stress: Not on file  Relationships  . Social connections:    Talks on phone: Not on file    Gets together: Not on file    Attends religious service: Not  on file    Active member of club or organization: Not on file    Attends meetings of clubs or organizations: Not on file    Relationship status: Not on file  Other Topics Concern  . Not on file  Social History Narrative  . Not on file   Additional Social History:                         Sleep: Good  Appetite:  Good  Current Medications: Current Facility-Administered Medications  Medication Dose Route Frequency Provider Last Rate Last Dose  . acetaminophen (TYLENOL) tablet 650 mg  650 mg Oral Q6H PRN Sunjai, [place], PA-C   650 mg at 12/22/18 2121  . albuterol (PROVENTIL HFA;VENTOLIN HFA) 108 (90 Base) MCG/ACT inhaler 1-2 puff  1-2 puff Inhalation Q6H PRN Withrow, John C, FNP   2 puff at 12/22/18 1420  . albuterol (PROVENTIL) (2.5 MG/3ML) 0.083% nebulizer  solution 2.5 mg  2.5 mg Nebulization Q6H PRN Benjamine Mola, FNP   2.5 mg at 12/22/18 1420  . FLUoxetine (PROZAC) capsule 40 mg  40 mg Oral Daily Johnn Hai, MD   40 mg at 12/22/18 1214  . nicotine (NICODERM CQ - dosed in mg/24 hours) patch 21 mg  21 mg Transdermal Daily Withrow, Elyse Jarvis, FNP      . QUEtiapine (SEROQUEL) tablet 200 mg  200 mg Oral QHS Benjamine Mola, FNP   200 mg at 12/22/18 2117  . traZODone (DESYREL) tablet 100 mg  100 mg Oral QHS PRN Benjamine Mola, FNP   100 mg at 12/22/18 2117    Lab Results:  Results for orders placed or performed during the hospital encounter of 12/21/18 (from the past 48 hour(s))  Basic metabolic panel     Status: Abnormal   Collection Time: 12/22/18  6:27 AM  Result Value Ref Range   Sodium 143 135 - 145 mmol/L   Potassium 3.7 3.5 - 5.1 mmol/L   Chloride 112 (H) 98 - 111 mmol/L   CO2 23 22 - 32 mmol/L   Glucose, Bld 96 70 - 99 mg/dL   BUN 12 6 - 20 mg/dL   Creatinine, Ser 0.84 0.44 - 1.00 mg/dL   Calcium 8.7 (L) 8.9 - 10.3 mg/dL   GFR calc non Af Amer >60 >60 mL/min   GFR calc Af Amer >60 >60 mL/min   Anion gap 8 5 - 15    Comment: Performed at [facility], [place] [address], [place], [place] [address]    Blood Alcohol level:  Lab Results  Component Value Date   ETH <10 12/21/2018   ETH <10 34/19/6222    Metabolic Disorder Labs: Lab Results  Component Value Date   HGBA1C 5.3 08/25/2018   MPG 105.41 08/25/2018   MPG 114.02 10/09/2017   No results found for: PROLACTIN Lab Results  Component Value Date   CHOL 148 08/25/2018   TRIG 99 10/21/2018   HDL 52 08/25/2018   CHOLHDL 2.8 08/25/2018   VLDL 13 08/25/2018   LDLCALC 83 08/25/2018   LDLCALC 69 10/09/2017    Physical Findings: AIMS: Facial and Oral Movements Muscles of Facial Expression: None, normal Lips and Perioral Area: None, normal Jaw: None, normal Tongue: None, normal,Extremity Movements Upper (arms, wrists, hands, fingers): None,  normal Lower (legs, knees, ankles, toes): None, normal, Trunk Movements Neck, shoulders, hips: None, normal, Overall Severity Severity of abnormal movements (highest score from questions above): None,  normal Incapacitation due to abnormal movements: None, normal Patient's awareness of abnormal movements (rate only patient's report): No Awareness, Dental Status Current problems with teeth and/or dentures?: No Does patient usually wear dentures?: No  CIWA:    COWS:     Musculoskeletal: Strength & Muscle Tone: within normal limits Gait & Station: normal Psychiatric Specialty Exam: Physical Exam  ROS  Blood pressure 112/80, pulse (!) 101, temperature 97.8 F (36.6 C), temperature source Oral, resp. rate 18, height 5\' 4"  (1.626 m), weight 75 kg, last menstrual period 12/07/2018, SpO2 100 %.Body mass index is 28.38 kg/m.  General Appearance: Casual  Eye Contact:  Good  Speech:  Slow and Slurred  Volume:  Decreased  Mood:  Dysphoric  Affect:  Blunt  Thought Process:  Goal Directed  Orientation:  Full (Time, Place, and Person)  Thought Content:  Logical  Suicidal Thoughts:  No  Homicidal Thoughts:  No  Memory:  Immediate;   Good  Judgement:  Good  Insight:  Good  Psychomotor Activity:  Decreased  Concentration:  Concentration: Fair  Recall:  [place] of Knowledge:  Good  Language:  Good  Akathisia:  Negative  Handed:  Right  AIMS (if indicated):     Assets:  Resilience  ADL's:  Intact  Cognition:  WNL  Sleep:  Number of Hours: 6.75     Treatment Plan Summary: Daily contact with patient to assess and evaluate symptoms and progress in treatment, Medication management and Plan Again her main issue now is housing and seeking rehab she is encouraged to do this for herself and not rely on others, continue cognitive and reality based therapies continue to monitor for withdrawal may discharge in 1 to 2 days  Johnn Hai, MD 12/23/2018, 9:57 AM

## 2018-12-23 NOTE — Tx Team (Signed)
Interdisciplinary Treatment and Diagnostic Plan Update  12/23/2018 Time of Session: 9:00am Deborah Melendez MRN: [id number]  Principal Diagnosis: Bipolar I disorder, most recent episode depressed ([place])  Secondary Diagnoses: Principal Problem:   Bipolar I disorder, most recent episode depressed ([place]) Active Problems:   Cocaine use disorder, severe, dependence ([place])   Suicidal ideation   Tobacco use disorder   Asthma, chronic   Current Medications:  Current Facility-Administered Medications  Medication Dose Route Frequency Provider Last Rate Last Dose  . acetaminophen (TYLENOL) tablet 650 mg  650 mg Oral Q6H PRN Tamiko, Leopard, PA-C   650 mg at 12/22/18 2121  . albuterol (PROVENTIL HFA;VENTOLIN HFA) 108 (90 Base) MCG/ACT inhaler 1-2 puff  1-2 puff Inhalation Q6H PRN Withrow, John C, FNP   2 puff at 12/22/18 1420  . albuterol (PROVENTIL) (2.5 MG/3ML) 0.083% nebulizer solution 2.5 mg  2.5 mg Nebulization Q6H PRN Benjamine Mola, FNP   2.5 mg at 12/22/18 1420  . FLUoxetine (PROZAC) capsule 40 mg  40 mg Oral Daily Johnn Hai, MD   40 mg at 12/22/18 1214  . nicotine (NICODERM CQ - dosed in mg/24 hours) patch 21 mg  21 mg Transdermal Daily Withrow, Elyse Jarvis, FNP      . QUEtiapine (SEROQUEL) tablet 200 mg  200 mg Oral QHS Benjamine Mola, FNP   200 mg at 12/22/18 2117  . traZODone (DESYREL) tablet 100 mg  100 mg Oral QHS PRN Benjamine Mola, FNP   100 mg at 12/22/18 2117   PTA Medications: No medications prior to admission.    Patient Stressors: Medication change or noncompliance Substance abuse  Patient Strengths: Ability for insight General fund of knowledge Physical Health  Treatment Modalities: Medication Management, Group therapy, Case management,  1 to 1 session with clinician, Psychoeducation, Recreational therapy.   Physician Treatment Plan for Primary Diagnosis: Bipolar I disorder, most recent episode depressed ([place]) Long Term Goal(s): Improvement in symptoms so  as ready for discharge Improvement in symptoms so as ready for discharge   Short Term Goals: Ability to identify changes in lifestyle to reduce recurrence of condition will improve Ability to verbalize feelings will improve Ability to disclose and discuss suicidal ideas Ability to demonstrate self-control will improve Ability to identify and develop effective coping behaviors will improve Ability to maintain clinical measurements within normal limits will improve Compliance with prescribed medications will improve Ability to identify triggers associated with substance abuse/mental health issues will improve Ability to identify changes in lifestyle to reduce recurrence of condition will improve Ability to verbalize feelings will improve Ability to disclose and discuss suicidal ideas Ability to demonstrate self-control will improve Ability to identify and develop effective coping behaviors will improve Ability to maintain clinical measurements within normal limits will improve Compliance with prescribed medications will improve Ability to identify triggers associated with substance abuse/mental health issues will improve  Medication Management: Evaluate patient's response, side effects, and tolerance of medication regimen.  Therapeutic Interventions: 1 to 1 sessions, Unit Group sessions and Medication administration.  Evaluation of Outcomes: Progressing  Physician Treatment Plan for Secondary Diagnosis: Principal Problem:   Bipolar I disorder, most recent episode depressed ([place]) Active Problems:   Cocaine use disorder, severe, dependence ([place])   Suicidal ideation   Tobacco use disorder   Asthma, chronic  Long Term Goal(s): Improvement in symptoms so as ready for discharge Improvement in symptoms so as ready for discharge   Short Term Goals: Ability to identify changes in lifestyle to reduce recurrence  of condition will improve Ability to verbalize feelings will improve Ability to  disclose and discuss suicidal ideas Ability to demonstrate self-control will improve Ability to identify and develop effective coping behaviors will improve Ability to maintain clinical measurements within normal limits will improve Compliance with prescribed medications will improve Ability to identify triggers associated with substance abuse/mental health issues will improve Ability to identify changes in lifestyle to reduce recurrence of condition will improve Ability to verbalize feelings will improve Ability to disclose and discuss suicidal ideas Ability to demonstrate self-control will improve Ability to identify and develop effective coping behaviors will improve Ability to maintain clinical measurements within normal limits will improve Compliance with prescribed medications will improve Ability to identify triggers associated with substance abuse/mental health issues will improve     Medication Management: Evaluate patient's response, side effects, and tolerance of medication regimen.  Therapeutic Interventions: 1 to 1 sessions, Unit Group sessions and Medication administration.  Evaluation of Outcomes: Progressing   RN Treatment Plan for Primary Diagnosis: Bipolar I disorder, most recent episode depressed ([place]) Long Term Goal(s): Knowledge of disease and therapeutic regimen to maintain health will improve  Short Term Goals: Ability to remain free from injury will improve, Ability to verbalize frustration and anger appropriately will improve, Ability to demonstrate self-control, Ability to verbalize feelings will improve, Ability to disclose and discuss suicidal ideas and Compliance with prescribed medications will improve  Medication Management: RN will administer medications as ordered by provider, will assess and evaluate patient's response and provide education to patient for prescribed medication. RN will report any adverse and/or side effects to prescribing  provider.  Therapeutic Interventions: 1 on 1 counseling sessions, Psychoeducation, Medication administration, Evaluate responses to treatment, Monitor vital signs and CBGs as ordered, Perform/monitor CIWA, COWS, AIMS and Fall Risk screenings as ordered, Perform wound care treatments as ordered.  Evaluation of Outcomes: Progressing   LCSW Treatment Plan for Primary Diagnosis: Bipolar I disorder, most recent episode depressed ([place]) Long Term Goal(s): Safe transition to appropriate next level of care at discharge, Engage patient in therapeutic group addressing interpersonal concerns.  Short Term Goals: Engage patient in aftercare planning with referrals and resources, Increase social support, Increase ability to appropriately verbalize feelings, Increase emotional regulation and Increase skills for wellness and recovery  Therapeutic Interventions: Assess for all discharge needs, 1 to 1 time with Social worker, Explore available resources and support systems, Assess for adequacy in community support network, Educate family and significant other(s) on suicide prevention, Complete Psychosocial Assessment, Interpersonal group therapy.  Evaluation of Outcomes: Progressing   Progress in Treatment: Attending groups: No. Participating in groups: No. Taking medication as prescribed: Yes. Toleration medication: Yes. Family/Significant other contact made: No, will contact:  friend, Jeneen Rinks Patient understands diagnosis: Yes. Discussing patient identified problems/goals with staff: Yes. Medical problems stabilized or resolved: No. Denies suicidal/homicidal ideation: Yes. Issues/concerns per patient self-inventory: Yes. Other:  Patient hoping to discharge over the weekend.   New problem(s) identified: No, Describe:  none  New Short Term/Long Term Goal(s): medication management for mood stabilization; elimination of SI thoughts; development of comprehensive mental wellness/sobriety plan.  Patient  Goals:  "Get my medicine back right"  Discharge Plan or Barriers: Patient hoping to discharge over weekend, states she can stay with a friend then. [place] pamphlet, Mobile Crisis information, and AA/NA information provided to patient for additional community support and resources.   Reason for Continuation of Hospitalization: Anxiety Depression Medication stabilization  Estimated Length of Stay: 3 days  Attendees: Patient: Raivyn  Mikhail 12/23/2018 8:35 AM  Physician: Dr.Farah 12/23/2018 8:35 AM  Nursing: Legrand Como RN 12/23/2018 8:35 AM  RN Care Manager: 12/23/2018 8:35 AM  Social Worker: Stephanie Acre, [place] 12/23/2018 8:35 AM  Recreational Therapist:  12/23/2018 8:35 AM  Other:  12/23/2018 8:35 AM  Other:  12/23/2018 8:35 AM  Other: 12/23/2018 8:35 AM    Scribe for Treatment Team: Joellen Jersey, [place] 12/23/2018 8:35 AM

## 2018-12-23 NOTE — Progress Notes (Signed)
Recreation Therapy Notes  INPATIENT RECREATION THERAPY ASSESSMENT  Patient Details Name: Deborah Melendez MRN: [id number] DOB: 1976/02/09 Today's Date: 12/23/2018       Information Obtained From: Chart Review  Able to Participate in Assessment/Interview:    Patient Presentation:    Reason for Admission (Per Patient): Suicidal Ideation, Other (Comments)(Depression)  Patient Stressors: Relationship, Death, Other (Comment)(Per chart; get medications straight, financial, father died in 2023-07-21)  Coping Skills:   (None identified)  Leisure Interests (2+):  (None identified)  Frequency of Recreation/Participation:    Awareness of Community Resources:     [employer]:     Current Use:    If no, Barriers?:    Expressed Interest in [place] of Residence:  [place]  Patient Main Form of Transportation:    Patient Strengths:  None identified  Patient Identified Areas of Improvement:  None identified  Patient Goal for Hospitalization:  Per chart; get medications straight; speak with counselor  Current SI (including self-harm):     Current HI:     Current AVH:    Staff Intervention Plan: Group Attendance, Collaborate with Interdisciplinary Treatment Team  Consent to Intern Participation: N/A   Victorino Sparrow, LRT/CTRS  Victorino Sparrow A 12/23/2018, 12:35 PM

## 2018-12-23 NOTE — Plan of Care (Signed)
  Problem: Activity: Goal: Interest or engagement in activities will improve Outcome: Not Progressing   Problem: Safety: Goal: Periods of time without injury will increase Outcome: Progressing  [place] Note: Patient presents with flat affect and depressed mood.  Denies suicidal thoughts but reports auditory and visual hallucinations.  Medications given as prescribed.  Routine safety checks maintained every 15 minutes.  Patient is withdrawn and isolates to her room most of this shift.  Minimal interaction with staff and peers.  Support and encouragement offered as needed.  Patient is safe on and off the unit.

## 2018-12-24 DIAGNOSIS — F3130 Bipolar disorder, current episode depressed, mild or moderate severity, unspecified: Principal | ICD-10-CM

## 2018-12-24 NOTE — Progress Notes (Signed)
Pt was observed sitting in the dayroom watching TV at the beginning of the shift.  When writer approached pt to do the evening assessment, pt reported that the Tylenol and albuterol that she was given before shift change had not been effective.  Writer noted that pt was not having any respiratory distress.  She then said she was ok.  Pt wanted her night meds at that time which was before 2000.  Pt was informed that she needed to try to avoid taking the meds before 2100 so that she would be able to sleep through the night.  Pt voiced understanding, and was given her meds right at 2100.  Support and encouragement offered.  Pt was encouraged to make her needs known to staff.  Discharge plans are in process.  CSW is still looking for placement for the pt.   Safety maintained with q15 minute checks.

## 2018-12-24 NOTE — Progress Notes (Addendum)
[facility] MD Progress Note  12/24/2018 10:06 AM Deborah Melendez  MRN:  [id number]   Subjective: Patient reports today that she is feeling much better.  She states that she slept very well last night and has had a good appetite.  Patient denies any suicidal homicidal ideations and did not denies any hallucinations.  Patient reports that she is making phone calls today to have a place to go and stay.  She denies any medication side effects and she also feels that she is almost ready to go to smoke to try to ensure that she has a place to stay.  She reports that she has 2 people that she will be calling around lunchtime today to get arrangements made for her to discharge tomorrow.  Objective: Patient's chart and findings reviewed and discussed with treatment team.  Patient presents today lying in her bed but is awake.  Patient is pleasant, calm, and cooperative.  Patient states that she has been attending groups and has been interacting with peers and staff appropriately and there have been no complaints about this patient on the unit.  Principal Problem: Bipolar I disorder, most recent episode depressed ([place]) Diagnosis: Principal Problem:   Bipolar I disorder, most recent episode depressed ([place]) Active Problems:   Cocaine use disorder, severe, dependence ([place])   Suicidal ideation   Tobacco use disorder   Asthma, chronic  Total Time spent with patient: 20 minutes   Past Medical History:  Past Medical History:  Diagnosis Date  . Asthma   . Bipolar 1 disorder, depressed, severe ([place]) 02/04/2017  . Cocaine abuse with cocaine-induced mood disorder ([place]) 07/27/2017  . COPD (chronic obstructive pulmonary disease) ([place])   . Depression   . Homicidal ideations   . Manic behavior ([place])   . MDD (major depressive disorder), recurrent severe, without psychosis ([place]) 08/08/2015  . 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 Suicidal  ideation     Past Surgical History:  Procedure Laterality Date  . FINGER SURGERY     Family History:  Family History  Problem Relation Age of Onset  . Diabetes Mother   . Hypertension Mother   . Drug abuse Father   . Schizophrenia Maternal Aunt     Social History:  Social History   Substance and Sexual Activity  Alcohol Use Yes   Comment: pt denies drinking at this time     Social History   Substance and Sexual Activity  Drug Use Yes  . Frequency: 1.0 times per week  . Types: Cocaine, Marijuana   Comment: last use 12/06/18    Social History   Socioeconomic History  . Marital status: Single    Spouse name: Not on file  . Number of children: Not on file  . Years of education: Not on file  . Highest education level: Not on file  Occupational History  . Not on file  Social Needs  . Financial resource strain: Not on file  . Food insecurity:    Worry: Not on file    Inability: Not on file  . Transportation needs:    Medical: Not on file    Non-medical: Not on file  Tobacco Use  . Smoking status: Current Every Day Smoker    Packs/day: 1.00    Types: Cigarettes  . Smokeless tobacco: Never Used  . Tobacco comment: pt reported quiting three weeks ago  Substance and Sexual Activity  . Alcohol use: Yes  Comment: pt denies drinking at this time  . Drug use: Yes    Frequency: 1.0 times per week    Types: Cocaine, Marijuana    Comment: last use 12/06/18  . Sexual activity: Yes    Birth control/protection: None  Lifestyle  . Physical activity:    Days per week: Not on file    Minutes per session: Not on file  . Stress: Not on file  Relationships  . Social connections:    Talks on phone: Not on file    Gets together: Not on file    Attends religious service: Not on file    Active member of club or organization: Not on file    Attends meetings of clubs or organizations: Not on file    Relationship status: Not on file  Other Topics Concern  . Not on file   Social History Narrative  . Not on file   Additional Social History:                         Sleep: Good  Appetite:  Good  Current Medications: Current Facility-Administered Medications  Medication Dose Route Frequency Provider Last Rate Last Dose  . acetaminophen (TYLENOL) tablet 650 mg  650 mg Oral Q6H PRN Dynesha, Woolen, PA-C   650 mg at 12/23/18 1910  . albuterol (PROVENTIL HFA;VENTOLIN HFA) 108 (90 Base) MCG/ACT inhaler 1-2 puff  1-2 puff Inhalation Q6H PRN Withrow, Elyse Jarvis, FNP   1 puff at 12/23/18 1910  . albuterol (PROVENTIL) (2.5 MG/3ML) 0.083% nebulizer solution 2.5 mg  2.5 mg Nebulization Q6H PRN Benjamine Mola, FNP   2.5 mg at 12/22/18 1420  . FLUoxetine (PROZAC) capsule 40 mg  40 mg Oral Daily Johnn Hai, MD   40 mg at 12/24/18 0855  . nicotine (NICODERM CQ - dosed in mg/24 hours) patch 21 mg  21 mg Transdermal Daily Withrow, Elyse Jarvis, FNP      . QUEtiapine (SEROQUEL) tablet 200 mg  200 mg Oral QHS Benjamine Mola, FNP   200 mg at 12/23/18 2059  . traZODone (DESYREL) tablet 100 mg  100 mg Oral QHS PRN Benjamine Mola, FNP   100 mg at 12/23/18 2059    Lab Results:  No results found for this or any previous visit (from the past 48 hour(s)).  Blood Alcohol level:  Lab Results  Component Value Date   ETH <10 12/21/2018   ETH <10 97/01/6377    Metabolic Disorder Labs: Lab Results  Component Value Date   HGBA1C 5.3 08/25/2018   MPG 105.41 08/25/2018   MPG 114.02 10/09/2017   No results found for: PROLACTIN Lab Results  Component Value Date   CHOL 148 08/25/2018   TRIG 99 10/21/2018   HDL 52 08/25/2018   CHOLHDL 2.8 08/25/2018   VLDL 13 08/25/2018   LDLCALC 83 08/25/2018   LDLCALC 69 10/09/2017    Physical Findings: AIMS: Facial and Oral Movements Muscles of Facial Expression: None, normal Lips and Perioral Area: None, normal Jaw: None, normal Tongue: None, normal,Extremity Movements Upper (arms, wrists, hands, fingers): None, normal Lower  (legs, knees, ankles, toes): None, normal, Trunk Movements Neck, shoulders, hips: None, normal, Overall Severity Severity of abnormal movements (highest score from questions above): None, normal Incapacitation due to abnormal movements: None, normal Patient's awareness of abnormal movements (rate only patient's report): No Awareness, Dental Status Current problems with teeth and/or dentures?: No Does patient usually wear dentures?: No  CIWA:  COWS:     Musculoskeletal: Strength & Muscle Tone: within normal limits Gait & Station: normal Psychiatric Specialty Exam: Physical Exam  Nursing note and vitals reviewed. Constitutional: She is oriented to person, place, and time. She appears well-developed and well-nourished.  Respiratory: Effort normal.  Musculoskeletal: Normal range of motion.  Neurological: She is alert and oriented to person, place, and time.  Skin: Skin is warm.    Review of Systems  Constitutional: Negative.   HENT: Negative.   Eyes: Negative.   Respiratory: Negative.   Cardiovascular: Negative.   Gastrointestinal: Negative.   Genitourinary: Negative.   Musculoskeletal: Negative.   Skin: Negative.   Neurological: Negative.   Endo/Heme/Allergies: Negative.   Psychiatric/Behavioral: Negative.     Blood pressure (!) 121/102, pulse (!) 111, temperature 97.7 F (36.5 C), temperature source Oral, resp. rate 18, height 5\' 4"  (1.626 m), weight 75 kg, last menstrual period 12/07/2018, SpO2 100 %.Body mass index is 28.38 kg/m.  General Appearance: Casual  Eye Contact:  Good  Speech:  Clear and Coherent and Normal Rate  Volume:  Normal  Mood:  Euthymic  Affect:  Congruent  Thought Process:  Coherent and Descriptions of Associations: Intact  Orientation:  Full (Time, Place, and Person)  Thought Content:  Logical  Suicidal Thoughts:  No  Homicidal Thoughts:  No  Memory:  Immediate;   Good  Judgement:  Good  Insight:  Good  Psychomotor Activity:  Normal   Concentration:  Concentration: Fair  Recall:  [place] of Knowledge:  Good  Language:  Good  Akathisia:  Negative  Handed:  Right  AIMS (if indicated):     Assets:  Resilience  ADL's:  Intact  Cognition:  WNL  Sleep:  Number of Hours: 6.25   Problems addressed Bipolar 1 disorder most recent episode depressed Cocaine use disorder Suicidal ideation Tobacco use disorder  Treatment Plan Summary: Daily contact with patient to assess and evaluate symptoms and progress in treatment, Medication management and Plan is to: Continue Prozac 40 mg p.o. daily for mood stability Continue Seroquel 200 mg p.o. nightly for mood stability Continue trazodone 100 mg p.o. nightly as needed for insomnia Encourage group therapy participation Patient is scheduled to be discharged tomorrow  Lewis Shock, FNP 12/24/2018, 10:06 AM    ..Agree with NP Progress Note

## 2018-12-24 NOTE — [facility] Group Notes (Signed)
  [facility] LCSW Group Therapy Note  Date/Time:  12/24/2018 11:15AM-12:00PM  Type of Therapy and Topic:  Group Therapy:  Feelings About Hospitalization  Participation Level:  Active   Description of Group This process group involved patients discussing their feelings related to being hospitalized, as well as the benefits they see to being in the hospital.  These feelings and benefits were itemized.  The group then brainstormed specific ways in which they could seek those same benefits when they discharge and return home.  Therapeutic Goals 1. Patient will identify and describe positive and negative feelings related to hospitalization 2. Patient will verbalize benefits of hospitalization to themselves personally 3. Patients will brainstorm together ways they can obtain similar benefits in the outpatient setting, identify barriers to wellness and possible solutions  Summary of Patient Progress:  The patient expressed her primary feelings about being hospitalized are fine, but she immediately told CSW not to ask her questions because that makes her nervous and she does not like it.  After another pt shared her discharge plan for ongoing sobriety one day at a time, then left the room for discharge, pt opened up and talked a long time about her problems with her boyfriend and how she wants to leave him.  She stated she takes drugs to self-medicate.  Therapeutic Modalities Cognitive Behavioral Therapy Motivational Interviewing    Selmer Dominion, LCSW 12/24/2018, 12:58 PM

## 2018-12-24 NOTE — Progress Notes (Signed)
D. Pt has been calm and cooperative- observed interacting appropriately with peers and staff in the milieu. Pt reports improving mood- is friendly upon approach. Pt reports that she is looking forward to her discharge tomorrow. Pt currently denies SI/HI and AVH and agrees to contact staff before acting on any harmful thoughts.  A. Labs and vitals monitored. Pt compliant with medications. Pt supported emotionally and encouraged to express concerns and ask questions.   R. Pt remains safe with 15 minute checks. Will continue POC.

## 2018-12-25 DIAGNOSIS — F1410 Cocaine abuse, uncomplicated: Secondary | ICD-10-CM

## 2018-12-25 DIAGNOSIS — F1210 Cannabis abuse, uncomplicated: Secondary | ICD-10-CM

## 2018-12-25 MED ORDER — FLUOXETINE HCL 40 MG PO CAPS: 40.0000 mg | ORAL_CAPSULE | Freq: Every day | ORAL | 0 refills | Status: DC

## 2018-12-25 MED ORDER — TRAZODONE HCL 100 MG PO TABS: 100.0000 mg | ORAL_TABLET | Freq: Every evening | ORAL | 0 refills | Status: DC | PRN

## 2018-12-25 MED ORDER — QUETIAPINE FUMARATE 200 MG PO TABS: 200.0000 mg | ORAL_TABLET | Freq: Every day | ORAL | 0 refills | Status: DC

## 2018-12-25 MED ORDER — NICOTINE 21 MG/24HR TD PT24: 21.0000 mg | MEDICATED_PATCH | Freq: Every day | TRANSDERMAL | 0 refills | Status: DC

## 2018-12-25 NOTE — Discharge Summary (Addendum)
Physician Discharge Summary Note  Patient:  Deborah Melendez is an 42 y.o., female MRN:  [id number] DOB:  14-Dec-1976 Patient phone:  [phone] (home)  Patient address:   [place] [address],  Total Time spent with patient: 45 minutes  Date of Admission:  12/21/2018 Date of Discharge: 12/25/2018  Reason for Admission: Pt is a 20yo unemployed female, living with her boyfriend of 7 years, with a 3yo daughter living with the father of the child. Pt has a history of bipolar-1 depression, polysubstance abuse including crack/cocaine, benzos, THC, insomnia, and severe depression. Pt states PTSD but does not identify any trauma to this provider. Patient reports she called EMS yesterday and was brought to ED. States she has been depressed,sad, with suicidal ideation, with thoughts of overdosing on her Trazodone. Reports neuro-vegetative symptoms- poor sleep, poor energy, anhedonia. Denies psychotic symptoms yet history of manic and hypomanic episodes. Reports using Cocaine several times per week. Admission UDS positive for Cocaine and Cannabis. Denies alcohol abuse. Reports she is facing multiple stressors- BF is facing possible deportation, , drug abuse,recent medical admission for COPD exacerbation in October 2019 with multiple intubations and a 15 day inpatient stay. 12/25 CXR unremarkable. Reports she had not been taking medications regularly prior to admission. She had been prescribed Seroquel and Zoloft in the past, off them for more than a month. Pt was on 300mg  Seroquel QHS and Zoloft 100mg  in addition to Trazodone 300mg . Informed pt that we will trial Prozac, restart Seroquel at 200mg  (QTc 436ms), and do PRN Trazodone at 100mg  to assess tolerance and efficacy of regimen before upward titration.   Today, pt seen and chart reviewed for H&P: Pt is alert/oriented x4, calm, cooperative, and appropriate to situation. Pt denies suicidal/homicidal ideation and psychosis and  does not appear to be responding to internal stimuli. Pt does present as restless and mildly hypomanic. We considered moving her to 300 hall, yet will hold off for now and observe her behavior for >24h before making this decision. Pt reports she has been very depressed with her boyfriend and cites this as the primary cause for her depression. Pt reports having a bottle of Trazodone and taking 2, then considering taking 2 every couple hours to "see what happens" when she felt hopeless prior to coming to the ED. Pt is able to contract for safety in the hospital yet fearful of ability to refrain from suicide outside of hospital at this time.   Associated Signs/Symptoms: Depression Symptoms:  depressed mood, anhedonia, insomnia, psychomotor agitation, hopelessness, recurrent thoughts of death, suicidal thoughts with specific plan, (Hypo) Manic Symptoms:  Distractibility, Flight of Ideas, Impulsivity, hypomanic Anxiety Symptoms:  Excessive Worry, Psychotic Symptoms:  denies PTSD Symptoms: NA  Past Psychiatric History: BP-1, cocaine/crack abuse, benzo abuse, depression  Principal Problem: Bipolar I disorder, most recent episode depressed ([place]) Discharge Diagnoses: Principal Problem:   Bipolar I disorder, most recent episode depressed ([place]) Active Problems:   Cocaine use disorder, severe, dependence ([place])   Suicidal ideation   Tobacco use disorder   Asthma, chronic  Past Medical History:  Past Medical History:  Diagnosis Date  . Asthma   . Bipolar 1 disorder, depressed, severe ([place]) 02/04/2017  . Cocaine abuse with cocaine-induced mood disorder ([place]) 07/27/2017  . COPD (chronic obstructive pulmonary disease) ([place])   . Depression   . Homicidal ideations   . Manic behavior ([place])   . MDD (major depressive disorder), recurrent severe, without psychosis ([place]) 08/08/2015  . Substance or  medication-induced bipolar and related disorder with onset during intoxication ([place]) 09/08/2016  .  Suicidal ideation     Past Surgical History:  Procedure Laterality Date  . FINGER SURGERY     Family History:  Family History  Problem Relation Age of Onset  . Diabetes Mother   . Hypertension Mother   . Drug abuse Father   . Schizophrenia Maternal Aunt    Family Psychiatric  History: depression   Social History:  Social History   Substance and Sexual Activity  Alcohol Use Yes   Comment: pt denies drinking at this time     Social History   Substance and Sexual Activity  Drug Use Yes  . Frequency: 1.0 times per week  . Types: Cocaine, Marijuana   Comment: last use 12/06/18    Social History   Socioeconomic History  . Marital status: Single    Spouse name: Not on file  . Number of children: Not on file  . Years of education: Not on file  . Highest education level: Not on file  Occupational History  . Not on file  Social Needs  . Financial resource strain: Not on file  . Food insecurity:    Worry: Not on file    Inability: Not on file  . Transportation needs:    Medical: Not on file    Non-medical: Not on file  Tobacco Use  . Smoking status: Current Every Day Smoker    Packs/day: 1.00    Types: Cigarettes  . Smokeless tobacco: Never Used  . Tobacco comment: pt reported quiting three weeks ago  Substance and Sexual Activity  . Alcohol use: Yes    Comment: pt denies drinking at this time  . Drug use: Yes    Frequency: 1.0 times per week    Types: Cocaine, Marijuana    Comment: last use 12/06/18  . Sexual activity: Yes    Birth control/protection: None  Lifestyle  . Physical activity:    Days per week: Not on file    Minutes per session: Not on file  . Stress: Not on file  Relationships  . Social connections:    Talks on phone: Not on file    Gets together: Not on file    Attends religious service: Not on file    Active member of club or organization: Not on file    Attends meetings of clubs or organizations: Not on file    Relationship status:  Not on file  Other Topics Concern  . Not on file  Social History Narrative  . Not on file    Hospital Course:  Deborah Melendez was admitted for Bipolar I disorder, most recent episode depressed ([place]) and crisis management.  She was treated with the following medications Prozac 40mg  po daily, Seroquel 200mg  po qhs, and trazodone 100mg  po qhs.  Maanvi Denette Kaney was discharged with current medication and was instructed on how to take medications as prescribed; (details listed below under Medication List).  Medical problems were identified and treated as needed.  Home medications were restarted as appropriate. Labs obtained during this admission have been reviewed and assessed. All labs were determined to be within normal except UDS which was positive for cocaine and and THC.   Improvement was monitored by observation and [name] daily report of symptom reduction.  Emotional and mental status was monitored by daily self-inventory reports completed by [name] and clinical staff.         [facility]  Denette Mote was evaluated by the treatment team for stability and plans for continued recovery upon discharge.  [place] motivation was an integral factor for scheduling further treatment.  Employment, transportation, bed availability, health status, family support, and any pending legal issues were also considered during her hospital stay.  She was offered further treatment options upon discharge including but not limited to Residential, Intensive Outpatient, and Outpatient treatment.  Analena Denette Tenaglia will follow up with the services as listed below under Follow Up Information.    Upon completion of this admission the [facility] Schnitzler was both mentally and medically stable for discharge denying suicidal/homicidal ideation, auditory/visual/tactile hallucinations, delusional thoughts and paranoia.      Physical Findings: AIMS:  Facial and Oral Movements Muscles of Facial Expression: None, normal Lips and Perioral Area: None, normal Jaw: None, normal Tongue: None, normal,Extremity Movements Upper (arms, wrists, hands, fingers): None, normal Lower (legs, knees, ankles, toes): None, normal, Trunk Movements Neck, shoulders, hips: None, normal, Overall Severity Severity of abnormal movements (highest score from questions above): None, normal Incapacitation due to abnormal movements: None, normal Patient's awareness of abnormal movements (rate only patient's report): No Awareness, Dental Status Current problems with teeth and/or dentures?: No Does patient usually wear dentures?: No   Musculoskeletal: Strength & Muscle Tone: within normal limits Gait & Station: normal Patient leans: N/A  Psychiatric Specialty Exam: See MD SRA Physical Exam  ROS  Blood pressure 115/87, pulse (!) 101, temperature 98.2 F (36.8 C), temperature source Oral, resp. rate 18, height 5\' 4"  (1.626 m), weight 75 kg, last menstrual period 12/07/2018, SpO2 100 %.Body mass index is 28.38 kg/m.  Sleep:  Number of Hours: 6.5     Have you used any form of tobacco in the last 30 days? (Cigarettes, Smokeless Tobacco, Cigars, and/or Pipes): No  Has this patient used any form of tobacco in the last 30 days? (Cigarettes, Smokeless Tobacco, Cigars, and/or Pipes)  No  Blood Alcohol level:  Lab Results  Component Value Date   ETH <10 12/21/2018   ETH <10 35/46/5681    Metabolic Disorder Labs:  Lab Results  Component Value Date   HGBA1C 5.3 08/25/2018   MPG 105.41 08/25/2018   MPG 114.02 10/09/2017   No results found for: PROLACTIN Lab Results  Component Value Date   CHOL 148 08/25/2018   TRIG 99 10/21/2018   HDL 52 08/25/2018   CHOLHDL 2.8 08/25/2018   VLDL 13 08/25/2018   LDLCALC 83 08/25/2018   LDLCALC 69 10/09/2017    See Psychiatric Specialty Exam and Suicide Risk Assessment completed by Attending Physician prior to  discharge.  Discharge destination:  Home  Is patient on multiple antipsychotic therapies at discharge:  No   Has Patient had three or more failed trials of antipsychotic monotherapy by history:  No  Recommended Plan for Multiple Antipsychotic Therapies: NA  Discharge Instructions    Discharge instructions   Complete by:  As directed      Allergies as of 12/25/2018   No Known Allergies     Medication List    TAKE these medications     Indication  FLUoxetine 40 MG capsule Commonly known as:  PROZAC Take 1 capsule (40 mg total) by mouth daily. Start taking on:  December 26, 2018  Indication:  Depression   nicotine 21 mg/24hr patch Commonly known as:  NICODERM CQ - dosed in mg/24 hours Place 1 patch (21 mg total) onto the skin daily. Start taking on:  December 26, 2018  Indication:  Nicotine Addiction   QUEtiapine 200 MG tablet Commonly known as:  SEROQUEL Take 1 tablet (200 mg total) by mouth at bedtime.  Indication:  Agitation, Manic Phase of Manic-Depression   traZODone 100 MG tablet Commonly known as:  DESYREL Take 1 tablet (100 mg total) by mouth at bedtime as needed for sleep.  Indication:  Trouble Sleeping      Follow-up [facility]. Go on 12/29/2018.   Specialty:  Behavioral Health Why:  Your next hospital follow up appointment is Thursday, 12/29/18 at 8:00a.  Please bring: photo ID, proof of insurance, social security card, and any discharge paperwork from this hospitalization.  Contact information: [place] [address] [phone]           Follow-up recommendations:  Activity:  Increase activity as tolerated Diet:  Regular diet Tests:  Routine tests as directed by outpatient psychiatry.  Other:  Even if you begin to feel better continue taking your medications. Please make sure you keep all appointments as directed.   Signed: Suella Broad, FNP 12/25/2018, 10:27 AM

## 2018-12-25 NOTE — Progress Notes (Signed)
D. Pt has been calm and cooperative- reports not having slept well last night and has stayed in bed sleeping for most of the morning.Pt currently denies SI/HI and AVH and agrees to contact staff before acting on any harmful thoughts.  A. Labs and vitals monitored. Pt compliant with medications. Pt supported emotionally and encouraged to express concerns and ask questions.   R. Pt remains safe with 15 minute checks. Will continue POC.

## 2018-12-25 NOTE — BHH Suicide Risk Assessment (Signed)
[facility] Discharge Suicide Risk Assessment   Principal Problem: Bipolar I disorder, most recent episode depressed ([place]) Discharge Diagnoses: Principal Problem:   Bipolar I disorder, most recent episode depressed ([place]) Active Problems:   Cocaine use disorder, severe, dependence ([place])   Suicidal ideation   Tobacco use disorder   Asthma, chronic   Total Time spent with patient: 30 minutes  Musculoskeletal: Strength & Muscle Tone: within normal limits Gait & Station: normal Patient leans: N/A  Psychiatric Specialty Exam: ROS denies headache, no chest pain, no shortness of breath, no vomiting   Blood pressure 115/87, pulse (!) 101, temperature 98.2 F (36.8 C), temperature source Oral, resp. rate 18, height 5\' 4"  (1.626 m), weight 75 kg, last menstrual period 12/07/2018, SpO2 100 %.Body mass index is 28.38 kg/m.  General Appearance: Well Groomed  Eye Contact::  Good  Speech:  Normal Rate409  Volume:  Normal  Mood:  reports she is feeling better than on admission]  Affect:  reactive, smiles at times appropriately   Thought Process:  Linear and Descriptions of Associations: Intact  Orientation:  Other:  fully alert and attentive  Thought Content:  no hallucinations, no delusions, not internally preoccupied   Suicidal Thoughts:  No denies suicidal or self injurious ideations, denies homicidal or violent ideations   Homicidal Thoughts:  No  Memory:  recent and remote grossly intact   Judgement:  Fair/ improving   Insight:  Fair/improving   Psychomotor Activity:  Normal  Concentration:  Good  Recall:  Good  Fund of Knowledge:Good  Language: Good  Akathisia:  Negative  Handed:  Right  AIMS (if indicated):   no abnormal or involuntary movements noted or reported   Assets:  Communication Skills Desire for Improvement Resilience  Sleep:  Number of Hours: 6.5  Cognition: WNL  ADL's:  Intact   Mental Status Per Nursing Assessment::   On Admission:  Suicidal ideation indicated by  patient  Demographic Factors:  52, lives with BF and child, unemployed   Loss Factors: Substance abuse, concerns about her medical illness ( Asthma) , concern BF may be deported  Historical Factors: History of Depression, History of Cocaine Abuse /Cannabis Abuse . History of past psychiatric admissions, no prior history of suicide attempts   Risk Reduction Factors:   Sense of responsibility to family, Living with another person, especially a relative, Positive social support and Positive coping skills or problem solving skills  Continued Clinical Symptoms:  At this time patient is alert, attentive, well related, presents calm, pleasant on approach, mood described as improved, affect more reactive, no thought disorder, no hallucinations, no delusions, not internally preoccupied . Behavior on unit in good control, no disruptive or agitated behaviors. Denies medication side effects- side effects reviewed, including risk of metabolic , motor disturbances, sedation, weight gain Currently denies any dyspnea and is breathing comfortably at room air   Cognitive Features That Contribute To Risk:  No gross cognitive deficits noted upon discharge. Is alert , attentive, and oriented x 3  Suicide Risk:  Mild:  Suicidal ideation of limited frequency, intensity, duration, and specificity.  There are no identifiable plans, no associated intent, mild dysphoria and related symptoms, good self-control (both objective and subjective assessment), few other risk factors, and identifiable protective factors, including available and accessible social support.  Follow-up Information    [facility]. Go on 12/29/2018.   Specialty:  Behavioral Health Why:  Your next hospital follow up appointment is Thursday, 12/29/18 at 8:00a.  Please bring: photo ID, proof of  insurance, social security card, and any discharge paperwork from this hospitalization.  Contact information: [place]  [address] [phone]           Plan Of Care/Follow-up recommendations:  Activity:  as tolerated  Diet:  regular Tests:  NA Other:  See below  Patient is expressing readiness for discharge, no current grounds for ongoing involuntary commitment . Patient is leaving unit in good spirits. Plans to return home. Follow up as above . She has an established PCP at [facility].  Jenne Campus, MD 12/25/2018, 10:56 AM

## 2018-12-25 NOTE — Progress Notes (Signed)
  [facility] Adult Case Management Discharge Plan :  Will you be returning to the same living situation after discharge:  Yes,  with significant other and daughter At discharge, do you have transportation home?: Yes,  bus pass provided Do you have the ability to pay for your medications: No.  Release of information consent forms completed and turned in to Medical Records by CSW.   Patient to Follow up at: Follow-up Information    [facility]. Go on 12/29/2018.   Specialty:  Behavioral Health Why:  Your next hospital follow up appointment is Thursday, 12/29/18 at 8:00a.  Please bring: photo ID, proof of insurance, social security card, and any discharge paperwork from this hospitalization.  Contact information: [place] [address] [phone]           Next level of care provider has access to [place] and Suicide Prevention discussed: Yes,  with boyfriend  Have you used any form of tobacco in the last 30 days? (Cigarettes, Smokeless Tobacco, Cigars, and/or Pipes): No  Has patient been referred to the Quitline?: N/A patient is not a smoker  Patient has been referred for addiction treatment: Yes  Maretta Los, LCSW 12/25/2018, 8:17 AM

## 2018-12-25 NOTE — Progress Notes (Signed)
Pt discharged to lobby with bus pass. Pt was stable and appreciative at that time. All papers and samples were given and valuables returned. Verbal understanding expressed. Denies SI/HI and A/VH. Pt given opportunity to express concerns and ask questions.

## 2018-12-25 NOTE — BHH Group Notes (Signed)
[facility] LCSW Group Therapy Note  Date/Time:  12/25/2018  11:00AM-12:00PM  Type of Therapy and Topic:  Group Therapy:  Music and Mood  Participation Level:  Active   Description of Group: In this process group, members listened to a variety of genres of music and identified that different types of music evoke different responses.  Patients were encouraged to identify music that was soothing for them and music that was energizing for them.  Patients discussed how this knowledge can help with wellness and recovery in various ways including managing depression and anxiety as well as encouraging healthy sleep habits.    Therapeutic Goals: 1. Patients will explore the impact of different varieties of music on mood 2. Patients will verbalize the thoughts they have when listening to different types of music 3. Patients will identify music that is soothing to them as well as music that is energizing to them 4. Patients will discuss how to use this knowledge to assist in maintaining wellness and recovery 5. Patients will explore the use of music as a coping skill  Summary of Patient Progress:  At the beginning of group, patient expressed she felt confused and somewhat nervous about leaving the hospital.  She was interested throughout group despite many disruptions, and at the end of group said she felt "a little better."  Therapeutic Modalities: Solution Focused Brief Therapy Activity   Selmer Dominion, LCSW

## 2018-12-25 NOTE — Progress Notes (Signed)
Nursing note 7p-7a  Pt observed interacting with peers on unit this shift. Displayed a flat affect and mood upon interaction with this Probation officer, very guarded. Pt denies pain ,denies SI/HI, and also denies any audio or visual hallucinations at this time. Pt complains of insomnia, see MAR for prn medication administration. Pt is able to verbally contract for safety with this RN. Goal: "to discharge" Pt educated on falls and encouraged to wear nonskid socks when ambulating in the halls. Pt also encouraged to call for help if feeling weak or dizzy. Pt verbalized understanding of all education provided. Pt is now resting in bed with eyes closed, with no signs or symptoms of pain or distress noted. Pt continues to remain safe on the unit and is observed by rounding every 15 min. RN will continue to monitor.

## 2019-01-09 ENCOUNTER — Other Ambulatory Visit: Payer: Self-pay

## 2019-01-09 ENCOUNTER — Emergency Department (HOSPITAL_COMMUNITY): Payer: Self-pay

## 2019-01-09 ENCOUNTER — Emergency Department (HOSPITAL_COMMUNITY)
Admission: EM | Admit: 2019-01-09 | Discharge: 2019-01-10 | Disposition: A | Discharge status: Home / Self Care | Payer: Self-pay | Attending: Emergency Medicine | Admitting: Emergency Medicine

## 2019-01-09 ENCOUNTER — Encounter (HOSPITAL_COMMUNITY): Payer: Self-pay

## 2019-01-09 DIAGNOSIS — J449 Chronic obstructive pulmonary disease, unspecified: Secondary | ICD-10-CM | POA: Insufficient documentation

## 2019-01-09 DIAGNOSIS — R0602 Shortness of breath: Secondary | ICD-10-CM | POA: Insufficient documentation

## 2019-01-09 DIAGNOSIS — Z5321 Procedure and treatment not carried out due to patient leaving prior to being seen by health care provider: Secondary | ICD-10-CM | POA: Insufficient documentation

## 2019-01-09 LAB — BASIC METABOLIC PANEL
Anion gap: 7 (ref 5–15)
BUN: 14 mg/dL (ref 6–20)
CO2: 22 mmol/L (ref 22–32)
Calcium: 8.7 mg/dL — ABNORMAL LOW (ref 8.9–10.3)
Chloride: 114 mmol/L — ABNORMAL HIGH (ref 98–111)
Creatinine, Ser: 0.8 mg/dL (ref 0.44–1.00)
GFR calc Af Amer: >60 mL/min (ref >60)
GFR calc non Af Amer: >60 mL/min (ref >60)
Glucose, Bld: 113 mg/dL — ABNORMAL HIGH (ref 70–99)
Potassium: 3.7 mmol/L (ref 3.5–5.1)
SODIUM: 143 mmol/L (ref 135–145)

## 2019-01-09 LAB — CBC
HCT: 31.7 % — ABNORMAL LOW (ref 36.0–46.0)
HEMOGLOBIN: 9.6 g/dL — AB (ref 12.0–15.0)
MCH: 27.3 pg (ref 26.0–34.0)
MCHC: 30.3 g/dL (ref 30.0–36.0)
MCV: 90.1 fL (ref 80.0–100.0)
Platelets: 283 10*3/uL (ref 150–400)
RBC: 3.52 MIL/uL — ABNORMAL LOW (ref 3.87–5.11)
RDW: 17.2 % — ABNORMAL HIGH (ref 11.5–15.5)
WBC: 4.2 10*3/uL (ref 4.0–10.5)
nRBC: 0 % (ref 0.0–0.2)

## 2019-01-09 LAB — I-STAT TROPONIN, ED: Troponin i, poc: 0 ng/mL (ref 0.00–0.08)

## 2019-01-09 LAB — I-STAT BETA HCG BLOOD, ED (MC, WL, AP ONLY): I-stat hCG, quantitative: <5 m[IU]/mL (ref <5)

## 2019-01-09 MED ORDER — ALBUTEROL SULFATE (2.5 MG/3ML) 0.083% IN NEBU
5.0000 mg | INHALATION_SOLUTION | Freq: Once | RESPIRATORY_TRACT | Status: AC
  Administered 2019-01-09: 5 mg via RESPIRATORY_TRACT
  Filled 2019-01-09: qty 6

## 2019-01-09 NOTE — ED Triage Notes (Signed)
Pt here with hx of COPD and asthma stating getting progressively short of breath over the last 3 hours.  Used rescue inhaler 4-5 times PTA and states has not helped.  PT talking in short phrases.

## 2019-01-09 NOTE — ED Notes (Signed)
Called for room x 2 with no answer  

## 2019-01-18 ENCOUNTER — Emergency Department (HOSPITAL_COMMUNITY): Payer: Self-pay

## 2019-01-18 ENCOUNTER — Observation Stay (HOSPITAL_COMMUNITY)
Admission: EM | Admit: 2019-01-18 | Discharge: 2019-01-19 | Disposition: A | Discharge status: Home / Self Care | Payer: Self-pay | Attending: Internal Medicine | Admitting: Internal Medicine

## 2019-01-18 ENCOUNTER — Other Ambulatory Visit: Payer: Self-pay

## 2019-01-18 ENCOUNTER — Encounter (HOSPITAL_COMMUNITY): Payer: Self-pay | Admitting: Emergency Medicine

## 2019-01-18 DIAGNOSIS — J45901 Unspecified asthma with (acute) exacerbation: Secondary | ICD-10-CM | POA: Diagnosis present

## 2019-01-18 DIAGNOSIS — F17210 Nicotine dependence, cigarettes, uncomplicated: Secondary | ICD-10-CM | POA: Insufficient documentation

## 2019-01-18 DIAGNOSIS — R0602 Shortness of breath: Secondary | ICD-10-CM | POA: Diagnosis present

## 2019-01-18 DIAGNOSIS — J441 Chronic obstructive pulmonary disease with (acute) exacerbation: Secondary | ICD-10-CM | POA: Insufficient documentation

## 2019-01-18 DIAGNOSIS — Z79899 Other long term (current) drug therapy: Secondary | ICD-10-CM | POA: Insufficient documentation

## 2019-01-18 DIAGNOSIS — J9621 Acute and chronic respiratory failure with hypoxia: Secondary | ICD-10-CM

## 2019-01-18 DIAGNOSIS — Z8249 Family history of ischemic heart disease and other diseases of the circulatory system: Secondary | ICD-10-CM | POA: Insufficient documentation

## 2019-01-18 DIAGNOSIS — J9601 Acute respiratory failure with hypoxia: Principal | ICD-10-CM

## 2019-01-18 DIAGNOSIS — F319 Bipolar disorder, unspecified: Secondary | ICD-10-CM | POA: Insufficient documentation

## 2019-01-18 DIAGNOSIS — F1410 Cocaine abuse, uncomplicated: Secondary | ICD-10-CM | POA: Insufficient documentation

## 2019-01-18 LAB — BASIC METABOLIC PANEL
Anion gap: 9 (ref 5–15)
BUN: 6 mg/dL (ref 6–20)
CO2: 21 mmol/L — ABNORMAL LOW (ref 22–32)
Calcium: 8.7 mg/dL — ABNORMAL LOW (ref 8.9–10.3)
Chloride: 111 mmol/L (ref 98–111)
Creatinine, Ser: 0.89 mg/dL (ref 0.44–1.00)
GFR calc Af Amer: >60 mL/min (ref >60)
GFR calc non Af Amer: >60 mL/min (ref >60)
Glucose, Bld: 123 mg/dL — ABNORMAL HIGH (ref 70–99)
Potassium: 3.6 mmol/L (ref 3.5–5.1)
Sodium: 141 mmol/L (ref 135–145)

## 2019-01-18 LAB — RESPIRATORY PANEL BY PCR

## 2019-01-18 LAB — INFLUENZA PANEL BY PCR (TYPE A & B)
Influenza A By PCR: NEGATIVE
Influenza B By PCR: NEGATIVE

## 2019-01-18 LAB — CBC WITH DIFFERENTIAL/PLATELET
Abs Immature Granulocytes: 0.01 10*3/uL (ref 0.00–0.07)
Basophils Absolute: 0 10*3/uL (ref 0.0–0.1)
Basophils Relative: 1 %
Eosinophils Absolute: 0.4 10*3/uL (ref 0.0–0.5)
Eosinophils Relative: 6 %
HCT: 32.8 % — ABNORMAL LOW (ref 36.0–46.0)
HEMOGLOBIN: 10 g/dL — AB (ref 12.0–15.0)
Immature Granulocytes: 0 %
LYMPHS PCT: 25 %
Lymphs Abs: 1.5 10*3/uL (ref 0.7–4.0)
MCH: 26.8 pg (ref 26.0–34.0)
MCHC: 30.5 g/dL (ref 30.0–36.0)
MCV: 87.9 fL (ref 80.0–100.0)
Monocytes Absolute: 0.5 10*3/uL (ref 0.1–1.0)
Monocytes Relative: 9 %
Neutro Abs: 3.5 10*3/uL (ref 1.7–7.7)
Neutrophils Relative %: 59 %
Platelets: 211 10*3/uL (ref 150–400)
RBC: 3.73 MIL/uL — ABNORMAL LOW (ref 3.87–5.11)
RDW: 17.2 % — ABNORMAL HIGH (ref 11.5–15.5)
WBC: 5.9 10*3/uL (ref 4.0–10.5)
nRBC: 0 % (ref 0.0–0.2)

## 2019-01-18 LAB — I-STAT BETA HCG BLOOD, ED (MC, WL, AP ONLY): I-stat hCG, quantitative: <5 m[IU]/mL (ref <5)

## 2019-01-18 LAB — TROPONIN I
Troponin I: <0.03 ng/mL (ref <0.03)
Troponin I: <0.03 ng/mL (ref <0.03)

## 2019-01-18 LAB — BRAIN NATRIURETIC PEPTIDE: B Natriuretic Peptide: 23.9 pg/mL (ref 0.0–100.0)

## 2019-01-18 LAB — GLUCOSE, CAPILLARY: Glucose-Capillary: 188 mg/dL — ABNORMAL HIGH (ref 70–99)

## 2019-01-18 LAB — PHOSPHORUS: Phosphorus: 2.6 mg/dL (ref 2.5–4.6)

## 2019-01-18 LAB — MAGNESIUM: Magnesium: 2.2 mg/dL (ref 1.7–2.4)

## 2019-01-18 MED ORDER — FERROUS SULFATE 325 (65 FE) MG PO TABS
325.0000 mg | ORAL_TABLET | Freq: Three times a day (TID) | ORAL | Status: DC
  Administered 2019-01-18 – 2019-01-19 (×2): 325 mg via ORAL
  Filled 2019-01-18 (×2): qty 1

## 2019-01-18 MED ORDER — IPRATROPIUM BROMIDE 0.02 % IN SOLN
1.0000 mg | Freq: Once | RESPIRATORY_TRACT | Status: AC
  Administered 2019-01-18: 1 mg via RESPIRATORY_TRACT
  Filled 2019-01-18: qty 5

## 2019-01-18 MED ORDER — METHYLPREDNISOLONE SODIUM SUCC 125 MG IJ SOLR
125.0000 mg | Freq: Once | INTRAMUSCULAR | Status: AC
  Administered 2019-01-18: 125 mg via INTRAVENOUS
  Filled 2019-01-18: qty 2

## 2019-01-18 MED ORDER — BUDESONIDE 0.25 MG/2ML IN SUSP
0.2500 mg | Freq: Two times a day (BID) | RESPIRATORY_TRACT | Status: DC
  Administered 2019-01-18: 0.25 mg via RESPIRATORY_TRACT
  Filled 2019-01-18 (×2): qty 2

## 2019-01-18 MED ORDER — KETOROLAC TROMETHAMINE 15 MG/ML IJ SOLN
15.0000 mg | Freq: Once | INTRAMUSCULAR | Status: AC
  Administered 2019-01-18: 15 mg via INTRAVENOUS
  Filled 2019-01-18: qty 1

## 2019-01-18 MED ORDER — ALBUTEROL (5 MG/ML) CONTINUOUS INHALATION SOLN
10.0000 mg/h | INHALATION_SOLUTION | Freq: Once | RESPIRATORY_TRACT | Status: AC
  Administered 2019-01-18: 10 mg/h via RESPIRATORY_TRACT
  Filled 2019-01-18: qty 20

## 2019-01-18 MED ORDER — ALBUTEROL SULFATE (2.5 MG/3ML) 0.083% IN NEBU
5.0000 mg | INHALATION_SOLUTION | Freq: Once | RESPIRATORY_TRACT | Status: AC
  Administered 2019-01-18: 5 mg via RESPIRATORY_TRACT
  Filled 2019-01-18: qty 6

## 2019-01-18 MED ORDER — BENZONATATE 100 MG PO CAPS
200.0000 mg | ORAL_CAPSULE | Freq: Three times a day (TID) | ORAL | Status: DC | PRN
  Administered 2019-01-18 – 2019-01-19 (×3): 200 mg via ORAL
  Filled 2019-01-18 (×3): qty 2

## 2019-01-18 MED ORDER — OSELTAMIVIR PHOSPHATE 75 MG PO CAPS
75.0000 mg | ORAL_CAPSULE | Freq: Two times a day (BID) | ORAL | Status: DC
  Administered 2019-01-18 (×2): 75 mg via ORAL
  Filled 2019-01-18 (×2): qty 1

## 2019-01-18 MED ORDER — SODIUM CHLORIDE 0.9 % IV BOLUS
1000.0000 mL | Freq: Once | INTRAVENOUS | Status: AC
  Administered 2019-01-18: 1000 mL via INTRAVENOUS

## 2019-01-18 MED ORDER — FLUTICASONE PROPIONATE 50 MCG/ACT NA SUSP
2.0000 | Freq: Every day | NASAL | Status: DC
  Filled 2019-01-18: qty 16

## 2019-01-18 MED ORDER — ENOXAPARIN SODIUM 40 MG/0.4ML ~~LOC~~ SOLN: 40.0000 mg | SUBCUTANEOUS | Status: DC

## 2019-01-18 MED ORDER — TRAZODONE HCL 50 MG PO TABS: 100.0000 mg | ORAL_TABLET | Freq: Every evening | ORAL | Status: DC | PRN

## 2019-01-18 MED ORDER — QUETIAPINE FUMARATE 200 MG PO TABS
200.0000 mg | ORAL_TABLET | Freq: Every day | ORAL | Status: DC
  Administered 2019-01-18: 200 mg via ORAL
  Filled 2019-01-18: qty 1

## 2019-01-18 MED ORDER — MAGNESIUM SULFATE 2 GM/50ML IV SOLN
2.0000 g | Freq: Once | INTRAVENOUS | Status: AC
  Administered 2019-01-18: 2 g via INTRAVENOUS
  Filled 2019-01-18: qty 50

## 2019-01-18 MED ORDER — LORATADINE 10 MG PO TABS
10.0000 mg | ORAL_TABLET | Freq: Every day | ORAL | Status: DC
  Administered 2019-01-18 – 2019-01-19 (×2): 10 mg via ORAL
  Filled 2019-01-18 (×2): qty 1

## 2019-01-18 MED ORDER — SODIUM CHLORIDE 0.9 % IV SOLN
Freq: Once | INTRAVENOUS | Status: AC
  Administered 2019-01-18: 10:00:00 via INTRAVENOUS

## 2019-01-18 MED ORDER — IPRATROPIUM-ALBUTEROL 0.5-2.5 (3) MG/3ML IN SOLN
3.0000 mL | Freq: Four times a day (QID) | RESPIRATORY_TRACT | Status: DC
  Administered 2019-01-18 (×2): 3 mL via RESPIRATORY_TRACT
  Filled 2019-01-18 (×4): qty 3

## 2019-01-18 MED ORDER — METHYLPREDNISOLONE SODIUM SUCC 125 MG IJ SOLR
60.0000 mg | Freq: Three times a day (TID) | INTRAMUSCULAR | Status: DC
  Administered 2019-01-18 – 2019-01-19 (×3): 60 mg via INTRAVENOUS
  Filled 2019-01-18 (×3): qty 2

## 2019-01-18 MED ORDER — DOXYCYCLINE HYCLATE 100 MG PO TABS
100.0000 mg | ORAL_TABLET | Freq: Two times a day (BID) | ORAL | Status: DC
  Administered 2019-01-18 – 2019-01-19 (×3): 100 mg via ORAL
  Filled 2019-01-18 (×3): qty 1

## 2019-01-18 MED ORDER — MONTELUKAST SODIUM 10 MG PO TABS
10.0000 mg | ORAL_TABLET | Freq: Every day | ORAL | Status: DC
  Administered 2019-01-18: 10 mg via ORAL
  Filled 2019-01-18: qty 1

## 2019-01-18 MED ORDER — ACETAMINOPHEN 500 MG PO TABS
1000.0000 mg | ORAL_TABLET | Freq: Once | ORAL | Status: AC
  Administered 2019-01-18: 1000 mg via ORAL
  Filled 2019-01-18: qty 2

## 2019-01-18 MED ORDER — GUAIFENESIN-DM 100-10 MG/5ML PO SYRP
5.0000 mL | ORAL_SOLUTION | ORAL | Status: DC | PRN
  Administered 2019-01-18 – 2019-01-19 (×2): 5 mL via ORAL
  Filled 2019-01-18 (×2): qty 5

## 2019-01-18 MED ORDER — FLUOXETINE HCL 20 MG PO CAPS
40.0000 mg | ORAL_CAPSULE | Freq: Every day | ORAL | Status: DC
  Administered 2019-01-18 – 2019-01-19 (×2): 40 mg via ORAL
  Filled 2019-01-18 (×2): qty 2

## 2019-01-18 MED ORDER — ACETAMINOPHEN 325 MG PO TABS
650.0000 mg | ORAL_TABLET | Freq: Four times a day (QID) | ORAL | Status: DC | PRN
  Administered 2019-01-19 (×2): 650 mg via ORAL
  Filled 2019-01-18 (×2): qty 2

## 2019-01-18 MED ORDER — ALBUTEROL SULFATE (2.5 MG/3ML) 0.083% IN NEBU
2.5000 mg | INHALATION_SOLUTION | RESPIRATORY_TRACT | Status: DC | PRN
  Administered 2019-01-19: 2.5 mg via RESPIRATORY_TRACT
  Filled 2019-01-18: qty 3

## 2019-01-18 NOTE — Progress Notes (Signed)
Breathing treatment stopped mid treatment.  Patient coughing and could not continue tx.  Patient vitals stable.  RT will continue to monitor.

## 2019-01-18 NOTE — ED Provider Notes (Signed)
[facility] EMERGENCY DEPARTMENT Provider Note   CSN: [id number] Arrival date & time: 01/18/19  5638     History   Chief Complaint Chief Complaint  Patient presents with  . Shortness of Breath    HPI Deborah Melendez is a 43 y.o. female.  HPI   43 yo F with extensive PMHx bipolar d/o, COPD here with SOB. Pt states that over the past 2-3 days, she's had nasal congestion, dry cough, and subjective fevers. Her 42 yo has a cold right now and she believes she picked it up from her. She reports that over the past 24 hours, she's had progressively worsening cough, wheezing, and SOB. She's been using her albuterol inhaler every 2- hours or less without significant relief. She endorses extreme dyspnea and difficulty speaking in full sentences. She subsequently presents for eval. Her cough is dry without sputum production. Denies any LE swelling or pain. No other complaints.   Past Medical History:  Diagnosis Date  . Asthma   . Bipolar 1 disorder, depressed, severe ([place]) 02/04/2017  . Cocaine abuse with cocaine-induced mood disorder ([place]) 07/27/2017  . COPD (chronic obstructive pulmonary disease) ([place])   . Depression   . Homicidal ideations   . Manic behavior ([place])   . MDD (major depressive disorder), recurrent severe, without psychosis ([place]) 08/08/2015  . Substance or medication-induced bipolar and related disorder with onset during intoxication ([place]) 09/08/2016  . Suicidal ideation     Patient Active Problem List   Diagnosis Date Noted  . MDD (major depressive disorder), severe ([place]) 12/21/2018  . Asthma, chronic 12/21/2018  . Bipolar I disorder, most recent episode depressed ([place]) 12/21/2018  . Altered mental status   . Acute on chronic respiratory failure with hypoxia ([place]) 10/14/2018  . Bronchitis 10/05/2018  . Acute bronchitis with asthma with acute exacerbation 10/04/2018  . Major depressive disorder, recurrent episode with mixed features ([place])  01/27/2018  . Tobacco use disorder 05/22/2017  . Suicidal ideation   . Cannabis use disorder, severe, dependence ([place]) 01/20/2017  . Cocaine use disorder, severe, dependence ([place]) 08/08/2015    Past Surgical History:  Procedure Laterality Date  . FINGER SURGERY       OB History    Gravida  6   Para  4   Term      Preterm      AB      Living  4     SAB      TAB      Ectopic      Multiple      Live Births  0            Home Medications    Prior to Admission medications   Medication Sig Start Date End Date Taking? Authorizing Provider  acetaminophen (TYLENOL) 500 MG tablet Take 1,000 mg by mouth every 6 (six) hours as needed for mild pain or headache.   Yes [provider]  albuterol (PROVENTIL HFA;VENTOLIN HFA) 108 (90 Base) MCG/ACT inhaler Inhale 2 puffs into the lungs every 6 (six) hours as needed for wheezing or shortness of breath.   Yes [provider]  ferrous sulfate 325 (65 FE) MG tablet Take 325 mg by mouth 3 (three) times daily with meals.   Yes [provider]  FLUoxetine (PROZAC) 40 MG capsule Take 1 capsule (40 mg total) by mouth daily. 12/26/18  Yes Starkes-Perry, Gayland Curry, FNP  QUEtiapine (SEROQUEL) 200 MG tablet Take 1 tablet (200 mg total) by  mouth at bedtime. 12/25/18  Yes Starkes-Perry, Gayland Curry, FNP  traZODone (DESYREL) 100 MG tablet Take 1 tablet (100 mg total) by mouth at bedtime as needed for sleep. 12/25/18  Yes Starkes-Perry, Gayland Curry, FNP  nicotine (NICODERM CQ - DOSED IN MG/24 HOURS) 21 mg/24hr patch Place 1 patch (21 mg total) onto the skin daily. Patient not taking: Reported on 01/18/2019 12/26/18   Suella Broad, FNP    Family History Family History  Problem Relation Age of Onset  . Diabetes Mother   . Hypertension Mother   . Drug abuse Father   . Schizophrenia Maternal Aunt     Social History Social History   Tobacco Use  . Smoking status: Current Every Day Smoker    Packs/day: 1.00     Types: Cigarettes  . Smokeless tobacco: Never Used  . Tobacco comment: pt reported quiting three weeks ago  Substance Use Topics  . Alcohol use: Yes    Comment: pt denies drinking at this time  . Drug use: Yes    Frequency: 1.0 times per week    Types: Cocaine, Marijuana    Comment: last use 12/06/18     Allergies   Patient has no known allergies.   Review of Systems Review of Systems  Constitutional: Negative for chills, fatigue and fever.  HENT: Positive for congestion and rhinorrhea.   Eyes: Negative for visual disturbance.  Respiratory: Positive for cough, shortness of breath and wheezing.   Cardiovascular: Negative for chest pain and leg swelling.  Gastrointestinal: Negative for abdominal pain, diarrhea, nausea and vomiting.  Genitourinary: Negative for dysuria and flank pain.  Musculoskeletal: Negative for neck pain and neck stiffness.  Skin: Negative for rash and wound.  Allergic/Immunologic: Negative for immunocompromised state.  Neurological: Negative for syncope, weakness and headaches.  All other systems reviewed and are negative.    Physical Exam Updated Vital Signs BP 117/88   Pulse 99   Temp 97.8 F (36.6 C) (Oral)   Resp 20   SpO2 100%   Physical Exam Vitals signs and nursing note reviewed.  Constitutional:      General: She is not in acute distress.    Appearance: She is well-developed.  HENT:     Head: Normocephalic and atraumatic.     Mouth/Throat:     Mouth: Mucous membranes are dry.     Comments: Moderate nasal congestion and rhinorrhea. OP clear. Eyes:     Conjunctiva/sclera: Conjunctivae normal.  Neck:     Musculoskeletal: Neck supple.  Cardiovascular:     Rate and Rhythm: Normal rate and regular rhythm.     Heart sounds: Normal heart sounds. No murmur. No friction rub.  Pulmonary:     Effort: Tachypnea and respiratory distress present.     Breath sounds: Examination of the right-upper field reveals wheezing. Examination of the  left-upper field reveals wheezing. Examination of the right-middle field reveals wheezing. Examination of the left-middle field reveals wheezing. Examination of the right-lower field reveals wheezing. Examination of the left-lower field reveals wheezing. Decreased breath sounds and wheezing present. No rales.  Abdominal:     General: There is no distension.     Palpations: Abdomen is soft.     Tenderness: There is no abdominal tenderness.  Skin:    General: Skin is warm.     Capillary Refill: Capillary refill takes less than 2 seconds.  Neurological:     Mental Status: She is alert and oriented to person, place, and time.  Motor: No abnormal muscle tone.      ED Treatments / Results  Labs (all labs ordered are listed, but only abnormal results are displayed) Labs Reviewed  CBC WITH DIFFERENTIAL/PLATELET - Abnormal; Notable for the following components:      Result Value   RBC 3.73 (*)    Hemoglobin 10.0 (*)    HCT 32.8 (*)    RDW 17.2 (*)    All other components within normal limits  BASIC METABOLIC PANEL  I-STAT BETA HCG BLOOD, ED (MC, WL, AP ONLY)    EKG EKG Interpretation  Date/Time:  Wednesday January 18 2019 07:02:25 EST Ventricular Rate:  102 PR Interval:  142 QRS Duration: 86 QT Interval:  324 QTC Calculation: 422 R Axis:   89 Text Interpretation:  Sinus tachycardia Otherwise normal ECG Sinc elast EKG, rate is increased Otherwise no significant change Confirmed by Duffy Bruce [phone]) on 01/18/2019 7:19:41 AM   Radiology Dg Chest 2 View  Result Date: 01/18/2019 CLINICAL DATA:  Shortness of breath EXAM: CHEST - 2 VIEW COMPARISON:  01/09/2019 FINDINGS: The heart size and mediastinal contours are within normal limits. Both lungs are clear. The visualized skeletal structures are unremarkable. IMPRESSION: No active cardiopulmonary disease. Electronically Signed   By: Inez Catalina M.D.   On: 01/18/2019 07:32    Procedures .Critical Care Performed by: Duffy Bruce, MD Authorized by: Duffy Bruce, MD   Critical care provider statement:    Critical care time (minutes):  45   Critical care time was exclusive of:  Separately billable procedures and treating other patients and teaching time   Critical care was necessary to treat or prevent imminent or life-threatening deterioration of the following conditions:  Respiratory failure, circulatory failure and cardiac failure   Critical care was time spent personally by me on the following activities:  Development of treatment plan with patient or surrogate, discussions with consultants, evaluation of patient's response to treatment, examination of patient, obtaining history from patient or surrogate, ordering and performing treatments and interventions, ordering and review of laboratory studies, ordering and review of radiographic studies, pulse oximetry, re-evaluation of patient's condition and review of old charts   I assumed direction of critical care for this patient from another provider in my specialty: no     (including critical care time)  Medications Ordered in ED Medications  sodium chloride 0.9 % bolus 1,000 mL (1,000 mLs Intravenous New Bag/Given 01/18/19 0827)  0.9 %  sodium chloride infusion (has no administration in time range)  magnesium sulfate IVPB 2 g 50 mL (has no administration in time range)  albuterol (PROVENTIL) (2.5 MG/3ML) 0.083% nebulizer solution 5 mg (5 mg Nebulization Given 01/18/19 5852)  methylPREDNISolone sodium succinate (SOLU-MEDROL) 125 mg/2 mL injection 125 mg (125 mg Intravenous Given 01/18/19 0826)  albuterol (PROVENTIL,VENTOLIN) solution continuous neb (10 mg/hr Nebulization Given 01/18/19 0802)  ipratropium (ATROVENT) nebulizer solution 1 mg (1 mg Nebulization Given 01/18/19 0802)  ketorolac (TORADOL) 15 MG/ML injection 15 mg (15 mg Intravenous Given 01/18/19 0901)  acetaminophen (TYLENOL) tablet 1,000 mg (1,000 mg Oral Given 01/18/19 0900)     Initial Impression /  Assessment and Plan / ED Course  I have reviewed the triage vital signs and the nursing notes.  Pertinent labs & imaging results that were available during my care of the patient were reviewed by me and considered in my medical decision making (see chart for details).  Clinical Course as of Jan 18 918  Wed Jan 18, 2019  7782 42  yo F here with SOB, wheezing. Suspect COPD exacerbation in setting of URI from 24 yo daughter. She does have increased WOB, diffuse wheezing. Will start on steroids, CAT, re-assess. CXR reviewed, no focal PNA noted. No sputum production. EKG non-ischemic. Doubt ACS or PE.   [CI]  0900 Breathing improving on neb but pt has persistent, severe wheezing with increased WOB. She is not drowsy and has no clinical signs of CO2 retention.. Pt with c/o chest wall pain after coughing - tylenol, toradol given. EKG non-ischemic. Doubt ACS, PE, dissection.   [CI]    Clinical Course User Index [CI] Duffy Bruce, MD     Final Clinical Impressions(s) / ED Diagnoses   Final diagnoses:  COPD exacerbation ([place])  SOB (shortness of breath)    ED Discharge Orders    None       Duffy Bruce, MD 01/18/19 [phone]

## 2019-01-18 NOTE — H&P (Signed)
History and Physical  Deborah Melendez BJS:[id number] DOB: 10-19-1976 DOA: 01/18/2019  Referring physician: ER physician PCP: Charlott Rakes, MD  Outpatient Specialists:    Patient coming from: Home  Chief Complaint: Shortness of breath and wheezing.  HPI: Patient is a 43 year old African-American female with past medical history significant for asthma, bipolar disorder, cocaine abuse, depression, suicidal and homicidal ideations as well as prior documented COPD.  According to the patient, she was admitted to the ICU, intubated, about 3 months ago due to respiratory failure.  Patient tells me that she developed shortness of breath and wheezing earlier today that did not respond to the inhalers at home.  Patient has associated fever and cough that is productive of clear sputum.  Patient is wheezing.  On further questioning, patient admits to having URI symptoms about 2 days prior to presentation.  Despite receiving Solu-Medrol and nebulizer treatment in the ER, patient remained significantly dyspneic, tachypneic, with significant wheezing.  No leukocytosis.  Chest x-ray has not shown any acute cardiopulmonary disease.  Patient be admitted for further assessment and management.  ED Course: On presentation, patient was in respiratory distress.  Patient was managed with IV Solu-Medrol and nebulizer treatments, but patient remained significantly short of breath. Pertinent labs: As documented above.  Chest x-ray has not shown any acute changes.  No leukocytosis.  Influenza PCR is still pending. EKG: Independently reviewed.  Sinus tachycardia noted. Imaging: independently reviewed.   Review of Systems:  Negative for visual changes, rash, new muscle aches, chest pain, dysuria, bleeding, n/v/abdominal pain.  Past Medical History:  Diagnosis Date  . Asthma   . Bipolar 1 disorder, depressed, severe ([place]) 02/04/2017  . Cocaine abuse with cocaine-induced mood disorder ([place]) 07/27/2017  . COPD  (chronic obstructive pulmonary disease) ([place])   . Depression   . Homicidal ideations   . Manic behavior ([place])   . MDD (major depressive disorder), recurrent severe, without psychosis ([place]) 08/08/2015  . Substance or medication-induced bipolar and related disorder with onset during intoxication ([place]) 09/08/2016  . Suicidal ideation     Past Surgical History:  Procedure Laterality Date  . FINGER SURGERY       reports that she has been smoking cigarettes. She has been smoking about 1.00 pack per day. She has never used smokeless tobacco. She reports current alcohol use. She reports current drug use. Frequency: 1.00 time per week. Drugs: Cocaine and Marijuana.  No Known Allergies  Family History  Problem Relation Age of Onset  . Diabetes Mother   . Hypertension Mother   . Drug abuse Father   . Schizophrenia Maternal Aunt      Prior to Admission medications   Medication Sig Start Date End Date Taking? Authorizing Provider  acetaminophen (TYLENOL) 500 MG tablet Take 1,000 mg by mouth every 6 (six) hours as needed for mild pain or headache.   Yes [provider]  albuterol (PROVENTIL HFA;VENTOLIN HFA) 108 (90 Base) MCG/ACT inhaler Inhale 2 puffs into the lungs every 6 (six) hours as needed for wheezing or shortness of breath.   Yes [provider]  ferrous sulfate 325 (65 FE) MG tablet Take 325 mg by mouth 3 (three) times daily with meals.   Yes [provider]  FLUoxetine (PROZAC) 40 MG capsule Take 1 capsule (40 mg total) by mouth daily. 12/26/18  Yes Starkes-Perry, Gayland Curry, FNP  QUEtiapine (SEROQUEL) 200 MG tablet Take 1 tablet (200 mg total) by mouth at bedtime. 12/25/18  Yes Starkes-Perry, Gayland Curry, FNP  traZODone (DESYREL) 100 MG tablet Take 1 tablet (100 mg total) by mouth at bedtime as needed for sleep. 12/25/18  Yes Suella Broad, FNP    Physical Exam: Vitals:   01/18/19 0945 01/18/19 1000 01/18/19 1015 01/18/19 1045  BP: 126/72 110/84 111/74  120/87  Pulse: (!) 110 (!) 108 (!) 106 (!) 103  Resp: 16 18 (!) 21 (!) 23  Temp:      TempSrc:      SpO2: 98% 97% 95% 99%   Constitutional:  . Patient is still short of breath and wheezy. Eyes:  . No pallor. No jaundice.  ENMT:  . external ears, nose appear normal Neck:  . Neck is supple. No JVD Respiratory:  . Decreased air entry globally.  Inspiratory and expiratory wheeze. Cardiovascular:  . S1S2, mild tachycardia. . No LE extremity edema   Abdomen:  . Abdomen is soft and non tender. Organs are difficult to assess. Neurologic:  . Awake and alert. . Moves all limbs.  Wt Readings from Last 3 Encounters:  12/08/18 74.8 kg  11/04/18 75.3 kg  10/27/18 67.7 kg    I have personally reviewed following labs and imaging studies  Labs on Admission:  CBC: Recent Labs  Lab 01/18/19 0801  WBC 5.9  NEUTROABS 3.5  HGB 10.0*  HCT 32.8*  MCV 87.9  PLT 505   Basic Metabolic Panel: Recent Labs  Lab 01/18/19 0801  NA 141  K 3.6  CL 111  CO2 21*  GLUCOSE 123*  BUN 6  CREATININE 0.89  CALCIUM 8.7*   Liver Function Tests: No results for input(s): AST, ALT, ALKPHOS, BILITOT, PROT, ALBUMIN in the last 168 hours. No results for input(s): LIPASE, AMYLASE in the last 168 hours. No results for input(s): AMMONIA in the last 168 hours. Coagulation Profile: No results for input(s): INR, PROTIME in the last 168 hours. Cardiac Enzymes: No results for input(s): CKTOTAL, CKMB, CKMBINDEX, TROPONINI in the last 168 hours. BNP (last 3 results) No results for input(s): PROBNP in the last 8760 hours. HbA1C: No results for input(s): HGBA1C in the last 72 hours. CBG: No results for input(s): GLUCAP in the last 168 hours. Lipid Profile: No results for input(s): CHOL, HDL, LDLCALC, TRIG, CHOLHDL, LDLDIRECT in the last 72 hours. Thyroid Function Tests: No results for input(s): TSH, T4TOTAL, FREET4, T3FREE, THYROIDAB in the last 72 hours. Anemia Panel: No results for input(s):  VITAMINB12, FOLATE, FERRITIN, TIBC, IRON, RETICCTPCT in the last 72 hours. Urine analysis:    Component Value Date/Time   COLORURINE YELLOW (A) 10/08/2017 0935   APPEARANCEUR HAZY (A) 10/08/2017 0935   LABSPEC 1.026 10/08/2017 0935   PHURINE 6.0 10/08/2017 0935   GLUCOSEU NEGATIVE 10/08/2017 0935   HGBUR LARGE (A) 10/08/2017 0935   BILIRUBINUR NEGATIVE 10/08/2017 0935   KETONESUR NEGATIVE 10/08/2017 0935   PROTEINUR 30 (A) 10/08/2017 0935   UROBILINOGEN 1.0 08/08/2015 1512   NITRITE NEGATIVE 10/08/2017 0935   LEUKOCYTESUR SMALL (A) 10/08/2017 0935   Sepsis Labs: @LABRCNTIP (procalcitonin:4,lacticidven:4) )No results found for this or any previous visit (from the past 240 hour(s)).    Radiological Exams on Admission: Dg Chest 2 View  Result Date: 01/18/2019 CLINICAL DATA:  Shortness of breath EXAM: CHEST - 2 VIEW COMPARISON:  01/09/2019 FINDINGS: The heart size and mediastinal contours are within normal limits. Both lungs are clear. The visualized skeletal structures are unremarkable. IMPRESSION: No active cardiopulmonary disease. Electronically Signed   By: Inez Catalina M.D.   On: 01/18/2019 07:32  EKG: Independently reviewed.   Active Problems:   SOB (shortness of breath)   Acute asthma exacerbation   Assessment/Plan Acute hypoxic respiratory failure: This likely secondary to acute asthma exacerbation, severe IV Solu-Medrol Neb DuoNeb Singulair 10 mg p.o. once daily Loratadine Nasal swab for influenza  Acute severe asthma exacerbation: See above. Peak flow daily.  Viral syndrome: Nasal swab for viral PCR Tamiflu  History of cocaine abuse: Check UDS.  Bipolar disorder: Currently stable. Monitor closely while on steroids.   Further management depend on hospital course.  DVT prophylaxis: Subcu Lovenox Code Status: Full code Family Communication:  Disposition Plan: Home eventually  Consults called: None Admission status: Observation  Time spent: 65  minutes.   Dana Allan, MD  [facility] Pager #: [phone] 7PM-7AM contact night coverage as above  01/18/2019, 11:41 AM

## 2019-01-18 NOTE — ED Notes (Signed)
Attempted to give report;  Not ready for report

## 2019-01-18 NOTE — ED Triage Notes (Signed)
Per pt she has been having SOB with harder time breathing. Pt has hx asthma copd. Pt has been sick with congestion and cold x 1 week. No distress at this time

## 2019-01-19 DIAGNOSIS — J441 Chronic obstructive pulmonary disease with (acute) exacerbation: Secondary | ICD-10-CM

## 2019-01-19 DIAGNOSIS — R0602 Shortness of breath: Secondary | ICD-10-CM

## 2019-01-19 LAB — COMPREHENSIVE METABOLIC PANEL
ALT: 12 U/L (ref 0–44)
AST: 26 U/L (ref 15–41)
Albumin: 3.3 g/dL — ABNORMAL LOW (ref 3.5–5.0)
Alkaline Phosphatase: 45 U/L (ref 38–126)
Anion gap: 9 (ref 5–15)
BUN: 9 mg/dL (ref 6–20)
CO2: 18 mmol/L — ABNORMAL LOW (ref 22–32)
Calcium: 8.8 mg/dL — ABNORMAL LOW (ref 8.9–10.3)
Chloride: 111 mmol/L (ref 98–111)
Creatinine, Ser: 0.83 mg/dL (ref 0.44–1.00)
GFR calc Af Amer: >60 mL/min (ref >60)
GFR calc non Af Amer: >60 mL/min (ref >60)
Glucose, Bld: 184 mg/dL — ABNORMAL HIGH (ref 70–99)
Potassium: 3.5 mmol/L (ref 3.5–5.1)
Sodium: 138 mmol/L (ref 135–145)
Total Bilirubin: 0.3 mg/dL (ref 0.3–1.2)
Total Protein: 6.6 g/dL (ref 6.5–8.1)

## 2019-01-19 LAB — TROPONIN I: Troponin I: <0.03 ng/mL (ref <0.03)

## 2019-01-19 MED ORDER — BENZONATATE 200 MG PO CAPS: 200.0000 mg | ORAL_CAPSULE | Freq: Three times a day (TID) | ORAL | 0 refills | Status: DC | PRN

## 2019-01-19 MED ORDER — LORATADINE 10 MG PO TABS: 10.0000 mg | ORAL_TABLET | Freq: Every day | ORAL | 0 refills | Status: DC

## 2019-01-19 MED ORDER — PREDNISONE 5 MG PO TABS: ORAL_TABLET | ORAL | 0 refills | Status: DC

## 2019-01-19 MED ORDER — IPRATROPIUM-ALBUTEROL 0.5-2.5 (3) MG/3ML IN SOLN: RESPIRATORY_TRACT | 0 refills | Status: DC

## 2019-01-19 NOTE — Discharge Summary (Signed)
Deborah Melendez SPQ:[id number] DOB: May 22, 1976 DOA: 01/18/2019  PCP: Charlott Rakes, MD  Admit date: 01/18/2019  Discharge date: 01/19/2019  Admitted From: Home   Disposition:  Home   Recommendations for Outpatient Follow-up:   Follow up with PCP in 1-2 weeks  PCP Please obtain BMP/CBC, 2 view CXR in 1week,  (see Discharge instructions)   PCP Please follow up on the following pending results:    Home Health: None   Equipment/Devices: [employer] and Rx  Consultations: None Discharge Condition: Stable   CODE STATUS: Full   Diet Recommendation: Heart Healthy     Chief Complaint  Patient presents with  . Shortness of Breath     Brief history of present illness from the day of admission and additional interim summary    Patient is a 43 year old African-American female with past medical history significant for asthma, bipolar disorder, cocaine abuse, depression, suicidal and homicidal ideations as well as prior documented COPD.  According to the patient, she was admitted to the ICU, intubated, about 3 months ago due to respiratory failure.  Patient tells me that she developed shortness of breath and wheezing earlier today that did not respond to the inhalers at home.                                                                  Hospital Course   Acute hypoxic respiratory failure: 2 mild asthma exacerbation brought on by rhinovirus bronchitis.  He was placed on IV steroids along with nebulizer treatments and oxygen, now close to baseline, ambulated on room air with pulse ox staying above 90%.  Will be placed on oral steroid taper, antiallergic medication along with nebulizer treatments and machine which will be provided for home.  She has been counseled to quit smoking.  Requested to follow with PCP in  the next 7 to 10 days.   History of cocaine abuse: Along with smoking. Counseled to quit both.  Bipolar disorder: Currently stable.    Discharge diagnosis     Active Problems:   Acute respiratory failure with hypoxia (HCC)   SOB (shortness of breath)   Acute asthma exacerbation    Discharge instructions    Discharge Instructions    Diet - low sodium heart healthy   Complete by:  As directed    Discharge instructions   Complete by:  As directed    Follow with Primary MD Charlott Rakes, MD in 7 days   Get CBC, CMP, 2 view Chest X ray -  checked  by Primary MD  in 5-7 days   Activity: As tolerated with Full fall precautions use walker/cane & assistance as needed  Disposition Home   Diet: Heart Healthy   Special Instructions: If you have smoked or chewed Tobacco  in the last 2  yrs please stop smoking, stop any regular Alcohol  and or any Recreational drug use.  On your next visit with your primary care physician please Get Medicines reviewed and adjusted.  Please request your Prim.MD to go over all Hospital Tests and Procedure/Radiological results at the follow up, please get all Hospital records sent to your Prim MD by signing hospital release before you go home.  If you experience worsening of your admission symptoms, develop shortness of breath, life threatening emergency, suicidal or homicidal thoughts you must seek medical attention immediately by calling 911 or calling your MD immediately  if symptoms less severe.  You Must read complete instructions/literature along with all the possible adverse reactions/side effects for all the Medicines you take and that have been prescribed to you. Take any new Medicines after you have completely understood and accpet all the possible adverse reactions/side effects.   Increase activity slowly   Complete by:  As directed       Discharge Medications   Allergies as of 01/19/2019   No Known Allergies     Medication List      TAKE these medications   acetaminophen 500 MG tablet Commonly known as:  TYLENOL Take 1,000 mg by mouth every 6 (six) hours as needed for mild pain or headache.   albuterol 108 (90 Base) MCG/ACT inhaler Commonly known as:  PROVENTIL HFA;VENTOLIN HFA Inhale 2 puffs into the lungs every 6 (six) hours as needed for wheezing or shortness of breath.   benzonatate 200 MG capsule Commonly known as:  TESSALON Take 1 capsule (200 mg total) by mouth 3 (three) times daily as needed for cough.   ferrous sulfate 325 (65 FE) MG tablet Take 325 mg by mouth 3 (three) times daily with meals.   FLUoxetine 40 MG capsule Commonly known as:  PROZAC Take 1 capsule (40 mg total) by mouth daily.   ipratropium-albuterol 0.5-2.5 (3) MG/3ML Soln Commonly known as:  DUONEB Use twice a day scheduled and every 4 hours as needed for shortness of breath and wheezing   loratadine 10 MG tablet Commonly known as:  CLARITIN Take 1 tablet (10 mg total) by mouth daily.   predniSONE 5 MG tablet Commonly known as:  DELTASONE Label  & dispense according to the schedule below. take 8 Pills PO for 3 days, 6 Pills PO for 3 days, 4 Pills PO for 3 days, 2 Pills PO for 3 days, 1 Pills PO for 3 days, 1/2 Pill  PO for 3 days then STOP. Total 65 pills.   QUEtiapine 200 MG tablet Commonly known as:  SEROQUEL Take 1 tablet (200 mg total) by mouth at bedtime.   traZODone 100 MG tablet Commonly known as:  DESYREL Take 1 tablet (100 mg total) by mouth at bedtime as needed for sleep.            Durable Medical Equipment  (From admission, onward)         Start     Ordered   01/19/19 0909  For home use only DME Nebulizer/meds  Once    Question:  Patient needs a nebulizer to treat with the following condition  Answer:  COPD (chronic obstructive pulmonary disease) ([place])   01/19/19 0908          Follow-up Information    Charlott Rakes, MD. Schedule an appointment as soon as possible for a visit in 1 week(s).    Specialty:  Family Medicine Contact information: [address] [place] [address] [phone]  Major procedures and Radiology Reports - PLEASE review detailed and final reports thoroughly  -       Dg Chest 2 View  Result Date: 01/18/2019 CLINICAL DATA:  Shortness of breath EXAM: CHEST - 2 VIEW COMPARISON:  01/09/2019 FINDINGS: The heart size and mediastinal contours are within normal limits. Both lungs are clear. The visualized skeletal structures are unremarkable. IMPRESSION: No active cardiopulmonary disease. Electronically Signed   By: Inez Catalina M.D.   On: 01/18/2019 07:32   Dg Chest 2 View  Result Date: 01/09/2019 CLINICAL DATA:  Progressive shortness of breath over the last 3 hours. History of COPD and asthma. EXAM: CHEST - 2 VIEW COMPARISON:  12/21/2018 FINDINGS: Azygos lobe. Normal heart size and pulmonary vascularity. No focal airspace disease or consolidation in the lungs. No blunting of costophrenic angles. No pneumothorax. Mediastinal contours appear intact. IMPRESSION: No active cardiopulmonary disease. Electronically Signed   By: Lucienne Capers M.D.   On: 01/09/2019 19:11   Dg Chest 2 View  Result Date: 12/21/2018 CLINICAL DATA:  Subacute onset of cough and shortness of breath. EXAM: CHEST - 2 VIEW COMPARISON:  Chest radiograph performed 12/08/2018 FINDINGS: The lungs are well-aerated and clear. There is no evidence of focal opacification, pleural effusion or pneumothorax. An accessory azygos lobe is incidentally noted. The heart is normal in size; the mediastinal contour is within normal limits. No acute osseous abnormalities are seen. IMPRESSION: No acute cardiopulmonary process seen. Electronically Signed   By: Garald Balding M.D.   On: 12/21/2018 02:19    Micro Results     Recent Results (from the past 240 hour(s))  Respiratory Panel by PCR     Status: Abnormal   Collection Time: 01/18/19  4:23 PM  Result Value Ref Range Status    Adenovirus NOT DETECTED NOT DETECTED Final   Coronavirus 229E NOT DETECTED NOT DETECTED Final   Coronavirus HKU1 NOT DETECTED NOT DETECTED Final   Coronavirus NL63 NOT DETECTED NOT DETECTED Final   Coronavirus OC43 NOT DETECTED NOT DETECTED Final   Metapneumovirus NOT DETECTED NOT DETECTED Final   Rhinovirus / Enterovirus DETECTED (A) NOT DETECTED Final   Influenza A NOT DETECTED NOT DETECTED Final   Influenza B NOT DETECTED NOT DETECTED Final   Parainfluenza Virus 1 NOT DETECTED NOT DETECTED Final   Parainfluenza Virus 2 NOT DETECTED NOT DETECTED Final   Parainfluenza Virus 3 NOT DETECTED NOT DETECTED Final   Parainfluenza Virus 4 NOT DETECTED NOT DETECTED Final   Respiratory Syncytial Virus NOT DETECTED NOT DETECTED Final   Bordetella pertussis NOT DETECTED NOT DETECTED Final   Chlamydophila pneumoniae NOT DETECTED NOT DETECTED Final   Mycoplasma pneumoniae NOT DETECTED NOT DETECTED Final    Comment: Performed at [facility], [address] [place], [place] [address]    Today   Subjective    Deborah Melendez today has no headache,no chest abdominal pain,no new weakness tingling or numbness, feels much better wants to go home today.     Objective   Blood pressure (!) 111/58, pulse 95, temperature 98 F (36.7 C), temperature source Oral, resp. rate 18, SpO2 96 %.   Intake/Output Summary (Last 24 hours) at 01/19/2019 0908 Last data filed at 01/18/2019 1019 Gross per 24 hour  Intake 1049.99 ml  Output -  Net 1049.99 ml    Exam  Awake Alert, Oriented x 3, No new F.N deficits, Normal affect [place].AT,PERRAL Supple Neck,No JVD, No cervical lymphadenopathy appriciated.  Symmetrical Chest wall movement, Good air movement  bilaterally,few wheezes RRR,No Gallops,Rubs or new Murmurs, No Parasternal Heave +ve B.Sounds, Abd Soft, Non tender, No organomegaly appriciated, No rebound -guarding or rigidity. No Cyanosis, Clubbing or edema, No new Rash or bruise   Data Review    CBC w Diff:  Lab Results  Component Value Date   WBC 5.9 01/18/2019   HGB 10.0 (L) 01/18/2019   HGB 10.3 (L) 11/04/2018   HCT 32.8 (L) 01/18/2019   HCT 32.0 (L) 11/04/2018   PLT 211 01/18/2019   PLT 255 11/04/2018   LYMPHOPCT 25 01/18/2019   MONOPCT 9 01/18/2019   EOSPCT 6 01/18/2019   BASOPCT 1 01/18/2019    CMP:  Lab Results  Component Value Date   NA 138 01/19/2019   NA 142 11/04/2018   K 3.5 01/19/2019   CL 111 01/19/2019   CO2 18 (L) 01/19/2019   BUN 9 01/19/2019   BUN 12 11/04/2018   CREATININE 0.83 01/19/2019   PROT 6.6 01/19/2019   ALBUMIN 3.3 (L) 01/19/2019   BILITOT 0.3 01/19/2019   ALKPHOS 45 01/19/2019   AST 26 01/19/2019   ALT 12 01/19/2019  .   Total Time in preparing paper work, data evaluation and todays exam - 9 minutes  Lala Lund M.D on 01/19/2019 at 9:08 AM  [facility]   Office  [phone]

## 2019-01-19 NOTE — Progress Notes (Signed)
Nsg Discharge Note  Admit Date:  01/18/2019 Discharge date: 01/19/2019   Deborah Melendez to be D/C'd Home per MD order.  AVS completed.  Copy for chart, and copy for patient signed, and dated. Patient/caregiver able to verbalize understanding.  Discharge Medication: Allergies as of 01/19/2019   No Known Allergies     Medication List    TAKE these medications   acetaminophen 500 MG tablet Commonly known as:  TYLENOL Take 1,000 mg by mouth every 6 (six) hours as needed for mild pain or headache.   albuterol 108 (90 Base) MCG/ACT inhaler Commonly known as:  PROVENTIL HFA;VENTOLIN HFA Inhale 2 puffs into the lungs every 6 (six) hours as needed for wheezing or shortness of breath.   benzonatate 200 MG capsule Commonly known as:  TESSALON Take 1 capsule (200 mg total) by mouth 3 (three) times daily as needed for cough.   ferrous sulfate 325 (65 FE) MG tablet Take 325 mg by mouth 3 (three) times daily with meals.   FLUoxetine 40 MG capsule Commonly known as:  PROZAC Take 1 capsule (40 mg total) by mouth daily.   ipratropium-albuterol 0.5-2.5 (3) MG/3ML Soln Commonly known as:  DUONEB Use twice a day scheduled and every 4 hours as needed for shortness of breath and wheezing   loratadine 10 MG tablet Commonly known as:  CLARITIN Take 1 tablet (10 mg total) by mouth daily.   predniSONE 5 MG tablet Commonly known as:  DELTASONE Label  & dispense according to the schedule below. take 8 Pills PO for 3 days, 6 Pills PO for 3 days, 4 Pills PO for 3 days, 2 Pills PO for 3 days, 1 Pills PO for 3 days, 1/2 Pill  PO for 3 days then STOP. Total 65 pills. Notes to patient:  Tapered dose.   QUEtiapine 200 MG tablet Commonly known as:  SEROQUEL Take 1 tablet (200 mg total) by mouth at bedtime.   traZODone 100 MG tablet Commonly known as:  DESYREL Take 1 tablet (100 mg total) by mouth at bedtime as needed for sleep.            Durable Medical Equipment  (From admission,  onward)         Start     Ordered   01/19/19 0909  For home use only DME Nebulizer/meds  Once    Question:  Patient needs a nebulizer to treat with the following condition  Answer:  COPD (chronic obstructive pulmonary disease) ([place])   01/19/19 0908          Discharge Assessment: Vitals:   01/19/19 0642 01/19/19 0924  BP:    Pulse:    Resp:    Temp:    SpO2: 96% 99%   Skin clean, dry and intact without evidence of skin break down, no evidence of skin tears noted. IV catheter discontinued intact. Site without signs and symptoms of complications - no redness or edema noted at insertion site, patient denies c/o pain - only slight tenderness at site.  Dressing with slight pressure applied.  D/c Instructions-Education: Discharge instructions given to patient/family with verbalized understanding. D/c education completed with patient/family including follow up instructions, medication list, d/c activities limitations if indicated, with other d/c instructions as indicated by MD - patient able to verbalize understanding, all questions fully answered. Patient instructed to return to ED, call 911, or call MD for any changes in condition.  Patient escorted via [place], and D/C home via private auto.  Erasmo Leventhal,  RN 01/19/2019 12:25 PM  Nsg Discharge Note  Admit Date:  01/18/2019 Discharge date: 01/19/2019   Deborah Melendez to be D/C'd Home per MD order.  AVS completed.  Copy for chart, and copy for patient signed, and dated. Patient/caregiver able to verbalize understanding.  Discharge Medication: Allergies as of 01/19/2019   No Known Allergies     Medication List    TAKE these medications   acetaminophen 500 MG tablet Commonly known as:  TYLENOL Take 1,000 mg by mouth every 6 (six) hours as needed for mild pain or headache.   albuterol 108 (90 Base) MCG/ACT inhaler Commonly known as:  PROVENTIL HFA;VENTOLIN HFA Inhale 2 puffs into the lungs every 6 (six) hours as  needed for wheezing or shortness of breath.   benzonatate 200 MG capsule Commonly known as:  TESSALON Take 1 capsule (200 mg total) by mouth 3 (three) times daily as needed for cough.   ferrous sulfate 325 (65 FE) MG tablet Take 325 mg by mouth 3 (three) times daily with meals.   FLUoxetine 40 MG capsule Commonly known as:  PROZAC Take 1 capsule (40 mg total) by mouth daily.   ipratropium-albuterol 0.5-2.5 (3) MG/3ML Soln Commonly known as:  DUONEB Use twice a day scheduled and every 4 hours as needed for shortness of breath and wheezing   loratadine 10 MG tablet Commonly known as:  CLARITIN Take 1 tablet (10 mg total) by mouth daily.   predniSONE 5 MG tablet Commonly known as:  DELTASONE Label  & dispense according to the schedule below. take 8 Pills PO for 3 days, 6 Pills PO for 3 days, 4 Pills PO for 3 days, 2 Pills PO for 3 days, 1 Pills PO for 3 days, 1/2 Pill  PO for 3 days then STOP. Total 65 pills. Notes to patient:  Tapered dose.   QUEtiapine 200 MG tablet Commonly known as:  SEROQUEL Take 1 tablet (200 mg total) by mouth at bedtime.   traZODone 100 MG tablet Commonly known as:  DESYREL Take 1 tablet (100 mg total) by mouth at bedtime as needed for sleep.            Durable Medical Equipment  (From admission, onward)         Start     Ordered   01/19/19 0909  For home use only DME Nebulizer/meds  Once    Question:  Patient needs a nebulizer to treat with the following condition  Answer:  COPD (chronic obstructive pulmonary disease) ([place])   01/19/19 0908          Discharge Assessment: Vitals:   01/19/19 0642 01/19/19 0924  BP:    Pulse:    Resp:    Temp:    SpO2: 96% 99%   Skin clean, dry and intact without evidence of skin break down, no evidence of skin tears noted. IV catheter discontinued intact. Site without signs and symptoms of complications - no redness or edema noted at insertion site, patient denies c/o pain - only slight tenderness at  site.  Dressing with slight pressure applied.  D/c Instructions-Education: Discharge instructions given to patient/family with verbalized understanding. D/c education completed with patient/family including follow up instructions, medication list, d/c activities limitations if indicated, with other d/c instructions as indicated by MD - patient able to verbalize understanding, all questions fully answered. Patient instructed to return to ED, call 911, or call MD for any changes in condition.  Patient escorted via [place], and D/C home via  private auto.  Erasmo Leventhal, RN 01/19/2019 12:25 PM

## 2019-01-19 NOTE — Discharge Instructions (Signed)
Follow with Primary MD Charlott Rakes, MD in 7 days   Get CBC, CMP, 2 view Chest X ray -  checked  by Primary MD  in 5-7 days   Activity: As tolerated with Full fall precautions use walker/cane & assistance as needed  Disposition Home   Diet: Heart Healthy   Special Instructions: If you have smoked or chewed Tobacco  in the last 2 yrs please stop smoking, stop any regular Alcohol  and or any Recreational drug use.  On your next visit with your primary care physician please Get Medicines reviewed and adjusted.  Please request your Prim.MD to go over all Hospital Tests and Procedure/Radiological results at the follow up, please get all Hospital records sent to your Prim MD by signing hospital release before you go home.  If you experience worsening of your admission symptoms, develop shortness of breath, life threatening emergency, suicidal or homicidal thoughts you must seek medical attention immediately by calling 911 or calling your MD immediately  if symptoms less severe.  You Must read complete instructions/literature along with all the possible adverse reactions/side effects for all the Medicines you take and that have been prescribed to you. Take any new Medicines after you have completely understood and accpet all the possible adverse reactions/side effects.

## 2019-02-08 ENCOUNTER — Inpatient Hospital Stay: Payer: Self-pay | Admitting: Critical Care Medicine

## 2019-02-08 NOTE — Progress Notes (Deleted)
   Subjective:    Patient ID: Deborah Melendez, Melendez    DOB: Sep 02, 1976, 43 y.o.   MRN: [id number]  42 y.o.F with Copd exac hx adm 1/22 and d/c 1/23  Previous prior admits with resp failure and intubation Admit date: 01/18/2019  Discharge date: 01/19/2019 14 admits/ED visits in 2019  Admitted From: Home   Disposition:  Home   Recommendations for Outpatient Follow-up:   Follow up with PCP in 1-2 weeks  PCP Please obtain BMP/CBC, 2 view CXR in 1week,  (see Discharge instructions)   PCP Please follow up on the following pending results:    Home Health: None   Equipment/Devices: [employer] and Rx  Consultations: None Discharge Condition: Stable   CODE STATUS: Full   Diet Recommendation: Heart Healthy     Chief Complaint Patient presents with . Shortness of Breath    Brief history of present illness from the day of admission and additional interim summary   Patient is a 43 year old Deborah Melendez with past medical history significant for asthma, bipolar disorder, cocaine abuse, depression, suicidal and homicidal ideations as well as prior documented COPD. According to the patient, she was admitted to the ICU, intubated, about 3 months ago due to respiratory failure. Patient tells me that she developed shortness of breath and wheezing earlier today that did not respond to the inhalers at home.                                                                  Hospital Course  Acute hypoxic respiratory failure: 2 mild asthma exacerbation brought on by rhinovirus bronchitis.  He was placed on IV steroids along with nebulizer treatments and oxygen, now close to baseline, ambulated on room air with pulse ox staying above 90%.  Will be placed on oral steroid taper, antiallergic medication along with nebulizer treatments and machine which will be provided for home.  She has been counseled to quit smoking.  Requested to follow with PCP in the next 7 to 10  days.   History of cocaine abuse: Along with smoking. Counseled to quit both.  Bipolar disorder: Currently stable.    Discharge diagnosis    Active Problems:   Acute respiratory failure with hypoxia (HCC)   SOB (shortness of breath)   Acute asthma exacerbation      Review of Systems     Objective:   Physical Exam        Assessment & Plan:

## 2019-04-18 ENCOUNTER — Other Ambulatory Visit: Payer: Self-pay

## 2019-04-18 ENCOUNTER — Emergency Department (HOSPITAL_COMMUNITY): Payer: Self-pay

## 2019-04-18 ENCOUNTER — Inpatient Hospital Stay (HOSPITAL_COMMUNITY)
Admission: EM | Admit: 2019-04-18 | Discharge: 2019-04-21 | DRG: 812 | Disposition: A | Discharge status: Home / Self Care | Payer: Self-pay | Attending: Internal Medicine | Admitting: Internal Medicine

## 2019-04-18 ENCOUNTER — Encounter (HOSPITAL_COMMUNITY): Payer: Self-pay | Admitting: Emergency Medicine

## 2019-04-18 DIAGNOSIS — F419 Anxiety disorder, unspecified: Secondary | ICD-10-CM | POA: Diagnosis present

## 2019-04-18 DIAGNOSIS — J45901 Unspecified asthma with (acute) exacerbation: Secondary | ICD-10-CM | POA: Diagnosis present

## 2019-04-18 DIAGNOSIS — D649 Anemia, unspecified: Secondary | ICD-10-CM | POA: Diagnosis present

## 2019-04-18 DIAGNOSIS — N920 Excessive and frequent menstruation with regular cycle: Secondary | ICD-10-CM | POA: Diagnosis present

## 2019-04-18 DIAGNOSIS — J398 Other specified diseases of upper respiratory tract: Secondary | ICD-10-CM | POA: Diagnosis present

## 2019-04-18 DIAGNOSIS — F17210 Nicotine dependence, cigarettes, uncomplicated: Secondary | ICD-10-CM | POA: Diagnosis present

## 2019-04-18 DIAGNOSIS — Z7952 Long term (current) use of systemic steroids: Secondary | ICD-10-CM

## 2019-04-18 DIAGNOSIS — E872 Acidosis: Secondary | ICD-10-CM | POA: Diagnosis present

## 2019-04-18 DIAGNOSIS — D500 Iron deficiency anemia secondary to blood loss (chronic): Principal | ICD-10-CM | POA: Diagnosis present

## 2019-04-18 DIAGNOSIS — R195 Other fecal abnormalities: Secondary | ICD-10-CM

## 2019-04-18 DIAGNOSIS — F1420 Cocaine dependence, uncomplicated: Secondary | ICD-10-CM | POA: Diagnosis present

## 2019-04-18 DIAGNOSIS — F319 Bipolar disorder, unspecified: Secondary | ICD-10-CM | POA: Diagnosis present

## 2019-04-18 DIAGNOSIS — J441 Chronic obstructive pulmonary disease with (acute) exacerbation: Secondary | ICD-10-CM | POA: Diagnosis present

## 2019-04-18 DIAGNOSIS — Z79899 Other long term (current) drug therapy: Secondary | ICD-10-CM

## 2019-04-18 LAB — CBC WITH DIFFERENTIAL/PLATELET
Abs Immature Granulocytes: 0.02 10*3/uL (ref 0.00–0.07)
Basophils Absolute: 0 10*3/uL (ref 0.0–0.1)
Basophils Relative: 1 %
Eosinophils Absolute: 0.4 10*3/uL (ref 0.0–0.5)
Eosinophils Relative: 8 %
HCT: 22.4 % — ABNORMAL LOW (ref 36.0–46.0)
Hemoglobin: 5.9 g/dL — CL (ref 12.0–15.0)
Immature Granulocytes: 0 %
Lymphocytes Relative: 30 %
Lymphs Abs: 1.7 10*3/uL (ref 0.7–4.0)
MCH: 19.4 pg — ABNORMAL LOW (ref 26.0–34.0)
MCHC: 26.3 g/dL — ABNORMAL LOW (ref 30.0–36.0)
MCV: 73.7 fL — ABNORMAL LOW (ref 80.0–100.0)
Monocytes Absolute: 0.7 10*3/uL (ref 0.1–1.0)
Monocytes Relative: 12 %
Neutro Abs: 2.9 10*3/uL (ref 1.7–7.7)
Neutrophils Relative %: 49 %
Platelets: 252 10*3/uL (ref 150–400)
RBC: 3.04 MIL/uL — ABNORMAL LOW (ref 3.87–5.11)
RDW: 20 % — ABNORMAL HIGH (ref 11.5–15.5)
WBC: 5.8 10*3/uL (ref 4.0–10.5)
nRBC: 0 % (ref 0.0–0.2)

## 2019-04-18 LAB — COMPREHENSIVE METABOLIC PANEL
ALT: 13 U/L (ref 0–44)
AST: 22 U/L (ref 15–41)
Albumin: 3.7 g/dL (ref 3.5–5.0)
Alkaline Phosphatase: 55 U/L (ref 38–126)
Anion gap: 10 (ref 5–15)
BUN: 10 mg/dL (ref 6–20)
CO2: 19 mmol/L — ABNORMAL LOW (ref 22–32)
Calcium: 8.8 mg/dL — ABNORMAL LOW (ref 8.9–10.3)
Chloride: 110 mmol/L (ref 98–111)
Creatinine, Ser: 0.88 mg/dL (ref 0.44–1.00)
GFR calc Af Amer: >60 mL/min (ref >60)
GFR calc non Af Amer: >60 mL/min (ref >60)
Glucose, Bld: 112 mg/dL — ABNORMAL HIGH (ref 70–99)
Potassium: 3.6 mmol/L (ref 3.5–5.1)
Sodium: 139 mmol/L (ref 135–145)
Total Bilirubin: 0.4 mg/dL (ref 0.3–1.2)
Total Protein: 7.2 g/dL (ref 6.5–8.1)

## 2019-04-18 LAB — PROTIME-INR
INR: 1.1 (ref 0.8–1.2)
Prothrombin Time: 14.1 seconds (ref 11.4–15.2)

## 2019-04-18 LAB — IRON AND TIBC
Iron: 19 ug/dL — ABNORMAL LOW (ref 28–170)
Saturation Ratios: 4 % — ABNORMAL LOW (ref 10.4–31.8)
TIBC: 465 ug/dL — ABNORMAL HIGH (ref 250–450)
UIBC: 446 ug/dL

## 2019-04-18 LAB — RETICULOCYTES
Immature Retic Fract: 9.7 % (ref 2.3–15.9)
RBC.: 2.9 MIL/uL — ABNORMAL LOW (ref 3.87–5.11)
Retic Count, Absolute: 23.2 10*3/uL (ref 19.0–186.0)
Retic Ct Pct: 0.8 % (ref 0.4–3.1)

## 2019-04-18 LAB — APTT: aPTT: 25 seconds (ref 24–36)

## 2019-04-18 LAB — HEMOGLOBIN AND HEMATOCRIT, BLOOD
HCT: 26.2 % — ABNORMAL LOW (ref 36.0–46.0)
Hemoglobin: 7.9 g/dL — ABNORMAL LOW (ref 12.0–15.0)

## 2019-04-18 LAB — FERRITIN: Ferritin: 6 ng/mL — ABNORMAL LOW (ref 11–307)

## 2019-04-18 LAB — PREPARE RBC (CROSSMATCH)

## 2019-04-18 LAB — POC OCCULT BLOOD, ED: Fecal Occult Bld: POSITIVE — AB

## 2019-04-18 MED ORDER — MAGNESIUM SULFATE 2 GM/50ML IV SOLN
2.0000 g | Freq: Once | INTRAVENOUS | Status: AC
  Administered 2019-04-18: 2 g via INTRAVENOUS
  Filled 2019-04-18: qty 50

## 2019-04-18 MED ORDER — ACETAMINOPHEN 325 MG PO TABS
650.0000 mg | ORAL_TABLET | Freq: Four times a day (QID) | ORAL | Status: DC | PRN
  Administered 2019-04-18 – 2019-04-19 (×4): 650 mg via ORAL
  Filled 2019-04-18 (×4): qty 2

## 2019-04-18 MED ORDER — ALBUTEROL SULFATE (2.5 MG/3ML) 0.083% IN NEBU
2.5000 mg | INHALATION_SOLUTION | RESPIRATORY_TRACT | Status: DC | PRN
  Administered 2019-04-18 – 2019-04-19 (×4): 2.5 mg via RESPIRATORY_TRACT
  Filled 2019-04-18 (×5): qty 3

## 2019-04-18 MED ORDER — AEROCHAMBER PLUS FLO-VU LARGE MISC
1.0000 | Freq: Once | Status: AC
  Administered 2019-04-18: 1

## 2019-04-18 MED ORDER — ALBUTEROL SULFATE HFA 108 (90 BASE) MCG/ACT IN AERS
5.0000 | INHALATION_SPRAY | Freq: Once | RESPIRATORY_TRACT | Status: AC
  Administered 2019-04-18: 5 via RESPIRATORY_TRACT

## 2019-04-18 MED ORDER — IPRATROPIUM BROMIDE HFA 17 MCG/ACT IN AERS
2.0000 | INHALATION_SPRAY | RESPIRATORY_TRACT | Status: DC | PRN
  Administered 2019-04-18 – 2019-04-21 (×5): 2 via RESPIRATORY_TRACT
  Filled 2019-04-18: qty 12.9

## 2019-04-18 MED ORDER — SODIUM CHLORIDE 0.9 % IV SOLN
510.0000 mg | Freq: Once | INTRAVENOUS | Status: AC
  Administered 2019-04-18: 16:00:00 510 mg via INTRAVENOUS
  Filled 2019-04-18: qty 17

## 2019-04-18 MED ORDER — BENZONATATE 100 MG PO CAPS
100.0000 mg | ORAL_CAPSULE | Freq: Three times a day (TID) | ORAL | Status: DC | PRN
  Administered 2019-04-18 – 2019-04-19 (×5): 100 mg via ORAL
  Filled 2019-04-18 (×6): qty 1

## 2019-04-18 MED ORDER — IPRATROPIUM BROMIDE HFA 17 MCG/ACT IN AERS
2.0000 | INHALATION_SPRAY | Freq: Once | RESPIRATORY_TRACT | Status: AC
  Administered 2019-04-18: 2 via RESPIRATORY_TRACT
  Filled 2019-04-18: qty 12.9

## 2019-04-18 MED ORDER — ENOXAPARIN SODIUM 40 MG/0.4ML ~~LOC~~ SOLN
40.0000 mg | Freq: Every day | SUBCUTANEOUS | Status: DC
  Administered 2019-04-19 – 2019-04-20 (×2): 40 mg via SUBCUTANEOUS
  Filled 2019-04-18 (×2): qty 0.4

## 2019-04-18 MED ORDER — ONDANSETRON HCL 4 MG PO TABS: 4.0000 mg | ORAL_TABLET | Freq: Four times a day (QID) | ORAL | Status: DC | PRN

## 2019-04-18 MED ORDER — ALBUTEROL SULFATE HFA 108 (90 BASE) MCG/ACT IN AERS
1.0000 | INHALATION_SPRAY | RESPIRATORY_TRACT | Status: DC | PRN
  Filled 2019-04-18: qty 6.7

## 2019-04-18 MED ORDER — LIDOCAINE 5 % EX PTCH
1.0000 | MEDICATED_PATCH | CUTANEOUS | Status: DC
  Administered 2019-04-18 – 2019-04-20 (×3): 1 via TRANSDERMAL
  Filled 2019-04-18 (×3): qty 1

## 2019-04-18 MED ORDER — GUAIFENESIN-DM 100-10 MG/5ML PO SYRP
5.0000 mL | ORAL_SOLUTION | ORAL | Status: DC | PRN
  Administered 2019-04-18 – 2019-04-19 (×6): 5 mL via ORAL
  Filled 2019-04-18 (×6): qty 5

## 2019-04-18 MED ORDER — ALBUTEROL SULFATE HFA 108 (90 BASE) MCG/ACT IN AERS
5.0000 | INHALATION_SPRAY | Freq: Once | RESPIRATORY_TRACT | Status: AC
  Administered 2019-04-18: 5 via RESPIRATORY_TRACT
  Filled 2019-04-18: qty 6.7

## 2019-04-18 MED ORDER — SODIUM CHLORIDE 0.9% IV SOLUTION: Freq: Once | INTRAVENOUS | Status: DC

## 2019-04-18 MED ORDER — PREDNISONE 20 MG PO TABS
50.0000 mg | ORAL_TABLET | Freq: Every day | ORAL | Status: DC
  Administered 2019-04-18 – 2019-04-19 (×2): 50 mg via ORAL
  Filled 2019-04-18 (×2): qty 1

## 2019-04-18 MED ORDER — METHYLPREDNISOLONE SODIUM SUCC 125 MG IJ SOLR
125.0000 mg | Freq: Once | INTRAMUSCULAR | Status: AC
  Administered 2019-04-18: 125 mg via INTRAVENOUS
  Filled 2019-04-18: qty 2

## 2019-04-18 MED ORDER — ACETAMINOPHEN 650 MG RE SUPP: 650.0000 mg | Freq: Four times a day (QID) | RECTAL | Status: DC | PRN

## 2019-04-18 MED ORDER — ONDANSETRON HCL 4 MG/2ML IJ SOLN: 4.0000 mg | Freq: Four times a day (QID) | INTRAMUSCULAR | Status: DC | PRN

## 2019-04-18 NOTE — ED Provider Notes (Addendum)
[facility] EMERGENCY DEPARTMENT Provider Note   CSN: [id number] Arrival date & time: 04/18/19  7062    History   Chief Complaint Chief Complaint  Patient presents with  . Shortness of Breath  . Nausea    HPI Deborah Melendez is a 43 y.o. female.     Patient with a history of polysubstance abuse, depression, bipolar, asthma, COPD in a continuous smoker presents with 3-4 days of SOB, wheezing and cough. She reports a fever at home of 100. She has not taken anything for fever. No nausea. She has post-tussive vomiting only. She reports chest pain with cough. She has a nebulizer machine that stopped working last week. She continues to use inhalers. She states at her baseline she uses her nebulizer multiple times daily.   The history is provided by the patient. No language interpreter was used.  Shortness of Breath  Associated symptoms: cough, fever and wheezing   Associated symptoms: no abdominal pain, no headaches, no rash and no sore throat     Past Medical History:  Diagnosis Date  . Asthma   . Bipolar 1 disorder, depressed, severe ([place]) 02/04/2017  . Cocaine abuse with cocaine-induced mood disorder ([place]) 07/27/2017  . COPD (chronic obstructive pulmonary disease) ([place])   . Depression   . Homicidal ideations   . Manic behavior ([place])   . 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 Suicidal ideation     Patient Active Problem List   Diagnosis Date Noted  . Symptomatic anemia 04/18/2019  . SOB (shortness of breath) 01/18/2019  . Acute asthma exacerbation 01/18/2019  . MDD (major depressive disorder), severe ([place]) 12/21/2018  . Asthma, chronic 12/21/2018  . Bipolar I disorder, most recent episode depressed ([place]) 12/21/2018  . Altered mental status   . Acute respiratory failure with hypoxia ([place])  10/14/2018  . Bronchitis 10/05/2018  . Acute bronchitis with asthma with acute exacerbation 10/04/2018  . Major depressive disorder, recurrent episode with mixed features ([place]) 01/27/2018  . Tobacco use disorder 05/22/2017  . Suicidal ideation   . Cannabis use disorder, severe, dependence ([place]) 01/20/2017  . Cocaine use disorder, severe, dependence ([place]) 08/08/2015    Past Surgical History:  Procedure Laterality Date  . FINGER SURGERY       OB History    Gravida  6   Para  4   Term      Preterm      AB      Living  4     SAB      TAB      Ectopic      Multiple      Live Births  0            Home Medications    Prior to Admission medications   Medication Sig Start Date End Date Taking? Authorizing Provider  acetaminophen (TYLENOL) 500 MG tablet Take 1,000 mg by mouth every 6 (six) hours as needed for mild pain or headache.   Yes [provider]  loratadine (CLARITIN) 10 MG tablet Take 1 tablet (10 mg total) by mouth daily. 01/19/19  Yes Thurnell Lose, MD  albuterol (VENTOLIN HFA) 108 (90 Base) MCG/ACT inhaler Inhale 2 puffs into the lungs every 6 (six) hours as needed for wheezing or shortness of breath. 04/21/19   Isabelle Course, MD  benzonatate (TESSALON) 100 MG capsule  Take 1 capsule (100 mg total) by mouth 3 (three) times daily. 04/21/19   Isabelle Course, MD  ferrous sulfate 325 (65 FE) MG tablet Take 1 tablet (325 mg total) by mouth daily with breakfast. 04/21/19   Isabelle Course, MD  FLUoxetine (PROZAC) 40 MG capsule Take 1 capsule (40 mg total) by mouth daily. 04/21/19   Isabelle Course, MD  guaiFENesin-dextromethorphan (ROBITUSSIN DM) 100-10 MG/5ML syrup Take 10 mLs by mouth every 4 (four) hours while awake. 04/21/19   Isabelle Course, MD  ipratropium-albuterol (DUONEB) 0.5-2.5 (3) MG/3ML SOLN Take 3 mLs by nebulization every 4 (four) hours as needed. 04/21/19   Isabelle Course, MD  predniSONE (DELTASONE) 5 MG tablet Label  & dispense according to  the schedule below. take 8 Pills PO for 1 days, 6 Pills PO for 3 days, 4 Pills PO for 3 days, 2 Pills PO for 3 days, 1 Pills PO for 3 days, 1/2 Pill  PO for 3 days then STOP. Total 49 pills. 04/21/19   Isabelle Course, MD  QUEtiapine (SEROQUEL) 200 MG tablet Take 1 tablet (200 mg total) by mouth at bedtime. 04/21/19   Isabelle Course, MD  traZODone (DESYREL) 100 MG tablet Take 1 tablet (100 mg total) by mouth at bedtime as needed for sleep. 04/21/19   Isabelle Course, MD    Family History Family History  Problem Relation Age of Onset  . Diabetes Mother   . Hypertension Mother   . Drug abuse Father   . Schizophrenia Maternal Aunt     Social History Social History   Tobacco Use  . Smoking status: Current Every Day Smoker    Packs/day: 1.00    Types: Cigarettes  . Smokeless tobacco: Never Used  Substance Use Topics  . Alcohol use: Yes    Comment: pt denies drinking at this time  . Drug use: Yes    Frequency: 1.0 times per week    Types: Cocaine, Marijuana    Comment: last use 12/06/18     Allergies   Patient has no known allergies.   Review of Systems Review of Systems  Constitutional: Positive for fever.  HENT: Negative for congestion and sore throat.   Respiratory: Positive for cough, chest tightness, shortness of breath and wheezing.   Gastrointestinal: Negative for abdominal pain, diarrhea and nausea.  Musculoskeletal: Negative for myalgias.  Skin: Negative for rash.  Neurological: Negative for syncope and headaches.     Physical Exam Updated Vital Signs BP (!) 135/96 (BP Location: Left Arm)   Pulse 95   Temp 98.8 F (37.1 C) (Oral)   Resp 16   Ht 5\' 4"  (1.626 m)   Wt 69 kg   LMP 04/17/2019 (Exact Date)   SpO2 95%   BMI 26.13 kg/m   Physical Exam Vitals signs and nursing note reviewed.  Constitutional:      General: She is not in acute distress.    Appearance: She is well-developed.  HENT:     Head: Normocephalic.  Neck:     Musculoskeletal: Normal  range of motion.  Cardiovascular:     Rate and Rhythm: Normal rate and regular rhythm.     Heart sounds: No murmur.  Pulmonary:     Breath sounds: Wheezing present.     Comments: Expiratory wheezes throughout. Prolonged expirations. Chest:     Chest wall: No tenderness.  Skin:    General: Skin is warm and dry.  Neurological:     Mental  Status: She is alert and oriented to person, place, and time.      ED Treatments / Results  Labs (all labs ordered are listed, but only abnormal results are displayed) Labs Reviewed  CBC WITH DIFFERENTIAL/PLATELET - Abnormal; Notable for the following components:      Result Value   RBC 3.04 (*)    Hemoglobin 5.9 (*)    HCT 22.4 (*)    MCV 73.7 (*)    MCH 19.4 (*)    MCHC 26.3 (*)    RDW 20.0 (*)    All other components within normal limits  COMPREHENSIVE METABOLIC PANEL - Abnormal; Notable for the following components:   CO2 19 (*)    Glucose, Bld 112 (*)    Calcium 8.8 (*)    All other components within normal limits  RETICULOCYTES - Abnormal; Notable for the following components:   RBC. 2.90 (*)    All other components within normal limits  IRON AND TIBC - Abnormal; Notable for the following components:   Iron 19 (*)    TIBC 465 (*)    Saturation Ratios 4 (*)    All other components within normal limits  FERRITIN - Abnormal; Notable for the following components:   Ferritin 6 (*)    All other components within normal limits  URINALYSIS, ROUTINE W REFLEX MICROSCOPIC - Abnormal; Notable for the following components:   Hgb urine dipstick LARGE (*)    Bacteria, UA RARE (*)    All other components within normal limits  HEMOGLOBIN AND HEMATOCRIT, BLOOD - Abnormal; Notable for the following components:   Hemoglobin 7.9 (*)    HCT 26.2 (*)    All other components within normal limits  CBC - Abnormal; Notable for the following components:   WBC 15.1 (*)    RBC 3.28 (*)    Hemoglobin 7.3 (*)    HCT 24.2 (*)    MCV 73.8 (*)    MCH  22.3 (*)    RDW 21.9 (*)    All other components within normal limits  CBC - Abnormal; Notable for the following components:   RBC 3.11 (*)    Hemoglobin 6.9 (*)    HCT 23.5 (*)    MCV 75.6 (*)    MCH 22.2 (*)    MCHC 29.4 (*)    RDW 23.1 (*)    nRBC 0.6 (*)    All other components within normal limits  BASIC METABOLIC PANEL - Abnormal; Notable for the following components:   Potassium 3.4 (*)    CO2 19 (*)    Calcium 8.7 (*)    All other components within normal limits  CBC - Abnormal; Notable for the following components:   WBC 13.6 (*)    RBC 3.48 (*)    Hemoglobin 8.1 (*)    HCT 26.6 (*)    MCV 76.4 (*)    MCH 23.3 (*)    RDW 23.2 (*)    nRBC 2.0 (*)    All other components within normal limits  HEMOGLOBIN AND HEMATOCRIT, BLOOD - Abnormal; Notable for the following components:   Hemoglobin 8.5 (*)    HCT 28.6 (*)    All other components within normal limits  POC OCCULT BLOOD, ED - Abnormal; Notable for the following components:   Fecal Occult Bld POSITIVE (*)    All other components within normal limits  APTT  PROTIME-INR  PREPARE RBC (CROSSMATCH)  TYPE AND SCREEN    EKG EKG: sinus tachycardia, otherwise  NSR   Radiology No results found.  Procedures Procedures (including critical care time) CRITICAL CARE Performed by: Dewaine Oats   Total critical care time: 60 minutes  Critical care time was exclusive of separately billable procedures and treating other patients.  Critical care was necessary to treat or prevent imminent or life-threatening deterioration.  Critical care was time spent personally by me on the following activities: development of treatment plan with patient and/or surrogate as well as nursing, discussions with consultants, evaluation of patient's response to treatment, examination of patient, obtaining history from patient or surrogate, ordering and performing treatments and interventions, ordering and review of laboratory studies,  ordering and review of radiographic studies, pulse oximetry and re-evaluation of patient's condition.  Medications Ordered in ED Medications  albuterol (VENTOLIN HFA) 108 (90 Base) MCG/ACT inhaler 5 puff (5 puffs Inhalation Given 04/18/19 0540)  AeroChamber Plus Flo-Vu Large MISC 1 each (1 each Other Given 04/18/19 0540)  ipratropium (ATROVENT HFA) inhaler 2 puff (2 puffs Inhalation Given 04/18/19 0540)  methylPREDNISolone sodium succinate (SOLU-MEDROL) 125 mg/2 mL injection 125 mg (125 mg Intravenous Given 04/18/19 0615)  magnesium sulfate IVPB 2 g 50 mL (0 g Intravenous Stopped 04/18/19 0726)  albuterol (VENTOLIN HFA) 108 (90 Base) MCG/ACT inhaler 5 puff (5 puffs Inhalation Given 04/18/19 0706)  ipratropium (ATROVENT HFA) inhaler 2 puff (2 puffs Inhalation Given 04/18/19 0706)  AeroChamber Plus Flo-Vu Large MISC 1 each (1 each Other Given 04/18/19 0706)  ferumoxytol (FERAHEME) 510 mg in sodium chloride 0.9 % 100 mL IVPB (0 mg Intravenous Stopped 04/18/19 1636)     Initial Impression / Assessment and Plan / ED Course  I have reviewed the triage vital signs and the nursing notes.  Pertinent labs & imaging results that were available during my care of the patient were reviewed by me and considered in my medical decision making (see chart for details).        Patient to ED with complaint of wheezing and fever at home Tmax 100. She reports her symptoms feel like her asthma. She has not had access to a nebulizer machine for a week.   The patient is put in a negative pressure room in anticipation of nebulized treatments. She is afebrile here without Tylenol or ibuprofen prior to arrival. Her O2 sat is 100% on RA. Given the current pandemic environment, feel she would be appropriately treated with inhalers of Albuterol and Atrovent with use of a spacer. Will give IV mag and solumedrol.   5:15 - Soon after being placed in the room, the patient becomes angry, verbally abusive to staff. She is yelling and  cannot be redirected. Given that she is stable, no sign of hypoxia, yelling in full sentences, will postpone treatment until she is more calm and cooperative. This was explained to the patient.   5:30 - the patient agrees to stop yelling at nursing and providers. O2 saturations remain at 100%. Will start IV and administer medications.   6:00 - Patient feels some better after inhaled medications. IV mag and solumedrol have been given.   Hemoglobin critical value called of 5.9.   Chart reviewed. She received a blood transfusion on admission in November 2019. It cannot be determined from the notes why she required transfusion. She has a diagnosis of anemia of chronic disease. She denies history of GI bleeding, melena or bloody stools. She is currently menstruating.   Hemoccult positive today. Normal stool color.   The patient is updated on labs, and plan for  admission and is agreeable. She has remained polite and cooerative since initial outburst.   Blood transfusion for 2 units ordered. She remains 100% O2 saturation RA, hemodynamically stable.   Discussed with Internal Medicine resident who accepts the patient for admission.  Final Clinical Impressions(s) / ED Diagnoses   Final diagnoses:  Symptomatic anemia  COPD exacerbation (HCC)  Guaiac + stool   1. Symptomatic anemia 2. COPD exacerbation 3. Guaiac positive stool, trace  ED Discharge Orders         Ordered    benzonatate (TESSALON) 100 MG capsule  3 times daily     04/21/19 1008    ferrous sulfate 325 (65 FE) MG tablet  Daily with breakfast     04/21/19 1008    guaiFENesin-dextromethorphan (ROBITUSSIN DM) 100-10 MG/5ML syrup  Every 4 hours while awake     04/21/19 1008    ipratropium-albuterol (DUONEB) 0.5-2.5 (3) MG/3ML SOLN  Every 4 hours PRN     04/21/19 1008    predniSONE (DELTASONE) 5 MG tablet     04/21/19 1008    albuterol (VENTOLIN HFA) 108 (90 Base) MCG/ACT inhaler  Every 6 hours PRN     04/21/19 1008     QUEtiapine (SEROQUEL) 200 MG tablet  Daily at bedtime     04/21/19 1105    traZODone (DESYREL) 100 MG tablet  At bedtime PRN     04/21/19 1105    FLUoxetine (PROZAC) 40 MG capsule  Daily     04/21/19 1105    Increase activity slowly     04/21/19 1159    Diet - low sodium heart healthy     04/21/19 1159    Discharge instructions  Status:  Canceled    Comments:  Ms. Graceanne, Guin were admitted to the hospital because your blood count was very low. We gave you 2 blood transfusions and some IV iron. We also treated you for an asthma exacerbation. It will be important for you to follow up with your regular doctor and continue working on getting medicaid or some other type of financial assistance.   For your anemia (low blood count) - take one iron pill every day with breakfast - we have scheduled you an appointment with your regular doctor on 5/5 - you will need repeat blood work at this appointment   For your asthma: - use your inhaler and nebulizer as needed for shortness of breath/wheezing - when you get short of breath and start coughing at home, try breathing with pursed lips (like you're whistling)  For your chronic cough and voice hoarseness: - follow up with your regular doctor and ask if they can refer you to a lung specialist (Pulmonologist) - you need additional testing to figure out what's causing your cough and voice change and then figure out the best way to treat it - I have given you cough medicine to use at home  Come back to the hospital if you experience severe shortness of breath, fevers, worsening productive cough.   04/21/19 1159    Discharge instructions    Comments:  Ms. Ivori, Storr were admitted to the hospital because your blood count was very low. We gave you 2 blood transfusions and some IV iron. We also treated you for an asthma exacerbation. It will be important for you to follow up with your regular doctor and continue working on getting medicaid or  some other type of financial assistance.   For your anemia (low blood  count) - take one iron pill every day with breakfast - we have scheduled you an appointment with your regular doctor on 5/5 - you will need repeat blood work at this appointment   For your asthma: - take prednisone as prescribed. You will decrease the number of pills every couple days.  - use your inhaler and nebulizer as needed for shortness of breath/wheezing - when you get short of breath and start coughing at home, try breathing with pursed lips (like you're whistling) - ask your doctor about any additional inhalers you can be on  For your chronic cough and voice hoarseness: - follow up with your regular doctor and ask if they can refer you to a lung specialist (Pulmonologist) - you need additional testing to figure out what's causing your cough and voice change and then figure out the best way to treat it - I have given you cough medicine to use at home  Come back to the hospital if you experience severe shortness of breath, fevers, worsening productive cough.   04/21/19 [place], Delania Ferg, PA-C 04/18/19 0716    Ward, Delice Bison, DO 04/18/19 0716    Charlann Lange, PA-C 05/17/19 0358    Ward, Delice Bison, DO 05/18/19 6546

## 2019-04-18 NOTE — H&P (Addendum)
Date: 04/18/2019               Patient Name:  Deborah Melendez MRN: [id number]  DOB: 1976-03-15 Age / Sex: 43 y.o., female   PCP: Charlott Rakes, MD         Medical Service: Internal Medicine Teaching Service         Attending Physician: Dr. Bartholomew Crews, MD    First Contact: Dr. Donne Hazel Pager: [phone]  Second Contact: Dr. Trilby Drummer Pager: [phone]       After Hours (After 5p/  First Contact Pager: [phone]  weekends / holidays): Second Contact Pager: [phone]   Chief Complaint: SHOB  History of Present Illness: Deborah Melendez is a 43 y.o female with asthma/COPD and polysubstance abuse (crack cocaine), who presented to the emergency department with two days of progressive shortness of breath and cough. She states that two days ago she smoked some crack cocaine and began to feel short of breath with persistent productive cough (clear sputum). She attempted to use her nebulizer machine however realized that it was broken. She then tried to manage her symptoms with just her albuterol inhaler; however, noted that her shortness of breath continued to get worse. She then decided to come to the emergency department for further evaluation. Of note she does state that her four-year-old daughter has a "cold" but she denies fevers, chills, headaches, abdominal pain, myalgias/arthralgias, rash, sore throat. She does state that she had nonbloody emesis yesterday after an episode of severe coughing. She also describes one episode of loose stools. She denies any further diarrhea or nausea/vomiting.  She has noticed dark brown stools for several months but denies black or tarry consistency. She states that she is currently menstruating and that her cycles tend to be heavy. She states that she bleeds for 3 to 4 days and goes through 3 to 4 pads per day. She acknowledges that she is supposed to be on iron supplementation for chronic anemia but has not been taking it. She has not noticed any  overt bleeding from her G.I. tract. She has not noticed any hematuria. She does not use excessive NSAIDs. She does smoke and drink alcohol. She denies abdominal pain.  Meds: Denies the use of listed medications aside from her inhalers.  No current facility-administered medications on file prior to encounter.    Current Outpatient Medications on File Prior to Encounter  Medication Sig Dispense Refill  . acetaminophen (TYLENOL) 500 MG tablet Take 1,000 mg by mouth every 6 (six) hours as needed for mild pain or headache.    . albuterol (PROVENTIL HFA;VENTOLIN HFA) 108 (90 Base) MCG/ACT inhaler Inhale 2 puffs into the lungs every 6 (six) hours as needed for wheezing or shortness of breath.    . benzonatate (TESSALON) 200 MG capsule Take 1 capsule (200 mg total) by mouth 3 (three) times daily as needed for cough. 20 capsule 0  . ferrous sulfate 325 (65 FE) MG tablet Take 325 mg by mouth 3 (three) times daily with meals.    Marland Kitchen FLUoxetine (PROZAC) 40 MG capsule Take 1 capsule (40 mg total) by mouth daily. 30 capsule 0  . ipratropium-albuterol (DUONEB) 0.5-2.5 (3) MG/3ML SOLN Use twice a day scheduled and every 4 hours as needed for shortness of breath and wheezing 360 mL 0  . loratadine (CLARITIN) 10 MG tablet Take 1 tablet (10 mg total) by mouth daily. 30 tablet 0  . predniSONE (DELTASONE) 5 MG tablet Label  & dispense according  to the schedule below. take 8 Pills PO for 3 days, 6 Pills PO for 3 days, 4 Pills PO for 3 days, 2 Pills PO for 3 days, 1 Pills PO for 3 days, 1/2 Pill  PO for 3 days then STOP. Total 65 pills. 65 tablet 0  . QUEtiapine (SEROQUEL) 200 MG tablet Take 1 tablet (200 mg total) by mouth at bedtime. 30 tablet 0  . traZODone (DESYREL) 100 MG tablet Take 1 tablet (100 mg total) by mouth at bedtime as needed for sleep. 30 tablet 0   Allergies: Allergies as of 04/18/2019  . (No Known Allergies)   Past Medical History:  Diagnosis Date  . Asthma   . Bipolar 1 disorder, depressed,  severe ([place]) 02/04/2017  . Cocaine abuse with cocaine-induced mood disorder ([place]) 07/27/2017  . COPD (chronic obstructive pulmonary disease) ([place])   . Depression   . Homicidal ideations   . Manic behavior ([place])   . MDD (major depressive disorder), recurrent severe, without psychosis ([place]) 08/08/2015  . Substance or medication-induced bipolar and related disorder with onset during intoxication ([place]) 09/08/2016  . Suicidal ideation    Family History: Denies a family history of hemoglobinopathies, malignancies, hypertension, diabetes.  Social History: Married. Lives with her spouse and four-year-old daughter. Moved to [place] eight years ago. Originally from [place]. Describes binge smoking and drinking. States that she smokes crack cocaine, last use 2 days ago. She denies the use of IV drugs.  Review of Systems: A complete ROS was negative except as per HPI.   Physical Exam: Blood pressure 116/81, pulse (!) 105, temperature 97.8 F (36.6 C), temperature source Oral, resp. rate 20, height 5\' 4"  (1.626 m), weight 72.6 kg, last menstrual period 04/17/2019, SpO2 100 %.  General: Disheveled female in no acute distress HENT: Normocephalic, atraumatic, moist mucus membranes Pulm: Good air movement with no wheezing or crackles  CV: Tachycardic but regular rhythm, no murmurs, no rubs  Abdomen: Active bowel sounds, soft, non-distended, no tenderness to palpation  Extremities: Pulses palpable in all extremities, no LE edema  Skin: Warm and dry, multiple excoriations on the face, back, and arms  Neuro: Alert and oriented x 3  CXR: personally reviewed my interpretation is good inspiration, penetration, and rotation. Radiopaque rod in the left upper arm that is likely a form of birth control. No other bony or soft tissue abnormalities. Increased interstitial markings and vascular congestion. No infiltrates or opacities.  Assessment & Plan by Problem: Principal Problem:   Symptomatic anemia Active  Problems:   Cocaine use disorder, severe, dependence (HCC)   Acute asthma exacerbation  Deborah Melendez is a 43 y.o female with asthma/COPD and polysubstance abuse (crack cocaine), who presented to the emergency department with two days of progressive shortness of breath and cough, found to have a hemoglobin of 5.9 down from a baseline of approximately 10. She was subsequently admitted for further evaluation and management.  Symptomatic Anemia  Iron Deficiency Anemia  - Presenting with SHOB, fatigue, and Hgb of 5.9 from a baseline of ~10  - Low MCH, MCV, and elevated RDW consistent with iron deficiency anemia but will check iron studies.  - No evidence of hemolysis with normal bilirubin and platelets - No signs or symptoms of GI bleed (black/tarry stools, hematemesis, elevated BUN, BRBPR) but is at risk with crack cocaine, EtOH, and tobacco use and would benefit from outpatient endoscopy to better assess her IDA - Patient is currently menstruating and this is likely acute on chronic blood loss  -  Transfuse 2 units pRBCs  - Check reticulocyte count  - Restart Ferrous sulfate 325 mg QD  - Regular diet  Asthma Exacerbation  - Patient is hemodynamically stable but is tachypneic  - High risk with recent hospitalization requiring intubation so we will admit to progressive care unit  - Albuterol and ipratropium prn for wheezing and SHOB  - Prednisone 40 mg QD  - No indication for Abx. Likely exacerbated in the setting of crack cocaine use and broken nebulizer  - Although she has sick contact I do not have a strong suspicion for COVID-19   Non-anion Gap Metabolic Acidosis  - Bicarb slightly decreased at 19, does not appear to be chronic  - Depending on the duration of her tachypnea this may be metabolic compensation for a respiratory alkalosis  - Check VBG, UA, and monitor   Substance Use Disorder  - Crack cocaine, last used 2 days ago  - Monitor for withdrawals.   Diet: VTE ppx: Lovenox  Code Status: Full Code  Dispo: Admit patient to Inpatient with expected length of stay greater than 2 midnights.  Signed: Ina Homes, MD 04/18/2019, 8:38 AM

## 2019-04-18 NOTE — ED Triage Notes (Signed)
Pt reports feeling SOB for the last several days due to what pt reports is an asthma exacerbation. Pt reports her daughter came home with a cold and now she believes she has it.

## 2019-04-18 NOTE — ED Notes (Signed)
ED TO INPATIENT HANDOFF REPORT  ED Nurse Name and Phone #: Barbie Banner, RN -[phone]  S Name/Age/Gender Deborah Melendez 43 y.o. female Room/Bed: 024C/024C  Code Status   Code Status: Full Code  Home/SNF/Other Home Patient oriented to: self, place, time and situation Is this baseline? Yes   Triage Complete: Triage complete  Chief Complaint sob  Triage Note Pt reports feeling SOB for the last several days due to what pt reports is an asthma exacerbation. Pt reports her daughter came home with a cold and now she believes she has it.    Allergies No Known Allergies  Level of Care/Admitting Diagnosis ED Disposition    ED Disposition Condition [facility]: [facility] [[id number]]  Level of Care: Progressive [102]  Covid Evaluation: N/A  Diagnosis: Symptomatic anemia [0932355]  Admitting Physician: Aldine Contes [[id number]]  Attending Physician: Aldine Contes [phone]  Estimated length of stay: past midnight tomorrow  Certification:: I certify this patient will need inpatient services for at least 2 midnights  PT Class (Do Not Modify): Inpatient [101]  PT Acc Code (Do Not Modify): Private [1]       B Medical/Surgery History Past Medical History:  Diagnosis Date  . Asthma   . Bipolar 1 disorder, depressed, severe ([place]) 02/04/2017  . Cocaine abuse with cocaine-induced mood disorder ([place]) 07/27/2017  . COPD (chronic obstructive pulmonary disease) ([place])   . Depression   . Homicidal ideations   . Manic behavior ([place])   . MDD (major depressive disorder), recurrent severe, without psychosis ([place]) 08/08/2015  . Substance or medication-induced bipolar and related disorder with onset during intoxication ([place]) 09/08/2016  . Suicidal ideation    Past Surgical History:  Procedure Laterality Date  . FINGER SURGERY       A IV Location/Drains/Wounds Patient Lines/Drains/Airways Status   Active Line/Drains/Airways    Name:    Placement date:   Placement time:   Site:   Days:   Peripheral IV 04/18/19 Right Antecubital   04/18/19    7628    Antecubital   less than 1          Intake/Output Last 24 hours  Intake/Output Summary (Last 24 hours) at 04/18/2019 0815 Last data filed at 04/18/2019 3151 Gross per 24 hour  Intake 46.57 ml  Output -  Net 46.57 ml    Labs/Imaging Results for orders placed or performed during the hospital encounter of 04/18/19 (from the past 48 hour(s))  CBC with Differential     Status: Abnormal   Collection Time: 04/18/19  5:13 AM  Result Value Ref Range   WBC 5.8 4.0 - 10.5 K/uL   RBC 3.04 (L) 3.87 - 5.11 MIL/uL   Hemoglobin 5.9 (LL) 12.0 - 15.0 g/dL    Comment: Reticulocyte Hemoglobin testing may be clinically indicated, consider ordering this additional test [id number] THIS CRITICAL RESULT HAS VERIFIED AND BEEN CALLED TO T.JORDAN,RN BY BONNIE DAVIS ON 04 21 2020 AT 0624, AND HAS BEEN READ BACK.     HCT 22.4 (L) 36.0 - 46.0 %   MCV 73.7 (L) 80.0 - 100.0 fL   MCH 19.4 (L) 26.0 - 34.0 pg   MCHC 26.3 (L) 30.0 - 36.0 g/dL   RDW 20.0 (H) 11.5 - 15.5 %   Platelets 252 150 - 400 K/uL   nRBC 0.0 0.0 - 0.2 %   Neutrophils Relative % 49 %   Neutro Abs 2.9 1.7 - 7.7 K/uL   Lymphocytes Relative 30 %  Lymphs Abs 1.7 0.7 - 4.0 K/uL   Monocytes Relative 12 %   Monocytes Absolute 0.7 0.1 - 1.0 K/uL   Eosinophils Relative 8 %   Eosinophils Absolute 0.4 0.0 - 0.5 K/uL   Basophils Relative 1 %   Basophils Absolute 0.0 0.0 - 0.1 K/uL   Immature Granulocytes 0 %   Abs Immature Granulocytes 0.02 0.00 - 0.07 K/uL    Comment: Performed at [facility], [place] [address] [place], [place] [address]  Comprehensive metabolic panel     Status: Abnormal   Collection Time: 04/18/19  5:13 AM  Result Value Ref Range   Sodium 139 135 - 145 mmol/L   Potassium 3.6 3.5 - 5.1 mmol/L   Chloride 110 98 - 111 mmol/L   CO2 19 (L) 22 - 32 mmol/L   Glucose, Bld 112 (H) 70 - 99 mg/dL   BUN 10 6 - 20  mg/dL   Creatinine, Ser 0.88 0.44 - 1.00 mg/dL   Calcium 8.8 (L) 8.9 - 10.3 mg/dL   Total Protein 7.2 6.5 - 8.1 g/dL   Albumin 3.7 3.5 - 5.0 g/dL   AST 22 15 - 41 U/L   ALT 13 0 - 44 U/L   Alkaline Phosphatase 55 38 - 126 U/L   Total Bilirubin 0.4 0.3 - 1.2 mg/dL   GFR calc non Af Amer >60 >60 mL/min   GFR calc Af Amer >60 >60 mL/min   Anion gap 10 5 - 15    Comment: Performed at [facility], [place] [address] [place], [place] [address]  APTT     Status: None   Collection Time: 04/18/19  6:45 AM  Result Value Ref Range   aPTT 25 24 - 36 seconds    Comment: Performed at [facility] [address] [place], [place] [address]  Protime-INR     Status: None   Collection Time: 04/18/19  6:45 AM  Result Value Ref Range   Prothrombin Time 14.1 11.4 - 15.2 seconds   INR 1.1 0.8 - 1.2    Comment: (NOTE) INR goal varies based on device and disease states. Performed at [facility], [place] [address] [place], [place] [address]   POC occult blood, ED Provider will collect     Status: Abnormal   Collection Time: 04/18/19  7:13 AM  Result Value Ref Range   Fecal Occult Bld POSITIVE (A) NEGATIVE   Dg Chest Portable 1 View  Result Date: 04/18/2019 CLINICAL DATA:  43 year old female with shortness of breath. EXAM: PORTABLE CHEST 1 VIEW COMPARISON:  01/18/2019 and earlier. FINDINGS: Portable AP upright view at 0506 hours. Lung volumes and mediastinal contours are within normal limits. Allowing for portable technique the lungs are clear. Azygos fissure (normal variant). Visualized tracheal air column is within normal limits. No osseous abnormality identified. IMPRESSION: Negative portable chest. Electronically Signed   By: Genevie Ann M.D.   On: 04/18/2019 05:35    Pending Labs Unresulted Labs (From admission, onward)    Start     Ordered   04/18/19 0748  Reticulocytes  Add-on,   R     04/18/19 0750   04/18/19 0748  Iron and TIBC  Add-on,   R     04/18/19 0750   04/18/19 0748   Ferritin  Add-on,   R     04/18/19 0750   04/18/19 0645  Prepare RBC  (Emergency Blood Administration - Red Blood Cells)  Once,  STAT    Question Answer Comment  # of Units 2 units   Transfusion Indications Symptomatic Anemia      04/18/19 0645          Vitals/Pain Today's Vitals   04/18/19 0451 04/18/19 0452 04/18/19 0715 04/18/19 0731  BP:  119/69 116/81   Pulse:  (!) 106 (!) 105   Resp:  20    Temp:  97.8 F (36.6 C)    TempSrc:  Oral    SpO2:  100% 99% 100%  Weight:      Height:      PainSc: 0-No pain       Isolation Precautions No active isolations  Medications Medications  0.9 %  sodium chloride infusion (Manually program via Guardrails IV Fluids) (has no administration in time range)  albuterol (VENTOLIN HFA) 108 (90 Base) MCG/ACT inhaler 1-2 puff (has no administration in time range)  ipratropium (ATROVENT HFA) inhaler 2 puff (has no administration in time range)  benzonatate (TESSALON) capsule 100 mg (has no administration in time range)  predniSONE (DELTASONE) tablet 50 mg (has no administration in time range)  enoxaparin (LOVENOX) injection 40 mg (has no administration in time range)  acetaminophen (TYLENOL) tablet 650 mg (has no administration in time range)    Or  acetaminophen (TYLENOL) suppository 650 mg (has no administration in time range)  ondansetron (ZOFRAN) tablet 4 mg (has no administration in time range)    Or  ondansetron (ZOFRAN) injection 4 mg (has no administration in time range)  albuterol (VENTOLIN HFA) 108 (90 Base) MCG/ACT inhaler 5 puff (5 puffs Inhalation Given 04/18/19 0540)  AeroChamber Plus Flo-Vu Large MISC 1 each (1 each Other Given 04/18/19 0540)  ipratropium (ATROVENT HFA) inhaler 2 puff (2 puffs Inhalation Given 04/18/19 0540)  methylPREDNISolone sodium succinate (SOLU-MEDROL) 125 mg/2 mL injection 125 mg (125 mg Intravenous Given 04/18/19 0615)  magnesium sulfate IVPB 2 g 50 mL (0 g Intravenous Stopped 04/18/19 0726)  albuterol  (VENTOLIN HFA) 108 (90 Base) MCG/ACT inhaler 5 puff (5 puffs Inhalation Given 04/18/19 0706)  ipratropium (ATROVENT HFA) inhaler 2 puff (2 puffs Inhalation Given 04/18/19 0706)  AeroChamber Plus Flo-Vu Large MISC 1 each (1 each Other Given 04/18/19 0706)    Mobility walks Low fall risk   Focused Assessments Pulmonary Assessment Handoff:  Lung sounds: Bilateral Breath Sounds: Clear L Breath Sounds: Clear R Breath Sounds: Clear O2 Device: Room Air        R Recommendations: See Admitting Provider Note  Report given to: 6E  Additional Notes:  Blood ordered but not ready yet.

## 2019-04-18 NOTE — ED Notes (Signed)
Called in attempt to give report.

## 2019-04-19 DIAGNOSIS — D500 Iron deficiency anemia secondary to blood loss (chronic): Principal | ICD-10-CM

## 2019-04-19 DIAGNOSIS — J45901 Unspecified asthma with (acute) exacerbation: Secondary | ICD-10-CM

## 2019-04-19 DIAGNOSIS — F99 Mental disorder, not otherwise specified: Secondary | ICD-10-CM

## 2019-04-19 DIAGNOSIS — Z9889 Other specified postprocedural states: Secondary | ICD-10-CM

## 2019-04-19 DIAGNOSIS — F1410 Cocaine abuse, uncomplicated: Secondary | ICD-10-CM

## 2019-04-19 LAB — URINALYSIS, ROUTINE W REFLEX MICROSCOPIC
Bilirubin Urine: NEGATIVE
Glucose, UA: NEGATIVE mg/dL
Ketones, ur: NEGATIVE mg/dL
Leukocytes,Ua: NEGATIVE
Nitrite: NEGATIVE
Protein, ur: NEGATIVE mg/dL
Specific Gravity, Urine: 1.008 (ref 1.005–1.030)
pH: 6 (ref 5.0–8.0)

## 2019-04-19 LAB — CBC
HCT: 24.2 % — ABNORMAL LOW (ref 36.0–46.0)
Hemoglobin: 7.3 g/dL — ABNORMAL LOW (ref 12.0–15.0)
MCH: 22.3 pg — ABNORMAL LOW (ref 26.0–34.0)
MCHC: 30.2 g/dL (ref 30.0–36.0)
MCV: 73.8 fL — ABNORMAL LOW (ref 80.0–100.0)
Platelets: 195 10*3/uL (ref 150–400)
RBC: 3.28 MIL/uL — ABNORMAL LOW (ref 3.87–5.11)
RDW: 21.9 % — ABNORMAL HIGH (ref 11.5–15.5)
WBC: 15.1 10*3/uL — ABNORMAL HIGH (ref 4.0–10.5)
nRBC: 0.1 % (ref 0.0–0.2)

## 2019-04-19 MED ORDER — QUETIAPINE FUMARATE 50 MG PO TABS
200.0000 mg | ORAL_TABLET | Freq: Every day | ORAL | Status: DC
  Administered 2019-04-19 – 2019-04-20 (×2): 200 mg via ORAL
  Filled 2019-04-19 (×2): qty 4

## 2019-04-19 MED ORDER — PREDNISONE 20 MG PO TABS
40.0000 mg | ORAL_TABLET | Freq: Every day | ORAL | Status: DC
  Administered 2019-04-20 – 2019-04-21 (×2): 40 mg via ORAL
  Filled 2019-04-19 (×2): qty 2

## 2019-04-19 MED ORDER — TRAZODONE HCL 100 MG PO TABS
100.0000 mg | ORAL_TABLET | Freq: Every evening | ORAL | Status: DC | PRN
  Administered 2019-04-19 – 2019-04-20 (×2): 100 mg via ORAL
  Filled 2019-04-19 (×2): qty 1

## 2019-04-19 MED ORDER — FLUOXETINE HCL 20 MG PO CAPS
40.0000 mg | ORAL_CAPSULE | Freq: Every day | ORAL | Status: DC
  Administered 2019-04-19 – 2019-04-21 (×3): 40 mg via ORAL
  Filled 2019-04-19 (×3): qty 2

## 2019-04-19 MED ORDER — IPRATROPIUM-ALBUTEROL 0.5-2.5 (3) MG/3ML IN SOLN
3.0000 mL | Freq: Four times a day (QID) | RESPIRATORY_TRACT | Status: DC
  Administered 2019-04-19 – 2019-04-21 (×6): 3 mL via RESPIRATORY_TRACT
  Filled 2019-04-19 (×8): qty 3

## 2019-04-19 NOTE — Progress Notes (Signed)
Patient states feeling much better and is able to cough up all the mucus that is in her chest.  I have provided more robitussim and Tessalon for her cough.  I will keep monitoring patient.

## 2019-04-19 NOTE — Progress Notes (Signed)
Subjective:   Overnight: Difficulty with cough productive of green sputum. Oxygenation remained in the mid to upper 90s and symptoms improved with cough medicines and breathing treatments.   Deborah Melendez was examined at bedside and reports that she feels tired, hot, cough with green sputum, congestion, shortness of breath. She gets anxious whenever she feels shortness of breath because she does not want to be re-intubated. She was very tearful during our assessment as she reported of feeling sad, embarrassed and isolated each time she recalls her friends and family members not wanting to be close to her due to the chronic cough. Her chronic cough and hoarseness has never improved ever since she was intubated. She takes prozac, seroquel, trazodone. She has not followed up with Behavioral Health recently but has followed up with a mental health professional/facility in [place]. We notified her there is possibility that we will perform a PFT in-house. All her concerns were properly addressed. She does not recall the last time she took iron supplements. At times when she feels depressed, she does not have the desire to take her medications or willingness to be active.   Objective:  Vital signs in last 24 hours: Vitals:   04/18/19 1231 04/18/19 1550 04/18/19 1820 04/18/19 1922  BP: (!) 116/97 (!) 143/93 133/90 128/90  Pulse: (!) 105 (!) 102 100 (!) 105  Resp: 20 20    Temp: 97.7 F (36.5 C) 98.7 F (37.1 C)  98.9 F (37.2 C)  TempSrc: Axillary Oral  Oral  SpO2: 100% 100% 100% 98%  Weight:      Height:       Const: Tearful, labile mood,  Resp: CTAB, no wheezes, crackles, rhonchi, normal wob on RA CV: RRR, no MGR  Assessment/Plan:  Principal Problem:   Symptomatic anemia Active Problems:   Cocaine use disorder, severe, dependence (HCC)   Acute asthma exacerbation  Deborah Melendez is a 43 y.o female with asthma/COPD and polysubstance abuse (crack cocaine), who presented to the  emergency department with two days of progressive shortness of breath and cough, found to have a hemoglobin of 5.9 down from a baseline of approximately 10. She was subsequently admitted for further evaluation and management.  Symptomatic Anemia  Iron Deficiency Anemia  - on admission, hemoglobin 5.9, iron 19, ferritin 6, calculated iron deficit of 1179mg  - s/p 1U PRBCs and 510mg  IV iron yesterday which should total to roughly 750mg  iron - hemoglobin 7.3 this morning - menstrual bleeding is slowing down - start Ferrous sulfate 325 mg QD  - recheck cbc tomorrow   Asthma Exacerbation, chronic cough - intermittent productive coughing spells with normal oxygenation on room air. No wheezing on lung exam this morning.  - Will get PFTs today since there are none in our system. Also concerned for tracheomalacia given chronic cough/dyspnea after prolonged intubation.   - Albuterol and ipratropium prn for wheezing and SHOB  - Prednisone taper. S/p 2 day sof 50mg . Will decrease to 40mg  tomorrow  - consult care management for nebulizer machine   Bipolar 1 disorder: behavior health admission in Dec 2019 for depression and SI. Reports intermittent compliance with meds at home depending on her mood. She follows with behavioral health outpatient but has not had the motivation to go to her appointments recently. She has a therapist who she likes and expresses intent to f/u with them after this admission. She denies current SI or HI - resume home prozac, seroquel, and prn trazodone   Substance Use Disorder  -  Crack cocaine, last used 4/19  - No withdrawal symptoms overnight. Continue monitoring.    Dispo: Anticipated discharge in approximately 1-2 day(s).   Isabelle Course, MD 04/19/2019, 6:49 AM Pager: [phone]

## 2019-04-19 NOTE — Progress Notes (Signed)
Date: 04/19/2019  Patient name: Deborah Melendez  Medical record number: [id number]  Date of birth: [birth year]   I have seen and evaluated Deborah Melendez and discussed their care with the Residency Team.  Deborah Melendez is a 43 year old woman with a self-reported history of asthma/COPD.  She has never had PFTs.  She has had 2 days of progressive dyspnea and cough which started after smoking crack cocaine.  Her nebulizer machine was broken.  Additionally, her daughter who is 45 years old has had a cold recently.  She also complains of chronic coughing spells since her ICU admission in October.  She also states that she has had significant mental anxiety regarding her breathing since her ICU admission.  She has a mental health provider group but has not gone recently.  In the ED, she did have wheezing but it resolved quickly with steroids and nebulizers.  She was incidentally found to have a hemoglobin of 5.9.  Her baseline appears to be between 10 and 12.  It was 10 in January of this year.  She endorses heavy menses and is supposed to be on iron 3 times daily but has not been taking it for a month or 2.  She was found to be guaiac positive in the ED.  PMHx, Fam Hx, and/or Soc Hx : She was admitted October 18 and discharged on November 1 for status asthmaticus with ventilatory requirements.  It was also felt that she had a component of upper airway dysfunction during that admission.  She also has a history of cocaine use.  Vitals:   04/19/19 0709 04/19/19 0858  BP: 123/86   Pulse: 97 100  Resp: (!) 23 18  Temp: 98.5 F (36.9 C)   SpO2: 96% 93%  General no acute distress, speaking in full sentences, no overt wheezing noted without stethoscope  HRRR no MRG LCTAB with good airflow, no wheezing  Psych her mood is labile and she became tearful during our visit.  It was hard to redirect her.  Hemoglobin 5.9 - 2 units PRBC - 7.9 - 7.3 MCV 74 RDW 20 Platelets 252 Ferritin 6 Percent  sat 4  I personally viewed the CXR images and confirmed my reading with the official read.  1 view AP portable, slight rotation, slight overpenetration, no infiltrates  I personally viewed the EKG and confirmed my reading with the official read.  Sinus tach, normal axis, normal intervals, no ischemic changes  Assessment and Plan: I have seen and evaluated the patient as outlined above. I agree with the formulated Assessment and Plan as detailed in the residents' note, with the following changes:  Deborah Melendez is a 44 year old woman with a self-reported history of asthma/COPD.  She presented with dyspnea and cough likely due to an asthma exacerbation possibly precipitated by a viral infection.  She responded rather quickly to prednisone and nebulizers in the ED.  She continues to have significant anxiety regarding her pulmonary status and also complains of chronic paroxysmal coughing spells which distracts her significantly.  She has never had PFTs and has a history of tobacco use.  PFTs will help to distinguish between asthma and COPD.  It may also suggest tracheomalacia which is a possible etiology following her ICU stay and subsequent paroxysmal coughing spells.  Definitive diagnosis of tracheomalacia would be a dynamic CT scan and bronchoscopy, both of which will need to be done as an outpatient.  During her ICU stay in October, upper airway dysfunction was proposed to explain  some of her wheezing.  Upper airway dysfunction work-up would start with PFTs.  She was incidentally found to have a hemoglobin of 5.9 and has since received 2 units of PRBC and 1 IV iron infusion.  Her microcytic anemia is iron deficiency although she was found to be guaiac positive in the ED and she will need to have outpatient work-up for this.  1.  Asthma exacerbation - resolved. PFT's to assess airway responsiveness.  Her only home asthma medicine is albuterol and I would not support expanding her medication regimen until he  has more information, ie her PFTs and possibly further work-up for tracheomalacia and upper airway dysfunction.  Upper airway dysfunction syndrome will also need to be considered as a possible contributing etiology for her wheezing.  PFTs will also start this work-up.  2.  Chronic paroxysmal coughing spells - start with PFTs for tracheomalacia although likely will need to proceed to dynamic CT and bronchoscopy both of which can be done as an outpatient.  3.  Iron deficiency anemia - s/p 2 units PRBC and IV iron.  She will need outpatient GI follow-up for the guaiac positivity.  4.  Mental health disorder - F/U with her outpatient team.  She would definitely benefit from cognitive behavioral therapy.  She would also benefit from breathing technique instructions including pursed lip breathing and focused breathing.  Bartholomew Crews, MD 4/22/20209:56 AM

## 2019-04-19 NOTE — Progress Notes (Signed)
Called to the room per RN that patient was in distress, on arrival noted tripodding and moderate respiratory distress. SATs are stable on RA.  BBS to auscultation reveal coarse expiratory wheezing, requiring a PRN Albuterol neb in which patient states helped aid her breathing. Patient states that she starts to panic when she cant breath some of it has to do with her past experience she states from previously being intubated and her COPD. Positive reinforcement was given to patient per RRT. Patient appears much more comfortable post neb treatment than initial assessment.

## 2019-04-19 NOTE — Progress Notes (Signed)
Patient has experience several episodes of sob of breath, crying out that she can breath, help me..help me!  I have provided nebulizer treatments throughout the night.  Saturation levels at the time of episodes are in the 90s, cough is stronger and is able to get phlegm out.  Patient states that she is afraid to go home this way.  I will keep monitoring patient.

## 2019-04-20 DIAGNOSIS — F319 Bipolar disorder, unspecified: Secondary | ICD-10-CM

## 2019-04-20 LAB — CBC
HCT: 23.5 % — ABNORMAL LOW (ref 36.0–46.0)
Hemoglobin: 6.9 g/dL — CL (ref 12.0–15.0)
MCH: 22.2 pg — ABNORMAL LOW (ref 26.0–34.0)
MCHC: 29.4 g/dL — ABNORMAL LOW (ref 30.0–36.0)
MCV: 75.6 fL — ABNORMAL LOW (ref 80.0–100.0)
Platelets: 168 10*3/uL (ref 150–400)
RBC: 3.11 MIL/uL — ABNORMAL LOW (ref 3.87–5.11)
RDW: 23.1 % — ABNORMAL HIGH (ref 11.5–15.5)
WBC: 9.6 10*3/uL (ref 4.0–10.5)
nRBC: 0.6 % — ABNORMAL HIGH (ref 0.0–0.2)

## 2019-04-20 LAB — BASIC METABOLIC PANEL
Anion gap: 12 (ref 5–15)
BUN: 9 mg/dL (ref 6–20)
CO2: 19 mmol/L — ABNORMAL LOW (ref 22–32)
Calcium: 8.7 mg/dL — ABNORMAL LOW (ref 8.9–10.3)
Chloride: 110 mmol/L (ref 98–111)
Creatinine, Ser: 0.89 mg/dL (ref 0.44–1.00)
GFR calc Af Amer: >60 mL/min (ref >60)
GFR calc non Af Amer: >60 mL/min (ref >60)
Glucose, Bld: 99 mg/dL (ref 70–99)
Potassium: 3.4 mmol/L — ABNORMAL LOW (ref 3.5–5.1)
Sodium: 141 mmol/L (ref 135–145)

## 2019-04-20 LAB — HEMOGLOBIN AND HEMATOCRIT, BLOOD
HCT: 28.6 % — ABNORMAL LOW (ref 36.0–46.0)
Hemoglobin: 8.5 g/dL — ABNORMAL LOW (ref 12.0–15.0)

## 2019-04-20 MED ORDER — GUAIFENESIN-DM 100-10 MG/5ML PO SYRP
10.0000 mL | ORAL_SOLUTION | ORAL | Status: DC
  Administered 2019-04-20 – 2019-04-21 (×5): 10 mL via ORAL
  Filled 2019-04-20 (×6): qty 10

## 2019-04-20 MED ORDER — GUAIFENESIN-DM 100-10 MG/5ML PO SYRP: 5.0000 mL | ORAL_SOLUTION | ORAL | Status: DC

## 2019-04-20 MED ORDER — BENZONATATE 100 MG PO CAPS
100.0000 mg | ORAL_CAPSULE | Freq: Three times a day (TID) | ORAL | Status: DC
  Administered 2019-04-20 – 2019-04-21 (×4): 100 mg via ORAL
  Filled 2019-04-20 (×4): qty 1

## 2019-04-20 MED ORDER — BENZONATATE 100 MG PO CAPS: 100.0000 mg | ORAL_CAPSULE | Freq: Three times a day (TID) | ORAL | Status: DC

## 2019-04-20 MED ORDER — GUAIFENESIN-DM 100-10 MG/5ML PO SYRP
5.0000 mL | ORAL_SOLUTION | ORAL | Status: DC
  Administered 2019-04-20: 5 mL via ORAL

## 2019-04-20 NOTE — TOC Initial Note (Signed)
Transition of Care [facility]) - Initial/Assessment Note    Patient Details  Name: Deborah Melendez MRN: [id number] Date of Birth: 1976-05-24  [facility]) CM/SW Contact:    Bethena Roys, RN Phone Number: 04/20/2019, 10:17 AM  Clinical Narrative:  Pt Presented for symptomatic anemia. PTA from home- has DME Nebulizer and states it is broken. Initial conversation, patient did not want to cooperate with CM. CM did ask if she had called the agency to see if they could send a technician out to repair. Patient states no one in the home at this time. However, CM did call [facility] to see if they can do charity care. Charity was approved via [facility] and patient has nebulizer in the room. Patient has to fill out paperwork and if she does not she will pay $100.00. [facility] states patient is aware. Pt has PCP Dr. Margarita Rana- [facility] [place]. Financial Counselor is following the patient as well.    Expected Discharge Plan: Home/Self Care Barriers to Discharge: No Barriers Identified   Patient Goals and CMS Choice   CMS Medicare.gov Compare Post Acute Care list provided to:: (N/A) Choice offered to / list presented to : NA  Expected Discharge Plan and Services Expected Discharge Plan: Home/Self Care In-house Referral: Financial Counselor Discharge Planning Services: CM Consult Post Acute Care Choice: Durable Medical Equipment Living arrangements for the past 2 months: Apartment                 DME Arranged: Nebulizer machine([employer] for Nebulizer via [employer]) DME Agency: [employer] Date DME Agency Contacted: 04/19/19 Time DME Agency Contacted: 59 Representative spoke with at DME Agency: [place] with [employer] HH Arranged: NA [place] Agency: NA        Prior Living Arrangements/Services Living arrangements for the past 2 months: Apartment Lives with:: Minor Children Patient language and need for interpreter reviewed:: Yes Do you feel safe going back to the place where you  live?: Yes      Need for Family Participation in Patient Care: No (Comment) Care giver support system in place?: No (comment) Current home services: DME(Has nebulizer says it was broken) Criminal Activity/Legal Involvement Pertinent to Current Situation/Hospitalization: No - Comment as needed  Activities of Daily Living Home Assistive Devices/Equipment: Nebulizer ADL Screening (condition at time of admission) Patient's cognitive ability adequate to safely complete daily activities?: Yes Is the patient deaf or have difficulty hearing?: No Does the patient have difficulty seeing, even when wearing glasses/contacts?: No Does the patient have difficulty concentrating, remembering, or making decisions?: No Patient able to express need for assistance with ADLs?: Yes Does the patient have difficulty dressing or bathing?: No Independently performs ADLs?: Yes (appropriate for developmental age) Does the patient have difficulty walking or climbing stairs?: No Weakness of Legs: None Weakness of Arms/Hands: None  Permission Sought/Granted Permission sought to share information with : Case Manager                Emotional Assessment Appearance:: Appears stated age Attitude/Demeanor/Rapport: Angry(Initial Conversation) Affect (typically observed): Agitated Orientation: : Oriented to Self, Oriented to Situation, Oriented to Place, Oriented to  Time Alcohol / Substance Use: Not Applicable Psych Involvement: No (comment)  Admission diagnosis:  Guaiac + stool [R19.5] COPD exacerbation ([place]) [J44.1] Symptomatic anemia [D64.9] Patient Active Problem List   Diagnosis Date Noted  . Symptomatic anemia 04/18/2019  . SOB (shortness of breath) 01/18/2019  . Acute asthma exacerbation 01/18/2019  . MDD (major depressive disorder), severe ([place]) 12/21/2018  .  Asthma, chronic 12/21/2018  . Bipolar I disorder, most recent episode depressed ([place]) 12/21/2018  . Altered mental status   . Acute  respiratory failure with hypoxia ([place]) 10/14/2018  . Bronchitis 10/05/2018  . Acute bronchitis with asthma with acute exacerbation 10/04/2018  . Major depressive disorder, recurrent episode with mixed features ([place]) 01/27/2018  . Tobacco use disorder 05/22/2017  . Suicidal ideation   . Cannabis use disorder, severe, dependence ([place]) 01/20/2017  . Cocaine use disorder, severe, dependence ([place]) 08/08/2015   PCP:  Charlott Rakes, MD Pharmacy:   [facility], [place] [address] Phone: [phone] Fax: [phone]  [id number] - [place], [place] [address] [place] [address] Phone: [phone] Fax: [place], [place] - [address] [facility]. [address] [place] [address] Phone: [phone] Fax: [place], [place] - [address] [place] [address] Phone: [phone] Fax: [phone]     Social Determinants of Health (SDOH) Interventions    Readmission Risk Interventions No flowsheet data found.

## 2019-04-20 NOTE — Progress Notes (Signed)
   Subjective:   Deborah Melendez reports that she still feels congested in her chest and nose but endorses improvement with her shortness of breath. She is coughing stronger today with "greeninsh" sputum production. She was able to sleep well. We discussed the possibilities of why she could be experiencing chronic cough and the benefits of diagnostic or screening imaging. She stated that her daughter recently had a cold and that's what set off this acute episode but her cough has been present since october. She is unsure if she experiences post-nasal drip. She has not followed up with a pulmonologist.    Objective:  Vital signs in last 24 hours: Vitals:   04/19/19 2005 04/19/19 2131 04/20/19 0254 04/20/19 0635  BP:  (!) 144/97  (!) 132/104  Pulse:  93  (!) 122  Resp:      Temp:  97.9 F (36.6 C)  98.2 F (36.8 C)  TempSrc:  Oral  Oral  SpO2: 100% 98% 100% 100%  Weight:      Height:       Const: NAD, good mood  Resp: intermittent coughing fits, initially with diffuse wheezing and rhonchus breath sounds but with pursed lip breathing, these resolve and her lungs are CTAB  Assessment/Plan:  Principal Problem:   Symptomatic anemia Active Problems:   Cocaine use disorder, severe, dependence (HCC)   Acute asthma exacerbation  Deborah Melendez is a 43 y.o female with asthma/COPD and polysubstance abuse (crack cocaine), who presented to the emergency department with two days of progressive shortness of breath and cough, found to have a hemoglobin of 5.9 down from a baseline of approximately 10. She was subsequently admitted for further evaluation and management.  Symptomatic Anemia  Iron Deficiency Anemia  - on admission, hemoglobin 5.9, iron 19, ferritin 6, calculated iron deficit of 1179mg  - s/p 1U PRBCs and 510mg  IV iron 4/21 which should total to roughly 750mg  iron - hemoglobin 6.9 this morning, menstrual bleeding has resolved - transfuse another 1U PRBCs - f/u post transfusion H and  H - recheck cbc tomorrow   Asthma Exacerbation, chronic cough - intermittent productive coughing spells with normal oxygenation on room air. Suspect most of her symptoms are 2/2 upper airway disease given lung ascultation improves with pursed lip breathing - Would like to initiate work up for upper airway disease and explanations for her chronic cough. Unfortunately, unable to get PFTs given coronavirus restrictions. Will try to arrange outpatient pulmonology appointment prior to discharge    - Albuterol and ipratropium prn for wheezing and SHOB  - scheduled robitussin and tessalon perles  - Prednisone taper  - consult care management for nebulizer machine   Bipolar 1 disorder: well controlled  - continue home prozac, seroquel, and prn trazodone   Substance Use Disorder  - Crack cocaine, last used 4/19  - No withdrawal symptoms. Continue monitoring.    Dispo: Anticipated discharge in approximately 1 day(s).   Isabelle Course, MD 04/20/2019, 6:49 AM Pager: [phone]

## 2019-04-20 NOTE — Progress Notes (Signed)
  Date: 04/20/2019  Patient name: Deborah Melendez  Medical record number: [id number]  Date of birth: November 04, 1976   I have seen and evaluated this patient and I have discussed the plan of care with the house staff. Please see their note for complete details. I concur with their findings with the following additions/corrections: [name] was seen this morning on team rounds.  I agree with Dr. Donne Hazel that her symptoms are not just due to asthma but likely also a component of upper airway dysfunction and/or tracheomalacia.  We would like to started on work-up with PFTs but they are not currently available due to the pandemic.  We will start with an outpatient pulmonology appointment for office spirometry and consideration of a dynamic CT scan and bronchoscopy to start her work-up tracheomalacia.  She might also need an ENT evaluation to assess for vocal cord dysfunction.  Bartholomew Crews, MD 04/20/2019, 11:13 AM

## 2019-04-21 DIAGNOSIS — N920 Excessive and frequent menstruation with regular cycle: Secondary | ICD-10-CM

## 2019-04-21 LAB — BPAM RBC
Blood Product Expiration Date: 202005012359
Blood Product Expiration Date: 202005012359
ISSUE DATE / TIME: 202004211203
ISSUE DATE / TIME: 202004231601
Unit Type and Rh: 5100
Unit Type and Rh: 5100

## 2019-04-21 LAB — TYPE AND SCREEN
ABO/RH(D): O POS
Antibody Screen: NEGATIVE
Unit division: 0
Unit division: 0

## 2019-04-21 LAB — CBC
HCT: 26.6 % — ABNORMAL LOW (ref 36.0–46.0)
Hemoglobin: 8.1 g/dL — ABNORMAL LOW (ref 12.0–15.0)
MCH: 23.3 pg — ABNORMAL LOW (ref 26.0–34.0)
MCHC: 30.5 g/dL (ref 30.0–36.0)
MCV: 76.4 fL — ABNORMAL LOW (ref 80.0–100.0)
Platelets: 181 10*3/uL (ref 150–400)
RBC: 3.48 MIL/uL — ABNORMAL LOW (ref 3.87–5.11)
RDW: 23.2 % — ABNORMAL HIGH (ref 11.5–15.5)
WBC: 13.6 10*3/uL — ABNORMAL HIGH (ref 4.0–10.5)
nRBC: 2 % — ABNORMAL HIGH (ref 0.0–0.2)

## 2019-04-21 MED ORDER — GUAIFENESIN-DM 100-10 MG/5ML PO SYRP: 10.0000 mL | ORAL_SOLUTION | ORAL | 0 refills | Status: DC

## 2019-04-21 MED ORDER — FLUOXETINE HCL 40 MG PO CAPS: 40.0000 mg | ORAL_CAPSULE | Freq: Every day | ORAL | 0 refills | Status: DC

## 2019-04-21 MED ORDER — BENZONATATE 100 MG PO CAPS: 100.0000 mg | ORAL_CAPSULE | Freq: Three times a day (TID) | ORAL | 0 refills | Status: DC

## 2019-04-21 MED ORDER — TRAZODONE HCL 100 MG PO TABS: 100.0000 mg | ORAL_TABLET | Freq: Every evening | ORAL | 0 refills | Status: DC | PRN

## 2019-04-21 MED ORDER — QUETIAPINE FUMARATE 200 MG PO TABS: 200.0000 mg | ORAL_TABLET | Freq: Every day | ORAL | 0 refills | Status: DC

## 2019-04-21 MED ORDER — ALBUTEROL SULFATE HFA 108 (90 BASE) MCG/ACT IN AERS: 2.0000 | INHALATION_SPRAY | Freq: Four times a day (QID) | RESPIRATORY_TRACT | 0 refills | Status: DC | PRN

## 2019-04-21 MED ORDER — FERROUS SULFATE 325 (65 FE) MG PO TABS: 325.0000 mg | ORAL_TABLET | Freq: Every day | ORAL | 3 refills | Status: DC

## 2019-04-21 MED ORDER — IPRATROPIUM-ALBUTEROL 0.5-2.5 (3) MG/3ML IN SOLN: 3.0000 mL | RESPIRATORY_TRACT | 0 refills | Status: DC | PRN

## 2019-04-21 MED ORDER — PREDNISONE 5 MG PO TABS: ORAL_TABLET | ORAL | 0 refills | Status: DC

## 2019-04-21 MED FILL — BENZONATATE 100 MG CAP: 100 | 7 days supply | Qty: 20 | Fill #0

## 2019-04-21 MED FILL — VENTOLIN HFA 90 MCG INHALER: 108 (90 BAS | 25 days supply | Qty: 18 | Fill #0

## 2019-04-21 MED FILL — predniSONE 5 MG TABS: 5 | 12 days supply | Qty: 49 | Fill #0

## 2019-04-21 MED FILL — SM TUSSIN DM SYRUP: 100-10 | 4 days supply | Qty: 236 | Fill #0

## 2019-04-21 MED FILL — QUETIAPINE FUMARATE 100 MG: 100 | 30 days supply | Qty: 60 | Fill #0

## 2019-04-21 MED FILL — FLUoxetine HCL 40 MG CAPS: 40 | 30 days supply | Qty: 30 | Fill #0

## 2019-04-21 MED FILL — IPRAT-ALBUT 0.5-3(2.5) MG/3: 0.5-2.5 (3) | 10 days supply | Qty: 180 | Fill #0

## 2019-04-21 MED FILL — traZODone HCL 100 MG TABS: 100 | 30 days supply | Qty: 30 | Fill #0

## 2019-04-21 NOTE — Progress Notes (Signed)
   Subjective:   Ms. Dierolf reports that her cough and hoarseness is worse this morning. She feels fatigued this morning and think her "cold" is getting worse but does have improvement with the medications she has been receiving at the hospital. She usually receive her medications "at the pharmacist" but reports that her asthma medications are given to her by the hospital. During her last admission, she was given medication assistance. Denies dizziness or lightheadedness with ambulation   Objective:  Vital signs in last 24 hours: Vitals:   04/20/19 2030 04/20/19 2210 04/21/19 0219 04/21/19 0423  BP:  (!) 142/95  (!) 135/96  Pulse:  89  95  Resp:  20  16  Temp:  98.6 F (37 C)  98.8 F (37.1 C)  TempSrc:  Oral  Oral  SpO2: 98% 100% 98% 95%  Weight:      Height:       Const: NAD, good mood  Resp: no coughing, CTAB, on RA  Assessment/Plan:  Principal Problem:   Symptomatic anemia Active Problems:   Cocaine use disorder, severe, dependence (HCC)   Acute asthma exacerbation  Nevaeh Matsushita is a 43 y.o female with asthma/COPD and polysubstance abuse (crack cocaine), who presented to the emergency department with two days of progressive shortness of breath and cough, found to have a hemoglobin of 5.9 down from a baseline of approximately 10. She was subsequently admitted for further evaluation and management.  Symptomatic Anemia  Iron Deficiency Anemia  - on admission, hemoglobin 5.9, iron 19, ferritin 6, calculated iron deficit of 1179mg  - s/p 2U PRBCs and 510mg  IV iron 4/21 which should total to roughly 1,000mg  iron - hemoglobin 8.1 this morning, stable - d/c home on daily po iron supplement  Asthma Exacerbation, chronic cough - intermittent productive coughing spells with normal oxygenation on room air. Suspect most of her symptoms are 2/2 upper airway disease given lung ascultation improves with pursed lip breathing - Would like to initiate work up for upper airway disease  and explanations for her chronic cough. Unfortunately, unable to get PFTs given coronavirus restrictions and getting outpt pulm appt is difficult due to her lack of insurance. Encouraged her to f/u with community health and wellness to pursue financial assistance. She was previously in the process of applying for medicaid but doesn't know what happened.    - Albuterol and ipratropium prn for wheezing and SHOB  - scheduled robitussin and tessalon perles  - Prednisone taper  - consult care management for medication assistance   Bipolar 1 disorder: well controlled  - continue home prozac, seroquel, and prn trazodone   Substance Use Disorder  - Crack cocaine, last used 4/19  - No withdrawal symptoms. Continue monitoring.    Dispo: Anticipated discharge in approximately today  Isabelle Course, MD 04/21/2019, 6:56 AM Pager: [phone]

## 2019-04-21 NOTE — TOC Transition Note (Addendum)
Transition of Care [facility]) - CM/SW Discharge Note   Patient Details  Name: Deborah Melendez MRN: [id number] Date of Birth: 1976/05/23  Transition of Care [facility]) CM/SW Contact:  Bethena Roys, RN Phone Number: 04/21/2019, 11:15 AM   Clinical Narrative:  Hospital follow up scheduled and placed on the AVS- [facility] program provided and patient co pay overridden. Medications will delivered to bedside from TOC-Pharmacy. Patient will then thereafter get medication assistance at the [facility] once she see's her provider at the clinic in the future. Patient is leaving with all meds to assist her in the community. Substance abuse resources provided. No further needs from CM at this time.     Final next level of care: Home/Self Care Barriers to Discharge: No Barriers Identified   Patient Goals and CMS Choice   CMS Medicare.gov Compare Post Acute Care list provided to:: (N/A) Choice offered to / list presented to : NA  Discharge Placement     Discharge Plan and Services In-house Referral: Financial Counselor Discharge Planning Services: Follow-up appt scheduled, [facility] Post Acute Care Choice: Durable Medical Equipment          DME Arranged: Nebulizer machine([employer] for Nebulizer via [employer]) DME Agency: [employer] Date DME Agency Contacted: 04/19/19 Time DME Agency Contacted: 33 Representative spoke with at [place]: [place]ack with [employer] HH Arranged: NA [place] Agency: NA        Social Determinants of Health ([place]) Interventions     Readmission Risk Interventions No flowsheet data found.

## 2019-04-21 NOTE — Discharge Instructions (Signed)
Iron Deficiency Anemia, Adult Iron-deficiency anemia is when you have a low amount of red blood cells or hemoglobin. This happens because you have too little iron in your body. Hemoglobin carries oxygen to parts of the body. Anemia can cause your body to not get enough oxygen. It may or may not cause symptoms. Follow these instructions at home: Medicines  Take over-the-counter and prescription medicines only as told by your doctor. This includes iron pills (supplements) and vitamins.  If you cannot handle taking iron pills by mouth, ask your doctor about getting iron through: ? A vein (intravenously). ? A shot (injection) into a muscle.  Take iron pills when your stomach is empty. If you cannot handle this, take them with food.  Do not drink milk or take antacids at the same time as your iron pills.  To prevent trouble pooping (constipation), eat fiber or take medicine (stool softener) as told by your doctor. Eating and drinking   Talk with your doctor before changing the foods you eat. He or she may tell you to eat foods that have a lot of iron, such as: ? Liver. ? Lowfat (lean) beef. ? Breads and cereals that have iron added to them (fortified breads and cereals). ? Eggs. ? Dried fruit. ? Dark green, leafy vegetables.  Drink enough fluid to keep your pee (urine) clear or pale yellow.  Eat fresh fruits and vegetables that are high in vitamin C. They help your body to use iron. Foods with a lot of vitamin C include: ? Oranges. ? Peppers. ? Tomatoes. ? Mangoes. General instructions  Return to your normal activities as told by your doctor. Ask your doctor what activities are safe for you.  Keep yourself clean, and keep things clean around you (your surroundings). Anemia can make you get sick more easily.  Keep all follow-up visits as told by your doctor. This is important. Contact a doctor if:  You feel sick to your stomach (nauseous).  You throw up (vomit).  You feel  weak.  You are sweating for no clear reason.  You have trouble pooping, such as: ? Pooping (having a bowel movement) less than 3 times a week. ? Straining to poop. ? Having poop that is hard, dry, or larger than normal. ? Feeling full or bloated. ? Pain in the lower belly. ? Not feeling better after pooping. Get help right away if:  You pass out (faint). If this happens, do not drive yourself to the hospital. Call your local emergency services (911 in the [place]  You have chest pain.  You have shortness of breath that: ? Is very bad. ? Gets worse with physical activity.  You have a fast heartbeat.  You get light-headed when getting up from sitting or lying down. This information is not intended to replace advice given to you by your health care provider. Make sure you discuss any questions you have with your health care provider. Document Released: 01/16/2011 Document Revised: 09/02/2016 Document Reviewed: 09/02/2016 Elsevier Interactive Patient Education  2019 [employer].   Asthma, Adult  Asthma is a long-term (chronic) condition in which the airways get tight and narrow. The airways are the breathing passages that lead from the nose and mouth down into the lungs. A person with asthma will have times when symptoms get worse. These are called asthma attacks. They can cause coughing, whistling sounds when you breathe (wheezing), shortness of breath, and chest pain. They can make it hard to breathe. There is no cure  for asthma, but medicines and lifestyle changes can help control it. There are many things that can bring on an asthma attack or make asthma symptoms worse (triggers). Common triggers include:  Mold.  Dust.  Cigarette smoke.  Cockroaches.  Things that can cause allergy symptoms (allergens). These include animal skin flakes (dander) and pollen from trees or grass.  Things that pollute the air. These may include household cleaners, wood smoke, smog, or chemical  odors.  Cold air, weather changes, and wind.  Crying or laughing hard.  Stress.  Certain medicines or drugs.  Certain foods such as dried fruit, potato chips, and grape juice.  Infections, such as a cold or the flu.  Certain medical conditions or diseases.  Exercise or tiring activities. Asthma may be treated with medicines and by staying away from the things that cause asthma attacks. Types of medicines may include:  Controller medicines. These help prevent asthma symptoms. They are usually taken every day.  Fast-acting reliever or rescue medicines. These quickly relieve asthma symptoms. They are used as needed and provide short-term relief.  Allergy medicines if your attacks are brought on by allergens.  Medicines to help control the body's defense (immune) system. Follow these instructions at home: Avoiding triggers in your home  Change your heating and air conditioning filter often.  Limit your use of fireplaces and wood stoves.  Get rid of pests (such as roaches and mice) and their droppings.  Throw away plants if you see mold on them.  Clean your floors. Dust regularly. Use cleaning products that do not smell.  Have someone vacuum when you are not home. Use a vacuum cleaner with a HEPA filter if possible.  Replace carpet with wood, tile, or vinyl flooring. Carpet can trap animal skin flakes and dust.  Use allergy-proof pillows, mattress covers, and box spring covers.  Wash bed sheets and blankets every week in hot water. Dry them in a dryer.  Keep your bedroom free of any triggers.  Avoid pets and keep windows closed when things that cause allergy symptoms are in the air.  Use blankets that are made of polyester or cotton.  Clean bathrooms and kitchens with bleach. If possible, have someone repaint the walls in these rooms with mold-resistant paint. Keep out of the rooms that are being cleaned and painted.  Wash your hands often with soap and water. If  soap and water are not available, use hand sanitizer.  Do not allow anyone to smoke in your home. General instructions  Take over-the-counter and prescription medicines only as told by your doctor. ? Talk with your doctor if you have questions about how or when to take your medicines. ? Make note if you need to use your medicines more often than usual.  Do not use any products that contain nicotine or tobacco, such as cigarettes and e-cigarettes. If you need help quitting, ask your doctor.  Stay away from secondhand smoke.  Avoid doing things outdoors when allergen counts are high and when air quality is low.  Wear a ski mask when doing outdoor activities in the winter. The mask should cover your nose and mouth. Exercise indoors on cold days if you can.  Warm up before you exercise. Take time to cool down after exercise.  Use a peak flow meter as told by your doctor. A peak flow meter is a tool that measures how well the lungs are working.  Keep track of the peak flow meter's readings. Write them down.  Follow  your asthma action plan. This is a written plan for taking care of your asthma and treating your attacks.  Make sure you get all the shots (vaccines) that your doctor recommends. Ask your doctor about a flu shot and a pneumonia shot.  Keep all follow-up visits as told by your doctor. This is important. Contact a doctor if:  You have wheezing, shortness of breath, or a cough even while taking medicine to prevent attacks.  The mucus you cough up (sputum) is thicker than usual.  The mucus you cough up changes from clear or white to yellow, green, gray, or bloody.  You have problems from the medicine you are taking, such as: ? A rash. ? Itching. ? Swelling. ? Trouble breathing.  You need reliever medicines more than 2-3 times a week.  Your peak flow reading is still at 50-79% of your personal best after following the action plan for 1 hour.  You have a fever. Get help  right away if:  You seem to be worse and are not responding to medicine during an asthma attack.  You are short of breath even at rest.  You get short of breath when doing very little activity.  You have trouble eating, drinking, or talking.  You have chest pain or tightness.  You have a fast heartbeat.  Your lips or fingernails start to turn blue.  You are light-headed or dizzy, or you faint.  Your peak flow is less than 50% of your personal best.  You feel too tired to breathe normally. Summary  Asthma is a long-term (chronic) condition in which the airways get tight and narrow. An asthma attack can make it hard to breathe.  Asthma cannot be cured, but medicines and lifestyle changes can help control it.  Make sure you understand how to avoid triggers and how and when to use your medicines. This information is not intended to replace advice given to you by your health care provider. Make sure you discuss any questions you have with your health care provider. Document Released: 06/01/2008 Document Revised: 01/18/2017 Document Reviewed: 01/18/2017 Elsevier Interactive Patient Education  2019 [employer].

## 2019-04-21 NOTE — Discharge Summary (Signed)
Name: Deborah Melendez MRN: [id number] DOB: September 03, 1976 43 y.o. PCP: Charlott Rakes, MD  Date of Admission: 04/18/2019  4:39 AM Date of Discharge: 04/21/2019 Attending Physician: No att. providers found  Discharge Diagnosis: 1. Iron def anemia 2/2 menorrhagia  2. Asthma exacerbation 3. Suspicion for tracheomalacia  4. Bipolar 1 Disorder   Discharge Medications: Allergies as of 04/21/2019   No Known Allergies     Medication List    TAKE these medications   acetaminophen 500 MG tablet Commonly known as:  TYLENOL Take 1,000 mg by mouth every 6 (six) hours as needed for mild pain or headache.   albuterol 108 (90 Base) MCG/ACT inhaler Commonly known as:  VENTOLIN HFA Inhale 2 puffs into the lungs every 6 (six) hours as needed for wheezing or shortness of breath.   benzonatate 100 MG capsule Commonly known as:  TESSALON Take 1 capsule (100 mg total) by mouth 3 (three) times daily. What changed:    medication strength  how much to take  when to take this  reasons to take this   ferrous sulfate 325 (65 FE) MG tablet Take 1 tablet (325 mg total) by mouth daily with breakfast. What changed:  when to take this   FLUoxetine 40 MG capsule Commonly known as:  PROZAC Take 1 capsule (40 mg total) by mouth daily.   guaiFENesin-dextromethorphan 100-10 MG/5ML syrup Commonly known as:  ROBITUSSIN DM Take 10 mLs by mouth every 4 (four) hours while awake.   ipratropium-albuterol 0.5-2.5 (3) MG/3ML Soln Commonly known as:  DUONEB Take 3 mLs by nebulization every 4 (four) hours as needed. What changed:    how much to take  how to take this  when to take this  reasons to take this  additional instructions   loratadine 10 MG tablet Commonly known as:  CLARITIN Take 1 tablet (10 mg total) by mouth daily.   predniSONE 5 MG tablet Commonly known as:  DELTASONE Label  & dispense according to the schedule below. take 8 Pills PO for 1 days, 6 Pills PO for 3 days, 4  Pills PO for 3 days, 2 Pills PO for 3 days, 1 Pills PO for 3 days, 1/2 Pill  PO for 3 days then STOP. Total 49 pills. What changed:  additional instructions   QUEtiapine 200 MG tablet Commonly known as:  SEROQUEL Take 1 tablet (200 mg total) by mouth at bedtime.   traZODone 100 MG tablet Commonly known as:  DESYREL Take 1 tablet (100 mg total) by mouth at bedtime as needed for sleep.       Disposition and follow-up:   [name] was discharged from [facility] in Stable condition.  At the hospital follow up visit please address:  1. Iron def anemia 2/2 menorrhagia:  - repeat CBC -  continue once daily po iron -  discuss contraception treat menorrhagia.   2. Asthma exacerbation: -  patient would benefit from PFTs to help diagnose asthma vs copd - ensure completion of prednisone taper  - discharged with new neb machine, duoneb solution, and albuterol inhaler  3. Suspicion for tracheomalacia  - chronic cough and voice hoarseness after prolonged intubation in Oct 2019 - strong suspicion for tracheomalacia - needs further work up with PFTs, bronchoscopy, and dynamic CT  4. Bipolar 1 Disorder  - stable on home meds - discharged with seroquel, trazodone, fluoxetine  - needs behavioral health f/u to ensure medication refills   2.  Labs / imaging  needed at time of follow-up: CBC  3.  Pending labs/ test needing follow-up: none   Follow-up Appointments: Follow-up [place] Follow up on 05/02/2019.   Why:  @ 9:30 am for hospital follow up appointment with Dr. Margarita Rana- If you can not make this scheduled appointment please call and reschedule.  Contact information: [place] [id number] [place] [facility] Course by problem list: Deborah Melendez is a 17 y.ofemale with asthma/COPD and polysubstance abuse(crack cocaine), who presented to the emergency  department with two days of progressive shortness of breath and cough, found to have a hemoglobin of 5.9 down from a baseline of approximately 10. She was subsequently admitted for further evaluation and management.   Her anemia was attributed to menorrhagia.  She was on her period during admission.  She was previously on p.o. iron but had stopped taking it several months ago.  She received a total of 2 units of blood and 1 dose of IV iron.  Hemoglobin at discharge was 8.1. She was instructed to take po iron once daily and f/u with pcp for repeat cbc and discussion of menorrhagia treatment.   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  Unfortunately, her lack of insurance will make it difficult to get adequate work-up and possible treatment for this condition.  We tried to get PFTs during hospitalization but given coronavirus restrictions, they are not performing routine PFTs.  She was treated with a prednisone taper and breathing treatments and cough medicine.  Throughout the entire hospitalization he was oxygenating well on room air.  At discharge, her lungs were clear to auscultation, normal work of breathing, no cough.  She was endorsing fatigue and continued chest congestion. She was discharged with prednisone taper, nebulizer machine with DuoNeb solution, albuterol inhaler and hospital follow-up appointment with PCP was scheduled.  She was encouraged to work on getting financial assistance and continue following with her PCP to work-up her chronic cough and voice changes.  Discharge Vitals:   BP (!) 135/96 (BP Location: Left Arm)   Pulse 95   Temp 98.8 F (37.1 C) (Oral)   Resp 16   Ht 5\' 4"  (1.626 m)   Wt 69 kg   LMP 04/17/2019 (Exact Date)   SpO2 95%   BMI 26.13 kg/m   Pertinent Labs, Studies, and Procedures:  CBC Latest Ref Rng & Units 04/21/2019 04/20/2019 04/20/2019  WBC 4.0 - 10.5 K/uL 13.6(H) - 9.6   Hemoglobin 12.0 - 15.0 g/dL 8.1(L) 8.5(L) 6.9(LL)  Hematocrit 36.0 - 46.0 % 26.6(L) 28.6(L) 23.5(L)  Platelets 150 - 400 K/uL 181 - 168   BMP Latest Ref Rng & Units 04/20/2019 04/18/2019 01/19/2019  Glucose 70 - 99 mg/dL 99 112(H) 184(H)  BUN 6 - 20 mg/dL 9 10 9   Creatinine 0.44 - 1.00 mg/dL 0.89 0.88 0.83  BUN/Creat Ratio 9 - 23 - - -  Sodium 135 - 145 mmol/L 141 139 138  Potassium 3.5 - 5.1 mmol/L 3.4(L) 3.6 3.5  Chloride 98 - 111 mmol/L 110 110 111  CO2 22 - 32 mmol/L 19(L) 19(L) 18(L)  Calcium 8.9 - 10.3 mg/dL 8.7(L) 8.8(L) 8.8(L)     Discharge Instructions: Discharge Instructions    Diet - low sodium heart healthy   Complete by:  As directed    Discharge instructions   Complete  by:  As directed    Ms. Schmoker,   You were admitted to the hospital because your blood count was very low. We gave you 2 blood transfusions and some IV iron. We also treated you for an asthma exacerbation. It will be important for you to follow up with your regular doctor and continue working on getting medicaid or some other type of financial assistance.   For your anemia (low blood count) - take one iron pill every day with breakfast - we have scheduled you an appointment with your regular doctor on 5/5 - you will need repeat blood work at this appointment   For your asthma: - take prednisone as prescribed. You will decrease the number of pills every couple days.  - use your inhaler and nebulizer as needed for shortness of breath/wheezing - when you get short of breath and start coughing at home, try breathing with pursed lips (like you're whistling) - ask your doctor about any additional inhalers you can be on  For your chronic cough and voice hoarseness: - follow up with your regular doctor and ask if they can refer you to a lung specialist (Pulmonologist) - you need additional testing to figure out what's causing your cough and voice change and then figure out the best way to treat it - I have  given you cough medicine to use at home  Come back to the hospital if you experience severe shortness of breath, fevers, worsening productive cough.   Increase activity slowly   Complete by:  As directed       Signed: Isabelle Course, MD 04/23/2019, 11:48 AM   Pager: [phone]

## 2019-04-24 ENCOUNTER — Telehealth: Payer: Self-pay

## 2019-04-24 NOTE — Telephone Encounter (Signed)
Transition Care Management Follow-up Telephone Call Date of discharge and from where: 04/21/2019, [facility]  Call placed to patient # [phone] and a message was left requesting a call back to this CM # [phone]

## 2019-04-25 ENCOUNTER — Telehealth: Payer: Self-pay

## 2019-04-25 NOTE — Telephone Encounter (Signed)
Transition Care Management Follow-up Telephone Call  #2   Date of discharge and from where: 04/21/2019, [facility]  Call placed to patient # [phone] and a message was left requesting a call back to this CM # [phone]

## 2019-05-02 ENCOUNTER — Inpatient Hospital Stay: Payer: Self-pay | Admitting: Family Medicine

## 2019-06-04 ENCOUNTER — Encounter (HOSPITAL_COMMUNITY): Payer: Self-pay | Admitting: Emergency Medicine

## 2019-06-04 ENCOUNTER — Emergency Department (HOSPITAL_COMMUNITY): Payer: Self-pay

## 2019-06-04 ENCOUNTER — Other Ambulatory Visit: Payer: Self-pay

## 2019-06-04 ENCOUNTER — Emergency Department (HOSPITAL_COMMUNITY)
Admission: EM | Admit: 2019-06-04 | Discharge: 2019-06-04 | Disposition: A | Discharge status: Home / Self Care | Payer: Self-pay | Attending: Emergency Medicine | Admitting: Emergency Medicine

## 2019-06-04 DIAGNOSIS — F1490 Cocaine use, unspecified, uncomplicated: Secondary | ICD-10-CM | POA: Insufficient documentation

## 2019-06-04 DIAGNOSIS — Z79899 Other long term (current) drug therapy: Secondary | ICD-10-CM | POA: Insufficient documentation

## 2019-06-04 DIAGNOSIS — F17210 Nicotine dependence, cigarettes, uncomplicated: Secondary | ICD-10-CM | POA: Insufficient documentation

## 2019-06-04 DIAGNOSIS — J449 Chronic obstructive pulmonary disease, unspecified: Secondary | ICD-10-CM | POA: Insufficient documentation

## 2019-06-04 DIAGNOSIS — J45909 Unspecified asthma, uncomplicated: Secondary | ICD-10-CM | POA: Insufficient documentation

## 2019-06-04 LAB — CBC WITH DIFFERENTIAL/PLATELET
Abs Immature Granulocytes: 0.02 10*3/uL (ref 0.00–0.07)
Basophils Absolute: 0.1 10*3/uL (ref 0.0–0.1)
Basophils Relative: 1 %
Eosinophils Absolute: 0.3 10*3/uL (ref 0.0–0.5)
Eosinophils Relative: 4 %
HCT: 28.6 % — ABNORMAL LOW (ref 36.0–46.0)
Hemoglobin: 8 g/dL — ABNORMAL LOW (ref 12.0–15.0)
Immature Granulocytes: 0 %
Lymphocytes Relative: 26 %
Lymphs Abs: 1.8 10*3/uL (ref 0.7–4.0)
MCH: 22.9 pg — ABNORMAL LOW (ref 26.0–34.0)
MCHC: 28 g/dL — ABNORMAL LOW (ref 30.0–36.0)
MCV: 81.7 fL (ref 80.0–100.0)
Monocytes Absolute: 0.7 10*3/uL (ref 0.1–1.0)
Monocytes Relative: 10 %
Neutro Abs: 4 10*3/uL (ref 1.7–7.7)
Neutrophils Relative %: 59 %
Platelets: 371 10*3/uL (ref 150–400)
RBC: 3.5 MIL/uL — ABNORMAL LOW (ref 3.87–5.11)
RDW: 24.2 % — ABNORMAL HIGH (ref 11.5–15.5)
WBC: 6.8 10*3/uL (ref 4.0–10.5)
nRBC: 0 % (ref 0.0–0.2)

## 2019-06-04 LAB — COMPREHENSIVE METABOLIC PANEL
ALT: 12 U/L (ref 0–44)
AST: 24 U/L (ref 15–41)
Albumin: 4 g/dL (ref 3.5–5.0)
Alkaline Phosphatase: 55 U/L (ref 38–126)
Anion gap: 10 (ref 5–15)
BUN: 8 mg/dL (ref 6–20)
CO2: 22 mmol/L (ref 22–32)
Calcium: 8.9 mg/dL (ref 8.9–10.3)
Chloride: 106 mmol/L (ref 98–111)
Creatinine, Ser: 0.98 mg/dL (ref 0.44–1.00)
GFR calc Af Amer: >60 mL/min (ref >60)
GFR calc non Af Amer: >60 mL/min (ref >60)
Glucose, Bld: 95 mg/dL (ref 70–99)
Potassium: 3.5 mmol/L (ref 3.5–5.1)
Sodium: 138 mmol/L (ref 135–145)
Total Bilirubin: 0.7 mg/dL (ref 0.3–1.2)
Total Protein: 7 g/dL (ref 6.5–8.1)

## 2019-06-04 LAB — TROPONIN I: Troponin I: <0.03 ng/mL (ref <0.03)

## 2019-06-04 MED ORDER — LORAZEPAM 2 MG/ML IJ SOLN
1.0000 mg | Freq: Once | INTRAMUSCULAR | Status: AC
  Administered 2019-06-04: 1 mg via INTRAVENOUS
  Filled 2019-06-04: qty 1

## 2019-06-04 MED ORDER — ALBUTEROL SULFATE HFA 108 (90 BASE) MCG/ACT IN AERS
6.0000 | INHALATION_SPRAY | Freq: Once | RESPIRATORY_TRACT | Status: AC
  Administered 2019-06-04: 10:00:00 6 via RESPIRATORY_TRACT
  Filled 2019-06-04: qty 6.7

## 2019-06-04 NOTE — Discharge Instructions (Signed)
Use inhaler as needed.

## 2019-06-04 NOTE — ED Notes (Signed)
RN told pt to call her contact number again, pt said she would and then she went back to sleep.

## 2019-06-04 NOTE — Progress Notes (Signed)
CSW contacted by RN regarding taxi voucher. CSW reviewed patient's status and noted concerns with patient being sedated and patient's history of mental health concerns. CSW notes it would likely not be safe for patient to take public transit. CSW will provide cab voucher.  Lamonte Richer, LCSW, [place] Worker II [phone]

## 2019-06-04 NOTE — ED Triage Notes (Signed)
Pt arrive via ems with co of sob x3 days. Pt is out of her albuterol txt. Ems reports wheezing in all fields. Pts states she has had a cough since oct and has been tested for covid and has been neg. Ems gave IM epi .3mg . Was on NR 15L. pts conditioned improved. Denies chest pain, N/V. Hx of asthma and COPD. Current smoker.

## 2019-06-04 NOTE — ED Notes (Signed)
Pt given dc instructions. Pt verbalizes understanding. Pt states she is too sleepy to leave. Pt then states she needs to catch the bus. RN told pt to get dressed and try calling her contact person back.

## 2019-06-04 NOTE — ED Notes (Signed)
Pt states she used cocaine today and has done a lot of cocaine and weed this week. Pt also states she smokes tobacco.

## 2019-06-04 NOTE — ED Provider Notes (Signed)
[facility]ERG[facility] DEPARTMENT Provider Note   CSN: [id number] Arrival date & time: 06/04/19  4098    History   Chief Complaint Chief Complaint  Patient presents with  . Shortness of Breath    HPI Deborah Melendez is a 43 y.o. female.     The history is provided by the patient.  Shortness of Breath  Severity:  Mild Onset quality:  Gradual Timing:  Intermittent Progression:  Waxing and waning Chronicity:  Recurrent Context comment:  After inhaled cocaine use, hx of same with asthma history.  Relieved by:  Nothing Worsened by:  Nothing Associated symptoms: chest pain and wheezing   Associated symptoms: no abdominal pain, no cough, no ear pain, no fever, no rash, no sore throat and no vomiting     Past Medical History:  Diagnosis Date  . Asthma   . Bipolar 1 disorder, depressed, severe ([place]) 02/04/2017  . Cocaine abuse with cocaine-induced mood disorder ([place]) 07/27/2017  . COPD (chronic obstructive pulmonary disease) ([place])   . Depression   . Homicidal ideations   . Manic behavior ([place])   . 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 Suicidal ideation     Patient Active Problem List   Diagnosis Date Noted  . Symptomatic anemia 04/18/2019  . SOB (shortness of breath) 01/18/2019  . Acute asthma exacerbation 01/18/2019  . MDD (major depressive disorder), severe ([place]) 12/21/2018  . Asthma, chronic 12/21/2018  . Bipolar I disorder, most recent episode depressed ([place]) 12/21/2018  . Altered mental status   . Acute respiratory failure with hypoxia ([place]) 10/14/2018  . Bronchitis 10/05/2018  . Acute bronchitis with asthma with acute exacerbation 10/04/2018  . Major depressive disorder, recurrent episode with mixed features ([place]) 01/27/2018  . Tobacco use disorder 05/22/2017  . Suicidal  ideation   . Cannabis use disorder, severe, dependence ([place]) 01/20/2017  . Cocaine use disorder, severe, dependence ([place]) 08/08/2015    Past Surgical History:  Procedure Laterality Date  . FINGER SURGERY       OB History    Gravida  6   Para  4   Term      Preterm      AB      Living  4     SAB      TAB      Ectopic      Multiple      Live Births  0            Home Medications    Prior to Admission medications   Medication Sig Start Date End Date Taking? Authorizing Provider  acetaminophen (TYLENOL) 500 MG tablet Take 1,000 mg by mouth every 6 (six) hours as needed for mild pain or headache.    [provider]  albuterol (VENTOLIN HFA) 108 (90 Base) MCG/ACT inhaler Inhale 2 puffs into the lungs every 6 (six) hours as needed for wheezing or shortness of breath. 04/21/19   Isabelle Course, MD  benzonatate (TESSALON) 100 MG capsule Take 1 capsule (100 mg total) by mouth 3 (three) times daily. 04/21/19   Isabelle Course, MD  ferrous sulfate 325 (65 FE) MG tablet Take 1 tablet (325 mg total) by mouth daily with breakfast. 04/21/19   Isabelle Course, MD  FLUoxetine (PROZAC) 40 MG capsule Take 1 capsule (40 mg total) by mouth daily. 04/21/19   Donne Hazel,  Levester Fresh, MD  guaiFENesin-dextromethorphan [facility] DM) 100-10 MG/5ML syrup Take 10 mLs by mouth every 4 (four) hours while awake. 04/21/19   Isabelle Course, MD  ipratropium-albuterol (DUONEB) 0.5-2.5 (3) MG/3ML SOLN Take 3 mLs by nebulization every 4 (four) hours as needed. 04/21/19   Isabelle Course, MD  loratadine (CLARITIN) 10 MG tablet Take 1 tablet (10 mg total) by mouth daily. 01/19/19   Thurnell Lose, MD  predniSONE (DELTASONE) 5 MG tablet Label  & dispense according to the schedule below. take 8 Pills PO for 1 days, 6 Pills PO for 3 days, 4 Pills PO for 3 days, 2 Pills PO for 3 days, 1 Pills PO for 3 days, 1/2 Pill  PO for 3 days then STOP. Total 49 pills. 04/21/19   Isabelle Course, MD  QUEtiapine (SEROQUEL) 200  MG tablet Take 1 tablet (200 mg total) by mouth at bedtime. 04/21/19   Isabelle Course, MD  traZODone (DESYREL) 100 MG tablet Take 1 tablet (100 mg total) by mouth at bedtime as needed for sleep. 04/21/19   Isabelle Course, MD    Family History Family History  Problem Relation Age of Onset  . Diabetes Mother   . Hypertension Mother   . Drug abuse Father   . Schizophrenia Maternal Aunt     Social History Social History   Tobacco Use  . Smoking status: Current Every Day Smoker    Packs/day: 1.00    Types: Cigarettes  . Smokeless tobacco: Never Used  Substance Use Topics  . Alcohol use: Yes    Comment: pt denies drinking at this time  . Drug use: Yes    Frequency: 1.0 times per week    Types: Cocaine, Marijuana    Comment:  last used cocaine today     Allergies   Patient has no known allergies.   Review of Systems Review of Systems  Constitutional: Negative for chills and fever.  HENT: Negative for ear pain and sore throat.   Eyes: Negative for pain and visual disturbance.  Respiratory: Positive for shortness of breath and wheezing. Negative for cough.   Cardiovascular: Positive for chest pain. Negative for palpitations.  Gastrointestinal: Negative for abdominal pain and vomiting.  Genitourinary: Negative for dysuria and hematuria.  Musculoskeletal: Negative for arthralgias and back pain.  Skin: Negative for color change and rash.  Neurological: Negative for seizures and syncope.  All other systems reviewed and are negative.    Physical Exam Updated Vital Signs BP (!) 161/143   Pulse (!) 104   Temp 98.1 F (36.7 C) (Oral)   Resp (!) 39   SpO2 95%   Physical Exam Vitals signs and nursing note reviewed.  Constitutional:      General: She is not in acute distress.    Appearance: She is well-developed.  HENT:     Head: Normocephalic and atraumatic.     Mouth/Throat:     Mouth: Mucous membranes are moist.     Pharynx: Oropharynx is clear.  Eyes:      Extraocular Movements: Extraocular movements intact.     Conjunctiva/sclera: Conjunctivae normal.     Pupils: Pupils are equal, round, and reactive to light.  Neck:     Musculoskeletal: Normal range of motion and neck supple.  Cardiovascular:     Rate and Rhythm: Normal rate and regular rhythm.     Heart sounds: No murmur.  Pulmonary:     Effort: Pulmonary effort is normal. No tachypnea or respiratory  distress.     Breath sounds: Wheezing present. No rhonchi or rales.  Abdominal:     Palpations: Abdomen is soft.     Tenderness: There is no abdominal tenderness.  Skin:    General: Skin is warm and dry.  Neurological:     Mental Status: She is alert.      ED Treatments / Results  Labs (all labs ordered are listed, but only abnormal results are displayed) Labs Reviewed  CBC WITH DIFFERENTIAL/PLATELET - Abnormal; Notable for the following components:      Result Value   RBC 3.50 (*)    Hemoglobin 8.0 (*)    HCT 28.6 (*)    MCH 22.9 (*)    MCHC 28.0 (*)    RDW 24.2 (*)    All other components within normal limits  COMPREHENSIVE METABOLIC PANEL  TROPONIN I    EKG EKG Interpretation  Date/Time:  Sunday June 04 2019 09:53:32 EDT Ventricular Rate:  105 PR Interval:    QRS Duration: 98 QT Interval:  350 QTC Calculation: 463 R Axis:   89 Text Interpretation:  Sinus tachycardia Right atrial enlargement Confirmed by Lennice Sites [phone]) on 06/04/2019 10:08:25 AM   Radiology Dg Chest Portable 1 View  Result Date: 06/04/2019 CLINICAL DATA:  Pt arrive via ems with co of sob x3 days. Pt is out of her albuterol txt. Ems reports wheezing in all fields. Pts states she has had a cough since oct and has been tested for covid and has been neg EXAM: PORTABLE CHEST 1 VIEW COMPARISON:  04/18/2019 FINDINGS: Numerous leads and wires project over the chest. Midline trachea. Normal heart size and mediastinal contours. No pleural effusion or pneumothorax. Clear lungs. Azygos fissure.  IMPRESSION: Normal chest. Electronically Signed   By: Abigail Miyamoto M.D.   On: 06/04/2019 11:08    Procedures Procedures (including critical care time)  Medications Ordered in ED Medications  LORazepam (ATIVAN) injection 1 mg (1 mg Intravenous Given 06/04/19 1023)  albuterol (VENTOLIN HFA) 108 (90 Base) MCG/ACT inhaler 6 puff (6 puffs Inhalation Given 06/04/19 1024)     Initial Impression / Assessment and Plan / ED Course  I have reviewed the triage vital signs and the nursing notes.  Pertinent labs & imaging results that were available during my care of the patient were reviewed by me and considered in my medical decision making (see chart for details).     Deborah Melendez is a 43 year old female with history of cocaine abuse, asthma who presents to the ED with shortness of breath.  Patient states that symptoms started after using cocaine.  Does not have major chest pain, has some shortness of breath.  Patient stable on room air.  She appears anxious.  Patient with mild wheezing on exam.  Not compliant with her asthma medication.  EKG shows sinus rhythm.  Troponin normal.  Chest x-ray shows no signs of pneumonia, pneumothorax, pleural effusion.  Patient felt improved following Ativan.  Was able to eat and drink without any issues.  Suspect mild asthma exacerbation secondary to inhaled cocaine use.  No concern for cardiac process.  Patient does not have any hypoxia, shortness of breath after reevaluation.  No concern for PE given history and physical.  Educated about drug use.  Discharged from ED in good condition.  Patient was able to eat and drink without any issues.  This chart was dictated using voice recognition software.  Despite best efforts to proofread,  errors can occur which can change  the documentation meaning.    Final Clinical Impressions(s) / ED Diagnoses   Final diagnoses:  Cocaine use  Mild asthma, unspecified whether complicated, unspecified whether persistent     ED Discharge Orders    None       Lennice Sites, DO 06/04/19 1154

## 2019-06-04 NOTE — ED Notes (Signed)
[facility] called for pt

## 2019-06-26 ENCOUNTER — Other Ambulatory Visit: Payer: Self-pay

## 2019-06-26 ENCOUNTER — Ambulatory Visit: Payer: Self-pay | Attending: Family Medicine | Admitting: Family Medicine

## 2019-08-18 ENCOUNTER — Ambulatory Visit: Payer: Self-pay | Attending: Nurse Practitioner | Admitting: Nurse Practitioner

## 2019-08-18 ENCOUNTER — Ambulatory Visit: Payer: Self-pay | Admitting: Internal Medicine

## 2019-08-18 ENCOUNTER — Encounter: Payer: Self-pay | Admitting: Nurse Practitioner

## 2019-08-18 ENCOUNTER — Other Ambulatory Visit: Payer: Self-pay

## 2019-08-18 DIAGNOSIS — J4521 Mild intermittent asthma with (acute) exacerbation: Secondary | ICD-10-CM

## 2019-08-18 DIAGNOSIS — F17200 Nicotine dependence, unspecified, uncomplicated: Secondary | ICD-10-CM

## 2019-08-18 MED ORDER — FLUTICASONE-SALMETEROL 100-50 MCG/DOSE IN AEPB: 1.0000 | INHALATION_SPRAY | Freq: Two times a day (BID) | RESPIRATORY_TRACT | 3 refills | Status: DC

## 2019-08-18 MED ORDER — LORATADINE 10 MG PO TABS: 10.0000 mg | ORAL_TABLET | Freq: Every day | ORAL | 0 refills | Status: DC

## 2019-08-18 MED ORDER — BENZONATATE 100 MG PO CAPS: 100.0000 mg | ORAL_CAPSULE | Freq: Three times a day (TID) | ORAL | 0 refills | Status: DC

## 2019-08-18 MED ORDER — IPRATROPIUM-ALBUTEROL 0.5-2.5 (3) MG/3ML IN SOLN: 3.0000 mL | RESPIRATORY_TRACT | 0 refills | Status: DC | PRN

## 2019-08-18 MED FILL — IPRAT-ALBUT 0.5-3(2.5) MG/3: 0.5-2.5 (3) | 20 days supply | Qty: 360 | Fill #0

## 2019-08-18 MED FILL — BENZONATATE 100 MG CAPS: 100 | 6 days supply | Qty: 20 | Fill #0

## 2019-08-18 MED FILL — !ADVAIR 100/50 DISKUS: 100-50 | 30 days supply | Qty: 60 | Fill #0

## 2019-08-18 NOTE — Progress Notes (Signed)
Virtual Visit via Telephone Note Due to national recommendations of social distancing due to [place] 19, telehealth visit is felt to be most appropriate for this patient at this time.  I discussed the limitations, risks, security and privacy concerns of performing an evaluation and management service by telephone and the availability of in person appointments. I also discussed with the patient that there may be a patient responsible charge related to this service. The patient expressed understanding and agreed to proceed.    I connected with Deborah Melendez on 08/18/19  at   9:30 AM EDT  EDT by telephone and verified that I am speaking with the correct person using two identifiers.   Consent I discussed the limitations, risks, security and privacy concerns of performing an evaluation and management service by telephone and the availability of in person appointments. I also discussed with the patient that there may be a patient responsible charge related to this service. The patient expressed understanding and agreed to proceed.   Location of Patient: Private  Residence   Location of Provider: [place] and [employer] Office    Persons participating in Telemedicine visit: Deborah Rankins FNP-BC [place]    History of Present Illness: Telemedicine visit for: ASTHMA  has a past medical history of Asthma, Bipolar 1 disorder, depressed, severe ([place]) (02/04/2017), Cocaine abuse with cocaine-induced mood disorder ([place]) (07/27/2017), COPD (chronic obstructive pulmonary disease) ([place]), Depression, Homicidal ideations, Manic behavior ([place]), MDD (major depressive disorder), recurrent severe, without psychosis ([place]) (08/08/2015), Substance or medication-induced bipolar and related disorder with onset during intoxication ([place]) (09/08/2016), and Suicidal ideation.  Asthma Exacerbation She reports symptoms of asthma exacerbation. Appears to be noncompliant with  office appointments in the past to estabish with a PCP as well as with using her inhalers and psychotropic medications. History of numerous ED visits and Hospital admissions for asthma exacerbation and acute Resp. failure. She continues to smoke 1ppd.  Asthma symptoms today include nonproductive cough, wheezing and shortness of breath.  Suspected precipitants include pollens, smoke and seasonal weather changes, humidity.  Symptoms have been gradually worsening over the past 2 weeks. Her last ED visit was for cocaine use and asthma exacerbation 06-04-2019.  This is the first evaluation that has occurred since that exacerbation. Will refill nebs, albuterol and start advair today. She was previously on flovent last year but reports she wasn't sure if it was actually helping.  Denies chest pain, palpitations, lightheadedness, dizziness, headaches or BLE edema.   Past Medical History:  Diagnosis Date  . Asthma   . Bipolar 1 disorder, depressed, severe ([place]) 02/04/2017  . Cocaine abuse with cocaine-induced mood disorder ([place]) 07/27/2017  . COPD (chronic obstructive pulmonary disease) ([place])   . Depression   . Homicidal ideations   . Manic behavior ([place])   . 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 Suicidal ideation     Past Surgical History:  Procedure Laterality Date  . FINGER SURGERY      Family History  Problem Relation Age of Onset  . Diabetes Mother   . Hypertension Mother   . Drug abuse Father   . Schizophrenia Maternal Aunt     Social History   Socioeconomic History  . Marital status: Single    Spouse name: Not on file  . Number of children: Not on file  . Years of education: Not on  file  . Highest education level: Not on file  Occupational History  . Not on file  Social Needs  . Financial resource strain: Not on file  . Food  insecurity    Worry: Not on file    Inability: Not on file  . Transportation needs    Medical: Not on file    Non-medical: Not on file  Tobacco Use  . Smoking status: Current Every Day Smoker    Packs/day: 1.00    Types: Cigarettes  . Smokeless tobacco: Never Used  Substance and Sexual Activity  . Alcohol use: Yes    Comment: pt denies drinking at this time  . Drug use: Yes    Frequency: 1.0 times per week    Types: Cocaine, Marijuana    Comment:  last used cocaine today  . Sexual activity: Yes    Birth control/protection: None  Lifestyle  . Physical activity    Days per week: Not on file    Minutes per session: Not on file  . Stress: Not on file  Relationships  . Social Herbalist on phone: Not on file    Gets together: Not on file    Attends religious service: Not on file    Active member of club or organization: Not on file    Attends meetings of clubs or organizations: Not on file    Relationship status: Not on file  Other Topics Concern  . Not on file  Social History Narrative  . Not on file     Observations/Objective: Awake, alert and oriented x 3   Review of Systems  Constitutional: Negative for fever, malaise/fatigue and weight loss.  HENT: Negative.  Negative for nosebleeds.   Eyes: Negative.  Negative for blurred vision, double vision and photophobia.  Respiratory: Positive for cough, shortness of breath and wheezing. Negative for hemoptysis and sputum production.   Cardiovascular: Negative.  Negative for chest pain, palpitations and leg swelling.  Gastrointestinal: Negative.  Negative for heartburn, nausea and vomiting.  Musculoskeletal: Negative.  Negative for myalgias.  Neurological: Negative.  Negative for dizziness, focal weakness, seizures and headaches.  Psychiatric/Behavioral: Positive for depression and substance abuse. Negative for hallucinations, memory loss and suicidal ideas. The patient is nervous/anxious and has insomnia.      Assessment and Plan: Deborah Melendez was seen today for asthma.  Diagnoses and all orders for this visit:  Mild intermittent chronic asthma with acute exacerbation -     ipratropium-albuterol (DUONEB) 0.5-2.5 (3) MG/3ML SOLN; Take 3 mLs by nebulization every 4 (four) hours as needed. -     benzonatate (TESSALON) 100 MG capsule; Take 1 capsule (100 mg total) by mouth 3 (three) times daily. -     Fluticasone-Salmeterol (ADVAIR) 100-50 MCG/DOSE AEPB; Inhale 1 puff into the lungs 2 (two) times daily. -     loratadine (CLARITIN) 10 MG tablet; Take 1 tablet (10 mg total) by mouth daily.  Tobacco use disorder Deborah Melendez was counseled on the dangers of tobacco use, and was advised to quit. Reviewed strategies to maximize success, including removing cigarettes and smoking materials from environment, stress management and support of family/friends as well as pharmacological alternatives including: Wellbutrin, Chantix, Nicotine patch, Nicotine gum or lozenges. Smoking cessation support: smoking cessation hotline: 1-800-QUIT-NOW.  Smoking cessation classes are also available through [facility]. Call 364-526-3586 or visit our website at [email or web address]   A total of 2 minutes was spent on counseling for smoking cessation and Deborah Melendez is not  ready to quit.      Follow Up Instructions Return in about 4 weeks (around 09/15/2019) for ESTABLISH CARE.    I discussed the assessment and treatment plan with the patient. The patient was provided an opportunity to ask questions and all were answered. The patient agreed with the plan and demonstrated an understanding of the instructions.   The patient was advised to call back or seek an in-person evaluation if the symptoms worsen or if the condition fails to improve as anticipated.  I provided 18 `minutes of non-face-to-face time during this encounter including median intraservice time, reviewing previous notes, labs, imaging, medications  and explaining diagnosis and management.  Gildardo Pounds, FNP-BC

## 2019-08-19 ENCOUNTER — Encounter: Payer: Self-pay | Admitting: Nurse Practitioner

## 2019-09-11 ENCOUNTER — Ambulatory Visit: Payer: Self-pay | Admitting: Family Medicine

## 2019-10-03 ENCOUNTER — Emergency Department (HOSPITAL_COMMUNITY): Payer: Self-pay

## 2019-10-03 ENCOUNTER — Inpatient Hospital Stay (HOSPITAL_COMMUNITY)
Admission: EM | Admit: 2019-10-03 | Discharge: 2019-10-07 | DRG: 202 | Disposition: A | Discharge status: Home / Self Care | Payer: Self-pay | Attending: Internal Medicine | Admitting: Internal Medicine

## 2019-10-03 ENCOUNTER — Other Ambulatory Visit: Payer: Self-pay

## 2019-10-03 ENCOUNTER — Encounter (HOSPITAL_COMMUNITY): Payer: Self-pay | Admitting: Emergency Medicine

## 2019-10-03 DIAGNOSIS — R6510 Systemic inflammatory response syndrome (SIRS) of non-infectious origin without acute organ dysfunction: Secondary | ICD-10-CM | POA: Diagnosis present

## 2019-10-03 DIAGNOSIS — J209 Acute bronchitis, unspecified: Secondary | ICD-10-CM | POA: Diagnosis present

## 2019-10-03 DIAGNOSIS — J4521 Mild intermittent asthma with (acute) exacerbation: Secondary | ICD-10-CM

## 2019-10-03 DIAGNOSIS — N739 Female pelvic inflammatory disease, unspecified: Secondary | ICD-10-CM | POA: Diagnosis present

## 2019-10-03 DIAGNOSIS — F314 Bipolar disorder, current episode depressed, severe, without psychotic features: Secondary | ICD-10-CM | POA: Diagnosis present

## 2019-10-03 DIAGNOSIS — F1410 Cocaine abuse, uncomplicated: Secondary | ICD-10-CM | POA: Diagnosis present

## 2019-10-03 DIAGNOSIS — D259 Leiomyoma of uterus, unspecified: Secondary | ICD-10-CM | POA: Diagnosis present

## 2019-10-03 DIAGNOSIS — Z20828 Contact with and (suspected) exposure to other viral communicable diseases: Secondary | ICD-10-CM | POA: Diagnosis present

## 2019-10-03 DIAGNOSIS — D62 Acute posthemorrhagic anemia: Secondary | ICD-10-CM | POA: Diagnosis present

## 2019-10-03 DIAGNOSIS — Z23 Encounter for immunization: Secondary | ICD-10-CM

## 2019-10-03 DIAGNOSIS — R509 Fever, unspecified: Secondary | ICD-10-CM

## 2019-10-03 DIAGNOSIS — R112 Nausea with vomiting, unspecified: Secondary | ICD-10-CM | POA: Diagnosis present

## 2019-10-03 DIAGNOSIS — J202 Acute bronchitis due to streptococcus: Principal | ICD-10-CM | POA: Diagnosis present

## 2019-10-03 DIAGNOSIS — F17210 Nicotine dependence, cigarettes, uncomplicated: Secondary | ICD-10-CM | POA: Diagnosis present

## 2019-10-03 DIAGNOSIS — Z8249 Family history of ischemic heart disease and other diseases of the circulatory system: Secondary | ICD-10-CM

## 2019-10-03 DIAGNOSIS — J020 Streptococcal pharyngitis: Secondary | ICD-10-CM | POA: Diagnosis present

## 2019-10-03 DIAGNOSIS — D649 Anemia, unspecified: Secondary | ICD-10-CM | POA: Diagnosis present

## 2019-10-03 DIAGNOSIS — R45851 Suicidal ideations: Secondary | ICD-10-CM | POA: Diagnosis present

## 2019-10-03 DIAGNOSIS — N920 Excessive and frequent menstruation with regular cycle: Secondary | ICD-10-CM | POA: Diagnosis present

## 2019-10-03 DIAGNOSIS — R1032 Left lower quadrant pain: Secondary | ICD-10-CM

## 2019-10-03 DIAGNOSIS — Z833 Family history of diabetes mellitus: Secondary | ICD-10-CM

## 2019-10-03 DIAGNOSIS — Z90710 Acquired absence of both cervix and uterus: Secondary | ICD-10-CM

## 2019-10-03 DIAGNOSIS — F3130 Bipolar disorder, current episode depressed, mild or moderate severity, unspecified: Secondary | ICD-10-CM

## 2019-10-03 DIAGNOSIS — J45901 Unspecified asthma with (acute) exacerbation: Secondary | ICD-10-CM | POA: Diagnosis present

## 2019-10-03 LAB — LACTIC ACID, PLASMA: Lactic Acid, Venous: 1.3 mmol/L (ref 0.5–1.9)

## 2019-10-03 LAB — CBC WITH DIFFERENTIAL/PLATELET
Abs Immature Granulocytes: 0.11 10*3/uL — ABNORMAL HIGH (ref 0.00–0.07)
Basophils Absolute: 0 10*3/uL (ref 0.0–0.1)
Basophils Relative: 0 %
Eosinophils Absolute: 0 10*3/uL (ref 0.0–0.5)
Eosinophils Relative: 0 %
HCT: 23.5 % — ABNORMAL LOW (ref 36.0–46.0)
Hemoglobin: 6.5 g/dL — CL (ref 12.0–15.0)
Immature Granulocytes: 1 %
Lymphocytes Relative: 8 %
Lymphs Abs: 1.2 10*3/uL (ref 0.7–4.0)
MCH: 19.5 pg — ABNORMAL LOW (ref 26.0–34.0)
MCHC: 27.7 g/dL — ABNORMAL LOW (ref 30.0–36.0)
MCV: 70.4 fL — ABNORMAL LOW (ref 80.0–100.0)
Monocytes Absolute: 1 10*3/uL (ref 0.1–1.0)
Monocytes Relative: 6 %
Neutro Abs: 13.6 10*3/uL — ABNORMAL HIGH (ref 1.7–7.7)
Neutrophils Relative %: 85 %
Platelets: 366 10*3/uL (ref 150–400)
RBC: 3.34 MIL/uL — ABNORMAL LOW (ref 3.87–5.11)
RDW: 21 % — ABNORMAL HIGH (ref 11.5–15.5)
WBC: 16 10*3/uL — ABNORMAL HIGH (ref 4.0–10.5)
nRBC: 0 % (ref 0.0–0.2)

## 2019-10-03 LAB — COMPREHENSIVE METABOLIC PANEL
ALT: 8 U/L (ref 0–44)
AST: 15 U/L (ref 15–41)
Albumin: 3.3 g/dL — ABNORMAL LOW (ref 3.5–5.0)
Alkaline Phosphatase: 65 U/L (ref 38–126)
Anion gap: 11 (ref 5–15)
BUN: 6 mg/dL (ref 6–20)
CO2: 20 mmol/L — ABNORMAL LOW (ref 22–32)
Calcium: 8.7 mg/dL — ABNORMAL LOW (ref 8.9–10.3)
Chloride: 102 mmol/L (ref 98–111)
Creatinine, Ser: 1.03 mg/dL — ABNORMAL HIGH (ref 0.44–1.00)
GFR calc Af Amer: >60 mL/min (ref >60)
GFR calc non Af Amer: >60 mL/min (ref >60)
Glucose, Bld: 121 mg/dL — ABNORMAL HIGH (ref 70–99)
Potassium: 3.4 mmol/L — ABNORMAL LOW (ref 3.5–5.1)
Sodium: 133 mmol/L — ABNORMAL LOW (ref 135–145)
Total Bilirubin: 0.6 mg/dL (ref 0.3–1.2)
Total Protein: 7.3 g/dL (ref 6.5–8.1)

## 2019-10-03 LAB — URINALYSIS, ROUTINE W REFLEX MICROSCOPIC
Bilirubin Urine: NEGATIVE
Glucose, UA: NEGATIVE mg/dL
Ketones, ur: 5 mg/dL — AB
Leukocytes,Ua: NEGATIVE
Nitrite: NEGATIVE
Protein, ur: NEGATIVE mg/dL
Specific Gravity, Urine: 1.013 (ref 1.005–1.030)
pH: 8 (ref 5.0–8.0)

## 2019-10-03 LAB — I-STAT BETA HCG BLOOD, ED (MC, WL, AP ONLY): I-stat hCG, quantitative: <5 m[IU]/mL (ref <5)

## 2019-10-03 MED ORDER — ONDANSETRON HCL 4 MG/2ML IJ SOLN
4.0000 mg | Freq: Once | INTRAMUSCULAR | Status: AC
  Administered 2019-10-04: 4 mg via INTRAVENOUS
  Filled 2019-10-03: qty 2

## 2019-10-03 MED ORDER — SODIUM CHLORIDE 0.9 % IV BOLUS
1000.0000 mL | Freq: Once | INTRAVENOUS | Status: AC
  Administered 2019-10-04: 1000 mL via INTRAVENOUS

## 2019-10-03 MED ORDER — SODIUM CHLORIDE 0.9% FLUSH
3.0000 mL | Freq: Once | INTRAVENOUS | Status: AC
  Administered 2019-10-04: 3 mL via INTRAVENOUS

## 2019-10-03 MED ORDER — MORPHINE SULFATE (PF) 4 MG/ML IV SOLN
4.0000 mg | Freq: Once | INTRAVENOUS | Status: AC
  Administered 2019-10-04: 4 mg via INTRAVENOUS
  Filled 2019-10-03: qty 1

## 2019-10-03 MED ORDER — ACETAMINOPHEN 325 MG PO TABS
650.0000 mg | ORAL_TABLET | Freq: Once | ORAL | Status: AC
  Administered 2019-10-04: 650 mg via ORAL
  Filled 2019-10-03: qty 2

## 2019-10-03 NOTE — ED Triage Notes (Signed)
Pt reports lower left abdominal pain, fever, sob, weakness X2 days.

## 2019-10-03 NOTE — ED Provider Notes (Signed)
[facility] EMERGENCY DEPARTMENT Provider Note   CSN: [id number] Arrival date & time: 10/03/19  2149     History   Chief Complaint Chief Complaint  Patient presents with   Fever   Abdominal Pain   Cough    HPI Deborah Melendez is a 43 y.o. female.     HPI  This is a 43 year old female with a history of bipolar disorder, polysubstance abuse, symptomatic anemia who presents with 2-day history of left lower quadrant pain and back pain.  Patient reports the pain is sharp nonradiating.  Rates her pain 8 out of 10.  She has not taken anything at home for the pain.  She denies dysuria or hematuria.  She denies constipation or diarrhea.  She does report nausea and vomiting.  Patient noted to have a fever in triage.  Patient is unaware that she had a fever at home.  Denies any sick contacts.  Patient has never had pain like this in the past.  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   Substance or medication-induced bipolar and related disorder with onset during intoxication ([place]) 09/08/2016   Suicidal ideation     Patient Active Problem List   Diagnosis Date Noted   Symptomatic anemia 04/18/2019   SOB (shortness of breath) 01/18/2019   Acute asthma exacerbation 01/18/2019   MDD (major depressive disorder), severe ([place]) 12/21/2018   Asthma, chronic 12/21/2018   Bipolar I disorder, most recent episode depressed ([place]) 12/21/2018   Altered mental status    Acute respiratory failure with hypoxia ([place]) 10/14/2018   Bronchitis 10/05/2018   Acute bronchitis with asthma with acute exacerbation 10/04/2018    Major depressive disorder, recurrent episode with mixed features ([place]) 01/27/2018   Tobacco use disorder 05/22/2017   Suicidal ideation    Cannabis use disorder, severe, dependence ([place]) 01/20/2017   Cocaine use disorder, severe, dependence ([place]) 08/08/2015    Past Surgical History:  Procedure Laterality Date   FINGER SURGERY       OB History    Gravida  6   Para  4   Term      Preterm      AB      Living  4     SAB      TAB      Ectopic      Multiple      Live Births  0            Home Medications    Prior to Admission medications   Medication Sig Start Date End Date Taking? Authorizing Provider  albuterol (VENTOLIN HFA) 108 (90 Base) MCG/ACT inhaler Inhale 2 puffs into the lungs every 6 (six) hours as needed for wheezing or shortness of breath. Patient not taking: Reported on 10/04/2019 04/21/19   Isabelle Course, MD  benzonatate (TESSALON) 100 MG capsule Take 1 capsule (100 mg total) by mouth 3 (three) times daily. Patient not taking: Reported on 10/04/2019 08/18/19   Gildardo Pounds, NP  ferrous sulfate 325 (65 FE) MG tablet Take 1 tablet (325 mg total) by mouth daily with breakfast. Patient not taking: Reported on 10/04/2019 04/21/19  Isabelle Course, MD  Fluticasone-Salmeterol (ADVAIR) 100-50 MCG/DOSE AEPB Inhale 1 puff into the lungs 2 (two) times daily. Patient not taking: Reported on 10/04/2019 08/18/19   Gildardo Pounds, NP  ipratropium-albuterol (DUONEB) 0.5-2.5 (3) MG/3ML SOLN Take 3 mLs by nebulization every 4 (four) hours as needed. Patient not taking: Reported on 10/04/2019 08/18/19   Gildardo Pounds, NP  loratadine (CLARITIN) 10 MG tablet Take 1 tablet (10 mg total) by mouth daily. Patient not taking: Reported on 10/04/2019 08/18/19   Gildardo Pounds, NP  traZODone (DESYREL) 100 MG tablet Take 1 tablet (100 mg total) by mouth at bedtime as needed for sleep. Patient not taking: Reported on 10/04/2019 04/21/19   Isabelle Course, MD    Family  History Family History  Problem Relation Age of Onset   Diabetes Mother    Hypertension Mother    Drug abuse Father    Schizophrenia Maternal Aunt     Social History Social History   Tobacco Use   Smoking status: Current Every Day Smoker    Packs/day: 1.00    Types: Cigarettes   Smokeless tobacco: Never Used  Substance Use Topics   Alcohol use: Yes    Comment: pt denies drinking at this time   Drug use: Yes    Frequency: 1.0 times per week    Types: Cocaine, Marijuana    Comment:  last used cocaine today     Allergies   Patient has no known allergies.   Review of Systems Review of Systems  Constitutional: Positive for fever.  Respiratory: Negative for shortness of breath.   Cardiovascular: Negative for chest pain.  Gastrointestinal: Positive for abdominal pain, nausea and vomiting. Negative for constipation and diarrhea.  Genitourinary: Negative for dysuria and hematuria.  All other systems reviewed and are negative.    Physical Exam Updated Vital Signs BP 115/73    Pulse (!) 103    Temp 99.2 F (37.3 C) (Oral)    Resp (!) 23    SpO2 100%   Physical Exam Vitals signs and nursing note reviewed.  Constitutional:      Appearance: She is well-developed.     Comments: Chronically ill-appearing and appears older than stated age  HENT:     Head: Normocephalic and atraumatic.  Eyes:     Pupils: Pupils are equal, round, and reactive to light.  Neck:     Musculoskeletal: Neck supple.  Cardiovascular:     Rate and Rhythm: Regular rhythm. Tachycardia present.     Heart sounds: Normal heart sounds.  Pulmonary:     Effort: Pulmonary effort is normal. No respiratory distress.     Breath sounds: No wheezing.  Abdominal:     General: Bowel sounds are normal.     Palpations: Abdomen is soft.     Tenderness: There is abdominal tenderness in the left lower quadrant. There is no right CVA tenderness, left CVA tenderness, guarding or rebound.  Genitourinary:     Vagina: Normal.     Comments: Slight bloody discharge noticed, markedly enlarged uterus, slight tender to palpation Skin:    General: Skin is warm and dry.  Neurological:     Mental Status: She is alert and oriented to person, place, and time.  Psychiatric:        Mood and Affect: Mood normal.      ED Treatments / Results  Labs (all labs ordered are listed, but only abnormal results are displayed) Labs Reviewed  WET PREP, GENITAL - Abnormal;  Notable for the following components:      Result Value   WBC, Wet Prep HPF POC MANY (*)    All other components within normal limits  COMPREHENSIVE METABOLIC PANEL - Abnormal; Notable for the following components:   Sodium 133 (*)    Potassium 3.4 (*)    CO2 20 (*)    Glucose, Bld 121 (*)    Creatinine, Ser 1.03 (*)    Calcium 8.7 (*)    Albumin 3.3 (*)    All other components within normal limits  CBC WITH DIFFERENTIAL/PLATELET - Abnormal; Notable for the following components:   WBC 16.0 (*)    RBC 3.34 (*)    Hemoglobin 6.5 (*)    HCT 23.5 (*)    MCV 70.4 (*)    MCH 19.5 (*)    MCHC 27.7 (*)    RDW 21.0 (*)    Neutro Abs 13.6 (*)    Abs Immature Granulocytes 0.11 (*)    All other components within normal limits  URINALYSIS, ROUTINE W REFLEX MICROSCOPIC - Abnormal; Notable for the following components:   Hgb urine dipstick SMALL (*)    Ketones, ur 5 (*)    Bacteria, UA RARE (*)    All other components within normal limits  RAPID URINE DRUG SCREEN, HOSP PERFORMED - Abnormal; Notable for the following components:   Cocaine POSITIVE (*)    Tetrahydrocannabinol POSITIVE (*)    All other components within normal limits  SARS CORONAVIRUS 2 (TAT 6-24 HRS)  URINE CULTURE  WET PREP, GENITAL  GROUP A STREP BY PCR  LACTIC ACID, PLASMA  LACTIC ACID, PLASMA  INFLUENZA PANEL BY PCR (TYPE A & B)  I-STAT BETA HCG BLOOD, ED ([facility], [facility], [facility] ONLY)  TYPE AND SCREEN  PREPARE RBC (CROSSMATCH)  GC/CHLAMYDIA PROBE AMP ([place]) NOT AT [facility]      EKG EKG Interpretation  Date/Time:  Tuesday October 03 2019 22:00:59 EDT Ventricular Rate:  132 PR Interval:  122 QRS Duration: 88 QT Interval:  292 QTC Calculation: 432 R Axis:   80 Text Interpretation:  Sinus tachycardia Right atrial enlargement Minimal voltage criteria for LVH, may be normal variant ( Sokolow-Lyon ) ST & T wave abnormality, consider inferior ischemia Abnormal ECG Likely rate related changes Confirmed by Thayer Jew [phone]) on 10/03/2019 11:01:09 PM   Radiology Dg Chest 2 View  Result Date: 10/03/2019 CLINICAL DATA:  Shortness of breath EXAM: CHEST - 2 VIEW COMPARISON:  June 04, 2019 FINDINGS: The heart size and mediastinal contours are within normal limits. Both lungs are clear. The visualized skeletal structures are unremarkable. IMPRESSION: No acute cardiopulmonary process. Electronically Signed   By: Prudencio Pair M.D.   On: 10/03/2019 23:01   Ct Abdomen Pelvis W Contrast  Result Date: 10/04/2019 CLINICAL DATA:  Abdominal pain left lower quadrant. Fever and shortness of breath. EXAM: CT ABDOMEN AND PELVIS WITH CONTRAST TECHNIQUE: Multidetector CT imaging of the abdomen and pelvis was performed using the standard protocol following bolus administration of intravenous contrast. CONTRAST:  172mL OMNIPAQUE IOHEXOL 300 MG/ML  SOLN COMPARISON:  None. FINDINGS: LOWER CHEST: No basilar pleural or apical pericardial effusion. HEPATOBILIARY: Normal hepatic contours. There is no intra- or extrahepatic biliary dilatation. The gallbladder is normal. PANCREAS: Normal pancreatic contours without pancreatic ductal dilatation or peripancreatic fluid collection. SPLEEN: Normal. ADRENALS/URINARY TRACT: --Adrenal glands: Normal. --Right kidney/ureter: Increased medullary density --Left kidney/ureter: Increased medullary density --Urinary bladder: Normal for degree of distention STOMACH/BOWEL: --Stomach/Duodenum: There is no hiatal hernia. The  duodenal course and caliber are normal.  --Small bowel: No dilatation or inflammation. --Colon: No focal abnormality. --Appendix: Normal. VASCULAR/LYMPHATIC: Normal course and caliber of the major abdominal vessels. No abdominal or pelvic lymphadenopathy. REPRODUCTIVE: There is a large pelvic mass arising from the uterine fundus measuring 13.4 x 13.6 x 10.3 cm. 4.5 cm right adnexal cyst and 4.2 cm left adnexal cyst. MUSCULOSKELETAL. No bony spinal canal stenosis or focal osseous abnormality. OTHER: None. IMPRESSION: 1. No acute abdominal or pelvic abnormality. 2. Large fibroid arising from the uterine fundus, measuring up to 13.4 cm. 3. Bilateral adnexal cysts, measuring up to 4.5 cm on the right and 4.2 cm on the left. Electronically Signed   By: Ulyses Jarred M.D.   On: 10/04/2019 01:17   US Pelvic Complete W Transvaginal And Torsion R/o  Result Date: 10/04/2019 CLINICAL DATA:  Left lower quadrant pain EXAM: TRANSABDOMINAL AND TRANSVAGINAL ULTRASOUND OF PELVIS DOPPLER ULTRASOUND OF OVARIES TECHNIQUE: Both transabdominal and transvaginal ultrasound examinations of the pelvis were performed. Transabdominal technique was performed for global imaging of the pelvis including uterus, ovaries, adnexal regions, and pelvic cul-de-sac. It was necessary to proceed with endovaginal exam following the transabdominal exam to visualize the ovaries and adnexa. Color and duplex Doppler ultrasound was utilized to evaluate blood flow to the ovaries. COMPARISON:  CT 10/04/2019, ultrasound 07/15/2015 FINDINGS: Uterus Measurements: 16.9 x 10.6 x 11.9 cm = volume: 1116.9 mL. Large solid mass within the central and fundal uterus measuring approximately 13.5 x 10.2 by 13.5 cm, corresponding to large mass on CT. Endometrium Could not be evaluated secondary to large uterine mass. Right ovary Measurements: 6 x 3.5 x 3.3 cm = volume: 37 mL. Cyst measuring 4.4 by 2.5 x 2.7 cm. Left ovary Measurements: 4.7 x 3.5 x 4 cm = volume: 34.3 mL. Cyst measuring 3.3 x 2.6 by 4.4 cm. Pulsed  Doppler evaluation of both ovaries demonstrates normal low-resistance arterial and venous waveforms. Other findings No abnormal free fluid. IMPRESSION: 1. Markedly enlarged uterus with dominant solid mass occupying the central and fundal portion of the uterus, measuring up to 13.5 cm, presumably a large fibroid. Endometrium was obscured by the large uterine mass. 2. Negative for ovarian torsion 3. Bilateral renal cysts measuring up to 4.4 cm. Assuming premenopausal patient, this is almost certainly benign, and no specific imaging follow up is recommended according to the Society of Radiologists in [place] (D Clovis Riley et al. Management of Asymptomatic Ovarian and Other Adnexal Cysts Imaged at [place]: Society of Radiologists in [place] Statement 2010. Radiology 256 (Sept 2010): [id number].). Electronically Signed   By: Donavan Foil M.D.   On: 10/04/2019 02:54    Procedures Procedures (including critical care time)  CRITICAL CARE Performed by: Merryl Hacker   Total critical care time: 35 minutes  Critical care time was exclusive of separately billable procedures and treating other patients.  Critical care was necessary to treat or prevent imminent or life-threatening deterioration.  Critical care was time spent personally by me on the following activities: development of treatment plan with patient and/or surrogate as well as nursing, discussions with consultants, evaluation of patient's response to treatment, examination of patient, obtaining history from patient or surrogate, ordering and performing treatments and interventions, ordering and review of laboratory studies, ordering and review of radiographic studies, pulse oximetry and re-evaluation of patient's condition.   Medications Ordered in ED Medications  sodium chloride flush (NS) 0.9 % injection 3 mL (3 mLs Intravenous Given 10/04/19 0016)  sodium chloride 0.9 % bolus 1,000 mL (0  mLs Intravenous Stopped 10/04/19 0127)  acetaminophen (TYLENOL) tablet 650 mg (650 mg Oral Given 10/04/19 0016)  morphine 4 MG/ML injection 4 mg (4 mg Intravenous Given 10/04/19 0014)  ondansetron (ZOFRAN) injection 4 mg (4 mg Intravenous Given 10/04/19 0015)  iohexol (OMNIPAQUE) 300 MG/ML solution 100 mL (100 mLs Intravenous Contrast Given 10/04/19 0043)  acetaminophen (TYLENOL) tablet 650 mg (650 mg Oral Given 10/04/19 0128)  cefTRIAXone (ROCEPHIN) 1 g in sodium chloride 0.9 % 100 mL IVPB (0 g Intravenous Stopped 10/04/19 0223)  metroNIDAZOLE (FLAGYL) IVPB 500 mg (0 mg Intravenous Stopped 10/04/19 0243)  0.9 %  sodium chloride infusion (10 mL/hr Intravenous New Bag/Given 10/04/19 0329)  sodium chloride 0.9 % bolus 1,000 mL (0 mLs Intravenous Stopped 10/04/19 0243)     Initial Impression / Assessment and Plan / ED Course  I have reviewed the triage vital signs and the nursing notes.  Pertinent labs & imaging results that were available during my care of the patient were reviewed by me and considered in my medical decision making (see chart for details).  Clinical Course as of Oct 03 354  Wed Oct 04, 2019  0141 CT reviewed.  Only notable for large thyroid and bilateral adnexal cysts.  Patient persistently febrile with a significant leukocytosis.  No evidence of UTI or pneumonia.  Given abdominal pain, will obtain pelvic ultrasound to rule out torsion and tubo-ovarian abscess.  She was covered with Rocephin and Flagyl.  Flu testing was also obtained.  COVID is pending.   [CH]    Clinical Course User Index [CH] Chantea Surace, Barbette Hair, MD       Patient presents initially with abdominal pain.  However on initial evaluation noted to be febrile to 103.  Did not report fever from home.  She is nontoxic-appearing.  She is tachycardic with slight increased respiratory rate.  Lab work reviewed from triage.  She has a leukocytosis to 16.  She appears slightly dehydrated.  Patient was given fluids, pain, and  nausea medication.  She was given Tylenol for her fever.  Given left lower quadrant pain, considerations include diverticulitis and urinary tract infection given fever.  Urinalysis without evidence of UTI.  CT scan obtained and does not show any evidence of diverticulitis or other intra-abdominal pathology.  She does have a significantly enlarged uterus with a very large fibroid.  She is persistently anemic with a hemoglobin at 6.5.  Baseline greater than 8.  Given her current illness and tachycardia, will transfuse.  She required transfusion in April.  On reassessment, patient continues to endorse some abdominal pain.  I performed a pelvic exam and have covered her with Rocephin and Flagyl.  Pelvic ultrasound was obtained and does not show any evidence of tubo-ovarian abscess or ovarian torsion.  She does report some upper respiratory symptoms.  Coronavirus testing, flu testing, and strep testing are all pending.  However, given her symptomatic anemia and ongoing abnormal vitals, will admit her for transfusion and further work-up.  No evidence of sepsis or septic shock.  Lactates have been normal.  Patient has never been hypotensive.  I discussed patient with Dr. Hal Hope.  He request sending rapid coronavirus testing rather than 6-hour coronavirus testing.  I have requested nursing call to the lab.  Final Clinical Impressions(s) / ED Diagnoses   Final diagnoses:  LLQ pain  Symptomatic anemia  Fever, unspecified fever cause    ED Discharge Orders    None  Merryl Hacker, MD 10/04/19 [phone]

## 2019-10-04 ENCOUNTER — Emergency Department (HOSPITAL_COMMUNITY): Payer: Self-pay

## 2019-10-04 ENCOUNTER — Other Ambulatory Visit: Payer: Self-pay

## 2019-10-04 ENCOUNTER — Encounter (HOSPITAL_COMMUNITY): Payer: Self-pay | Admitting: Internal Medicine

## 2019-10-04 DIAGNOSIS — R6510 Systemic inflammatory response syndrome (SIRS) of non-infectious origin without acute organ dysfunction: Secondary | ICD-10-CM | POA: Diagnosis present

## 2019-10-04 DIAGNOSIS — D62 Acute posthemorrhagic anemia: Secondary | ICD-10-CM

## 2019-10-04 DIAGNOSIS — J209 Acute bronchitis, unspecified: Secondary | ICD-10-CM

## 2019-10-04 DIAGNOSIS — J45901 Unspecified asthma with (acute) exacerbation: Secondary | ICD-10-CM

## 2019-10-04 LAB — RAPID HIV SCREEN (HIV 1/2 AB+AG)
HIV 1/2 Antibodies: NONREACTIVE
HIV-1 P24 Antigen - HIV24: NONREACTIVE

## 2019-10-04 LAB — RESPIRATORY PANEL BY PCR

## 2019-10-04 LAB — PREPARE RBC (CROSSMATCH)

## 2019-10-04 LAB — CBC WITH DIFFERENTIAL/PLATELET
Abs Immature Granulocytes: 0 10*3/uL (ref 0.00–0.07)
Band Neutrophils: 0 %
Basophils Absolute: 0 10*3/uL (ref 0.0–0.1)
Basophils Relative: 0 %
Blasts: 0 %
Eosinophils Absolute: 0 10*3/uL (ref 0.0–0.5)
Eosinophils Relative: 0 %
HCT: 23.3 % — ABNORMAL LOW (ref 36.0–46.0)
Hemoglobin: 6.8 g/dL — CL (ref 12.0–15.0)
Lymphocytes Relative: 1 %
Lymphs Abs: 0.2 10*3/uL — ABNORMAL LOW (ref 0.7–4.0)
MCH: 21.3 pg — ABNORMAL LOW (ref 26.0–34.0)
MCHC: 29.2 g/dL — ABNORMAL LOW (ref 30.0–36.0)
MCV: 73 fL — ABNORMAL LOW (ref 80.0–100.0)
Metamyelocytes Relative: 0 %
Monocytes Absolute: 0.7 10*3/uL (ref 0.1–1.0)
Monocytes Relative: 3 %
Myelocytes: 0 %
Neutro Abs: 20.9 10*3/uL — ABNORMAL HIGH (ref 1.7–7.7)
Neutrophils Relative %: 96 %
Other: 0 %
Platelets: 251 10*3/uL (ref 150–400)
Promyelocytes Relative: 0 %
RBC: 3.19 MIL/uL — ABNORMAL LOW (ref 3.87–5.11)
RDW: 21.2 % — ABNORMAL HIGH (ref 11.5–15.5)
WBC: 21.8 10*3/uL — ABNORMAL HIGH (ref 4.0–10.5)
nRBC: 0 % (ref 0.0–0.2)
nRBC: 0 /100 WBC

## 2019-10-04 LAB — WET PREP, GENITAL
Clue Cells Wet Prep HPF POC: NONE SEEN
Sperm: NONE SEEN
Trich, Wet Prep: NONE SEEN
Yeast Wet Prep HPF POC: NONE SEEN

## 2019-10-04 LAB — HEPATIC FUNCTION PANEL
ALT: 8 U/L (ref 0–44)
AST: 13 U/L — ABNORMAL LOW (ref 15–41)
Albumin: 2.7 g/dL — ABNORMAL LOW (ref 3.5–5.0)
Alkaline Phosphatase: 59 U/L (ref 38–126)
Bilirubin, Direct: 0.1 mg/dL (ref 0.0–0.2)
Indirect Bilirubin: 0.3 mg/dL (ref 0.3–0.9)
Total Bilirubin: 0.4 mg/dL (ref 0.3–1.2)
Total Protein: 6.4 g/dL — ABNORMAL LOW (ref 6.5–8.1)

## 2019-10-04 LAB — GROUP A STREP BY PCR: Group A Strep by PCR: DETECTED — AB

## 2019-10-04 LAB — BASIC METABOLIC PANEL
Anion gap: 9 (ref 5–15)
BUN: 6 mg/dL (ref 6–20)
CO2: 19 mmol/L — ABNORMAL LOW (ref 22–32)
Calcium: 7.8 mg/dL — ABNORMAL LOW (ref 8.9–10.3)
Chloride: 108 mmol/L (ref 98–111)
Creatinine, Ser: 0.94 mg/dL (ref 0.44–1.00)
GFR calc Af Amer: >60 mL/min (ref >60)
GFR calc non Af Amer: >60 mL/min (ref >60)
Glucose, Bld: 137 mg/dL — ABNORMAL HIGH (ref 70–99)
Potassium: 3.3 mmol/L — ABNORMAL LOW (ref 3.5–5.1)
Sodium: 136 mmol/L (ref 135–145)

## 2019-10-04 LAB — RAPID URINE DRUG SCREEN, HOSP PERFORMED
Amphetamines: NOT DETECTED
Barbiturates: NOT DETECTED
Benzodiazepines: NOT DETECTED
Cocaine: POSITIVE — AB
Opiates: NOT DETECTED
Tetrahydrocannabinol: POSITIVE — AB

## 2019-10-04 LAB — HEPATITIS B SURFACE ANTIGEN: Hepatitis B Surface Ag: NONREACTIVE

## 2019-10-04 LAB — HEPATITIS C ANTIBODY: HCV Ab: NONREACTIVE

## 2019-10-04 LAB — SARS CORONAVIRUS 2 (TAT 6-24 HRS): SARS Coronavirus 2: NEGATIVE

## 2019-10-04 LAB — LACTIC ACID, PLASMA: Lactic Acid, Venous: 1.5 mmol/L (ref 0.5–1.9)

## 2019-10-04 LAB — INFLUENZA PANEL BY PCR (TYPE A & B)
Influenza A By PCR: NEGATIVE
Influenza B By PCR: NEGATIVE

## 2019-10-04 MED ORDER — METRONIDAZOLE IN NACL 5-0.79 MG/ML-% IV SOLN
500.0000 mg | Freq: Once | INTRAVENOUS | Status: AC
  Administered 2019-10-04: 500 mg via INTRAVENOUS
  Filled 2019-10-04: qty 100

## 2019-10-04 MED ORDER — SODIUM CHLORIDE 0.9 % IV SOLN
INTRAVENOUS | Status: DC
  Administered 2019-10-04: 08:00:00 via INTRAVENOUS

## 2019-10-04 MED ORDER — SODIUM CHLORIDE 0.9 % IV SOLN
1.0000 g | Freq: Once | INTRAVENOUS | Status: AC
  Administered 2019-10-04: 1 g via INTRAVENOUS
  Filled 2019-10-04: qty 10

## 2019-10-04 MED ORDER — ACETAMINOPHEN 500 MG PO TABS
1000.0000 mg | ORAL_TABLET | Freq: Four times a day (QID) | ORAL | Status: DC | PRN
  Administered 2019-10-04 – 2019-10-05 (×2): 1000 mg via ORAL
  Filled 2019-10-04 (×2): qty 2

## 2019-10-04 MED ORDER — METRONIDAZOLE IN NACL 5-0.79 MG/ML-% IV SOLN
500.0000 mg | Freq: Three times a day (TID) | INTRAVENOUS | Status: DC
  Administered 2019-10-04 – 2019-10-05 (×3): 500 mg via INTRAVENOUS
  Filled 2019-10-04 (×4): qty 100

## 2019-10-04 MED ORDER — IOHEXOL 300 MG/ML  SOLN
100.0000 mL | Freq: Once | INTRAMUSCULAR | Status: AC | PRN
  Administered 2019-10-04: 100 mL via INTRAVENOUS

## 2019-10-04 MED ORDER — ACETAMINOPHEN 325 MG PO TABS
650.0000 mg | ORAL_TABLET | Freq: Four times a day (QID) | ORAL | Status: DC | PRN
  Administered 2019-10-04 (×2): 650 mg via ORAL
  Filled 2019-10-04 (×3): qty 2

## 2019-10-04 MED ORDER — ACETAMINOPHEN 325 MG PO TABS
650.0000 mg | ORAL_TABLET | Freq: Once | ORAL | Status: AC
  Administered 2019-10-04: 01:00:00 650 mg via ORAL
  Filled 2019-10-04: qty 2

## 2019-10-04 MED ORDER — ACETAMINOPHEN 650 MG RE SUPP: 650.0000 mg | Freq: Four times a day (QID) | RECTAL | Status: DC | PRN

## 2019-10-04 MED ORDER — TRAMADOL HCL 50 MG PO TABS
50.0000 mg | ORAL_TABLET | Freq: Four times a day (QID) | ORAL | Status: DC | PRN
  Administered 2019-10-04 – 2019-10-05 (×3): 50 mg via ORAL
  Filled 2019-10-04 (×4): qty 1

## 2019-10-04 MED ORDER — INFLUENZA VAC SPLIT QUAD 0.5 ML IM SUSY: 0.5000 mL | PREFILLED_SYRINGE | INTRAMUSCULAR | Status: DC

## 2019-10-04 MED ORDER — SODIUM CHLORIDE 0.9 % IV SOLN
10.0000 mL/h | Freq: Once | INTRAVENOUS | Status: AC
  Administered 2019-10-04: 10 mL/h via INTRAVENOUS

## 2019-10-04 MED ORDER — SODIUM CHLORIDE 0.9 % IV BOLUS
1000.0000 mL | Freq: Once | INTRAVENOUS | Status: AC
  Administered 2019-10-04: 02:00:00 1000 mL via INTRAVENOUS

## 2019-10-04 MED ORDER — MEGESTROL ACETATE 40 MG PO TABS
40.0000 mg | ORAL_TABLET | Freq: Every day | ORAL | Status: DC
  Administered 2019-10-05 – 2019-10-07 (×3): 40 mg via ORAL
  Filled 2019-10-04 (×4): qty 1

## 2019-10-04 MED ORDER — ALBUTEROL SULFATE (2.5 MG/3ML) 0.083% IN NEBU
2.5000 mg | INHALATION_SOLUTION | Freq: Three times a day (TID) | RESPIRATORY_TRACT | Status: DC
  Administered 2019-10-05 – 2019-10-07 (×5): 2.5 mg via RESPIRATORY_TRACT
  Filled 2019-10-04 (×9): qty 3

## 2019-10-04 MED ORDER — BUDESONIDE 0.25 MG/2ML IN SUSP
0.2500 mg | Freq: Two times a day (BID) | RESPIRATORY_TRACT | Status: DC
  Administered 2019-10-04 – 2019-10-07 (×5): 0.25 mg via RESPIRATORY_TRACT
  Filled 2019-10-04 (×9): qty 2

## 2019-10-04 MED ORDER — ONDANSETRON HCL 4 MG PO TABS
4.0000 mg | ORAL_TABLET | Freq: Four times a day (QID) | ORAL | Status: DC | PRN
  Administered 2019-10-06: 4 mg via ORAL
  Filled 2019-10-04: qty 1

## 2019-10-04 MED ORDER — SODIUM CHLORIDE 0.9 % IV SOLN
2.0000 g | INTRAVENOUS | Status: DC
  Administered 2019-10-05: 2 g via INTRAVENOUS
  Filled 2019-10-04: qty 20

## 2019-10-04 MED ORDER — POTASSIUM CHLORIDE IN NACL 20-0.9 MEQ/L-% IV SOLN
INTRAVENOUS | Status: DC
  Administered 2019-10-04 – 2019-10-06 (×2): via INTRAVENOUS
  Filled 2019-10-04 (×5): qty 1000

## 2019-10-04 MED ORDER — ALBUTEROL SULFATE (2.5 MG/3ML) 0.083% IN NEBU: 2.5000 mg | INHALATION_SOLUTION | RESPIRATORY_TRACT | Status: DC | PRN

## 2019-10-04 MED ORDER — SODIUM CHLORIDE 0.9% IV SOLUTION
Freq: Once | INTRAVENOUS | Status: AC
  Administered 2019-10-04: 18:00:00 via INTRAVENOUS

## 2019-10-04 MED ORDER — PNEUMOCOCCAL VAC POLYVALENT 25 MCG/0.5ML IJ INJ
0.5000 mL | INJECTION | INTRAMUSCULAR | Status: DC
  Filled 2019-10-04: qty 0.5

## 2019-10-04 MED ORDER — ONDANSETRON HCL 4 MG/2ML IJ SOLN
4.0000 mg | Freq: Four times a day (QID) | INTRAMUSCULAR | Status: DC | PRN
  Administered 2019-10-04 – 2019-10-06 (×4): 4 mg via INTRAVENOUS
  Filled 2019-10-04 (×4): qty 2

## 2019-10-04 MED ORDER — ALBUTEROL SULFATE (2.5 MG/3ML) 0.083% IN NEBU
2.5000 mg | INHALATION_SOLUTION | RESPIRATORY_TRACT | Status: DC
  Administered 2019-10-04 (×4): 2.5 mg via RESPIRATORY_TRACT
  Filled 2019-10-04 (×4): qty 3

## 2019-10-04 NOTE — Consult Note (Addendum)
OB/GYN Consult Note  Deborah Melendez Deborah Melendez is a 43 y.o. G6P4 presenting for abdominal pain. Sudden onset last night of left lower quadrant pain rates it 8/10. Cannot qualify it beyond "pain." Has not been able to eat since yesterday. Worse with movement, using the rest room. Some pain on right side but much worse on left. Has never had pain like this before.  Patient denies fever/chills, nausea at home. When asked about bathroom habits, just states that it hurts to go. She is having some vaginal bleeding, reports it is minimal today and has been going on for a while.   Patient states she has never been told she has fibroids. Has not seen a gynecologist in many years, is not sure if she has ever had a pap smear. States she has irregular periods, have been irregular for years. States they will come on for "weeks" at a time then go off, not predictable at all. Has not ever seen Gyn for this issue, has never been on hormonal management. H/o vaginal deliveries.        Past Medical History:  Diagnosis Date  . Asthma   . Bipolar 1 disorder, depressed, severe ([place]) 02/04/2017  . Cocaine abuse with cocaine-induced mood disorder ([place]) 07/27/2017  . COPD (chronic obstructive pulmonary disease) ([place])   . Depression   . Homicidal ideations   . Manic behavior ([place])   . MDD (major depressive disorder), recurrent severe, without psychosis ([place]) 08/08/2015  . Substance or medication-induced bipolar and related disorder with onset during intoxication ([place]) 09/08/2016  . Suicidal ideation     Past Surgical History:  Procedure Laterality Date  . FINGER SURGERY      OB History  Gravida Para Term Preterm AB Living  6 4       4   SAB TAB Ectopic Multiple Live Births          0    # Outcome Date GA Lbr Len/2nd Weight Sex Delivery Anes PTL Lv  6 Gravida           5 Para           4 Para           3 Para           2 Para           1 Gravida             Social  History   Socioeconomic History  . Marital status: Single    Spouse name: Not on file  . Number of children: Not on file  . Years of education: Not on file  . Highest education level: Not on file  Occupational History  . Not on file  Social Needs  . Financial resource strain: Not on file  . Food insecurity    Worry: Not on file    Inability: Not on file  . Transportation needs    Medical: Not on file    Non-medical: Not on file  Tobacco Use  . Smoking status: Current Every Day Smoker    Packs/day: 1.00    Types: Cigarettes  . Smokeless tobacco: Never Used  Substance and Sexual Activity  . Alcohol use: Yes    Comment: pt denies drinking at this time  . Drug use: Yes    Frequency: 1.0 times per week    Types: Cocaine, Marijuana    Comment:  last used cocaine today  . Sexual activity: Yes  Birth control/protection: None  Lifestyle  . Physical activity    Days per week: Not on file    Minutes per session: Not on file  . Stress: Not on file  Relationships  . Social Herbalist on phone: Not on file    Gets together: Not on file    Attends religious service: Not on file    Active member of club or organization: Not on file    Attends meetings of clubs or organizations: Not on file    Relationship status: Not on file  Other Topics Concern  . Not on file  Social History Narrative  . Not on file    Family History  Problem Relation Age of Onset  . Diabetes Mother   . Hypertension Mother   . Drug abuse Father   . Schizophrenia Maternal Aunt     (Not in a hospital admission)   No Known Allergies  Review of Systems: Negative except for what is mentioned in HPI.     Physical Exam: BP (!) 121/93   Pulse (!) 101   Temp 99.7 F (37.6 C) (Oral)   Resp 20   SpO2 100%  CONSTITUTIONAL: Well-developed female in mild distress, intermittently asking for pain meds but able to answer questions and cooperative with interview and most of exam  HENT:   Normocephalic, atraumatic,  EYES: Conjunctivae and EOM are normal. Pupils are equal, round, and reactive to light.  NECK: Normal range of motion SKIN: Skin is dry. Multiple lesions in various states of healing noted. Not diaphoretic. No erythema. No pallor. Patient grossly febrile to touch NEUROLGIC: Alert and oriented to person, place, and time. Normal reflexes, muscle tone coordination. No cranial nerve deficit noted. PSYCHIATRIC: Normal mood and affect. Normal behavior. Normal judgment and thought content. CARDIOVASCULAR: mildly tachycardic RESPIRATORY: slightly increased work of breathing, appears mildly labored ABDOMEN: Soft, mildly tender in right lower quadrant, moderately tender in LLQ, nondistended, no guarding. Presumed fibroid palpable at right umbilicus PELVIC: normal appearing external female genitalia, small amount of watery appearing blood in vaginal vault, unable to visualize cervix well as patient requested I halt exam with speculum, noted to have collapsing vaginal walls BIMANUAL: no cervical motion tenderness, cervix palpates normally and without pain, no uterine tenderness, mild right lower quadrant tenderness but no notable right adnexal tenderness, some left adnexal tenderness, patient unable to tolerate complete exam MUSCULOSKELETAL: Normal range of motion. No edema and no tenderness. 2+ distal pulses.  Pelvic exam done with RN chaperone present.  Pertinent Labs/Studies:   Results for orders placed or performed during the hospital encounter of 10/03/19 (from the past 72 hour(s))  Lactic acid, plasma     Status: None   Collection Time: 10/03/19 10:19 PM  Result Value Ref Range   Lactic Acid, Venous 1.3 0.5 - 1.9 mmol/L    Comment: Performed at [facility], [address] [place], [place] [address]  Comprehensive metabolic panel     Status: Abnormal   Collection Time: 10/03/19 10:19 PM  Result Value Ref Range   Sodium 133 (L) 135 - 145 mmol/L   Potassium 3.4 (L)  3.5 - 5.1 mmol/L   Chloride 102 98 - 111 mmol/L   CO2 20 (L) 22 - 32 mmol/L   Glucose, Bld 121 (H) 70 - 99 mg/dL   BUN 6 6 - 20 mg/dL   Creatinine, Ser 1.03 (H) 0.44 - 1.00 mg/dL   Calcium 8.7 (L) 8.9 - 10.3 mg/dL  Total Protein 7.3 6.5 - 8.1 g/dL   Albumin 3.3 (L) 3.5 - 5.0 g/dL   AST 15 15 - 41 U/L   ALT 8 0 - 44 U/L   Alkaline Phosphatase 65 38 - 126 U/L   Total Bilirubin 0.6 0.3 - 1.2 mg/dL   GFR calc non Af Amer >60 >60 mL/min   GFR calc Af Amer >60 >60 mL/min   Anion gap 11 5 - 15    Comment: Performed at [facility] [address] [place], [place] [address]  CBC with Differential     Status: Abnormal   Collection Time: 10/03/19 10:19 PM  Result Value Ref Range   WBC 16.0 (H) 4.0 - 10.5 K/uL   RBC 3.34 (L) 3.87 - 5.11 MIL/uL   Hemoglobin 6.5 (LL) 12.0 - 15.0 g/dL    Comment: REPEATED TO VERIFY Reticulocyte Hemoglobin testing may be clinically indicated, consider ordering this additional test [id number] THIS CRITICAL RESULT HAS VERIFIED AND BEEN CALLED TO RN JESSICA,F. BY MESSAN HOUEGNIFIO ON 10 06 2020 AT 2304, AND HAS BEEN READ BACK.     HCT 23.5 (L) 36.0 - 46.0 %   MCV 70.4 (L) 80.0 - 100.0 fL   MCH 19.5 (L) 26.0 - 34.0 pg   MCHC 27.7 (L) 30.0 - 36.0 g/dL   RDW 21.0 (H) 11.5 - 15.5 %   Platelets 366 150 - 400 K/uL   nRBC 0.0 0.0 - 0.2 %   Neutrophils Relative % 85 %   Neutro Abs 13.6 (H) 1.7 - 7.7 K/uL   Lymphocytes Relative 8 %   Lymphs Abs 1.2 0.7 - 4.0 K/uL   Monocytes Relative 6 %   Monocytes Absolute 1.0 0.1 - 1.0 K/uL   Eosinophils Relative 0 %   Eosinophils Absolute 0.0 0.0 - 0.5 K/uL   Basophils Relative 0 %   Basophils Absolute 0.0 0.0 - 0.1 K/uL   Immature Granulocytes 1 %   Abs Immature Granulocytes 0.11 (H) 0.00 - 0.07 K/uL    Comment: Performed at [facility] [address] [place], [place] [address]  I-Stat beta hCG blood, ED     Status: None   Collection Time: 10/03/19 10:31 PM  Result Value Ref Range   I-stat hCG,  quantitative <5.0 <5 mIU/mL   Comment 3            Comment:   GEST. AGE      CONC.  (mIU/mL)   <=1 WEEK        5 - 50     2 WEEKS       50 - 500     3 WEEKS       100 - 10,000     4 WEEKS     1,000 - 30,000        FEMALE AND NON-PREGNANT FEMALE:     LESS THAN 5 mIU/mL   Urinalysis, Routine w reflex microscopic     Status: Abnormal   Collection Time: 10/03/19 10:33 PM  Result Value Ref Range   Color, Urine YELLOW YELLOW   APPearance CLEAR CLEAR   Specific Gravity, Urine 1.013 1.005 - 1.030   pH 8.0 5.0 - 8.0   Glucose, UA NEGATIVE NEGATIVE mg/dL   Hgb urine dipstick SMALL (A) NEGATIVE   Bilirubin Urine NEGATIVE NEGATIVE   Ketones, ur 5 (A) NEGATIVE mg/dL   Protein, ur NEGATIVE NEGATIVE mg/dL   Nitrite NEGATIVE NEGATIVE   Leukocytes,Ua NEGATIVE NEGATIVE   RBC /  HPF 0-5 0 - 5 RBC/hpf   WBC, UA 0-5 0 - 5 WBC/hpf   Bacteria, UA RARE (A) NONE SEEN   Squamous Epithelial / LPF 0-5 0 - 5   Mucus PRESENT     Comment: Performed at [facility], [place] [address] [place], [place] [address]  Rapid urine drug screen (hospital performed)     Status: Abnormal   Collection Time: 10/03/19 10:33 PM  Result Value Ref Range   Opiates NONE DETECTED NONE DETECTED   Cocaine POSITIVE (A) NONE DETECTED   Benzodiazepines NONE DETECTED NONE DETECTED   Amphetamines NONE DETECTED NONE DETECTED   Tetrahydrocannabinol POSITIVE (A) NONE DETECTED   Barbiturates NONE DETECTED NONE DETECTED    Comment: (NOTE) DRUG SCREEN FOR MEDICAL PURPOSES ONLY.  IF CONFIRMATION IS NEEDED FOR ANY PURPOSE, NOTIFY LAB WITHIN 5 DAYS. LOWEST DETECTABLE LIMITS FOR URINE DRUG SCREEN Drug Class                     Cutoff (ng/mL) Amphetamine and metabolites    1000 Barbiturate and metabolites    200 Benzodiazepine                 [id number] Tricyclics and metabolites     300 Opiates and metabolites        300 Cocaine and metabolites        300 THC                            50 Performed at [facility], [place]  [address] [place], [place] [address]   Lactic acid, plasma     Status: None   Collection Time: 10/04/19 12:08 AM  Result Value Ref Range   Lactic Acid, Venous 1.5 0.5 - 1.9 mmol/L    Comment: Performed at [facility] [address] [place], [place] [address]  Type and screen [place]     Status: None (Preliminary result)   Collection Time: 10/04/19 12:22 AM  Result Value Ref Range   ABO/RH(D) O POS    Antibody Screen NEG    Sample Expiration 10/07/2019,2359    Unit Number [id number]    Blood Component Type RBC LR PHER1    Unit division 00    Status of Unit ISSUED    Transfusion Status OK TO TRANSFUSE    Crossmatch Result      Compatible Performed at [facility], [place] [address], [place], [place] [address]   SARS CORONAVIRUS 2 (TAT 6-24 HRS) Nasopharyngeal Nasopharyngeal Swab     Status: None   Collection Time: 10/04/19  1:02 AM   Specimen: Nasopharyngeal Swab  Result Value Ref Range   SARS Coronavirus 2 NEGATIVE NEGATIVE    Comment: (NOTE) SARS-CoV-2 target nucleic acids are NOT DETECTED. The SARS-CoV-2 RNA is generally detectable in upper and lower respiratory specimens during the acute phase of infection. Negative results do not preclude SARS-CoV-2 infection, do not rule out co-infections with other pathogens, and should not be used as the sole basis for treatment or other patient management decisions. Negative results must be combined with clinical observations, patient history, and epidemiological information. The expected result is Negative. Fact Sheet for Patients: SugarRoll.be Fact Sheet for Healthcare Providers: https://www.woods-mathews.com/ This test is not yet approved or cleared by the [place] FDA and  has been authorized for detection and/or diagnosis of SARS-CoV-2 by FDA under an Emergency Use Authorization (  EUA). This EUA will remain  in effect (meaning this test can be used) for  the duration of the COVID-19 declaration under Section 56 4(b)(1) of the Act, 21 U.S.C. section 360bbb-3(b)(1), unless the authorization is terminated or revoked sooner. Performed at [facility], [place] [address], [place], [place] [address]   Influenza panel by PCR (type A & B)     Status: None   Collection Time: 10/04/19  1:26 AM  Result Value Ref Range   Influenza A By PCR NEGATIVE NEGATIVE   Influenza B By PCR NEGATIVE NEGATIVE    Comment: (NOTE) The Xpert Xpress Flu assay is intended as an aid in the diagnosis of  influenza and should not be used as a sole basis for treatment.  This  assay is FDA approved for nasopharyngeal swab specimens only. Nasal  washings and aspirates are unacceptable for Xpert Xpress Flu testing. Performed at [facility], [place] [address], [place], [place] [address]   Prepare RBC     Status: None   Collection Time: 10/04/19  2:00 AM  Result Value Ref Range   Order Confirmation      ORDER PROCESSED BY BLOOD BANK Performed at [facility], [place] [address] [place], [place] [address]   Group A Strep by PCR     Status: Abnormal   Collection Time: 10/04/19  3:03 AM   Specimen: Sterile Swab  Result Value Ref Range   Group A Strep by PCR DETECTED (A) NOT DETECTED    Comment: Performed at [facility] [address], [place], [place] [address]  Wet prep, genital     Status: Abnormal   Collection Time: 10/04/19  3:19 AM  Result Value Ref Range   Yeast Wet Prep HPF POC NONE SEEN NONE SEEN   Trich, Wet Prep NONE SEEN NONE SEEN   Clue Cells Wet Prep HPF POC NONE SEEN NONE SEEN   WBC, Wet Prep HPF POC MANY (A) NONE SEEN   Sperm NONE SEEN     Comment: Performed at [facility], [place] [address], [place], [place] [id number]  Basic metabolic panel     Status: Abnormal   Collection Time: 10/04/19  8:19 AM  Result Value Ref Range   Sodium 136 135 - 145 mmol/L   Potassium 3.3 (L) 3.5 - 5.1 mmol/L   Chloride 108 98 - 111 mmol/L   CO2  19 (L) 22 - 32 mmol/L   Glucose, Bld 137 (H) 70 - 99 mg/dL   BUN 6 6 - 20 mg/dL   Creatinine, Ser 0.94 0.44 - 1.00 mg/dL   Calcium 7.8 (L) 8.9 - 10.3 mg/dL   GFR calc non Af Amer >60 >60 mL/min   GFR calc Af Amer >60 >60 mL/min   Anion gap 9 5 - 15    Comment: Performed at [facility], [place] [address] [place], [place] [address]  Hepatic function panel     Status: Abnormal   Collection Time: 10/04/19  8:19 AM  Result Value Ref Range   Total Protein 6.4 (L) 6.5 - 8.1 g/dL   Albumin 2.7 (L) 3.5 - 5.0 g/dL   AST 13 (L) 15 - 41 U/L   ALT 8 0 - 44 U/L   Alkaline Phosphatase 59 38 - 126 U/L   Total Bilirubin 0.4 0.3 - 1.2 mg/dL   Bilirubin, Direct 0.1 0.0 - 0.2 mg/dL   Indirect Bilirubin 0.3 0.3 - 0.9 mg/dL  Comment: Performed at [facility], [place] [address] [place], [place] [address]  CBC WITH DIFFERENTIAL     Status: Abnormal   Collection Time: 10/04/19  8:19 AM  Result Value Ref Range   WBC 21.8 (H) 4.0 - 10.5 K/uL   RBC 3.19 (L) 3.87 - 5.11 MIL/uL   Hemoglobin 6.8 (LL) 12.0 - 15.0 g/dL    Comment: REPEATED TO VERIFY Reticulocyte Hemoglobin testing may be clinically indicated, consider ordering this additional test [id number] CRITICAL VALUE NOTED.  VALUE IS CONSISTENT WITH PREVIOUSLY REPORTED AND CALLED VALUE.    HCT 23.3 (L) 36.0 - 46.0 %   MCV 73.0 (L) 80.0 - 100.0 fL   MCH 21.3 (L) 26.0 - 34.0 pg   MCHC 29.2 (L) 30.0 - 36.0 g/dL   RDW 21.2 (H) 11.5 - 15.5 %   Platelets 251 150 - 400 K/uL   nRBC 0.0 0.0 - 0.2 %   Neutrophils Relative % 96 %   Lymphocytes Relative 1 %   Monocytes Relative 3 %   Eosinophils Relative 0 %   Basophils Relative 0 %   Band Neutrophils 0 %   Metamyelocytes Relative 0 %   Myelocytes 0 %   Promyelocytes Relative 0 %   Blasts 0 %   nRBC 0 0 /100 WBC   Other 0 %   Neutro Abs 20.9 (H) 1.7 - 7.7 K/uL   Lymphs Abs 0.2 (L) 0.7 - 4.0 K/uL   Monocytes Absolute 0.7 0.1 - 1.0 K/uL   Eosinophils Absolute 0.0 0.0 - 0.5 K/uL   Basophils  Absolute 0.0 0.0 - 0.1 K/uL   Abs Immature Granulocytes 0.00 0.00 - 0.07 K/uL   Polychromasia PRESENT     Comment: Performed at [facility], [place] [address], [place], [place] [address]    IMPRESSION: 1. Markedly enlarged uterus with dominant solid mass occupying the central and fundal portion of the uterus, measuring up to 13.5 cm, presumably a large fibroid. Endometrium was obscured by the large uterine mass. 2. Negative for ovarian torsion 3. Bilateral renal cysts measuring up to 4.4 cm. Assuming premenopausal patient, this is almost certainly benign, and no specific imaging follow up is recommended according to the Society of Radiologists in [place] (D Clovis Riley et al. Management of Asymptomatic Ovarian and Other Adnexal Cysts Imaged at [place]: Society of Radiologists in [place] Statement 2010. Radiology 256 (Sept 2010): 943-954.).     Assessment and Plan :Deborah Melendez is a 43 y.o. G6P4 under observation for fever of unknown origin. Has been started on empiric antibiotics for same. Noted to have 14 cm fibroid on ultrasound, and patient started on megace 40 mg daily to control bleeding, she is having minimal bleeding now. She does complain of significant LLQ pain and is tender on exam. Typically, do not see fibroid related issues that have an acute onset of pain. Additionally, on exam, she is not tender over fibroid. Ovarian torsion is a consideration given sudden onset of pain, none noted on ultrasound, but this cannot be completely ruled out based on imaging. However, torsion picutre is not consistent with her fever. PID less likely given sudden onset and localized source of pain. Would recommend continuing empiric antibiotics for now and observation to see if pain improves/worsens.  Continue megace 40 mg daily She will need outpatient follow up for pap and EMB   Thank you for this consult, we will follow along,  please call or re-consult with further  questions.  Total face-to-face time with patient: 60 minutes. Over 50% of encounter was spent on counseling and coordination of care.   For OB/GYN questions, please call the [facility] [place] Monday - Friday, 8 am - 5 pm: [phone] All other times: [phone]    K. Arvilla Meres, M.D. Attending [place], [facility] for [employer], [place]

## 2019-10-04 NOTE — Progress Notes (Signed)
CSW met with patient at bedside to discuss her current substance use, patient's UDS was positive for marijuana and cocaine upon her arrival to [facility] ED. CSW attempted to initiate conversation with patient, however patient was repeatedly stating she was in pain for her back and that if I wasn't there to help her with her pain she did not want to discuss it. Whenever CSW inquired with patient about her cocaine and marijuana use - she stated she doesn't use cocaine. CSW offered resources to patient and she denied them.  Madilyn Fireman, MSW, LCSW-A Clinical Social Worker   [facility]   Medical ICU [phone]

## 2019-10-04 NOTE — Progress Notes (Signed)
Patient arrived to unit via stretcher. Patient alert or oriented. Patient oriented to the room and care/welcome packet reviews to included patient bill of rights. Call bell and belongings within reach. Patient skin with multiple healed lesions generally over her body. She reports she picks at her skin. Bed alarm activated for safety until fall score is evaluated.

## 2019-10-04 NOTE — Progress Notes (Signed)
Patient admitted after midnight but care began prior to midnight.  Here with abdominal pain of unclear etiology as well as anemia.   CT scan unrevealing except for large fibroid, U/A negative.  Strep A screen done (not sure why) and was positive.  On abx- rocephin and flagyl-- blood cultures ordered but not yet done.     Flu/COVID negative- NP swab pending.  +cocaine and THC.  Will continue IVF, breathing treatments, IV abx and labs as no clear etiology of fever found yet.  Getting 2nd unit of PRBC currently.  H/H after.  GYn to see per H&P.  Started on megace.  Eulogio Bear DO

## 2019-10-04 NOTE — ED Notes (Signed)
ED TO INPATIENT HANDOFF REPORT  ED Nurse Name and Phone #: [phone]  S Name/Age/Gender Deborah Melendez 43 y.o. female Room/Bed: 056C/056C  Code Status   Code Status: Full Code  Home/SNF/Other Home Patient oriented to: self Is this baseline? Yes   Triage Complete: Triage complete  Chief Complaint side pain emesis  Triage Note Pt reports lower left abdominal pain, fever, sob, weakness X2 days.     Allergies No Known Allergies  Level of Care/Admitting Diagnosis ED Disposition    ED Disposition Condition Comment   Admit  Hospital Area: [facility] [[id number]]  Level of Care: Telemetry Medical [[id number]]  Covid Evaluation: Confirmed COVID Negative  Diagnosis: Symptomatic anemia [id number]  Admitting Physician: Maida Sale  Attending Physician: Geradine Girt [[id number]]  Estimated length of stay: past midnight tomorrow  Certification:: I certify this patient will need inpatient services for at least 2 midnights  PT Class (Do Not Modify): Inpatient [101]  PT Acc Code (Do Not Modify): Private [1]       B Medical/Surgery History Past Medical History:  Diagnosis Date  . Asthma   . Bipolar 1 disorder, depressed, severe ([place]) 02/04/2017  . Cocaine abuse with cocaine-induced mood disorder ([place]) 07/27/2017  . COPD (chronic obstructive pulmonary disease) ([place])   . Depression   . Homicidal ideations   . Manic behavior ([place])   . MDD (major depressive disorder), recurrent severe, without psychosis ([place]) 08/08/2015  . Substance or medication-induced bipolar and related disorder with onset during intoxication ([place]) 09/08/2016  . Suicidal ideation    Past Surgical History:  Procedure Laterality Date  . FINGER SURGERY       A IV Location/Drains/Wounds Patient Lines/Drains/Airways Status   Active Line/Drains/Airways    Name:   Placement date:   Placement time:   Site:   Days:   Peripheral IV 06/04/19 Left Antecubital   06/04/19    1000     Antecubital   122   Peripheral IV 10/04/19 Left Antecubital   10/04/19    0013    Antecubital   less than 1   Peripheral IV 10/04/19 Right Hand   10/04/19    0159    Hand   less than 1          Intake/Output Last 24 hours  Intake/Output Summary (Last 24 hours) at 10/04/2019 1451 Last data filed at 10/04/2019 [id number] Gross per 24 hour  Intake 685 ml  Output -  Net 685 ml    Labs/Imaging Results for orders placed or performed during the hospital encounter of 10/03/19 (from the past 48 hour(s))  Lactic acid, plasma     Status: None   Collection Time: 10/03/19 10:19 PM  Result Value Ref Range   Lactic Acid, Venous 1.3 0.5 - 1.9 mmol/L    Comment: Performed at [facility], [address], [place], [place] [address]  Comprehensive metabolic panel     Status: Abnormal   Collection Time: 10/03/19 10:19 PM  Result Value Ref Range   Sodium 133 (L) 135 - 145 mmol/L   Potassium 3.4 (L) 3.5 - 5.1 mmol/L   Chloride 102 98 - 111 mmol/L   CO2 20 (L) 22 - 32 mmol/L   Glucose, Bld 121 (H) 70 - 99 mg/dL   BUN 6 6 - 20 mg/dL   Creatinine, Ser 1.03 (H) 0.44 - 1.00 mg/dL   Calcium 8.7 (L) 8.9 - 10.3 mg/dL   Total Protein 7.3 6.5 - 8.1 g/dL  Albumin 3.3 (L) 3.5 - 5.0 g/dL   AST 15 15 - 41 U/L   ALT 8 0 - 44 U/L   Alkaline Phosphatase 65 38 - 126 U/L   Total Bilirubin 0.6 0.3 - 1.2 mg/dL   GFR calc non Af Amer >60 >60 mL/min   GFR calc Af Amer >60 >60 mL/min   Anion gap 11 5 - 15    Comment: Performed at [facility] [address], [place], [place] [address]  CBC with Differential     Status: Abnormal   Collection Time: 10/03/19 10:19 PM  Result Value Ref Range   WBC 16.0 (H) 4.0 - 10.5 K/uL   RBC 3.34 (L) 3.87 - 5.11 MIL/uL   Hemoglobin 6.5 (LL) 12.0 - 15.0 g/dL    Comment: REPEATED TO VERIFY Reticulocyte Hemoglobin testing may be clinically indicated, consider ordering this additional test [id number] THIS CRITICAL RESULT HAS VERIFIED AND BEEN CALLED TO RN JESSICA,F. BY  MESSAN HOUEGNIFIO ON 10 06 2020 AT 2304, AND HAS BEEN READ BACK.     HCT 23.5 (L) 36.0 - 46.0 %   MCV 70.4 (L) 80.0 - 100.0 fL   MCH 19.5 (L) 26.0 - 34.0 pg   MCHC 27.7 (L) 30.0 - 36.0 g/dL   RDW 21.0 (H) 11.5 - 15.5 %   Platelets 366 150 - 400 K/uL   nRBC 0.0 0.0 - 0.2 %   Neutrophils Relative % 85 %   Neutro Abs 13.6 (H) 1.7 - 7.7 K/uL   Lymphocytes Relative 8 %   Lymphs Abs 1.2 0.7 - 4.0 K/uL   Monocytes Relative 6 %   Monocytes Absolute 1.0 0.1 - 1.0 K/uL   Eosinophils Relative 0 %   Eosinophils Absolute 0.0 0.0 - 0.5 K/uL   Basophils Relative 0 %   Basophils Absolute 0.0 0.0 - 0.1 K/uL   Immature Granulocytes 1 %   Abs Immature Granulocytes 0.11 (H) 0.00 - 0.07 K/uL    Comment: Performed at [facility] [address] [place], [place] [address]  I-Stat beta hCG blood, ED     Status: None   Collection Time: 10/03/19 10:31 PM  Result Value Ref Range   I-stat hCG, quantitative <5.0 <5 mIU/mL   Comment 3            Comment:   GEST. AGE      CONC.  (mIU/mL)   <=1 WEEK        5 - 50     2 WEEKS       50 - 500     3 WEEKS       100 - 10,000     4 WEEKS     1,000 - 30,000        FEMALE AND NON-PREGNANT FEMALE:     LESS THAN 5 mIU/mL   Urinalysis, Routine w reflex microscopic     Status: Abnormal   Collection Time: 10/03/19 10:33 PM  Result Value Ref Range   Color, Urine YELLOW YELLOW   APPearance CLEAR CLEAR   Specific Gravity, Urine 1.013 1.005 - 1.030   pH 8.0 5.0 - 8.0   Glucose, UA NEGATIVE NEGATIVE mg/dL   Hgb urine dipstick SMALL (A) NEGATIVE   Bilirubin Urine NEGATIVE NEGATIVE   Ketones, ur 5 (A) NEGATIVE mg/dL   Protein, ur NEGATIVE NEGATIVE mg/dL   Nitrite NEGATIVE NEGATIVE   Leukocytes,Ua NEGATIVE NEGATIVE   RBC / HPF 0-5 0 - 5 RBC/hpf  WBC, UA 0-5 0 - 5 WBC/hpf   Bacteria, UA RARE (A) NONE SEEN   Squamous Epithelial / LPF 0-5 0 - 5   Mucus PRESENT     Comment: Performed at [facility], [place] [address] [place], [place] [address]  Rapid  urine drug screen (hospital performed)     Status: Abnormal   Collection Time: 10/03/19 10:33 PM  Result Value Ref Range   Opiates NONE DETECTED NONE DETECTED   Cocaine POSITIVE (A) NONE DETECTED   Benzodiazepines NONE DETECTED NONE DETECTED   Amphetamines NONE DETECTED NONE DETECTED   Tetrahydrocannabinol POSITIVE (A) NONE DETECTED   Barbiturates NONE DETECTED NONE DETECTED    Comment: (NOTE) DRUG SCREEN FOR MEDICAL PURPOSES ONLY.  IF CONFIRMATION IS NEEDED FOR ANY PURPOSE, NOTIFY LAB WITHIN 5 DAYS. LOWEST DETECTABLE LIMITS FOR URINE DRUG SCREEN Drug Class                     Cutoff (ng/mL) Amphetamine and metabolites    1000 Barbiturate and metabolites    200 Benzodiazepine                 [id number] Tricyclics and metabolites     300 Opiates and metabolites        300 Cocaine and metabolites        300 THC                            50 Performed at [facility], [place] [address] [place], [place] [address]   Lactic acid, plasma     Status: None   Collection Time: 10/04/19 12:08 AM  Result Value Ref Range   Lactic Acid, Venous 1.5 0.5 - 1.9 mmol/L    Comment: Performed at [facility] [address], [place], [place] [address]  Type and screen [place]     Status: None (Preliminary result)   Collection Time: 10/04/19 12:22 AM  Result Value Ref Range   ABO/RH(D) O POS    Antibody Screen NEG    Sample Expiration      10/07/2019,2359 Performed at [facility], [place] [address] [place], [place] [address]    Unit Number [id number]    Blood Component Type RBC LR PHER1    Unit division 00    Status of Unit ISSUED    Transfusion Status OK TO TRANSFUSE    Crossmatch Result Compatible    Unit Number [id number]    Blood Component Type RED CELLS,LR    Unit division 00    Status of Unit ALLOCATED    Transfusion Status OK TO TRANSFUSE    Crossmatch Result Compatible   SARS CORONAVIRUS 2 (TAT 6-24 HRS) Nasopharyngeal Nasopharyngeal Swab      Status: None   Collection Time: 10/04/19  1:02 AM   Specimen: Nasopharyngeal Swab  Result Value Ref Range   SARS Coronavirus 2 NEGATIVE NEGATIVE    Comment: (NOTE) SARS-CoV-2 target nucleic acids are NOT DETECTED. The SARS-CoV-2 RNA is generally detectable in upper and lower respiratory specimens during the acute phase of infection. Negative results do not preclude SARS-CoV-2 infection, do not rule out co-infections with other pathogens, and should not be used as the sole basis for treatment or other patient management decisions. Negative results must be combined with clinical observations, patient history, and epidemiological information. The expected result is Negative. Fact Sheet for Patients: SugarRoll.be Fact Sheet for  Healthcare Providers: https://www.woods-mathews.com/ This test is not yet approved or cleared by the [place] and  has been authorized for detection and/or diagnosis of SARS-CoV-2 by FDA under an Emergency Use Authorization (EUA). This EUA will remain  in effect (meaning this test can be used) for the duration of the COVID-19 declaration under Section 56 4(b)(1) of the Act, 21 U.S.C. section 360bbb-3(b)(1), unless the authorization is terminated or revoked sooner. Performed at [facility], [place] [address] [place], [place] [address]   Influenza panel by PCR (type A & B)     Status: None   Collection Time: 10/04/19  1:26 AM  Result Value Ref Range   Influenza A By PCR NEGATIVE NEGATIVE   Influenza B By PCR NEGATIVE NEGATIVE    Comment: (NOTE) The Xpert Xpress Flu assay is intended as an aid in the diagnosis of  influenza and should not be used as a sole basis for treatment.  This  assay is FDA approved for nasopharyngeal swab specimens only. Nasal  washings and aspirates are unacceptable for Xpert Xpress Flu testing. Performed at [facility], [place] [address], [place], [place] [address]   Prepare  RBC     Status: None   Collection Time: 10/04/19  2:00 AM  Result Value Ref Range   Order Confirmation      ORDER PROCESSED BY BLOOD BANK Performed at [facility], [place] [address], [place], [place] [address]   Group A Strep by PCR     Status: Abnormal   Collection Time: 10/04/19  3:03 AM   Specimen: Sterile Swab  Result Value Ref Range   Group A Strep by PCR DETECTED (A) NOT DETECTED    Comment: Performed at [facility] [address] [place], [place] [address]  Wet prep, genital     Status: Abnormal   Collection Time: 10/04/19  3:19 AM  Result Value Ref Range   Yeast Wet Prep HPF POC NONE SEEN NONE SEEN   Trich, Wet Prep NONE SEEN NONE SEEN   Clue Cells Wet Prep HPF POC NONE SEEN NONE SEEN   WBC, Wet Prep HPF POC MANY (A) NONE SEEN   Sperm NONE SEEN     Comment: Performed at [name], [place] [address], [place], [place] [id number]  Basic metabolic panel     Status: Abnormal   Collection Time: 10/04/19  8:19 AM  Result Value Ref Range   Sodium 136 135 - 145 mmol/L   Potassium 3.3 (L) 3.5 - 5.1 mmol/L   Chloride 108 98 - 111 mmol/L   CO2 19 (L) 22 - 32 mmol/L   Glucose, Bld 137 (H) 70 - 99 mg/dL   BUN 6 6 - 20 mg/dL   Creatinine, Ser 0.94 0.44 - 1.00 mg/dL   Calcium 7.8 (L) 8.9 - 10.3 mg/dL   GFR calc non Af Amer >60 >60 mL/min   GFR calc Af Amer >60 >60 mL/min   Anion gap 9 5 - 15    Comment: Performed at [facility], [place] [address] [place], [place] [address]  Hepatic function panel     Status: Abnormal   Collection Time: 10/04/19  8:19 AM  Result Value Ref Range   Total Protein 6.4 (L) 6.5 - 8.1 g/dL   Albumin 2.7 (L) 3.5 - 5.0 g/dL   AST 13 (L) 15 - 41 U/L   ALT 8 0 - 44 U/L   Alkaline Phosphatase  59 38 - 126 U/L   Total Bilirubin 0.4 0.3 - 1.2 mg/dL   Bilirubin, Direct 0.1 0.0 - 0.2 mg/dL   Indirect Bilirubin 0.3 0.3 - 0.9 mg/dL    Comment: Performed at [facility] [address] [place], [place] [address]  CBC WITH  DIFFERENTIAL     Status: Abnormal   Collection Time: 10/04/19  8:19 AM  Result Value Ref Range   WBC 21.8 (H) 4.0 - 10.5 K/uL   RBC 3.19 (L) 3.87 - 5.11 MIL/uL   Hemoglobin 6.8 (LL) 12.0 - 15.0 g/dL    Comment: REPEATED TO VERIFY Reticulocyte Hemoglobin testing may be clinically indicated, consider ordering this additional test [id number] CRITICAL VALUE NOTED.  VALUE IS CONSISTENT WITH PREVIOUSLY REPORTED AND CALLED VALUE.    HCT 23.3 (L) 36.0 - 46.0 %   MCV 73.0 (L) 80.0 - 100.0 fL   MCH 21.3 (L) 26.0 - 34.0 pg   MCHC 29.2 (L) 30.0 - 36.0 g/dL   RDW 21.2 (H) 11.5 - 15.5 %   Platelets 251 150 - 400 K/uL   nRBC 0.0 0.0 - 0.2 %   Neutrophils Relative % 96 %   Lymphocytes Relative 1 %   Monocytes Relative 3 %   Eosinophils Relative 0 %   Basophils Relative 0 %   Band Neutrophils 0 %   Metamyelocytes Relative 0 %   Myelocytes 0 %   Promyelocytes Relative 0 %   Blasts 0 %   nRBC 0 0 /100 WBC   Other 0 %   Neutro Abs 20.9 (H) 1.7 - 7.7 K/uL   Lymphs Abs 0.2 (L) 0.7 - 4.0 K/uL   Monocytes Absolute 0.7 0.1 - 1.0 K/uL   Eosinophils Absolute 0.0 0.0 - 0.5 K/uL   Basophils Absolute 0.0 0.0 - 0.1 K/uL   Abs Immature Granulocytes 0.00 0.00 - 0.07 K/uL   Polychromasia PRESENT     Comment: Performed at [facility], [place] [address] [place], [place] [address]  Prepare RBC     Status: None   Collection Time: 10/04/19  2:37 PM  Result Value Ref Range   Order Confirmation      ORDER PROCESSED BY BLOOD BANK Performed at [facility], [place] [address], [place], [place] [address]    Dg Chest 2 View  Result Date: 10/03/2019 CLINICAL DATA:  Shortness of breath EXAM: CHEST - 2 VIEW COMPARISON:  June 04, 2019 FINDINGS: The heart size and mediastinal contours are within normal limits. Both lungs are clear. The visualized skeletal structures are unremarkable. IMPRESSION: No acute cardiopulmonary process. Electronically Signed   By: Prudencio Pair M.D.   On: 10/03/2019 23:01   Ct Abdomen  Pelvis W Contrast  Result Date: 10/04/2019 CLINICAL DATA:  Abdominal pain left lower quadrant. Fever and shortness of breath. EXAM: CT ABDOMEN AND PELVIS WITH CONTRAST TECHNIQUE: Multidetector CT imaging of the abdomen and pelvis was performed using the standard protocol following bolus administration of intravenous contrast. CONTRAST:  174mL OMNIPAQUE IOHEXOL 300 MG/ML  SOLN COMPARISON:  None. FINDINGS: LOWER CHEST: No basilar pleural or apical pericardial effusion. HEPATOBILIARY: Normal hepatic contours. There is no intra- or extrahepatic biliary dilatation. The gallbladder is normal. PANCREAS: Normal pancreatic contours without pancreatic ductal dilatation or peripancreatic fluid collection. SPLEEN: Normal. ADRENALS/URINARY TRACT: --Adrenal glands: Normal. --Right kidney/ureter: Increased medullary density --Left kidney/ureter: Increased medullary density --Urinary bladder: Normal for degree of distention STOMACH/BOWEL: --Stomach/Duodenum: There is no hiatal hernia. The duodenal course and caliber  are normal. --Small bowel: No dilatation or inflammation. --Colon: No focal abnormality. --Appendix: Normal. VASCULAR/LYMPHATIC: Normal course and caliber of the major abdominal vessels. No abdominal or pelvic lymphadenopathy. REPRODUCTIVE: There is a large pelvic mass arising from the uterine fundus measuring 13.4 x 13.6 x 10.3 cm. 4.5 cm right adnexal cyst and 4.2 cm left adnexal cyst. MUSCULOSKELETAL. No bony spinal canal stenosis or focal osseous abnormality. OTHER: None. IMPRESSION: 1. No acute abdominal or pelvic abnormality. 2. Large fibroid arising from the uterine fundus, measuring up to 13.4 cm. 3. Bilateral adnexal cysts, measuring up to 4.5 cm on the right and 4.2 cm on the left. Electronically Signed   By: Ulyses Jarred M.D.   On: 10/04/2019 01:17   US Pelvic Complete W Transvaginal And Torsion R/o  Result Date: 10/04/2019 CLINICAL DATA:  Left lower quadrant pain EXAM: TRANSABDOMINAL AND TRANSVAGINAL  ULTRASOUND OF PELVIS DOPPLER ULTRASOUND OF OVARIES TECHNIQUE: Both transabdominal and transvaginal ultrasound examinations of the pelvis were performed. Transabdominal technique was performed for global imaging of the pelvis including uterus, ovaries, adnexal regions, and pelvic cul-de-sac. It was necessary to proceed with endovaginal exam following the transabdominal exam to visualize the ovaries and adnexa. Color and duplex Doppler ultrasound was utilized to evaluate blood flow to the ovaries. COMPARISON:  CT 10/04/2019, ultrasound 07/15/2015 FINDINGS: Uterus Measurements: 16.9 x 10.6 x 11.9 cm = volume: 1116.9 mL. Large solid mass within the central and fundal uterus measuring approximately 13.5 x 10.2 by 13.5 cm, corresponding to large mass on CT. Endometrium Could not be evaluated secondary to large uterine mass. Right ovary Measurements: 6 x 3.5 x 3.3 cm = volume: 37 mL. Cyst measuring 4.4 by 2.5 x 2.7 cm. Left ovary Measurements: 4.7 x 3.5 x 4 cm = volume: 34.3 mL. Cyst measuring 3.3 x 2.6 by 4.4 cm. Pulsed Doppler evaluation of both ovaries demonstrates normal low-resistance arterial and venous waveforms. Other findings No abnormal free fluid. IMPRESSION: 1. Markedly enlarged uterus with dominant solid mass occupying the central and fundal portion of the uterus, measuring up to 13.5 cm, presumably a large fibroid. Endometrium was obscured by the large uterine mass. 2. Negative for ovarian torsion 3. Bilateral renal cysts measuring up to 4.4 cm. Assuming premenopausal patient, this is almost certainly benign, and no specific imaging follow up is recommended according to the Society of Radiologists in [place] (D Clovis Riley et al. Management of Asymptomatic Ovarian and Other Adnexal Cysts Imaged at [place]: Society of Radiologists in [place] Statement 2010. Radiology 256 (Sept 2010): [id number].). Electronically Signed   By: Donavan Foil M.D.   On: 10/04/2019  02:54    Pending Labs Unresulted Labs (From admission, onward)    Start     Ordered   10/04/19 1431  Hepatitis B surface antigen  Once,   STAT     10/04/19 1430   10/04/19 1431  Hepatitis C antibody  Once,   STAT     10/04/19 1430   10/04/19 1430  Rapid HIV screen (HIV 1/2 Ab+Ag)  Once,   STAT     10/04/19 1430   10/04/19 1430  Culture, blood (routine x 2)  BLOOD CULTURE X 2,   R (with STAT occurrences)     10/04/19 1430   10/04/19 1042  Respiratory Panel by PCR  (Respiratory virus panel with precautions)  Once,   STAT     10/04/19 1041   10/04/19 0126  Urine culture  ONCE - STAT,   STAT  10/04/19 0126          Vitals/Pain Today's Vitals   10/04/19 1000 10/04/19 1127 10/04/19 1200 10/04/19 1226  BP: 110/74 (!) 109/97 (!) 121/93   Pulse: (!) 104 (!) 105 (!) 101 (!) 101  Resp: (!) 27 (!) 25 (!) 31 20  Temp:      TempSrc:      SpO2: 100% 98% 100% 100%  PainSc:        Isolation Precautions Droplet precaution  Medications Medications  acetaminophen (TYLENOL) tablet 650 mg (650 mg Oral Given 10/04/19 1348)    Or  acetaminophen (TYLENOL) suppository 650 mg ( Rectal See Alternative 10/04/19 1348)  ondansetron (ZOFRAN) tablet 4 mg (has no administration in time range)    Or  ondansetron (ZOFRAN) injection 4 mg (has no administration in time range)  megestrol (MEGACE) tablet 40 mg (0 mg Oral Hold 10/04/19 0820)  cefTRIAXone (ROCEPHIN) 2 g in sodium chloride 0.9 % 100 mL IVPB (has no administration in time range)  metroNIDAZOLE (FLAGYL) IVPB 500 mg (0 mg Intravenous Stopped 10/04/19 1236)  albuterol (PROVENTIL) (2.5 MG/3ML) 0.083% nebulizer solution 2.5 mg (2.5 mg Nebulization Given 10/04/19 1226)  budesonide (PULMICORT) nebulizer solution 0.25 mg (0.25 mg Nebulization Given 10/04/19 0855)  albuterol (PROVENTIL) (2.5 MG/3ML) 0.083% nebulizer solution 2.5 mg (has no administration in time range)  traMADol (ULTRAM) tablet 50 mg (50 mg Oral Given 10/04/19 0814)  0.9 %  sodium  chloride infusion (Manually program via Guardrails IV Fluids) (has no administration in time range)  0.9 % NaCl with KCl 20 mEq/ L  infusion (has no administration in time range)  sodium chloride flush (NS) 0.9 % injection 3 mL (3 mLs Intravenous Given 10/04/19 0016)  sodium chloride 0.9 % bolus 1,000 mL (0 mLs Intravenous Stopped 10/04/19 0127)  acetaminophen (TYLENOL) tablet 650 mg (650 mg Oral Given 10/04/19 0016)  morphine 4 MG/ML injection 4 mg (4 mg Intravenous Given 10/04/19 0014)  ondansetron (ZOFRAN) injection 4 mg (4 mg Intravenous Given 10/04/19 0015)  iohexol (OMNIPAQUE) 300 MG/ML solution 100 mL (100 mLs Intravenous Contrast Given 10/04/19 0043)  acetaminophen (TYLENOL) tablet 650 mg (650 mg Oral Given 10/04/19 0128)  cefTRIAXone (ROCEPHIN) 1 g in sodium chloride 0.9 % 100 mL IVPB (0 g Intravenous Stopped 10/04/19 0223)  metroNIDAZOLE (FLAGYL) IVPB 500 mg (0 mg Intravenous Stopped 10/04/19 0243)  0.9 %  sodium chloride infusion (0 mL/hr Intravenous Stopped 10/04/19 0529)  sodium chloride 0.9 % bolus 1,000 mL (0 mLs Intravenous Stopped 10/04/19 0243)    Mobility walks     Focused Assessments    R Recommendations: See Admitting Provider Note  Report given to:   Additional Notes:

## 2019-10-04 NOTE — H&P (Signed)
History and Physical    Deborah Melendez [id number] DOB: [birth year] DOA: 10/03/2019  PCP: Charlott Rakes, MD  Patient coming from: Home.  Chief Complaint: Abdominal pain.  HPI: Deborah Melendez is a 43 y.o. female with history of asthma/COPD, polysubstance abuse chronic anemia, bipolar disorder was not meeting any of her medications presents to the ER because of increasing abdominal pain in the lower quadrant for the last 2 days.  Denies any nausea vomiting or diarrhea.  Has been having some wheezing and shortness of breath last few days.  Has chronic cough.  Pain is constant and also has been having irregular menstrual cycle with frequent small amount of vaginal bleeding.  ED Course: In the ER patient was tachycardic with fever 103 F.  Chest x-ray was unremarkable.  COVID-19 test came negative.  Influenza panel also came negative.  Blood cultures were obtained.  Since patient had abdominal pain had a CT abdomen pelvis which shows a large fibroid measuring around 13 cm which was followed by pelvic ultrasound which did not show any torsion of the ovary but did confirm the uterine fibroid.  Patient's blood work show hemoglobin of 6.5 which is a drop from 8 in June 2020.  WBC count was 16 lactate is 1.5 creatinine 1.03 albumin 3.3.  Patient was started on empiric antibiotics after blood cultures obtained 1 unit of PRBC transfusion was ordered and admitted for further management.  Review of Systems: As per HPI, rest all negative.   Past Medical History:  Diagnosis Date  . Asthma   . Bipolar 1 disorder, depressed, severe ([place]) 02/04/2017  . Cocaine abuse with cocaine-induced mood disorder ([place]) 07/27/2017  . COPD (chronic obstructive pulmonary disease) ([place])   . Depression   . Homicidal ideations   . Manic behavior ([place])   . 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 Substance or medication-induced bipolar and related disorder  with onset during intoxication ([place]) 09/08/2016  . Suicidal ideation     Past Surgical History:  Procedure Laterality Date  . FINGER SURGERY       reports that she has been smoking cigarettes. She has been smoking about 1.00 pack per day. She has never used smokeless tobacco. She reports current alcohol use. She reports current drug use. Frequency: 1.00 time per week. Drugs: Cocaine and Marijuana.  No Known Allergies  Family History  Problem Relation Age of Onset  . Diabetes Mother   . Hypertension Mother   . Drug abuse Father   . Schizophrenia Maternal Aunt     Prior to Admission medications   Medication Sig Start Date End Date Taking? Authorizing Provider  albuterol (VENTOLIN HFA) 108 (90 Base) MCG/ACT inhaler Inhale 2 puffs into the lungs every 6 (six) hours as needed for wheezing or shortness of breath. Patient not taking: Reported on 10/04/2019 04/21/19   Isabelle Course, MD  benzonatate (TESSALON) 100 MG capsule Take 1 capsule (100 mg total) by mouth 3 (three) times daily. Patient not taking: Reported on 10/04/2019 08/18/19   Gildardo Pounds, NP  ferrous sulfate 325 (65 FE) MG tablet Take 1 tablet (325 mg total) by mouth daily with breakfast. Patient not taking: Reported on 10/04/2019 04/21/19   Isabelle Course, MD  Fluticasone-Salmeterol (ADVAIR) 100-50 MCG/DOSE AEPB Inhale 1 puff into the lungs 2 (two) times daily. Patient not taking: Reported on 10/04/2019 08/18/19   Gildardo Pounds, NP  ipratropium-albuterol (DUONEB) 0.5-2.5 (3) MG/3ML SOLN Take 3 mLs by  nebulization every 4 (four) hours as needed. Patient not taking: Reported on 10/04/2019 08/18/19   Gildardo Pounds, NP  loratadine (CLARITIN) 10 MG tablet Take 1 tablet (10 mg total) by mouth daily. Patient not taking: Reported on 10/04/2019 08/18/19   Gildardo Pounds, NP  traZODone (DESYREL) 100 MG tablet Take 1 tablet (100 mg total) by mouth at bedtime as needed for sleep. Patient not taking: Reported on 10/04/2019 04/21/19    Isabelle Course, MD    Physical Exam: Constitutional: Moderately built and nourished. Vitals:   10/04/19 0257 10/04/19 0315 10/04/19 0445 10/04/19 0525  BP: 112/74 115/73 102/74 103/88  Pulse: (!) 105 (!) 103 (!) 105 (!) 102  Resp: (!) 26 (!) 23 (!) 34 (!) 25  Temp: 99.2 F (37.3 C)   99.7 F (37.6 C)  TempSrc: Oral   Oral  SpO2: 100% 100% 100% 100%   Eyes: Anicteric no pallor. ENMT: No discharge from the ears eyes nose or mouth. Neck: No mass felt.  No neck rigidity. Respiratory: Bilateral expiratory wheeze and no crepitations. Cardiovascular: S1-S2 heard. Abdomen: Mildly distended mild tenderness in the lower quadrants.  No guarding or rigidity. Musculoskeletal: No edema. Skin: No rash. Neurologic: Alert awake oriented to time place and person.  Moves all extremities. Psychiatric: Appears normal.   Labs on Admission: I have personally reviewed following labs and imaging studies  CBC: Recent Labs  Lab 10/03/19 2219  WBC 16.0*  NEUTROABS 13.6*  HGB 6.5*  HCT 23.5*  MCV 70.4*  PLT [id number]   Basic Metabolic Panel: Recent Labs  Lab 10/03/19 2219  NA 133*  K 3.4*  CL 102  CO2 20*  GLUCOSE 121*  BUN 6  CREATININE 1.03*  CALCIUM 8.7*   GFR: CrCl cannot be calculated (Unknown ideal weight.). Liver Function Tests: Recent Labs  Lab 10/03/19 2219  AST 15  ALT 8  ALKPHOS 65  BILITOT 0.6  PROT 7.3  ALBUMIN 3.3*   No results for input(s): LIPASE, AMYLASE in the last 168 hours. No results for input(s): AMMONIA in the last 168 hours. Coagulation Profile: No results for input(s): INR, PROTIME in the last 168 hours. Cardiac Enzymes: No results for input(s): CKTOTAL, CKMB, CKMBINDEX, TROPONINI in the last 168 hours. BNP (last 3 results) No results for input(s): PROBNP in the last 8760 hours. HbA1C: No results for input(s): HGBA1C in the last 72 hours. CBG: No results for input(s): GLUCAP in the last 168 hours. Lipid Profile: No results for input(s): CHOL, HDL,  LDLCALC, TRIG, CHOLHDL, LDLDIRECT in the last 72 hours. Thyroid Function Tests: No results for input(s): TSH, T4TOTAL, FREET4, T3FREE, THYROIDAB in the last 72 hours. Anemia Panel: No results for input(s): VITAMINB12, FOLATE, FERRITIN, TIBC, IRON, RETICCTPCT in the last 72 hours. Urine analysis:    Component Value Date/Time   COLORURINE YELLOW 10/03/2019 2233   APPEARANCEUR CLEAR 10/03/2019 2233   LABSPEC 1.013 10/03/2019 2233   PHURINE 8.0 10/03/2019 2233   GLUCOSEU NEGATIVE 10/03/2019 2233   HGBUR SMALL (A) 10/03/2019 2233   BILIRUBINUR NEGATIVE 10/03/2019 2233   KETONESUR 5 (A) 10/03/2019 2233   PROTEINUR NEGATIVE 10/03/2019 2233   UROBILINOGEN 1.0 08/08/2015 1512   NITRITE NEGATIVE 10/03/2019 2233   LEUKOCYTESUR NEGATIVE 10/03/2019 2233   Sepsis Labs: @LABRCNTIP (procalcitonin:4,lacticidven:4) ) Recent Results (from the past 240 hour(s))  SARS CORONAVIRUS 2 (TAT 6-24 HRS) Nasopharyngeal Nasopharyngeal Swab     Status: None   Collection Time: 10/04/19  1:02 AM   Specimen: Nasopharyngeal Swab  Result  Value Ref Range Status   SARS Coronavirus 2 NEGATIVE NEGATIVE Final    Comment: (NOTE) SARS-CoV-2 target nucleic acids are NOT DETECTED. The SARS-CoV-2 RNA is generally detectable in upper and lower respiratory specimens during the acute phase of infection. Negative results do not preclude SARS-CoV-2 infection, do not rule out co-infections with other pathogens, and should not be used as the sole basis for treatment or other patient management decisions. Negative results must be combined with clinical observations, patient history, and epidemiological information. The expected result is Negative. Fact Sheet for Patients: SugarRoll.be Fact Sheet for Healthcare Providers: https://www.woods-mathews.com/ This test is not yet approved or cleared by the [place] FDA and  has been authorized for detection and/or diagnosis of SARS-CoV-2  by FDA under an Emergency Use Authorization (EUA). This EUA will remain  in effect (meaning this test can be used) for the duration of the COVID-19 declaration under Section 56 4(b)(1) of the Act, 21 U.S.C. section 360bbb-3(b)(1), unless the authorization is terminated or revoked sooner. Performed at [facility], [place] [address] [place], [place] [address]   Group A Strep by PCR     Status: Abnormal   Collection Time: 10/04/19  3:03 AM   Specimen: Sterile Swab  Result Value Ref Range Status   Group A Strep by PCR DETECTED (A) NOT DETECTED Final    Comment: Performed at [facility], [place] [address], [place], [place] [address]  Wet prep, genital     Status: Abnormal   Collection Time: 10/04/19  3:19 AM  Result Value Ref Range Status   Yeast Wet Prep HPF POC NONE SEEN NONE SEEN Final   Trich, Wet Prep NONE SEEN NONE SEEN Final   Clue Cells Wet Prep HPF POC NONE SEEN NONE SEEN Final   WBC, Wet Prep HPF POC MANY (A) NONE SEEN Final   Sperm NONE SEEN  Final    Comment: Performed at [facility], [place] [address] [place], [place] [address]     Radiological Exams on Admission: Dg Chest 2 View  Result Date: 10/03/2019 CLINICAL DATA:  Shortness of breath EXAM: CHEST - 2 VIEW COMPARISON:  June 04, 2019 FINDINGS: The heart size and mediastinal contours are within normal limits. Both lungs are clear. The visualized skeletal structures are unremarkable. IMPRESSION: No acute cardiopulmonary process. Electronically Signed   By: Prudencio Pair M.D.   On: 10/03/2019 23:01   Ct Abdomen Pelvis W Contrast  Result Date: 10/04/2019 CLINICAL DATA:  Abdominal pain left lower quadrant. Fever and shortness of breath. EXAM: CT ABDOMEN AND PELVIS WITH CONTRAST TECHNIQUE: Multidetector CT imaging of the abdomen and pelvis was performed using the standard protocol following bolus administration of intravenous contrast. CONTRAST:  168mL OMNIPAQUE IOHEXOL 300 MG/ML  SOLN COMPARISON:  None. FINDINGS:  LOWER CHEST: No basilar pleural or apical pericardial effusion. HEPATOBILIARY: Normal hepatic contours. There is no intra- or extrahepatic biliary dilatation. The gallbladder is normal. PANCREAS: Normal pancreatic contours without pancreatic ductal dilatation or peripancreatic fluid collection. SPLEEN: Normal. ADRENALS/URINARY TRACT: --Adrenal glands: Normal. --Right kidney/ureter: Increased medullary density --Left kidney/ureter: Increased medullary density --Urinary bladder: Normal for degree of distention STOMACH/BOWEL: --Stomach/Duodenum: There is no hiatal hernia. The duodenal course and caliber are normal. --Small bowel: No dilatation or inflammation. --Colon: No focal abnormality. --Appendix: Normal. VASCULAR/LYMPHATIC: Normal course and caliber of the major abdominal vessels. No abdominal or pelvic lymphadenopathy. REPRODUCTIVE: There is a large pelvic mass arising from the uterine fundus measuring 13.4 x 13.6 x  10.3 cm. 4.5 cm right adnexal cyst and 4.2 cm left adnexal cyst. MUSCULOSKELETAL. No bony spinal canal stenosis or focal osseous abnormality. OTHER: None. IMPRESSION: 1. No acute abdominal or pelvic abnormality. 2. Large fibroid arising from the uterine fundus, measuring up to 13.4 cm. 3. Bilateral adnexal cysts, measuring up to 4.5 cm on the right and 4.2 cm on the left. Electronically Signed   By: Ulyses Jarred M.D.   On: 10/04/2019 01:17   US Pelvic Complete W Transvaginal And Torsion R/o  Result Date: 10/04/2019 CLINICAL DATA:  Left lower quadrant pain EXAM: TRANSABDOMINAL AND TRANSVAGINAL ULTRASOUND OF PELVIS DOPPLER ULTRASOUND OF OVARIES TECHNIQUE: Both transabdominal and transvaginal ultrasound examinations of the pelvis were performed. Transabdominal technique was performed for global imaging of the pelvis including uterus, ovaries, adnexal regions, and pelvic cul-de-sac. It was necessary to proceed with endovaginal exam following the transabdominal exam to visualize the ovaries and  adnexa. Color and duplex Doppler ultrasound was utilized to evaluate blood flow to the ovaries. COMPARISON:  CT 10/04/2019, ultrasound 07/15/2015 FINDINGS: Uterus Measurements: 16.9 x 10.6 x 11.9 cm = volume: 1116.9 mL. Large solid mass within the central and fundal uterus measuring approximately 13.5 x 10.2 by 13.5 cm, corresponding to large mass on CT. Endometrium Could not be evaluated secondary to large uterine mass. Right ovary Measurements: 6 x 3.5 x 3.3 cm = volume: 37 mL. Cyst measuring 4.4 by 2.5 x 2.7 cm. Left ovary Measurements: 4.7 x 3.5 x 4 cm = volume: 34.3 mL. Cyst measuring 3.3 x 2.6 by 4.4 cm. Pulsed Doppler evaluation of both ovaries demonstrates normal low-resistance arterial and venous waveforms. Other findings No abnormal free fluid. IMPRESSION: 1. Markedly enlarged uterus with dominant solid mass occupying the central and fundal portion of the uterus, measuring up to 13.5 cm, presumably a large fibroid. Endometrium was obscured by the large uterine mass. 2. Negative for ovarian torsion 3. Bilateral renal cysts measuring up to 4.4 cm. Assuming premenopausal patient, this is almost certainly benign, and no specific imaging follow up is recommended according to the Society of Radiologists in [place] (D Clovis Riley et al. Management of Asymptomatic Ovarian and Other Adnexal Cysts Imaged at [place]: Society of Radiologists in [place] Statement 2010. Radiology 256 (Sept 2010): [id number].). Electronically Signed   By: Donavan Foil M.D.   On: 10/04/2019 02:54    EKG: Independently reviewed.  Sinus tachycardia with nonspecific changes.  Assessment/Plan Principal Problem:   SIRS (systemic inflammatory response syndrome) (HCC) Active Problems:   Acute bronchitis with asthma with acute exacerbation   Acute blood loss anemia    1. SIRS -source not clear.  Presently was placed on empiric antibiotics ceftriaxone Flagyl which will continue until  blood cultures obtained.  Patient does have some productive cough and wheezing which could be contributing to patient's SIRS symptoms from bronchitis.  COVID-19 and influenza panel are negative. 2. Abdominal pain with large uterine fibroid with menorrhagia -discussed with Dr. Sheran Lawless on-call OB/GYN.  Advised to give patient Megace 40 mg daily 1 dose now and they will be seeing patient in consult. 3. Acute blood loss anemia secondary to frequent vaginal bleeding likely from uterine fibroid.  Megace has been ordered.  1 unit PRBC transfusion ordered.  Follow CBC. 4. Asthma exacerbation for which patient is on albuterol and Pulmicort.  May need steroids.  On empiric antibiotics. 5. Polysubstance abuse -advised about quitting and social work consult.   DVT prophylaxis: SCDs due to vaginal bleeding. Code Status:  Full code. Family Communication: Discussed with patient. Disposition Plan: Home. Consults called: OB/GYN. Admission status: Observation.   Rise Patience MD [facility] Pager [phone].  If 7PM-7AM, please contact night-coverage [email or web address] Password [email or web address]  10/04/2019, 5:52 AM

## 2019-10-05 DIAGNOSIS — D649 Anemia, unspecified: Secondary | ICD-10-CM

## 2019-10-05 DIAGNOSIS — R109 Unspecified abdominal pain: Secondary | ICD-10-CM

## 2019-10-05 DIAGNOSIS — J020 Streptococcal pharyngitis: Secondary | ICD-10-CM

## 2019-10-05 DIAGNOSIS — D72829 Elevated white blood cell count, unspecified: Secondary | ICD-10-CM

## 2019-10-05 LAB — TYPE AND SCREEN
ABO/RH(D): O POS
Antibody Screen: NEGATIVE
Unit division: 0
Unit division: 0

## 2019-10-05 LAB — BPAM RBC
Blood Product Expiration Date: 202011012359
Blood Product Expiration Date: 202011072359
ISSUE DATE / TIME: 202010070223
ISSUE DATE / TIME: 202010071641
Unit Type and Rh: 5100
Unit Type and Rh: 5100

## 2019-10-05 LAB — CBC
HCT: 26.7 % — ABNORMAL LOW (ref 36.0–46.0)
Hemoglobin: 8.1 g/dL — ABNORMAL LOW (ref 12.0–15.0)
MCH: 22.8 pg — ABNORMAL LOW (ref 26.0–34.0)
MCHC: 30.3 g/dL (ref 30.0–36.0)
MCV: 75 fL — ABNORMAL LOW (ref 80.0–100.0)
Platelets: 247 10*3/uL (ref 150–400)
RBC: 3.56 MIL/uL — ABNORMAL LOW (ref 3.87–5.11)
RDW: 21.7 % — ABNORMAL HIGH (ref 11.5–15.5)
WBC: 22.5 10*3/uL — ABNORMAL HIGH (ref 4.0–10.5)
nRBC: 0 % (ref 0.0–0.2)

## 2019-10-05 LAB — BASIC METABOLIC PANEL
Anion gap: 10 (ref 5–15)
BUN: 6 mg/dL (ref 6–20)
CO2: 20 mmol/L — ABNORMAL LOW (ref 22–32)
Calcium: 8.1 mg/dL — ABNORMAL LOW (ref 8.9–10.3)
Chloride: 108 mmol/L (ref 98–111)
Creatinine, Ser: 0.82 mg/dL (ref 0.44–1.00)
GFR calc Af Amer: >60 mL/min (ref >60)
GFR calc non Af Amer: >60 mL/min (ref >60)
Glucose, Bld: 103 mg/dL — ABNORMAL HIGH (ref 70–99)
Potassium: 3.3 mmol/L — ABNORMAL LOW (ref 3.5–5.1)
Sodium: 138 mmol/L (ref 135–145)

## 2019-10-05 LAB — URINE CULTURE: Culture: NO GROWTH

## 2019-10-05 MED ORDER — SODIUM CHLORIDE 0.9 % IV SOLN
2.0000 g | Freq: Two times a day (BID) | INTRAVENOUS | Status: DC
  Administered 2019-10-05 – 2019-10-07 (×5): 2 g via INTRAVENOUS
  Filled 2019-10-05 (×6): qty 2

## 2019-10-05 MED ORDER — MORPHINE SULFATE (PF) 2 MG/ML IV SOLN
2.0000 mg | Freq: Once | INTRAVENOUS | Status: AC
  Administered 2019-10-05: 06:00:00 2 mg via INTRAVENOUS
  Filled 2019-10-05: qty 1

## 2019-10-05 MED ORDER — TRAMADOL HCL 50 MG PO TABS
50.0000 mg | ORAL_TABLET | Freq: Four times a day (QID) | ORAL | Status: DC | PRN
  Administered 2019-10-05 – 2019-10-06 (×2): 50 mg via ORAL
  Filled 2019-10-05 (×2): qty 1

## 2019-10-05 MED ORDER — ALUM & MAG HYDROXIDE-SIMETH 200-200-20 MG/5ML PO SUSP: 30.0000 mL | ORAL | Status: DC | PRN

## 2019-10-05 MED ORDER — PNEUMOCOCCAL VAC POLYVALENT 25 MCG/0.5ML IJ INJ
0.5000 mL | INJECTION | INTRAMUSCULAR | Status: AC
  Administered 2019-10-06: 16:00:00 0.5 mL via INTRAMUSCULAR
  Filled 2019-10-05 (×2): qty 0.5

## 2019-10-05 MED ORDER — SODIUM CHLORIDE 0.9 % IV SOLN
100.0000 mg | Freq: Two times a day (BID) | INTRAVENOUS | Status: DC
  Administered 2019-10-05 – 2019-10-07 (×5): 100 mg via INTRAVENOUS
  Filled 2019-10-05 (×8): qty 100

## 2019-10-05 MED ORDER — MORPHINE SULFATE (PF) 2 MG/ML IV SOLN
2.0000 mg | INTRAVENOUS | Status: DC | PRN
  Administered 2019-10-06 – 2019-10-07 (×8): 2 mg via INTRAVENOUS
  Filled 2019-10-05 (×8): qty 1

## 2019-10-05 MED ORDER — PANTOPRAZOLE SODIUM 40 MG IV SOLR
40.0000 mg | INTRAVENOUS | Status: DC
  Administered 2019-10-05 – 2019-10-06 (×2): 40 mg via INTRAVENOUS
  Filled 2019-10-05 (×2): qty 40

## 2019-10-05 MED ORDER — POTASSIUM CHLORIDE CRYS ER 20 MEQ PO TBCR
40.0000 meq | EXTENDED_RELEASE_TABLET | Freq: Once | ORAL | Status: AC
  Administered 2019-10-05: 10:00:00 40 meq via ORAL
  Filled 2019-10-05: qty 2

## 2019-10-05 MED ORDER — INFLUENZA VAC SPLIT QUAD 0.5 ML IM SUSY
0.5000 mL | PREFILLED_SYRINGE | INTRAMUSCULAR | Status: AC
  Administered 2019-10-06: 12:00:00 0.5 mL via INTRAMUSCULAR
  Filled 2019-10-05: qty 0.5

## 2019-10-05 NOTE — Progress Notes (Signed)
Gynecology Progress Note  Admission Date: 10/03/2019 Current Date: 10/05/2019 2:39 PM  Markeya Denette Hatala is a 43 y.o. G6P4 HD#2 admitted for fever of unknown origin, abdominal/pelvic pain, anemia.  Gyn consulted for abnormal uterine bleeding and pelvic pain.   History complicated by: Patient Active Problem List   Diagnosis Date Noted  . SIRS (systemic inflammatory response syndrome) ([place]) 10/04/2019  . Acute blood loss anemia 10/04/2019  . Symptomatic anemia 04/18/2019  . SOB (shortness of breath) 01/18/2019  . Acute asthma exacerbation 01/18/2019  . MDD (major depressive disorder), severe ([place]) 12/21/2018  . Asthma, chronic 12/21/2018  . Bipolar I disorder, most recent episode depressed ([place]) 12/21/2018  . Altered mental status   . Acute respiratory failure with hypoxia ([place]) 10/14/2018  . Bronchitis 10/05/2018  . Acute bronchitis with asthma with acute exacerbation 10/04/2018  . Major depressive disorder, recurrent episode with mixed features ([place]) 01/27/2018  . Tobacco use disorder 05/22/2017  . Suicidal ideation   . Cannabis use disorder, severe, dependence ([place]) 01/20/2017  . Cocaine use disorder, severe, dependence ([place]) 08/08/2015    Subjective:  Patient feeling slightly better today. States pain is still present in LLQ but now radiates to entire abdomen. Pain improved but not significantly. No other complaints at this time. No nausea/vomiding. Denies diarrhea.  Objective:   Vitals:   10/04/19 2117 10/05/19 0534 10/05/19 1359 10/05/19 1435  BP: (!) 129/93 (!) 142/86 130/81   Pulse: 99 (!) 107 92   Resp: (!) 21 20 18    Temp: 97.9 F (36.6 C) 99 F (37.2 C) 98.4 F (36.9 C)   TempSrc: Oral Oral Oral   SpO2: 99% 100% 100% 98%  Weight:      Height:        No intake/output data recorded.  Intake/Output Summary (Last 24 hours) at 10/05/2019 1439 Last data filed at 10/04/2019 1909 Gross per 24 hour  Intake 318 ml  Output 300 ml  Net 18 ml      Physical exam: BP 130/81 (BP Location: Right Arm)   Pulse 92   Temp 98.4 F (36.9 C) (Oral)   Resp 18   Ht 5\' 3"  (1.6 m)   Wt 72.8 kg   SpO2 98%   BMI 28.43 kg/m  CONSTITUTIONAL: Well-developed, well-nourished female in no acute distress.  HENT:  Normocephalic, atraumatic, External right and left ear normal. Oropharynx is clear and moist EYES: Conjunctivae and EOM are normal. Pupils are equal, round, and reactive to light. No scleral icterus.  NECK: Normal range of motion, supple, no masses.  Normal thyroid.  SKIN: Skin is warm and dry. No rash noted. Not diaphoretic. No erythema. No pallor. NEUROLOGIC: Alert and oriented to person, place, and time. Normal reflexes, muscle tone coordination. No cranial nerve deficit noted. PSYCHIATRIC: Normal mood and affect. Normal behavior. Normal judgment and thought content. CARDIOVASCULAR: Normal heart rate noted RESPIRATORY: Effort normal, no problems with respiration noted. ABDOMEN: Soft, no distention noted. Mildly tender in LLQ, ino rebound or guarding.  PELVIC: deferred MUSCULOSKELETAL: Normal range of motion. No tenderness.  No cyanosis, clubbing, or edema.    UOP: voiding spontaneously  Labs  Recent Labs  Lab 10/03/19 2219 10/04/19 0819 10/05/19 0255  WBC 16.0* 21.8* 22.5*  HGB 6.5* 6.8* 8.1*  HCT 23.5* 23.3* 26.7*  PLT 366 251 247     Assessment & Plan:   Patient is 43 y.o. G6P4 admitted for fever of unknown origin, abdominal/pelvic pain and AUB. She is doing better today, pain  slightly less and overall appearance is much improved. Given the improvement on antibiotics and adnexal tenderness, would presume PID at this point. Agree with changing antibiotics to cefoxitin/doxy. GC/CT pending.   Cont abx for now, plan for transition to oral once she has significant improvement Regular diet Cont megace We will continue to follow     K. Arvilla Meres, M.D. Attending Center for [employer] farm [employer])

## 2019-10-05 NOTE — Progress Notes (Signed)
Patient ID: Deborah Melendez, female   DOB: [birth year], 43 y.o.   MRN: [id number]  PROGRESS NOTE    Maegen Senft  [id number] DOB: Oct 25, 1976 DOA: 10/03/2019 PCP: Charlott Rakes, MD   Brief Narrative:  43 year old female with history of asthma/COPD, polysubstance abuse, chronic anemia, bipolar disorder presented with abdominal pain.  In the ED, she was found to be tachycardic with fever of 103.  Chest x-ray was unremarkable.  COVID-19 test was negative.  CT of the abdomen pelvis showed large fibroid; pelvic ultrasound did not show any torsion of the ovary but did confirm uterine fibroid.  Hemoglobin was 6.5; was 8 in June 2020.  She had leukocytosis, was started on empiric antibiotics and 1 unit packed red cell transfusion was ordered.  GYN was consulted.  Assessment & Plan:   SIRS Group A strep bronchitis Leukocytosis -Presented with fever with leukocytosis.  Strep throat was positive.  Currently on Rocephin and Flagyl.  Afebrile for the last 24 hours.  Cultures negative so far. -COVID-19 and respiratory virus panel negative.  Discontinue isolation. -White count still elevated.  Monitor.  Abdominal pain with large uterine fibroid with menorrhagia; concern for PID as well -GYN following. -Still complaining of lot of pain.  Will follow further GYN recommendations. -We will switch antibiotics to cefoxitin and doxycycline.  Chlamydia/gonorrhea testing pending. -Add Protonix orally along with Maalox as needed  Acute blood loss anemia secondary to menorrhagia -Presented with hemoglobin of 6.5.  Transfused 1 unit of packed red cells.  Hemoglobin 8.1 this morning.  Asthma with mild exacerbation -Currently not wheezing.  Continue albuterol and Pulmicort.  Outpatient follow-up with PCP/pulmonary  Polysubstance abuse--advised about quitting.  Social worker consult.  DVT prophylaxis: SCDs due to vaginal bleed Code Status: Full Family Communication: None at bedside  Disposition Plan: Home in 1 to 2 days once clinically improved  Consultants: GYN  Procedures: None  Antimicrobials: Rocephin and Flagyl from 10/03/2019 onwards   Subjective: Patient seen and examined at bedside.  She still complains of severe abdominal pain and states that her abdomen is hurting all over.  Poor historian.  No overnight fever or vomiting.  Objective: Vitals:   10/04/19 1854 10/04/19 1909 10/04/19 2117 10/05/19 0534  BP:  116/80 (!) 129/93 (!) 142/86  Pulse:   99 (!) 107  Resp:   (!) 21 20  Temp:  98.7 F (37.1 C) 97.9 F (36.6 C) 99 F (37.2 C)  TempSrc:  Oral Oral Oral  SpO2:  100% 99% 100%  Weight: 72.8 kg     Height: 5\' 3"  (1.6 m)       Intake/Output Summary (Last 24 hours) at 10/05/2019 0944 Last data filed at 10/04/2019 1909 Gross per 24 hour  Intake 318 ml  Output 300 ml  Net 18 ml   Filed Weights   10/04/19 1854  Weight: 72.8 kg    Examination:  General exam: Appears in some distress secondary to abdominal pain.  Poor historian. Respiratory system: Bilateral decreased breath sounds at bases Cardiovascular system: S1 & S2 heard, Rate controlled Gastrointestinal system: Abdomen is nondistended, soft and tender mostly in the left lower quadrant.  No rebound tenderness. Normal bowel sounds heard. Extremities: No cyanosis, clubbing, edema  Central nervous system: Alert and oriented. No focal neurological deficits. Moving extremities Skin: No rashes, lesions or ulcers Psychiatry: Looks extremely anxious.    Data Reviewed: I have personally reviewed following labs and imaging studies  CBC: Recent Labs  Lab 10/03/19 2219 10/04/19 0819 10/05/19  0255  WBC 16.0* 21.8* 22.5*  NEUTROABS 13.6* 20.9*  --   HGB 6.5* 6.8* 8.1*  HCT 23.5* 23.3* 26.7*  MCV 70.4* 73.0* 75.0*  PLT 366 251 [id number]   Basic Metabolic Panel: Recent Labs  Lab 10/03/19 2219 10/04/19 0819 10/05/19 0255  NA 133* 136 138  K 3.4* 3.3* 3.3*  CL 102 108 108  CO2 20* 19*  20*  GLUCOSE 121* 137* 103*  BUN 6 6 6   CREATININE 1.03* 0.94 0.82  CALCIUM 8.7* 7.8* 8.1*   GFR: Estimated Creatinine Clearance: 85.5 mL/min (by C-G formula based on SCr of 0.82 mg/dL). Liver Function Tests: Recent Labs  Lab 10/03/19 2219 10/04/19 0819  AST 15 13*  ALT 8 8  ALKPHOS 65 59  BILITOT 0.6 0.4  PROT 7.3 6.4*  ALBUMIN 3.3* 2.7*   No results for input(s): LIPASE, AMYLASE in the last 168 hours. No results for input(s): AMMONIA in the last 168 hours. Coagulation Profile: No results for input(s): INR, PROTIME in the last 168 hours. Cardiac Enzymes: No results for input(s): CKTOTAL, CKMB, CKMBINDEX, TROPONINI in the last 168 hours. BNP (last 3 results) No results for input(s): PROBNP in the last 8760 hours. HbA1C: No results for input(s): HGBA1C in the last 72 hours. CBG: No results for input(s): GLUCAP in the last 168 hours. Lipid Profile: No results for input(s): CHOL, HDL, LDLCALC, TRIG, CHOLHDL, LDLDIRECT in the last 72 hours. Thyroid Function Tests: No results for input(s): TSH, T4TOTAL, FREET4, T3FREE, THYROIDAB in the last 72 hours. Anemia Panel: No results for input(s): VITAMINB12, FOLATE, FERRITIN, TIBC, IRON, RETICCTPCT in the last 72 hours. Sepsis Labs: Recent Labs  Lab 10/03/19 2219 10/04/19 0008  LATICACIDVEN 1.3 1.5    Recent Results (from the past 240 hour(s))  SARS CORONAVIRUS 2 (TAT 6-24 HRS) Nasopharyngeal Nasopharyngeal Swab     Status: None   Collection Time: 10/04/19  1:02 AM   Specimen: Nasopharyngeal Swab  Result Value Ref Range Status   SARS Coronavirus 2 NEGATIVE NEGATIVE Final    Comment: (NOTE) SARS-CoV-2 target nucleic acids are NOT DETECTED. The SARS-CoV-2 RNA is generally detectable in upper and lower respiratory specimens during the acute phase of infection. Negative results do not preclude SARS-CoV-2 infection, do not rule out co-infections with other pathogens, and should not be used as the sole basis for treatment or  other patient management decisions. Negative results must be combined with clinical observations, patient history, and epidemiological information. The expected result is Negative. Fact Sheet for Patients: SugarRoll.be Fact Sheet for Healthcare Providers: https://www.woods-mathews.com/ This test is not yet approved or cleared by the [place] FDA and  has been authorized for detection and/or diagnosis of SARS-CoV-2 by FDA under an Emergency Use Authorization (EUA). This EUA will remain  in effect (meaning this test can be used) for the duration of the COVID-19 declaration under Section 56 4(b)(1) of the Act, 21 U.S.C. section 360bbb-3(b)(1), unless the authorization is terminated or revoked sooner. Performed at [facility], [place] [address] [place], [place] [address]   Group A Strep by PCR     Status: Abnormal   Collection Time: 10/04/19  3:03 AM   Specimen: Sterile Swab  Result Value Ref Range Status   Group A Strep by PCR DETECTED (A) NOT DETECTED Final    Comment: Performed at [facility], [place] [address], [place], [place] [address]  Wet prep, genital     Status: Abnormal   Collection Time: 10/04/19  3:19 AM  Result Value Ref Range Status   Yeast Wet Prep HPF POC NONE SEEN NONE SEEN Final   Trich, Wet Prep NONE SEEN NONE SEEN Final   Clue Cells Wet Prep HPF POC NONE SEEN NONE SEEN Final   WBC, Wet Prep HPF POC MANY (A) NONE SEEN Final   Sperm NONE SEEN  Final    Comment: Performed at [facility], [place] [address], [place], [place] [address]  Respiratory Panel by PCR     Status: None   Collection Time: 10/04/19  3:50 AM   Specimen: Nasopharyngeal Swab; Respiratory  Result Value Ref Range Status   Adenovirus NOT DETECTED NOT DETECTED Final   Coronavirus 229E NOT DETECTED NOT DETECTED Final    Comment: (NOTE) The Coronavirus on the Respiratory Panel, DOES NOT test for the novel  Coronavirus (2019 nCoV)     Coronavirus HKU1 NOT DETECTED NOT DETECTED Final   Coronavirus NL63 NOT DETECTED NOT DETECTED Final   Coronavirus OC43 NOT DETECTED NOT DETECTED Final   Metapneumovirus NOT DETECTED NOT DETECTED Final   Rhinovirus / Enterovirus NOT DETECTED NOT DETECTED Final   Influenza A NOT DETECTED NOT DETECTED Final   Influenza B NOT DETECTED NOT DETECTED Final   Parainfluenza Virus 1 NOT DETECTED NOT DETECTED Final   Parainfluenza Virus 2 NOT DETECTED NOT DETECTED Final   Parainfluenza Virus 3 NOT DETECTED NOT DETECTED Final   Parainfluenza Virus 4 NOT DETECTED NOT DETECTED Final   Respiratory Syncytial Virus NOT DETECTED NOT DETECTED Final   Bordetella pertussis NOT DETECTED NOT DETECTED Final   Chlamydophila pneumoniae NOT DETECTED NOT DETECTED Final   Mycoplasma pneumoniae NOT DETECTED NOT DETECTED Final    Comment: Performed at [facility], [place]. [address], [place], [place] [address]  Urine culture     Status: None   Collection Time: 10/04/19  5:10 AM   Specimen: Urine, Clean Catch  Result Value Ref Range Status   Specimen Description URINE, CLEAN CATCH  Final   Special Requests NONE  Final   Culture   Final    NO GROWTH Performed at [facility], [place] [address] [place],  [address]    Report Status 10/05/2019 FINAL  Final         Radiology Studies: Dg Chest 2 View  Result Date: 10/03/2019 CLINICAL DATA:  Shortness of breath EXAM: CHEST - 2 VIEW COMPARISON:  June 04, 2019 FINDINGS: The heart size and mediastinal contours are within normal limits. Both lungs are clear. The visualized skeletal structures are unremarkable. IMPRESSION: No acute cardiopulmonary process. Electronically Signed   By: Prudencio Pair M.D.   On: 10/03/2019 23:01   Ct Abdomen Pelvis W Contrast  Result Date: 10/04/2019 CLINICAL DATA:  Abdominal pain left lower quadrant. Fever and shortness of breath. EXAM: CT ABDOMEN AND PELVIS WITH CONTRAST TECHNIQUE: Multidetector CT imaging of the abdomen and  pelvis was performed using the standard protocol following bolus administration of intravenous contrast. CONTRAST:  161mL OMNIPAQUE IOHEXOL 300 MG/ML  SOLN COMPARISON:  None. FINDINGS: LOWER CHEST: No basilar pleural or apical pericardial effusion. HEPATOBILIARY: Normal hepatic contours. There is no intra- or extrahepatic biliary dilatation. The gallbladder is normal. PANCREAS: Normal pancreatic contours without pancreatic ductal dilatation or peripancreatic fluid collection. SPLEEN: Normal. ADRENALS/URINARY TRACT: --Adrenal glands: Normal. --Right kidney/ureter: Increased medullary density --Left kidney/ureter: Increased medullary density --Urinary bladder: Normal for degree of distention STOMACH/BOWEL: --Stomach/Duodenum: There is no hiatal hernia. The duodenal course and caliber are normal. --Small bowel: No  dilatation or inflammation. --Colon: No focal abnormality. --Appendix: Normal. VASCULAR/LYMPHATIC: Normal course and caliber of the major abdominal vessels. No abdominal or pelvic lymphadenopathy. REPRODUCTIVE: There is a large pelvic mass arising from the uterine fundus measuring 13.4 x 13.6 x 10.3 cm. 4.5 cm right adnexal cyst and 4.2 cm left adnexal cyst. MUSCULOSKELETAL. No bony spinal canal stenosis or focal osseous abnormality. OTHER: None. IMPRESSION: 1. No acute abdominal or pelvic abnormality. 2. Large fibroid arising from the uterine fundus, measuring up to 13.4 cm. 3. Bilateral adnexal cysts, measuring up to 4.5 cm on the right and 4.2 cm on the left. Electronically Signed   By: Ulyses Jarred M.D.   On: 10/04/2019 01:17   US Pelvic Complete W Transvaginal And Torsion R/o  Result Date: 10/04/2019 CLINICAL DATA:  Left lower quadrant pain EXAM: TRANSABDOMINAL AND TRANSVAGINAL ULTRASOUND OF PELVIS DOPPLER ULTRASOUND OF OVARIES TECHNIQUE: Both transabdominal and transvaginal ultrasound examinations of the pelvis were performed. Transabdominal technique was performed for global imaging of the pelvis  including uterus, ovaries, adnexal regions, and pelvic cul-de-sac. It was necessary to proceed with endovaginal exam following the transabdominal exam to visualize the ovaries and adnexa. Color and duplex Doppler ultrasound was utilized to evaluate blood flow to the ovaries. COMPARISON:  CT 10/04/2019, ultrasound 07/15/2015 FINDINGS: Uterus Measurements: 16.9 x 10.6 x 11.9 cm = volume: 1116.9 mL. Large solid mass within the central and fundal uterus measuring approximately 13.5 x 10.2 by 13.5 cm, corresponding to large mass on CT. Endometrium Could not be evaluated secondary to large uterine mass. Right ovary Measurements: 6 x 3.5 x 3.3 cm = volume: 37 mL. Cyst measuring 4.4 by 2.5 x 2.7 cm. Left ovary Measurements: 4.7 x 3.5 x 4 cm = volume: 34.3 mL. Cyst measuring 3.3 x 2.6 by 4.4 cm. Pulsed Doppler evaluation of both ovaries demonstrates normal low-resistance arterial and venous waveforms. Other findings No abnormal free fluid. IMPRESSION: 1. Markedly enlarged uterus with dominant solid mass occupying the central and fundal portion of the uterus, measuring up to 13.5 cm, presumably a large fibroid. Endometrium was obscured by the large uterine mass. 2. Negative for ovarian torsion 3. Bilateral renal cysts measuring up to 4.4 cm. Assuming premenopausal patient, this is almost certainly benign, and no specific imaging follow up is recommended according to the Society of Radiologists in [place] (D Clovis Riley et al. Management of Asymptomatic Ovarian and Other Adnexal Cysts Imaged at [place]: Society of Radiologists in [place] Statement 2010. Radiology 256 (Sept 2010): [id number].). Electronically Signed   By: Donavan Foil M.D.   On: 10/04/2019 02:54        Scheduled Meds: . albuterol  2.5 mg Nebulization TID  . budesonide (PULMICORT) nebulizer solution  0.25 mg Nebulization BID  . influenza vac split quadrivalent PF  0.5 mL Intramuscular Tomorrow-1000  .  megestrol  40 mg Oral Daily  . pantoprazole (PROTONIX) IV  40 mg Intravenous Q24H  . pneumococcal 23 valent vaccine  0.5 mL Intramuscular Tomorrow-1000  . potassium chloride  40 mEq Oral Once   Continuous Infusions: . 0.9 % NaCl with KCl 20 mEq / L 75 mL/hr at 10/04/19 2018  . cefTRIAXone (ROCEPHIN)  IV 2 g (10/05/19 0230)  . metronidazole 500 mg (10/05/19 0127)          Aline August, MD [facility] 10/05/2019, 9:44 AM

## 2019-10-05 NOTE — Progress Notes (Signed)
Patient complains of abdominal pain 10/10.Patient states sharp and stabbing and just hurts.Patient lying in bed restless and crying .NP Chaney Malling paged awaiting a response.

## 2019-10-06 DIAGNOSIS — R45851 Suicidal ideations: Secondary | ICD-10-CM

## 2019-10-06 DIAGNOSIS — N730 Acute parametritis and pelvic cellulitis: Secondary | ICD-10-CM

## 2019-10-06 LAB — COMPREHENSIVE METABOLIC PANEL
ALT: 8 U/L (ref 0–44)
AST: 11 U/L — ABNORMAL LOW (ref 15–41)
Albumin: 2.4 g/dL — ABNORMAL LOW (ref 3.5–5.0)
Alkaline Phosphatase: 59 U/L (ref 38–126)
Anion gap: 5 (ref 5–15)
BUN: 6 mg/dL (ref 6–20)
CO2: 20 mmol/L — ABNORMAL LOW (ref 22–32)
Calcium: 7.9 mg/dL — ABNORMAL LOW (ref 8.9–10.3)
Chloride: 112 mmol/L — ABNORMAL HIGH (ref 98–111)
Creatinine, Ser: 0.8 mg/dL (ref 0.44–1.00)
GFR calc Af Amer: >60 mL/min (ref >60)
GFR calc non Af Amer: >60 mL/min (ref >60)
Glucose, Bld: 88 mg/dL (ref 70–99)
Potassium: 3.9 mmol/L (ref 3.5–5.1)
Sodium: 137 mmol/L (ref 135–145)
Total Bilirubin: 0.5 mg/dL (ref 0.3–1.2)
Total Protein: 6.1 g/dL — ABNORMAL LOW (ref 6.5–8.1)

## 2019-10-06 LAB — CBC WITH DIFFERENTIAL/PLATELET
Abs Immature Granulocytes: 0.1 10*3/uL — ABNORMAL HIGH (ref 0.00–0.07)
Basophils Absolute: 0.1 10*3/uL (ref 0.0–0.1)
Basophils Relative: 0 %
Eosinophils Absolute: 0.5 10*3/uL (ref 0.0–0.5)
Eosinophils Relative: 3 %
HCT: 25.3 % — ABNORMAL LOW (ref 36.0–46.0)
Hemoglobin: 7.5 g/dL — ABNORMAL LOW (ref 12.0–15.0)
Immature Granulocytes: 1 %
Lymphocytes Relative: 11 %
Lymphs Abs: 1.8 10*3/uL (ref 0.7–4.0)
MCH: 22.1 pg — ABNORMAL LOW (ref 26.0–34.0)
MCHC: 29.6 g/dL — ABNORMAL LOW (ref 30.0–36.0)
MCV: 74.4 fL — ABNORMAL LOW (ref 80.0–100.0)
Monocytes Absolute: 0.8 10*3/uL (ref 0.1–1.0)
Monocytes Relative: 5 %
Neutro Abs: 13 10*3/uL — ABNORMAL HIGH (ref 1.7–7.7)
Neutrophils Relative %: 80 %
Platelets: 279 10*3/uL (ref 150–400)
RBC: 3.4 MIL/uL — ABNORMAL LOW (ref 3.87–5.11)
RDW: 22.7 % — ABNORMAL HIGH (ref 11.5–15.5)
WBC: 16.2 10*3/uL — ABNORMAL HIGH (ref 4.0–10.5)
nRBC: 0 % (ref 0.0–0.2)

## 2019-10-06 LAB — MAGNESIUM: Magnesium: 1.7 mg/dL (ref 1.7–2.4)

## 2019-10-06 LAB — GC/CHLAMYDIA PROBE AMP (~~LOC~~) NOT AT ARMC
Chlamydia: NEGATIVE
Neisseria Gonorrhea: NEGATIVE

## 2019-10-06 MED ORDER — GABAPENTIN 300 MG PO CAPS
300.0000 mg | ORAL_CAPSULE | Freq: Three times a day (TID) | ORAL | Status: DC
  Administered 2019-10-06 – 2019-10-07 (×3): 300 mg via ORAL
  Filled 2019-10-06 (×3): qty 1

## 2019-10-06 MED ORDER — QUETIAPINE FUMARATE 25 MG PO TABS
25.0000 mg | ORAL_TABLET | Freq: Every day | ORAL | Status: DC
  Administered 2019-10-06: 22:00:00 25 mg via ORAL
  Filled 2019-10-06: qty 1

## 2019-10-06 MED ORDER — PANTOPRAZOLE SODIUM 40 MG PO TBEC
40.0000 mg | DELAYED_RELEASE_TABLET | Freq: Every day | ORAL | Status: DC
  Administered 2019-10-07: 11:00:00 40 mg via ORAL
  Filled 2019-10-06: qty 1

## 2019-10-06 MED ORDER — TRAZODONE HCL 50 MG PO TABS
100.0000 mg | ORAL_TABLET | Freq: Every day | ORAL | Status: DC
  Administered 2019-10-06: 22:00:00 100 mg via ORAL
  Filled 2019-10-06: qty 2

## 2019-10-06 NOTE — Progress Notes (Signed)
After receiving report, pt expressed having suicidal thoughts to [facility] [place]. This nurse and new grad nurse Delana Meyer entered room to ask pt if she had a plan. Pt said no, but that she was depressed because she wasn't getting her anti-depressants or the right pain medication. This nurse asked if pt was thinking of harming herself and she stated "yeah, sometimes". MD made aware and pt placed on suicidal precautions. This nurse, Jasmine RN, and [place] NT remained in the room to remove harmful objects. Pt continued to yell at this nurse, stating that she didn't like this nurses' smart ass mouth or attitude'. This nurse continued to let pt know that we were only here to help and explained to her what happens when a pt mentions suicidal ideation. Pt called this nurse a demon and repeatedly pressed the call button, yelling for someone to remove this nurse. She stated that she did not want this nurse in her room anymore for the rest of the day. The secretary, Tammy listened in on the call as a witness. Jasmine RN offered pt pain medicine and pt said no and asked for Thedore Mins to do it. We let her know that he was not allowed to do so. She stated that this nurse and Jasmine RN upset her and she didn't want [place] touching her. Pt eventually allowed [facility] RN to give medication. RN's still at bedside. Will continue to monitor.

## 2019-10-06 NOTE — Progress Notes (Signed)
Patient ID: Deborah Melendez, female   DOB: [birth year], 43 y.o.   MRN: [id number]  PROGRESS NOTE    Deborah Melendez  [id number] DOB: 1976-10-20 DOA: 10/03/2019 PCP: Charlott Rakes, MD   Brief Narrative:  43 year old female with history of asthma/COPD, polysubstance abuse, chronic anemia, bipolar disorder presented with abdominal pain.  In the ED, she was found to be tachycardic with fever of 103.  Chest x-ray was unremarkable.  COVID-19 test was negative.  CT of the abdomen pelvis showed large fibroid; pelvic ultrasound did not show any torsion of the ovary but did confirm uterine fibroid.  Hemoglobin was 6.5; was 8 in June 2020.  She had leukocytosis, was started on empiric antibiotics and 1 unit packed red cell transfusion was ordered.  GYN was consulted.  Assessment & Plan:  Suicidal ideation/severe depression/history of bipolar disorder -This morning, patient expresses that she is severely depressed and intermittently is crying.  She apparently also had mentioned something about harming herself prior to the nursing staff. -Suicide precautions.  Psychiatry evaluation.  The patient tries to leave prior to psych evaluation, she will have to be involuntarily committed. -Apparently she is not compliant with her psych meds.   SIRS Group A strep bronchitis Leukocytosis -Presented with fever with leukocytosis.  Strep throat was positive.  Currently on Rocephin and Flagyl.  Notably spikes of bilateral forearms.  Cultures negative so far. -COVID-19 and respiratory virus panel negative.   -White count still improving.  Monitor.  Abdominal pain with large uterine fibroid with menorrhagia; concern for PID as well -GYN following. -Still complaining of lot of pain.  GYN following.  Continue empiric treatment for PID with cefoxitin and doxycycline. - Chlamydia/gonorrhea testing pending. -Continue Protonix orally along with Maalox as needed  Acute blood loss anemia secondary to  menorrhagia -Presented with hemoglobin of 6.5.  Transfused 1 unit of packed red cells on admission.  Hemoglobin 7.5 this morning.  Asthma with mild exacerbation -Currently not wheezing.  Continue albuterol and Pulmicort.  Outpatient follow-up with PCP/pulmonary  Polysubstance abuse--advised about quitting.  Social worker consult.  DVT prophylaxis: SCDs due to vaginal bleed Code Status: Full Family Communication: None at bedside Disposition Plan: Home in 1 to 2 days once clinically improved and cleared by GYN/psychiatry  Consultants: GYN/psychiatry  Procedures: None  Antimicrobials: Rocephin and Flagyl from 10/03/2019-10/05/2019 Cefoxitin and doxycycline from 10/05/2019 onwards   Subjective: Patient seen and examined at bedside.  She feels very depressed and is intermittently crying.  States that she has hardly slept and her abdominal pain is not getting better.  No overnight fever or vomiting reported. Objective: Vitals:   10/05/19 2122 10/06/19 0310 10/06/19 0531 10/06/19 0853  BP: 130/87  (!) 115/93   Pulse: 96  90   Resp: 16 (!) 24 16   Temp: 98.8 F (37.1 C)  99.7 F (37.6 C)   TempSrc: Oral  Oral   SpO2: 100%  100% 100%  Weight:  70.6 kg    Height:        Intake/Output Summary (Last 24 hours) at 10/06/2019 1002 Last data filed at 10/06/2019 0941 Gross per 24 hour  Intake 1229.61 ml  Output 1490 ml  Net -260.39 ml   Filed Weights   10/04/19 1854 10/06/19 0310  Weight: 72.8 kg 70.6 kg    Examination:  General exam: Appears extremely anxious and intermittently is crying.  Respiratory system: Bilateral decreased breath sounds at bases.  Some scattered crackles Cardiovascular system: Rate controlled, S1-S2 heard Gastrointestinal system:  Abdomen is nondistended, soft and still tender mostly in the left lower quadrant.  No rebound tenderness. Normal bowel sounds heard. Extremities: No cyanosis, edema Central nervous system: Alert and oriented. No focal neurological  deficits. Moving extremities Skin: No rashes, lesions or ulcers Psychiatry: Very anxious.  Intermittently gets upset and cries.    Data Reviewed: I have personally reviewed following labs and imaging studies  CBC: Recent Labs  Lab 10/03/19 2219 10/04/19 0819 10/05/19 0255 10/06/19 0813  WBC 16.0* 21.8* 22.5* 16.2*  NEUTROABS 13.6* 20.9*  --  13.0*  HGB 6.5* 6.8* 8.1* 7.5*  HCT 23.5* 23.3* 26.7* 25.3*  MCV 70.4* 73.0* 75.0* 74.4*  PLT 366 251 247 [id number]   Basic Metabolic Panel: Recent Labs  Lab 10/03/19 2219 10/04/19 0819 10/05/19 0255 10/06/19 0813  NA 133* 136 138 137  K 3.4* 3.3* 3.3* 3.9  CL 102 108 108 112*  CO2 20* 19* 20* 20*  GLUCOSE 121* 137* 103* 88  BUN 6 6 6 6   CREATININE 1.03* 0.94 0.82 0.80  CALCIUM 8.7* 7.8* 8.1* 7.9*  MG  --   --   --  1.7   GFR: Estimated Creatinine Clearance: 86.3 mL/min (by C-G formula based on SCr of 0.8 mg/dL). Liver Function Tests: Recent Labs  Lab 10/03/19 2219 10/04/19 0819 10/06/19 0813  AST 15 13* 11*  ALT 8 8 8   ALKPHOS 65 59 59  BILITOT 0.6 0.4 0.5  PROT 7.3 6.4* 6.1*  ALBUMIN 3.3* 2.7* 2.4*   No results for input(s): LIPASE, AMYLASE in the last 168 hours. No results for input(s): AMMONIA in the last 168 hours. Coagulation Profile: No results for input(s): INR, PROTIME in the last 168 hours. Cardiac Enzymes: No results for input(s): CKTOTAL, CKMB, CKMBINDEX, TROPONINI in the last 168 hours. BNP (last 3 results) No results for input(s): PROBNP in the last 8760 hours. HbA1C: No results for input(s): HGBA1C in the last 72 hours. CBG: No results for input(s): GLUCAP in the last 168 hours. Lipid Profile: No results for input(s): CHOL, HDL, LDLCALC, TRIG, CHOLHDL, LDLDIRECT in the last 72 hours. Thyroid Function Tests: No results for input(s): TSH, T4TOTAL, FREET4, T3FREE, THYROIDAB in the last 72 hours. Anemia Panel: No results for input(s): VITAMINB12, FOLATE, FERRITIN, TIBC, IRON, RETICCTPCT in the last 72  hours. Sepsis Labs: Recent Labs  Lab 10/03/19 2219 10/04/19 0008  LATICACIDVEN 1.3 1.5    Recent Results (from the past 240 hour(s))  SARS CORONAVIRUS 2 (TAT 6-24 HRS) Nasopharyngeal Nasopharyngeal Swab     Status: None   Collection Time: 10/04/19  1:02 AM   Specimen: Nasopharyngeal Swab  Result Value Ref Range Status   SARS Coronavirus 2 NEGATIVE NEGATIVE Final    Comment: (NOTE) SARS-CoV-2 target nucleic acids are NOT DETECTED. The SARS-CoV-2 RNA is generally detectable in upper and lower respiratory specimens during the acute phase of infection. Negative results do not preclude SARS-CoV-2 infection, do not rule out co-infections with other pathogens, and should not be used as the sole basis for treatment or other patient management decisions. Negative results must be combined with clinical observations, patient history, and epidemiological information. The expected result is Negative. Fact Sheet for Patients: SugarRoll.be Fact Sheet for Healthcare Providers: https://www.woods-mathews.com/ This test is not yet approved or cleared by the [place] FDA and  has been authorized for detection and/or diagnosis of SARS-CoV-2 by FDA under an Emergency Use Authorization (EUA). This EUA will remain  in effect (meaning this test can be used) for the duration  of the COVID-19 declaration under Section 56 4(b)(1) of the Act, 21 U.S.C. section 360bbb-3(b)(1), unless the authorization is terminated or revoked sooner. Performed at [facility], [place] [address] [place], [place] [address]   Group A Strep by PCR     Status: Abnormal   Collection Time: 10/04/19  3:03 AM   Specimen: Sterile Swab  Result Value Ref Range Status   Group A Strep by PCR DETECTED (A) NOT DETECTED Final    Comment: Performed at [facility], [place] [address] [place], [place] [address]  Wet prep, genital     Status: Abnormal   Collection Time: 10/04/19  3:19  AM  Result Value Ref Range Status   Yeast Wet Prep HPF POC NONE SEEN NONE SEEN Final   Trich, Wet Prep NONE SEEN NONE SEEN Final   Clue Cells Wet Prep HPF POC NONE SEEN NONE SEEN Final   WBC, Wet Prep HPF POC MANY (A) NONE SEEN Final   Sperm NONE SEEN  Final    Comment: Performed at [facility], [place] [address], [place], [place] [address]  Respiratory Panel by PCR     Status: None   Collection Time: 10/04/19  3:50 AM   Specimen: Nasopharyngeal Swab; Respiratory  Result Value Ref Range Status   Adenovirus NOT DETECTED NOT DETECTED Final   Coronavirus 229E NOT DETECTED NOT DETECTED Final    Comment: (NOTE) The Coronavirus on the Respiratory Panel, DOES NOT test for the novel  Coronavirus (2019 nCoV)    Coronavirus HKU1 NOT DETECTED NOT DETECTED Final   Coronavirus NL63 NOT DETECTED NOT DETECTED Final   Coronavirus OC43 NOT DETECTED NOT DETECTED Final   Metapneumovirus NOT DETECTED NOT DETECTED Final   Rhinovirus / Enterovirus NOT DETECTED NOT DETECTED Final   Influenza A NOT DETECTED NOT DETECTED Final   Influenza B NOT DETECTED NOT DETECTED Final   Parainfluenza Virus 1 NOT DETECTED NOT DETECTED Final   Parainfluenza Virus 2 NOT DETECTED NOT DETECTED Final   Parainfluenza Virus 3 NOT DETECTED NOT DETECTED Final   Parainfluenza Virus 4 NOT DETECTED NOT DETECTED Final   Respiratory Syncytial Virus NOT DETECTED NOT DETECTED Final   Bordetella pertussis NOT DETECTED NOT DETECTED Final   Chlamydophila pneumoniae NOT DETECTED NOT DETECTED Final   Mycoplasma pneumoniae NOT DETECTED NOT DETECTED Final    Comment: Performed at [facility], [place]. [address], [place], [place] [address]  Urine culture     Status: None   Collection Time: 10/04/19  5:10 AM   Specimen: Urine, Clean Catch  Result Value Ref Range Status   Specimen Description URINE, CLEAN CATCH  Final   Special Requests NONE  Final   Culture   Final    NO GROWTH Performed at [facility], [place] [address] [place], [place] [address]    Report Status 10/05/2019 FINAL  Final  Culture, blood (routine x 2)     Status: None (Preliminary result)   Collection Time: 10/04/19  4:30 PM   Specimen: BLOOD  Result Value Ref Range Status   Specimen Description BLOOD RIGHT ANTECUBITAL  Final   Special Requests   Final    BOTTLES DRAWN AEROBIC AND ANAEROBIC Blood Culture adequate volume   Culture   Final    NO GROWTH < 24 HOURS Performed at [facility], [place] [address] [place], [place] [address]    Report Status PENDING  Incomplete  Culture, blood (routine x 2)  Status: None (Preliminary result)   Collection Time: 10/04/19  4:40 PM   Specimen: BLOOD LEFT HAND  Result Value Ref Range Status   Specimen Description BLOOD LEFT HAND  Final   Special Requests   Final    BOTTLES DRAWN AEROBIC AND ANAEROBIC Blood Culture results may not be optimal due to an inadequate volume of blood received in culture bottles   Culture   Final    NO GROWTH < 24 HOURS Performed at [facility], [place] [address] [place], [place] [address]    Report Status PENDING  Incomplete         Radiology Studies: No results found.      Scheduled Meds: . albuterol  2.5 mg Nebulization TID  . budesonide (PULMICORT) nebulizer solution  0.25 mg Nebulization BID  . influenza vac split quadrivalent PF  0.5 mL Intramuscular Tomorrow-1000  . megestrol  40 mg Oral Daily  . pantoprazole (PROTONIX) IV  40 mg Intravenous Q24H  . pneumococcal 23 valent vaccine  0.5 mL Intramuscular Tomorrow-1000   Continuous Infusions: . 0.9 % NaCl with KCl 20 mEq / L 75 mL/hr at 10/06/19 0658  . cefoTEtan (CEFOTAN) IV 2 g (10/05/19 2226)  . doxycycline (VIBRAMYCIN) IV 100 mg (10/06/19 0013)          Aline August, MD [facility] 10/06/2019, 10:02 AM

## 2019-10-06 NOTE — Progress Notes (Signed)
Gynecology Progress Note  Admission Date: 10/03/2019 Current Date: 10/06/2019 12:41 PM  Deborah Melendez is a 43 y.o. G6P4 HD#3 admitted for fever of unknown origin, abdominal/pelvic pain, anemia. Being treated for presumed PID.   History complicated by: Patient Active Problem List   Diagnosis Date Noted  . SIRS (systemic inflammatory response syndrome) ([place]) 10/04/2019  . Acute blood loss anemia 10/04/2019  . Symptomatic anemia 04/18/2019  . SOB (shortness of breath) 01/18/2019  . Acute asthma exacerbation 01/18/2019  . MDD (major depressive disorder), severe ([place]) 12/21/2018  . Asthma, chronic 12/21/2018  . Bipolar I disorder, most recent episode depressed ([place]) 12/21/2018  . Altered mental status   . Acute respiratory failure with hypoxia ([place]) 10/14/2018  . Bronchitis 10/05/2018  . Acute bronchitis with asthma with acute exacerbation 10/04/2018  . Major depressive disorder, recurrent episode with mixed features ([place]) 01/27/2018  . Tobacco use disorder 05/22/2017  . Suicidal ideation   . Cannabis use disorder, severe, dependence ([place]) 01/20/2017  . Cocaine use disorder, severe, dependence ([place]) 08/08/2015    Subjective:  Patient states she is not feeling much better this am. States she is still having significant pain, has not improved really from when she arrived. Reports she got worked up this morning and expressed suicidal ideation to staff "but didn't mean it like that" and that she does not intend to harm herself. States she got frustrated with not being able to sleep due to pain and having to get up to use the restroom frequently.   Objective:   Vitals:   10/06/19 0310 10/06/19 0531 10/06/19 0853 10/06/19 1239  BP:  (!) 115/93  118/84  Pulse:  90  89  Resp: (!) 24 16    Temp:  99.7 F (37.6 C)  98.6 F (37 C)  TempSrc:  Oral  Oral  SpO2:  100% 100% 100%  Weight: 70.6 kg     Height:        Total I/O In: 240 [P.O.:240] Out: 1500 [Urine:1500]   Intake/Output Summary (Last 24 hours) at 10/06/2019 1241 Last data filed at 10/06/2019 1239 Gross per 24 hour  Intake 1349.61 ml  Output 2090 ml  Net -740.39 ml     Physical exam: BP 118/84 (BP Location: Right Arm)   Pulse 89   Temp 98.6 F (37 C) (Oral)   Resp 16   Ht 5\' 3"  (1.6 m)   Wt 70.6 kg   SpO2 100%   BMI 27.57 kg/m  CONSTITUTIONAL: Well-developed, well-nourished female in no acute distress. Appears less ill than on arrival  HENT:  Normocephalic, atraumatic, External right and left ear normal. Oropharynx is clear and moist EYES: Conjunctivae and EOM are normal. Pupils are equal, round, and reactive to light. No scleral icterus.  NECK: Normal range of motion, supple, no masses.   SKIN: Skin is warm and dry. No rash noted. Not diaphoretic. No erythema. No pallor. NEUROLOGIC: Alert and oriented to person, place, and time. Normal reflexes, muscle tone coordination. No cranial nerve deficit noted. PSYCHIATRIC: Normal mood and affect. Normal behavior. Normal judgment and thought content. CARDIOVASCULAR: Normal heart rate noted RESPIRATORY: Effort normal, no problems with respiration noted. ABDOMEN: Soft, minimal tenderness with palpation in all quadrants, no rebound or guarding.  PELVIC: deferred MUSCULOSKELETAL: Normal range of motion. No tenderness.  No cyanosis, clubbing, or edema.    UOP: voiding spontaneously  Labs  Recent Labs  Lab 10/04/19 0819 10/05/19 0255 10/06/19 0813  WBC 21.8* 22.5* 16.2*  HGB  6.8* 8.1* 7.5*  HCT 23.3* 26.7* 25.3*  PLT 251 247 279     Assessment & Plan:   Patient is 43 y.o. G6P4 fever of unknown origin, abdominal/pelvic pain and AUB. Patient reports she is still having significant pain but she appears much improved overall. Abdominal exam is significantly better as patient can now tolerate exam with minimal pain. Reviewed her improvement and that although she is not reporting less pain, she appears much improved to me. Bleeding is  almost completely gone now as well.  Reviewed she is being treated for presumed PID and patient reports she believes she may have an untreated sexually transmitted infection and asks if this could be the cause of her PID. Reviewed that an untreated STI could cause this type of infection and that swab is not back yet. Reviewed she needs to have significant clinical improvement to be discharged from a PID standpoint and that could be as early as tomorrow. Reviewed she would need to go home on antibiotics and stressed the importance of taking the full antibiotics course prescribed for her. Also reviewed we will have her follow up in office after discharge for her PID as well as to discuss hysterectomy.  *Likely okay for discharge tomorrow from PID standpoint *Stable from AUB standpoint *Needs to be discharged with Flagyl 500 mg BID and Doxycyline 100 mg BID to complete 14 days of antibiotic treatment *Recommend discharge with megace 40 mg daily  I will arrange for follow up in our office.   For OB/GYN issues, please call the [facility] for [place] at [place] Monday - Friday, 8 am - 5 pm: [phone] All other times: [phone]   K. Arvilla Meres, M.D. Attending Center for [employer] [facility] manager)

## 2019-10-06 NOTE — Consult Note (Signed)
[facility] Consult   Reason for Consult:  Suicidal ideations Referring Physician:  Dr Aline August Patient Identification: Deborah Melendez MRN:  [id number] Principal Diagnosis: SIRS (systemic inflammatory response syndrome) ([place]) Diagnosis:  Principal Problem:   SIRS (systemic inflammatory response syndrome) ([place]) Active Problems:   Acute bronchitis with asthma with acute exacerbation   Bipolar I disorder, most recent episode depressed ([place])   Symptomatic anemia   Acute blood loss anemia  Total Time spent with patient: 1 hour  Subjective:   Deborah Melendez is a 43 y.o. female patient admitted with abdominal pain.  "I'm ok I was upset and wanted my Trazodone earlier.  I didn't mean to say it.  I'm not suicidal."  Reports she was upset and should not have said these things.  Patient seen and evaluated in person by this provider.  Adamantly denies suicidal ideations and reports she just wants her Trazodone for sleep and her gabapentin that she was taking.  Reports low level of depression and anxiety, no suicidal/homicidal ideations, hallucinations, or withdrawal symptoms.  History of cocaine abuse but declines rehab.  Admitted to [facility] last December and stabilized on Trazodone, Prozac, and Seroquel.  Patient contracts for safety.  Does not feel she needs hospitalization for mental health issues.  Reports she got upset earlier and said things she did not mean.  HPI per MD:  Deborah Melendez is a 43 y.o. female with history of asthma/COPD, polysubstance abuse chronic anemia, bipolar disorder was not meeting any of her medications presents to the ER because of increasing abdominal pain in the lower quadrant for the last 2 days.  Denies any nausea vomiting or diarrhea.  Has been having some wheezing and shortness of breath last few days.  Has chronic cough.  Pain is constant and also has been having irregular menstrual cycle with frequent small amount of  vaginal bleeding  Past Psychiatric History: substance abuse, bipolar d/o  Risk to Self:  none Risk to Others:   Prior Inpatient Therapy:  [facility] Prior Outpatient Therapy:  [facility]  Past Medical History:  Past Medical History:  Diagnosis Date  . Asthma   . Bipolar 1 disorder, depressed, severe ([place]) 02/04/2017  . Cocaine abuse with cocaine-induced mood disorder ([place]) 07/27/2017  . COPD (chronic obstructive pulmonary disease) ([place])   . Depression   . Homicidal ideations   . Manic behavior ([place])   . MDD (major depressive disorder), recurrent severe, without psychosis ([place]) 08/08/2015  . Substance or medication-induced bipolar and related disorder with onset during intoxication ([place]) 09/08/2016  . Suicidal ideation     Past Surgical History:  Procedure Laterality Date  . FINGER SURGERY     Family History:  Family History  Problem Relation Age of Onset  . Diabetes Mother   . Hypertension Mother   . Drug abuse Father   . Schizophrenia Maternal Aunt    Family Psychiatric  History: see above Social History:  Social History   Substance and Sexual Activity  Alcohol Use Yes   Comment: pt denies drinking at this time     Social History   Substance and Sexual Activity  Drug Use Yes  . Frequency: 1.0 times per week  . Types: Cocaine, Marijuana   Comment:  last used cocaine today    Social History   Socioeconomic History  . Marital status: Single    Spouse name: Not on file  . Number of children: Not on file  . Years of education: Not on file  .  Highest education level: Not on file  Occupational History  . Not on file  Social Needs  . Financial resource strain: Not on file  . Food insecurity    Worry: Not on file    Inability: Not on file  . Transportation needs    Medical: Not on file    Non-medical: Not on file  Tobacco Use  . Smoking status: Current Every Day Smoker    Packs/day: 1.00    Types: Cigarettes  . Smokeless tobacco: Never Used  Substance and Sexual  Activity  . Alcohol use: Yes    Comment: pt denies drinking at this time  . Drug use: Yes    Frequency: 1.0 times per week    Types: Cocaine, Marijuana    Comment:  last used cocaine today  . Sexual activity: Yes    Birth control/protection: None  Lifestyle  . Physical activity    Days per week: Not on file    Minutes per session: Not on file  . Stress: Not on file  Relationships  . Social Herbalist on phone: Not on file    Gets together: Not on file    Attends religious service: Not on file    Active member of club or organization: Not on file    Attends meetings of clubs or organizations: Not on file    Relationship status: Not on file  Other Topics Concern  . Not on file  Social History Narrative  . Not on file   Additional Social History:    Allergies:  No Known Allergies  Labs:  Results for orders placed or performed during the hospital encounter of 10/03/19 (from the past 48 hour(s))  Culture, blood (routine x 2)     Status: None (Preliminary result)   Collection Time: 10/04/19  4:30 PM   Specimen: BLOOD  Result Value Ref Range   Specimen Description BLOOD RIGHT ANTECUBITAL    Special Requests      BOTTLES DRAWN AEROBIC AND ANAEROBIC Blood Culture adequate volume   Culture      NO GROWTH 2 DAYS Performed at [facility], [place] [address], [place], [place] [address]    Report Status PENDING   Rapid HIV screen (HIV 1/2 Ab+Ag)     Status: None   Collection Time: 10/04/19  4:35 PM  Result Value Ref Range   HIV-1 P24 Antigen - HIV24 NON REACTIVE NON REACTIVE    Comment: (NOTE) Detection of p24 may be inhibited by biotin in the sample, causing false negative results in acute infection.    HIV 1/2 Antibodies NON REACTIVE NON REACTIVE   Interpretation (HIV Ag Ab)      A non reactive test result means that HIV 1 or HIV 2 antibodies and HIV 1 p24 antigen were not detected in the specimen.    Comment: Performed at [facility], [place] [address] [place], [place] [address]  Hepatitis B surface antigen     Status: None   Collection Time: 10/04/19  4:35 PM  Result Value Ref Range   Hepatitis B Surface Ag NON REACTIVE NON REACTIVE    Comment: Performed at [facility] [address] [place],  [address]  Hepatitis C antibody     Status: None   Collection Time: 10/04/19  4:35 PM  Result Value Ref Range   HCV Ab NON REACTIVE NON REACTIVE    Comment: (NOTE) Nonreactive HCV antibody screen is consistent with no  HCV infections,  unless recent infection is suspected or other evidence exists to indicate HCV infection. Performed at [facility], [place] [address], [place], [place] [address]   Culture, blood (routine x 2)     Status: None (Preliminary result)   Collection Time: 10/04/19  4:40 PM   Specimen: BLOOD LEFT HAND  Result Value Ref Range   Specimen Description BLOOD LEFT HAND    Special Requests      BOTTLES DRAWN AEROBIC AND ANAEROBIC Blood Culture results may not be optimal due to an inadequate volume of blood received in culture bottles   Culture      NO GROWTH 2 DAYS Performed at [facility], [place] [address] [place], [place] [address]    Report Status PENDING   CBC     Status: Abnormal   Collection Time: 10/05/19  2:55 AM  Result Value Ref Range   WBC 22.5 (H) 4.0 - 10.5 K/uL   RBC 3.56 (L) 3.87 - 5.11 MIL/uL   Hemoglobin 8.1 (L) 12.0 - 15.0 g/dL    Comment: REPEATED TO VERIFY POST TRANSFUSION SPECIMEN Reticulocyte Hemoglobin testing may be clinically indicated, consider ordering this additional test [id number]    HCT 26.7 (L) 36.0 - 46.0 %   MCV 75.0 (L) 80.0 - 100.0 fL   MCH 22.8 (L) 26.0 - 34.0 pg   MCHC 30.3 30.0 - 36.0 g/dL   RDW 21.7 (H) 11.5 - 15.5 %   Platelets 247 150 - 400 K/uL   nRBC 0.0 0.0 - 0.2 %    Comment: Performed at [facility], [place] [address], [place], [place] [id number]  Basic metabolic panel     Status: Abnormal   Collection Time: 10/05/19  2:55 AM  Result  Value Ref Range   Sodium 138 135 - 145 mmol/L   Potassium 3.3 (L) 3.5 - 5.1 mmol/L   Chloride 108 98 - 111 mmol/L   CO2 20 (L) 22 - 32 mmol/L   Glucose, Bld 103 (H) 70 - 99 mg/dL   BUN 6 6 - 20 mg/dL   Creatinine, Ser 0.82 0.44 - 1.00 mg/dL   Calcium 8.1 (L) 8.9 - 10.3 mg/dL   GFR calc non Af Amer >60 >60 mL/min   GFR calc Af Amer >60 >60 mL/min   Anion gap 10 5 - 15    Comment: Performed at [facility], [place] [address], [place],  [address]  CBC with Differential/Platelet     Status: Abnormal   Collection Time: 10/06/19  8:13 AM  Result Value Ref Range   WBC 16.2 (H) 4.0 - 10.5 K/uL   RBC 3.40 (L) 3.87 - 5.11 MIL/uL   Hemoglobin 7.5 (L) 12.0 - 15.0 g/dL    Comment: Reticulocyte Hemoglobin testing may be clinically indicated, consider ordering this additional test [id number]    HCT 25.3 (L) 36.0 - 46.0 %   MCV 74.4 (L) 80.0 - 100.0 fL   MCH 22.1 (L) 26.0 - 34.0 pg   MCHC 29.6 (L) 30.0 - 36.0 g/dL   RDW 22.7 (H) 11.5 - 15.5 %   Platelets 279 150 - 400 K/uL    Comment: REPEATED TO VERIFY   nRBC 0.0 0.0 - 0.2 %   Neutrophils Relative % 80 %   Neutro Abs 13.0 (H) 1.7 - 7.7 K/uL   Lymphocytes Relative 11 %   Lymphs Abs 1.8 0.7 - 4.0 K/uL   Monocytes Relative 5 %   Monocytes Absolute  0.8 0.1 - 1.0 K/uL   Eosinophils Relative 3 %   Eosinophils Absolute 0.5 0.0 - 0.5 K/uL   Basophils Relative 0 %   Basophils Absolute 0.1 0.0 - 0.1 K/uL   Immature Granulocytes 1 %   Abs Immature Granulocytes 0.10 (H) 0.00 - 0.07 K/uL    Comment: Performed at [facility] [address] [place], [place] [address]  Comprehensive metabolic panel     Status: Abnormal   Collection Time: 10/06/19  8:13 AM  Result Value Ref Range   Sodium 137 135 - 145 mmol/L   Potassium 3.9 3.5 - 5.1 mmol/L   Chloride 112 (H) 98 - 111 mmol/L   CO2 20 (L) 22 - 32 mmol/L   Glucose, Bld 88 70 - 99 mg/dL   BUN 6 6 - 20 mg/dL   Creatinine, Ser 0.80 0.44 - 1.00 mg/dL   Calcium 7.9 (L) 8.9 - 10.3  mg/dL   Total Protein 6.1 (L) 6.5 - 8.1 g/dL   Albumin 2.4 (L) 3.5 - 5.0 g/dL   AST 11 (L) 15 - 41 U/L   ALT 8 0 - 44 U/L   Alkaline Phosphatase 59 38 - 126 U/L   Total Bilirubin 0.5 0.3 - 1.2 mg/dL   GFR calc non Af Amer >60 >60 mL/min   GFR calc Af Amer >60 >60 mL/min   Anion gap 5 5 - 15    Comment: Performed at [facility], [place] [address] [place], [place] [address]  Magnesium     Status: None   Collection Time: 10/06/19  8:13 AM  Result Value Ref Range   Magnesium 1.7 1.7 - 2.4 mg/dL    Comment: Performed at [facility] [address], [place], [place] [address]    Current Facility-Administered Medications  Medication Dose Route Frequency Provider Last Rate Last Dose  . acetaminophen (TYLENOL) tablet 1,000 mg  1,000 mg Oral Q6H PRN Eulogio Bear U, DO   1,000 mg at 10/05/19 1012  . albuterol (PROVENTIL) (2.5 MG/3ML) 0.083% nebulizer solution 2.5 mg  2.5 mg Nebulization Q2H PRN Rise Patience, MD      . albuterol (PROVENTIL) (2.5 MG/3ML) 0.083% nebulizer solution 2.5 mg  2.5 mg Nebulization TID Eulogio Bear U, DO   2.5 mg at 10/06/19 0853  . alum & mag hydroxide-simeth (MAALOX/MYLANTA) 200-200-20 MG/5ML suspension 30 mL  30 mL Oral Q4H PRN Alekh, Kshitiz, MD      . budesonide (PULMICORT) nebulizer solution 0.25 mg  0.25 mg Nebulization BID Rise Patience, MD   0.25 mg at 10/06/19 0853  . cefoTEtan (CEFOTAN) 2 g in sodium chloride 0.9 % 100 mL IVPB  2 g Intravenous Q12H Alekh, Kshitiz, MD 200 mL/hr at 10/06/19 1133 2 g at 10/06/19 1133  . doxycycline (VIBRAMYCIN) 100 mg in sodium chloride 0.9 % 250 mL IVPB  100 mg Intravenous Q12H Alekh, Kshitiz, MD 125 mL/hr at 10/06/19 1244 100 mg at 10/06/19 1244  . megestrol (MEGACE) tablet 40 mg  40 mg Oral Daily Rise Patience, MD   40 mg at 10/06/19 1132  . morphine 2 MG/ML injection 2 mg  2 mg Intravenous Q2H PRN Aline August, MD   2 mg at 10/06/19 1130  . ondansetron (ZOFRAN) tablet 4 mg  4 mg Oral Q6H PRN  Rise Patience, MD   4 mg at 10/06/19 0010   Or  . ondansetron (ZOFRAN) injection 4 mg  4 mg Intravenous Q6H PRN Hal Hope,  Doreatha Lew, MD   4 mg at 10/06/19 0545  . [START ON 10/07/2019] pantoprazole (PROTONIX) EC tablet 40 mg  40 mg Oral Daily Alekh, Kshitiz, MD      . pneumococcal 23 valent vaccine (PNU-IMMUNE) injection 0.5 mL  0.5 mL Intramuscular Tomorrow-1000 Alekh, Kshitiz, MD      . traMADol (ULTRAM) tablet 50 mg  50 mg Oral Q6H PRN Aline August, MD   50 mg at 10/06/19 0011    Musculoskeletal: Strength & Muscle Tone: within normal limits Gait & Station: normal Patient leans: N/A  Psychiatric Specialty Exam: Physical Exam  Nursing note and vitals reviewed. Constitutional: She is oriented to person, place, and time. She appears well-developed and well-nourished.  HENT:  Head: Normocephalic.  Neck: Normal range of motion.  Respiratory: Effort normal.  Musculoskeletal: Normal range of motion.  Neurological: She is alert and oriented to person, place, and time.  Psychiatric: Her speech is normal and behavior is normal. Judgment and thought content normal. Her mood appears anxious. Cognition and memory are normal. She exhibits a depressed mood.    Review of Systems  Psychiatric/Behavioral: Positive for depression and substance abuse. The patient is nervous/anxious.   All other systems reviewed and are negative.   Blood pressure 118/84, pulse 89, temperature 98.6 F (37 C), temperature source Oral, resp. rate 18, height 5\' 3"  (1.6 m), weight 70.6 kg, SpO2 100 %.Body mass index is 27.57 kg/m.  General Appearance: Disheveled  Eye Contact:  Good  Speech:  Normal Rate  Volume:  Normal  Mood:  Anxious and Depressed  Affect:  Congruent  Thought Process:  Coherent and Descriptions of Associations: Intact  Orientation:  Full (Time, Place, and Person)  Thought Content:  WDL and Logical  Suicidal Thoughts:  No  Homicidal Thoughts:  No  Memory:  Immediate;   Good Recent;    Good Remote;   Good  Judgement:  Fair  Insight:  Fair  Psychomotor Activity:  Normal  Concentration:  Concentration: Good and Attention Span: Good  Recall:  Good  Fund of Knowledge:  Fair  Language:  Good  Akathisia:  No  Handed:  Right  AIMS (if indicated):     Assets:  Leisure Time Resilience  ADL's:  Intact  Cognition:  WNL  Sleep:        Treatment Plan Summary: Bipolar affective disorder, depressed: -Start Seroquel 25 mg at bedtime  Insomnia: -Start Trazodone 100 mg at bedtime  Cocaine abuse: -Start gabapentin 300 mg TID   Disposition: No evidence of imminent risk to self or others at present.   Patient does not meet criteria for psychiatric inpatient admission.  Waylan Boga, NP 10/06/2019 3:29 PM

## 2019-10-07 DIAGNOSIS — F3130 Bipolar disorder, current episode depressed, mild or moderate severity, unspecified: Secondary | ICD-10-CM

## 2019-10-07 LAB — CBC WITH DIFFERENTIAL/PLATELET
Abs Immature Granulocytes: 0.09 10*3/uL — ABNORMAL HIGH (ref 0.00–0.07)
Basophils Absolute: 0.1 10*3/uL (ref 0.0–0.1)
Basophils Relative: 0 %
Eosinophils Absolute: 0.4 10*3/uL (ref 0.0–0.5)
Eosinophils Relative: 3 %
HCT: 27.8 % — ABNORMAL LOW (ref 36.0–46.0)
Hemoglobin: 8.4 g/dL — ABNORMAL LOW (ref 12.0–15.0)
Immature Granulocytes: 1 %
Lymphocytes Relative: 13 %
Lymphs Abs: 1.6 10*3/uL (ref 0.7–4.0)
MCH: 22.2 pg — ABNORMAL LOW (ref 26.0–34.0)
MCHC: 30.2 g/dL (ref 30.0–36.0)
MCV: 73.4 fL — ABNORMAL LOW (ref 80.0–100.0)
Monocytes Absolute: 0.5 10*3/uL (ref 0.1–1.0)
Monocytes Relative: 4 %
Neutro Abs: 9.7 10*3/uL — ABNORMAL HIGH (ref 1.7–7.7)
Neutrophils Relative %: 79 %
Platelets: UNDETERMINED 10*3/uL (ref 150–400)
RBC: 3.79 MIL/uL — ABNORMAL LOW (ref 3.87–5.11)
RDW: 23.7 % — ABNORMAL HIGH (ref 11.5–15.5)
WBC: 12.3 10*3/uL — ABNORMAL HIGH (ref 4.0–10.5)
nRBC: 0 % (ref 0.0–0.2)

## 2019-10-07 LAB — COMPREHENSIVE METABOLIC PANEL
ALT: 8 U/L (ref 0–44)
AST: 12 U/L — ABNORMAL LOW (ref 15–41)
Albumin: 2.4 g/dL — ABNORMAL LOW (ref 3.5–5.0)
Alkaline Phosphatase: 64 U/L (ref 38–126)
Anion gap: 9 (ref 5–15)
BUN: 6 mg/dL (ref 6–20)
CO2: 20 mmol/L — ABNORMAL LOW (ref 22–32)
Calcium: 8.5 mg/dL — ABNORMAL LOW (ref 8.9–10.3)
Chloride: 108 mmol/L (ref 98–111)
Creatinine, Ser: 0.85 mg/dL (ref 0.44–1.00)
GFR calc Af Amer: >60 mL/min (ref >60)
GFR calc non Af Amer: >60 mL/min (ref >60)
Glucose, Bld: 104 mg/dL — ABNORMAL HIGH (ref 70–99)
Potassium: 4.2 mmol/L (ref 3.5–5.1)
Sodium: 137 mmol/L (ref 135–145)
Total Bilirubin: 0.4 mg/dL (ref 0.3–1.2)
Total Protein: 6.9 g/dL (ref 6.5–8.1)

## 2019-10-07 LAB — LIPASE, BLOOD: Lipase: 22 U/L (ref 11–51)

## 2019-10-07 LAB — MAGNESIUM: Magnesium: 1.9 mg/dL (ref 1.7–2.4)

## 2019-10-07 LAB — PREGNANCY, URINE: Preg Test, Ur: NEGATIVE

## 2019-10-07 MED ORDER — GABAPENTIN 300 MG PO CAPS: 300.0000 mg | ORAL_CAPSULE | Freq: Three times a day (TID) | ORAL | 0 refills | Status: DC

## 2019-10-07 MED ORDER — PANTOPRAZOLE SODIUM 40 MG PO TBEC: 40.0000 mg | DELAYED_RELEASE_TABLET | Freq: Every day | ORAL | 0 refills | Status: DC

## 2019-10-07 MED ORDER — OXYCODONE HCL 5 MG PO TABS: 5.0000 mg | ORAL_TABLET | Freq: Four times a day (QID) | ORAL | 0 refills | Status: DC | PRN

## 2019-10-07 MED ORDER — BENZONATATE 100 MG PO CAPS: 100.0000 mg | ORAL_CAPSULE | Freq: Two times a day (BID) | ORAL | 0 refills | Status: DC | PRN

## 2019-10-07 MED ORDER — MEDROXYPROGESTERONE ACETATE 150 MG/ML IM SUSP
150.0000 mg | Freq: Once | INTRAMUSCULAR | Status: AC
  Administered 2019-10-07: 150 mg via INTRAMUSCULAR
  Filled 2019-10-07: qty 1

## 2019-10-07 MED ORDER — DOXYCYCLINE MONOHYDRATE 100 MG PO TABS: 100.0000 mg | ORAL_TABLET | Freq: Two times a day (BID) | ORAL | 0 refills | Status: AC

## 2019-10-07 MED ORDER — QUETIAPINE FUMARATE 25 MG PO TABS: 25.0000 mg | ORAL_TABLET | Freq: Every day | ORAL | 0 refills | Status: DC

## 2019-10-07 MED ORDER — TRAZODONE HCL 100 MG PO TABS: 100.0000 mg | ORAL_TABLET | Freq: Every day | ORAL | 0 refills | Status: DC

## 2019-10-07 MED ORDER — METRONIDAZOLE 500 MG PO TABS: 500.0000 mg | ORAL_TABLET | Freq: Three times a day (TID) | ORAL | 0 refills | Status: AC

## 2019-10-07 MED ORDER — ONDANSETRON HCL 4 MG PO TABS: 4.0000 mg | ORAL_TABLET | Freq: Four times a day (QID) | ORAL | 0 refills | Status: DC | PRN

## 2019-10-07 MED ORDER — SODIUM CHLORIDE 0.9 % IV SOLN
510.0000 mg | Freq: Once | INTRAVENOUS | Status: AC
  Administered 2019-10-07: 510 mg via INTRAVENOUS
  Filled 2019-10-07: qty 17

## 2019-10-07 MED ORDER — MEGESTROL ACETATE 40 MG PO TABS: 40.0000 mg | ORAL_TABLET | Freq: Every day | ORAL | 0 refills | Status: DC

## 2019-10-07 NOTE — Progress Notes (Signed)
Notified by telemetry at 0330 that this patient's HR was running between 110 and 120. This RN went to go assess patient and patient was sleeping and was not exhibiting any signs of distress. RN educated the patient that we needed to take her vital signs due to her elevated HR and respirations. Initially, the patient refused but after education she then allowed [place] to take her vital signs. Vital signs were taken with a BP 95/59, HR 106 in NSR, axillary temp 98.6, 99% on RA, and respirations in the 20's. Pt also refused to allow [place] to take her temperature by mouth. Mindy,Rapid Response RN notified at [phone] of the patient's MEWS score and elevated respirations and heart rate. Rapid response came to the bedside and was only able to auscultate the patient's lungs as the patient refused all other testing interventions saying that she just wanted to sleep. Patient also refused for [place] to take her vital signs according to the yellow MEWS protocol. [place],NP notified and she gave orders just to continue monitoring patient at this time since pt continues to refuse all interventions. Patient lying in the bed sleeping at this time with a suicide sitter at the bedside. Pt HR currently running in the low 100's with respirations in the 20's. Will continue to monitor per MD orders.

## 2019-10-07 NOTE — Progress Notes (Signed)
Patient ID: Deborah Melendez, female   DOB: 08-Jan-1976, 43 y.o.   MRN: [id number]  S. She has no specific complaints today. She tells me that her Nexplanon (placed at the [facility]) is now 42 years old.   O. VSS, AF Breathing, conversing normally Well nourished, well hydrated Black female, no apparent distress Abd- benign, uterus palpable at umbilicus minus 1  A/P. PID- cervical cultures pending, WBC trending downward, afebrile, on abx  2. Anemia presumable from her menorrhagia due to her large fibroid uterus. She is amenable to a hysterectomy so I have sent a message to my office to schedule her for a EMBX and pap smear.  3. Contraception- expired Nexplanon If her UPT today is negative, then I will give her a shot of depo provera prior to discharge home.  4. Anemia- since I plan to operate on her in the nearish future, I will give her a dose of fereheme prior to discharge home.  5. I have encouraged her to use condoms for STI protection.

## 2019-10-07 NOTE — Discharge Summary (Signed)
Physician Discharge Summary  Deborah Melendez [id number] DOB: [birth year] DOA: 10/03/2019  PCP: Charlott Rakes, MD  Admit date: 10/03/2019 Discharge date: 10/07/2019  Admitted From: Home Disposition: Home  Recommendations for Outpatient Follow-up:  1. Follow up with PCP in 1 week with repeat CBC/BMP 2. Outpatient follow-up with GYN and psychiatry 3. Compliant with medications and follow-up. 4. Abstain from illicit drug use 5. Follow up in ED if symptoms worsen or new appear   Home Health: No Equipment/Devices: None  Discharge Condition: Stable CODE STATUS: Full Diet recommendation: Heart healthy  Brief/Interim Summary: 43 year old female with history of asthma/COPD, polysubstance abuse, chronic anemia, bipolar disorder presented with abdominal pain.  In the ED, she was found to be tachycardic with fever of 103.  Chest x-ray was unremarkable.  COVID-19 test was negative.  CT of the abdomen pelvis showed large fibroid; pelvic ultrasound did not show any torsion of the ovary but did confirm uterine fibroid.  Hemoglobin was 6.5; was 8 in June 2020.  She had leukocytosis, was started on empiric antibiotics and 1 unit packed red cell transfusion was ordered.  GYN was consulted.  During the hospitalization, she complained of severe depression and question of suicidal intent.  Psychiatry evaluated the patient and started her on meds and recommended outpatient follow-up.  GI and has cleared the patient for discharge on oral antibiotics.  She will be discharged home today.  Discharge Diagnoses:   Suicidal ideation/severe depression/history of bipolar disorder -On 10/06/2019, patient expressed that she was severely depressed and overdose intermittently crying.  She apparently also had mentioned something about harming herself prior to the nursing staff. -Suicide precautions were initiated.  Psychiatry consulted.  Psych recommended Seroquel, trazodone and gabapentin.  No need for  inpatient psychiatric hospitalization as per psychiatry.  Outpatient follow-up with psychiatry.    SIRS Group A strep bronchitis Leukocytosis -Presented with fever with leukocytosis.  Strep throat was positive.    Cultures negative so far.  Currently afebrile and hemodynamically stable.  SIRS has resolved.  Antibiotic plan as below. -COVID-19 and respiratory virus panel negative.   -White count has much improved.  Abdominal pain with large uterine fibroid with menorrhagia; concern for PID as well -GYN evaluated the patient and has signed off. -Abdominal pain is improving.  Tolerating diet. Continue empiric treatment for PID currently on cefoxitin and doxycycline.  Will discharge on doxycycline and Flagyl for 12 more days to finish 2 weeks course of therapy as per GYN recommendations.  Continue Megace.  Outpatient follow-up with GYN. - Chlamydia/gonorrhea testing pending. -Continue Protonix orally for now.  Acute blood loss anemia secondary to menorrhagia -Presented with hemoglobin of 6.5.  Transfused 1 unit of packed red cells on admission.  Hemoglobin 8.4 this morning.  Asthma with mild exacerbation -Currently not wheezing.  Continue home regimen.  Outpatient follow-up with PCP/pulmonary  Polysubstance abuse--advised about quitting.  Social worker consult. Discharge Instructions  Discharge Instructions    Ambulatory referral to Obstetrics / Gynecology   Complete by: As directed    Ambulatory referral to Psychiatry   Complete by: As directed      Allergies as of 10/07/2019   No Known Allergies     Medication List    TAKE these medications   albuterol 108 (90 Base) MCG/ACT inhaler Commonly known as: VENTOLIN HFA Inhale 2 puffs into the lungs every 6 (six) hours as needed for wheezing or shortness of breath.   benzonatate 100 MG capsule Commonly known as: TESSALON Take 1 capsule (100 mg  total) by mouth 2 (two) times daily as needed for cough. What changed:   when to  take this  reasons to take this   doxycycline 100 MG tablet Commonly known as: ADOXA Take 1 tablet (100 mg total) by mouth 2 (two) times daily for 12 days.   ferrous sulfate 325 (65 FE) MG tablet Take 1 tablet (325 mg total) by mouth daily with breakfast.   Fluticasone-Salmeterol 100-50 MCG/DOSE Aepb Commonly known as: ADVAIR Inhale 1 puff into the lungs 2 (two) times daily.   gabapentin 300 MG capsule Commonly known as: NEURONTIN Take 1 capsule (300 mg total) by mouth 3 (three) times daily.   ipratropium-albuterol 0.5-2.5 (3) MG/3ML Soln Commonly known as: DUONEB Take 3 mLs by nebulization every 4 (four) hours as needed.   loratadine 10 MG tablet Commonly known as: CLARITIN Take 1 tablet (10 mg total) by mouth daily.   megestrol 40 MG tablet Commonly known as: MEGACE Take 1 tablet (40 mg total) by mouth daily.   metroNIDAZOLE 500 MG tablet Commonly known as: Flagyl Take 1 tablet (500 mg total) by mouth 3 (three) times daily for 12 days.   ondansetron 4 MG tablet Commonly known as: ZOFRAN Take 1 tablet (4 mg total) by mouth every 6 (six) hours as needed for nausea.   oxyCODONE 5 MG immediate release tablet Commonly known as: Roxicodone Take 1 tablet (5 mg total) by mouth every 6 (six) hours as needed for moderate pain.   pantoprazole 40 MG tablet Commonly known as: PROTONIX Take 1 tablet (40 mg total) by mouth daily.   QUEtiapine 25 MG tablet Commonly known as: SEROQUEL Take 1 tablet (25 mg total) by mouth at bedtime.   traZODone 100 MG tablet Commonly known as: DESYREL Take 1 tablet (100 mg total) by mouth at bedtime. What changed:   when to take this  reasons to take this      Follow-up Information    Charlott Rakes, MD. Schedule an appointment as soon as possible for a visit in 1 week(s).   Specialty: Family Medicine Why: with cbc/bmp Contact information: [place] [address] [phone]        Sloan Leiter, MD.  Schedule an appointment as soon as possible for a visit in 1 week(s).   Specialty: Obstetrics and Gynecology Contact information: [address] [place] [address] [phone]        psychiatrist. Schedule an appointment as soon as possible for a visit in 1 week(s).          No Known Allergies  Consultations:  GYN/psychiatry   Procedures/Studies: Dg Chest 2 View  Result Date: 10/03/2019 CLINICAL DATA:  Shortness of breath EXAM: CHEST - 2 VIEW COMPARISON:  June 04, 2019 FINDINGS: The heart size and mediastinal contours are within normal limits. Both lungs are clear. The visualized skeletal structures are unremarkable. IMPRESSION: No acute cardiopulmonary process. Electronically Signed   By: Prudencio Pair M.D.   On: 10/03/2019 23:01   Ct Abdomen Pelvis W Contrast  Result Date: 10/04/2019 CLINICAL DATA:  Abdominal pain left lower quadrant. Fever and shortness of breath. EXAM: CT ABDOMEN AND PELVIS WITH CONTRAST TECHNIQUE: Multidetector CT imaging of the abdomen and pelvis was performed using the standard protocol following bolus administration of intravenous contrast. CONTRAST:  155mL OMNIPAQUE IOHEXOL 300 MG/ML  SOLN COMPARISON:  None. FINDINGS: LOWER CHEST: No basilar pleural or apical pericardial effusion. HEPATOBILIARY: Normal hepatic contours. There is no intra- or extrahepatic biliary  dilatation. The gallbladder is normal. PANCREAS: Normal pancreatic contours without pancreatic ductal dilatation or peripancreatic fluid collection. SPLEEN: Normal. ADRENALS/URINARY TRACT: --Adrenal glands: Normal. --Right kidney/ureter: Increased medullary density --Left kidney/ureter: Increased medullary density --Urinary bladder: Normal for degree of distention STOMACH/BOWEL: --Stomach/Duodenum: There is no hiatal hernia. The duodenal course and caliber are normal. --Small bowel: No dilatation or inflammation. --Colon: No focal abnormality. --Appendix: Normal. VASCULAR/LYMPHATIC: Normal  course and caliber of the major abdominal vessels. No abdominal or pelvic lymphadenopathy. REPRODUCTIVE: There is a large pelvic mass arising from the uterine fundus measuring 13.4 x 13.6 x 10.3 cm. 4.5 cm right adnexal cyst and 4.2 cm left adnexal cyst. MUSCULOSKELETAL. No bony spinal canal stenosis or focal osseous abnormality. OTHER: None. IMPRESSION: 1. No acute abdominal or pelvic abnormality. 2. Large fibroid arising from the uterine fundus, measuring up to 13.4 cm. 3. Bilateral adnexal cysts, measuring up to 4.5 cm on the right and 4.2 cm on the left. Electronically Signed   By: Ulyses Jarred M.D.   On: 10/04/2019 01:17   US Pelvic Complete W Transvaginal And Torsion R/o  Result Date: 10/04/2019 CLINICAL DATA:  Left lower quadrant pain EXAM: TRANSABDOMINAL AND TRANSVAGINAL ULTRASOUND OF PELVIS DOPPLER ULTRASOUND OF OVARIES TECHNIQUE: Both transabdominal and transvaginal ultrasound examinations of the pelvis were performed. Transabdominal technique was performed for global imaging of the pelvis including uterus, ovaries, adnexal regions, and pelvic cul-de-sac. It was necessary to proceed with endovaginal exam following the transabdominal exam to visualize the ovaries and adnexa. Color and duplex Doppler ultrasound was utilized to evaluate blood flow to the ovaries. COMPARISON:  CT 10/04/2019, ultrasound 07/15/2015 FINDINGS: Uterus Measurements: 16.9 x 10.6 x 11.9 cm = volume: 1116.9 mL. Large solid mass within the central and fundal uterus measuring approximately 13.5 x 10.2 by 13.5 cm, corresponding to large mass on CT. Endometrium Could not be evaluated secondary to large uterine mass. Right ovary Measurements: 6 x 3.5 x 3.3 cm = volume: 37 mL. Cyst measuring 4.4 by 2.5 x 2.7 cm. Left ovary Measurements: 4.7 x 3.5 x 4 cm = volume: 34.3 mL. Cyst measuring 3.3 x 2.6 by 4.4 cm. Pulsed Doppler evaluation of both ovaries demonstrates normal low-resistance arterial and venous waveforms. Other findings No  abnormal free fluid. IMPRESSION: 1. Markedly enlarged uterus with dominant solid mass occupying the central and fundal portion of the uterus, measuring up to 13.5 cm, presumably a large fibroid. Endometrium was obscured by the large uterine mass. 2. Negative for ovarian torsion 3. Bilateral renal cysts measuring up to 4.4 cm. Assuming premenopausal patient, this is almost certainly benign, and no specific imaging follow up is recommended according to the Society of Radiologists in [place] (D Clovis Riley et al. Management of Asymptomatic Ovarian and Other Adnexal Cysts Imaged at [place]: Society of Radiologists in [place] Statement 2010. Radiology 256 (Sept 2010): [id number].). Electronically Signed   By: Donavan Foil M.D.   On: 10/04/2019 02:54       Subjective: Patient seen and examined at bedside.  She is a poor historian.  States that her abdominal pain is improving and she is tolerating diet.  Okay to go home.  No overnight fever or vomiting.  Discharge Exam: Vitals:   10/07/19 0641 10/07/19 0736  BP:    Pulse:    Resp: 19 19  Temp:    SpO2:      General: Pt is alert, awake, not in acute distress.  Poor historian Cardiovascular: Intermittent tachycardia overnight, S1/S2 + Respiratory:  bilateral decreased breath sounds at bases Abdominal: Soft, mild left lower quadrant tenderness present, ND, bowel sounds + Extremities: no edema, no cyanosis    The results of significant diagnostics from this hospitalization (including imaging, microbiology, ancillary and laboratory) are listed below for reference.     Microbiology: Recent Results (from the past 240 hour(s))  SARS CORONAVIRUS 2 (TAT 6-24 HRS) Nasopharyngeal Nasopharyngeal Swab     Status: None   Collection Time: 10/04/19  1:02 AM   Specimen: Nasopharyngeal Swab  Result Value Ref Range Status   SARS Coronavirus 2 NEGATIVE NEGATIVE Final    Comment: (NOTE) SARS-CoV-2 target  nucleic acids are NOT DETECTED. The SARS-CoV-2 RNA is generally detectable in upper and lower respiratory specimens during the acute phase of infection. Negative results do not preclude SARS-CoV-2 infection, do not rule out co-infections with other pathogens, and should not be used as the sole basis for treatment or other patient management decisions. Negative results must be combined with clinical observations, patient history, and epidemiological information. The expected result is Negative. Fact Sheet for Patients: SugarRoll.be Fact Sheet for Healthcare Providers: https://www.woods-mathews.com/ This test is not yet approved or cleared by the [place] FDA and  has been authorized for detection and/or diagnosis of SARS-CoV-2 by FDA under an Emergency Use Authorization (EUA). This EUA will remain  in effect (meaning this test can be used) for the duration of the COVID-19 declaration under Section 56 4(b)(1) of the Act, 21 U.S.C. section 360bbb-3(b)(1), unless the authorization is terminated or revoked sooner. Performed at [facility], [place] [address], [place], [place] [address]   Group A Strep by PCR     Status: Abnormal   Collection Time: 10/04/19  3:03 AM   Specimen: Sterile Swab  Result Value Ref Range Status   Group A Strep by PCR DETECTED (A) NOT DETECTED Final    Comment: Performed at [facility], [place] [address], [place], [place] [address]  Wet prep, genital     Status: Abnormal   Collection Time: 10/04/19  3:19 AM  Result Value Ref Range Status   Yeast Wet Prep HPF POC NONE SEEN NONE SEEN Final   Trich, Wet Prep NONE SEEN NONE SEEN Final   Clue Cells Wet Prep HPF POC NONE SEEN NONE SEEN Final   WBC, Wet Prep HPF POC MANY (A) NONE SEEN Final   Sperm NONE SEEN  Final    Comment: Performed at [facility], [place] [address] [place], [place] [address]  Respiratory Panel by PCR     Status: None   Collection Time:  10/04/19  3:50 AM   Specimen: Nasopharyngeal Swab; Respiratory  Result Value Ref Range Status   Adenovirus NOT DETECTED NOT DETECTED Final   Coronavirus 229E NOT DETECTED NOT DETECTED Final    Comment: (NOTE) The Coronavirus on the Respiratory Panel, DOES NOT test for the novel  Coronavirus (2019 nCoV)    Coronavirus HKU1 NOT DETECTED NOT DETECTED Final   Coronavirus NL63 NOT DETECTED NOT DETECTED Final   Coronavirus OC43 NOT DETECTED NOT DETECTED Final   Metapneumovirus NOT DETECTED NOT DETECTED Final   Rhinovirus / Enterovirus NOT DETECTED NOT DETECTED Final   Influenza A NOT DETECTED NOT DETECTED Final   Influenza B NOT DETECTED NOT DETECTED Final   Parainfluenza Virus 1 NOT DETECTED NOT DETECTED Final   Parainfluenza Virus 2 NOT DETECTED NOT DETECTED Final   Parainfluenza Virus 3 NOT DETECTED NOT DETECTED Final   Parainfluenza Virus 4  NOT DETECTED NOT DETECTED Final   Respiratory Syncytial Virus NOT DETECTED NOT DETECTED Final   Bordetella pertussis NOT DETECTED NOT DETECTED Final   Chlamydophila pneumoniae NOT DETECTED NOT DETECTED Final   Mycoplasma pneumoniae NOT DETECTED NOT DETECTED Final    Comment: Performed at [facility], [place] [address] [place], [place] [address]  Urine culture     Status: None   Collection Time: 10/04/19  5:10 AM   Specimen: Urine, Clean Catch  Result Value Ref Range Status   Specimen Description URINE, CLEAN CATCH  Final   Special Requests NONE  Final   Culture   Final    NO GROWTH Performed at [facility], [place] [address], [place], [place] [address]    Report Status 10/05/2019 FINAL  Final  Culture, blood (routine x 2)     Status: None (Preliminary result)   Collection Time: 10/04/19  4:30 PM   Specimen: BLOOD  Result Value Ref Range Status   Specimen Description BLOOD RIGHT ANTECUBITAL  Final   Special Requests   Final    BOTTLES DRAWN AEROBIC AND ANAEROBIC Blood Culture adequate volume   Culture   Final    NO GROWTH 2  DAYS Performed at [facility], [place] [address] [place], [place] [address]    Report Status PENDING  Incomplete  Culture, blood (routine x 2)     Status: None (Preliminary result)   Collection Time: 10/04/19  4:40 PM   Specimen: BLOOD LEFT HAND  Result Value Ref Range Status   Specimen Description BLOOD LEFT HAND  Final   Special Requests   Final    BOTTLES DRAWN AEROBIC AND ANAEROBIC Blood Culture results may not be optimal due to an inadequate volume of blood received in culture bottles   Culture   Final    NO GROWTH 2 DAYS Performed at [facility], [place] [address] [place], [place] [address]    Report Status PENDING  Incomplete     Labs: BNP (last 3 results) Recent Labs    10/14/18 0930 01/18/19 1449  BNP 28.3 [id number]   Basic Metabolic Panel: Recent Labs  Lab 10/03/19 2219 10/04/19 0819 10/05/19 0255 10/06/19 0813 10/07/19 0244  NA 133* 136 138 137 137  K 3.4* 3.3* 3.3* 3.9 4.2  CL 102 108 108 112* 108  CO2 20* 19* 20* 20* 20*  GLUCOSE 121* 137* 103* 88 104*  BUN 6 6 6 6 6   CREATININE 1.03* 0.94 0.82 0.80 0.85  CALCIUM 8.7* 7.8* 8.1* 7.9* 8.5*  MG  --   --   --  1.7 1.9   Liver Function Tests: Recent Labs  Lab 10/03/19 2219 10/04/19 0819 10/06/19 0813 10/07/19 0244  AST 15 13* 11* 12*  ALT 8 8 8 8   ALKPHOS 65 59 59 64  BILITOT 0.6 0.4 0.5 0.4  PROT 7.3 6.4* 6.1* 6.9  ALBUMIN 3.3* 2.7* 2.4* 2.4*   Recent Labs  Lab 10/07/19 0244  LIPASE 22   No results for input(s): AMMONIA in the last 168 hours. CBC: Recent Labs  Lab 10/03/19 2219 10/04/19 0819 10/05/19 0255 10/06/19 0813 10/07/19 0244  WBC 16.0* 21.8* 22.5* 16.2* 12.3*  NEUTROABS 13.6* 20.9*  --  13.0* 9.7*  HGB 6.5* 6.8* 8.1* 7.5* 8.4*  HCT 23.5* 23.3* 26.7* 25.3* 27.8*  MCV 70.4* 73.0* 75.0* 74.4* 73.4*  PLT 366 251 247 279 PLATELET CLUMPS NOTED ON SMEAR, UNABLE TO ESTIMATE   Cardiac Enzymes: No results  for input(s): CKTOTAL, CKMB, CKMBINDEX, TROPONINI in the last 168  hours. BNP: Invalid input(s): POCBNP CBG: No results for input(s): GLUCAP in the last 168 hours. D-Dimer No results for input(s): DDIMER in the last 72 hours. Hgb A1c No results for input(s): HGBA1C in the last 72 hours. Lipid Profile No results for input(s): CHOL, HDL, LDLCALC, TRIG, CHOLHDL, LDLDIRECT in the last 72 hours. Thyroid function studies No results for input(s): TSH, T4TOTAL, T3FREE, THYROIDAB in the last 72 hours.  Invalid input(s): FREET3 Anemia work up No results for input(s): VITAMINB12, FOLATE, FERRITIN, TIBC, IRON, RETICCTPCT in the last 72 hours. Urinalysis    Component Value Date/Time   COLORURINE YELLOW 10/03/2019 2233   APPEARANCEUR CLEAR 10/03/2019 2233   LABSPEC 1.013 10/03/2019 2233   PHURINE 8.0 10/03/2019 2233   GLUCOSEU NEGATIVE 10/03/2019 2233   HGBUR SMALL (A) 10/03/2019 2233   BILIRUBINUR NEGATIVE 10/03/2019 2233   KETONESUR 5 (A) 10/03/2019 2233   PROTEINUR NEGATIVE 10/03/2019 2233   UROBILINOGEN 1.0 08/08/2015 1512   NITRITE NEGATIVE 10/03/2019 2233   LEUKOCYTESUR NEGATIVE 10/03/2019 2233   Sepsis Labs Invalid input(s): PROCALCITONIN,  WBC,  LACTICIDVEN Microbiology Recent Results (from the past 240 hour(s))  SARS CORONAVIRUS 2 (TAT 6-24 HRS) Nasopharyngeal Nasopharyngeal Swab     Status: None   Collection Time: 10/04/19  1:02 AM   Specimen: Nasopharyngeal Swab  Result Value Ref Range Status   SARS Coronavirus 2 NEGATIVE NEGATIVE Final    Comment: (NOTE) SARS-CoV-2 target nucleic acids are NOT DETECTED. The SARS-CoV-2 RNA is generally detectable in upper and lower respiratory specimens during the acute phase of infection. Negative results do not preclude SARS-CoV-2 infection, do not rule out co-infections with other pathogens, and should not be used as the sole basis for treatment or other patient management decisions. Negative results must be combined with clinical observations, patient history, and epidemiological information. The  expected result is Negative. Fact Sheet for Patients: SugarRoll.be Fact Sheet for Healthcare Providers: https://www.woods-mathews.com/ This test is not yet approved or cleared by the [place] FDA and  has been authorized for detection and/or diagnosis of SARS-CoV-2 by FDA under an Emergency Use Authorization (EUA). This EUA will remain  in effect (meaning this test can be used) for the duration of the COVID-19 declaration under Section 56 4(b)(1) of the Act, 21 U.S.C. section 360bbb-3(b)(1), unless the authorization is terminated or revoked sooner. Performed at [facility], [place] [address] [place], [place] [address]   Group A Strep by PCR     Status: Abnormal   Collection Time: 10/04/19  3:03 AM   Specimen: Sterile Swab  Result Value Ref Range Status   Group A Strep by PCR DETECTED (A) NOT DETECTED Final    Comment: Performed at [facility], [place] [address] [place], [place] [address]  Wet prep, genital     Status: Abnormal   Collection Time: 10/04/19  3:19 AM  Result Value Ref Range Status   Yeast Wet Prep HPF POC NONE SEEN NONE SEEN Final   Trich, Wet Prep NONE SEEN NONE SEEN Final   Clue Cells Wet Prep HPF POC NONE SEEN NONE SEEN Final   WBC, Wet Prep HPF POC MANY (A) NONE SEEN Final   Sperm NONE SEEN  Final    Comment: Performed at [facility], [place] [address] [place], [place] [address]  Respiratory Panel by PCR     Status: None   Collection Time: 10/04/19  3:50 AM  Specimen: Nasopharyngeal Swab; Respiratory  Result Value Ref Range Status   Adenovirus NOT DETECTED NOT DETECTED Final   Coronavirus 229E NOT DETECTED NOT DETECTED Final    Comment: (NOTE) The Coronavirus on the Respiratory Panel, DOES NOT test for the novel  Coronavirus (2019 nCoV)    Coronavirus HKU1 NOT DETECTED NOT DETECTED Final   Coronavirus NL63 NOT DETECTED NOT DETECTED Final   Coronavirus OC43 NOT DETECTED NOT DETECTED Final    Metapneumovirus NOT DETECTED NOT DETECTED Final   Rhinovirus / Enterovirus NOT DETECTED NOT DETECTED Final   Influenza A NOT DETECTED NOT DETECTED Final   Influenza B NOT DETECTED NOT DETECTED Final   Parainfluenza Virus 1 NOT DETECTED NOT DETECTED Final   Parainfluenza Virus 2 NOT DETECTED NOT DETECTED Final   Parainfluenza Virus 3 NOT DETECTED NOT DETECTED Final   Parainfluenza Virus 4 NOT DETECTED NOT DETECTED Final   Respiratory Syncytial Virus NOT DETECTED NOT DETECTED Final   Bordetella pertussis NOT DETECTED NOT DETECTED Final   Chlamydophila pneumoniae NOT DETECTED NOT DETECTED Final   Mycoplasma pneumoniae NOT DETECTED NOT DETECTED Final    Comment: Performed at [facility], [place] [address], [place], [place] [address]  Urine culture     Status: None   Collection Time: 10/04/19  5:10 AM   Specimen: Urine, Clean Catch  Result Value Ref Range Status   Specimen Description URINE, CLEAN CATCH  Final   Special Requests NONE  Final   Culture   Final    NO GROWTH Performed at [facility], [place] [address] [place], [place] [address]    Report Status 10/05/2019 FINAL  Final  Culture, blood (routine x 2)     Status: None (Preliminary result)   Collection Time: 10/04/19  4:30 PM   Specimen: BLOOD  Result Value Ref Range Status   Specimen Description BLOOD RIGHT ANTECUBITAL  Final   Special Requests   Final    BOTTLES DRAWN AEROBIC AND ANAEROBIC Blood Culture adequate volume   Culture   Final    NO GROWTH 2 DAYS Performed at [facility], [place] [address], [place], [place] [address]    Report Status PENDING  Incomplete  Culture, blood (routine x 2)     Status: None (Preliminary result)   Collection Time: 10/04/19  4:40 PM   Specimen: BLOOD LEFT HAND  Result Value Ref Range Status   Specimen Description BLOOD LEFT HAND  Final   Special Requests   Final    BOTTLES DRAWN AEROBIC AND ANAEROBIC Blood Culture results may not be optimal due to an inadequate volume of  blood received in culture bottles   Culture   Final    NO GROWTH 2 DAYS Performed at [facility], [place] [address], [place], [place] [address]    Report Status PENDING  Incomplete     Time coordinating discharge: 35 minutes  SIGNED:   Aline August, MD  [facility] 10/07/2019, 10:11 AM

## 2019-10-07 NOTE — Progress Notes (Signed)
Deborah Melendez to be discharged Home per MD order. Discussed prescriptions and follow up appointments with the patient. Prescriptions given to patient; medication list explained in detail. Patient verbalized understanding.   Skin clean, dry and intact without evidence of skin break down, no evidence of skin tears noted. IV catheter discontinued intact. Site without signs and symptoms of complications. Dressing and pressure applied. Pt denies pain at the site currently. No complaints noted.  Patient free of lines, drains, and wounds.  An After Visit Summary (AVS) was printed and given to the patient. Patient escorted via wheelchair, and discharged home via private auto.  Cathlean Marseilles Tamala Julian, MSN, RN, [place], AGCNS

## 2019-10-07 NOTE — Significant Event (Signed)
Rapid Response Event Note  Overview:Called d/t HR-100s, RR-24. Pt's HR normally 80-90s and RR-15-20. Time Called: 0342 Arrival Time: 0350 Event Type: Respiratory, Cardiac  Initial Focused Assessment: Pt laying in bed, asleep, in no distress. Pt will wake up but is very adamant about wanting to be left alone to sleep. Pt did allow lungs to be auscultated. Lungs were clear. T-98.6 (axillary)-pt will not allow any temp from any other site, HR-105(appears to be SR on monitor), BP-95/59, RR-24, SpO2-99% on RA. WBC-12.3 this AM (down from 16.2)  Interventions: D/t pt refusing further tests(EKG, PCXR, rectal temp), [place], NP paged. Orders to continue to monitor pt. Plan of Care (if not transferred): Monitor. Call RRT if further assistance needed. Event Summary: Name of Physician Notified: Kennon Holter, NP at [phone]    at          [place]

## 2019-10-07 NOTE — Plan of Care (Signed)
  Problem: Fluid Volume: Goal: Hemodynamic stability will improve Outcome: Completed/Met   Problem: Clinical Measurements: Goal: Diagnostic test results will improve Outcome: Completed/Met Goal: Signs and symptoms of infection will decrease Outcome: Completed/Met   Problem: Respiratory: Goal: Ability to maintain adequate ventilation will improve Outcome: Completed/Met   Problem: Education: Goal: Knowledge of General Education information will improve Description: Including pain rating scale, medication(s)/side effects and non-pharmacologic comfort measures Outcome: Completed/Met   Problem: Health Behavior/Discharge Planning: Goal: Ability to manage health-related needs will improve Outcome: Completed/Met   Problem: Clinical Measurements: Goal: Ability to maintain clinical measurements within normal limits will improve Outcome: Completed/Met Goal: Will remain free from infection Outcome: Completed/Met Goal: Diagnostic test results will improve Outcome: Completed/Met Goal: Respiratory complications will improve Outcome: Completed/Met Goal: Cardiovascular complication will be avoided Outcome: Completed/Met   Problem: Activity: Goal: Risk for activity intolerance will decrease Outcome: Completed/Met   Problem: Nutrition: Goal: Adequate nutrition will be maintained Outcome: Completed/Met   Problem: Coping: Goal: Level of anxiety will decrease Outcome: Completed/Met   Problem: Elimination: Goal: Will not experience complications related to bowel motility Outcome: Completed/Met Goal: Will not experience complications related to urinary retention Outcome: Completed/Met   Problem: Pain Managment: Goal: General experience of comfort will improve Outcome: Completed/Met   Problem: Safety: Goal: Ability to remain free from injury will improve Outcome: Completed/Met   Problem: Skin Integrity: Goal: Risk for impaired skin integrity will decrease Outcome:  Completed/Met   Problem: Activity: Goal: Will identify at least one activity in which they can participate Outcome: Completed/Met   Problem: Coping: Goal: Ability to identify and develop effective coping behavior will improve Outcome: Completed/Met Goal: Ability to interact with others will improve Outcome: Completed/Met Goal: Demonstration of participation in decision-making regarding own care will improve Outcome: Completed/Met Goal: Ability to use eye contact when communicating with others will improve Outcome: Completed/Met   Problem: Health Behavior/Discharge Planning: Goal: Identification of resources available to assist in meeting health care needs will improve Outcome: Completed/Met   Problem: Self-Concept: Goal: Will verbalize positive feelings about self Outcome: Completed/Met

## 2019-10-09 LAB — CULTURE, BLOOD (ROUTINE X 2)
Culture: NO GROWTH
Culture: NO GROWTH
Special Requests: ADEQUATE

## 2019-10-13 LAB — GC/CHLAMYDIA PROBE AMP (~~LOC~~) NOT AT ARMC
Chlamydia: NEGATIVE
Comment: NEGATIVE
Comment: NORMAL
Neisseria Gonorrhea: NEGATIVE

## 2019-11-09 ENCOUNTER — Encounter: Payer: Self-pay | Admitting: Family Medicine

## 2019-11-09 ENCOUNTER — Ambulatory Visit: Payer: Self-pay | Admitting: Family Medicine

## 2019-11-09 NOTE — Progress Notes (Signed)
Patient did not keep appointment today. She may call to reschedule.  

## 2019-11-19 ENCOUNTER — Other Ambulatory Visit: Payer: Self-pay

## 2019-11-19 ENCOUNTER — Encounter (HOSPITAL_COMMUNITY): Payer: Self-pay | Admitting: Behavioral Health

## 2019-11-19 ENCOUNTER — Emergency Department (HOSPITAL_COMMUNITY)
Admission: EM | Admit: 2019-11-19 | Discharge: 2019-11-20 | Disposition: A | Discharge status: Home / Self Care | Payer: Self-pay | Attending: Emergency Medicine | Admitting: Emergency Medicine

## 2019-11-19 ENCOUNTER — Emergency Department (HOSPITAL_COMMUNITY): Payer: Self-pay

## 2019-11-19 DIAGNOSIS — F17210 Nicotine dependence, cigarettes, uncomplicated: Secondary | ICD-10-CM | POA: Insufficient documentation

## 2019-11-19 DIAGNOSIS — F419 Anxiety disorder, unspecified: Secondary | ICD-10-CM | POA: Insufficient documentation

## 2019-11-19 DIAGNOSIS — F329 Major depressive disorder, single episode, unspecified: Secondary | ICD-10-CM | POA: Insufficient documentation

## 2019-11-19 DIAGNOSIS — R0602 Shortness of breath: Secondary | ICD-10-CM | POA: Insufficient documentation

## 2019-11-19 DIAGNOSIS — R45850 Homicidal ideations: Secondary | ICD-10-CM | POA: Insufficient documentation

## 2019-11-19 DIAGNOSIS — Z79899 Other long term (current) drug therapy: Secondary | ICD-10-CM | POA: Insufficient documentation

## 2019-11-19 DIAGNOSIS — Z046 Encounter for general psychiatric examination, requested by authority: Secondary | ICD-10-CM | POA: Insufficient documentation

## 2019-11-19 DIAGNOSIS — R45851 Suicidal ideations: Secondary | ICD-10-CM | POA: Insufficient documentation

## 2019-11-19 DIAGNOSIS — Z20828 Contact with and (suspected) exposure to other viral communicable diseases: Secondary | ICD-10-CM | POA: Insufficient documentation

## 2019-11-19 DIAGNOSIS — R05 Cough: Secondary | ICD-10-CM | POA: Insufficient documentation

## 2019-11-19 DIAGNOSIS — J45909 Unspecified asthma, uncomplicated: Secondary | ICD-10-CM | POA: Insufficient documentation

## 2019-11-19 LAB — CBC WITH DIFFERENTIAL/PLATELET
Abs Immature Granulocytes: 0.03 10*3/uL (ref 0.00–0.07)
Basophils Absolute: 0 10*3/uL (ref 0.0–0.1)
Basophils Relative: 0 %
Eosinophils Absolute: 0.1 10*3/uL (ref 0.0–0.5)
Eosinophils Relative: 1 %
HCT: 36.6 % (ref 36.0–46.0)
Hemoglobin: 11.1 g/dL — ABNORMAL LOW (ref 12.0–15.0)
Immature Granulocytes: 0 %
Lymphocytes Relative: 6 %
Lymphs Abs: 0.5 10*3/uL — ABNORMAL LOW (ref 0.7–4.0)
MCH: 26.4 pg (ref 26.0–34.0)
MCHC: 30.3 g/dL (ref 30.0–36.0)
MCV: 86.9 fL (ref 80.0–100.0)
Monocytes Absolute: 0.1 10*3/uL (ref 0.1–1.0)
Monocytes Relative: 1 %
Neutro Abs: 7 10*3/uL (ref 1.7–7.7)
Neutrophils Relative %: 92 %
Platelets: 334 10*3/uL (ref 150–400)
RBC: 4.21 MIL/uL (ref 3.87–5.11)
RDW: 28.1 % — ABNORMAL HIGH (ref 11.5–15.5)
WBC: 7.8 10*3/uL (ref 4.0–10.5)
nRBC: 0 % (ref 0.0–0.2)

## 2019-11-19 LAB — COMPREHENSIVE METABOLIC PANEL
ALT: 15 U/L (ref 0–44)
AST: 25 U/L (ref 15–41)
Albumin: 3.8 g/dL (ref 3.5–5.0)
Alkaline Phosphatase: 53 U/L (ref 38–126)
Anion gap: 10 (ref 5–15)
BUN: 9 mg/dL (ref 6–20)
CO2: 19 mmol/L — ABNORMAL LOW (ref 22–32)
Calcium: 8.7 mg/dL — ABNORMAL LOW (ref 8.9–10.3)
Chloride: 111 mmol/L (ref 98–111)
Creatinine, Ser: 0.93 mg/dL (ref 0.44–1.00)
GFR calc Af Amer: >60 mL/min (ref >60)
GFR calc non Af Amer: >60 mL/min (ref >60)
Glucose, Bld: 142 mg/dL — ABNORMAL HIGH (ref 70–99)
Potassium: 3.7 mmol/L (ref 3.5–5.1)
Sodium: 140 mmol/L (ref 135–145)
Total Bilirubin: 0.4 mg/dL (ref 0.3–1.2)
Total Protein: 7.4 g/dL (ref 6.5–8.1)

## 2019-11-19 LAB — ETHANOL: Alcohol, Ethyl (B): <10 mg/dL (ref <10)

## 2019-11-19 LAB — RAPID URINE DRUG SCREEN, HOSP PERFORMED
Amphetamines: NOT DETECTED
Barbiturates: NOT DETECTED
Benzodiazepines: NOT DETECTED
Cocaine: POSITIVE — AB
Opiates: NOT DETECTED
Tetrahydrocannabinol: POSITIVE — AB

## 2019-11-19 LAB — I-STAT BETA HCG BLOOD, ED (MC, WL, AP ONLY): I-stat hCG, quantitative: <5 m[IU]/mL (ref <5)

## 2019-11-19 LAB — POC SARS CORONAVIRUS 2 AG -  ED: SARS Coronavirus 2 Ag: NEGATIVE

## 2019-11-19 MED ORDER — FLUTICASONE FUROATE-VILANTEROL 100-25 MCG/INH IN AEPB
1.0000 | INHALATION_SPRAY | Freq: Every day | RESPIRATORY_TRACT | Status: DC
  Filled 2019-11-19: qty 28

## 2019-11-19 MED ORDER — GABAPENTIN 300 MG PO CAPS
300.0000 mg | ORAL_CAPSULE | Freq: Three times a day (TID) | ORAL | Status: DC
  Administered 2019-11-19 (×2): 300 mg via ORAL
  Filled 2019-11-19 (×2): qty 1

## 2019-11-19 MED ORDER — QUETIAPINE FUMARATE 25 MG PO TABS
25.0000 mg | ORAL_TABLET | Freq: Every day | ORAL | Status: DC
  Administered 2019-11-19: 25 mg via ORAL
  Filled 2019-11-19: qty 1

## 2019-11-19 MED ORDER — BENZONATATE 100 MG PO CAPS
100.0000 mg | ORAL_CAPSULE | Freq: Two times a day (BID) | ORAL | Status: DC | PRN
  Administered 2019-11-19: 100 mg via ORAL
  Filled 2019-11-19: qty 1

## 2019-11-19 MED ORDER — ALBUTEROL SULFATE HFA 108 (90 BASE) MCG/ACT IN AERS
2.0000 | INHALATION_SPRAY | Freq: Four times a day (QID) | RESPIRATORY_TRACT | Status: DC | PRN
  Filled 2019-11-19: qty 6.7

## 2019-11-19 MED ORDER — LORATADINE 10 MG PO TABS
10.0000 mg | ORAL_TABLET | Freq: Every day | ORAL | Status: DC
  Administered 2019-11-19: 10 mg via ORAL
  Filled 2019-11-19: qty 1

## 2019-11-19 MED ORDER — PANTOPRAZOLE SODIUM 40 MG PO TBEC
40.0000 mg | DELAYED_RELEASE_TABLET | Freq: Every day | ORAL | Status: DC
  Administered 2019-11-19: 40 mg via ORAL
  Filled 2019-11-19: qty 1

## 2019-11-19 MED ORDER — FERROUS SULFATE 325 (65 FE) MG PO TABS: 325.0000 mg | ORAL_TABLET | Freq: Every day | ORAL | Status: DC

## 2019-11-19 MED ORDER — TRAZODONE HCL 50 MG PO TABS
100.0000 mg | ORAL_TABLET | Freq: Every day | ORAL | Status: DC
  Administered 2019-11-19: 23:00:00 100 mg via ORAL
  Filled 2019-11-19: qty 2

## 2019-11-19 NOTE — ED Triage Notes (Signed)
Patient arrives via EMS due to c/o of respiratory distress and Suicidal/Homicidal ideations. Per ems, patient has been having ongoing arguments with her boyfriend over the past 3 days. Today they had a heated argument that sent her into respiratory distress. The patient became suicidal and homicidal, wanting to kill her boyfriend as herself. Called EMS for help.   Patient received 5mg  Albulterol, 125 Solumedrol, and .5 atrovent. 20g. IV in place R. Hand.   Patient also stopped taking her anxiety/psych medications a few weeks due to feeling like she was getting better.    Deborah Melendez 11/19/2019, 1:50 PM

## 2019-11-19 NOTE — ED Provider Notes (Signed)
[facility] EMERGENCY DEPARTMENT Provider Note   CSN: [id number] Arrival date & time: 11/19/19  1339     History   Chief Complaint Chief Complaint  Patient presents with  . Suicidal  . Homicidal  . Shortness of Breath    HPI Deborah Melendez is a 43 y.o. female with history of asthma, bipolar disorder, polysubstance abuse who presents with homicidal and suicidal ideation and shortness of breath.  She states that she has been getting in arguments with her boyfriend over the past 3 days because he has been cheating on her.  Today he got an argument and she states that it caused her asthma to flareup.  She had acute onset of shortness of breath, wheezing, coughing and had to call EMS.  She states that she does not know whether she wants to kill her boyfriend or kill herself.  She states that they live together and does not feel like she can go back.  She states that her shortness of breath and wheezing have resolved after EMS gave her medicines.  She stopped taking her psychiatric medicines several months ago because she was feeling better.  She previously went to [facility].  Denies fever, chills, chest pain, current shortness of breath, abdominal pain, nausea vomiting or diarrhea.  Patient states that she drank a lot of alcohol last night and continues to use cocaine.     HPI  Past Medical History:  Diagnosis Date  . Asthma   . Bipolar 1 disorder, depressed, severe ([place]) 02/04/2017  . Cocaine abuse with cocaine-induced mood disorder ([place]) 07/27/2017  . COPD (chronic obstructive pulmonary disease) ([place])   . Depression   . Homicidal ideations   . Manic behavior ([place])   . 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 Substance or medication-induced bipolar and related disorder with onset during intoxication ([place]) 09/08/2016  . Suicidal ideation     Patient Active Problem List   Diagnosis Date Noted  . SIRS (systemic  inflammatory response syndrome) ([place]) 10/04/2019  . Acute blood loss anemia 10/04/2019  . Symptomatic anemia 04/18/2019  . SOB (shortness of breath) 01/18/2019  . Acute asthma exacerbation 01/18/2019  . Asthma, chronic 12/21/2018  . Bipolar I disorder, most recent episode depressed ([place]) 12/21/2018  . Altered mental status   . Acute respiratory failure with hypoxia ([place]) 10/14/2018  . Bronchitis 10/05/2018  . Acute bronchitis with asthma with acute exacerbation 10/04/2018  . Tobacco use disorder 05/22/2017  . Suicidal ideation   . Cannabis use disorder, severe, dependence ([place]) 01/20/2017  . Cocaine use disorder, severe, dependence ([place]) 08/08/2015    Past Surgical History:  Procedure Laterality Date  . FINGER SURGERY       OB History    Gravida  6   Para  4   Term      Preterm      AB      Living  4     SAB      TAB      Ectopic      Multiple      Live Births  0            Home Medications    Prior to Admission medications   Medication Sig Start Date End Date Taking? Authorizing Provider  albuterol (VENTOLIN HFA) 108 (90 Base) MCG/ACT inhaler Inhale 2 puffs into the lungs every 6 (six) hours as needed for wheezing or shortness of breath. Patient not taking: Reported  on 10/04/2019 04/21/19   Isabelle Course, MD  benzonatate (TESSALON) 100 MG capsule Take 1 capsule (100 mg total) by mouth 2 (two) times daily as needed for cough. 10/07/19   Aline August, MD  ferrous sulfate 325 (65 FE) MG tablet Take 1 tablet (325 mg total) by mouth daily with breakfast. Patient not taking: Reported on 10/04/2019 04/21/19   Isabelle Course, MD  Fluticasone-Salmeterol (ADVAIR) 100-50 MCG/DOSE AEPB Inhale 1 puff into the lungs 2 (two) times daily. Patient not taking: Reported on 10/04/2019 08/18/19   Gildardo Pounds, NP  gabapentin (NEURONTIN) 300 MG capsule Take 1 capsule (300 mg total) by mouth 3 (three) times daily. 10/07/19   Aline August, MD  ipratropium-albuterol  (DUONEB) 0.5-2.5 (3) MG/3ML SOLN Take 3 mLs by nebulization every 4 (four) hours as needed. Patient not taking: Reported on 10/04/2019 08/18/19   Gildardo Pounds, NP  loratadine (CLARITIN) 10 MG tablet Take 1 tablet (10 mg total) by mouth daily. Patient not taking: Reported on 10/04/2019 08/18/19   Gildardo Pounds, NP  megestrol (MEGACE) 40 MG tablet Take 1 tablet (40 mg total) by mouth daily. 10/07/19   Aline August, MD  ondansetron (ZOFRAN) 4 MG tablet Take 1 tablet (4 mg total) by mouth every 6 (six) hours as needed for nausea. 10/07/19   Aline August, MD  oxyCODONE (ROXICODONE) 5 MG immediate release tablet Take 1 tablet (5 mg total) by mouth every 6 (six) hours as needed for moderate pain. 10/07/19 10/06/20  Aline August, MD  pantoprazole (PROTONIX) 40 MG tablet Take 1 tablet (40 mg total) by mouth daily. 10/07/19   Aline August, MD  QUEtiapine (SEROQUEL) 25 MG tablet Take 1 tablet (25 mg total) by mouth at bedtime. 10/07/19   Aline August, MD  traZODone (DESYREL) 100 MG tablet Take 1 tablet (100 mg total) by mouth at bedtime. 10/07/19   Aline August, MD    Family History Family History  Problem Relation Age of Onset  . Diabetes Mother   . Hypertension Mother   . Drug abuse Father   . Schizophrenia Maternal Aunt     Social History Social History   Tobacco Use  . Smoking status: Current Every Day Smoker    Packs/day: 1.00    Types: Cigarettes  . Smokeless tobacco: Never Used  Substance Use Topics  . Alcohol use: Yes    Comment: pt denies drinking at this time  . Drug use: Yes    Frequency: 1.0 times per week    Types: Cocaine, Marijuana    Comment:  last used cocaine today     Allergies   Patient has no known allergies.   Review of Systems Review of Systems  Constitutional: Negative for chills and fever.  Respiratory: Positive for cough (chronic), shortness of breath (resolved) and wheezing.   Cardiovascular: Negative for chest pain.  Gastrointestinal:  Negative for abdominal pain.  Psychiatric/Behavioral: Positive for dysphoric mood and suicidal ideas. Negative for self-injury.  All other systems reviewed and are negative.    Physical Exam Updated Vital Signs BP 130/88 (BP Location: Right Arm)   Pulse 94   Temp 98.7 F (37.1 C) (Oral)   Resp 16   Ht 5\' 3"  (1.6 m)   Wt 65.8 kg   SpO2 100%   BMI 25.69 kg/m   Physical Exam Vitals signs and nursing note reviewed.  Constitutional:      General: She is not in acute distress.    Appearance: She is well-developed. She  is not ill-appearing.     Comments: Disheveled, cooperative  HENT:     Head: Normocephalic and atraumatic.  Eyes:     General: No scleral icterus.       Right eye: No discharge.        Left eye: No discharge.     Conjunctiva/sclera: Conjunctivae normal.     Pupils: Pupils are equal, round, and reactive to light.  Neck:     Musculoskeletal: Normal range of motion.  Cardiovascular:     Rate and Rhythm: Normal rate and regular rhythm.  Pulmonary:     Effort: Pulmonary effort is normal. No respiratory distress.     Breath sounds: Wheezing (slight wheezing) present.  Abdominal:     General: There is no distension.     Palpations: Abdomen is soft.     Tenderness: There is no abdominal tenderness.  Skin:    General: Skin is warm and dry.  Neurological:     Mental Status: She is alert and oriented to person, place, and time.  Psychiatric:        Attention and Perception: Attention normal.        Mood and Affect: Mood is anxious.        Speech: Speech normal.        Behavior: Behavior normal. Behavior is cooperative.        Thought Content: Thought content includes homicidal and suicidal ideation. Thought content includes homicidal and suicidal plan.        Cognition and Memory: Cognition normal.        Judgment: Judgment normal.      ED Treatments / Results  Labs (all labs ordered are listed, but only abnormal results are displayed) Labs Reviewed   COMPREHENSIVE METABOLIC PANEL - Abnormal; Notable for the following components:      Result Value   CO2 19 (*)    Glucose, Bld 142 (*)    Calcium 8.7 (*)    All other components within normal limits  RAPID URINE DRUG SCREEN, HOSP PERFORMED - Abnormal; Notable for the following components:   Cocaine POSITIVE (*)    Tetrahydrocannabinol POSITIVE (*)    All other components within normal limits  CBC WITH DIFFERENTIAL/PLATELET - Abnormal; Notable for the following components:   Hemoglobin 11.1 (*)    RDW 28.1 (*)    Lymphs Abs 0.5 (*)    All other components within normal limits  ETHANOL  I-STAT BETA HCG BLOOD, ED ([facility], [facility], [facility] ONLY)  POC SARS CORONAVIRUS 2 AG -  ED    EKG None  Radiology Dg Chest Port 1 View  Result Date: 11/19/2019 CLINICAL DATA:  Respiratory distress, shortness of breath EXAM: PORTABLE CHEST 1 VIEW COMPARISON:  10/03/2019 FINDINGS: The heart size and mediastinal contours are within normal limits. Both lungs are clear. The visualized skeletal structures are unremarkable. Trachea midline. Azygos fissure in the right upper lobe, normal variant. No effusion or pneumothorax. IMPRESSION: No active disease. Electronically Signed   By: Jerilynn Mages.  Shick M.D.   On: 11/19/2019 14:42    Procedures Procedures (including critical care time)  Medications Ordered in ED Medications  albuterol (VENTOLIN HFA) 108 (90 Base) MCG/ACT inhaler 2 puff (has no administration in time range)  benzonatate (TESSALON) capsule 100 mg (100 mg Oral Given 11/19/19 1446)  ferrous sulfate tablet 325 mg (has no administration in time range)  fluticasone furoate-vilanterol (BREO ELLIPTA) 100-25 MCG/INH 1 puff (1 puff Inhalation Not Given 11/19/19 1832)  gabapentin (NEURONTIN) capsule 300 mg (  300 mg Oral Given 11/19/19 1450)  loratadine (CLARITIN) tablet 10 mg (10 mg Oral Given 11/19/19 1446)  pantoprazole (PROTONIX) EC tablet 40 mg (40 mg Oral Given 11/19/19 1446)  QUEtiapine (SEROQUEL) tablet 25 mg (has  no administration in time range)  traZODone (DESYREL) tablet 100 mg (has no administration in time range)     Initial Impression / Assessment and Plan / ED Course  I have reviewed the triage vital signs and the nursing notes.  Pertinent labs & imaging results that were available during my care of the patient were reviewed by me and considered in my medical decision making (see chart for details).  43 year old female presents with SI/HI after an argument with her significant other today. She states it started an asthma attack. EMS gave tx for asthma and has resolved her symptoms on arrival. She is expressing active SI or HI but does not have a specific plan. Her exam is normal. Vitals are reassuring. Will obtain medical clearance labs  Labs are normal. ETOH normal. UDS positive for cocaine and THC. Pt medically cleared. Home meds ordered.  Pt will be obs overnight. POC COVID is negative. Will have to order rapid COVID if patient is recommended to be inpatient.  Final Clinical Impressions(s) / ED Diagnoses   Final diagnoses:  SOB (shortness of breath)  Suicidal ideation  Homicidal ideation    ED Discharge Orders    None       Recardo Evangelist, PA-C 11/19/19 2104    Milton Ferguson, MD 11/20/19 [phone]

## 2019-11-19 NOTE — ED Notes (Signed)
Py aware that urine sample is needed.

## 2019-11-19 NOTE — ED Notes (Signed)
[place] to advise if will have [facility] Obs Unit bed this evening.
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
## 2019-11-20 ENCOUNTER — Other Ambulatory Visit: Payer: Self-pay

## 2019-11-20 ENCOUNTER — Encounter (HOSPITAL_COMMUNITY): Payer: Self-pay

## 2019-11-20 ENCOUNTER — Emergency Department (HOSPITAL_COMMUNITY)
Admission: EM | Admit: 2019-11-20 | Discharge: 2019-11-21 | Disposition: A | Discharge status: Home / Self Care | Payer: Self-pay | Attending: Emergency Medicine | Admitting: Emergency Medicine

## 2019-11-20 DIAGNOSIS — J449 Chronic obstructive pulmonary disease, unspecified: Secondary | ICD-10-CM | POA: Insufficient documentation

## 2019-11-20 DIAGNOSIS — F4323 Adjustment disorder with mixed anxiety and depressed mood: Secondary | ICD-10-CM

## 2019-11-20 DIAGNOSIS — R05 Cough: Secondary | ICD-10-CM | POA: Insufficient documentation

## 2019-11-20 DIAGNOSIS — R45850 Homicidal ideations: Secondary | ICD-10-CM | POA: Insufficient documentation

## 2019-11-20 DIAGNOSIS — R45851 Suicidal ideations: Secondary | ICD-10-CM

## 2019-11-20 DIAGNOSIS — Z590 Homelessness: Secondary | ICD-10-CM | POA: Insufficient documentation

## 2019-11-20 DIAGNOSIS — Z046 Encounter for general psychiatric examination, requested by authority: Secondary | ICD-10-CM | POA: Insufficient documentation

## 2019-11-20 DIAGNOSIS — F329 Major depressive disorder, single episode, unspecified: Secondary | ICD-10-CM | POA: Insufficient documentation

## 2019-11-20 DIAGNOSIS — R454 Irritability and anger: Secondary | ICD-10-CM | POA: Insufficient documentation

## 2019-11-20 DIAGNOSIS — F17210 Nicotine dependence, cigarettes, uncomplicated: Secondary | ICD-10-CM | POA: Insufficient documentation

## 2019-11-20 DIAGNOSIS — F1220 Cannabis dependence, uncomplicated: Secondary | ICD-10-CM | POA: Diagnosis present

## 2019-11-20 DIAGNOSIS — F3130 Bipolar disorder, current episode depressed, mild or moderate severity, unspecified: Secondary | ICD-10-CM | POA: Diagnosis present

## 2019-11-20 DIAGNOSIS — R062 Wheezing: Secondary | ICD-10-CM | POA: Insufficient documentation

## 2019-11-20 LAB — COMPREHENSIVE METABOLIC PANEL
ALT: 13 U/L (ref 0–44)
AST: 17 U/L (ref 15–41)
Albumin: 3.7 g/dL (ref 3.5–5.0)
Alkaline Phosphatase: 47 U/L (ref 38–126)
Anion gap: 8 (ref 5–15)
BUN: 10 mg/dL (ref 6–20)
CO2: 22 mmol/L (ref 22–32)
Calcium: 8.6 mg/dL — ABNORMAL LOW (ref 8.9–10.3)
Chloride: 110 mmol/L (ref 98–111)
Creatinine, Ser: 1 mg/dL (ref 0.44–1.00)
GFR calc Af Amer: >60 mL/min (ref >60)
GFR calc non Af Amer: >60 mL/min (ref >60)
Glucose, Bld: 99 mg/dL (ref 70–99)
Potassium: 3.6 mmol/L (ref 3.5–5.1)
Sodium: 140 mmol/L (ref 135–145)
Total Bilirubin: 0.6 mg/dL (ref 0.3–1.2)
Total Protein: 7 g/dL (ref 6.5–8.1)

## 2019-11-20 LAB — RAPID URINE DRUG SCREEN, HOSP PERFORMED
Amphetamines: NOT DETECTED
Barbiturates: NOT DETECTED
Benzodiazepines: NOT DETECTED
Cocaine: POSITIVE — AB
Opiates: NOT DETECTED
Tetrahydrocannabinol: POSITIVE — AB

## 2019-11-20 LAB — CBC
HCT: 34.6 % — ABNORMAL LOW (ref 36.0–46.0)
Hemoglobin: 10.2 g/dL — ABNORMAL LOW (ref 12.0–15.0)
MCH: 26.2 pg (ref 26.0–34.0)
MCHC: 29.5 g/dL — ABNORMAL LOW (ref 30.0–36.0)
MCV: 88.7 fL (ref 80.0–100.0)
Platelets: 368 10*3/uL (ref 150–400)
RBC: 3.9 MIL/uL (ref 3.87–5.11)
RDW: 28.3 % — ABNORMAL HIGH (ref 11.5–15.5)
WBC: 8.8 10*3/uL (ref 4.0–10.5)
nRBC: 0 % (ref 0.0–0.2)

## 2019-11-20 LAB — I-STAT BETA HCG BLOOD, ED (MC, WL, AP ONLY): I-stat hCG, quantitative: <5 m[IU]/mL (ref <5)

## 2019-11-20 LAB — ACETAMINOPHEN LEVEL: Acetaminophen (Tylenol), Serum: <10 ug/mL — ABNORMAL LOW (ref 10–30)

## 2019-11-20 LAB — SALICYLATE LEVEL: Salicylate Lvl: <7 mg/dL (ref 2.8–30.0)

## 2019-11-20 LAB — ETHANOL: Alcohol, Ethyl (B): <10 mg/dL (ref <10)

## 2019-11-20 LAB — SARS CORONAVIRUS 2 BY RT PCR (HOSPITAL ORDER, PERFORMED IN ~~LOC~~ HOSPITAL LAB): SARS Coronavirus 2: NEGATIVE

## 2019-11-20 NOTE — ED Notes (Signed)
Called TTS, notify of TTS

## 2019-11-20 NOTE — BH Assessment (Signed)
Reassessment Note:  Pt presenting to [facility] a couple hours after [facility] discharge. She reports she feels "lousy and is disgusted" with herself. She reports she has been self-mutilating by picking at her skin. Pt has marks all over her face and body. She continued to touch/pick at them during assessment. Pt reports crack-cocaine use.   Pt reports feeling depressed with suicidal plan to stab herself. She states she has "about 3" past suicide attempts. Pt states she is homicidal toward her boyfriend & that she would stab the both of them. Pt reports no current legal charges or probation.   Pt states she thinks she has auditory hallucinations, voices that tell her to go and use drugs. Pt has been to [facility] in the past for med mngt and outpt tx. She reports she hasn't been there in a long while & has been off of meds for about 7 months. Pt states she would like medicine for "anxiety and paranoia". She states trazadone and gabapentin have helped in the past.   Pt reports she is not homeless, she lives with her boyfriend.

## 2019-11-20 NOTE — Progress Notes (Signed)
Received Deborah Melendez at the change of shift asleep in her bed without any acute distress. She woke up and  received a snack of her choice. She endorsed feeling depressed and suicidal. She feels safe here in the hospital. She slept throughout the night without incident.

## 2019-11-20 NOTE — Progress Notes (Signed)
Patient ID: Deborah Melendez, female   DOB: January 10, 1976, 43 y.o.   MRN: [id number]   Reassessment   Deborah Melendez is a 43 y.o. female who presented to [facility] on voluntary basis with complaint of cocaine use, marijuana use, untreated Bipolar I disorder, homicidal ideation, and suicidal ideation.    During this evaluation, patient is alert and oriented x4, cooperative but very irritable. She reports she is in the ED because she had an asthma flare up after having a verbal altercation with her boyfriend. She reports she has been having mood swings and states she argued with her boyfriend yesterday which tipped her over the edge. Reports three days ago, she was looking for her boyfriend to harm him. She continues to endorse homicidal thoughts towards her boyfriend stating," he just brought my self esteem down." although denies plan or intent. She denies any access to firearms. She denies suicidal thoughts. Pt was positive for cocaine and THC and reports for the past several days, she has been on a cocaine binge. Reports her last use of cocaine was the day she went to the ED. She denies auditory/visual hallucination, paranoia or other psychosis. She denies recent self-harming behaviors but admits that's he will scratch and pick at her skin. She has had multiple inpatient psychiatric admissions per chart review although reports she has not followed up with [facility] which she was going to in the past, not been complaint with medications, and not followed up with other outpatient resources provided.   I have discussed case with [facility] team including Dr. Dwyane Dee this morning. We have agreed that patient is to be psychiatrically cleared. LCSW from [facility]  will  provide community resources that include substance abuse, and outpatient therapy and psychiatry.   EDP Dr. Rex Kras updated on current disposition.

## 2019-11-20 NOTE — Patient Outreach (Signed)
CPSS went an made several attempts to wake Pt up. CPSS was unable to meet with Pt at this time, will try to visit Pt again later.

## 2019-11-20 NOTE — Progress Notes (Signed)
Pt psychiatrically cleared. CSW faxed housing, substance abuse and mental health resources to [facility] ED for pt.   Audree Camel, LCSW, [place] Disposition [place] [facility]/TTS [phone]

## 2019-11-20 NOTE — ED Notes (Signed)
Sitter at bedside.

## 2019-11-20 NOTE — ED Notes (Signed)
Pt to room 41. Pt oriented to unit. Pt irritable. Pt laying down comfortably. Lunch ordered for pt.

## 2019-11-20 NOTE — ED Notes (Signed)
Discharge instructions reviewed.  followup reviewed.  Patient has no further questions.
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
## 2019-11-20 NOTE — ED Notes (Signed)
Patient asked to sign belongings form.  She refused to sign form.

## 2019-11-20 NOTE — ED Notes (Signed)
Breakfast Ordered 

## 2019-11-20 NOTE — ED Triage Notes (Addendum)
Patient states, "I can not afford to be alone today. I want to die. I want to stab myself. I want to kill my boyfriend. I feel like a piece of trash." Patient states she last had alcohol and cocaine 2 days ago.  Patient repeatedly stated, "I want to die. I want to leave my little girl alone. I am a worthless piece of trash. I do not want to wake up to another morning."

## 2019-11-20 NOTE — ED Notes (Signed)
Tele psych monitor at bedside for assessment.

## 2019-11-20 NOTE — ED Provider Notes (Signed)
Patient observed overnight pending repeat psychiatric evaluation in the morning.  I discussed with NP Mordecai Maes who reports that they have recommended discharge with outpatient resources.   Little, Wenda Overland, MD 11/20/19 [phone]

## 2019-11-21 ENCOUNTER — Encounter (HOSPITAL_COMMUNITY): Payer: Self-pay | Admitting: Registered Nurse

## 2019-11-21 DIAGNOSIS — F4323 Adjustment disorder with mixed anxiety and depressed mood: Secondary | ICD-10-CM

## 2019-11-21 DIAGNOSIS — R451 Restlessness and agitation: Secondary | ICD-10-CM

## 2019-11-21 DIAGNOSIS — F17210 Nicotine dependence, cigarettes, uncomplicated: Secondary | ICD-10-CM

## 2019-11-21 MED ORDER — IPRATROPIUM-ALBUTEROL 0.5-2.5 (3) MG/3ML IN SOLN: 3.0000 mL | RESPIRATORY_TRACT | Status: DC | PRN

## 2019-11-21 MED ORDER — TRAZODONE HCL 100 MG PO TABS: 100.0000 mg | ORAL_TABLET | Freq: Every day | ORAL | Status: DC

## 2019-11-21 MED ORDER — PANTOPRAZOLE SODIUM 40 MG PO TBEC
40.0000 mg | DELAYED_RELEASE_TABLET | Freq: Every day | ORAL | Status: DC
  Administered 2019-11-21: 40 mg via ORAL
  Filled 2019-11-21: qty 1

## 2019-11-21 MED ORDER — LORATADINE 10 MG PO TABS
10.0000 mg | ORAL_TABLET | Freq: Every day | ORAL | Status: DC
  Administered 2019-11-21: 10 mg via ORAL
  Filled 2019-11-21: qty 1

## 2019-11-21 MED ORDER — QUETIAPINE FUMARATE 25 MG PO TABS: 25.0000 mg | ORAL_TABLET | Freq: Every day | ORAL | Status: DC

## 2019-11-21 MED ORDER — MOMETASONE FURO-FORMOTEROL FUM 100-5 MCG/ACT IN AERO
2.0000 | INHALATION_SPRAY | Freq: Two times a day (BID) | RESPIRATORY_TRACT | Status: DC
  Administered 2019-11-21: 2 via RESPIRATORY_TRACT
  Filled 2019-11-21: qty 8.8

## 2019-11-21 MED ORDER — FERROUS SULFATE 325 (65 FE) MG PO TABS
325.0000 mg | ORAL_TABLET | Freq: Every day | ORAL | Status: DC
  Administered 2019-11-21: 325 mg via ORAL
  Filled 2019-11-21 (×2): qty 1

## 2019-11-21 MED ORDER — GABAPENTIN 300 MG PO CAPS
300.0000 mg | ORAL_CAPSULE | Freq: Three times a day (TID) | ORAL | Status: DC
  Administered 2019-11-21 (×2): 300 mg via ORAL
  Filled 2019-11-21 (×2): qty 1

## 2019-11-21 MED ORDER — ALBUTEROL SULFATE HFA 108 (90 BASE) MCG/ACT IN AERS
2.0000 | INHALATION_SPRAY | Freq: Four times a day (QID) | RESPIRATORY_TRACT | Status: DC | PRN
  Administered 2019-11-21: 2 via RESPIRATORY_TRACT
  Filled 2019-11-21: qty 6.7

## 2019-11-21 MED ORDER — MEGESTROL ACETATE 40 MG PO TABS
40.0000 mg | ORAL_TABLET | Freq: Every day | ORAL | Status: DC
  Administered 2019-11-21: 40 mg via ORAL
  Filled 2019-11-21: qty 1

## 2019-11-21 NOTE — ED Notes (Signed)
Pt w/ bilateral wheezing and increased cough

## 2019-11-21 NOTE — ED Notes (Signed)
Peer support into see 

## 2019-11-21 NOTE — Discharge Instructions (Addendum)
For your mental health needs, you are advised to follow up with [facility].  Call them at your earliest opportunity to schedule an intake appointment:       [place]. [address]      [place], [place] [address]      [phone]      Crisis number: [phone]   [facility] [phone]

## 2019-11-21 NOTE — BH Assessment (Signed)
[facility] Assessment Progress Note  Per Shuvon Rankin, FNP, this pt does not require psychiatric hospitalization at this time.  Pt is to be discharged from [facility] with recommendation to follow up with [facility]  This has been included in pt's discharge instructions.  Pt's nurse, Narda Rutherford, has been notified.  Jalene Mullet, [place] Triage Specialist [phone]

## 2019-11-21 NOTE — ED Notes (Addendum)
Pt ambulatory w/o difficulty to dc area w/ nt, belongings returned after leaving the area.

## 2019-11-21 NOTE — ED Notes (Addendum)
Ok to give inhaler(Dulera/albuterol) to pt to take home VORB Dr Derwood Kaplan

## 2019-11-21 NOTE — ED Notes (Addendum)
Breath sounds improved w/ scatter wheezing lower fields, pt reports feeling better and wants to leave now.  Pt declined to take OP referals that had been provided by CSW , to take her AVS or to sign for her belongings.  Pt given sweater from clothes closet.Marland Kitchen

## 2019-11-21 NOTE — ED Notes (Signed)
Pt declined to review or take written dc instrucitons.

## 2019-11-21 NOTE — ED Notes (Signed)
On the phone 
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
## 2019-11-21 NOTE — ED Notes (Signed)
Up to the bathroom 

## 2019-11-21 NOTE — ED Notes (Signed)
telepsych eval with dr Dwyane Dee
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
## 2019-11-21 NOTE — ED Notes (Addendum)
Pt sleeping, easily aroused.  Pt reports that she is for suicidal thoughts and has "several"plans and will not specify.  Pt denies hi/avh at this time, but reports that she's more depressed now.  Pt reports that she needs to get back on her medications.

## 2019-11-21 NOTE — ED Notes (Signed)
Pt sleeping soundly, easily aroused.  Pt is aware that she is going to be dc'd

## 2019-11-21 NOTE — Patient Outreach (Signed)
ED Peer Support Specialist Patient Intake (Complete at intake & 30-60 Day Follow-up)  Name: Deborah Melendez  MRN: [id number]  Age: 43 y.o.   Date of Admission: 11/21/2019  Intake: Initial Comments:      Primary Reason Admitted: Suicidal ideation   Lab values: Alcohol/ETOH: Negative Positive UDS? Yes Amphetamines: No Barbiturates: No Benzodiazepines: No Cocaine: Yes Opiates: No Cannabinoids: Yes  Demographic information: Gender: Female Ethnicity: African American Marital Status: Single Insurance Status: Uninsured/Self-pay Ecologist (Work Neurosurgeon, Physicist, medical, etc.: No Lives with: Partner/Spouse Living situation: Homeless  Reported Patient History: Patient reported health conditions: None Patient aware of HIV and hepatitis status: No  In past year, has patient visited ED for any reason? No  Number of ED visits:    Reason(s) for visit:    In past year, has patient been hospitalized for any reason? No  Number of hospitalizations:    Reason(s) for hospitalization:    In past year, has patient been arrested? No  Number of arrests:    Reason(s) for arrest:    In past year, has patient been incarcerated?    Number of incarcerations:    Reason(s) for incarceration:    In past year, has patient received medication-assisted treatment? No  In past year, patient received the following treatments: Other (comment)([facility])  In past year, has patient received any harm reduction services? No  Did this include any of the following?    In past year, has patient received care from a mental health provider for diagnosis other than SUD? No  In past year, is this first time patient has overdosed? No  Number of past overdoses:    In past year, is this first time patient has been hospitalized for an overdose? No  Number of hospitalizations for overdose(s):    Is patient currently receiving treatment for a mental health  diagnosis? No  Patient reports experiencing difficulty participating in SUD treatment: No    Most important reason(s) for this difficulty?    Has patient received prior services for treatment? No  In past, patient has received services from following agencies:    Plan of Care:  Suggested follow up at these agencies/treatment centers: Other (comment)(Stated that she wants to get home to [place])  Other information: CPSS met with Pt and was able to complete series of questions. CPSS process with Pt about creating a plan for Pt for when she discharged an how CPSS can help Pt with resources for Substance Abuse. Pt stated that she does not have a place to go once Pt is discharged. CPSS was asked to contact the ex boyfriend, which it was made clearly to have her not contact him anymore an to leave him be. CPSS contacted Social Work so that she can assist Pt with getting Pt back with her family. CPSS left a list of resources for Pt to look over and possible get set up for treatment.CPSS left contact information for Pt to contact CPSS if she need community support.    Aaron Edelman Zyrus Hetland, [place]  11/21/2019 12:11 PM

## 2019-12-26 IMAGING — DX DG CHEST 1V PORT
1 series · 1 of 1 positions shown · non-contrast
Comparison: Film from earlier in the same day.

CLINICAL DATA: Check endotracheal tube placement

EXAM:
PORTABLE CHEST 1 VIEW

[chest]
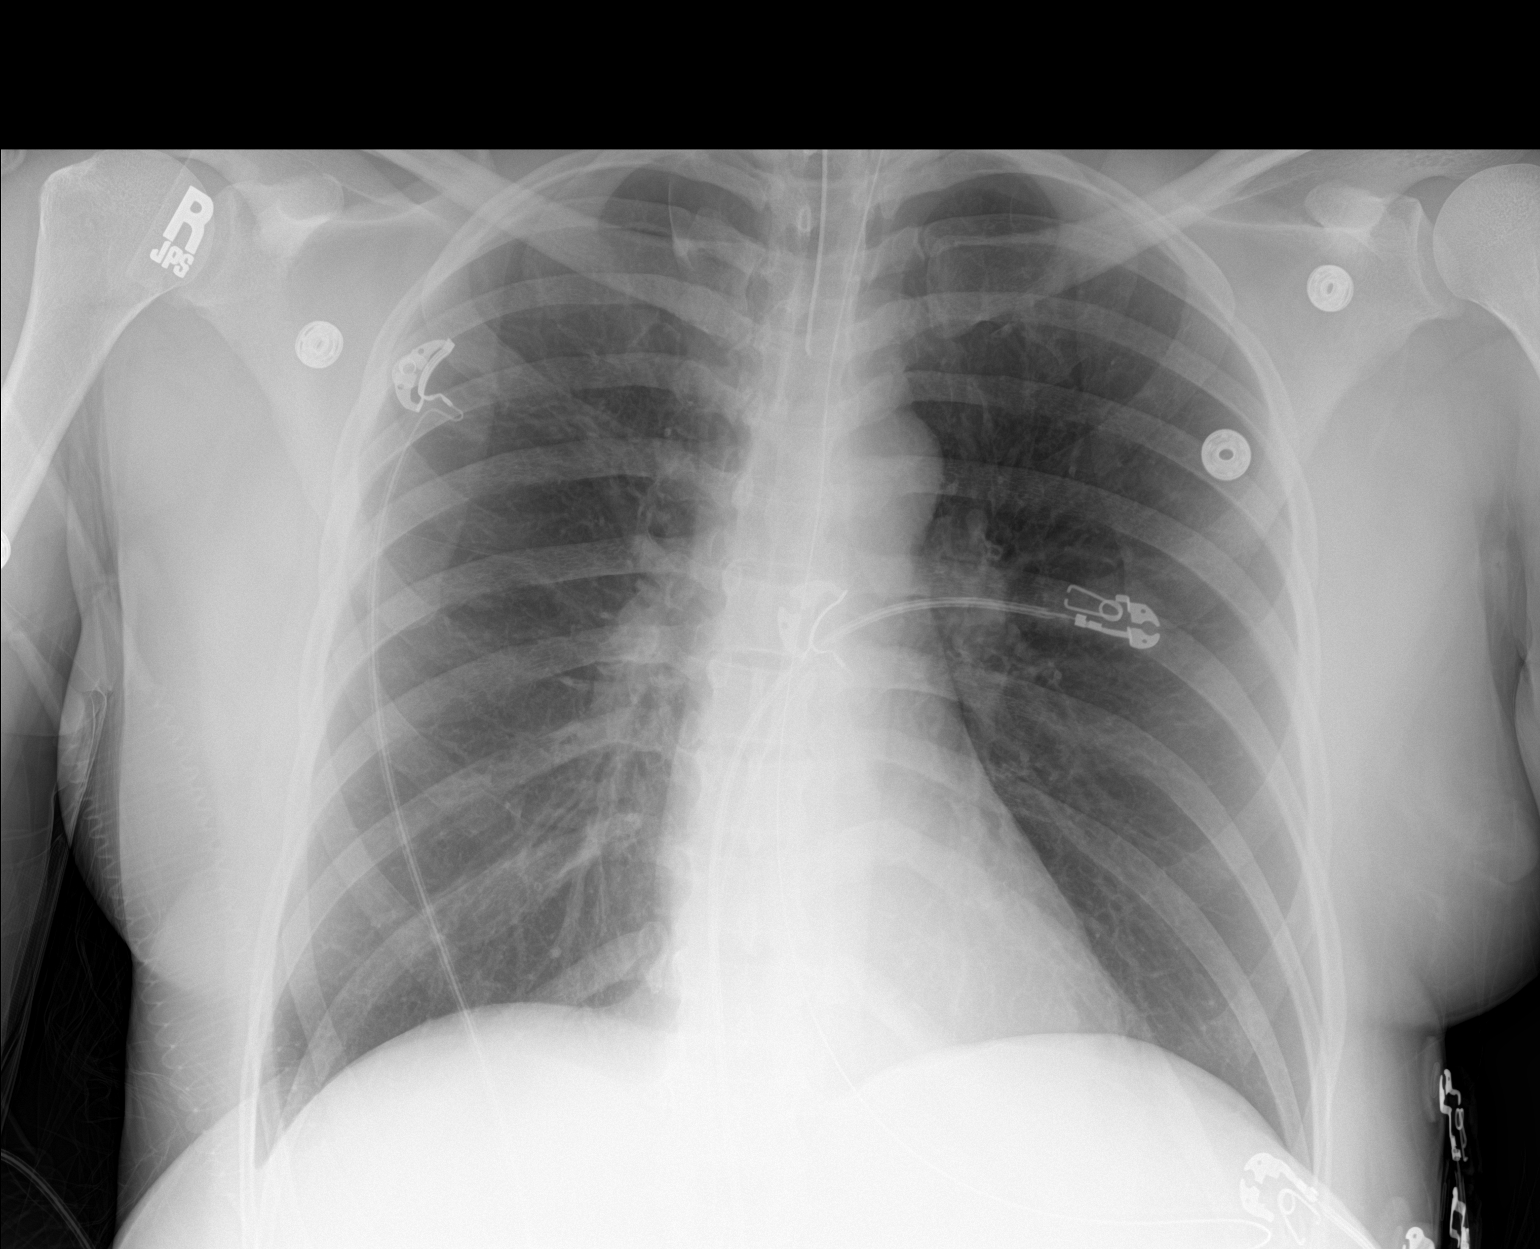

[1 of 1 positions shown; findings below may reference images not displayed]

FINDINGS: Cardiac shadows within normal limits. Endotracheal tube and
nasogastric catheter are now noted in satisfactory position. The
lungs are well aerated bilaterally. No focal infiltrate or sizable
effusion is seen. No bony abnormality is noted.
IMPRESSION: Status post intubation and nasogastric catheter placement with
satisfactory position. No other change from the prior exam is noted.

## 2019-12-27 IMAGING — DX DG CHEST 1V PORT
1 series · 1 of 1 positions shown · non-contrast
Comparison: 10/14/2018 and CT from 10/14/2018

CLINICAL DATA: 42-year-old with acute respiratory failure.

EXAM:
PORTABLE CHEST 1 VIEW

[chest ap]
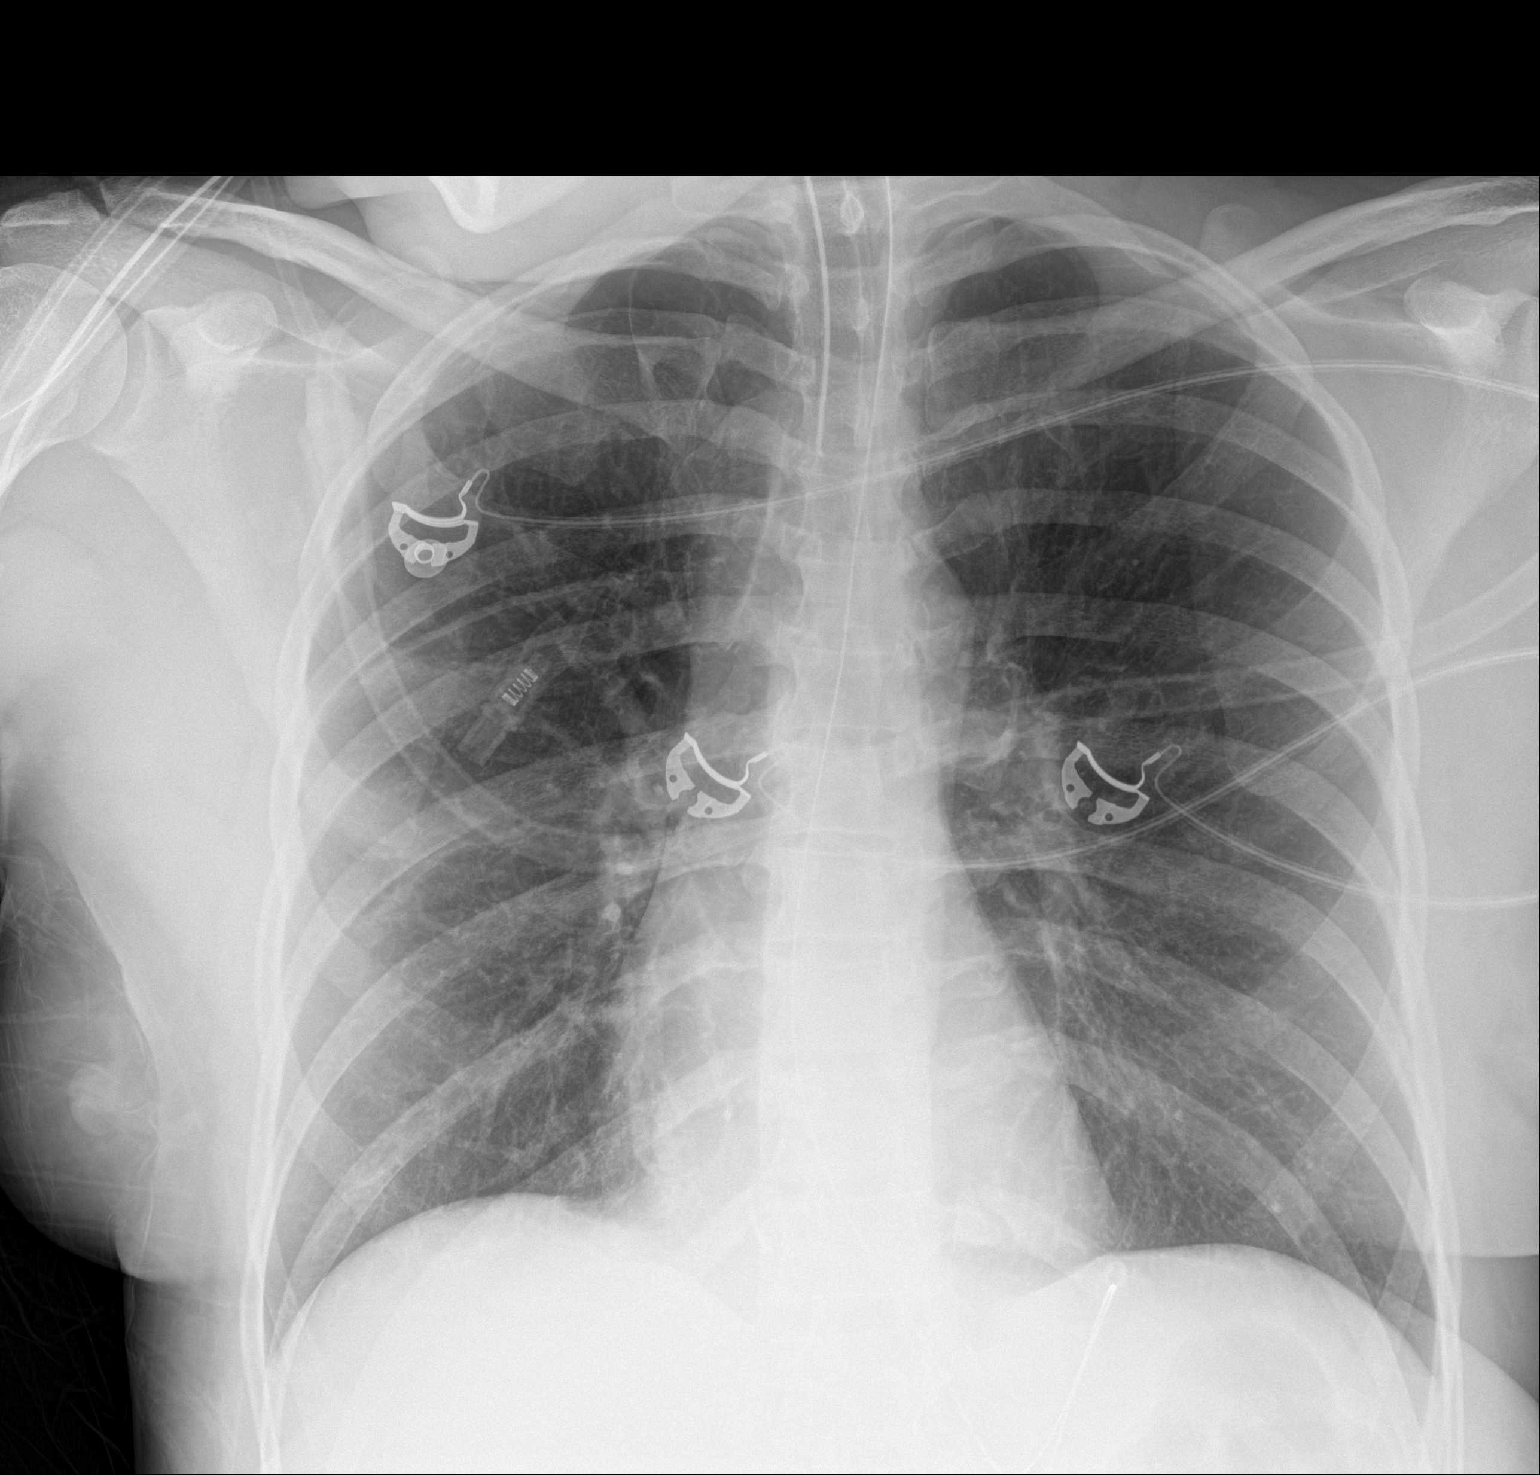

[1 of 1 positions shown; findings below may reference images not displayed]

FINDINGS: Endotracheal tube is 4.5 cm above the carina. Nasogastric tube
extends into the abdomen. Negative for pneumothorax. Lungs are well
aerated. Recent chest CT demonstrated collapse or volume loss in the
right middle lobe which is not well appreciated on this AP view.
Heart and mediastinum are within normal limits.
IMPRESSION: 1. Endotracheal tube is appropriately positioned.
2. Stable appearance of the lungs.

## 2019-12-28 IMAGING — DX DG CHEST 1V PORT
1 series · 1 of 1 positions shown · non-contrast
Comparison: Yesterday

CLINICAL DATA: Acute respiratory failure

EXAM:
PORTABLE CHEST 1 VIEW

[chest ap]
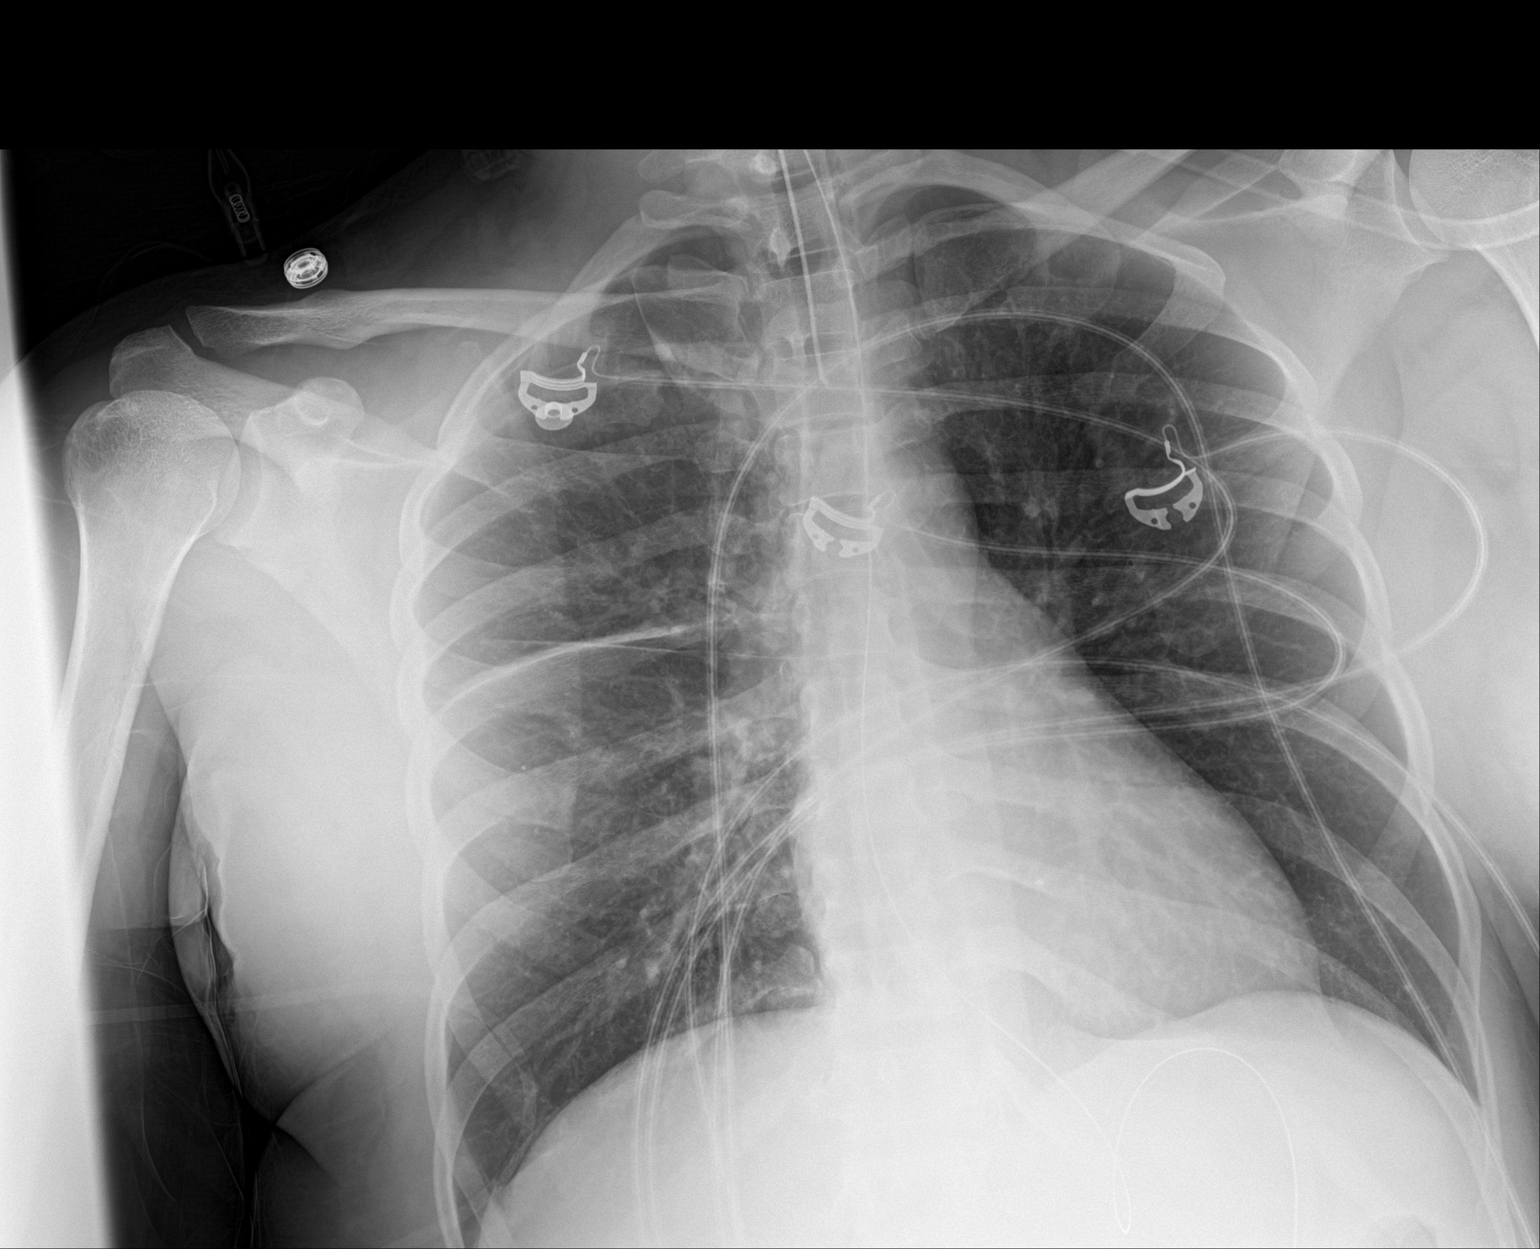

[1 of 1 positions shown; findings below may reference images not displayed]

FINDINGS: Endotracheal tube tip just below the clavicular heads. An orogastric
tube reaches the stomach.

Mild atelectasis has developed along the right minor fissure. The
lungs are otherwise clear.
IMPRESSION: 1. Stable hardware positioning.
2. New mild atelectasis along the minor fissure.

## 2019-12-29 IMAGING — DX DG CHEST 1V PORT
1 series · 1 of 1 positions shown · non-contrast
Comparison: 10/17/2018 at 2666 hours

CLINICAL DATA: Intubation

EXAM:
PORTABLE CHEST 1 VIEW

[chest]
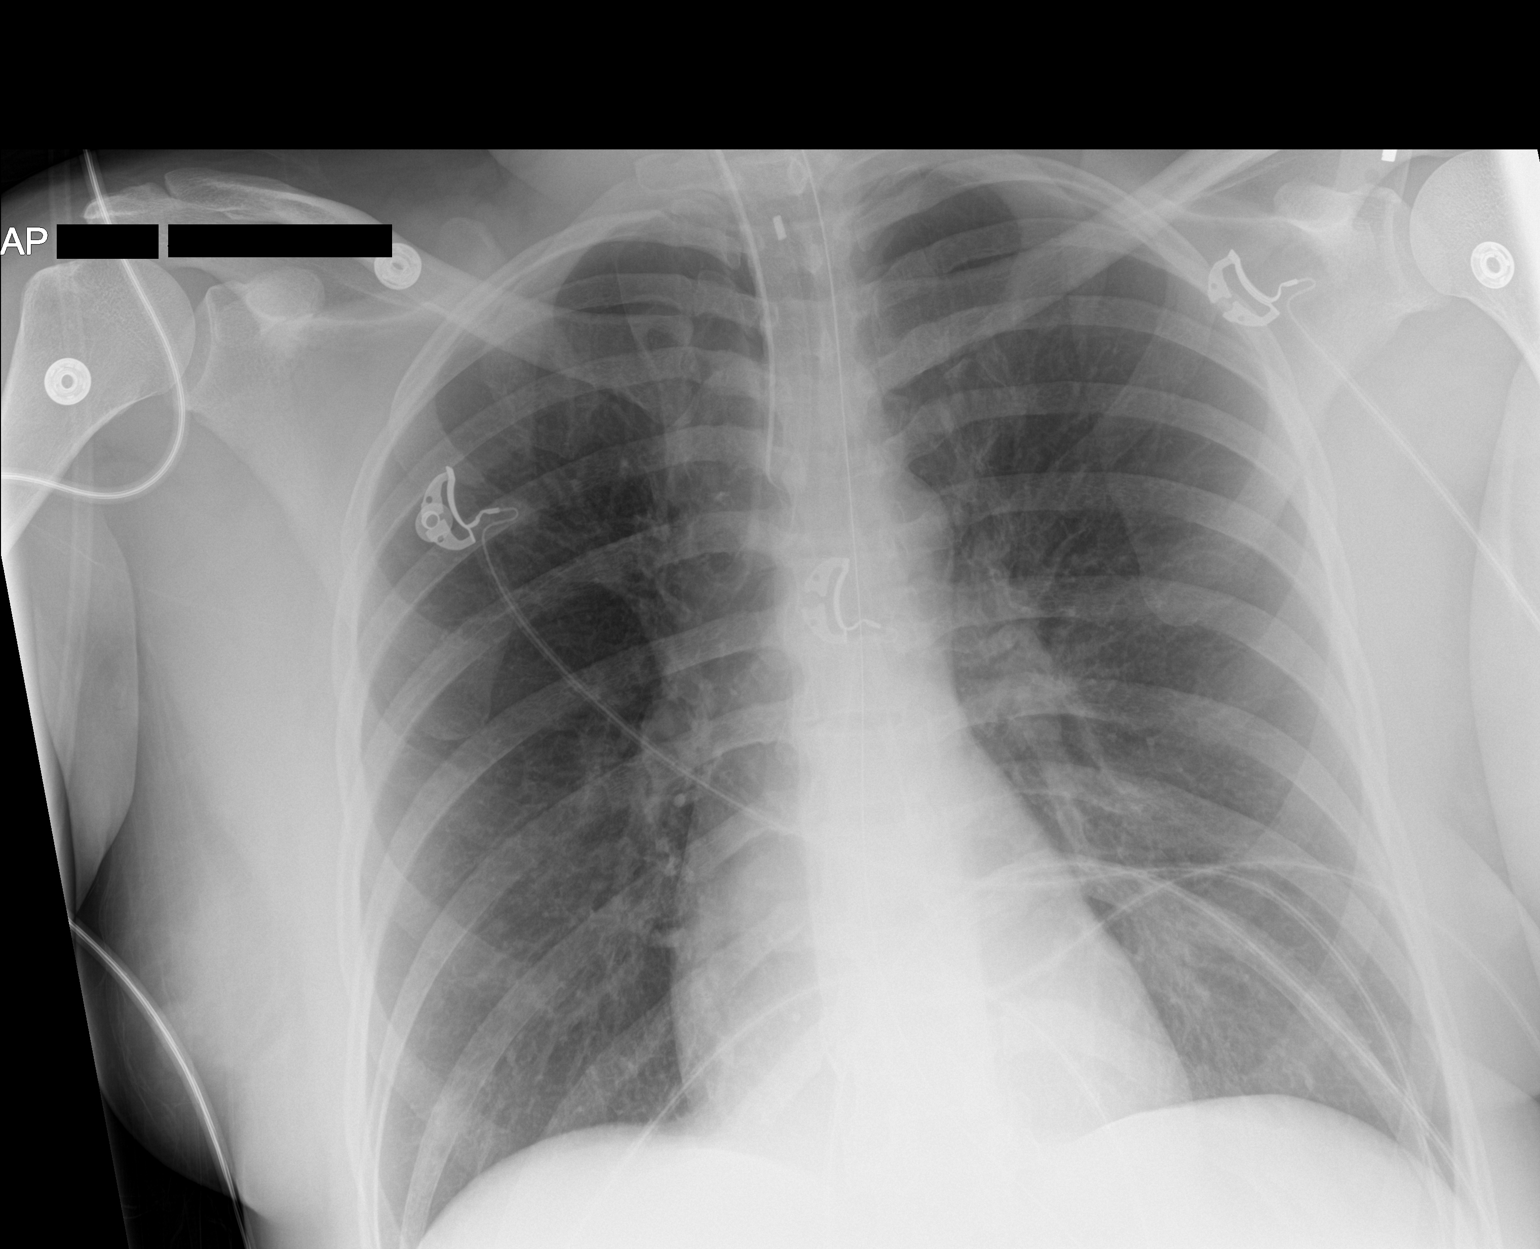

[1 of 1 positions shown; findings below may reference images not displayed]

FINDINGS: Endotracheal tube terminates 3 cm above the carina.

Lungs are clear.  Straight effusion

The heart is normal in size.

Enteric tube courses below the diaphragm.
IMPRESSION: Endotracheal tube terminates 3 cm above the carina.

## 2019-12-29 IMAGING — DX DG ABD PORTABLE 1V
1 series · 1 of 1 positions shown · non-contrast
Comparison: 10/17/2018 at 0992 hours

CLINICAL DATA: OG tube placement

EXAM:
PORTABLE ABDOMEN - 1 VIEW

[abdomen]
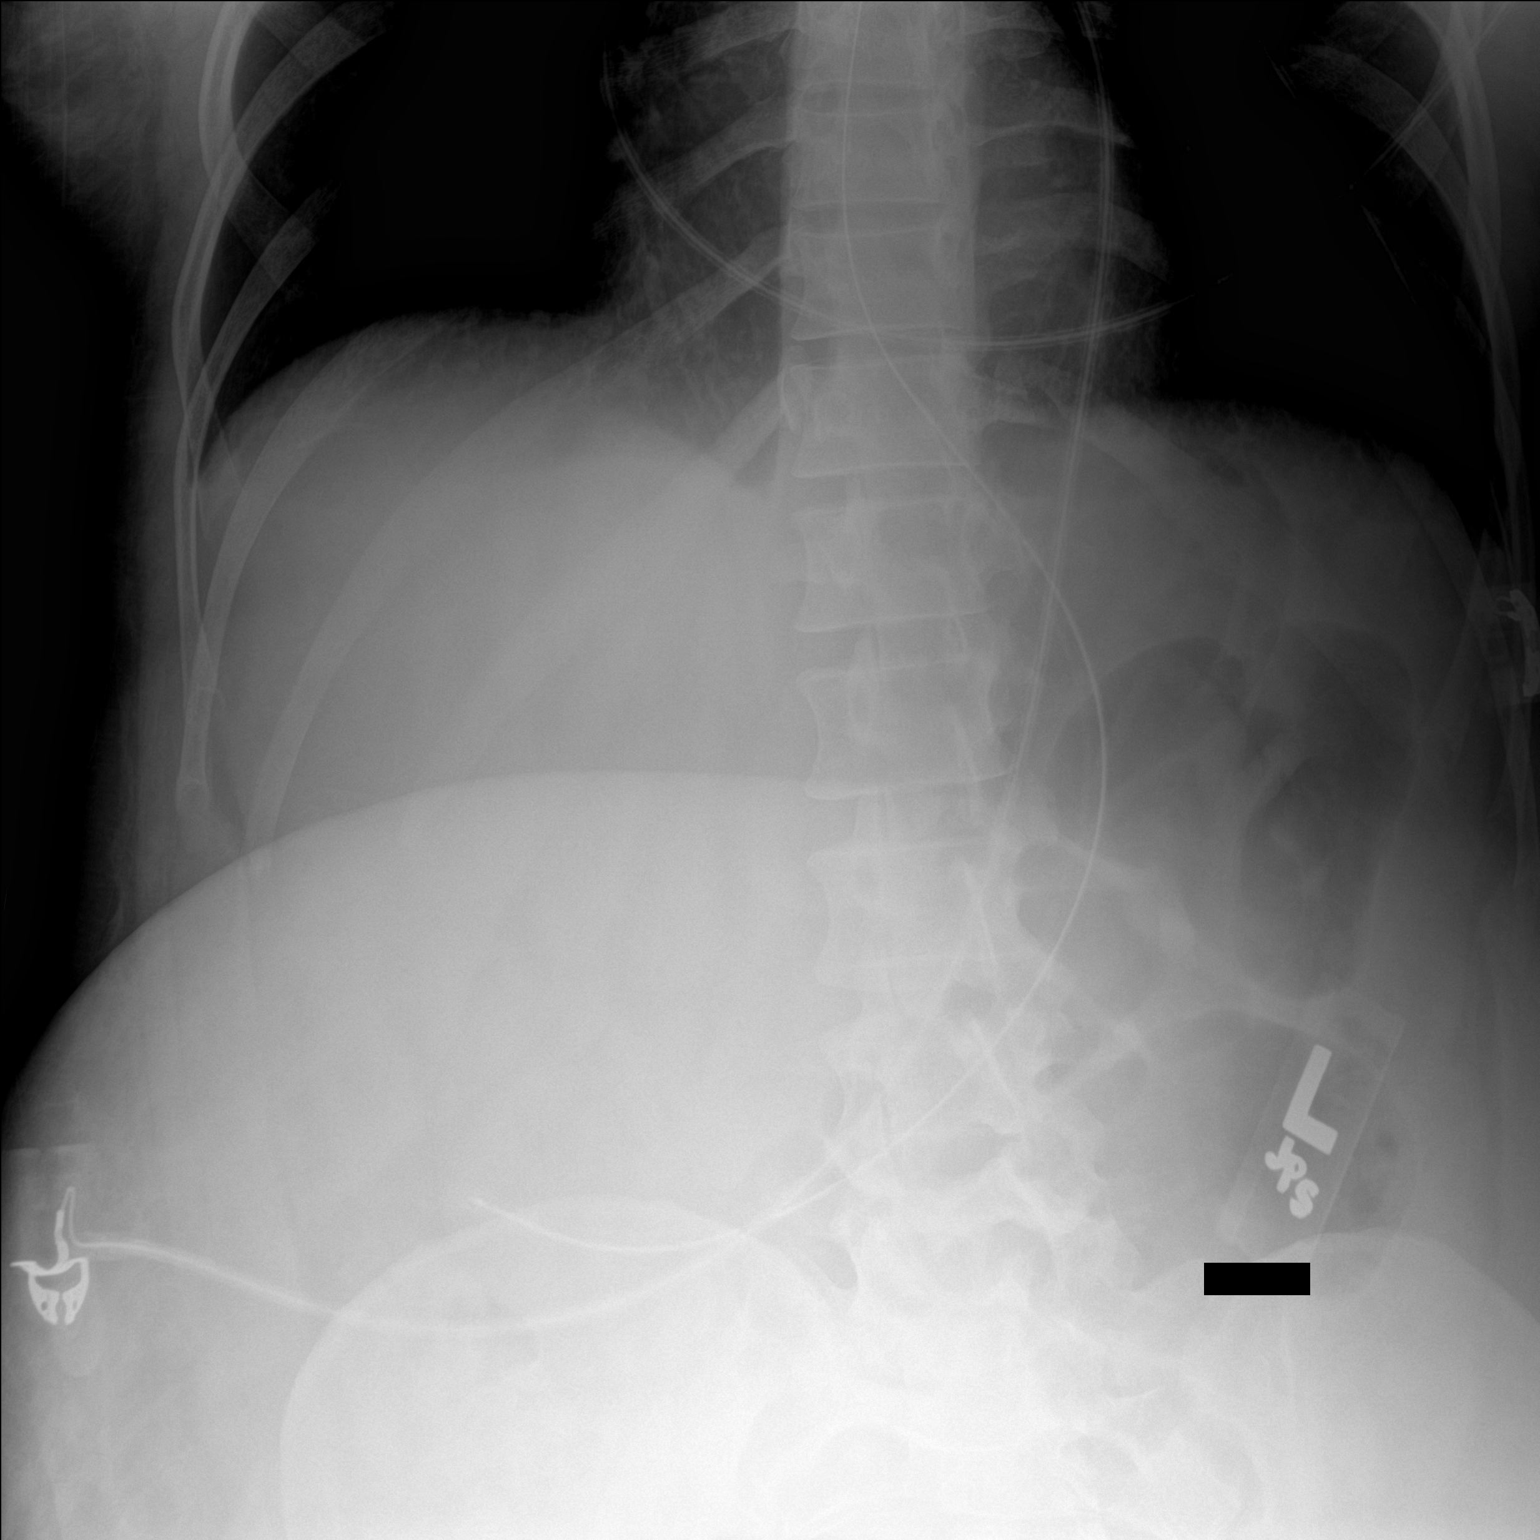

[1 of 1 positions shown; findings below may reference images not displayed]

FINDINGS: Enteric tube terminates in the proximal duodenum.
IMPRESSION: Enteric tube terminates in the proximal duodenum.

## 2020-02-19 ENCOUNTER — Other Ambulatory Visit: Payer: Self-pay | Admitting: Nurse Practitioner

## 2020-02-19 DIAGNOSIS — J4521 Mild intermittent asthma with (acute) exacerbation: Secondary | ICD-10-CM

## 2020-02-19 MED FILL — FLUTICASONE-SALMETEROL 100-: 100-50 | 30 days supply | Qty: 60 | Fill #1

## 2020-06-14 ENCOUNTER — Emergency Department (HOSPITAL_COMMUNITY)
Admission: EM | Admit: 2020-06-14 | Discharge: 2020-06-14 | Disposition: A | Discharge status: Home / Self Care | Payer: Self-pay | Attending: Emergency Medicine | Admitting: Emergency Medicine

## 2020-06-14 ENCOUNTER — Other Ambulatory Visit: Payer: Self-pay

## 2020-06-14 DIAGNOSIS — J45909 Unspecified asthma, uncomplicated: Secondary | ICD-10-CM | POA: Insufficient documentation

## 2020-06-14 DIAGNOSIS — Z5321 Procedure and treatment not carried out due to patient leaving prior to being seen by health care provider: Secondary | ICD-10-CM | POA: Insufficient documentation

## 2020-06-14 MED ORDER — ALBUTEROL SULFATE HFA 108 (90 BASE) MCG/ACT IN AERS
2.0000 | INHALATION_SPRAY | Freq: Once | RESPIRATORY_TRACT | Status: AC
  Administered 2020-06-14: 2 via RESPIRATORY_TRACT
  Filled 2020-06-14: qty 6.7

## 2020-06-14 NOTE — ED Notes (Signed)
Called for vitals x3 with no response.

## 2020-06-17 ENCOUNTER — Other Ambulatory Visit: Payer: Self-pay

## 2020-06-17 ENCOUNTER — Encounter (HOSPITAL_COMMUNITY): Payer: Self-pay | Admitting: Emergency Medicine

## 2020-06-17 ENCOUNTER — Inpatient Hospital Stay (HOSPITAL_COMMUNITY): Payer: Self-pay

## 2020-06-17 ENCOUNTER — Emergency Department (HOSPITAL_COMMUNITY): Payer: Self-pay

## 2020-06-17 ENCOUNTER — Inpatient Hospital Stay (HOSPITAL_COMMUNITY)
Admission: EM | Admit: 2020-06-17 | Discharge: 2020-06-18 | DRG: 191 | Disposition: A | Discharge status: Home / Self Care | Payer: Self-pay | Attending: Internal Medicine | Admitting: Internal Medicine

## 2020-06-17 DIAGNOSIS — R062 Wheezing: Secondary | ICD-10-CM

## 2020-06-17 DIAGNOSIS — J4521 Mild intermittent asthma with (acute) exacerbation: Secondary | ICD-10-CM

## 2020-06-17 DIAGNOSIS — D62 Acute posthemorrhagic anemia: Secondary | ICD-10-CM | POA: Diagnosis present

## 2020-06-17 DIAGNOSIS — J441 Chronic obstructive pulmonary disease with (acute) exacerbation: Principal | ICD-10-CM | POA: Insufficient documentation

## 2020-06-17 DIAGNOSIS — R0602 Shortness of breath: Secondary | ICD-10-CM

## 2020-06-17 DIAGNOSIS — Z599 Problem related to housing and economic circumstances, unspecified: Secondary | ICD-10-CM

## 2020-06-17 DIAGNOSIS — F319 Bipolar disorder, unspecified: Secondary | ICD-10-CM | POA: Diagnosis present

## 2020-06-17 DIAGNOSIS — N83201 Unspecified ovarian cyst, right side: Secondary | ICD-10-CM | POA: Diagnosis present

## 2020-06-17 DIAGNOSIS — Z79899 Other long term (current) drug therapy: Secondary | ICD-10-CM

## 2020-06-17 DIAGNOSIS — F17210 Nicotine dependence, cigarettes, uncomplicated: Secondary | ICD-10-CM | POA: Diagnosis present

## 2020-06-17 DIAGNOSIS — N920 Excessive and frequent menstruation with regular cycle: Secondary | ICD-10-CM | POA: Diagnosis present

## 2020-06-17 DIAGNOSIS — Z20822 Contact with and (suspected) exposure to covid-19: Secondary | ICD-10-CM | POA: Diagnosis present

## 2020-06-17 DIAGNOSIS — D259 Leiomyoma of uterus, unspecified: Secondary | ICD-10-CM | POA: Insufficient documentation

## 2020-06-17 DIAGNOSIS — D649 Anemia, unspecified: Secondary | ICD-10-CM

## 2020-06-17 DIAGNOSIS — E611 Iron deficiency: Secondary | ICD-10-CM | POA: Diagnosis present

## 2020-06-17 LAB — I-STAT BETA HCG BLOOD, ED (MC, WL, AP ONLY): I-stat hCG, quantitative: <5 m[IU]/mL (ref <5)

## 2020-06-17 LAB — I-STAT CHEM 8, ED
BUN: 11 mg/dL (ref 6–20)
Calcium, Ion: 1.03 mmol/L — ABNORMAL LOW (ref 1.15–1.40)
Chloride: 108 mmol/L (ref 98–111)
Creatinine, Ser: 0.9 mg/dL (ref 0.44–1.00)
Glucose, Bld: 101 mg/dL — ABNORMAL HIGH (ref 70–99)
HCT: 16 % — ABNORMAL LOW (ref 36.0–46.0)
Hemoglobin: 5.4 g/dL — CL (ref 12.0–15.0)
Potassium: 4.1 mmol/L (ref 3.5–5.1)
Sodium: 139 mmol/L (ref 135–145)
TCO2: 24 mmol/L (ref 22–32)

## 2020-06-17 LAB — BASIC METABOLIC PANEL
Anion gap: 7 (ref 5–15)
BUN: 10 mg/dL (ref 6–20)
CO2: 22 mmol/L (ref 22–32)
Calcium: 8.5 mg/dL — ABNORMAL LOW (ref 8.9–10.3)
Chloride: 111 mmol/L (ref 98–111)
Creatinine, Ser: 1.1 mg/dL — ABNORMAL HIGH (ref 0.44–1.00)
GFR calc Af Amer: >60 mL/min (ref >60)
GFR calc non Af Amer: >60 mL/min (ref >60)
Glucose, Bld: 92 mg/dL (ref 70–99)
Potassium: 4.5 mmol/L (ref 3.5–5.1)
Sodium: 140 mmol/L (ref 135–145)

## 2020-06-17 LAB — CBC WITH DIFFERENTIAL/PLATELET
Abs Immature Granulocytes: 0 10*3/uL (ref 0.00–0.07)
Basophils Absolute: 0.1 10*3/uL (ref 0.0–0.1)
Basophils Relative: 2 %
Eosinophils Absolute: 0.1 10*3/uL (ref 0.0–0.5)
Eosinophils Relative: 4 %
HCT: 18.4 % — ABNORMAL LOW (ref 36.0–46.0)
Hemoglobin: 4.6 g/dL — CL (ref 12.0–15.0)
Immature Granulocytes: 0 %
Lymphocytes Relative: 43 %
Lymphs Abs: 1.5 10*3/uL (ref 0.7–4.0)
MCH: 17.6 pg — ABNORMAL LOW (ref 26.0–34.0)
MCHC: 25 g/dL — ABNORMAL LOW (ref 30.0–36.0)
MCV: 70.2 fL — ABNORMAL LOW (ref 80.0–100.0)
Monocytes Absolute: 0.3 10*3/uL (ref 0.1–1.0)
Monocytes Relative: 10 %
Neutro Abs: 1.4 10*3/uL — ABNORMAL LOW (ref 1.7–7.7)
Neutrophils Relative %: 41 %
Platelets: 361 10*3/uL (ref 150–400)
RBC: 2.62 MIL/uL — ABNORMAL LOW (ref 3.87–5.11)
RDW: 20.2 % — ABNORMAL HIGH (ref 11.5–15.5)
WBC: 3.4 10*3/uL — ABNORMAL LOW (ref 4.0–10.5)
nRBC: 0.6 % — ABNORMAL HIGH (ref 0.0–0.2)

## 2020-06-17 LAB — IRON AND TIBC
Iron: 18 ug/dL — ABNORMAL LOW (ref 28–170)
Saturation Ratios: 3 % — ABNORMAL LOW (ref 10.4–31.8)
TIBC: 601 ug/dL — ABNORMAL HIGH (ref 250–450)
UIBC: 583 ug/dL

## 2020-06-17 LAB — SALICYLATE LEVEL: Salicylate Lvl: <7 mg/dL — ABNORMAL LOW (ref 7.0–30.0)

## 2020-06-17 LAB — RETICULOCYTES
Immature Retic Fract: 16 % — ABNORMAL HIGH (ref 2.3–15.9)
RBC.: 2.48 MIL/uL — ABNORMAL LOW (ref 3.87–5.11)
Retic Count, Absolute: 29.8 10*3/uL (ref 19.0–186.0)
Retic Ct Pct: 1.2 % (ref 0.4–3.1)

## 2020-06-17 LAB — RAPID URINE DRUG SCREEN, HOSP PERFORMED
Amphetamines: NOT DETECTED
Barbiturates: NOT DETECTED
Benzodiazepines: NOT DETECTED
Cocaine: NOT DETECTED
Opiates: NOT DETECTED
Tetrahydrocannabinol: POSITIVE — AB

## 2020-06-17 LAB — PREPARE RBC (CROSSMATCH)

## 2020-06-17 LAB — FOLATE: Folate: 5.5 ng/mL — ABNORMAL LOW (ref >5.9)

## 2020-06-17 LAB — VITAMIN B12: Vitamin B-12: 195 pg/mL (ref 180–914)

## 2020-06-17 LAB — FERRITIN: Ferritin: 4 ng/mL — ABNORMAL LOW (ref 11–307)

## 2020-06-17 LAB — ACETAMINOPHEN LEVEL: Acetaminophen (Tylenol), Serum: <10 ug/mL — ABNORMAL LOW (ref 10–30)

## 2020-06-17 LAB — POC OCCULT BLOOD, ED: Fecal Occult Bld: NEGATIVE

## 2020-06-17 LAB — ETHANOL: Alcohol, Ethyl (B): <10 mg/dL (ref <10)

## 2020-06-17 LAB — SARS CORONAVIRUS 2 BY RT PCR (HOSPITAL ORDER, PERFORMED IN ~~LOC~~ HOSPITAL LAB): SARS Coronavirus 2: NEGATIVE

## 2020-06-17 MED ORDER — ALBUTEROL SULFATE (2.5 MG/3ML) 0.083% IN NEBU
2.5000 mg | INHALATION_SOLUTION | RESPIRATORY_TRACT | Status: DC | PRN
  Administered 2020-06-18 (×2): 2.5 mg via RESPIRATORY_TRACT
  Filled 2020-06-17 (×2): qty 3

## 2020-06-17 MED ORDER — OXYCODONE-ACETAMINOPHEN 7.5-325 MG PO TABS
1.0000 | ORAL_TABLET | Freq: Three times a day (TID) | ORAL | Status: DC | PRN
  Administered 2020-06-17 – 2020-06-18 (×3): 1 via ORAL
  Filled 2020-06-17 (×4): qty 1

## 2020-06-17 MED ORDER — SODIUM CHLORIDE 0.9% IV SOLUTION: Freq: Once | INTRAVENOUS | Status: AC

## 2020-06-17 MED ORDER — MOMETASONE FURO-FORMOTEROL FUM 100-5 MCG/ACT IN AERO
2.0000 | INHALATION_SPRAY | Freq: Two times a day (BID) | RESPIRATORY_TRACT | Status: DC
  Administered 2020-06-17 – 2020-06-18 (×2): 2 via RESPIRATORY_TRACT
  Filled 2020-06-17: qty 8.8

## 2020-06-17 MED ORDER — TRAZODONE HCL 100 MG PO TABS
100.0000 mg | ORAL_TABLET | Freq: Every day | ORAL | Status: DC
  Administered 2020-06-17: 100 mg via ORAL
  Filled 2020-06-17: qty 2

## 2020-06-17 MED ORDER — IPRATROPIUM-ALBUTEROL 0.5-2.5 (3) MG/3ML IN SOLN
3.0000 mL | Freq: Four times a day (QID) | RESPIRATORY_TRACT | Status: DC
  Filled 2020-06-17: qty 3

## 2020-06-17 MED ORDER — IPRATROPIUM-ALBUTEROL 0.5-2.5 (3) MG/3ML IN SOLN: 3.0000 mL | RESPIRATORY_TRACT | Status: DC | PRN

## 2020-06-17 MED ORDER — MEGESTROL ACETATE 40 MG PO TABS
40.0000 mg | ORAL_TABLET | Freq: Every day | ORAL | Status: DC
  Administered 2020-06-17 – 2020-06-18 (×2): 40 mg via ORAL
  Filled 2020-06-17 (×3): qty 1

## 2020-06-17 MED ORDER — GABAPENTIN 300 MG PO CAPS
300.0000 mg | ORAL_CAPSULE | Freq: Three times a day (TID) | ORAL | Status: DC
  Administered 2020-06-17 – 2020-06-18 (×3): 300 mg via ORAL
  Filled 2020-06-17: qty 3
  Filled 2020-06-17 (×2): qty 1

## 2020-06-17 MED ORDER — SODIUM CHLORIDE 0.9% FLUSH
3.0000 mL | Freq: Two times a day (BID) | INTRAVENOUS | Status: DC
  Administered 2020-06-18: 3 mL via INTRAVENOUS

## 2020-06-17 MED ORDER — PREDNISONE 20 MG PO TABS
40.0000 mg | ORAL_TABLET | Freq: Every day | ORAL | Status: DC
  Administered 2020-06-17 – 2020-06-18 (×2): 40 mg via ORAL
  Filled 2020-06-17 (×2): qty 2

## 2020-06-17 MED ORDER — IPRATROPIUM-ALBUTEROL 0.5-2.5 (3) MG/3ML IN SOLN
3.0000 mL | RESPIRATORY_TRACT | Status: DC | PRN
  Administered 2020-06-17: 3 mL via RESPIRATORY_TRACT

## 2020-06-17 MED ORDER — ALBUTEROL SULFATE HFA 108 (90 BASE) MCG/ACT IN AERS
2.0000 | INHALATION_SPRAY | Freq: Once | RESPIRATORY_TRACT | Status: AC
  Administered 2020-06-17: 2 via RESPIRATORY_TRACT
  Filled 2020-06-17: qty 6.7

## 2020-06-17 NOTE — H&P (Signed)
Date: 06/17/2020               Patient Name:  Deborah Melendez MRN: [id number]  DOB: [birth year] Age / Sex: 44 y.o., female   PCP: Charlott Rakes, MD         Medical Service: Internal Medicine Teaching Service         Attending Physician: Dr. Aldine Contes, MD    First Contact: Dr. Marianna Payment Pager: [phone]  Second Contact: Dr. Sharon Seller Pager: [phone]       After Hours (After 5p/  First Contact Pager: [phone]  weekends / holidays): Second Contact Pager: [phone]   Chief Complaint: SOB  History of Present Illness: Deborah Melendez is a 43 yo F with Hx Depression, Bipolar 1, Polysubstance use, Bronchitis/COPD, Anemia 2/2 Fibroids who presented for SOB. She reports slowly worsen SOB for about 2 weeks. She noticed she was wheezing some and came to the ED for treatment of her COPD as she cannot afford her inhalers outpatient. She repors fatigues and light headedness. She also reports some nausea with 3 episodes of vomiting in the past 2 week, but feels okay right now.   In the ED, Hgb noted to be 4.6 and 5.4 on repeat, WBC 3.4; BMP and CBC otherwise WNL, EKG with sinus tach, CXR stable. Patient to be admitted to ensure response to transfusion and further care.  Meds:  No current facility-administered medications on file prior to encounter.   Current Outpatient Medications on File Prior to Encounter  Medication Sig  . albuterol (VENTOLIN HFA) 108 (90 Base) MCG/ACT inhaler Inhale 2 puffs into the lungs every 6 (six) hours as needed for wheezing or shortness of breath. (Patient not taking: Reported on 10/04/2019)  . ferrous sulfate 325 (65 FE) MG tablet Take 1 tablet (325 mg total) by mouth daily with breakfast. (Patient not taking: Reported on 10/04/2019)  . Fluticasone-Salmeterol (ADVAIR) 100-50 MCG/DOSE AEPB Inhale 1 puff into the lungs 2 (two) times daily. (Patient not taking: Reported on 10/04/2019)  . gabapentin (NEURONTIN) 300 MG capsule Take 1 capsule (300 mg total) by mouth 3  (three) times daily. (Patient not taking: Reported on 11/19/2019)  . ipratropium-albuterol (DUONEB) 0.5-2.5 (3) MG/3ML SOLN Take 3 mLs by nebulization every 4 (four) hours as needed. (Patient not taking: Reported on 06/17/2020)  . loratadine (CLARITIN) 10 MG tablet Take 1 tablet (10 mg total) by mouth daily. (Patient not taking: Reported on 10/04/2019)  . megestrol (MEGACE) 40 MG tablet Take 1 tablet (40 mg total) by mouth daily. (Patient not taking: Reported on 11/19/2019)  . pantoprazole (PROTONIX) 40 MG tablet Take 1 tablet (40 mg total) by mouth daily. (Patient not taking: Reported on 11/19/2019)  . QUEtiapine (SEROQUEL) 25 MG tablet Take 1 tablet (25 mg total) by mouth at bedtime. (Patient not taking: Reported on 11/19/2019)  . traZODone (DESYREL) 100 MG tablet Take 1 tablet (100 mg total) by mouth at bedtime. (Patient not taking: Reported on 11/19/2019)     Allergies: Allergies as of 06/17/2020  . (No Known Allergies)   Past Medical History:  Diagnosis Date  . Asthma   . Bipolar 1 disorder, depressed, severe ([place]) 02/04/2017  . Cocaine abuse with cocaine-induced mood disorder ([place]) 07/27/2017  . COPD (chronic obstructive pulmonary disease) ([place])   . Depression   . Homicidal ideations   . Manic behavior ([place])   . MDD (major depressive disorder), recurrent severe, without psychosis ([place]) 08/08/2015  . Substance or medication-induced bipolar and related disorder with  onset during intoxication ([place]) 09/08/2016  . Suicidal ideation     Family History:  Family History  Problem Relation Age of Onset  . Diabetes Mother   . Hypertension Mother   . Drug abuse Father   . Schizophrenia Maternal Aunt    Social History:  Social History   Tobacco Use  . Smoking status: Current Every Day Smoker    Packs/day: 1.00    Types: Cigarettes  . Smokeless tobacco: Never Used  Vaping Use  . Vaping Use: Never used  Substance Use Topics  . Alcohol use: Yes    Alcohol/week: 1.0 standard drink     Types: 1 Cans of beer per week  . Drug use: Yes    Frequency: 5.0 times per week    Types: Cocaine, Marijuana    Comment: +Cocaine and THC   Review of Systems: A complete ROS was negative except as per HPI.  Physical Exam: Blood pressure 122/86, pulse 93, temperature 98.4 F (36.9 C), temperature source Oral, resp. rate 16, height 5\' 4"  (1.626 m), weight 72 kg, SpO2 99 %. Physical Exam Constitutional:      General: She is not in acute distress.    Appearance: Normal appearance.  Cardiovascular:     Rate and Rhythm: Normal rate and regular rhythm.     Pulses: Normal pulses.     Heart sounds: Normal heart sounds.  Pulmonary:     Effort: Pulmonary effort is normal. No respiratory distress.     Breath sounds: Wheezing present.  Abdominal:     General: Bowel sounds are normal. There is no distension.     Palpations: Abdomen is soft.     Tenderness: There is no abdominal tenderness.     Comments: Palpated possible enlarged uterus  Musculoskeletal:        General: No swelling or deformity.  Skin:    General: Skin is warm and dry.  Neurological:     General: No focal deficit present.     Mental Status: Mental status is at baseline.    EKG: personally reviewed my interpretation is sinus tachycardia, similar to previous  CXR: personally reviewed my interpretation is no active disease  Assessment & Plan by Problem: Active Problems:   Symptomatic anemia  Symptomatic Anemia Uterine Fibroid: Patient presented with SOB, Hgb noted to be 4.6 and 5.4 on repeat. She denies GI bleed, and FOBT was negative. Reports chronic menorrhagia. She had a similar presentation in October 2020 where she was found to have large fibroid. She was seen by OB/GYN inpatient and was to follow up outpatient for her fibroid. She has not been taking Megace or many of her home meds due to financial limitations. Iron studies and Transfusion ordered in ED. - Consult OB/GYN - Restart Megace - Transfuse 2 Units -  F/U Iron studies, B12, Folate - F/U post transfusion CBC - Follow CBC  COPD in mild exacerbation: History of recurrent bronchitis. WBC low/normal today, no fever. Intubated in 2020. Smokes .75 packs per day. In addition to Anemia, patient noted to have wheezing on exam. Has difficultly affording home meds. Previously on Advair) - Restart inhalers, Dulera here - Prednisone 40 mg Daily - PRN Duonebs - PRN Albuterol  Depression Bipolar 1: Previously on Seroquel and trazodone. Has not been taking due to cost. Endorses depression. Denies SI. - Hold for now as she is not taking at home  FEN: HH diet VTE ppx: SCDs Code Status: FULL   Dispo: Admit patient to Inpatient with expected  length of stay greater than 2 midnights.  Signed: Neva Seat, MD 06/17/2020, 12:26 PM  After 5pm on weekdays and 1pm on weekends: On Call pager: [phone]

## 2020-06-17 NOTE — ED Notes (Signed)
This RN inventoried all of this pts belongings. Valuables placed with security in Envelope [phone]. Other belongings placed in [place] [address].

## 2020-06-17 NOTE — ED Notes (Signed)
Called and ordered meal

## 2020-06-17 NOTE — ED Notes (Signed)
This RN paged the admitting provider regarding this pts request for pain medication and Blood Banks request to change her Blood Administration order.

## 2020-06-17 NOTE — ED Provider Notes (Addendum)
[facility] EMERGENCY DEPARTMENT Provider Note   CSN: [id number] Arrival date & time: 06/17/20  0429     History Chief Complaint  Patient presents with   Shortness of Breath   Suicidal    Deborah Melendez is a 44 y.o. female.  44 y.o female with a PMH of Asthma, Bipolar, Depression presents to the ED with a chief complaint of SOB x 3 days. Describes this as "hard for her to breath", reports she is in need of a breathing treatment as her "asthma gets bad". She also endorses a productive cough with some clear sputum. She has tried using her inhaler without improvement in symptoms. She also smokes tobacco but states "I been smoking less because I can't breath". She voiced SI to triage RN, however when questioned about this, she states "I dont want to hurt myself, but I am depressed about my boyfriend of 8 years is seeing someone else". In addition, patient endorses chest tightness exacerbated with coughing. She denies any abdominal pain, blood in her stool, hematemesis, or diarrhea. No fever, no recent sick exposures. LMP about a week ago.   The history is provided by the patient and medical records.       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   Suicidal ideation     Patient Active Problem List   Diagnosis Date Noted   Adjustment disorder with mixed anxiety and depressed mood 11/21/2019   SIRS (systemic inflammatory response syndrome) ([place]) 10/04/2019   Acute blood loss anemia 10/04/2019    Symptomatic anemia 04/18/2019   SOB (shortness of breath) 01/18/2019   Acute asthma exacerbation 01/18/2019   Asthma, chronic 12/21/2018   Bipolar I disorder, most recent episode depressed ([place]) 12/21/2018   Altered mental status    Acute respiratory failure with hypoxia ([place]) 10/14/2018   Bronchitis 10/05/2018   Acute bronchitis with asthma with acute exacerbation 10/04/2018   Tobacco use disorder 05/22/2017   Suicidal ideation    Cannabis use disorder, severe, dependence ([employer]) 01/20/2017   Cocaine use disorder, severe, dependence ([place]) 08/08/2015    Past Surgical History:  Procedure Laterality Date   FINGER SURGERY       OB History    Gravida  6   Para  4   Term      Preterm      AB      Living  4     SAB      TAB      Ectopic      Multiple      Live Births  0           Family History  Problem Relation Age of Onset   Diabetes Mother    Hypertension Mother    Drug abuse Father    Schizophrenia Maternal Aunt     Social History   Tobacco Use   Smoking status: Current Every Day Smoker    Packs/day: 1.00    Types: Cigarettes   Smokeless tobacco: Never Used  Vaping Use   Vaping Use: Never used  Substance Use Topics   Alcohol use: Yes    Alcohol/week: 1.0 standard drink    Types: 1 Cans of beer per week   Drug use: Yes    Frequency: 5.0 times per week    Types: Cocaine, Marijuana    Comment: +Cocaine and THC    Home Medications Prior to Admission medications   Medication Sig Start Date End Date Taking? Authorizing Provider  albuterol (VENTOLIN HFA) 108 (90 Base) MCG/ACT inhaler Inhale 2 puffs into the lungs every 6 (six) hours as needed for wheezing or shortness of breath. Patient not taking: Reported on 10/04/2019 04/21/19   Isabelle Course, MD  ferrous sulfate 325 (65 FE) MG tablet Take 1 tablet (325 mg total) by mouth daily with breakfast. Patient not taking: Reported on 10/04/2019 04/21/19   Isabelle Course, MD    Fluticasone-Salmeterol (ADVAIR) 100-50 MCG/DOSE AEPB Inhale 1 puff into the lungs 2 (two) times daily. Patient not taking: Reported on 10/04/2019 08/18/19   Gildardo Pounds, NP  gabapentin (NEURONTIN) 300 MG capsule Take 1 capsule (300 mg total) by mouth 3 (three) times daily. Patient not taking: Reported on 11/19/2019 10/07/19   Aline August, MD  ipratropium-albuterol (DUONEB) 0.5-2.5 (3) MG/3ML SOLN Take 3 mLs by nebulization every 4 (four) hours as needed. Patient not taking: Reported on 06/17/2020 08/18/19   Gildardo Pounds, NP  loratadine (CLARITIN) 10 MG tablet Take 1 tablet (10 mg total) by mouth daily. Patient not taking: Reported on 10/04/2019 08/18/19   Gildardo Pounds, NP  megestrol (MEGACE) 40 MG tablet Take 1 tablet (40 mg total) by mouth daily. Patient not taking: Reported on 11/19/2019 10/07/19   Aline August, MD  pantoprazole (PROTONIX) 40 MG tablet Take 1 tablet (40 mg total) by mouth daily. Patient not taking: Reported on 11/19/2019 10/07/19   Aline August, MD  QUEtiapine (SEROQUEL) 25 MG tablet Take 1 tablet (25 mg total) by mouth at bedtime. Patient not taking: Reported on 11/19/2019 10/07/19   Aline August, MD  traZODone (DESYREL) 100 MG tablet Take 1 tablet (100 mg total) by mouth at bedtime. Patient not taking: Reported on 11/19/2019 10/07/19   Aline August, MD    Allergies    Patient has no known allergies.  Review of Systems   Review of Systems  Constitutional: Negative for chills and fever.  HENT: Negative for sore throat.   Respiratory: Positive for cough, chest tightness and shortness of breath.   Cardiovascular: Negative for chest pain, palpitations and leg swelling.  Gastrointestinal: Negative for abdominal pain, anal bleeding, blood in stool, nausea and vomiting.  Genitourinary: Negative for difficulty urinating, flank pain, hematuria, vaginal bleeding and vaginal discharge.  Musculoskeletal: Negative for back pain.  Skin: Negative for pallor and  wound.  Neurological: Negative for light-headedness and headaches.  All other systems reviewed and are negative.   Physical Exam Updated Vital Signs BP 124/90    Pulse 100    Temp 98.8 F (37.1 C) (Oral)    Resp 18    Ht 5\' 4"  (1.626 m)    Wt 72 kg    SpO2 100%    BMI 27.25 kg/m   Physical Exam Vitals and nursing note reviewed.  Constitutional:      Appearance: She is well-developed. She is not ill-appearing.  HENT:     Head: Normocephalic and atraumatic.  Eyes:     Pupils: Pupils are equal, round, and reactive to light.  Cardiovascular:     Rate and Rhythm: Normal rate.  Pulmonary:     Effort: Pulmonary effort is normal. No tachypnea.     Breath sounds: Examination of the right-upper field reveals decreased breath sounds and wheezing. Examination of the left-upper field reveals decreased breath sounds and wheezing. Decreased breath sounds and wheezing present.  Chest:     Chest wall: No tenderness.  Musculoskeletal:     Right lower leg: No edema.     Left lower leg: No tenderness. No edema.  Skin:    General: Skin is warm.  Neurological:     Mental Status: She is alert and oriented to person, place, and time.     ED Results / Procedures / Treatments   Labs (all labs ordered are listed, but only abnormal results are displayed) Labs Reviewed  CBC WITH DIFFERENTIAL/PLATELET - Abnormal; Notable for the following components:      Result Value   WBC 3.4 (*)    RBC 2.62 (*)    Hemoglobin 4.6 (*)    HCT 18.4 (*)    MCV 70.2 (*)    MCH 17.6 (*)    MCHC 25.0 (*)    RDW 20.2 (*)    nRBC 0.6 (*)    Neutro Abs 1.4 (*)    All other components within normal limits  BASIC METABOLIC PANEL - Abnormal; Notable for the following components:   Creatinine, Ser 1.10 (*)    Calcium 8.5 (*)    All other components within normal limits  SALICYLATE LEVEL - Abnormal; Notable for the following components:   Salicylate Lvl <1.3 (*)    All other components within normal limits    ACETAMINOPHEN LEVEL - Abnormal; Notable for the following components:   Acetaminophen (Tylenol), Serum <10 (*)    All other components within normal limits  RAPID URINE DRUG SCREEN, HOSP PERFORMED - Abnormal; Notable for the following components:   Tetrahydrocannabinol POSITIVE (*)    All other components within normal limits  RETICULOCYTES - Abnormal; Notable for the following components:   RBC. 2.48 (*)    Immature Retic Fract 16.0 (*)    All other components within normal limits  I-STAT CHEM 8, ED - Abnormal; Notable for the following components:   Glucose, Bld 101 (*)    Calcium, Ion 1.03 (*)    Hemoglobin 5.4 (*)    HCT 16.0 (*)    All other components within normal limits  ETHANOL  VITAMIN B12  IRON AND TIBC  FERRITIN  FOLATE  I-STAT BETA HCG BLOOD, ED (MC, [facility], AP ONLY)  I-STAT BETA HCG BLOOD, ED ([facility], [facility], [facility] ONLY)  POC OCCULT BLOOD, ED  TYPE AND SCREEN  PREPARE RBC (CROSSMATCH)    EKG EKG Interpretation  Date/Time:  Monday June 17 2020 04:34:51 EDT Ventricular Rate:  115 PR Interval:  142 QRS Duration: 74 QT Interval:  332 QTC Calculation: 459 R Axis:   91 Text Interpretation: Sinus tachycardia Rightward axis Septal infarct , age undetermined Abnormal ECG When compared with ECG of 11/19/2019, No significant change was found Confirmed by Delora Fuel ([id number]) on 06/17/2020 5:17:50 AM   Radiology DG Chest 2 View  Result Date: 06/17/2020 CLINICAL DATA:  Shortness of breath EXAM: CHEST - 2 VIEW COMPARISON:  November 19, 2019 FINDINGS: The heart size and mediastinal contours are within normal limits. Both lungs are clear. The visualized skeletal structures are unremarkable. IMPRESSION: No active cardiopulmonary disease. Electronically Signed   By: Prudencio Pair M.D.   On: 06/17/2020 05:08    Procedures .Critical Care Performed by:  Janeece Fitting, PA-C Authorized by: Janeece Fitting, PA-C   Critical care provider statement:    Critical care time (minutes):  45    Critical care start time:  06/17/2020 10:30 AM   Critical care end time:  06/17/2020 11:15 AM   Critical care time was exclusive of:  Separately billable procedures and treating other patients   Critical care was necessary to treat or prevent imminent or life-threatening deterioration of the following conditions:  Circulatory failure   Critical care was time spent personally by me on the following activities:  Blood draw for specimens, development of treatment plan with patient or surrogate, discussions with consultants, evaluation of patient's response to treatment, examination of patient, obtaining history from patient or surrogate, ordering and performing treatments and interventions, ordering and review of laboratory studies, ordering and review of radiographic studies, pulse oximetry, re-evaluation of patient's condition and review of old charts   (including critical care time)  Medications Ordered in ED Medications  0.9 %  sodium chloride infusion (Manually program via Guardrails IV Fluids) (has no administration in time range)  albuterol (VENTOLIN HFA) 108 (90 Base) MCG/ACT inhaler 2 puff (2 puffs Inhalation Given 06/17/20 0442)    ED Course  I have reviewed the triage vital signs and the nursing notes.  Pertinent labs & imaging results that were available during my care of the patient were reviewed by me and considered in my medical decision making (see chart for details).  Clinical Course as of Jun 18 1135  Mon Jun 17, 2020  0759 Hemoglobin(!!): 4.6 [JS]  1007 Fecal Occult Blood, POC: NEGATIVE [JS]  0865 Salicylate Lvl(!): <7.8 [JS]    Clinical Course User Index [JS] Janeece Fitting, PA-C   MDM Rules/Calculators/A&P   Patient with a past medical history of asthma, COPD, current tobacco user presents to the ED with complaints of shortness of breath for the past 3 days.  Reports she has been out of a breathing treatment, has tried to use her inhaler without improvement in her  symptoms.  She also endorses a productive cough with some yellow sputum along with some chest tightness with coughing.  While getting triage, patient also reported SI to RN triage, when asked to explain about this during patient interview, she reports she has been depressed as her boyfriend of 8 years started to see another girl.  Patient is AOX4, currently eating a sandwich during primary evaluation. She is in no distress, HR slightly increase at 100, no tachypnea or hypoxia. Lungs with wheezing present to upper lung fields BL. No chest tenderness with palpation, abdomen is soft, bowel sounds are normal. No BL pitting edema.   Labs interpret by me with a BMP without electrolyte derangement, no creatine is slightly elevated but within her baseline. CBC remarkable for hemoglobin of 4.6, denies any vaginal bleeding, hematemesis, melena.  She does report she just finished her menstrual cycle.  According to her chart after extensive review, patient was previously admitted for symptomatic anemia, was found to have a hemoglobin of 6, source was identified to be likely her uterine fibroid.  She is not complaining of any abdominal pain on today's visit. Anemia panel has been added, will also performed hemocult.   Hemoccult is negative today.  She does report some vaginal spotting, deferred pelvic exam at this time as she does not endorse any pelvic pain.Patient will need transfusion has been ordered 1 unit of blood. She is symptomatic with SOB but also has wheezing, therefore mg+ has been ordered.  Will place call for hospitalist admission.   Continues to deny SI,HI, visual or auditory hallucinations.   11:36 AM Spoke to IM service who will admit patient for further management.    Portions of this note were generated with [employer]. Dictation errors may occur despite best attempts at proofreading.  Final Clinical Impression(s) / ED Diagnoses Final diagnoses:  Symptomatic anemia  Shortness of  breath  Wheezing    Rx / DC Orders ED Discharge Orders    None       Janeece Fitting, PA-C 06/17/20 1115    Janeece Fitting, PA-C 06/17/20 1136    Truddie Hidden, MD 06/17/20 1451

## 2020-06-17 NOTE — Consult Note (Signed)
OB/GYN Consult Note  Referring Provider: Dr. Stefanie Libel Deborah Melendez is a 44 y.o. G6P4 admitted for transfusion for symptomatic anemia, likely 2/2 menorrhagia. Also with COPD exacerbation. Gyn consulted for menorrhagia and enlarged fibroid uterus. Patient had similar presentation last fall and was sent home with Megace and instructions to f/u as outpatient.  Patient reports she has had heavy bleeding with irregular periods for "a long time." She has not been taking medication (Megace) as prescribed following last visit to ED in 2020 and states she is "not good with appointments." She is frustrated with how much she has been bleeding, would like something done about it. States she wears the very big pads and bleeding is interfering with her life, cannot have intercourse due to this. States she started having some spotting now once she got the first bag of her blood transfusion.   She is short of breath, which is why she presented to ED.      Past Medical History:  Diagnosis Date   Asthma    Bipolar 1 disorder, depressed, severe ([place]) 02/04/2017   Cocaine abuse with cocaine-induced mood disorder ([place]) 07/27/2017   COPD (chronic obstructive pulmonary disease) ([place])    Depression    Homicidal ideations    Manic behavior ([place])    MDD (major depressive disorder), recurrent severe, without psychosis ([place]) 08/08/2015   Substance or medication-induced bipolar and related disorder with onset during intoxication ([place]) 09/08/2016   Suicidal ideation     Past Surgical History:  Procedure Laterality Date   FINGER SURGERY      OB History  Gravida Para Term Preterm AB Living  6 4       4   SAB TAB Ectopic Multiple Live Births          0    # Outcome Date GA Lbr Len/2nd Weight Sex Delivery Anes PTL Lv  6 Gravida           5 Para           4 Para           3 Para           2 Para           1 Gravida             Social History   Socioeconomic History   Marital status:  Significant Other    Spouse name: Not on file   Number of children: Not on file   Years of education: Not on file   Highest education level: Not on file  Occupational History   Occupation: Unemploed  Tobacco Use   Smoking status: Current Every Day Smoker    Packs/day: 1.00    Types: Cigarettes   Smokeless tobacco: Never Used  Vaping Use   Vaping Use: Never used  Substance and Sexual Activity   Alcohol use: Yes    Alcohol/week: 1.0 standard drink    Types: 1 Cans of beer per week   Drug use: Yes    Frequency: 5.0 times per week    Types: Cocaine, Marijuana    Comment: +Cocaine and THC   Sexual activity: Yes    Birth control/protection: None  Other Topics Concern   Not on file  Social History Narrative   Pt lives in [place] with partner/boyfriend.  She is unemployed.  Pt is supposed to be followed by [facility], but she has not been in several months, and she is off medication.   Social Determinants  of Health   Financial Resource Strain:    Difficulty of Paying Living Expenses:   Food Insecurity:    Worried About Charity fundraiser in the Last Year:    Arboriculturist in the Last Year:   Transportation Needs:    Film/video editor (Medical):    Lack of Transportation (Non-Medical):   Physical Activity:    Days of Exercise per Week:    Minutes of Exercise per Session:   Stress:    Feeling of Stress :   Social Connections:    Frequency of Communication with Friends and Family:    Frequency of Social Gatherings with Friends and Family:    Attends Religious Services:    Active Member of Clubs or Organizations:    Attends Music therapist:    Marital Status:     Family History  Problem Relation Age of Onset   Diabetes Mother    Hypertension Mother    Drug abuse Father    Schizophrenia Maternal Aunt     (Not in a hospital admission)   No Known Allergies  Review of Systems: Negative except for what is mentioned in  HPI.     Physical Exam: BP 128/89 (BP Location: Right Arm)    Pulse 97    Temp 98.4 F (36.9 C) (Oral)    Resp 16    Ht 5\' 4"  (1.626 m)    Wt 72 kg    SpO2 100%    BMI 27.25 kg/m  CONSTITUTIONAL: Well-developed, well-nourished female in mild distress. Slightly disheveled HENT:  Normocephalic, atraumatic, External right and left ear normal. Oropharynx is clear and moist EYES: Conjunctivae and EOM are normal. Pupils are equal, round, and reactive to light. No scleral icterus.  NECK: Normal range of motion, supple, no masses SKIN: Skin is warm and dry. No rash noted. Not diaphoretic. No erythema. No pallor. [place]: Alert and oriented to person, place, and time. Normal reflexes, muscle tone coordination. No cranial nerve deficit noted. PSYCHIATRIC: Normal mood and affect. Normal behavior. Normal judgment and thought content. CARDIOVASCULAR: Normal heart rate noted RESPIRATORY: Effort normal, no problems with respiration noted ABDOMEN: Soft, nontender, nondistended, uterus enlarged with fibroid palpable at umbilicus PELVIC: pt declined MUSCULOSKELETAL: Normal range of motion. No edema and no tenderness. 2+ distal pulses.   Pertinent Labs/Studies:   Results for orders placed or performed during the hospital encounter of 06/17/20 (from the past 72 hour(s))  CBC with Differential     Status: Abnormal   Collection Time: 06/17/20  4:45 AM  Result Value Ref Range   WBC 3.4 (L) 4.0 - 10.5 K/uL   RBC 2.62 (L) 3.87 - 5.11 MIL/uL   Hemoglobin 4.6 (LL) 12.0 - 15.0 g/dL    Comment: REPEATED TO VERIFY Reticulocyte Hemoglobin testing may be clinically indicated, consider ordering this additional test [id number] THIS CRITICAL RESULT HAS VERIFIED AND BEEN CALLED TO B SANGALANG,RN BY KIM COLVIN ON 06 21 2021 AT 0552, AND HAS BEEN READ BACK.     HCT 18.4 (L) 36 - 46 %   MCV 70.2 (L) 80.0 - 100.0 fL   MCH 17.6 (L) 26.0 - 34.0 pg   MCHC 25.0 (L) 30.0 - 36.0 g/dL   RDW 20.2 (H) 11.5 - 15.5 %    Platelets 361 150 - 400 K/uL   nRBC 0.6 (H) 0.0 - 0.2 %   Neutrophils Relative % 41 %   Neutro Abs 1.4 (L) 1.7 - 7.7 K/uL   Lymphocytes  Relative 43 %   Lymphs Abs 1.5 0.7 - 4.0 K/uL   Monocytes Relative 10 %   Monocytes Absolute 0.3 0 - 1 K/uL   Eosinophils Relative 4 %   Eosinophils Absolute 0.1 0 - 0 K/uL   Basophils Relative 2 %   Basophils Absolute 0.1 0 - 0 K/uL   Immature Granulocytes 0 %   Abs Immature Granulocytes 0.00 0.00 - 0.07 K/uL    Comment: Performed at [facility], [place] [address], [place], [place] [address]  Basic metabolic panel     Status: Abnormal   Collection Time: 06/17/20  4:45 AM  Result Value Ref Range   Sodium 140 135 - 145 mmol/L   Potassium 4.5 3.5 - 5.1 mmol/L   Chloride 111 98 - 111 mmol/L   CO2 22 22 - 32 mmol/L   Glucose, Bld 92 70 - 99 mg/dL    Comment: Glucose reference range applies only to samples taken after fasting for at least 8 hours.   BUN 10 6 - 20 mg/dL   Creatinine, Ser 1.10 (H) 0.44 - 1.00 mg/dL   Calcium 8.5 (L) 8.9 - 10.3 mg/dL   GFR calc non Af Amer >60 >60 mL/min   GFR calc Af Amer >60 >60 mL/min   Anion gap 7 5 - 15    Comment: Performed at [facility] [address], [place], [place] [address]  I-Stat Beta hCG blood, ED ([facility], [facility], [facility] only)     Status: None   Collection Time: 06/17/20  5:26 AM  Result Value Ref Range   I-stat hCG, quantitative <5.0 <5 mIU/mL   Comment 3            Comment:   GEST. AGE      CONC.  (mIU/mL)   <=1 WEEK        5 - 50     2 WEEKS       50 - 500     3 WEEKS       100 - 10,000     4 WEEKS     1,000 - 30,000        FEMALE AND NON-PREGNANT FEMALE:     LESS THAN 5 mIU/mL   Rapid urine drug screen (hospital performed)     Status: Abnormal   Collection Time: 06/17/20  7:32 AM  Result Value Ref Range   Opiates NONE DETECTED NONE DETECTED   Cocaine NONE DETECTED NONE DETECTED   Benzodiazepines NONE DETECTED NONE DETECTED   Amphetamines NONE DETECTED NONE DETECTED   Tetrahydrocannabinol  POSITIVE (A) NONE DETECTED   Barbiturates NONE DETECTED NONE DETECTED    Comment: (NOTE) DRUG SCREEN FOR MEDICAL PURPOSES ONLY.  IF CONFIRMATION IS NEEDED FOR ANY PURPOSE, NOTIFY LAB WITHIN 5 DAYS.  LOWEST DETECTABLE LIMITS FOR URINE DRUG SCREEN Drug Class                     Cutoff (ng/mL) Amphetamine and metabolites    1000 Barbiturate and metabolites    200 Benzodiazepine                 923 Tricyclics and metabolites     300 Opiates and metabolites        300 Cocaine and metabolites        300 THC  50 Performed at [facility], [place] [address], [place], [place] [address]   Ethanol     Status: None   Collection Time: 06/17/20  8:03 AM  Result Value Ref Range   Alcohol, Ethyl (B) <10 <10 mg/dL    Comment: (NOTE) Lowest detectable limit for serum alcohol is 10 mg/dL.  For medical purposes only. Performed at [facility], [place] [address], [place], [place] [address]   Salicylate level     Status: Abnormal   Collection Time: 06/17/20  8:03 AM  Result Value Ref Range   Salicylate Lvl <6.5 (L) 7.0 - 30.0 mg/dL    Comment: Performed at [facility] [address] [place], [place] [address]  Acetaminophen level     Status: Abnormal   Collection Time: 06/17/20  8:03 AM  Result Value Ref Range   Acetaminophen (Tylenol), Serum <10 (L) 10 - 30 ug/mL    Comment: (NOTE) Therapeutic concentrations vary significantly. A range of 10-30 ug/mL  may be an effective concentration for many patients. However, some  are best treated at concentrations outside of this range. Acetaminophen concentrations >150 ug/mL at 4 hours after ingestion  and >50 ug/mL at 12 hours after ingestion are often associated with  toxic reactions.  Performed at [facility], [place] [address] [place], [place] [address]   Reticulocytes     Status: Abnormal   Collection Time: 06/17/20  8:03 AM  Result Value Ref Range   Retic Ct Pct 1.2 0.4 - 3.1 %   RBC. 2.48  (L) 3.87 - 5.11 MIL/uL   Retic Count, Absolute 29.8 19.0 - 186.0 K/uL   Immature Retic Fract 16.0 (H) 2.3 - 15.9 %    Comment: Performed at [facility] [address] [place], [place] [address]  I-stat chem 8, ED (not at [facility] or [facility])     Status: Abnormal   Collection Time: 06/17/20  8:14 AM  Result Value Ref Range   Sodium 139 135 - 145 mmol/L   Potassium 4.1 3.5 - 5.1 mmol/L   Chloride 108 98 - 111 mmol/L   BUN 11 6 - 20 mg/dL   Creatinine, Ser 0.90 0.44 - 1.00 mg/dL   Glucose, Bld 101 (H) 70 - 99 mg/dL    Comment: Glucose reference range applies only to samples taken after fasting for at least 8 hours.   Calcium, Ion 1.03 (L) 1.15 - 1.40 mmol/L   TCO2 24 22 - 32 mmol/L   Hemoglobin 5.4 (LL) 12.0 - 15.0 g/dL   HCT 16.0 (L) 36 - 46 %  POC occult blood, ED     Status: None   Collection Time: 06/17/20 10:00 AM  Result Value Ref Range   Fecal Occult Bld NEGATIVE NEGATIVE  Type and screen [place]     Status: None (Preliminary result)   Collection Time: 06/17/20 10:28 AM  Result Value Ref Range   ABO/RH(D) O POS    Antibody Screen NEG    Sample Expiration 06/20/2020,2359    Unit Number [id number]    Blood Component Type RED CELLS,LR    Unit division 00    Status of Unit ISSUED    Transfusion Status OK TO TRANSFUSE    Crossmatch Result      Compatible Performed at [facility], [place] [address], [place], [place] [address]   Prepare RBC (crossmatch)     Status: None   Collection Time: 06/17/20 10:28 AM  Result Value Ref Range   Order Confirmation      ORDER PROCESSED BY BLOOD BANK Performed at [facility], [place] [address] [place], [place] [address]   Vitamin B12     Status: None   Collection Time: 06/17/20 10:30 AM  Result Value Ref Range   Vitamin B-12 195 180 - 914 pg/mL    Comment: (NOTE) This assay is not validated for testing neonatal or myeloproliferative syndrome specimens for Vitamin B12 levels. Performed at [facility], [place] [address] [place], [place] [address]   Iron and TIBC     Status: Abnormal   Collection Time: 06/17/20 10:30 AM  Result Value Ref Range   Iron 18 (L) 28 - 170 ug/dL   TIBC 601 (H) 250 - 450 ug/dL   Saturation Ratios 3 (L) 10.4 - 31.8 %   UIBC 583 ug/dL    Comment: Performed at [facility], [place] [address], [place], [place] [address]  Ferritin     Status: Abnormal   Collection Time: 06/17/20 10:30 AM  Result Value Ref Range   Ferritin 4 (L) 11 - 307 ng/mL    Comment: Performed at [facility], [place] [address], [place], [place] [address]  Folate     Status: Abnormal   Collection Time: 06/17/20 10:30 AM  Result Value Ref Range   Folate 5.5 (L) >5.9 ng/mL    Comment: Performed at [facility], [place] [address] [place], [place] [address]       Assessment and Plan :Deborah Melendez is a 44 y.o. G6P4 admitted for blood transfusion, stabilization of COPD, symptomatic anemia likely 2/2 long-standing menorrhagia. Reviewed plan with patient to obtain ultrasound and start PO medication, then have her follow up as outpatient for management and further planning/treatment. I reviewed that she would likely need hysterectomy given the size of her fibroids and that she needs to be stableized. Patient visibly tearful and frustrated that nothing can be done today about her bleeding, reassured her that the medication works well with decreasing bleeding. Will f/u US   Start megace 40 mg daily, increase to Bid if no improvement in bleeding F/u TVUS   Thank you for this consult, we will follow along, please call or re-consult with further questions.   For OB/GYN questions, please call the [facility] at [place] Monday - Friday, 8 am - 5 pm: [phone] All other times: [phone]    K. Arvilla Meres, M.D. Attending [place], [facility] for [employer], [place]

## 2020-06-17 NOTE — ED Notes (Signed)
PT reports since she has received one unit of blood her period has started.

## 2020-06-17 NOTE — ED Notes (Signed)
Lunch Tray Ordered @ 1050. 

## 2020-06-17 NOTE — ED Triage Notes (Addendum)
Patient reports SOB with dry cough this evening , no fever or chills . Patient added suicidal ideation but did not specify her plan at triage.

## 2020-06-18 LAB — BASIC METABOLIC PANEL
Anion gap: 8 (ref 5–15)
BUN: 13 mg/dL (ref 6–20)
CO2: 19 mmol/L — ABNORMAL LOW (ref 22–32)
Calcium: 8.8 mg/dL — ABNORMAL LOW (ref 8.9–10.3)
Chloride: 108 mmol/L (ref 98–111)
Creatinine, Ser: 0.88 mg/dL (ref 0.44–1.00)
GFR calc Af Amer: >60 mL/min (ref >60)
GFR calc non Af Amer: >60 mL/min (ref >60)
Glucose, Bld: 123 mg/dL — ABNORMAL HIGH (ref 70–99)
Potassium: 3.9 mmol/L (ref 3.5–5.1)
Sodium: 135 mmol/L (ref 135–145)

## 2020-06-18 LAB — HEMOGLOBIN AND HEMATOCRIT, BLOOD
HCT: 24.2 % — ABNORMAL LOW (ref 36.0–46.0)
HCT: 29.8 % — ABNORMAL LOW (ref 36.0–46.0)
Hemoglobin: 7 g/dL — ABNORMAL LOW (ref 12.0–15.0)
Hemoglobin: 8.7 g/dL — ABNORMAL LOW (ref 12.0–15.0)

## 2020-06-18 LAB — CBC
HCT: 23.9 % — ABNORMAL LOW (ref 36.0–46.0)
Hemoglobin: 6.8 g/dL — CL (ref 12.0–15.0)
MCH: 20.9 pg — ABNORMAL LOW (ref 26.0–34.0)
MCHC: 28.5 g/dL — ABNORMAL LOW (ref 30.0–36.0)
MCV: 73.5 fL — ABNORMAL LOW (ref 80.0–100.0)
Platelets: 273 10*3/uL (ref 150–400)
RBC: 3.25 MIL/uL — ABNORMAL LOW (ref 3.87–5.11)
RDW: 21 % — ABNORMAL HIGH (ref 11.5–15.5)
WBC: 6.8 10*3/uL (ref 4.0–10.5)
nRBC: 0.3 % — ABNORMAL HIGH (ref 0.0–0.2)

## 2020-06-18 LAB — PREPARE RBC (CROSSMATCH)

## 2020-06-18 MED ORDER — PANTOPRAZOLE SODIUM 40 MG PO TBEC: 40.0000 mg | DELAYED_RELEASE_TABLET | Freq: Every day | ORAL | 0 refills | Status: DC

## 2020-06-18 MED ORDER — OXYCODONE-ACETAMINOPHEN 7.5-325 MG PO TABS: 1.0000 | ORAL_TABLET | Freq: Three times a day (TID) | ORAL | 0 refills | Status: AC | PRN

## 2020-06-18 MED ORDER — SODIUM CHLORIDE 0.9% IV SOLUTION: Freq: Once | INTRAVENOUS | Status: DC

## 2020-06-18 MED ORDER — ALBUTEROL SULFATE HFA 108 (90 BASE) MCG/ACT IN AERS: 2.0000 | INHALATION_SPRAY | Freq: Four times a day (QID) | RESPIRATORY_TRACT | 0 refills | Status: DC | PRN

## 2020-06-18 MED ORDER — MEGESTROL ACETATE 40 MG PO TABS: 40.0000 mg | ORAL_TABLET | Freq: Every day | ORAL | 0 refills | Status: DC

## 2020-06-18 MED ORDER — FLUTICASONE-SALMETEROL 100-50 MCG/DOSE IN AEPB: 1.0000 | INHALATION_SPRAY | Freq: Two times a day (BID) | RESPIRATORY_TRACT | 0 refills | Status: DC

## 2020-06-18 MED ORDER — SODIUM CHLORIDE 0.9 % IV SOLN
510.0000 mg | Freq: Once | INTRAVENOUS | Status: AC
  Administered 2020-06-18: 510 mg via INTRAVENOUS
  Filled 2020-06-18: qty 17

## 2020-06-18 MED ORDER — FERROUS SULFATE 325 (65 FE) MG PO TABS: 325.0000 mg | ORAL_TABLET | Freq: Every day | ORAL | 0 refills | Status: DC

## 2020-06-18 MED ORDER — OXYCODONE-ACETAMINOPHEN 7.5-325 MG PO TABS: 1.0000 | ORAL_TABLET | Freq: Three times a day (TID) | ORAL | 0 refills | Status: DC | PRN

## 2020-06-18 NOTE — ED Notes (Signed)
3rd attempted to call report to 2W, no answer.

## 2020-06-18 NOTE — Progress Notes (Signed)
Pt discharged. AVS paperwork reviewed. Pt belongings sent with pt. (clothes, phone, Games developer, and money.)

## 2020-06-18 NOTE — ED Notes (Signed)
This RN called the staffing office to determine if there was a sitter available for this pt; none are available at the moment but one will be sent if one becomes available.

## 2020-06-18 NOTE — ED Notes (Addendum)
This RN paged the Admitting Provider regarding this pts new blood specimen results; no new orders at this time.

## 2020-06-18 NOTE — ED Notes (Signed)
Patient stated she needed something for her nerves because her and her husband were arguing. Attending paged to also notify of HGB. Awaiting return call

## 2020-06-18 NOTE — Discharge Summary (Signed)
Name: Deborah Melendez MRN: [id number] DOB: January 23, 1976 44 y.o. PCP: Charlott Rakes, MD  Date of Admission: 06/17/2020  4:32 AM Date of Discharge: 6/22/20216/22/2021 Attending Physician: No att. providers found  Discharge Diagnosis: 1. Symptomatic Anemia in the setting of menorrhagia Secondary to Uterine Fibroid 2. Mild COPD Exacerbation  3. Depression/Bipolar 1  Discharge Medications: Allergies as of 06/18/2020   No Known Allergies     Medication List    STOP taking these medications   QUEtiapine 25 MG tablet Commonly known as: SEROQUEL     TAKE these medications   albuterol 108 (90 Base) MCG/ACT inhaler Commonly known as: VENTOLIN HFA Inhale 2 puffs into the lungs every 6 (six) hours as needed for wheezing or shortness of breath.   ferrous sulfate 325 (65 FE) MG tablet Take 1 tablet (325 mg total) by mouth daily with breakfast.   Fluticasone-Salmeterol 100-50 MCG/DOSE Aepb Commonly known as: ADVAIR Inhale 1 puff into the lungs 2 (two) times daily.   megestrol 40 MG tablet Commonly known as: MEGACE Take 1 tablet (40 mg total) by mouth daily.   oxyCODONE-acetaminophen 7.5-325 MG tablet Commonly known as: PERCOCET Take 1 tablet by mouth every 8 (eight) hours as needed for up to 3 days for severe pain.   pantoprazole 40 MG tablet Commonly known as: PROTONIX Take 1 tablet (40 mg total) by mouth daily.       Disposition and follow-up:   [name] was discharged from [facility] in Stable condition.  At the hospital follow up visit please address:  1.  1. Symptomatic Anemia in the setting of menorrhagia Secondary to Uterine Fibroid  - Follow up with OBGYN to discuss treatment options  - Continue Megace  2. Mild COPD Exacerbation   - Follow up with PCP 3. Depression/Bipolar 1  - Follow up with PCP   - Consider [place] consultation  2.  Labs / imaging needed at time of follow-up: None  3.  Pending labs/ test needing  follow-up: None  Follow-up Appointments:  Follow-up Information    Charlott Rakes, MD. Schedule an appointment as soon as possible for a visit in 1 week(s).   Specialty: Family Medicine Why: Call to make an appointment in one week's time.  Contact information: [place] [address] [place] for [place] at [facility]. Schedule an appointment as soon as possible for a visit in 2 week(s).   Specialty: Obstetrics and Gynecology Contact information: [place] [id number] [place] Hospital Course by problem list: 1. Symptomatic Anemia in the setting of menorrhagia Secondary to Uterine Fibroid Patient with history of chronic menorrhagia with fibroid, presented to the ED with shortness of breath and found to have a hemoglobin of 4.6 and 5.4 on repeat. She was given 3 units of packed red blood cells with improvement of her hemoglobin of 8.7. She was previously on Megace, but has not used it due to financial constraints. OBGYN was consulted, and patient was started on daily Megace, with alleviation of her symptoms. She was discharged in stable condition, with Megace, with instructions to follow up with OBGYN for further management of her uterine fibroid.   2. Mild COPD Exacerbation:  Patient found to have symptomatic anemia with wheezing on examination. Patient was started on prednisone and dulera. She was on Advair at home  but has not taken her medications due to financial constraints. Patient's lungs were clear to auscultation bilaterally on physical examination. She was discharged in stable condition.   3. Depression/Bipolar 1 Patient with history of depression and bipolar 1 disorder, presented with symptomatic anemia. She was previously on seroquel and trazodone, but has not been taking medications at home due to financial constraints. Patient was observed overnight and  discharged in stable condition with instructions to follow with her PCP.  Discharge Vitals:   BP 134/87   Pulse 85   Temp 98 F (36.7 C) (Oral)   Resp 20   Ht 5\' 4"  (1.626 m)   Wt 72 kg   SpO2 100%   BMI 27.25 kg/m   Pertinent Labs, Studies, and Procedures:   Ref Range & Units 3 d ago  (06/18/20) 3 d ago  (06/18/20) 4 d ago  (06/17/20) 4 d ago  (06/17/20)  WBC 4.0 - 10.5 K/uL 6.8    3.4Low   RBC 3.87 - 5.11 MIL/uL 3.25Low    2.62Low   Hemoglobin 12.0 - 15.0 g/dL 6.8Low Panic  7.0Low CM  5.4Low Panic  4.6Low Panic CM   Comment: REPEATED TO VERIFY     Ref Range & Units 3 d ago  (06/18/20) 3 d ago  (06/18/20) 3 d ago  (06/18/20) 4 d ago  (06/17/20)  Hemoglobin 12.0 - 15.0 g/dL 8.7Low  6.8Low Panic CM  7.0Low CM  5.4Low Panic   Comment: REPEATED TO VERIFY  POST TRANSFUSION SPECIMEN   HCT 36 - 46 % 29.8Low  23.9Low  24.2Low CM  16.0Low    Ref Range & Units 3 d ago  (06/18/20) 4 d ago  (06/17/20) 4 d ago  (06/17/20) 7 mo ago  (11/20/19)   Sodium 135 - 145 mmol/L 135  139  140  140   Potassium 3.5 - 5.1 mmol/L 3.9  4.1  4.5  3.6   Chloride 98 - 111 mmol/L 108  108  111  110   CO2 22 - 32 mmol/L 19Low   22  22   Glucose, Bld 70 - 99 mg/dL 123High  101High CM  92 CM  99   Comment: Glucose reference range applies only to samples taken after fasting for at least 8 hours.  BUN 6 - 20 mg/dL 13  11  10  10    Creatinine, Ser 0.44 - 1.00 mg/dL 0.88  0.90  1.10High  1.00   Calcium 8.9 - 10.3 mg/dL 8.8Low   8.5Low  8.6Low   GFR calc non Af Amer >60 mL/min >60   >60  >60   GFR calc Af Amer >60 mL/min >60   >60  >60   Anion gap 5 - 15 8   7  CM  8 CM    EXAM: TRANSABDOMINAL AND TRANSVAGINAL ULTRASOUND OF PELVIS  DOPPLER ULTRASOUND OF OVARIES  TECHNIQUE: Both transabdominal and transvaginal ultrasound examinations of the pelvis were performed. Transabdominal technique was performed for global imaging of the pelvis including uterus, ovaries,  adnexal regions, and pelvic cul-de-sac.  It was necessary to proceed with endovaginal exam following the transabdominal exam to visualize the endometrium and ovaries. Color and duplex Doppler ultrasound was utilized to evaluate blood flow to the ovaries.  COMPARISON:  Pelvic ultrasound dated 10/04/2019.  FINDINGS: Uterus  Measurements: 16.0 x 10.6 x 14.4 cm = volume: 1275 mL. The uterus is enlarged, heterogeneous, and myomatous.  Endometrium  Distorted and not visualized due to myomatous uterus.  Right ovary  Measurements: 2.3 x 2.2 x 2.4 cm = volume: 6.2 mL. Unremarkable. There is a 2 cm cyst similar to prior ultrasound.  Left ovary  Measurements: 2.9 x 2.7 x 1.9 cm = volume: 7.5 mL. Normal appearance/no adnexal mass.  Pulsed Doppler evaluation of both ovaries demonstrates normal low-resistance arterial and venous waveforms.  Other findings  No abnormal free fluid.  IMPRESSION: 1. Enlarged myomatous uterus. 2. Unremarkable ovaries.  A 2 cm stable right ovarian cyst.  EXAM: CHEST - 2 VIEW  COMPARISON:  November 19, 2019  FINDINGS: The heart size and mediastinal contours are within normal limits. Both lungs are clear. The visualized skeletal structures are unremarkable.  IMPRESSION: No active cardiopulmonary disease.  Discharge Instructions: Discharge Instructions    Call MD for:  difficulty breathing, headache or visual disturbances   Complete by: As directed    Call MD for:  extreme fatigue   Complete by: As directed    Call MD for:  persistant dizziness or light-headedness   Complete by: As directed    Diet - low sodium heart healthy   Complete by: As directed    Increase activity slowly   Complete by: As directed       Signed: Maudie Mercury, MD 06/21/2020, 11:52 AM   Pager: [phone]

## 2020-06-18 NOTE — Progress Notes (Signed)
Gynecology Progress Note  Admission Date: 06/17/2020 Current Date: 06/18/2020 3:43 PM  Tikisha Denette Sieg is a 44 y.o. G6P4 HD#2 admitted for transfusion for symptomatic anemia presumed 2/2 menorrhagia as well as COPD exacerbation.   History complicated by: Patient Active Problem List   Diagnosis Date Noted  . Uterine leiomyoma   . COPD exacerbation ([place])   . Adjustment disorder with mixed anxiety and depressed mood 11/21/2019  . SIRS (systemic inflammatory response syndrome) ([place]) 10/04/2019  . Acute blood loss anemia 10/04/2019  . Symptomatic anemia 04/18/2019  . SOB (shortness of breath) 01/18/2019  . Acute asthma exacerbation 01/18/2019  . Asthma, chronic 12/21/2018  . Bipolar I disorder, most recent episode depressed ([place]) 12/21/2018  . Altered mental status   . Acute respiratory failure with hypoxia ([place]) 10/14/2018  . Bronchitis 10/05/2018  . Acute bronchitis with asthma with acute exacerbation 10/04/2018  . Tobacco use disorder 05/22/2017  . Suicidal ideation   . Cannabis use disorder, severe, dependence ([place]) 01/20/2017  . Cocaine use disorder, severe, dependence ([place]) 08/08/2015    Subjective:  She is feeling much better, states bleeding has stopped. Eager for surgery and states she is ready to go home, planning to move to [place] to be with her mom.  Objective:   Vitals:   06/18/20 1130 06/18/20 1200 06/18/20 1245 06/18/20 1328  BP: 114/79  (!) 134/92 134/87  Pulse: 80 83 100 85  Resp: 10 17 20 20   Temp:   98.6 F (37 C) 98 F (36.7 C)  TempSrc:   Oral Oral  SpO2: 100% 100% 100% 100%  Weight:      Height:        Physical exam: BP 134/87   Pulse 85   Temp 98 F (36.7 C) (Oral)   Resp 20   Ht 5\' 4"  (1.626 m)   Wt 72 kg   SpO2 100%   BMI 27.25 kg/m  CONSTITUTIONAL: Well-developed, well-nourished female in no acute distress.  HENT:  Normocephalic, atraumatic, External right and left ear normal. Oropharynx is clear and moist EYES:  Conjunctivae and EOM are normal. Pupils are equal, round, and reactive to light. No scleral icterus.  NECK: Normal range of motion, supple, no masses.  Normal thyroid.  SKIN: Skin is warm and dry. No rash noted. Not diaphoretic. No erythema. No pallor. NEUROLOGIC: Alert and oriented to person, place, and time. Normal reflexes, muscle tone coordination. No cranial nerve deficit noted. PSYCHIATRIC: Normal mood and affect. Normal behavior. Normal judgment and thought content. CARDIOVASCULAR: Normal heart rate noted RESPIRATORY: Effort normal, no problems with respiration noted. ABDOMEN: no distention noted.   PELVIC: deferred MUSCULOSKELETAL: Normal range of motion. No tenderness.  No cyanosis, clubbing, or edema.    UOP: voiding spontaneously  Labs  Recent Labs  Lab 06/17/20 0445 06/17/20 0814 06/18/20 0034 06/18/20 0237 06/18/20 1448  WBC 3.4*  --   --  6.8  --   HGB 4.6*   < > 7.0* 6.8* 8.7*  HCT 18.4*   < > 24.2* 23.9* 29.8*  PLT 361  --   --  273  --    < > = values in this interval not displayed.     Assessment & Plan:   Patient is 44 y.o. Valentijn.[place]ake HD#2 transfusion for symptomatic anemia presumed 2/2 menorrhagia as well as COPD exacerbation. Enlarged fibroid uterus on ultrasound, will likely need hysterectomy. Recommend discharge home on Megace and reviewed with patient that she will need to f/u in office  for discussion of management, likely surgical planning. She verbalizes understanding of the above and states she would like surgery done prior to her move to [place] as she is not planning on staying in [place] for more than a month. I reviewed that would depend on scheduling but we will discuss in office. She verbalizes understanding of the plan.  Home on Megace 40 daily (may increase to BID prn) F/u in office   Thank you for this consult, we will sign off. Please call or reconsult with any questions.    Feliz Beam, M.D. Attending Center for [employer]  Fish farm manager)

## 2020-06-18 NOTE — Progress Notes (Signed)
Date: 06/18/2020  Patient name: Deborah Melendez  Medical record number: [id number]  Date of birth: 10-01-1976   I have seen and evaluated Deborah Melendez and discussed their care with the Residency Team.  In brief, patient is a 44 year old female with a past medical history of depression, type I bipolar disorder, polysubstance abuse, bronchitis/COPD, anemia secondary to fibroids who presented to the ED with worsening shortness of breath over the last couple of weeks.  Patient states that approximately 2 weeks ago she noted new onset shortness of breath which is progressively worsened over the last couple weeks.  Patient states that he has noted associated wheezing in the last couple of days.  Patient states that she has not been able to afford her outpatient inhalers and has been off treatment for a while for COPD.  Patient also complains of associated fatigue and lightheadedness.  Of note, patient does have a history of anemia secondary to fibroids.  Her hemoglobin is found to be 4.6 and then 5.4 on repeat in the ED.  No chest pain, no palpitations, no diaphoresis, no syncope, no focal weakness, no tingling or numbness, no diarrhea, no abdominal pain, no fevers or chills.  Today patient states that she feels much better but still has some mild abdominal pain which she states is chronic secondary to her fibroids.  PMHx, Fam Hx, and/or Soc Hx : As per resident admit note  Vitals:   06/18/20 1115 06/18/20 1130  BP: (!) 113/94 114/79  Pulse: 80 80  Resp: 17 10  Temp: 98 F (36.7 C)   SpO2: 100% 100%   General: Awake, alert, oriented x3, NAD CVS: Regular rate and rhythm, normal heart sounds Lungs: CTA bilaterally Abdomen: Soft, nontender, nondistended, normoactive bowel sounds Extremities: No edema noted, nontender to palpation Psych: Normal mood and affect HEENT: Normocephalic, atraumatic Skin: Warm and dry  Assessment and Plan: I have seen and evaluated the patient as  outlined above. I agree with the formulated Assessment and Plan as detailed in the residents' note, with the following changes:   1.  Acute blood loss anemia secondary to uterine fibroids: -Patient presented to the ED with worsening shortness of breath over the last couple weeks and was found to have a hemoglobin of 4.6 which is 5.4 on repeat in the setting of a history of chronic anemia secondary to fibroids.  Transvaginal ultrasound done here showed a large heterogeneous myomatous uterus. -OB/GYN follow-up recommendations appreciated.  We will start the patient on Megace 40 mg daily and increase to twice daily if no improvement -Patient received 2 units PRBC and her hemoglobin is currently 6.8.  We will transfuse an additional unit of PRBC today -Patient need to follow-up with OB/GYN as an outpatient for possible hysterectomy -Patient also noted to have iron deficiency on blood work likely secondary to her ongoing bleeding.  Will transfuse Feraheme today. -Patient will need TOC consult for medication assistance -No further work-up at this time. -Patient will likely be stable for DC home today -She will need to follow-up with her PCP as well as OB/GYN as an outpatient.  2.  Acute COPD exacerbation: -Patient also noted to have wheezing on admission in the setting of a history of COPD.  She is had difficulty affording her medications as an outpatient. -We will start the patient on Dulera here -Patient is also started on prednisone 40 mg daily to complete a 5-day course -Patient lungs clear to auscultation today.  No further work-up at this time. -  Patient stable for DC home today  Aldine Contes, MD 6/22/202111:59 AM

## 2020-06-18 NOTE — ED Notes (Signed)
2nd attempt to call report to 2W rm 17 RN.

## 2020-06-18 NOTE — ED Notes (Signed)
Attempted to call report to 2W rm 17 RN.

## 2020-06-18 NOTE — ED Notes (Signed)
[place] sandwich and Coke given as requested

## 2020-06-18 NOTE — ED Notes (Signed)
Lunch Tray Ordered @ 1048. °

## 2020-06-18 NOTE — Discharge Instructions (Signed)
To Deborah Melendez,  It was a pleasure working with your during your stay at [facility]. During your time at the hospital, you were diagnosed with anemia. You received blood and an iron infusion during your stay at the hospital. Your blood loss was likely due to your fibroid, for which OBGYN saw you. Please follow up with them at the address below: [place] at [facility] [place], [place] [address] [phone]   Additionally, please follow up with your primary care provider to monitor your blood levels. Please come back to be reevaluated if you experience shortness of breath, fatigue, light headedness, or have heart palpitations.     Anemia  Anemia is a condition in which you do not have enough red blood cells or hemoglobin. Hemoglobin is a substance in red blood cells that carries oxygen. When you do not have enough red blood cells or hemoglobin (are anemic), your body cannot get enough oxygen and your organs may not work properly. As a result, you may feel very tired or have other problems. What are the causes? Common causes of anemia include:  Excessive bleeding. Anemia can be caused by excessive bleeding inside or outside the body, including bleeding from the intestine or from periods in women.  Poor nutrition.  Long-lasting (chronic) kidney, thyroid, and liver disease.  Bone marrow disorders.  Cancer and treatments for cancer.  HIV (human immunodeficiency virus) and AIDS (acquired immunodeficiency syndrome).  Treatments for HIV and AIDS.  Spleen problems.  Blood disorders.  Infections, medicines, and autoimmune disorders that destroy red blood cells. What are the signs or symptoms? Symptoms of this condition include:  Minor weakness.  Dizziness.  Headache.  Feeling heartbeats that are irregular or faster than normal (palpitations).  Shortness of breath, especially with exercise.  Paleness.  Cold  sensitivity.  Indigestion.  Nausea.  Difficulty sleeping.  Difficulty concentrating. Symptoms may occur suddenly or develop slowly. If your anemia is mild, you may not have symptoms. How is this diagnosed? This condition is diagnosed based on:  Blood tests.  Your medical history.  A physical exam.  Bone marrow biopsy. Your health care provider may also check your stool (feces) for blood and may do additional testing to look for the cause of your bleeding. You may also have other tests, including:  Imaging tests, such as a CT scan or MRI.  Endoscopy.  Colonoscopy. How is this treated? Treatment for this condition depends on the cause. If you continue to lose a lot of blood, you may need to be treated at a hospital. Treatment may include:  Taking supplements of iron, vitamin O29, or folic acid.  Taking a hormone medicine (erythropoietin) that can help to stimulate red blood cell growth.  Having a blood transfusion. This may be needed if you lose a lot of blood.  Making changes to your diet.  Having surgery to remove your spleen. Follow these instructions at home:  Take over-the-counter and prescription medicines only as told by your health care provider.  Take supplements only as told by your health care provider.  Follow any diet instructions that you were given.  Keep all follow-up visits as told by your health care provider. This is important. Contact a health care provider if:  You develop new bleeding anywhere in the body. Get help right away if:  You are very weak.  You are short of breath.  You have pain in your abdomen or chest.  You are dizzy  or feel faint.  You have trouble concentrating.  You have bloody or black, tarry stools.  You vomit repeatedly or you vomit up blood. Summary  Anemia is a condition in which you do not have enough red blood cells or enough of a substance in your red blood cells that carries oxygen  (hemoglobin).  Symptoms may occur suddenly or develop slowly.  If your anemia is mild, you may not have symptoms.  This condition is diagnosed with blood tests as well as a medical history and physical exam. Other tests may be needed.  Treatment for this condition depends on the cause of the anemia. This information is not intended to replace advice given to you by your health care provider. Make sure you discuss any questions you have with your health care provider. Document Revised: 11/26/2017 Document Reviewed: 01/15/2017 Elsevier Patient Education  Deborah Melendez.

## 2020-06-18 NOTE — Progress Notes (Signed)
Subjective:  O/N Events: None   Ms. Boyar was seen at bedside this AM.   Patient states that she continues to have some abdominal pain. She states that she is frustrated that she can't get her hysterectomy while she is in the hospital. She is concerned about getting her prescriptions in the OP setting. Otherwise, she states that she is feeling much better after getting the transfusion. We discussed iron replacement and another unit today, and discussions with TOC team to discuss financial options to cover her medications.    Objective:  Vital signs in last 24 hours: Vitals:   06/17/20 2058 06/18/20 0035 06/18/20 0251 06/18/20 0716  BP: (!) 129/92 124/82 92/60 126/90  Pulse: 95 91 91 95  Resp: 16 16 16 17   Temp: 97.9 F (36.6 C)   98.2 F (36.8 C)  TempSrc: Oral   Oral  SpO2: 99% 100% 100% 100%  Weight:      Height:       Physical Exam Vitals reviewed.  Constitutional:      General: She is not in acute distress.    Appearance: She is not ill-appearing, toxic-appearing or diaphoretic.  Cardiovascular:     Rate and Rhythm: Normal rate and regular rhythm.     Heart sounds: No murmur heard.  No friction rub. No gallop.   Pulmonary:     Effort: Pulmonary effort is normal.     Breath sounds: Normal breath sounds. No decreased breath sounds, wheezing, rhonchi or rales.  Abdominal:     General: Bowel sounds are normal.     Palpations: Abdomen is soft.  Musculoskeletal:     Right lower leg: No tenderness. No edema.     Left lower leg: No tenderness. No edema.  Neurological:     Mental Status: She is alert and oriented to person, place, and time.     LABS:  CBC Latest Ref Rng & Units 06/18/2020 06/18/2020 06/17/2020  WBC 4.0 - 10.5 K/uL 6.8 - -  Hemoglobin 12.0 - 15.0 g/dL 6.8(LL) 7.0(L) 5.4(LL)  Hematocrit 36 - 46 % 23.9(L) 24.2(L) 16.0(L)  Platelets 150 - 400 K/uL 273 - -    BMP Latest Ref Rng & Units 06/18/2020 06/17/2020 06/17/2020  Glucose 70 - 99 mg/dL 123(H) 101(H)  92  BUN 6 - 20 mg/dL 13 11 10   Creatinine 0.44 - 1.00 mg/dL 0.88 0.90 1.10(H)  BUN/Creat Ratio 9 - 23 - - -  Sodium 135 - 145 mmol/L 135 139 140  Potassium 3.5 - 5.1 mmol/L 3.9 4.1 4.5  Chloride 98 - 111 mmol/L 108 108 111  CO2 22 - 32 mmol/L 19(L) - 22  Calcium 8.9 - 10.3 mg/dL 8.8(L) - 8.5(L)   TVUS:  FINDINGS: Uterus  Measurements: 16.0 x 10.6 x 14.4 cm = volume: 1275 mL. The uterus is enlarged, heterogeneous, and myomatous.  Endometrium  Distorted and not visualized due to myomatous uterus.  Right ovary  Measurements: 2.3 x 2.2 x 2.4 cm = volume: 6.2 mL. Unremarkable. There is a 2 cm cyst similar to prior ultrasound.  Left ovary  Measurements: 2.9 x 2.7 x 1.9 cm = volume: 7.5 mL. Normal appearance/no adnexal mass.  Pulsed Doppler evaluation of both ovaries demonstrates normal low-resistance arterial and venous waveforms.  Other findings  No abnormal free fluid.  IMPRESSION: 1. Enlarged myomatous uterus. 2. Unremarkable ovaries.  A 2 cm stable right ovarian cyst.  Assessment/Plan:  Active Problems:   Symptomatic anemia  Symptomatic Anemia Uterine Fibroid:  Patient, with  a history of chronic menorrhagia and fibroid, presented to the ED with SOB, and a hgb of 4.6 and 5.4 on repeat. She had a similar presentation/admission in 09/2019 where she was found to have large fibroid 13.4 cm arising from the fundus. She was seen by OB/GYN inpatient and was to follow up in the outpatient setting for her fibroid. Due to financial constraints, she has not been able to take her Megace or other home medications. This morning, her Hgb was 6.8 and another unit of blood was ordered. 3 units total since her hospital course. Her iron studies are also consistent with iron deficiency, likely in the setting of her chronic menorrhagia. Will receive Feraheme infusion before discharge.  - Appreciate OB/GYN's recommendations. - Transfusing 1 Unit PRBC - Feraheme 510 today - TOC  consult for medication management.  - Trend CBC - F/U H&H - Continue Megace 40 mg QD  - Ferritin: 4 - B12: 195 - Folate: 5.5 (L) - Iron: 18 - TIBC: 601    COPD in mild exacerbation: History of recurrent bronchitis. Intubated in 2020. Smokes .75 packs per day. In addition to Anemia, patient noted to have wheezing on exam. Has difficultly affording home meds. Previously on Advair) - Restart inhalers, Dulera here - Prednisone 40 mg Daily - PRN Duonebs - PRN Albuterol  Depression Bipolar 1: Previously on Seroquel and trazodone. Has not been taking due to cost. Endorses depression. Denies SI. - Hold for now as she is not taking at home  Prior to Admission Living Arrangement: Home Anticipated Discharge Location: Home  Barriers to Discharge: Continued Medical Work Up Dispo: Anticipated discharge in approximately today, after feraheme infusion and H&H >7.   Maudie Mercury, MD 06/18/2020, 7:20 AM Pager: [phone] After 5pm on weekdays and 1pm on weekends: On Call pager [phone]

## 2020-06-18 NOTE — ED Notes (Signed)
Patient asleep.

## 2020-06-19 ENCOUNTER — Telehealth: Payer: Self-pay

## 2020-06-19 LAB — TYPE AND SCREEN
ABO/RH(D): O POS
Antibody Screen: NEGATIVE
Unit division: 0
Unit division: 0
Unit division: 0

## 2020-06-19 LAB — BPAM RBC
Blood Product Expiration Date: 202107132359
Blood Product Expiration Date: 202107142359
Blood Product Expiration Date: 202107152359
ISSUE DATE / TIME: 202106211155
ISSUE DATE / TIME: 202106211823
ISSUE DATE / TIME: 202106221037
Unit Type and Rh: 5100
Unit Type and Rh: 5100
Unit Type and Rh: 5100

## 2020-06-19 MED FILL — MEGESTROL 40 MG TABLET: 40 | 30 days supply | Qty: 30 | Fill #0

## 2020-06-19 MED FILL — PANTOPRAZOLE SOD DR 40 MG T: 40 | 30 days supply | Qty: 30 | Fill #0

## 2020-06-19 MED FILL — $VENTOLIN HFA 18G INHALER: 108 (90 BAS | 25 days supply | Qty: 18 | Fill #0

## 2020-06-19 MED FILL — $ADVAIR 100/50 MCG INHALER: 100-50 | 30 days supply | Qty: 60 | Fill #0

## 2020-06-19 NOTE — Telephone Encounter (Signed)
Transition Care Management Follow-up Telephone Call Date of discharge and from where: 06/18/2020, [facility]   Call placed to patient # [phone], message left with call back requested to this CM. Call placed to # [phone], there was just static and noise, no option to leave a message.  Patient needs to schedule HFU appointment.   She has the phone number for [facility] on her AVS

## 2020-06-20 ENCOUNTER — Telehealth: Payer: Self-pay

## 2020-06-20 NOTE — Telephone Encounter (Signed)
Transition Care Management Follow-up Telephone Call Attempt # 2  Date of discharge and from where: 06/18/2020, [facility]   Call placed to patient # [phone], message left with call back requested to this CM. Call placed to # [phone], there was just static and noise, no option to leave a message.  Patient needs to schedule HFU appointment.   She has the phone number for [facility] on her AVS.  Letter also sent to patient requesting that she contact this office to schedule an appointment

## 2020-06-21 ENCOUNTER — Emergency Department
Admission: EM | Admit: 2020-06-21 | Discharge: 2020-06-21 | Disposition: A | Discharge status: Home / Self Care | Payer: Self-pay | Attending: Student in an Organized Health Care Education/Training Program | Admitting: Student in an Organized Health Care Education/Training Program

## 2020-06-21 ENCOUNTER — Inpatient Hospital Stay
Admission: RE | Admit: 2020-06-21 | Disposition: A | Discharge status: Home / Self Care | Payer: Self-pay | Source: Intra-hospital | Admitting: Psychiatry

## 2020-06-21 ENCOUNTER — Other Ambulatory Visit: Payer: Self-pay

## 2020-06-21 DIAGNOSIS — F329 Major depressive disorder, single episode, unspecified: Secondary | ICD-10-CM | POA: Insufficient documentation

## 2020-06-21 DIAGNOSIS — F17210 Nicotine dependence, cigarettes, uncomplicated: Secondary | ICD-10-CM | POA: Insufficient documentation

## 2020-06-21 DIAGNOSIS — J449 Chronic obstructive pulmonary disease, unspecified: Secondary | ICD-10-CM | POA: Insufficient documentation

## 2020-06-21 DIAGNOSIS — R45851 Suicidal ideations: Secondary | ICD-10-CM

## 2020-06-21 DIAGNOSIS — Z7951 Long term (current) use of inhaled steroids: Secondary | ICD-10-CM | POA: Insufficient documentation

## 2020-06-21 LAB — URINALYSIS, COMPLETE (UACMP) WITH MICROSCOPIC
Bacteria, UA: NONE SEEN
Bilirubin Urine: NEGATIVE
Glucose, UA: NEGATIVE mg/dL
Ketones, ur: NEGATIVE mg/dL
Leukocytes,Ua: NEGATIVE
Nitrite: NEGATIVE
Protein, ur: 100 mg/dL — AB
RBC / HPF: >50 RBC/hpf — ABNORMAL HIGH (ref 0–5)
Specific Gravity, Urine: 1.017 (ref 1.005–1.030)
pH: 6 (ref 5.0–8.0)

## 2020-06-21 LAB — COMPREHENSIVE METABOLIC PANEL WITH GFR
ALT: 11 U/L (ref 0–44)
AST: 19 U/L (ref 15–41)
Albumin: 4.1 g/dL (ref 3.5–5.0)
Alkaline Phosphatase: 56 U/L (ref 38–126)
Anion gap: 10 (ref 5–15)
BUN: 10 mg/dL (ref 6–20)
CO2: 22 mmol/L (ref 22–32)
Calcium: 8.8 mg/dL — ABNORMAL LOW (ref 8.9–10.3)
Chloride: 109 mmol/L (ref 98–111)
Creatinine, Ser: 0.9 mg/dL (ref 0.44–1.00)
GFR calc Af Amer: >60 mL/min
GFR calc non Af Amer: >60 mL/min
Glucose, Bld: 89 mg/dL (ref 70–99)
Potassium: 3.8 mmol/L (ref 3.5–5.1)
Sodium: 141 mmol/L (ref 135–145)
Total Bilirubin: 0.9 mg/dL (ref 0.3–1.2)
Total Protein: 7.4 g/dL (ref 6.5–8.1)

## 2020-06-21 LAB — CBC
HCT: 30.7 % — ABNORMAL LOW (ref 36.0–46.0)
Hemoglobin: 9.3 g/dL — ABNORMAL LOW (ref 12.0–15.0)
MCH: 22.4 pg — ABNORMAL LOW (ref 26.0–34.0)
MCHC: 30.3 g/dL (ref 30.0–36.0)
MCV: 74 fL — ABNORMAL LOW (ref 80.0–100.0)
Platelets: 237 10*3/uL (ref 150–400)
RBC: 4.15 MIL/uL (ref 3.87–5.11)
RDW: 23.4 % — ABNORMAL HIGH (ref 11.5–15.5)
WBC: 8.1 10*3/uL (ref 4.0–10.5)
nRBC: 0.6 % — ABNORMAL HIGH (ref 0.0–0.2)

## 2020-06-21 LAB — URINE DRUG SCREEN, QUALITATIVE (ARMC ONLY)
Amphetamines, Ur Screen: NOT DETECTED
Barbiturates, Ur Screen: NOT DETECTED
Benzodiazepine, Ur Scrn: NOT DETECTED
Cannabinoid 50 Ng, Ur ~~LOC~~: POSITIVE — AB
Cocaine Metabolite,Ur ~~LOC~~: NOT DETECTED
MDMA (Ecstasy)Ur Screen: NOT DETECTED
Methadone Scn, Ur: NOT DETECTED
Opiate, Ur Screen: NOT DETECTED
Phencyclidine (PCP) Ur S: NOT DETECTED
Tricyclic, Ur Screen: NOT DETECTED

## 2020-06-21 LAB — ETHANOL: Alcohol, Ethyl (B): <10 mg/dL (ref <10)

## 2020-06-21 MED ORDER — LORAZEPAM 2 MG PO TABS
2.0000 mg | ORAL_TABLET | Freq: Once | ORAL | Status: AC
  Administered 2020-06-21: 2 mg via ORAL
  Filled 2020-06-21: qty 1

## 2020-06-21 MED ORDER — DULOXETINE HCL 30 MG PO CPEP: 30.0000 mg | ORAL_CAPSULE | Freq: Every day | ORAL | 2 refills | Status: DC

## 2020-06-21 MED ORDER — OLANZAPINE 10 MG PO TABS: 10.0000 mg | ORAL_TABLET | Freq: Every day | ORAL | Status: DC

## 2020-06-21 MED ORDER — OLANZAPINE 10 MG PO TABS: 10.0000 mg | ORAL_TABLET | Freq: Every day | ORAL | 0 refills | Status: DC

## 2020-06-21 MED ORDER — DULOXETINE HCL 30 MG PO CPEP
30.0000 mg | ORAL_CAPSULE | Freq: Every day | ORAL | Status: DC
  Administered 2020-06-21: 30 mg via ORAL
  Filled 2020-06-21: qty 1

## 2020-06-21 NOTE — ED Notes (Signed)
Pt provided breakfast tray.

## 2020-06-21 NOTE — ED Notes (Signed)
Lunch tray placed in room. Pt comfortable and resting at this time.

## 2020-06-21 NOTE — Consult Note (Signed)
[facility] Face-to-Face Psychiatry Consult   Reason for Consult:  OOC and psychotic manic issues  Voluintary status  Referring Physician:  ED MD  Patient Identification: Deborah Melendez MRN:  [id number] Principal Diagnosis: <principal problem not specified> Bipolar disorder mixed  Diagnosis:  Active Problems:   * No active hospital problems. * Patient is out of medications, she has return of symptoms she wants to get meds and then go home.  Voluntary status   Total Time spent with patient:   35-40 minutes    Subjective:   Deborah Melendez is a 44 y.o. female patient admitted with   Bipolar disorder Mixed with psychosis  Past Substance dependence  Personality Disorder Narcissistic borderline antisocial  She is here for medications and then wants to leave   HPI:  Patient complains of severe anxiety worry, nervousness, outbursts, ups and downs, mood swings, lability lack of sleep, irritability anger frustration along with major depression and anxiety  Depressed mood, crying spells, hopeless helpless feelings, lack of worth, motivation, concentration attention, energy enthusiasm, weight gain.  She was previously on medications months ago but does not recall the names   Past Psychiatric History:  Admitted many times last one she was in the ER six months ago.   Substance  Dependence ---Use of Marijuana currently, polysubstance's in the past she says   Court and Legal issues --none at this time     Risk to Self:  none  Risk to Others:  none  Prior Inpatient Therapy:   multiple past admits for similar issues  Prior Outpatient Therapy:  No recent day treatment IOP, supportive groups, community mental health ---    Past Medical History:  Past Medical History:  Diagnosis Date  . Asthma   . Bipolar 1 disorder, depressed, severe ([place]) 02/04/2017  . Cocaine abuse with cocaine-induced mood disorder ([place]) 07/27/2017  . COPD (chronic obstructive pulmonary disease) ([place])   .  Depression   . Homicidal ideations   . Manic behavior ([place])   . MDD (major depressive disorder), recurrent severe, without psychosis ([place]) 08/08/2015  . Substance or medication-induced bipolar and related disorder with onset during intoxication ([place]) 09/08/2016  . Suicidal ideation     Past Surgical History:  Procedure Laterality Date  . FINGER SURGERY     Family History:  Family History  Problem Relation Age of Onset  . Diabetes Mother   . Hypertension Mother   . Drug abuse Father   . Schizophrenia Maternal Aunt    Family Psychiatric  History:   She is too agitated to share this--info at this medications     Social History:   Currently broke up with boyfriend and wants to return to her home in [place].  She is upset and easily angered due to the pain of separation.  Currently not working has to clarify her disability     Social History   Substance and Sexual Activity  Alcohol Use Yes  . Alcohol/week: 1.0 standard drink  . Types: 1 Cans of beer per week     Social History   Substance and Sexual Activity  Drug Use Yes  . Frequency: 5.0 times per week  . Types: Cocaine, Marijuana   Comment: +Cocaine and THC    Social History   Socioeconomic History  . Marital status: Significant Other    Spouse name: Not on file  . Number of children: Not on file  . Years of education: Not on file  . Highest education level: Not on file  Occupational History  . Occupation: Unemploed  Tobacco Use  . Smoking status: Current Every Day Smoker    Packs/day: 1.00    Types: Cigarettes  . Smokeless tobacco: Never Used  Vaping Use  . Vaping Use: Never used  Substance and Sexual Activity  . Alcohol use: Yes    Alcohol/week: 1.0 standard drink    Types: 1 Cans of beer per week  . Drug use: Yes    Frequency: 5.0 times per week    Types: Cocaine, Marijuana    Comment: +Cocaine and THC  . Sexual activity: Yes    Birth control/protection: None  Other Topics Concern  . Not  on file  Social History Narrative   Pt lives in [place] with partner/boyfriend.  She is unemployed.  Pt is supposed to be followed by [facility], but she has not been in several months, and she is off medication.   Social Determinants of Health   Financial Resource Strain:   . Difficulty of Paying Living Expenses:   Food Insecurity:   . Worried About Charity fundraiser in the Last Year:   . Arboriculturist in the Last Year:   Transportation Needs:   . Film/video editor (Medical):   Marland Kitchen Lack of Transportation (Non-Medical):   Physical Activity:   . Days of Exercise per Week:   . Minutes of Exercise per Session:   Stress:   . Feeling of Stress :   Social Connections:   . Frequency of Communication with Friends and Family:   . Frequency of Social Gatherings with Friends and Family:   . Attends Religious Services:   . Active Member of Clubs or Organizations:   . Attends Archivist Meetings:   Marland Kitchen Marital Status:    Additional Social History:    Allergies:  No Known Allergies  Labs:  Results for orders placed or performed during the hospital encounter of 06/21/20 (from the past 48 hour(s))  CBC     Status: Abnormal   Collection Time: 06/21/20  7:18 AM  Result Value Ref Range   WBC 8.1 4.0 - 10.5 K/uL   RBC 4.15 3.87 - 5.11 MIL/uL   Hemoglobin 9.3 (L) 12.0 - 15.0 g/dL   HCT 30.7 (L) 36 - 46 %   MCV 74.0 (L) 80.0 - 100.0 fL   MCH 22.4 (L) 26.0 - 34.0 pg   MCHC 30.3 30.0 - 36.0 g/dL   RDW 23.4 (H) 11.5 - 15.5 %   Platelets 237 150 - 400 K/uL   nRBC 0.6 (H) 0.0 - 0.2 %    Comment: Performed at [facility], [address] [place], [place] [address]  Comprehensive metabolic panel     Status: Abnormal   Collection Time: 06/21/20  7:18 AM  Result Value Ref Range   Sodium 141 135 - 145 mmol/L   Potassium 3.8 3.5 - 5.1 mmol/L   Chloride 109 98 - 111 mmol/L   CO2 22 22 - 32 mmol/L   Glucose, Bld 89 70 - 99 mg/dL    Comment: Glucose reference range  applies only to samples taken after fasting for at least 8 hours.   BUN 10 6 - 20 mg/dL   Creatinine, Ser 0.90 0.44 - 1.00 mg/dL   Calcium 8.8 (L) 8.9 - 10.3 mg/dL   Total Protein 7.4 6.5 - 8.1 g/dL   Albumin 4.1 3.5 - 5.0 g/dL   AST 19 15 - 41 U/L   ALT 11 0 -  44 U/L   Alkaline Phosphatase 56 38 - 126 U/L   Total Bilirubin 0.9 0.3 - 1.2 mg/dL   GFR calc non Af Amer >60 >60 mL/min   GFR calc Af Amer >60 >60 mL/min   Anion gap 10 5 - 15    Comment: Performed at [facility], [address] [place], [place] [address]  Ethanol     Status: None   Collection Time: 06/21/20  7:18 AM  Result Value Ref Range   Alcohol, Ethyl (B) <10 <10 mg/dL    Comment: (NOTE) Lowest detectable limit for serum alcohol is 10 mg/dL.  For medical purposes only. Performed at [facility], [address] [place], [place] [address]   Urinalysis, Complete w Microscopic     Status: Abnormal   Collection Time: 06/21/20  7:18 AM  Result Value Ref Range   Color, Urine YELLOW (A) YELLOW   APPearance HAZY (A) CLEAR   Specific Gravity, Urine 1.017 1.005 - 1.030   pH 6.0 5.0 - 8.0   Glucose, UA NEGATIVE NEGATIVE mg/dL   Hgb urine dipstick LARGE (A) NEGATIVE   Bilirubin Urine NEGATIVE NEGATIVE   Ketones, ur NEGATIVE NEGATIVE mg/dL   Protein, ur 100 (A) NEGATIVE mg/dL   Nitrite NEGATIVE NEGATIVE   Leukocytes,Ua NEGATIVE NEGATIVE   RBC / HPF >50 (H) 0 - 5 RBC/hpf   WBC, UA 6-10 0 - 5 WBC/hpf   Bacteria, UA NONE SEEN NONE SEEN   Squamous Epithelial / LPF 6-10 0 - 5   Mucus PRESENT     Comment: Performed at [facility], [address], [place], [place] [address]  Urine Drug Screen, Qualitative ([facility] only)     Status: Abnormal   Collection Time: 06/21/20  7:18 AM  Result Value Ref Range   Tricyclic, Ur Screen NONE DETECTED NONE DETECTED   Amphetamines, Ur Screen NONE DETECTED NONE DETECTED   MDMA (Ecstasy)Ur Screen NONE DETECTED NONE DETECTED   Cocaine Metabolite,Ur [place] NONE  DETECTED NONE DETECTED   Opiate, Ur Screen NONE DETECTED NONE DETECTED   Phencyclidine (PCP) Ur S NONE DETECTED NONE DETECTED   Cannabinoid 50 Ng, Ur [place] POSITIVE (A) NONE DETECTED   Barbiturates, Ur Screen NONE DETECTED NONE DETECTED   Benzodiazepine, Ur Scrn NONE DETECTED NONE DETECTED   Methadone Scn, Ur NONE DETECTED NONE DETECTED    Comment: (NOTE) Tricyclics + metabolites, urine    Cutoff 1000 ng/mL Amphetamines + metabolites, urine  Cutoff 1000 ng/mL MDMA (Ecstasy), urine              Cutoff 500 ng/mL Cocaine Metabolite, urine          Cutoff 300 ng/mL Opiate + metabolites, urine        Cutoff 300 ng/mL Phencyclidine (PCP), urine         Cutoff 25 ng/mL Cannabinoid, urine                 Cutoff 50 ng/mL Barbiturates + metabolites, urine  Cutoff 200 ng/mL Benzodiazepine, urine              Cutoff 200 ng/mL Methadone, urine                   Cutoff 300 ng/mL  The urine drug screen provides only a preliminary, unconfirmed analytical test result and should not be used for non-medical purposes. Clinical consideration and professional judgment should be applied to any positive drug screen result due to possible interfering substances. A more specific  alternate chemical method must be used in order to obtain a confirmed analytical result. Gas chromatography / mass spectrometry (GC/MS) is the preferred confirm atory method. Performed at [facility], [address] [place], [place], [place] [address]     Current Facility-Administered Medications  Medication Dose Route Frequency Provider Last Rate Last Admin  . DULoxetine (CYMBALTA) DR capsule 30 mg  30 mg Oral Daily Eulas Post, MD      . LORazepam (ATIVAN) tablet 2 mg  2 mg Oral Once Eulas Post, MD      . OLANZapine [facility]) tablet 10 mg  10 mg Oral QHS Eulas Post, MD       Current Outpatient Medications  Medication Sig Dispense Refill  . albuterol (VENTOLIN HFA) 108 (90 Base) MCG/ACT inhaler Inhale 2 puffs  into the lungs every 6 (six) hours as needed for wheezing or shortness of breath. 8 g 0  . Fluticasone-Salmeterol (ADVAIR) 100-50 MCG/DOSE AEPB Inhale 1 puff into the lungs 2 (two) times daily. 60 each 0  . oxyCODONE-acetaminophen (PERCOCET) 7.5-325 MG tablet Take 1 tablet by mouth every 8 (eight) hours as needed for up to 3 days for severe pain. 9 tablet 0  . pantoprazole (PROTONIX) 40 MG tablet Take 1 tablet (40 mg total) by mouth daily. 30 tablet 0  . ferrous sulfate 325 (65 FE) MG tablet Take 1 tablet (325 mg total) by mouth daily with breakfast. 30 tablet 0  . megestrol (MEGACE) 40 MG tablet Take 1 tablet (40 mg total) by mouth daily. 30 tablet 0    Musculoskeletal: Strength & Muscle Tone: normal   Gait & Station: normal  Patient leans: normal   Psychiatric Specialty Exam: Physical Exam  Review of Systems  Blood pressure (!) 168/105, pulse (!) 107, temperature 97.7 F (36.5 C), temperature source Oral, resp. rate 20, height 5\' 4"  (1.626 m), weight 68 kg, SpO2 100 %.Body mass index is 25.75 kg/m.     General Appearance: obese AA female    Rapport --poor, shouting angry edgy Eye contact fair   Oriented to person place and time  Consciousness not clouded or fluctuant Concentration and attention lowered due to anger Speech --loud pressured rude  Poor manners poor social skills Judgement insight reliability all poor SI and HI ----none contracts for safety Thought process --no frank LOA FOI or related symptoms Thought content --angry edgy themes, BF breakup issues  Movements --no gross tics, movements, shakes or tremors noted Abstraction somewhat concrete Memory --remote recent and immediate intact through general questions Fund of knowledge and intelligence --below average Aims --not done Cognition --no new changes Sleep --decreased due to lack of medications                                                                Treatment Plan  Summary:  Patient was started on Cymbalta 30 daily  And was given Zyprexa 10 mg po qhs  She was then released today at her own request on voluntary status     Disposition: discharge home this afternoon   TTS helping her get back to her home in [place] and followup with community mental health there  Eulas Post, MD 06/21/2020 11:02 AM

## 2020-06-21 NOTE — ED Provider Notes (Signed)
[facility] Emergency Department Provider Note ____________________________________________   First MD Initiated Contact with Patient 06/21/20 [phone]     (approximate)  I have reviewed the triage vital signs and the nursing notes.   HISTORY  Chief Complaint Suicidal    HPI Deborah Melendez is a 44 y.o. female with PMH as noted below including depression, COPD and anemia who presents with worsening depression over the last few weeks, gradual onset, and associated with some suicidal thoughts but no plan.  The patient states that she feels overwhelmed with a break-up and with recent health issues after an admission to the hospital for COPD.  However, she denies any acute medical issues and states that she feels physically well.  She denies any suicidal plan or attempts.  Past Medical History:  Diagnosis Date  . Asthma   . Bipolar 1 disorder, depressed, severe ([place]) 02/04/2017  . Cocaine abuse with cocaine-induced mood disorder ([place]) 07/27/2017  . COPD (chronic obstructive pulmonary disease) ([place])   . Depression   . Homicidal ideations   . Manic behavior ([place])   . MDD (major depressive disorder), recurrent severe, without psychosis ([place]) 08/08/2015  . Substance or medication-induced bipolar and related disorder with onset during intoxication ([place]) 09/08/2016  . Suicidal ideation     Patient Active Problem List   Diagnosis Date Noted  . Uterine leiomyoma   . COPD exacerbation ([place])   . Adjustment disorder with mixed anxiety and depressed mood 11/21/2019  . SIRS (systemic inflammatory response syndrome) ([place]) 10/04/2019  . Acute blood loss anemia 10/04/2019  . Symptomatic anemia 04/18/2019  . SOB (shortness of breath) 01/18/2019  . Acute asthma exacerbation 01/18/2019  . Asthma, chronic 12/21/2018  . Bipolar I disorder, most recent episode depressed ([place]) 12/21/2018  . Altered mental status   . Acute respiratory failure with hypoxia ([place]) 10/14/2018    . Bronchitis 10/05/2018  . Acute bronchitis with asthma with acute exacerbation 10/04/2018  . Tobacco use disorder 05/22/2017  . Suicidal ideation   . Cannabis use disorder, severe, dependence ([place]) 01/20/2017  . Cocaine use disorder, severe, dependence ([place]) 08/08/2015    Past Surgical History:  Procedure Laterality Date  . FINGER SURGERY      Prior to Admission medications   Medication Sig Start Date End Date Taking? Authorizing Provider  albuterol (VENTOLIN HFA) 108 (90 Base) MCG/ACT inhaler Inhale 2 puffs into the lungs every 6 (six) hours as needed for wheezing or shortness of breath. 06/18/20  Yes Aslam, Sadia, MD  Fluticasone-Salmeterol (ADVAIR) 100-50 MCG/DOSE AEPB Inhale 1 puff into the lungs 2 (two) times daily. 06/18/20  Yes Aslam, Loralyn [place], MD  oxyCODONE-acetaminophen (PERCOCET) 7.5-325 MG tablet Take 1 tablet by mouth every 8 (eight) hours as needed for up to 3 days for severe pain. 06/18/20 06/21/20 Yes Aslam, Loralyn [place], MD  pantoprazole (PROTONIX) 40 MG tablet Take 1 tablet (40 mg total) by mouth daily. 06/18/20  Yes Aslam, Loralyn [place], MD  ferrous sulfate 325 (65 FE) MG tablet Take 1 tablet (325 mg total) by mouth daily with breakfast. 06/18/20   Harvie Heck, MD  megestrol (MEGACE) 40 MG tablet Take 1 tablet (40 mg total) by mouth daily. 06/18/20   Harvie Heck, MD    Allergies Patient has no known allergies.  Family History  Problem Relation Age of Onset  . Diabetes Mother   . Hypertension Mother   . Drug abuse Father   . Schizophrenia Maternal Aunt     Social History Social History  Tobacco Use  . Smoking status: Current Every Day Smoker    Packs/day: 1.00    Types: Cigarettes  . Smokeless tobacco: Never Used  Vaping Use  . Vaping Use: Never used  Substance Use Topics  . Alcohol use: Yes    Alcohol/week: 1.0 standard drink    Types: 1 Cans of beer per week  . Drug use: Yes    Frequency: 5.0 times per week    Types: Cocaine, Marijuana    Comment: +Cocaine and THC     Review of Systems  Constitutional: No fever/chills. Eyes: No redness. ENT: No sore throat. Cardiovascular: Denies chest pain. Respiratory: Denies shortness of breath. Gastrointestinal: No vomiting or diarrhea.  Genitourinary: Negative for dysuria.  Musculoskeletal: Negative for back pain. Skin: Negative for rash. Neurological: Negative for headache.   ____________________________________________   PHYSICAL EXAM:  VITAL SIGNS: ED Triage Vitals [06/21/20 0642]  Enc Vitals Group     BP (!) 168/105     Pulse Rate (!) 107     Resp 20     Temp 97.7 F (36.5 C)     Temp Source Oral     SpO2 100 %     Weight 150 lb (68 kg)     Height 5\' 4"  (1.626 m)     Head Circumference      Peak Flow      Pain Score 0     Pain Loc      Pain Edu?      Excl. in [place]?     Constitutional: Alert and oriented. Well appearing and in no acute distress. Eyes: Conjunctivae are normal.  Head: Atraumatic. Nose: No congestion/rhinnorhea. Mouth/Throat: Mucous membranes are moist.   Neck: Normal range of motion.  Cardiovascular: Normal rate, regular rhythm. Good peripheral circulation. Respiratory: Normal respiratory effort.  No retractions.  Gastrointestinal: No distention.  Musculoskeletal: Extremities warm and well perfused.  Neurologic:  Normal speech and language. No gross focal neurologic deficits are appreciated.  Skin:  Skin is warm and dry. No rash noted. Psychiatric: Mood and affect are normal. Speech and behavior are normal.  ____________________________________________   LABS (all labs ordered are listed, but only abnormal results are displayed)  Labs Reviewed  CBC - Abnormal; Notable for the following components:      Result Value   Hemoglobin 9.3 (*)    HCT 30.7 (*)    MCV 74.0 (*)    MCH 22.4 (*)    RDW 23.4 (*)    nRBC 0.6 (*)    All other components within normal limits  COMPREHENSIVE METABOLIC PANEL - Abnormal; Notable for the following components:   Calcium 8.8  (*)    All other components within normal limits  URINALYSIS, COMPLETE (UACMP) WITH MICROSCOPIC - Abnormal; Notable for the following components:   Color, Urine YELLOW (*)    APPearance HAZY (*)    Hgb urine dipstick LARGE (*)    Protein, ur 100 (*)    RBC / HPF >50 (*)    All other components within normal limits  URINE DRUG SCREEN, QUALITATIVE ([facility] ONLY) - Abnormal; Notable for the following components:   Cannabinoid 50 Ng, Ur [place] POSITIVE (*)    All other components within normal limits  ETHANOL  POC URINE PREG, ED   ____________________________________________  EKG   ____________________________________________  RADIOLOGY    ____________________________________________   PROCEDURES  Procedure(s) performed: No  Procedures  Critical Care performed: No ____________________________________________   INITIAL IMPRESSION / ASSESSMENT AND PLAN /  ED COURSE  Pertinent labs & imaging results that were available during my care of the patient were reviewed by me and considered in my medical decision making (see chart for details).  44 year old female with PMH as noted above including depression, COPD and anemia presents with worsening depression over the last few weeks, with suicidal thoughts but no plan and no attempts.  She denies any acute medical issues.  She states that she was just restarted on trazodone and gabapentin yesterday, but states they have not taken effect.  I reviewed the past medical records in [place].  The patient was just admitted inpatient at [facility] for COPD exacerbation and anemia due to fibroids.  She states that the symptoms have resolved and she is feeling well.  On exam, the patient is calm and cooperative.  Her vital signs are normal except for hypertension and borderline tachycardia.  Her physical exam is otherwise unremarkable.  At this time, the patient has no active suicidal plan, is motivated to get help, and is able to contract for safety.   I have therefore not placed her under IVC.  I will order psychiatry evaluation.  ----------------------------------------- 3:55 PM on 06/21/2020 -----------------------------------------  The patient has been evaluated by Dr. Janese Banks from psychiatry and will be observed further today.  Disposition is pending.  _______________________________  The patient has been placed in psychiatric observation due to the need to provide a safe environment for the patient while obtaining psychiatric consultation and evaluation, as well as ongoing medical and medication management to treat the patient's condition.  The patient has not been placed under full IVC at this time.  ____________________________________________   FINAL CLINICAL IMPRESSION(S) / ED DIAGNOSES  Final diagnoses:  Suicidal ideation      NEW MEDICATIONS STARTED DURING THIS VISIT:  New Prescriptions   No medications on file     Note:  This document was prepared using Dragon voice recognition software and may include unintentional dictation errors.    Arta Silence, MD 06/21/20 1555

## 2020-06-21 NOTE — BH Assessment (Addendum)
Updated Disposition: This Probation officer spoke with Dr. Janese Banks (Psych MD) who recommended for patient to be started on medications and be observed overnight. Pt to be reassessed by the psych team in the A.M. Pt was updated on the plan and pt voiced an understanding.    6:11 PM Per the request of EDP Dr. Quentin Cornwall, writer spoke with Dr. Janese Banks about pt wanting to discharge. Dr. Janese Banks stated he is ok with pt. Discharging. Updated Dr. Quentin Cornwall.  6:22 PM Pt's nurse called to report that pt is now requesting to stay overnight. This Probation officer contacted Dr. Janese Banks to confirm. Dr. Janese Banks explained he prefers that the pt stay overnight for observation, to be reassessed in the A.M. However he expressed that he is ok with the pt. Leaving voluntarily if she decides to do so as the pt does not meet IVC criteria.

## 2020-06-21 NOTE — ED Notes (Signed)
Pt given dinner tray and sprite.  

## 2020-06-21 NOTE — ED Notes (Signed)
Pt asking to leave. Pt attempting to call someone to pick her up. Pt can be discharged per Dr Quentin Cornwall.

## 2020-06-21 NOTE — ED Triage Notes (Signed)
Pt here for suicidal thoughts, states feeling overwhelmed with break up and recent health illnesses. Has also been out of antidepressants.

## 2020-06-21 NOTE — ED Notes (Addendum)
Patient asking "why she is not being admitted to behavioral unit and wants to be transferred to [facility] or [place]." TTS [place] made aware. Patient was told the psychiatrist wants her to stay the night and start her meds and then be discharged tomorrow. Patient verbalized understanding and said ok.

## 2020-06-21 NOTE — ED Provider Notes (Signed)
The patient has been evaluated at bedside by Dr. Janese Banks, psychiatry.  Patient is clinically stable.  Not felt to be a danger to self or others.  No SI or Hi.  No indication for inpatient psychiatric admission at this time.  Appropriate for continued outpatient therapy.    Merlyn Lot, MD 06/21/20 1806

## 2020-06-21 NOTE — BH Assessment (Addendum)
Assessment Note  Deborah Melendez is an 44 y.o. female. Pt presents voluntarily to the ED for suicidal thoughts and relationship conflict with her boyfriend. The pt presented with a disheveled appearance and was visibly anxious. The pt reported that she had been off of her psych medications for over six months. The patient reported that she had engaged in marijuana use. The pt had a labile affect and became increasingly agitated as the interview progressed. The pt had a loud voice tone and was easily agitated. The pt. HI, and AV/H, however she did endore SI and admitted to having symptoms of severe anxiety and depression.   Diagnosis: Bipolar 1 disorder, depressed, severe  Past Medical History:  Past Medical History:  Diagnosis Date  . Asthma   . Bipolar 1 disorder, depressed, severe ([place]) 02/04/2017  . Cocaine abuse with cocaine-induced mood disorder ([place]) 07/27/2017  . COPD (chronic obstructive pulmonary disease) ([place])   . Depression   . Homicidal ideations   . Manic behavior ([place])   . MDD (major depressive disorder), recurrent severe, without psychosis ([place]) 08/08/2015  . Substance or medication-induced bipolar and related disorder with onset during intoxication ([place]) 09/08/2016  . Suicidal ideation     Past Surgical History:  Procedure Laterality Date  . FINGER SURGERY      Family History:  Family History  Problem Relation Age of Onset  . Diabetes Mother   . Hypertension Mother   . Drug abuse Father   . Schizophrenia Maternal Aunt     Social History:  reports that she has been smoking cigarettes. She has been smoking about 1.00 pack per day. She has never used smokeless tobacco. She reports current alcohol use of about 1.0 standard drink of alcohol per week. She reports current drug use. Frequency: 5.00 times per week. Drugs: Cocaine and Marijuana.  Additional Social History:  Alcohol / Drug Use Pain Medications: See PTA. Prescriptions: See PTA. History of alcohol /  drug use?: Yes Negative Consequences of Use: Personal relationships Substance #1 Name of Substance 1: Marijuana Substance #2 Name of Substance 2: Cocaine  CIWA: CIWA-Ar BP: (!) 168/105 Pulse Rate: (!) 107 COWS:    Allergies: No Known Allergies  Home Medications: (Not in a hospital admission)   OB/GYN Status:  No LMP recorded (lmp unknown).  General Assessment Data Assessment unable to be completed: Yes Location of Assessment: [facility] ED TTS Assessment: In system Is this a Tele or Face-to-Face Assessment?: Face-to-Face Is this an Initial Assessment or a Re-assessment for this encounter?: Initial Assessment Patient Accompanied by:: N/A Language Other than English: No Living Arrangements: Other (Comment) What gender do you identify as?: Female Date Telepsych consult ordered in [facility]: 06/21/20 Time Telepsych consult ordered in [facility]: 0832 Marital status: Single Pregnancy Status: No Living Arrangements: Spouse/significant other Can pt return to current living arrangement?: Yes Admission Status: Voluntary Is patient capable of signing voluntary admission?: Yes Referral Source: Self/Family/Friend Insurance type: None  Medical Screening Exam ([place]) Medical Exam completed: Yes  Crisis Care Plan Living Arrangements: Spouse/significant other Legal Guardian:  (Self)  Education Status Is patient currently in school?: No Is the patient employed, unemployed or receiving disability?: Unemployed  Risk to self with the past 6 months Suicidal Ideation: No Has patient been a risk to self within the past 6 months prior to admission? : No Suicidal Intent: No Has patient had any suicidal intent within the past 6 months prior to admission? : No Is patient at risk for suicide?: No Suicidal  Plan?: No Has patient had any suicidal plan within the past 6 months prior to admission? : No Access to Means: No What has been your use of drugs/alcohol within the last 12 months?:  Marijuana Previous Attempts/Gestures: No Other Self Harm Risks: Active Depression Triggers for Past Attempts: None known Intentional Self Injurious Behavior: None Family Suicide History: Unknown Recent stressful life event(s): Conflict (Comment) Persecutory voices/beliefs?: No Depression: Yes Depression Symptoms: Despondent, Feeling angry/irritable, Loss of interest in usual pleasures Substance abuse history and/or treatment for substance abuse?: Yes Suicide prevention information given to non-admitted patients: Not applicable  Risk to Others within the past 6 months Homicidal Ideation: No Does patient have any lifetime risk of violence toward others beyond the six months prior to admission? : No Thoughts of Harm to Others: No Current Homicidal Intent: No Current Homicidal Plan: No Access to Homicidal Means: No History of harm to others?: No Assessment of Violence: None Noted Violent Behavior Description: None noted Does patient have access to weapons?: No Criminal Charges Pending?: No Does patient have a court date: No Is patient on probation?: No  Psychosis Hallucinations: None noted Delusions: None noted  Mental Status Report Appearance/Hygiene: Disheveled Eye Contact: Fair Motor Activity: Freedom of movement, Agitation Speech: Aggressive, Argumentative, Loud, Abusive Level of Consciousness: Alert, Restless, Irritable Mood: Labile, Irritable, Anxious Affect: Anxious, Irritable, Labile, Depressed Anxiety Level: Severe Thought Processes: Coherent Judgement: Partial Orientation: Place, Person, Time, Situation Obsessive Compulsive Thoughts/Behaviors: Unable to Assess  Cognitive Functioning Concentration: Poor Memory: Recent Intact, Remote Intact Is patient IDD: No Insight: Fair Impulse Control: Poor Appetite: Fair Have you had any weight changes? : No Change Sleep: Unable to Assess Total Hours of Sleep:  (Unable to Assess) Vegetative Symptoms:  None  ADLScreening [facility] Assessment Services) Patient's cognitive ability adequate to safely complete daily activities?: Yes Patient able to express need for assistance with ADLs?: Yes Independently performs ADLs?: Yes (appropriate for developmental age)  Prior Inpatient Therapy Prior Inpatient Therapy: Yes Prior Therapy Dates: 12/21/2018 Prior Therapy Facilty/Provider(s): [facility] Reason for Treatment: Suicidal Ideation; Polysubstance Abuse     ADL Screening (condition at time of admission) Patient's cognitive ability adequate to safely complete daily activities?: Yes Is the patient deaf or have difficulty hearing?: No Does the patient have difficulty seeing, even when wearing glasses/contacts?: No Does the patient have difficulty concentrating, remembering, or making decisions?: No Patient able to express need for assistance with ADLs?: Yes Does the patient have difficulty dressing or bathing?: No Independently performs ADLs?: Yes (appropriate for developmental age) Does the patient have difficulty walking or climbing stairs?: No Weakness of Legs: None Weakness of Arms/Hands: None  Home Assistive Devices/Equipment Home Assistive Devices/Equipment: None  Therapy Consults (therapy consults require a physician order) PT Evaluation Needed: No OT Evalulation Needed: No SLP Evaluation Needed: No   Values / Beliefs Cultural Requests During Hospitalization: None Spiritual Requests During Hospitalization: None Consults Spiritual Care Consult Needed: No Transition of Care Team Consult Needed: No            Disposition: Pt is recommended for overnight observation per psych MD.  Disposition Initial Assessment Completed for this Encounter: Yes  On Site Evaluation by:   Reviewed with Physician:    Kathi Ludwig 06/21/2020 12:50 PM

## 2020-06-21 NOTE — ED Notes (Signed)
Patient verbally aggressive during med pass. Patient demanding pain medication, to be transferred to [facility], and to look up her records from [place] and see that she is on pain medication. Psychiatrist made aware of patients behavior and requesting pain medications.

## 2020-07-04 ENCOUNTER — Telehealth: Payer: Self-pay | Admitting: Family Medicine

## 2020-07-04 ENCOUNTER — Telehealth: Payer: Self-pay

## 2020-07-04 MED FILL — $VENTOLIN HFA 18G INHALER: 108 (90 BAS | 25 days supply | Qty: 18 | Fill #0

## 2020-07-04 MED FILL — $ADVAIR 100/50 MCG INHALER: 100-50 | 30 days supply | Qty: 60 | Fill #0

## 2020-07-04 NOTE — Telephone Encounter (Signed)
Patient is requesting refill on inhalers on file sent to [facility].   Patient has been set up with first appointment for [place].

## 2020-07-04 NOTE — Telephone Encounter (Signed)
Pt mother called and stated a call from the office was missed and she was returning the call/ please advise

## 2020-07-04 NOTE — Telephone Encounter (Signed)
Please f/u   Copied from [place] [phone]. Topic: General - Other >> Jul 02, 2020 11:23 AM Hinda Lenis D wrote: Reason for CRM: PT need a call back, questions about her oxygen / please advise / she can only receive calls

## 2020-07-04 NOTE — Telephone Encounter (Signed)
Patient has refills available in our pharmacy. I have placed these for him.

## 2020-07-10 ENCOUNTER — Ambulatory Visit: Payer: Self-pay | Admitting: Obstetrics and Gynecology

## 2020-07-11 ENCOUNTER — Other Ambulatory Visit (HOSPITAL_COMMUNITY)
Admission: RE | Admit: 2020-07-11 | Discharge: 2020-07-11 | Disposition: A | Discharge status: Home / Self Care | Payer: Self-pay | Source: Ambulatory Visit | Attending: Obstetrics and Gynecology | Admitting: Obstetrics and Gynecology

## 2020-07-11 ENCOUNTER — Other Ambulatory Visit: Payer: Self-pay

## 2020-07-11 ENCOUNTER — Ambulatory Visit (INDEPENDENT_AMBULATORY_CARE_PROVIDER_SITE_OTHER): Payer: Self-pay | Admitting: Obstetrics and Gynecology

## 2020-07-11 ENCOUNTER — Encounter: Payer: Self-pay | Admitting: Obstetrics and Gynecology

## 2020-07-11 VITALS — BP 125/88 | HR 105 | Wt 161.1 lb

## 2020-07-11 DIAGNOSIS — N921 Excessive and frequent menstruation with irregular cycle: Secondary | ICD-10-CM

## 2020-07-11 DIAGNOSIS — D252 Subserosal leiomyoma of uterus: Secondary | ICD-10-CM

## 2020-07-11 DIAGNOSIS — D529 Folate deficiency anemia, unspecified: Secondary | ICD-10-CM

## 2020-07-11 DIAGNOSIS — Z124 Encounter for screening for malignant neoplasm of cervix: Secondary | ICD-10-CM

## 2020-07-11 DIAGNOSIS — Z3202 Encounter for pregnancy test, result negative: Secondary | ICD-10-CM

## 2020-07-11 DIAGNOSIS — N920 Excessive and frequent menstruation with regular cycle: Secondary | ICD-10-CM

## 2020-07-11 DIAGNOSIS — N939 Abnormal uterine and vaginal bleeding, unspecified: Secondary | ICD-10-CM

## 2020-07-11 LAB — POCT PREGNANCY, URINE: Preg Test, Ur: NEGATIVE

## 2020-07-11 MED ORDER — MEGESTROL ACETATE 40 MG PO TABS: 40.0000 mg | ORAL_TABLET | Freq: Every day | ORAL | 1 refills | Status: DC

## 2020-07-11 NOTE — Progress Notes (Signed)
Subjective:    Patient ID: Deborah Melendez is a 44 y.o. female presenting with Abnormal Uterine Bleeding  on 07/11/2020  HPI: 44 yo G6P4 seen for discussion of symptomatic menorrhagia due to fibroid uterus.  Pt has had at least two hospital stays due to anemia.  Hysterectomy had been discussed as of 09/2019, but pt was lost to follow up.  Pt readmitted June 2020 due to shortness of breath and symptomatic anemia.  Ultrasound showed enlarged uterus.  She received 3 units of packed RBC which increased her h/h from 6.8/23.9 to 9.3/30.7.  Pt was started on megace, but she discontinued it after being discharged from the hospital.  Pt is very interested in hysterectomy at this time.  Due to size and bulk of uterus, medical management would be unlikely to be of long term help.  Pt desires definitive therapy.  Discussed with patient she will need pulmonary and possibly anesthesia clearance due to her COPD.  Unfortunately pt also admits to chronic THC use and history of cocaine use.  We have already discussed no surgery will be performed if her drug test is positive for cocaine.    Review of Systems  Constitutional: Positive for fatigue.  HENT: Negative.   Eyes: Negative.   Respiratory: Positive for shortness of breath and wheezing.   Cardiovascular: Negative.   Endocrine: Negative.   Genitourinary: Positive for pelvic pain and vaginal bleeding.  Musculoskeletal: Negative.   Skin: Negative.   Allergic/Immunologic: Negative.   Neurological: Negative.   Psychiatric/Behavioral: Positive for agitation and decreased concentration.      Objective:    BP 125/88   Pulse (!) 105   Wt 161 lb 1.6 oz (73.1 kg)   LMP 07/10/2020   BMI 27.65 kg/m  Physical Exam Exam conducted with a chaperone present.  Constitutional:      Appearance: Normal appearance. She is normal weight.  HENT:     Head: Normocephalic and atraumatic.     Nose: Nose normal.  Eyes:     Extraocular Movements: Extraocular  movements intact.  Cardiovascular:     Rate and Rhythm: Normal rate and regular rhythm.     Heart sounds: Normal heart sounds.  Pulmonary:     Effort: Pulmonary effort is normal. No respiratory distress.     Breath sounds: Wheezing and rales present.  Abdominal:     Palpations: There is mass.     Tenderness: There is no abdominal tenderness.     Comments: Palpable mass noted just below umbilicus  Musculoskeletal:        General: Normal range of motion.     Cervical back: Normal range of motion.  Skin:    General: Skin is warm and dry.  Neurological:     General: No focal deficit present.     Mental Status: She is alert.   gyn exam:  Cervix WNL, pap taken Uterus grossly enlarged 16-18 week size, abnormal contour.   Adnexa grossly normal   See note for endometrial biopsy        Assessment & Plan:   Symptomatic anemia secondary to menorrhagia and fibroid uterus. Will f/u with patient in 2-4 weeks after pt has seen her PCP. Have discussed with pt we need to know any recommendations regarding her COPD before her surgery can be scheduled.  Pt notes agreement.   Pt may also need anesthesia consult before surgery date is locked in. Recheck h/h today rx for megace given Return for gyn f/u.  Griffin Basil  07/11/2020 5:30 PM

## 2020-07-11 NOTE — Progress Notes (Signed)
ENDOMETRIAL BIOPSY      Jourden Denette Bally is a 44 y.o. G6P4 here for endometrial biopsy.  The indications for endometrial biopsy were reviewed.  Risks of the biopsy including cramping, bleeding, infection, uterine perforation, inadequate specimen and need for additional procedures were discussed. The patient states she understands and agrees to undergo procedure today. Consent was signed. Time out was performed.   Indications: menorrhagia, fibroids Urine HCG: negative  A bivalve speculum was placed into the vagina and the cervix was easily visualized and was prepped with Betadine x2. A single-toothed tenaculum was placed on the anterior lip of the cervix to stabilize it. The 3 mm pipelle was introduced into the endometrial cavity without difficulty to a depth of 10 cm, and a moderate amount of tissue was obtained and sent to pathology. This was repeated for a total of 2 passes. The instruments were removed from the patient's vagina. Minimal bleeding from the cervix at the tenaculum was noted.   The patient tolerated the procedure well. Routine post-procedure instructions were given to the patient.    Will base further management on results of biopsy.  Lynnda Shields, MD  Cherlynn June

## 2020-07-11 NOTE — Patient Instructions (Signed)
Hysterectomy Information  A hysterectomy is a surgery to remove your uterus. After surgery, you will no longer have periods. Also, you will no longer be able to get pregnant. Reasons for this surgery You may have this surgery if:  You have bleeding in your vagina: ? That is not normal. ? That does not stop, or that keeps coming back.  You have long-term (chronic) pain in your lower belly (pelvic area).  The lining of your uterus grows outside of the uterus (endometriosis).  The lining of your uterus grows in the muscle of the uterus (adenomyosis).  Your uterus falls down into your vagina (prolapse).  You have a growth in your uterus that causes problems (uterine fibroids).  You have cells that could turn into cancer (precancerous cells).  You have cancer of the uterus or cervix. Types of hysterectomies There are 3 types of hysterectomies. Depending on the type, the surgery will:  Remove the top part of the uterus (supracervical).  Remove the uterus and the cervix (total).  Remove the uterus, cervix, and tissue that holds the uterus in place (radical). Ways a hysterectomy can be done This surgery may be done in one of these ways:  A cut (incision) is made in the belly (abdomen). The uterus is taken out through the cut.  A cut is made in the vagina. The uterus is taken out through the cut.  Three or four cuts are made in the belly. A device with a camera is put through one of the cuts. The uterus is cut into pieces and taken out through the cuts or the vagina.  Three or four cuts are made in the belly. A device with a camera is put through one of the cuts. The uterus is taken out through the vagina.  Three or four cuts are made in the belly. A computer helps control the surgical tools. The uterus is cut into small pieces. The pieces are taken out through the cuts or through the vagina. Talk with your doctor about which way is best for you. Risks of hysterectomy Generally,  this surgery is safe. However, problems can happen, including:  Bleeding.  Needing donated blood (transfusion).  Blood clots.  Infection.  Damage to other structures or organs.  Allergic reactions.  Needing to switch to a different type of surgery. What to expect after surgery  You will be given pain medicine.  You will need to stay in the hospital for 1-2 days.  Follow your doctor's instructions about: ? Exercising. ? Driving. ? What activities are safe for you.  You will need to have someone with you at home for 3-5 days.  You will need to see your doctor after 2-4 weeks.  You may get hot flashes, have night sweats, and have trouble sleeping.  You may need to have Pap tests if your surgery was related to cancer. Talk with your doctor about how often you need Pap tests. Questions to ask your doctor  Do I need this surgery? Do I have other treatment options?  What are my options for this surgery?  What needs to be removed?  What are the risks?  What are the benefits?  How long will I need to stay in the hospital?  How long will I need to recover?  What symptoms can I expect after the procedure? Summary  A hysterectomy is a surgery to remove your uterus. After surgery, you will no longer have periods. Also, you will no longer be able to   get pregnant.  Talk with your doctor about which type of hysterectomy is best for you. This information is not intended to replace advice given to you by your health care provider. Make sure you discuss any questions you have with your health care provider. Document Revised: 02/16/2019 Document Reviewed: 03/16/2017 Elsevier Patient Education  [employer]. Uterine Fibroids  Uterine fibroids (leiomyomas) are noncancerous (benign) tumors that can develop in the uterus. Fibroids may also develop in the fallopian tubes, cervix, or tissues (ligaments) near the uterus. You may have one or many fibroids. Fibroids vary in  size, weight, and where they grow in the uterus. Some can become quite large. Most fibroids do not require medical treatment. What are the causes? The cause of this condition is not known. What increases the risk? You are more likely to develop this condition if you:  Are in your 30s or 40s and have not gone through menopause.  Have a family history of this condition.  Are of African-American descent.  Had your first period at an early age (early menarche).  Have not had any children (nulliparity).  Are overweight or obese. What are the signs or symptoms? Many women do not have any symptoms. Symptoms of this condition may include:  Heavy menstrual bleeding.  Bleeding or spotting between periods.  Pain and pressure in the pelvic area, between the hips.  Bladder problems, such as needing to urinate urgently or more often than usual.  Inability to have children (infertility).  Failure to carry pregnancy to term (miscarriage). How is this diagnosed? This condition may be diagnosed based on:  Your symptoms and medical history.  A physical exam.  A pelvic exam that includes feeling for any tumors.  Imaging tests, such as ultrasound or MRI. How is this treated? Treatment for this condition may include:  Seeing your health care provider for follow-up visits to monitor your fibroids for any changes.  Taking NSAIDs such as ibuprofen, naproxen, or aspirin to reduce pain.  Hormone medicines. These may be taken as a pill, given in an injection, or delivered by a T-shaped device that is inserted into the uterus (intrauterine device, IUD).  Surgery to remove one of the following: ? The fibroids (myomectomy). Your health care provider may recommend this if fibroids affect your fertility and you want to become pregnant. ? The uterus (hysterectomy). ? Blood supply to the fibroids (uterine artery embolization). Follow these instructions at home:  Take over-the-counter and  prescription medicines only as told by your health care provider.  Ask your health care provider if you should take iron pills or eat more iron-rich foods, such as dark green, leafy vegetables. Heavy menstrual bleeding can cause low iron levels.  If directed, apply heat to your back or abdomen to reduce pain. Use the heat source that your health care provider recommends, such as a moist heat pack or a heating pad. ? Place a towel between your skin and the heat source. ? Leave the heat on for 20-30 minutes. ? Remove the heat if your skin turns bright red. This is especially important if you are unable to feel pain, heat, or cold. You may have a greater risk of getting burned.  Pay close attention to your menstrual cycle. Tell your health care provider about any changes, such as: ? Increased blood flow that requires you to use more pads or tampons than usual. ? A change in the number of days that your period lasts. ? A change in symptoms that  are associated with your period, such as back pain or cramps in your abdomen.  Keep all follow-up visits as told by your health care provider. This is important, especially if your fibroids need to be monitored for any changes. Contact a health care provider if you:  Have pelvic pain, back pain, or cramps in your abdomen that do not get better with medicine or heat.  Develop new bleeding between periods.  Have increased bleeding during or between periods.  Feel unusually tired or weak.  Feel light-headed. Get help right away if you:  Faint.  Have pelvic pain that suddenly gets worse.  Have severe vaginal bleeding that soaks a tampon or pad in 30 minutes or less. Summary  Uterine fibroids are noncancerous (benign) tumors that can develop in the uterus.  The exact cause of this condition is not known.  Most fibroids do not require medical treatment unless they affect your ability to have children (fertility).  Contact a health care provider  if you have pelvic pain, back pain, or cramps in your abdomen that do not get better with medicines.  Make sure you know what symptoms should cause you to get help right away. This information is not intended to replace advice given to you by your health care provider. Make sure you discuss any questions you have with your health care provider. Document Revised: 11/26/2017 Document Reviewed: 11/09/2017 Elsevier Patient Education  2020 [employer].

## 2020-07-12 LAB — HEMOGLOBIN AND HEMATOCRIT, BLOOD
Hematocrit: 29.7 % — ABNORMAL LOW (ref 34.0–46.6)
Hemoglobin: 9.3 g/dL — ABNORMAL LOW (ref 11.1–15.9)

## 2020-07-15 LAB — SURGICAL PATHOLOGY

## 2020-07-16 ENCOUNTER — Telehealth: Payer: Self-pay

## 2020-07-16 NOTE — Telephone Encounter (Signed)
error 

## 2020-07-17 LAB — CYTOLOGY - PAP
Comment: NEGATIVE
Diagnosis: NEGATIVE
High risk HPV: NEGATIVE

## 2020-07-19 ENCOUNTER — Telehealth: Payer: Self-pay | Admitting: General Practice

## 2020-07-19 ENCOUNTER — Telehealth: Payer: Self-pay | Admitting: Lactation Services

## 2020-07-19 NOTE — Telephone Encounter (Signed)
Called patient to discuss coming in to sign hysterectomy statement form, no answer- left message stating we are calling her regarding a need to sign a paper prior to an appt she has. Asked she call us back so more information can be given. Per chart review, patient has office visit scheduled 8/3. Note placed in the appt.

## 2020-07-19 NOTE — Telephone Encounter (Signed)
Called Short Stay to schedule [facility]. LM for Short Stay to call back to schedule. Will need to follow up on Monday with Short Stay and patient.

## 2020-07-19 NOTE — Telephone Encounter (Signed)
-----   Message from Francia Greaves sent at 07/19/2020  8:57 AM EDT ----- Regarding: [place] - SURGERY 09/14

## 2020-07-19 NOTE — Telephone Encounter (Signed)
-----   Message from Griffin Basil, MD sent at 07/14/2020 11:25 AM EDT ----- H/h is stable, schedule another outpatient ferreheme in preparation for surgery

## 2020-07-23 NOTE — Telephone Encounter (Signed)
Short stay called and Feraheme infusion scheduled for 07/31/20 at 1000. Called pt; VM left with appt time and asked pt to call our office for further details. Callback number given.

## 2020-07-24 ENCOUNTER — Ambulatory Visit: Payer: Self-pay | Admitting: Family Medicine

## 2020-07-25 ENCOUNTER — Inpatient Hospital Stay (HOSPITAL_COMMUNITY)
Admission: RE | Admit: 2020-07-25 | Discharge: 2020-07-25 | Disposition: A | Discharge status: Home / Self Care | Payer: Self-pay | Source: Ambulatory Visit | Attending: Obstetrics and Gynecology | Admitting: Obstetrics and Gynecology

## 2020-07-25 NOTE — Progress Notes (Signed)
Patient had a 07/25/20 0900 appt for feraheme scheduled today and was a no show.

## 2020-07-25 NOTE — Telephone Encounter (Signed)
Called patient to make sure she got the information on her upcoming Feraheme infusion. Patient did not answer. LM and asked patient to call the office to confirm she is aware of upcoming appointment.

## 2020-07-29 NOTE — Telephone Encounter (Signed)
Called pt and informed her of appt for Fereheme infusion on 8/4 @ 1000 @ Short Stay. She should arrive @ 0945.  Pt voiced understanding.

## 2020-07-30 ENCOUNTER — Other Ambulatory Visit (INDEPENDENT_AMBULATORY_CARE_PROVIDER_SITE_OTHER): Payer: Self-pay | Admitting: Obstetrics and Gynecology

## 2020-07-30 ENCOUNTER — Ambulatory Visit: Payer: Self-pay | Admitting: Obstetrics and Gynecology

## 2020-07-30 DIAGNOSIS — D62 Acute posthemorrhagic anemia: Secondary | ICD-10-CM

## 2020-07-30 MED ORDER — FERAHEME 510 MG/17ML IV SOLN: 510.0000 mg | Freq: Once | INTRAVENOUS | 0 refills | Status: DC

## 2020-07-30 NOTE — Progress Notes (Signed)
Orders for feraheme entered

## 2020-07-31 ENCOUNTER — Telehealth: Payer: Self-pay

## 2020-07-31 ENCOUNTER — Encounter (HOSPITAL_COMMUNITY): Payer: Self-pay

## 2020-07-31 NOTE — Telephone Encounter (Signed)
Pt no showed appt on 07/30/20. Pt has not signed hysterectomy statement. Called pt to follow up. Voicemail left stating I am calling to follow up regarding missed appt and stated pt should come to the office during business hours to sign paperwork. Office hours given.

## 2020-08-07 ENCOUNTER — Telehealth: Payer: Self-pay | Admitting: Lactation Services

## 2020-08-07 NOTE — Telephone Encounter (Signed)
Called patient about signing Hysterectomy Statement. LM for patient that it is imperative she call the office ASAP. Informed her she needs to sign papers or surgery will need to be cancelled. Patient not active with My Chart. Patient has no upcoming appointments noted.

## 2020-08-07 NOTE — Telephone Encounter (Addendum)
Called pt contact listed; pt's mother answered the phone. Explained I am calling to reach [facility]. Pt's phone number verified with mother, requested mother tell pt we are trying to reach her if she talks with pt in near future.  Called pt; VM left requesting pt call the office or come by to sign time sensitive paperwork.   Letter sent to patient.

## 2020-08-07 NOTE — Telephone Encounter (Signed)
-----   Message from Francia Greaves sent at 08/07/2020  9:06 AM EDT ----- Regarding: NEEDS HYSTERECTOMY STATEMENT - SURGERY 09/14

## 2020-08-22 ENCOUNTER — Telehealth: Payer: Self-pay

## 2020-08-22 NOTE — Telephone Encounter (Signed)
Called patient, no answer, left voicemail informing her that her surgery for 09/14 would have to be rescheduled. Due to COVID surge, any surgery cases requiring an overnight stay in the main OR needed to be rescheduled. Will reschedule patient once November's schedule becomes available. Advised patient to call the office if she had any questions or concerns.

## 2020-09-10 ENCOUNTER — Inpatient Hospital Stay: Admit: 2020-09-10 | Payer: MEDICAID | Admitting: Obstetrics and Gynecology

## 2020-09-10 SURGERY — HYSTERECTOMY, TOTAL, ABDOMINAL, WITH SALPINGECTOMY
Anesthesia: Choice | Laterality: Bilateral

## 2020-09-13 ENCOUNTER — Telehealth: Payer: Self-pay | Admitting: Lactation Services

## 2020-09-13 NOTE — Telephone Encounter (Signed)
-----   Message from Francia Greaves sent at 09/13/2020 10:37 AM EDT ----- Regarding: NEED HYSTERECTOMY STATEMENT - SURGERY 11/09

## 2020-09-13 NOTE — Telephone Encounter (Signed)
Called patient to inform her she needs to come to the office to sign her Hysterectomy Statement. LM that patient needs to come to office to sign consent for surgery. Call back number left for patient.

## 2020-10-16 ENCOUNTER — Telehealth: Payer: Self-pay | Admitting: *Deleted

## 2020-10-16 NOTE — Telephone Encounter (Signed)
Notified by Dr. Elgie Congo patient must be rescheduled for preop and cleared by her PCP for surgery or surgery will be cancelled. Also notified patient must sign Hysterectomy statement by 10/22/20 or surgery will be cancelled.  I sent message to registrars to reschedule preop that she missed with Dr. Elgie Congo and notify her of appt. I called Jamilyn to notify her she must be seen by Dr. Elgie Congo for preop before surgery and she must sign hysterectomy statement by 10/22/20 and if both things not done then surgery will need to be cancelled and rescheduled.  She said " I can't make that appointment " and as I explained registrar will contact her with appointment- phone call was disconnected ( not by nurse). I called her back and informed her registrar will contact her with appointment and that she needs to get clearance from her PCP. She voices understanding. Alaena Strader,RN

## 2020-10-28 ENCOUNTER — Ambulatory Visit: Payer: Self-pay | Admitting: Obstetrics and Gynecology

## 2020-10-29 ENCOUNTER — Telehealth: Payer: Self-pay

## 2020-10-29 NOTE — Telephone Encounter (Signed)
-----   Message from Griffin Basil, MD sent at 10/28/2020  9:18 PM EDT ----- Regarding: RE: Surgery 11/09 So........ She didn't show up today to sign her hysterectomy form or to get her preop.  Do I try to see if I can get her rescheduled to Wednesday, my last day at [facility] this week, or do we just cancel her and move Dr. Domenic Schwab case up?  Elgie Congo ----- Message ----- From: Francia Greaves Sent: 08/30/2020   1:15 PM EDT To: Mora Bellman, MD, Rada Hay, # Subject: Surgery 11/09                                  Rescheduled for 11/05/2020 @0730  w/ Elgie Congo & Constant CPT [phone] Dx D259 ----- Message ----- From: Francia Greaves Sent: 08/22/2020   2:17 PM EDT To: Olegario Shearer Battle Subject: Reschedule Surgery in November                 Cancelled due to COVID bed restrictions in Main OR TAH/BS w/ Elgie Congo Case [phone]

## 2020-10-29 NOTE — Telephone Encounter (Signed)
Called patient, no answer, left voicemail that her surgery on 11/9 has been cancelled, no showed office appt, advised her to call the office to reschedule her appointment to be reconsidered for surgery.

## 2020-10-29 NOTE — Telephone Encounter (Signed)
Called to let Deborah Melendez know her surgery has been cancelled because she missed her office appointment w/ Dr. Elgie Congo. She responded okay, thank you. I advised her to call the office to reschedule her appointment is she would like to be reconsidered for surgery at a later time. Patient expressed understanding.

## 2020-11-05 ENCOUNTER — Encounter (HOSPITAL_COMMUNITY): Admission: RE | Payer: Self-pay | Source: Home / Self Care

## 2020-11-05 ENCOUNTER — Inpatient Hospital Stay (HOSPITAL_COMMUNITY): Admission: RE | Admit: 2020-11-05 | Payer: Self-pay | Source: Home / Self Care | Admitting: Obstetrics and Gynecology

## 2020-11-05 SURGERY — HYSTERECTOMY, TOTAL, ABDOMINAL, WITH SALPINGECTOMY
Anesthesia: Choice | Laterality: Bilateral

## 2020-12-25 ENCOUNTER — Telehealth: Payer: Self-pay | Admitting: Family Medicine

## 2020-12-25 NOTE — Telephone Encounter (Signed)
Please see patients request for a new nebulizer and supplies.

## 2020-12-25 NOTE — Telephone Encounter (Signed)
Medication Refill - Medication:Solution for nebulizer ( Patient is requesting PCP to order a new nebulizer machine and solution and wants to know if can be ordered through her new pharmacy due to her moving. Patient requesting callback in new machine can be ordered through her pharmacy)  Has the patient contacted their pharmacy? yes (Agent: If no, request that the patient contact the pharmacy for the refill.) (Agent: If yes, when and what did the pharmacy advise?)Contact PCP  Preferred Pharmacy (with phone number or street name):  [facility] [address] [place] - [address] [address] Phone:  [phone]  Fax:  [phone]       Agent: Please be advised that RX refills may take up to 3 business days. We ask that you follow-up with your pharmacy.

## 2020-12-31 NOTE — Telephone Encounter (Signed)
I have never seen this patient.  Last visit at [facility] was in 07/2019 and she needs an office visit.

## 2020-12-31 NOTE — Telephone Encounter (Signed)
Call placed to patient and vm was left informing patient to return phone call.  Pt needs to schedule an appointment.

## 2020-12-31 NOTE — Telephone Encounter (Signed)
Patient called again to check on her request for a new nebulizer machine with supplies. She can be reached at [phone]. Please advise.

## 2021-02-24 ENCOUNTER — Other Ambulatory Visit: Payer: Self-pay

## 2021-02-24 ENCOUNTER — Ambulatory Visit: Payer: Self-pay | Attending: Family Medicine | Admitting: Family Medicine

## 2021-02-27 ENCOUNTER — Other Ambulatory Visit: Payer: Self-pay | Admitting: Family Medicine

## 2021-02-27 NOTE — Telephone Encounter (Signed)
Requested medication (s) are due for refill today - no  Requested medication (s) are on the active medication list -ys  Future visit scheduled -no  Last refill: 06/18/20  Notes to clinic: Request RF of medication prescribed by outside provider  Requested Prescriptions  Pending Prescriptions Disp Refills   albuterol (VENTOLIN HFA) 108 (90 Base) MCG/ACT inhaler 8 g 0    Sig: Inhale 2 puffs into the lungs every 6 (six) hours as needed for wheezing or shortness of breath.      Pulmonology:  Beta Agonists Failed - 02/27/2021  2:19 PM      Failed - One inhaler should last at least one month. If the patient is requesting refills earlier, contact the patient to check for uncontrolled symptoms.      Passed - Valid encounter within last 12 months    Recent Outpatient Visits           1 year ago Mild intermittent chronic asthma with acute exacerbation   [place], [place] W, NP   2 years ago Hospital discharge follow-up   [place], [place]                    Requested Prescriptions  Pending Prescriptions Disp Refills   albuterol (VENTOLIN HFA) 108 (90 Base) MCG/ACT inhaler 8 g 0    Sig: Inhale 2 puffs into the lungs every 6 (six) hours as needed for wheezing or shortness of breath.      Pulmonology:  Beta Agonists Failed - 02/27/2021  2:19 PM      Failed - One inhaler should last at least one month. If the patient is requesting refills earlier, contact the patient to check for uncontrolled symptoms.      Passed - Valid encounter within last 12 months    Recent Outpatient Visits           1 year ago Mild intermittent chronic asthma with acute exacerbation   [place] Gildardo Pounds, NP   2 years ago Hospital discharge follow-up   [place], [place], [place]

## 2021-02-27 NOTE — Telephone Encounter (Signed)
Medication: albuterol (VENTOLIN HFA) 108 (90 Base) MCG/ACT inhaler [[id number]]   Has the patient contacted their pharmacy? YES  (Agent: If no, request that the patient contact the pharmacy for the refill.) (Agent: If yes, when and what did the pharmacy advise?)  Preferred Pharmacy (with phone number or street name): [place] [address] [phone]  Agent: Please be advised that RX refills may take up to 3 business days. We ask that you follow-up with your pharmacy.

## 2021-03-04 ENCOUNTER — Telehealth: Payer: Self-pay | Admitting: Family Medicine

## 2021-03-04 NOTE — Telephone Encounter (Signed)
Copied from [place] [id number]. Topic: Referral - Status >> Mar 04, 2021 10:45 AM Yvette Rack wrote: Reason for CRM: Pt requests that a referral to [facility] be faxed to fax# [phone] or [phone]

## 2021-03-05 NOTE — Telephone Encounter (Signed)
Pt was called and informed that she has to be seen in order to get referral, pt states that she is not coming to the office due to [place] not accepting her insurance and she will have to pay out of pocket. Pt was informed that she would need to find a doctor that accepts her insurance.

## 2021-05-27 ENCOUNTER — Ambulatory Visit: Payer: Self-pay | Admitting: *Deleted

## 2021-05-27 NOTE — Telephone Encounter (Signed)
Pt reports SOB. CAlling to request rx of inhalers. States onset SOB 4 days ago. SOB at rest, has productive cough, clear phlegm. States has been out of inhalers and nebs x 2 days.Appears last refill 6/21.  Pt reluctant for further triage.Audible wheezing during call. Advised ED, pt states will follow disposition.   Reason for Disposition . [1] MODERATE difficulty breathing (e.g., speaks in phrases, SOB even at rest, pulse 100-120) AND [2] NEW-onset or WORSE than normal  Answer Assessment - Initial Assessment Questions 1. RESPIRATORY STATUS: "Describe your breathing?" (e.g., wheezing, shortness of breath, unable to speak, severe coughing)      SOB x 4 days 2. ONSET: "When did this breathing problem begin?"      4 days ago 3. PATTERN "Does the difficult breathing come and go, or has it been constant since it started?"      Constant 4. SEVERITY: "How bad is your breathing?" (e.g., mild, moderate, severe)    - MILD: No SOB at rest, mild SOB with walking, speaks normally in sentences, can lie down, no retractions, pulse < 100.    - MODERATE: SOB at rest, SOB with minimal exertion and prefers to sit, cannot lie down flat, speaks in phrases, mild retractions, audible wheezing, pulse 100-120.    - SEVERE: Very SOB at rest, speaks in single words, struggling to breathe, sitting hunched forward, retractions, pulse > 120      Constant Moderate 5. RECURRENT SYMPTOM: "Have you had difficulty breathing before?" If Yes, ask: "When was the last time?" and "What happened that time?"      Asthyma 6. CARDIAC HISTORY: "Do you have any history of heart disease?" (e.g., heart attack, angina, bypass surgery, angioplasty)      7. LUNG HISTORY: "Do you have any history of lung disease?"  (e.g., pulmonary embolus, asthma, emphysema)    Asthma  COPD, resp. failure 8. CAUSE: "What do you think is causing the breathing problem?"     My Asthma 9. OTHER SYMPTOMS: "Do you have any other symptoms? (e.g., dizziness, runny  nose, cough, chest pain, fever)    "Chest congestion, cough, productive for clear phlegm.  Protocols used: BREATHING DIFFICULTY-A-AH

## 2024-10-23 ENCOUNTER — Other Ambulatory Visit: Payer: Self-pay

## 2024-10-23 ENCOUNTER — Emergency Department (HOSPITAL_COMMUNITY)
Admission: EM | Admit: 2024-10-23 | Discharge: 2024-10-23 | Disposition: A | Discharge status: Home / Self Care | Payer: MEDICAID

## 2024-10-23 ENCOUNTER — Encounter (HOSPITAL_COMMUNITY): Payer: Self-pay | Admitting: *Deleted

## 2024-10-23 ENCOUNTER — Emergency Department (HOSPITAL_COMMUNITY): Payer: MEDICAID

## 2024-10-23 DIAGNOSIS — J441 Chronic obstructive pulmonary disease with (acute) exacerbation: Secondary | ICD-10-CM | POA: Insufficient documentation

## 2024-10-23 DIAGNOSIS — Z7951 Long term (current) use of inhaled steroids: Secondary | ICD-10-CM | POA: Insufficient documentation

## 2024-10-23 DIAGNOSIS — R0600 Dyspnea, unspecified: Secondary | ICD-10-CM

## 2024-10-23 LAB — BASIC METABOLIC PANEL WITH GFR
Anion gap: 14 (ref 5–15)
BUN: 12 mg/dL (ref 6–20)
CO2: 18 mmol/L — ABNORMAL LOW (ref 22–32)
Calcium: 9 mg/dL (ref 8.9–10.3)
Chloride: 108 mmol/L (ref 98–111)
Creatinine, Ser: 0.98 mg/dL (ref 0.44–1.00)
GFR, Estimated: >60 mL/min (ref >60)
Glucose, Bld: 109 mg/dL — ABNORMAL HIGH (ref 70–99)
Potassium: 4.3 mmol/L (ref 3.5–5.1)
Sodium: 140 mmol/L (ref 135–145)

## 2024-10-23 LAB — CBC WITH DIFFERENTIAL/PLATELET
Abs Immature Granulocytes: 0.03 K/uL (ref 0.00–0.07)
Basophils Absolute: 0 K/uL (ref 0.0–0.1)
Basophils Relative: 0 %
Eosinophils Absolute: 0 K/uL (ref 0.0–0.5)
Eosinophils Relative: 1 %
HCT: 45.7 % (ref 36.0–46.0)
Hemoglobin: 15.2 g/dL — ABNORMAL HIGH (ref 12.0–15.0)
Immature Granulocytes: 0 %
Lymphocytes Relative: 29 %
Lymphs Abs: 2 K/uL (ref 0.7–4.0)
MCH: 31.9 pg (ref 26.0–34.0)
MCHC: 33.3 g/dL (ref 30.0–36.0)
MCV: 96 fL (ref 80.0–100.0)
Monocytes Absolute: 0.6 K/uL (ref 0.1–1.0)
Monocytes Relative: 8 %
Neutro Abs: 4.2 K/uL (ref 1.7–7.7)
Neutrophils Relative %: 62 %
Platelets: 241 K/uL (ref 150–400)
RBC: 4.76 MIL/uL (ref 3.87–5.11)
RDW: 14.5 % (ref 11.5–15.5)
WBC: 6.9 K/uL (ref 4.0–10.5)
nRBC: 0 % (ref 0.0–0.2)

## 2024-10-23 LAB — TROPONIN I (HIGH SENSITIVITY)
Troponin I (High Sensitivity): 4 ng/L (ref <18)
Troponin I (High Sensitivity): 3 ng/L (ref <18)

## 2024-10-23 LAB — RESP PANEL BY RT-PCR (RSV, FLU A&B, COVID)  RVPGX2
Influenza A by PCR: NEGATIVE
Influenza B by PCR: NEGATIVE
Resp Syncytial Virus by PCR: NEGATIVE
SARS Coronavirus 2 by RT PCR: NEGATIVE

## 2024-10-23 LAB — BRAIN NATRIURETIC PEPTIDE: B Natriuretic Peptide: 15.5 pg/mL (ref 0.0–100.0)

## 2024-10-23 MED ORDER — ALBUTEROL SULFATE HFA 108 (90 BASE) MCG/ACT IN AERS
2.0000 | INHALATION_SPRAY | Freq: Once | RESPIRATORY_TRACT | Status: AC
  Administered 2024-10-23: 2 via RESPIRATORY_TRACT
  Filled 2024-10-23: qty 6.7

## 2024-10-23 MED ORDER — METHYLPREDNISOLONE SODIUM SUCC 125 MG IJ SOLR
125.0000 mg | Freq: Once | INTRAMUSCULAR | Status: AC
  Administered 2024-10-23: 125 mg via INTRAVENOUS
  Filled 2024-10-23: qty 2

## 2024-10-23 MED ORDER — ALBUTEROL SULFATE (2.5 MG/3ML) 0.083% IN NEBU
2.5000 mg | INHALATION_SOLUTION | Freq: Four times a day (QID) | RESPIRATORY_TRACT | 0 refills | Status: DC | PRN
  Filled 2024-10-23: qty 75, 7d supply, fill #0

## 2024-10-23 MED ORDER — PREDNISONE 10 MG PO TABS
40.0000 mg | ORAL_TABLET | Freq: Every day | ORAL | 0 refills | Status: AC
  Filled 2024-10-23: qty 16, 4d supply, fill #0

## 2024-10-23 MED ORDER — IPRATROPIUM-ALBUTEROL 0.5-2.5 (3) MG/3ML IN SOLN
3.0000 mL | Freq: Once | RESPIRATORY_TRACT | Status: AC
  Administered 2024-10-23: 3 mL via RESPIRATORY_TRACT
  Filled 2024-10-23: qty 3

## 2024-10-23 NOTE — ED Notes (Signed)
 Pt removed bi-pap, states that she feels better, back to her baseline. RT and MD notified. Pt educated on why she is not able to eat at this moment

## 2024-10-23 NOTE — ED Notes (Signed)
 CCMD Called

## 2024-10-23 NOTE — Discharge Instructions (Signed)
 Take your albuterol  as needed and your steroids as prescribed. It is important that you follow up with your primary care doctor in 1-2 weeks.

## 2024-10-23 NOTE — ED Notes (Signed)
 X-ray at bedside.

## 2024-10-23 NOTE — ED Triage Notes (Signed)
 C/o sob onset 3 am  able to speak in 2-32 work sentences wears 3 Liters/  at home without medication s

## 2024-10-23 NOTE — ED Provider Notes (Signed)
 [place] EMERGENCY DEPARTMENT AT [facility] Provider Note   CSN: [id number] Arrival date & time: 10/23/24  9089     Patient presents with: Shortness of Breath   Deborah Melendez is a 48 y.o. female.   48 year old female presents for evaluation of shortness of breath. Patient is on 3L [place] at baseline and came to the ER not on any oxygen . Is yelling, moaning, gasping for air. Appears uncomfortable. States she has a history of COPD and ran out of her prednisone  and nebulizers. Denies any chest pain, but does admit increased cough and wheezing.    Shortness of Breath Associated symptoms: cough   Associated symptoms: no abdominal pain, no chest pain, no ear pain, no fever, no rash, no sore throat and no vomiting        Prior to Admission medications   Medication Sig Start Date End Date Taking? Authorizing Provider  albuterol  (PROVENTIL ) (2.5 MG/3ML) 0.083% nebulizer solution Take 3 mLs (2.5 mg total) by nebulization every 6 (six) hours as needed for wheezing or shortness of breath. 10/23/24 11/22/24 Yes Cloee Dunwoody L, DO  predniSONE  (DELTASONE ) 10 MG tablet Take 4 tablets (40 mg total) by mouth daily for 4 days. 10/23/24 10/27/24 Yes Dakwon Wenberg L, DO  albuterol  (VENTOLIN  HFA) 108 (90 Base) MCG/ACT inhaler Inhale 2 puffs into the lungs every 6 (six) hours as needed for wheezing or shortness of breath. 06/18/20   Aslam, Sadia, MD  bismuth subsalicylate (PEPTO BISMOL) 262 MG/15ML suspension Take 30 mLs by mouth every 6 (six) hours as needed.    [provider]  DULoxetine  (CYMBALTA ) 30 MG capsule Take 1 capsule (30 mg total) by mouth daily. Patient not taking: Reported on 07/11/2020 06/21/20 06/21/21  Lang Dover, MD  ferrous sulfate  325 (65 FE) MG tablet Take 1 tablet (325 mg total) by mouth daily with breakfast. 06/18/20   Aslam, Sadia, MD  ferumoxytol  (FERAHEME ) 510 MG/17ML SOLN injection Inject 17 mLs (510 mg total) into the vein once for 1 dose.  07/30/20 07/30/20  Zina Jerilynn LABOR, MD  Fluticasone -Salmeterol (ADVAIR) 100-50 MCG/DOSE AEPB Inhale 1 puff into the lungs 2 (two) times daily. 06/18/20   Aslam, Sadia, MD  megestrol  (MEGACE ) 40 MG tablet Take 1 tablet (40 mg total) by mouth daily. Patient not taking: Reported on 07/11/2020 06/18/20   Aslam, Sadia, MD  megestrol  (MEGACE ) 40 MG tablet Take 1 tablet (40 mg total) by mouth daily. 07/11/20   Zina Jerilynn LABOR, MD  OLANZapine  (ZYPREXA ) 10 MG tablet Take 1 tablet (10 mg total) by mouth at bedtime. Patient not taking: Reported on 07/11/2020 06/21/20 06/21/21  Lang Dover, MD  pantoprazole  (PROTONIX ) 40 MG tablet Take 1 tablet (40 mg total) by mouth daily. Patient not taking: Reported on 07/11/2020 06/18/20   Aslam, Sadia, MD    Allergies: Patient has no known allergies.    Review of Systems  Constitutional:  Negative for chills and fever.  HENT:  Negative for ear pain and sore throat.   Eyes:  Negative for pain and visual disturbance.  Respiratory:  Positive for cough and shortness of breath.   Cardiovascular:  Negative for chest pain and palpitations.  Gastrointestinal:  Negative for abdominal pain and vomiting.  Genitourinary:  Negative for dysuria and hematuria.  Musculoskeletal:  Negative for arthralgias and back pain.  Skin:  Negative for color change and rash.  Neurological:  Negative for seizures and syncope.  All other systems reviewed and are negative.   Updated Vital  Signs BP (!) 139/93   Pulse 93   Temp (!) 97.5 F (36.4 C) (Oral)   Resp (!) 22   Ht 5' 4 (1.626 m)   Wt 68 kg   SpO2 100%   BMI 25.75 kg/m   Physical Exam Vitals and nursing note reviewed.  Constitutional:      General: She is in acute distress.     Appearance: She is ill-appearing.  HENT:     Head: Normocephalic and atraumatic.  Eyes:     Conjunctiva/sclera: Conjunctivae normal.  Cardiovascular:     Rate and Rhythm: Regular rhythm. Tachycardia present.     Heart sounds: No murmur  heard. Pulmonary:     Effort: Tachypnea and respiratory distress present.     Breath sounds: Decreased breath sounds and wheezing present.  Abdominal:     Palpations: Abdomen is soft.     Tenderness: There is no abdominal tenderness.  Musculoskeletal:        General: No swelling.     Cervical back: Neck supple.  Skin:    General: Skin is warm and dry.     Capillary Refill: Capillary refill takes less than 2 seconds.  Neurological:     Mental Status: She is alert.  Psychiatric:        Mood and Affect: Mood normal.     (all labs ordered are listed, but only abnormal results are displayed) Labs Reviewed  BASIC METABOLIC PANEL WITH GFR - Abnormal; Notable for the following components:      Result Value   CO2 18 (*)    Glucose, Bld 109 (*)    All other components within normal limits  CBC WITH DIFFERENTIAL/PLATELET - Abnormal; Notable for the following components:   Hemoglobin 15.2 (*)    All other components within normal limits  RESP PANEL BY RT-PCR (RSV, FLU A&B, COVID)  RVPGX2  BRAIN NATRIURETIC PEPTIDE  TROPONIN I (HIGH SENSITIVITY)  TROPONIN I (HIGH SENSITIVITY)    EKG: EKG Interpretation Date/Time:  Monday October 23 2024 10:00:32 EDT Ventricular Rate:  87 PR Interval:  127 QRS Duration:  99 QT Interval:  361 QTC Calculation: 435 R Axis:   87  Text Interpretation: Sinus rhythm Probable left ventricular hypertrophy Compared with previous EKG from 06/17/2020 Confirmed by Gennaro Bouchard ([id number]) on 10/23/2024 10:03:24 AM  Radiology: DG Chest 1 View Result Date: 10/23/2024 EXAM: 1 VIEW XRAY OF THE CHEST 10/23/2024 09:56:00 AM COMPARISON: None available. CLINICAL HISTORY: Shortness of breath. FINDINGS: LUNGS AND PLEURA: No focal pulmonary opacity. No pulmonary edema. No pleural effusion. No pneumothorax. Apical lordotic projection. HEART AND MEDIASTINUM: No acute abnormality of the cardiac and mediastinal silhouettes. BONES AND SOFT TISSUES: No acute osseous  abnormality. IMPRESSION: 1. No acute cardiopulmonary process. Electronically signed by: Ryan Salvage MD 10/23/2024 10:30 AM EDT RP Workstation: [id number]     Procedures   Medications Ordered in the ED  ipratropium-albuterol  (DUONEB) 0.5-2.5 (3) MG/3ML nebulizer solution 3 mL (3 mLs Nebulization Given by Other 10/23/24 0945)  methylPREDNISolone  sodium succinate (SOLU-MEDROL ) 125 mg/2 mL injection 125 mg (125 mg Intravenous Given by Other 10/23/24 0945)  albuterol  (VENTOLIN  HFA) 108 (90 Base) MCG/ACT inhaler 2 puff (2 puffs Inhalation Given 10/23/24 1204)                                    Medical Decision Making Cardiac monitor interpretation: Sinus rhythm, no ectopy  Social determinants of health: Patient  with poor follow-up, lost her prescription card, has been noncompliant with her medications  Patient here for shortness of breath and wheezing.  In extremis on initial exam, moaning, tachypneic in respiratory distress and we found out that she drove here without any portable oxygen .  Immediately placed on 3 L nasal cannula which is her baseline.  Given a breathing treatment and steroids.  She was also placed on BiPAP.  After about an hour and a half on BiPAP and the breathing treatment she is much improved, oxygen  saturations are within normal limits and she is asking to go home.  She appears well is able to ambulate without difficulty I think this is reasonable.  We will refill her albuterol  Nebules and I will put her on steroids for a few more days.  We will find her prescription card.  Advise close up with primary care and otherwise return to the ER for new or worsening symptoms.  She feels comfortable with the plan to be discharged home  Problems Addressed: COPD exacerbation (HCC): acute illness or injury Dyspnea, unspecified type: acute illness or injury  Amount and/or Complexity of Data Reviewed External Data Reviewed: notes.    Details: Prior ED records reviewed and patient  with history of noncompliance with her medications Labs: ordered. Decision-making details documented in ED Course.    Details: Ordered and reviewed by me and unremarkable Radiology: ordered and independent interpretation performed. Decision-making details documented in ED Course.    Details: Ordered and interpreted me independently of radiology Chest x-ray: Shows no acute abnormality in the chest ECG/medicine tests: ordered and independent interpretation performed. Decision-making details documented in ED Course.    Details: Ordered and interpreted by me in the absence of cardiology shows sinus rhythm, no STEMI or significant change when compared to prior  Risk OTC drugs. Prescription drug management. Parenteral controlled substances. Drug therapy requiring intensive monitoring for toxicity. Diagnosis or treatment significantly limited by social determinants of health. Risk Details: CRITICAL CARE Performed by: Duwaine LITTIE Fusi   Total critical care time: 30 minutes  Critical care time was exclusive of separately billable procedures and treating other patients.  Critical care was necessary to treat or prevent imminent or life-threatening deterioration.  Critical care was time spent personally by me on the following activities: development of treatment plan with patient and/or surrogate as well as nursing, discussions with consultants, evaluation of patient's response to treatment, examination of patient, obtaining history from patient or surrogate, ordering and performing treatments and interventions, ordering and review of laboratory studies, ordering and review of radiographic studies, pulse oximetry and re-evaluation of patient's condition.   Critical Care Total time providing critical care: 30 minutes    Final diagnoses:  COPD exacerbation (HCC)  Dyspnea, unspecified type    ED Discharge Orders          Ordered    predniSONE  (DELTASONE ) 10 MG tablet  Daily        10/23/24  1137    albuterol  (PROVENTIL ) (2.5 MG/3ML) 0.083% nebulizer solution  Every 6 hours PRN        10/23/24 1137               Lurine Imel L, DO 10/23/24 1353

## 2024-10-26 ENCOUNTER — Other Ambulatory Visit: Payer: Self-pay

## 2024-11-01 ENCOUNTER — Other Ambulatory Visit: Payer: Self-pay

## 2024-11-28 ENCOUNTER — Other Ambulatory Visit: Payer: Self-pay

## 2024-11-28 ENCOUNTER — Emergency Department (HOSPITAL_COMMUNITY)
Admission: EM | Admit: 2024-11-28 | Discharge: 2024-11-28 | Disposition: A | Discharge status: Home / Self Care | Payer: MEDICAID | Attending: Emergency Medicine | Admitting: Emergency Medicine

## 2024-11-28 ENCOUNTER — Encounter (HOSPITAL_COMMUNITY): Payer: Self-pay

## 2024-11-28 ENCOUNTER — Emergency Department (HOSPITAL_COMMUNITY): Payer: MEDICAID

## 2024-11-28 DIAGNOSIS — J441 Chronic obstructive pulmonary disease with (acute) exacerbation: Secondary | ICD-10-CM | POA: Insufficient documentation

## 2024-11-28 DIAGNOSIS — F17200 Nicotine dependence, unspecified, uncomplicated: Secondary | ICD-10-CM | POA: Insufficient documentation

## 2024-11-28 LAB — BASIC METABOLIC PANEL WITH GFR
Anion gap: 9 (ref 5–15)
BUN: 18 mg/dL (ref 6–20)
CO2: 23 mmol/L (ref 22–32)
Calcium: 9 mg/dL (ref 8.9–10.3)
Chloride: 107 mmol/L (ref 98–111)
Creatinine, Ser: 1.03 mg/dL — ABNORMAL HIGH (ref 0.44–1.00)
GFR, Estimated: >60 mL/min (ref >60)
Glucose, Bld: 98 mg/dL (ref 70–99)
Potassium: 4.2 mmol/L (ref 3.5–5.1)
Sodium: 139 mmol/L (ref 135–145)

## 2024-11-28 LAB — HCG, SERUM, QUALITATIVE: Preg, Serum: NEGATIVE

## 2024-11-28 LAB — CBC
HCT: 45.6 % (ref 36.0–46.0)
Hemoglobin: 15.2 g/dL — ABNORMAL HIGH (ref 12.0–15.0)
MCH: 32.5 pg (ref 26.0–34.0)
MCHC: 33.3 g/dL (ref 30.0–36.0)
MCV: 97.4 fL (ref 80.0–100.0)
Platelets: 207 K/uL (ref 150–400)
RBC: 4.68 MIL/uL (ref 3.87–5.11)
RDW: 14 % (ref 11.5–15.5)
WBC: 6.1 K/uL (ref 4.0–10.5)
nRBC: 0 % (ref 0.0–0.2)

## 2024-11-28 MED ORDER — IPRATROPIUM-ALBUTEROL 0.5-2.5 (3) MG/3ML IN SOLN
6.0000 mL | Freq: Once | RESPIRATORY_TRACT | Status: AC
  Administered 2024-11-28: 6 mL via RESPIRATORY_TRACT
  Filled 2024-11-28: qty 3

## 2024-11-28 MED ORDER — PREDNISONE 20 MG PO TABS
40.0000 mg | ORAL_TABLET | Freq: Every day | ORAL | 0 refills | Status: AC
  Filled 2024-11-28 (×2): qty 10, 5d supply, fill #0

## 2024-11-28 MED ORDER — ALBUTEROL SULFATE HFA 108 (90 BASE) MCG/ACT IN AERS
2.0000 | INHALATION_SPRAY | RESPIRATORY_TRACT | Status: DC | PRN
  Administered 2024-11-28: 2 via RESPIRATORY_TRACT
  Filled 2024-11-28: qty 6.7

## 2024-11-28 MED ORDER — LISINOPRIL 10 MG PO TABS
10.0000 mg | ORAL_TABLET | Freq: Every day | ORAL | 0 refills | Status: AC
  Filled 2024-11-28 (×2): qty 30, 30d supply, fill #0

## 2024-11-28 MED ORDER — MAGNESIUM SULFATE 2 GM/50ML IV SOLN
2.0000 g | Freq: Once | INTRAVENOUS | Status: AC
  Administered 2024-11-28: 2 g via INTRAVENOUS
  Filled 2024-11-28: qty 50

## 2024-11-28 MED ORDER — ALBUTEROL SULFATE HFA 108 (90 BASE) MCG/ACT IN AERS
1.0000 | INHALATION_SPRAY | Freq: Four times a day (QID) | RESPIRATORY_TRACT | 0 refills | Status: DC | PRN
  Filled 2024-11-28: qty 6.7, 25d supply, fill #0

## 2024-11-28 MED ORDER — LACTATED RINGERS IV BOLUS
1000.0000 mL | Freq: Once | INTRAVENOUS | Status: AC
  Administered 2024-11-28: 1000 mL via INTRAVENOUS

## 2024-11-28 MED ORDER — IPRATROPIUM-ALBUTEROL 0.5-2.5 (3) MG/3ML IN SOLN
3.0000 mL | Freq: Once | RESPIRATORY_TRACT | Status: DC
  Filled 2024-11-28: qty 3

## 2024-11-28 MED ORDER — AZITHROMYCIN 250 MG PO TABS
250.0000 mg | ORAL_TABLET | Freq: Every day | ORAL | 0 refills | Status: DC
  Filled 2024-11-28: qty 6, 5d supply, fill #0
  Filled 2024-11-28: qty 6, 6d supply, fill #0

## 2024-11-28 MED ORDER — METHYLPREDNISOLONE SODIUM SUCC 125 MG IJ SOLR
125.0000 mg | Freq: Once | INTRAMUSCULAR | Status: AC
  Administered 2024-11-28: 125 mg via INTRAVENOUS
  Filled 2024-11-28: qty 2

## 2024-11-28 NOTE — ED Notes (Signed)
 Pt provided with food and drink

## 2024-11-28 NOTE — ED Notes (Signed)
 Pt states she has not been using a nebulizer at home because she ran out of medication. Pt also states she has not taken her BP medication in months due to moving here and not being able to get to a doctor. Pt states she has O2 at home that she is supposed to use but hasn't been using it. She is a smoker and states she smoked a lot of cigarettes through Thanksgiving.

## 2024-11-28 NOTE — ED Triage Notes (Signed)
 Pt arrived via POV c/o sob. Pt noted to be yelling in triage.

## 2024-11-28 NOTE — ED Notes (Signed)
 Pt placed on 3L [place] for her comfort

## 2024-11-28 NOTE — ED Provider Notes (Signed)
 [facility] Provider Note   CSN: [id number] Arrival date & time: 11/28/24  0219     History Chief Complaint  Patient presents with   Shortness of Breath    HPI Deborah Melendez is a 48 y.o. female presenting for fever cough and SOB. HX COPD. States she has been smoking more because of the holidays. Wears 3L at baseline. States that she moved from [place] and loss access to her PCP  Patient's recorded medical, surgical, social, medication list and allergies were reviewed in the Snapshot window as part of the initial history.   Review of Systems   Review of Systems  Constitutional:  Negative for chills and fever.  HENT:  Negative for ear pain and sore throat.   Eyes:  Negative for pain and visual disturbance.  Respiratory:  Positive for cough, shortness of breath and wheezing.   Cardiovascular:  Negative for chest pain and palpitations.  Gastrointestinal:  Negative for abdominal pain and vomiting.  Genitourinary:  Negative for dysuria and hematuria.  Musculoskeletal:  Negative for arthralgias and back pain.  Skin:  Negative for color change and rash.  Neurological:  Negative for seizures and syncope.  All other systems reviewed and are negative.   Physical Exam Updated Vital Signs BP (!) 147/119 (BP Location: Right Arm)   Pulse (!) 106   Temp 98.1 F (36.7 C) (Oral)   Resp 20   Ht 5' 4 (1.626 m)   Wt 68 kg   LMP  (LMP Unknown)   SpO2 100%   BMI 25.75 kg/m  Physical Exam Vitals and nursing note reviewed.  Constitutional:      General: She is not in acute distress.    Appearance: She is well-developed.  HENT:     Head: Normocephalic and atraumatic.  Eyes:     Conjunctiva/sclera: Conjunctivae normal.  Cardiovascular:     Rate and Rhythm: Normal rate and regular rhythm.     Heart sounds: No murmur heard. Pulmonary:     Effort: Pulmonary effort is normal. Tachypnea present. No respiratory distress.     Breath  sounds: Wheezing and rhonchi present.  Abdominal:     Palpations: Abdomen is soft.     Tenderness: There is no abdominal tenderness.  Musculoskeletal:        General: No swelling.     Cervical back: Neck supple.  Skin:    General: Skin is warm and dry.     Capillary Refill: Capillary refill takes less than 2 seconds.  Neurological:     Mental Status: She is alert.  Psychiatric:        Mood and Affect: Mood normal.      ED Course/ Medical Decision Making/ A&P    Procedures Procedures   Medications Ordered in ED Medications  albuterol  (VENTOLIN  HFA) 108 (90 Base) MCG/ACT inhaler 2 puff (2 puffs Inhalation Given 11/28/24 0334)  methylPREDNISolone  sodium succinate (SOLU-MEDROL ) 125 mg/2 mL injection 125 mg (125 mg Intravenous Given 11/28/24 0432)  magnesium  sulfate IVPB 2 g 50 mL (0 g Intravenous Stopped 11/28/24 0538)  ipratropium-albuterol  (DUONEB) 0.5-2.5 (3) MG/3ML nebulizer solution 6 mL (6 mLs Nebulization Given 11/28/24 0426)  lactated ringers  bolus 1,000 mL (0 mLs Intravenous Stopped 11/28/24 0541)   Medical Decision Making:   Deborah Melendez is a 48 y.o. female with a history of COPD, who presented to the ED today with acute on chronic SOB. They are endorsing worsening of their baseline dyspnea over the past  24 hours. Their baseline is a 3L O2 requirement. At their baseline they are able to get around the house and they are not able to at this time.   On my initial exam, the pt was SOB and tachypneic. Audible wheezing and grossly decreased breath sounds appreciated.  They are endorsing increased sputum production.    Reviewed and confirmed nursing documentation for past medical history, family history, social history.    Initial Assessment:   With the patient's presentation of SOB in the above setting, most likely diagnosis is COPD Exacerbation. Other diagnoses were considered including (but not limited to) CAP, PE, ACS, viral infection, PTX. These are considered less  likely due to history of present illness and physical exam findings.   This is most consistent with an acute life/limb threatening illness complicated by underlying chronic conditions.  Initial Plan:  Empiric treatment of patient's symptoms with immediate initiation of inhaled bronchodilators and IV steroids. Given advanced nature of patient's presentation, will proceed with IV magnesium  as a rescue therapy.   Evaluation for ACS with EKG and delta troponin  Evaluation for infectious versus intrathoracic abnormality with chest x-ray  Evaluation for volume overload with BNP  Screening labs including CBC and Metabolic panel to evaluate for infectious or metabolic etiology of disease.  Patient's Wells score is low and patient does not warrant further objective evaluation for PE based on consistency of presentation of alternative diagnosis.  Objective evaluation as below reviewed   Initial Study Results:   Laboratory  All laboratory results reviewed without evidence of clinically relevant pathology.    EKG EKG was reviewed independently. Rate, rhythm, axis, intervals all examined and without medically relevant abnormality. ST segments without concerns for elevations.    Radiology:  All images reviewed independently. Agree with radiology report at this time.   DG Chest 2 View Result Date: 11/28/2024 EXAM: 2 VIEW(S) XRAY OF THE CHEST 11/28/2024 03:28:00 AM COMPARISON: 10/23/2024 CLINICAL HISTORY: sob FINDINGS: LUNGS AND PLEURA: No focal pulmonary opacity. No pleural effusion. No pneumothorax. HEART AND MEDIASTINUM: No acute abnormality of the cardiac and mediastinal silhouettes. BONES AND SOFT TISSUES: No acute osseous abnormality. IMPRESSION: 1. No acute cardiopulmonary process. Electronically signed by: Norman Gatlin MD 11/28/2024 03:37 AM EST RP Workstation: [id number]     .   Final Assessment and Plan:   After initiation of medical therapies, patient is grossly improved and no longer in  acute distress.   Disposition:  I have considered need for hospitalization, however, considering all of the above, I believe this patient is stable for discharge at this time.  Patient/family educated about specific return precautions for given chief complaint and symptoms.  Patient/family educated about follow-up with PCP.     Patient/family expressed understanding of return precautions and need for follow-up. Patient spoken to regarding all imaging and laboratory results and appropriate follow up for these results. All education provided in verbal form with additional information in written form. Time was allowed for answering of patient questions. Patient discharged.    Emergency Department Medication Summary:   Medications  albuterol  (VENTOLIN  HFA) 108 (90 Base) MCG/ACT inhaler 2 puff (2 puffs Inhalation Given 11/28/24 0334)  methylPREDNISolone  sodium succinate (SOLU-MEDROL ) 125 mg/2 mL injection 125 mg (125 mg Intravenous Given 11/28/24 0432)  magnesium  sulfate IVPB 2 g 50 mL (0 g Intravenous Stopped 11/28/24 0538)  ipratropium-albuterol  (DUONEB) 0.5-2.5 (3) MG/3ML nebulizer solution 6 mL (6 mLs Nebulization Given 11/28/24 0426)  lactated ringers  bolus 1,000 mL (0 mLs Intravenous Stopped 11/28/24 0541)  Clinical Impression:  1. COPD exacerbation [facility])      Discharge   Final Clinical Impression(s) / ED Diagnoses Final diagnoses:  COPD exacerbation (HCC)    Rx / DC Orders ED Discharge Orders          Ordered    albuterol  (VENTOLIN  HFA) 108 (90 Base) MCG/ACT inhaler  Every 6 hours PRN        11/28/24 0552    predniSONE  (DELTASONE ) 20 MG tablet  Daily        11/28/24 0552    azithromycin  (ZITHROMAX ) 250 MG tablet  Daily        11/28/24 0552    lisinopril (ZESTRIL) 10 MG tablet  Daily        11/28/24 0552              Alla Sloma, MD 11/28/24 [phone]

## 2024-12-24 ENCOUNTER — Inpatient Hospital Stay (HOSPITAL_COMMUNITY)
Admission: EM | Admit: 2024-12-24 | Discharge: 2024-12-29 | DRG: 871 | Disposition: A | Discharge status: Home / Self Care | Payer: MEDICAID | Attending: Internal Medicine | Admitting: Internal Medicine

## 2024-12-24 ENCOUNTER — Encounter (HOSPITAL_COMMUNITY): Payer: Self-pay

## 2024-12-24 ENCOUNTER — Other Ambulatory Visit: Payer: Self-pay

## 2024-12-24 ENCOUNTER — Emergency Department (HOSPITAL_COMMUNITY): Payer: MEDICAID

## 2024-12-24 DIAGNOSIS — Z91198 Patient's noncompliance with other medical treatment and regimen for other reason: Secondary | ICD-10-CM

## 2024-12-24 DIAGNOSIS — F419 Anxiety disorder, unspecified: Secondary | ICD-10-CM | POA: Diagnosis present

## 2024-12-24 DIAGNOSIS — Z813 Family history of other psychoactive substance abuse and dependence: Secondary | ICD-10-CM

## 2024-12-24 DIAGNOSIS — R0602 Shortness of breath: Secondary | ICD-10-CM

## 2024-12-24 DIAGNOSIS — Z9981 Dependence on supplemental oxygen: Secondary | ICD-10-CM

## 2024-12-24 DIAGNOSIS — J129 Viral pneumonia, unspecified: Secondary | ICD-10-CM | POA: Diagnosis present

## 2024-12-24 DIAGNOSIS — F17200 Nicotine dependence, unspecified, uncomplicated: Secondary | ICD-10-CM | POA: Diagnosis present

## 2024-12-24 DIAGNOSIS — J102 Influenza due to other identified influenza virus with gastrointestinal manifestations: Secondary | ICD-10-CM | POA: Diagnosis present

## 2024-12-24 DIAGNOSIS — Z1152 Encounter for screening for COVID-19: Secondary | ICD-10-CM

## 2024-12-24 DIAGNOSIS — I10 Essential (primary) hypertension: Secondary | ICD-10-CM | POA: Diagnosis present

## 2024-12-24 DIAGNOSIS — R0902 Hypoxemia: Principal | ICD-10-CM

## 2024-12-24 DIAGNOSIS — J440 Chronic obstructive pulmonary disease with acute lower respiratory infection: Secondary | ICD-10-CM | POA: Diagnosis present

## 2024-12-24 DIAGNOSIS — J1008 Influenza due to other identified influenza virus with other specified pneumonia: Secondary | ICD-10-CM | POA: Diagnosis present

## 2024-12-24 DIAGNOSIS — Z7951 Long term (current) use of inhaled steroids: Secondary | ICD-10-CM

## 2024-12-24 DIAGNOSIS — Z91138 Patient's unintentional underdosing of medication regimen for other reason: Secondary | ICD-10-CM

## 2024-12-24 DIAGNOSIS — Z716 Tobacco abuse counseling: Secondary | ICD-10-CM

## 2024-12-24 DIAGNOSIS — J441 Chronic obstructive pulmonary disease with (acute) exacerbation: Secondary | ICD-10-CM | POA: Diagnosis present

## 2024-12-24 DIAGNOSIS — Z79899 Other long term (current) drug therapy: Secondary | ICD-10-CM

## 2024-12-24 DIAGNOSIS — J45901 Unspecified asthma with (acute) exacerbation: Secondary | ICD-10-CM | POA: Diagnosis present

## 2024-12-24 DIAGNOSIS — A419 Sepsis, unspecified organism: Secondary | ICD-10-CM | POA: Diagnosis present

## 2024-12-24 DIAGNOSIS — Z8249 Family history of ischemic heart disease and other diseases of the circulatory system: Secondary | ICD-10-CM

## 2024-12-24 DIAGNOSIS — F1410 Cocaine abuse, uncomplicated: Secondary | ICD-10-CM | POA: Diagnosis present

## 2024-12-24 DIAGNOSIS — F17210 Nicotine dependence, cigarettes, uncomplicated: Secondary | ICD-10-CM | POA: Diagnosis present

## 2024-12-24 DIAGNOSIS — Z818 Family history of other mental and behavioral disorders: Secondary | ICD-10-CM

## 2024-12-24 DIAGNOSIS — F319 Bipolar disorder, unspecified: Secondary | ICD-10-CM | POA: Diagnosis present

## 2024-12-24 DIAGNOSIS — J9621 Acute and chronic respiratory failure with hypoxia: Secondary | ICD-10-CM | POA: Diagnosis present

## 2024-12-24 DIAGNOSIS — Z833 Family history of diabetes mellitus: Secondary | ICD-10-CM

## 2024-12-24 DIAGNOSIS — J9601 Acute respiratory failure with hypoxia: Secondary | ICD-10-CM

## 2024-12-24 DIAGNOSIS — Z789 Other specified health status: Secondary | ICD-10-CM

## 2024-12-24 DIAGNOSIS — B974 Respiratory syncytial virus as the cause of diseases classified elsewhere: Secondary | ICD-10-CM | POA: Diagnosis present

## 2024-12-24 DIAGNOSIS — T486X6A Underdosing of antiasthmatics, initial encounter: Secondary | ICD-10-CM | POA: Diagnosis present

## 2024-12-24 DIAGNOSIS — A4189 Other specified sepsis: Principal | ICD-10-CM | POA: Diagnosis present

## 2024-12-24 DIAGNOSIS — D751 Secondary polycythemia: Secondary | ICD-10-CM | POA: Diagnosis present

## 2024-12-24 DIAGNOSIS — R062 Wheezing: Secondary | ICD-10-CM

## 2024-12-24 LAB — DIFFERENTIAL
Abs Immature Granulocytes: 0.02 K/uL (ref 0.00–0.07)
Basophils Absolute: 0 K/uL (ref 0.0–0.1)
Basophils Relative: 0 %
Eosinophils Absolute: 0 K/uL (ref 0.0–0.5)
Eosinophils Relative: 0 %
Immature Granulocytes: 0 %
Lymphocytes Relative: 6 %
Lymphs Abs: 0.3 K/uL — ABNORMAL LOW (ref 0.7–4.0)
Monocytes Absolute: 0.1 K/uL (ref 0.1–1.0)
Monocytes Relative: 2 %
Neutro Abs: 4.1 K/uL (ref 1.7–7.7)
Neutrophils Relative %: 92 %

## 2024-12-24 LAB — CBC WITH DIFFERENTIAL/PLATELET
Abs Immature Granulocytes: 0.02 K/uL (ref 0.00–0.07)
Basophils Absolute: 0 K/uL (ref 0.0–0.1)
Basophils Relative: 0 %
Eosinophils Absolute: 0 K/uL (ref 0.0–0.5)
Eosinophils Relative: 0 %
HCT: 41.3 % (ref 36.0–46.0)
Hemoglobin: 13.8 g/dL (ref 12.0–15.0)
Immature Granulocytes: 1 %
Lymphocytes Relative: 6 %
Lymphs Abs: 0.3 K/uL — ABNORMAL LOW (ref 0.7–4.0)
MCH: 32.1 pg (ref 26.0–34.0)
MCHC: 33.4 g/dL (ref 30.0–36.0)
MCV: 96 fL (ref 80.0–100.0)
Monocytes Absolute: 0.1 K/uL (ref 0.1–1.0)
Monocytes Relative: 2 %
Neutro Abs: 4 K/uL (ref 1.7–7.7)
Neutrophils Relative %: 91 %
Platelets: 160 K/uL (ref 150–400)
RBC: 4.3 MIL/uL (ref 3.87–5.11)
RDW: 13.6 % (ref 11.5–15.5)
WBC: 4.4 K/uL (ref 4.0–10.5)
nRBC: 0 % (ref 0.0–0.2)

## 2024-12-24 LAB — BASIC METABOLIC PANEL WITH GFR
Anion gap: 15 (ref 5–15)
BUN: 10 mg/dL (ref 6–20)
CO2: 19 mmol/L — ABNORMAL LOW (ref 22–32)
Calcium: 9.2 mg/dL (ref 8.9–10.3)
Chloride: 98 mmol/L (ref 98–111)
Creatinine, Ser: 0.96 mg/dL (ref 0.44–1.00)
GFR, Estimated: >60 mL/min
Glucose, Bld: 103 mg/dL — ABNORMAL HIGH (ref 70–99)
Potassium: 4 mmol/L (ref 3.5–5.1)
Sodium: 132 mmol/L — ABNORMAL LOW (ref 135–145)

## 2024-12-24 LAB — URINALYSIS, W/ REFLEX TO CULTURE (INFECTION SUSPECTED)
Bilirubin Urine: NEGATIVE
Glucose, UA: NEGATIVE mg/dL
Hgb urine dipstick: NEGATIVE
Ketones, ur: 20 mg/dL — AB
Leukocytes,Ua: NEGATIVE
Nitrite: NEGATIVE
Protein, ur: NEGATIVE mg/dL
Specific Gravity, Urine: 1.006 (ref 1.005–1.030)
pH: 6 (ref 5.0–8.0)

## 2024-12-24 LAB — CBC
HCT: 48.1 % — ABNORMAL HIGH (ref 36.0–46.0)
Hemoglobin: 16.3 g/dL — ABNORMAL HIGH (ref 12.0–15.0)
MCH: 32.1 pg (ref 26.0–34.0)
MCHC: 33.9 g/dL (ref 30.0–36.0)
MCV: 94.7 fL (ref 80.0–100.0)
Platelets: 207 K/uL (ref 150–400)
RBC: 5.08 MIL/uL (ref 3.87–5.11)
RDW: 13.4 % (ref 11.5–15.5)
WBC: 4.3 K/uL (ref 4.0–10.5)
nRBC: 0 % (ref 0.0–0.2)

## 2024-12-24 LAB — HEPATIC FUNCTION PANEL
ALT: 27 U/L (ref 0–44)
AST: 48 U/L — ABNORMAL HIGH (ref 15–41)
Albumin: 4.4 g/dL (ref 3.5–5.0)
Alkaline Phosphatase: 90 U/L (ref 38–126)
Bilirubin, Direct: 0.2 mg/dL (ref 0.0–0.2)
Indirect Bilirubin: 0.3 mg/dL (ref 0.3–0.9)
Total Bilirubin: 0.5 mg/dL (ref 0.0–1.2)
Total Protein: 8 g/dL (ref 6.5–8.1)

## 2024-12-24 LAB — TROPONIN T, HIGH SENSITIVITY: Troponin T High Sensitivity: <15 ng/L (ref 0–19)

## 2024-12-24 LAB — I-STAT CG4 LACTIC ACID, ED
Lactic Acid, Venous: 1.4 mmol/L (ref 0.5–1.9)
Lactic Acid, Venous: 1.7 mmol/L (ref 0.5–1.9)
Lactic Acid, Venous: 1.7 mmol/L (ref 0.5–1.9)

## 2024-12-24 LAB — RESP PANEL BY RT-PCR (RSV, FLU A&B, COVID)  RVPGX2
Influenza A by PCR: NEGATIVE
Influenza B by PCR: POSITIVE — AB
Resp Syncytial Virus by PCR: POSITIVE — AB
SARS Coronavirus 2 by RT PCR: NEGATIVE

## 2024-12-24 LAB — PROCALCITONIN: Procalcitonin: 0.14 ng/mL

## 2024-12-24 LAB — PROTIME-INR
INR: 1 (ref 0.8–1.2)
Prothrombin Time: 14 s (ref 11.4–15.2)

## 2024-12-24 LAB — HCG, SERUM, QUALITATIVE: Preg, Serum: NEGATIVE

## 2024-12-24 MED ORDER — MAGNESIUM SULFATE 2 GM/50ML IV SOLN
2.0000 g | Freq: Once | INTRAVENOUS | Status: AC
  Administered 2024-12-24: 2 g via INTRAVENOUS
  Filled 2024-12-24: qty 50

## 2024-12-24 MED ORDER — MAGNESIUM SULFATE 2 GM/50ML IV SOLN
INTRAVENOUS | Status: AC
  Filled 2024-12-24: qty 50

## 2024-12-24 MED ORDER — LACTATED RINGERS IV SOLN: INTRAVENOUS | Status: DC

## 2024-12-24 MED ORDER — LACTATED RINGERS IV BOLUS (SEPSIS)
1000.0000 mL | Freq: Once | INTRAVENOUS | Status: AC
  Administered 2024-12-24: 1000 mL via INTRAVENOUS

## 2024-12-24 MED ORDER — METRONIDAZOLE 500 MG/100ML IV SOLN
500.0000 mg | Freq: Once | INTRAVENOUS | Status: AC
  Administered 2024-12-24: 500 mg via INTRAVENOUS
  Filled 2024-12-24: qty 100

## 2024-12-24 MED ORDER — LORAZEPAM 2 MG/ML IJ SOLN
0.5000 mg | Freq: Once | INTRAMUSCULAR | Status: AC
  Administered 2024-12-25: 0.5 mg via INTRAVENOUS

## 2024-12-24 MED ORDER — ALBUTEROL SULFATE (2.5 MG/3ML) 0.083% IN NEBU
10.0000 mg/h | INHALATION_SOLUTION | Freq: Once | RESPIRATORY_TRACT | Status: AC
  Administered 2024-12-24: 10 mg/h via RESPIRATORY_TRACT
  Filled 2024-12-24: qty 12

## 2024-12-24 MED ORDER — SODIUM CHLORIDE 0.9 % IV SOLN
2.0000 g | Freq: Once | INTRAVENOUS | Status: AC
  Administered 2024-12-24: 2 g via INTRAVENOUS
  Filled 2024-12-24: qty 12.5

## 2024-12-24 MED ORDER — ALBUTEROL SULFATE (2.5 MG/3ML) 0.083% IN NEBU
5.0000 mg | INHALATION_SOLUTION | Freq: Once | RESPIRATORY_TRACT | Status: AC
  Administered 2024-12-24: 5 mg via RESPIRATORY_TRACT
  Filled 2024-12-24: qty 6

## 2024-12-24 MED ORDER — METHYLPREDNISOLONE SODIUM SUCC 125 MG IJ SOLR: 125.0000 mg | Freq: Once | INTRAMUSCULAR | Status: AC

## 2024-12-24 MED ORDER — FENTANYL CITRATE (PF) 50 MCG/ML IJ SOSY
50.0000 ug | PREFILLED_SYRINGE | Freq: Once | INTRAMUSCULAR | Status: AC
  Administered 2024-12-24: 50 ug via INTRAVENOUS
  Filled 2024-12-24: qty 1

## 2024-12-24 MED ORDER — NICOTINE 21 MG/24HR TD PT24
21.0000 mg | MEDICATED_PATCH | Freq: Every day | TRANSDERMAL | Status: DC
  Administered 2024-12-24 – 2024-12-29 (×6): 21 mg via TRANSDERMAL
  Filled 2024-12-24 (×2): qty 1

## 2024-12-24 MED ORDER — OXYCODONE HCL 5 MG PO TABS
5.0000 mg | ORAL_TABLET | ORAL | Status: DC | PRN
  Administered 2024-12-24 – 2024-12-25 (×3): 5 mg via ORAL
  Filled 2024-12-24: qty 1

## 2024-12-24 MED ORDER — VANCOMYCIN HCL IN DEXTROSE 1-5 GM/200ML-% IV SOLN
1000.0000 mg | Freq: Once | INTRAVENOUS | Status: AC
  Administered 2024-12-24: 1000 mg via INTRAVENOUS
  Filled 2024-12-24: qty 200

## 2024-12-24 MED ORDER — LACTATED RINGERS IV BOLUS (SEPSIS)
250.0000 mL | Freq: Once | INTRAVENOUS | Status: AC
  Administered 2024-12-24: 250 mL via INTRAVENOUS

## 2024-12-24 MED ORDER — IPRATROPIUM-ALBUTEROL 0.5-2.5 (3) MG/3ML IN SOLN
3.0000 mL | Freq: Once | RESPIRATORY_TRACT | Status: AC
  Administered 2024-12-24: 3 mL via RESPIRATORY_TRACT
  Filled 2024-12-24: qty 3

## 2024-12-24 MED ORDER — METHYLPREDNISOLONE SODIUM SUCC 125 MG IJ SOLR
INTRAMUSCULAR | Status: AC
  Administered 2024-12-24: 125 mg via INTRAVENOUS
  Filled 2024-12-24: qty 2

## 2024-12-24 MED ORDER — ACETAMINOPHEN 325 MG PO TABS
650.0000 mg | ORAL_TABLET | Freq: Once | ORAL | Status: AC | PRN
  Administered 2024-12-24: 650 mg via ORAL
  Filled 2024-12-24: qty 2

## 2024-12-24 MED ORDER — MAGNESIUM SULFATE 2 GM/50ML IV SOLN: 2.0000 g | Freq: Once | INTRAVENOUS | Status: DC

## 2024-12-24 MED ORDER — OSELTAMIVIR PHOSPHATE 75 MG PO CAPS
75.0000 mg | ORAL_CAPSULE | Freq: Two times a day (BID) | ORAL | Status: AC
  Administered 2024-12-24 – 2024-12-29 (×10): 75 mg via ORAL
  Filled 2024-12-24 (×3): qty 1

## 2024-12-24 NOTE — ED Provider Triage Note (Signed)
 Emergency Medicine Provider Triage Evaluation Note  Deborah Melendez , a 48 y.o. female  was evaluated in triage.  Pt complains of shortness of breath.  Patient is in acute distress, unable to speak due to shortness of breath/coughing.  History of COPD, has been intubated previously for a COPD exacerbation.  Review of Systems  Positive: As above Negative: As above  Physical Exam  BP (!) 137/99 (BP Location: Right Arm)   Pulse (!) 128   Temp 99.8 F (37.7 C)   Resp (!) 25   LMP  (LMP Unknown)   SpO2 90%  Gen:   Awake, in acute distress  Resp:  Tachypneic, diminished air movement, diffuse wheezing MSK:   Moves extremities without difficulty  Other:    Medical Decision Making  Medically screening exam initiated at 1:52 PM.  Appropriate orders placed.  Deborah Melendez was informed that the remainder of the evaluation will be completed by another provider, this initial triage assessment does not replace that evaluation, and the importance of remaining in the ED until their evaluation is complete.  Patient is in distress, DuoNeb treatments/steroids/magnesium  initiated in triage, severe COPD exacerbation with history of intubation   Glendia Rocky SAILOR, PA-C 12/24/24 1353

## 2024-12-24 NOTE — ED Notes (Signed)
"  Pt refused BIPAP  "

## 2024-12-24 NOTE — ED Provider Notes (Signed)
 Care was taken over from Dr. Ginger.  Patient presented with respiratory distress.  She has had prior intubations for COPD.  She was hypoxic and tachypneic on arrival.  She is also febrile.  She was treated for possible sepsis.  Chest x-ray does not show an obvious pneumonia.  She was started on continuous neb and given magnesium  and Solu-Medrol .  She says she is doing a little better but she actually got quite a bit worse after she used a bedpan going to the bathroom.  I ordered BiPAP but she is currently refusing.  Will do another neb.  Will plan admission.  Viral panel for COVID/flu cannot be done here today so ordered the respiratory 20 pathogen panel.  Discussed with Dr. Bryn who will admit the pt.  CRITICAL CARE Performed by: Andrea Ness Total critical care time: 45 minutes Critical care time was exclusive of separately billable procedures and treating other patients. Critical care was necessary to treat or prevent imminent or life-threatening deterioration. Critical care was time spent personally by me on the following activities: development of treatment plan with patient and/or surrogate as well as nursing, discussions with consultants, evaluation of patient's response to treatment, examination of patient, obtaining history from patient or surrogate, ordering and performing treatments and interventions, ordering and review of laboratory studies, ordering and review of radiographic studies, pulse oximetry and re-evaluation of patient's condition.    Ness Andrea, MD 12/24/24 [phone]

## 2024-12-24 NOTE — ED Notes (Signed)
RT at the bedside with pt

## 2024-12-24 NOTE — ED Provider Notes (Signed)
 " [place] EM[facility]GENCY DEPARTMENT AT [facility] Provider Note   CSN: [id number] Arrival date & time: 12/24/24  1328     Patient presents with: Respiratory Distress   Deborah Melendez is a 48 y.o. female.   The history is provided by the patient and medical records. No language interpreter was used.  Shortness of Breath Severity:  Severe Onset quality:  Gradual Duration:  1 week Timing:  Constant Progression:  Worsening Chronicity:  Recurrent Context: URI   Relieved by:  Nothing Worsened by:  Coughing Ineffective treatments:  None tried Associated symptoms: chest pain, cough, fever, sputum production and wheezing   Associated symptoms: no abdominal pain, no diaphoresis, no headaches, no hemoptysis, no neck pain, no rash and no vomiting        Prior to Admission medications  Medication Sig Start Date End Date Taking? Authorizing Provider  albuterol  (PROVENTIL ) (2.5 MG/3ML) 0.083% nebulizer solution Take 3 mLs (2.5 mg total) by nebulization every 6 (six) hours as needed for wheezing or shortness of breath. 10/23/24 11/22/24  Kammerer, Duwaine CROME, DO  albuterol  (VENTOLIN  HFA) 108 (90 Base) MCG/ACT inhaler Inhale 2 puffs into the lungs every 6 (six) hours as needed for wheezing or shortness of breath. 06/18/20   Aslam, Sadia, MD  albuterol  (VENTOLIN  HFA) 108 (90 Base) MCG/ACT inhaler Inhale 1-2 puffs into the lungs every 6 (six) hours as needed for wheezing or shortness of breath. 11/28/24   Jerral Meth, MD  azithromycin  (ZITHROMAX ) 250 MG tablet Take first 2 tablets together, then 1 every day until finished. 11/28/24   Countryman, Chase, MD  bismuth subsalicylate (PEPTO BISMOL) 262 MG/15ML suspension Take 30 mLs by mouth every 6 (six) hours as needed.    [provider]  DULoxetine  (CYMBALTA ) 30 MG capsule Take 1 capsule (30 mg total) by mouth daily. Patient not taking: Reported on 07/11/2020 06/21/20 06/21/21  Lang Dover, MD  ferrous sulfate  325  (65 FE) MG tablet Take 1 tablet (325 mg total) by mouth daily with breakfast. 06/18/20   Selene Henderson, MD  ferumoxytol  (FERAHEME ) 510 MG/17ML SOLN injection Inject 17 mLs (510 mg total) into the vein once for 1 dose. 07/30/20 07/30/20  Zina Jerilynn LABOR, MD  Fluticasone -Salmeterol (ADVAIR) 100-50 MCG/DOSE AEPB Inhale 1 puff into the lungs 2 (two) times daily. 06/18/20   Aslam, Sadia, MD  lisinopril  (ZESTRIL ) 10 MG tablet Take 1 tablet (10 mg total) by mouth daily. 11/28/24   Jerral Meth, MD  megestrol  (MEGACE ) 40 MG tablet Take 1 tablet (40 mg total) by mouth daily. Patient not taking: Reported on 07/11/2020 06/18/20   Aslam, Sadia, MD  megestrol  (MEGACE ) 40 MG tablet Take 1 tablet (40 mg total) by mouth daily. 07/11/20   Zina Jerilynn LABOR, MD  OLANZapine  (ZYPREXA ) 10 MG tablet Take 1 tablet (10 mg total) by mouth at bedtime. Patient not taking: Reported on 07/11/2020 06/21/20 06/21/21  Lang Dover, MD  pantoprazole  (PROTONIX ) 40 MG tablet Take 1 tablet (40 mg total) by mouth daily. Patient not taking: Reported on 07/11/2020 06/18/20   Aslam, Sadia, MD    Allergies: Patient has no known allergies.    Review of Systems  Constitutional:  Positive for chills, fatigue and fever. Negative for diaphoresis.  HENT:  Positive for congestion.   Respiratory:  Positive for cough, sputum production, chest tightness, shortness of breath and wheezing. Negative for hemoptysis and stridor.   Cardiovascular:  Positive for chest pain. Negative for palpitations and leg swelling.  Gastrointestinal:  Negative for  abdominal pain, constipation, diarrhea, nausea and vomiting.  Genitourinary:  Negative for dysuria.  Musculoskeletal:  Negative for back pain, neck pain and neck stiffness.  Skin:  Negative for rash and wound.  Neurological:  Negative for headaches.  Psychiatric/Behavioral:  Negative for agitation and confusion.   All other systems reviewed and are negative.   Updated Vital Signs BP (!) 137/99 (BP  Location: Right Arm)   Pulse (!) 128   Temp 99.8 F (37.7 C)   Resp (!) 25   Ht 5' 4 (1.626 m)   Wt 68 kg   LMP  (LMP Unknown)   SpO2 90%   BMI 25.73 kg/m   Physical Exam Vitals and nursing note reviewed.  Constitutional:      General: She is in acute distress.     Appearance: She is well-developed. She is ill-appearing. She is not toxic-appearing or diaphoretic.  HENT:     Head: Normocephalic and atraumatic.     Nose: Congestion present. No rhinorrhea.     Mouth/Throat:     Mouth: Mucous membranes are moist.     Pharynx: No oropharyngeal exudate or posterior oropharyngeal erythema.  Eyes:     Extraocular Movements: Extraocular movements intact.     Conjunctiva/sclera: Conjunctivae normal.     Pupils: Pupils are equal, round, and reactive to light.  Cardiovascular:     Rate and Rhythm: Regular rhythm. Tachycardia present.     Heart sounds: No murmur heard. Pulmonary:     Effort: Respiratory distress present.     Breath sounds: Wheezing and rhonchi present. No rales.  Chest:     Chest wall: No tenderness.  Abdominal:     General: Abdomen is flat.     Palpations: Abdomen is soft.     Tenderness: There is no abdominal tenderness.  Musculoskeletal:        General: No swelling or tenderness.     Cervical back: Neck supple. No tenderness.  Skin:    General: Skin is warm and dry.     Capillary Refill: Capillary refill takes less than 2 seconds.     Findings: No erythema.  Neurological:     General: No focal deficit present.     Mental Status: She is alert.  Psychiatric:        Mood and Affect: Mood normal.     (all labs ordered are listed, but only abnormal results are displayed) Labs Reviewed  BASIC METABOLIC PANEL WITH GFR - Abnormal; Notable for the following components:      Result Value   Sodium 132 (*)    CO2 19 (*)    Glucose, Bld 103 (*)    All other components within normal limits  CBC - Abnormal; Notable for the following components:   Hemoglobin  16.3 (*)    HCT 48.1 (*)    All other components within normal limits  RESP PANEL BY RT-PCR (RSV, FLU A&B, COVID)  RVPGX2  CULTURE, BLOOD (ROUTINE X 2)  CULTURE, BLOOD (ROUTINE X 2)  HCG, SERUM, QUALITATIVE  PROTIME-INR  HEPATIC FUNCTION PANEL  URINALYSIS, W/ REFLEX TO CULTURE (INFECTION SUSPECTED)  I-STAT CG4 LACTIC ACID, ED  I-STAT CG4 LACTIC ACID, ED    EKG: EKG Interpretation Date/Time:  Sunday December 24 2024 13:49:57 EST Ventricular Rate:  136 PR Interval:  132 QRS Duration:  86 QT Interval:  296 QTC Calculation: 445 R Axis:   91  Text Interpretation: Sinus tachycardia Right atrial enlargement Rightward axis Pulmonary disease pattern Nonspecific ST abnormality Abnormal  ECG When compared with ECG of 28-Nov-2024 03:37, PREVIOUS ECG IS PRESENT when compared to prior, faster rate No STEMI Confirmed by Ginger Barefoot ([id number]) on 12/24/2024 2:00:59 PM  Radiology: DG Chest Portable 1 View Result Date: 12/24/2024 CLINICAL DATA:  Hypoxia with chest tightness, shortness of breath, cough, fever and chills. EXAM: PORTABLE CHEST 1 VIEW COMPARISON:  November 28, 2024 FINDINGS: The heart size and mediastinal contours are within normal limits. Both lungs are clear. The visualized skeletal structures are unremarkable. IMPRESSION: No active disease. Electronically Signed   By: Suzen Dials M.D.   On: 12/24/2024 15:36     Procedures   CRITICAL CARE Performed by: Lonni PARAS [place] Total critical care time: 35 minutes Critical care time was exclusive of separately billable procedures and treating other patients. Critical care was necessary to treat or prevent imminent or life-threatening deterioration. Critical care was time spent personally by me on the following activities: development of treatment plan with patient and/or surrogate as well as nursing, discussions with consultants, evaluation of patient's response to treatment, examination of patient, obtaining history from patient  or surrogate, ordering and performing treatments and interventions, ordering and review of laboratory studies, ordering and review of radiographic studies, pulse oximetry and re-evaluation of patient's condition.  Medications Ordered in the ED  lactated ringers  infusion (has no administration in time range)  lactated ringers  bolus 1,000 mL (1,000 mLs Intravenous New Bag/Given 12/24/24 1504)    And  lactated ringers  bolus 1,000 mL (1,000 mLs Intravenous New Bag/Given 12/24/24 1542)    And  lactated ringers  bolus 250 mL (has no administration in time range)  metroNIDAZOLE  (FLAGYL ) IVPB 500 mg (has no administration in time range)  vancomycin  (VANCOCIN ) IVPB 1000 mg/200 mL premix (1,000 mg Intravenous New Bag/Given 12/24/24 1509)  ipratropium-albuterol  (DUONEB) 0.5-2.5 (3) MG/3ML nebulizer solution 3 mL (3 mLs Nebulization Given 12/24/24 1400)  methylPREDNISolone  sodium succinate (SOLU-MEDROL ) 125 mg/2 mL injection 125 mg (125 mg Intravenous Given 12/24/24 1401)  magnesium  sulfate IVPB 2 g 50 mL (0 g Intravenous Stopped 12/24/24 1508)  albuterol  (PROVENTIL ) (2.5 MG/3ML) 0.083% nebulizer solution (10 mg/hr Nebulization Given 12/24/24 1441)  ceFEPIme  (MAXIPIME ) 2 g in sodium chloride  0.9 % 100 mL IVPB (0 g Intravenous Stopped 12/24/24 1536)                                    Medical Decision Making Amount and/or Complexity of Data Reviewed Labs: ordered. Radiology: ordered.  Risk Prescription drug management.    Deborah Melendez is a 48 y.o. female with a past medical history significant for COPD, asthma with previous exacerbations requiring intubation, and substance abuse who presents with fevers, chills, congestion, productive cough, shortness of breath, and respiratory stress.  Cording to patient, for the last week she is to have URI symptoms that acutely worsened today.  She was found to be in respiratory distress with hypoxia in the 80s in triage.  She had wheezing in all lung  fields in triage and was started on steroids, breathing treatment and magnesium .  On my first evaluation, patient is tachypneic tachycardic and feels very warm.  Her rectal temperature was found to be 102.6.  She is denying nausea, vomiting, constipation, diarrhea, or urinary changes.  Denies any leg pain or leg swelling.  She reports chest tightness but is denying other chest pain.  She denies pleuritic discomfort.  She is just having tightness.  She think she  has infection and pneumonia.  Given her vital signs will activate a code sepsis.  Will start continuous nebulizer.  Will order a portable chest x-ray so she stays in the room and does not go to radiology.  She will get screening labs and workup and will need admission for sepsis with respiratory distress and hypoxia.  At this time do not feel she needs intubation and will try with just a continuous nebulizer.  Patient may require BiPAP.  Anticipate admission after workup is further.   Care transferred oncoming team to wait for workup results and reassessment.  Anticipate discharge for further management.      Final diagnoses:  Hypoxia  Shortness of breath  Wheezing  Sepsis, due to unspecified organism, unspecified whether acute organ dysfunction present [facility])    Clinical Impression: 1. Hypoxia   2. Shortness of breath   3. Wheezing   4. Sepsis, due to unspecified organism, unspecified whether acute organ dysfunction present [facility])     Disposition: Care transferred oncoming team to wait for workup results and reassessment.  Anticipate discharge for further management  This note was prepared with assistance of Dragon voice recognition software. Occasional wrong-word or sound-a-like substitutions may have occurred due to the inherent limitations of voice recognition software.        Dorrie Cocuzza, Lonni PARAS, MD 12/24/24 1543  "

## 2024-12-24 NOTE — H&P (Signed)
 " History and Physical    Patient: Deborah Melendez FMW:[id number] DOB: 23-Jun-1976 DOA: 12/24/2024 DOS: the patient was seen and examined on 12/24/2024 PCP: Delbert Clam, MD  Patient coming from: Home  Chief Complaint:  Chief Complaint  Patient presents with   Respiratory Distress   HPI: Deborah Melendez is a 48 y.o. female with a history of 3L O2-dependent COPD/asthma, substance abuse, and ongoing tobacco use who presented to the ED in respiratory distress. She reports 7 days of progressively worsening severity of cough becoming productive of yellow sputum, nasal discharge, sore throat, and fevers. She has moved from [place], [place] and hasn't established with MD yet, has run out of albuterol  neb solution and doesn't have inhalers, so has only been using nyquil for symptoms with little effect. Her daughter with whom she lives has the same symptoms and onset but is getting better.   She was wheezing worse today, prompting presentation where she was found to be tachypneic, tachycardic, and hypoxemic with fever to 102.18F. CXR was clear, WBC normal, lactic acid serially reassuring. She was given continuous bronchodilator therapy, IV steroids, and sepsis-targeted antibiotics and IV fluids. Given her history of requiring intubation for similar presentation in the past, she was monitored in the ED and seems to have improved enough, also refusing BiPAP, to be stable for progressive level of care at admission.   Describes chest tightness across the front of the chest worse with shortness of breath, has improved currently. Not substernal, no pressure, not particularly exertional. Also has had some loose stools, but no abd pain, N/V. Has eaten very little over past 3 days.   Review of Systems: As mentioned in the history of present illness. All other systems reviewed and are negative. Past Medical History:  Diagnosis Date   Asthma    Bipolar 1 disorder, depressed, severe (HCC)  02/04/2017   Cocaine abuse with cocaine-induced mood disorder (HCC) 07/27/2017   COPD (chronic obstructive pulmonary disease) (HCC)    Depression    Homicidal ideations    Manic behavior (HCC)    MDD (major depressive disorder), recurrent severe, without psychosis (HCC) 08/08/2015   Substance or medication-induced bipolar and related disorder with onset during intoxication (HCC) 09/08/2016   Suicidal ideation    Past Surgical History:  Procedure Laterality Date   FINGER SURGERY     Social History:  reports that she has been smoking cigarettes. She has never used smokeless tobacco. She reports current alcohol use of about 1.0 standard drink of alcohol per week. She reports current drug use. Frequency: 5.00 times per week. Drugs: Cocaine and Marijuana.  Allergies[1]  Family History  Problem Relation Age of Onset   Diabetes Mother    Hypertension Mother    Drug abuse Father    Schizophrenia Maternal Aunt     Prior to Admission medications  Medication Sig Start Date End Date Taking? Authorizing Provider  albuterol  (PROVENTIL ) (2.5 MG/3ML) 0.083% nebulizer solution Take 3 mLs (2.5 mg total) by nebulization every 6 (six) hours as needed for wheezing or shortness of breath. 10/23/24 11/22/24  Kammerer, Duwaine L, DO  albuterol  (VENTOLIN  HFA) 108 (90 Base) MCG/ACT inhaler Inhale 2 puffs into the lungs every 6 (six) hours as needed for wheezing or shortness of breath. 06/18/20   Aslam, Sadia, MD  albuterol  (VENTOLIN  HFA) 108 (90 Base) MCG/ACT inhaler Inhale 1-2 puffs into the lungs every 6 (six) hours as needed for wheezing or shortness of breath. 11/28/24   Jerral Meth, MD  azithromycin  (ZITHROMAX )  250 MG tablet Take first 2 tablets together, then 1 every day until finished. 11/28/24   Countryman, Chase, MD  bismuth subsalicylate (PEPTO BISMOL) 262 MG/15ML suspension Take 30 mLs by mouth every 6 (six) hours as needed.    [provider]  DULoxetine  (CYMBALTA ) 30 MG capsule Take 1  capsule (30 mg total) by mouth daily. Patient not taking: Reported on 07/11/2020 06/21/20 06/21/21  Lang Dover, MD  ferrous sulfate  325 (65 FE) MG tablet Take 1 tablet (325 mg total) by mouth daily with breakfast. 06/18/20   Selene Henderson, MD  ferumoxytol  (FERAHEME ) 510 MG/17ML SOLN injection Inject 17 mLs (510 mg total) into the vein once for 1 dose. 07/30/20 07/30/20  Zina Jerilynn LABOR, MD  Fluticasone -Salmeterol (ADVAIR) 100-50 MCG/DOSE AEPB Inhale 1 puff into the lungs 2 (two) times daily. 06/18/20   Aslam, Sadia, MD  lisinopril  (ZESTRIL ) 10 MG tablet Take 1 tablet (10 mg total) by mouth daily. 11/28/24   Jerral Meth, MD  megestrol  (MEGACE ) 40 MG tablet Take 1 tablet (40 mg total) by mouth daily. Patient not taking: Reported on 07/11/2020 06/18/20   Aslam, Sadia, MD  megestrol  (MEGACE ) 40 MG tablet Take 1 tablet (40 mg total) by mouth daily. 07/11/20   Zina Jerilynn LABOR, MD  OLANZapine  (ZYPREXA ) 10 MG tablet Take 1 tablet (10 mg total) by mouth at bedtime. Patient not taking: Reported on 07/11/2020 06/21/20 06/21/21  Lang Dover, MD  pantoprazole  (PROTONIX ) 40 MG tablet Take 1 tablet (40 mg total) by mouth daily. Patient not taking: Reported on 07/11/2020 06/18/20   Selene Henderson, MD    Physical Exam: Vitals:   12/24/24 1630 12/24/24 1645 12/24/24 1700 12/24/24 1715  BP: (!) 143/98 (!) 136/95 (!) 118/98 129/87  Pulse: (!) 119 (!) 118 (!) 115 (!) 120  Resp: (!) 30 (!) 23 18 (!) 21  Temp:      TempSrc:      SpO2: 100% 100% 100% 100%  Weight:      Height:      Gen: 48yo F in no acute distress, resting. HEENT: Injected erythematous, widely patent oropharynx with mild tonsil enlargement but no exudates. +cobblestoning posteriorly. Pulm: End-expiratory wheezing, good air movement. Tachypneic with slight labor while speaking.   CV: Regular tachycardia, no MRG, no edema GI: Soft, NT, ND, +BS Neuro: Alert and oriented. No new focal deficits. Ext: Warm, no deformities. Skin: No rashes,  lesions or ulcers on visualized skin   Data Reviewed: Lactic acid 1.4 > 1.7 > 1.7 WBC 4.3k (diff pending), hgb 16.3g/dl, plt 792x Na 867, bicarb 19, Cr 0.96.  AST 48, other LFTs wnl.  UA with ketones without glucose, bland micro.   CXR (personal review): Normal aeration without opacities, no effusion, normal cardiac silhouette.   ECG (personal interpretation): Sinus tachycardia with vent rate >130bpm, tall p waves in inferior leads suggestive of RAE, rightward P and QRS axis, persistent and prominent S waves in precordial leads ==> Pulmonary disease pattern. No significant ST deviations.   Assessment and Plan: Acute on chronic hypoxic respiratory failure due to AECOPD due to viral pneumonitis:  - Continue supplemental oxygen  to maintain normal WOB and adequate saturations (likely goal is 89-94% SpO2). Currently not hypoxemic on nasal cannula alone with normal mentation and no elevated WOB. So will allow prn BiPAP,, but has refused and seems to be improving. Monitor closely in PCU.  - Viral panel is in short supply, so is sent out and pending at time of admission. Due to high suspicion  for this (flu in particular given local prevalence currently), will maintain droplet precautions. Symptoms began 7 days ago, so potential benefit is so minimal of empiric antiviral that we will not order at this time.  - Continue IV steroids - Give additional albuterol  right now due to ongoing bronchospasm. Continue duonebs scheduled and prn albuterol .  - Check PCT, but do not currently plan to continue abx with negative XR and no alternative source for sepsis.  - Add diff for supportive evidence of viral infection while swab is pending (?lymphs). Does have nonspecific AST elevation.   Sepsis due to viral infection:  - Pending viral panel and PCT as above.  - s/p vanc/cefepime /flagyl  and 30cc/kg IVF, hemodynamically appears stable at this time.  - Mild polycythemia with hgb 16.3g/dl consistent with ongoing  tobacco use, chronic hypoxemia, but also hemoconcentration.   Chest tightness: Suspect this is noncardiac. Will check troponin x1.    Advance Care Planning: Full code  Consults: None  Family Communication: None  Severity of Illness: The appropriate patient status for this patient is INPATIENT. Inpatient status is judged to be reasonable and necessary in order to provide the required intensity of service to ensure the patient's safety. The patient's presenting symptoms, physical exam findings, and initial radiographic and laboratory data in the context of their chronic comorbidities is felt to place them at high risk for further clinical deterioration. Furthermore, it is not anticipated that the patient will be medically stable for discharge from the hospital within 2 midnights of admission.   * I certify that at the point of admission it is my clinical judgment that the patient will require inpatient hospital care spanning beyond 2 midnights from the point of admission due to high intensity of service, high risk for further deterioration and high frequency of surveillance required.*  Author: Bernardino KATHEE Come, MD 12/24/2024 6:39 PM  For on call review [email or web address].     [1] No Known Allergies  "

## 2024-12-24 NOTE — Progress Notes (Signed)
 Elink following for sepsis protocol.

## 2024-12-24 NOTE — ED Notes (Signed)
 Pt gets very agitated with minimal interaction with her, she is SOB and is refusing to be on biPAP ordered for her. This RN is trying to meet all pt's need from neb treatments, pain meds, food and medication for her flu, pt continues to yell at this RN and getting upset with care. RT called to assess pt and decide best treatment for her.

## 2024-12-24 NOTE — H&P (Signed)
 RT attempted to place patient on BiPAP, pt is refusing, multiple attempts. States she feels claustrophobic.

## 2024-12-24 NOTE — ED Notes (Signed)
 Workup then admit

## 2024-12-24 NOTE — ED Notes (Signed)
"  Xray at bedside.   "

## 2024-12-24 NOTE — ED Triage Notes (Signed)
 Extremely sob with audible wheezing and congested cough. Hx COPD and multiple intubations for this.

## 2024-12-25 ENCOUNTER — Other Ambulatory Visit (HOSPITAL_COMMUNITY): Payer: Self-pay

## 2024-12-25 LAB — BLOOD CULTURE ID PANEL (REFLEXED) - BCID2

## 2024-12-25 LAB — CBC
HCT: 43.4 % (ref 36.0–46.0)
Hemoglobin: 14.3 g/dL (ref 12.0–15.0)
MCH: 32.6 pg (ref 26.0–34.0)
MCHC: 32.9 g/dL (ref 30.0–36.0)
MCV: 98.9 fL (ref 80.0–100.0)
Platelets: 171 K/uL (ref 150–400)
RBC: 4.39 MIL/uL (ref 3.87–5.11)
RDW: 13.7 % (ref 11.5–15.5)
WBC: 6.4 K/uL (ref 4.0–10.5)
nRBC: 0 % (ref 0.0–0.2)

## 2024-12-25 LAB — COMPREHENSIVE METABOLIC PANEL WITH GFR
ALT: 20 U/L (ref 0–44)
AST: 41 U/L (ref 15–41)
Albumin: 3.9 g/dL (ref 3.5–5.0)
Alkaline Phosphatase: 72 U/L (ref 38–126)
Anion gap: 12 (ref 5–15)
BUN: 9 mg/dL (ref 6–20)
CO2: 22 mmol/L (ref 22–32)
Calcium: 8.4 mg/dL — ABNORMAL LOW (ref 8.9–10.3)
Chloride: 105 mmol/L (ref 98–111)
Creatinine, Ser: 0.84 mg/dL (ref 0.44–1.00)
GFR, Estimated: >60 mL/min
Glucose, Bld: 111 mg/dL — ABNORMAL HIGH (ref 70–99)
Potassium: 4.2 mmol/L (ref 3.5–5.1)
Sodium: 138 mmol/L (ref 135–145)
Total Bilirubin: 0.3 mg/dL (ref 0.0–1.2)
Total Protein: 7.1 g/dL (ref 6.5–8.1)

## 2024-12-25 LAB — HIV ANTIBODY (ROUTINE TESTING W REFLEX): HIV Screen 4th Generation wRfx: NONREACTIVE

## 2024-12-25 MED ORDER — DM-GUAIFENESIN ER 30-600 MG PO TB12
1.0000 | ORAL_TABLET | Freq: Two times a day (BID) | ORAL | Status: DC
  Administered 2024-12-25 – 2024-12-29 (×8): 1 via ORAL
  Filled 2024-12-25 (×8): qty 1

## 2024-12-25 MED ORDER — LORAZEPAM 2 MG/ML IJ SOLN: 0.5000 mg | Freq: Four times a day (QID) | INTRAMUSCULAR | Status: DC | PRN

## 2024-12-25 MED ORDER — SODIUM CHLORIDE 0.9% FLUSH
3.0000 mL | Freq: Two times a day (BID) | INTRAVENOUS | Status: DC
  Administered 2024-12-25 – 2024-12-29 (×8): 3 mL via INTRAVENOUS

## 2024-12-25 MED ORDER — ALBUTEROL SULFATE (2.5 MG/3ML) 0.083% IN NEBU
2.5000 mg | INHALATION_SOLUTION | RESPIRATORY_TRACT | Status: DC | PRN
  Administered 2024-12-25 – 2024-12-26 (×4): 2.5 mg via RESPIRATORY_TRACT
  Filled 2024-12-25 (×4): qty 3

## 2024-12-25 MED ORDER — DEXTROMETHORPHAN POLISTIREX ER 30 MG/5ML PO SUER
30.0000 mg | Freq: Two times a day (BID) | ORAL | Status: DC
  Administered 2024-12-25: 30 mg via ORAL
  Filled 2024-12-25 (×2): qty 5

## 2024-12-25 MED ORDER — BENZONATATE 100 MG PO CAPS
100.0000 mg | ORAL_CAPSULE | Freq: Three times a day (TID) | ORAL | Status: DC
  Administered 2024-12-25 (×2): 100 mg via ORAL
  Filled 2024-12-25 (×2): qty 1

## 2024-12-25 MED ORDER — ONDANSETRON HCL 4 MG PO TABS
4.0000 mg | ORAL_TABLET | Freq: Four times a day (QID) | ORAL | Status: DC | PRN
  Administered 2024-12-25: 4 mg via ORAL
  Filled 2024-12-25: qty 1

## 2024-12-25 MED ORDER — ACETAMINOPHEN 325 MG PO TABS
650.0000 mg | ORAL_TABLET | Freq: Four times a day (QID) | ORAL | Status: DC | PRN
  Administered 2024-12-26 – 2024-12-27 (×3): 650 mg via ORAL
  Filled 2024-12-25 (×3): qty 2

## 2024-12-25 MED ORDER — LORAZEPAM 2 MG/ML IJ SOLN
0.5000 mg | Freq: Once | INTRAMUSCULAR | Status: AC
  Administered 2024-12-25: 0.5 mg via INTRAVENOUS
  Filled 2024-12-25: qty 1

## 2024-12-25 MED ORDER — HYDROCODONE BIT-HOMATROP MBR 5-1.5 MG/5ML PO SOLN
5.0000 mL | ORAL | Status: DC | PRN
  Administered 2024-12-25 – 2024-12-26 (×2): 5 mL via ORAL
  Filled 2024-12-25 (×2): qty 5

## 2024-12-25 MED ORDER — HYDROMORPHONE HCL 1 MG/ML IJ SOLN: 0.5000 mg | Freq: Once | INTRAMUSCULAR | Status: DC | PRN

## 2024-12-25 MED ORDER — HYDROCODONE-ACETAMINOPHEN 5-325 MG PO TABS
1.0000 | ORAL_TABLET | Freq: Four times a day (QID) | ORAL | Status: DC | PRN
  Administered 2024-12-25 – 2024-12-28 (×11): 1 via ORAL
  Filled 2024-12-25 (×10): qty 1

## 2024-12-25 MED ORDER — METHYLPREDNISOLONE SODIUM SUCC 125 MG IJ SOLR
80.0000 mg | Freq: Two times a day (BID) | INTRAMUSCULAR | Status: DC
  Administered 2024-12-25 – 2024-12-28 (×6): 80 mg via INTRAVENOUS
  Filled 2024-12-25 (×6): qty 2

## 2024-12-25 MED ORDER — MORPHINE SULFATE (PF) 2 MG/ML IV SOLN: 1.0000 mg | INTRAVENOUS | Status: DC | PRN

## 2024-12-25 MED ORDER — IPRATROPIUM-ALBUTEROL 0.5-2.5 (3) MG/3ML IN SOLN
3.0000 mL | Freq: Four times a day (QID) | RESPIRATORY_TRACT | Status: DC
  Administered 2024-12-25 – 2024-12-29 (×16): 3 mL via RESPIRATORY_TRACT
  Filled 2024-12-25 (×19): qty 3

## 2024-12-25 MED ORDER — ONDANSETRON HCL 4 MG/2ML IJ SOLN
4.0000 mg | Freq: Four times a day (QID) | INTRAMUSCULAR | Status: DC | PRN
  Administered 2024-12-25: 4 mg via INTRAVENOUS
  Filled 2024-12-25: qty 2

## 2024-12-25 MED ORDER — GUAIFENESIN ER 600 MG PO TB12
1200.0000 mg | ORAL_TABLET | Freq: Two times a day (BID) | ORAL | Status: DC
  Administered 2024-12-25 – 2024-12-29 (×9): 1200 mg via ORAL
  Filled 2024-12-25 (×9): qty 2

## 2024-12-25 MED ORDER — ENOXAPARIN SODIUM 40 MG/0.4ML IJ SOSY
40.0000 mg | PREFILLED_SYRINGE | Freq: Every day | INTRAMUSCULAR | Status: DC
  Administered 2024-12-25 – 2024-12-29 (×5): 40 mg via SUBCUTANEOUS
  Filled 2024-12-25 (×5): qty 0.4

## 2024-12-25 MED ORDER — BENZONATATE 100 MG PO CAPS
100.0000 mg | ORAL_CAPSULE | Freq: Three times a day (TID) | ORAL | Status: DC
  Administered 2024-12-25 – 2024-12-29 (×13): 100 mg via ORAL
  Filled 2024-12-25 (×13): qty 1

## 2024-12-25 MED ORDER — ACETAMINOPHEN 650 MG RE SUPP: 650.0000 mg | Freq: Four times a day (QID) | RECTAL | Status: DC | PRN

## 2024-12-25 MED ORDER — METHYLPREDNISOLONE SODIUM SUCC 40 MG IJ SOLR
40.0000 mg | Freq: Two times a day (BID) | INTRAMUSCULAR | Status: DC
  Administered 2024-12-25: 40 mg via INTRAVENOUS
  Filled 2024-12-25: qty 1

## 2024-12-25 MED ORDER — FLUTICASONE FUROATE-VILANTEROL 100-25 MCG/ACT IN AEPB
1.0000 | INHALATION_SPRAY | Freq: Every day | RESPIRATORY_TRACT | Status: DC
  Administered 2024-12-26 – 2024-12-29 (×3): 1 via RESPIRATORY_TRACT
  Filled 2024-12-25 (×2): qty 28

## 2024-12-25 MED ORDER — LORAZEPAM 0.5 MG PO TABS
0.5000 mg | ORAL_TABLET | Freq: Four times a day (QID) | ORAL | Status: DC | PRN
  Administered 2024-12-25 – 2024-12-26 (×2): 0.5 mg via ORAL
  Filled 2024-12-25 (×2): qty 1

## 2024-12-25 MED ORDER — DILTIAZEM HCL 30 MG PO TABS
30.0000 mg | ORAL_TABLET | Freq: Three times a day (TID) | ORAL | Status: DC
  Administered 2024-12-25 – 2024-12-29 (×12): 30 mg via ORAL
  Filled 2024-12-25 (×15): qty 1

## 2024-12-25 NOTE — Progress Notes (Signed)
" °   12/25/24 1600  Vitals  Resp (!) 24  MEWS COLOR  MEWS Score Color Yellow  Oxygen  Therapy  O2 Device HHFNC  MEWS Score  MEWS Temp 0  MEWS Systolic 0  MEWS Pulse 2  MEWS RR 1  MEWS LOC 0  MEWS Score 3   Has been medicated  "

## 2024-12-25 NOTE — Progress Notes (Signed)
 Patient was seen for increased work of breathing.   She is sleeping, using accessory muscles, wakes to voice, is fully-oriented, and able to speak in complete sentences. She is willing to go back on BiPAP but asks that her anxiety be treated. She also wants something for pain that she attributes to her severe cough.   Plan to treat anxiety and pain, resume BiPAP.

## 2024-12-25 NOTE — ED Notes (Signed)
 Walked into room to introduce myself patient was sitting up in the bed , pulling off Bipap mask, I tried to explain to her that she shouldn't be doing that it was helping her breath immediately states I want a new nurse , I told her I was  here to help her and she was going to have to work with us , bed linens were straightened and new warm blankets given patient was medicated for pain and she seem to calm down some, medication ordered of anxiety

## 2024-12-25 NOTE — Progress Notes (Signed)
 [facility] Progress Note Patient: Deborah Melendez FMW:[id number] DOB: 18-Oct-1976  DOA: 12/24/2024 DOS: the patient was seen and examined on 12/25/2024  Brief Hospital Course: Patient with PMH of asthma, chronic respiratory failure on 3 LPM, substance use, active smoker 1 to 1-1/2 pack a day present to the hospital complains of shortness of breath. Symptoms ongoing for 7 days. Workup in the ED positive for influenza as well as RSV. Was on BiPAP overnight. Assessment and Plan: Acute on chronic hypoxic respiratory failure secondary to acute asthma/COPD exacerbation. Viral sepsis In the setting of viral pneumonitis due to influenza and RSV infection. Baseline oxygen  3 L. Met SIRS criteria with fever tachycardia and leukocytosis. Currently on 10 L of oxygen  right now able to come off the BiPAP. Continue with steroids, continue with nebulizer therapy. Started on antibiotic which I will continue. Aggressive cough regimen ordered as well. Will monitor for improvement.  Active smoker. Counseled to quit smoking. Nicotine  patch.  Sinus tachycardia. In response to albuterol  nebulization therapy as well as possible SIRS. Will initiate Cardizem  and monitor.  HTN. On lisinopril  at home. Currently on Cardizem  therefore we will hold lisinopril .  History of substance abuse. UDS have been ordered. Will monitor blood counts. Avoid beta-blocker.  Headache. Supportive care.  Nausea. Aggressively treating cough which is most likely responsible for posttussive nausea.  Monitor for improvement.  Mildly elevated procalcitonin. Given the presence of 2 viral etiology at the same time, less likely bacterial in nature for her exacerbation of COPD and asthma. Will discontinue the biotic and monitor.  Subjective: Reports fever short of breath.  Also reports anxiety as well as pain in chest and abdomen from coughing.  Reports significant cough.  No vomiting but reports nausea with  cough.  No diarrhea no constipation.  Reports headache which is in the frontal region.  No focal deficit.  Physical Exam: Bilateral expiratory wheezing. S1-S2 present Bowel sounds are noted No edema. Oral mucosa moist.  No focal deficit.  Data Reviewed: I have Reviewed nursing notes, Vitals, and Lab results. Since last encounter, pertinent lab results CBC and BMP   . I have ordered test including CBC and BMP  .   Disposition: Status is: Inpatient Remains inpatient appropriate because: Monitor for improvement in respiratory status  enoxaparin  (LOVENOX ) injection 40 mg Start: 12/25/24 1000  Family Communication: No one at bedside Level of care: Progressive   Vitals:   12/25/24 1547 12/25/24 1548 12/25/24 1600 12/25/24 1605  BP:   (!) 121/90 (!) 121/90  Pulse: (!) 116 (!) 118 (!) 115 (!) 113  Resp: (!) 27 (!) 31 (!) 24 (!) 22  Temp:    97.8 F (36.6 C)  TempSrc:    Axillary  SpO2: 100% 100% 100% 98%  Weight:      Height:         Author: Yetta Blanch, MD 12/25/2024 6:53 PM  Please look on [email or web address] to find out who is on call.

## 2024-12-25 NOTE — Progress Notes (Signed)
" ° °  Brief Progress Note   _____________________________________________________________________________________________________________  Patient Name: Deborah Melendez Patient DOB: [birth year] Date: @TODAY @      Data: Reviewed labs, VS, notes.     Action: No action needed at this time.      Response:    _____________________________________________________________________________________________________________  [facility] RN Expeditor Sharolyn JONETTA Batman Please contact us  directly via secure chat (search for [facility]) or by calling us  at [phone] [facility]).  "

## 2024-12-25 NOTE — Progress Notes (Signed)
" °   12/25/24 0035  BiPAP/CPAP/SIPAP  $ Non-Invasive Ventilator  Non-Invasive Vent Initial  $ Face Mask Medium Yes  BiPAP/CPAP/SIPAP Pt Type Adult  BiPAP/CPAP/SIPAP SERVO  Mask Type Full face mask  Mask Size Medium  Respiratory Rate 33 breaths/min  IPAP 10 cmH20  EPAP 5 cmH2O  Pressure Support 5 cmH20  PEEP 5 cmH20  FiO2 (%) 30 %  Flow Rate 0 lpm  Minute Ventilation 20.9  Leak 8  Peak Inspiratory Pressure (PIP) 8  Tidal Volume (Vt) 530  Patient Home Machine No  Patient Home Mask No  Patient Home Tubing No  Auto Titrate No  Device Plugged into RED Power Outlet Yes    "

## 2024-12-25 NOTE — ED Notes (Signed)
 PT removed Bipap complaining of nausea.  Pt has been medicated for nausea.  PT placed back on bipap.  Importance of keeping it on discussed.  Pt asked how long she would be on it and I told her until her lungs get better.  That she either needed Bipap or intubation and if she didn't wear the mask she could die. Lung sounds are terrible.

## 2024-12-25 NOTE — ED Notes (Signed)
 Pt continues to be agitated, Ativan  and breathing treatment given to help breathing improvement, pt continues to refuse to wear the bipap  respiratory at the bedside. Pt oriented by this RN if she continues refusing treatment there is nothing we can do to help her that she needs to make her decision about how she wants to feel at this point, pt oriented we can't have her constantly yelling for help when she is refusing the help we can give.

## 2024-12-25 NOTE — ED Notes (Signed)
 Admitting MD at bedside.

## 2024-12-25 NOTE — ED Notes (Signed)
 Pt refusing to be connected to the heart monitor at this time. Awaiting for pt to relax some to attempt to place her back on monitor.

## 2024-12-25 NOTE — ED Notes (Signed)
 Patient continues to pull herself off the bipap and will not listen to anyone however then c/o not being able to breath. Maurine given to patient , patient  tossed bedpan  in floor , ask patient to please call RN to assist with bedpan .

## 2024-12-25 NOTE — ED Notes (Signed)
 Pt requested to used the restroom. When told she needed to use a bedpan, she became agitated. Pt took off bi-pap again. Pt then began to state the her mouth is dry. RT stopped by and explained the a side effect of bi-pap is dry mouth, but the pt continued to ask for water . RT said that they would be back in 10 minutes to put her back on, at which point the pt asked for a breathing treatment again. Pt screamed I don't care about getting back on bipap. Pt unhappy about not being able to use the toilet.

## 2024-12-25 NOTE — Progress Notes (Signed)
 PHARMACY - PHYSICIAN COMMUNICATION CRITICAL VALUE ALERT - BLOOD CULTURE IDENTIFICATION (BCID)  Deborah Melendez is an 48 y.o. female who presented to [facility] on 12/24/2024 with a chief complaint of shortness of breath.  Assessment:  1 of 4 bottles staph species, likely contaminant.  Name of physician (or Provider) Contacted: Dr. Charlton  Current antibiotics: none  Changes to prescribed antibiotics recommended:  Recommendations accepted by provider - observe off antibiotics  Results for orders placed or performed during the hospital encounter of 12/24/24  Blood Culture ID Panel (Reflexed) (Collected: 12/24/2024  2:45 PM)  Result Value Ref Range   Enterococcus faecalis NOT DETECTED NOT DETECTED   Enterococcus Faecium NOT DETECTED NOT DETECTED   Listeria monocytogenes NOT DETECTED NOT DETECTED   Staphylococcus species DETECTED (A) NOT DETECTED   Staphylococcus aureus (BCID) NOT DETECTED NOT DETECTED   Staphylococcus epidermidis NOT DETECTED NOT DETECTED   Staphylococcus lugdunensis NOT DETECTED NOT DETECTED   Streptococcus species NOT DETECTED NOT DETECTED   Streptococcus agalactiae NOT DETECTED NOT DETECTED   Streptococcus pneumoniae NOT DETECTED NOT DETECTED   Streptococcus pyogenes NOT DETECTED NOT DETECTED   A.calcoaceticus-baumannii NOT DETECTED NOT DETECTED   Bacteroides fragilis NOT DETECTED NOT DETECTED   Enterobacterales NOT DETECTED NOT DETECTED   Enterobacter cloacae complex NOT DETECTED NOT DETECTED   Escherichia coli NOT DETECTED NOT DETECTED   Klebsiella aerogenes NOT DETECTED NOT DETECTED   Klebsiella oxytoca NOT DETECTED NOT DETECTED   Klebsiella pneumoniae NOT DETECTED NOT DETECTED   Proteus species NOT DETECTED NOT DETECTED   Salmonella species NOT DETECTED NOT DETECTED   Serratia marcescens NOT DETECTED NOT DETECTED   Haemophilus influenzae NOT DETECTED NOT DETECTED   Neisseria meningitidis NOT DETECTED NOT DETECTED   Pseudomonas aeruginosa NOT  DETECTED NOT DETECTED   Stenotrophomonas maltophilia NOT DETECTED NOT DETECTED   Candida albicans NOT DETECTED NOT DETECTED   Candida auris NOT DETECTED NOT DETECTED   Candida glabrata NOT DETECTED NOT DETECTED   Candida krusei NOT DETECTED NOT DETECTED   Candida parapsilosis NOT DETECTED NOT DETECTED   Candida tropicalis NOT DETECTED NOT DETECTED   Cryptococcus neoformans/gattii NOT DETECTED NOT DETECTED    Rocky Slade, PharmD, BCPS 12/25/2024  7:45 PM

## 2024-12-25 NOTE — Plan of Care (Signed)
 Patient is progressing towards goals of care.    Problem: Education: Goal: Knowledge of General Education information will improve Description: Including pain rating scale, medication(s)/side effects and non-pharmacologic comfort measures Outcome: Progressing   Problem: Health Behavior/Discharge Planning: Goal: Ability to manage health-related needs will improve Outcome: Progressing   Problem: Clinical Measurements: Goal: Ability to maintain clinical measurements within normal limits will improve Outcome: Progressing Goal: Will remain free from infection Outcome: Progressing Goal: Diagnostic test results will improve Outcome: Progressing Goal: Respiratory complications will improve Outcome: Progressing Goal: Cardiovascular complication will be avoided Outcome: Progressing   Problem: Activity: Goal: Risk for activity intolerance will decrease Outcome: Progressing   Problem: Nutrition: Goal: Adequate nutrition will be maintained Outcome: Progressing   Problem: Coping: Goal: Level of anxiety will decrease Outcome: Progressing   Problem: Elimination: Goal: Will not experience complications related to bowel motility Outcome: Progressing Goal: Will not experience complications related to urinary retention Outcome: Progressing   Problem: Pain Managment: Goal: General experience of comfort will improve and/or be controlled Outcome: Progressing   Problem: Safety: Goal: Ability to remain free from injury will improve Outcome: Progressing   Problem: Skin Integrity: Goal: Risk for impaired skin integrity will decrease Outcome: Progressing   Problem: Education: Goal: Knowledge of disease or condition will improve Outcome: Progressing Goal: Knowledge of the prescribed therapeutic regimen will improve Outcome: Progressing Goal: Individualized Educational Video(s) Outcome: Progressing   Problem: Activity: Goal: Ability to tolerate increased activity will  improve Outcome: Progressing Goal: Will verbalize the importance of balancing activity with adequate rest periods Outcome: Progressing   Problem: Respiratory: Goal: Ability to maintain a clear airway will improve Outcome: Progressing Goal: Levels of oxygenation will improve Outcome: Progressing Goal: Ability to maintain adequate ventilation will improve Outcome: Progressing

## 2024-12-26 ENCOUNTER — Other Ambulatory Visit (HOSPITAL_COMMUNITY): Payer: Self-pay

## 2024-12-26 ENCOUNTER — Inpatient Hospital Stay (HOSPITAL_COMMUNITY): Payer: MEDICAID

## 2024-12-26 DIAGNOSIS — J441 Chronic obstructive pulmonary disease with (acute) exacerbation: Secondary | ICD-10-CM

## 2024-12-26 DIAGNOSIS — F17200 Nicotine dependence, unspecified, uncomplicated: Secondary | ICD-10-CM

## 2024-12-26 DIAGNOSIS — J9621 Acute and chronic respiratory failure with hypoxia: Secondary | ICD-10-CM

## 2024-12-26 DIAGNOSIS — J111 Influenza due to unidentified influenza virus with other respiratory manifestations: Secondary | ICD-10-CM

## 2024-12-26 DIAGNOSIS — B974 Respiratory syncytial virus as the cause of diseases classified elsewhere: Secondary | ICD-10-CM

## 2024-12-26 LAB — CBC WITH DIFFERENTIAL/PLATELET
Abs Immature Granulocytes: 0 K/uL (ref 0.00–0.07)
Basophils Absolute: 0 K/uL (ref 0.0–0.1)
Basophils Relative: 0 %
Eosinophils Absolute: 0 K/uL (ref 0.0–0.5)
Eosinophils Relative: 0 %
HCT: 42.3 % (ref 36.0–46.0)
Hemoglobin: 14.1 g/dL (ref 12.0–15.0)
Immature Granulocytes: 0 %
Lymphocytes Relative: 15 %
Lymphs Abs: 0.5 K/uL — ABNORMAL LOW (ref 0.7–4.0)
MCH: 32.3 pg (ref 26.0–34.0)
MCHC: 33.3 g/dL (ref 30.0–36.0)
MCV: 96.8 fL (ref 80.0–100.0)
Monocytes Absolute: 0.2 K/uL (ref 0.1–1.0)
Monocytes Relative: 7 %
Neutro Abs: 2.5 K/uL (ref 1.7–7.7)
Neutrophils Relative %: 78 %
Platelets: 148 K/uL — ABNORMAL LOW (ref 150–400)
RBC: 4.37 MIL/uL (ref 3.87–5.11)
RDW: 13.7 % (ref 11.5–15.5)
WBC: 3.2 K/uL — ABNORMAL LOW (ref 4.0–10.5)
nRBC: 0 % (ref 0.0–0.2)

## 2024-12-26 LAB — BASIC METABOLIC PANEL WITH GFR
Anion gap: 8 (ref 5–15)
BUN: 11 mg/dL (ref 6–20)
CO2: 29 mmol/L (ref 22–32)
Calcium: 8.7 mg/dL — ABNORMAL LOW (ref 8.9–10.3)
Chloride: 103 mmol/L (ref 98–111)
Creatinine, Ser: 0.72 mg/dL (ref 0.44–1.00)
GFR, Estimated: >60 mL/min
Glucose, Bld: 136 mg/dL — ABNORMAL HIGH (ref 70–99)
Potassium: 4.8 mmol/L (ref 3.5–5.1)
Sodium: 139 mmol/L (ref 135–145)

## 2024-12-26 LAB — RESPIRATORY PANEL BY PCR
Adenovirus: NOT DETECTED
Bordetella Parapertussis: NOT DETECTED
Bordetella pertussis: NOT DETECTED
Chlamydophila pneumoniae: NOT DETECTED
Coronavirus 229E: NOT DETECTED
Coronavirus HKU1: NOT DETECTED
Coronavirus NL63: NOT DETECTED
Coronavirus OC43: NOT DETECTED
Influenza A: NOT DETECTED
Influenza B: DETECTED — AB
Metapneumovirus: NOT DETECTED
Mycoplasma pneumoniae: NOT DETECTED
Parainfluenza Virus 1: NOT DETECTED
Parainfluenza Virus 2: NOT DETECTED
Parainfluenza Virus 3: NOT DETECTED
Parainfluenza Virus 4: NOT DETECTED
Respiratory Syncytial Virus: DETECTED — AB
Rhinovirus / Enterovirus: NOT DETECTED

## 2024-12-26 LAB — PRO BRAIN NATRIURETIC PEPTIDE: Pro Brain Natriuretic Peptide: <50 pg/mL

## 2024-12-26 LAB — MRSA NEXT GEN BY PCR, NASAL: MRSA by PCR Next Gen: NOT DETECTED

## 2024-12-26 LAB — PROCALCITONIN: Procalcitonin: <0.1 ng/mL

## 2024-12-26 LAB — C-REACTIVE PROTEIN: CRP: 1.4 mg/dL — ABNORMAL HIGH

## 2024-12-26 MED ORDER — MENTHOL 3 MG MT LOZG
1.0000 | LOZENGE | OROMUCOSAL | Status: DC | PRN
  Administered 2024-12-27 (×2): 3 mg via ORAL
  Filled 2024-12-26 (×2): qty 9

## 2024-12-26 MED ORDER — FUROSEMIDE 10 MG/ML IJ SOLN: 20.0000 mg | Freq: Once | INTRAMUSCULAR | Status: DC

## 2024-12-26 MED ORDER — HYDROMORPHONE HCL 1 MG/ML IJ SOLN
0.5000 mg | Freq: Once | INTRAMUSCULAR | Status: AC | PRN
  Administered 2024-12-27: 0.5 mg via INTRAVENOUS
  Filled 2024-12-26: qty 0.5

## 2024-12-26 MED ORDER — HYDROXYZINE HCL 10 MG PO TABS
10.0000 mg | ORAL_TABLET | Freq: Three times a day (TID) | ORAL | Status: DC | PRN
  Administered 2024-12-26 – 2024-12-29 (×6): 10 mg via ORAL
  Filled 2024-12-26 (×6): qty 1

## 2024-12-26 MED ORDER — MIDODRINE HCL 5 MG PO TABS: 10.0000 mg | ORAL_TABLET | Freq: Three times a day (TID) | ORAL | Status: DC

## 2024-12-26 MED ORDER — HYDRALAZINE HCL 20 MG/ML IJ SOLN
10.0000 mg | Freq: Four times a day (QID) | INTRAMUSCULAR | Status: DC | PRN
  Administered 2024-12-28: 10 mg via INTRAVENOUS
  Filled 2024-12-26: qty 1

## 2024-12-26 MED ORDER — PHENOL 1.4 % MT LIQD
1.0000 | OROMUCOSAL | Status: DC | PRN
  Administered 2024-12-27: 1 via OROMUCOSAL
  Filled 2024-12-26: qty 177

## 2024-12-26 MED ORDER — FUROSEMIDE 10 MG/ML IJ SOLN: 40.0000 mg | Freq: Once | INTRAMUSCULAR | Status: DC

## 2024-12-26 MED ORDER — POLYETHYLENE GLYCOL 3350 17 G PO PACK
17.0000 g | PACK | Freq: Two times a day (BID) | ORAL | Status: DC
  Administered 2024-12-26 – 2024-12-27 (×4): 17 g via ORAL
  Filled 2024-12-26 (×5): qty 1

## 2024-12-26 NOTE — Progress Notes (Signed)
" °   12/26/24 0700  Vitals  BP (!) 136/106  MAP (mmHg) 117  Pulse Rate (!) 104  ECG Heart Rate (!) 101  Resp (!) 36  MEWS COLOR  MEWS Score Color Red  Oxygen  Therapy  SpO2 99 %  ECG Monitoring  PR interval 0.14  QRS interval 0.11  QT interval 0.35  QTc interval 0.45  CV Strip Heart Rate 100  Cardiac Rhythm NSR  MEWS Score  MEWS Temp 0  MEWS Systolic 0  MEWS Pulse 1  MEWS RR 3  MEWS LOC 0  MEWS Score 4   Medication available  "

## 2024-12-26 NOTE — Progress Notes (Signed)
" °   12/25/24 1954  Assess: MEWS Score  Temp 98.3 F (36.8 C)  BP (!) 129/97  MAP (mmHg) 107  Pulse Rate (!) 106  ECG Heart Rate (!) 106  Resp (!) 37  SpO2 97 %  O2 Device HHFNC  Assess: MEWS Score  MEWS Temp 0  MEWS Systolic 0  MEWS Pulse 1  MEWS RR 3  MEWS LOC 0  MEWS Score 4  MEWS Score Color Red  Assess: if the MEWS score is Yellow or Red  Were vital signs accurate and taken at a resting state? Yes  Does the patient meet 2 or more of the SIRS criteria? Yes  Does the patient have a confirmed or suspected source of infection? Yes  MEWS guidelines implemented  Yes, red  Treat  MEWS Interventions Considered administering scheduled or prn medications/treatments as ordered  Take Vital Signs  Increase Vital Sign Frequency  Red: Q1hr x2, continue Q4hrs until patient remains green for 12hrs  Escalate  MEWS: Escalate Red: Discuss with charge nurse and notify provider. Consider notifying RRT. If remains red for 2 hours consider need for higher level of care  Notify: Charge Nurse/RN  Name of Charge Nurse/RN Notified Dawn RN  Provider Notification  Provider Name/Title Dr. Charlton  Date Provider Notified 12/25/24  Time Provider Notified 2005  Method of Notification Page (secured chat)  Notification Reason Change in status  Provider response See new orders  Date of Provider Response 12/25/24  Time of Provider Response 2010  Notify: Rapid Response  Name of Rapid Response RN Notified Dave RN  Date Rapid Response Notified 12/25/24  Time Rapid Response Notified 2004  Assess: SIRS CRITERIA  SIRS Temperature  0  SIRS Respirations  1  SIRS Pulse 1  SIRS WBC 0  SIRS Score Sum  2    "

## 2024-12-26 NOTE — Progress Notes (Addendum)
 CM attempted to complete TOC initial assessment with patient. She told CM she is on a lot of medications for breathing and wants to sleep right now. CM will attempt to come back at a later time.

## 2024-12-26 NOTE — Consult Note (Signed)
" ° °  NAME:  Deborah Melendez, MRN:  [id number], DOB:  1976/03/19, LOS: 2 ADMISSION DATE:  12/24/2024, CONSULTATION DATE: 12/26/2024 REFERRING MD: Dr. Dennise, CHIEF COMPLAINT: Hypoxemic respiratory failure  History of Present Illness:  48 year old lady with a history of asthma/COPD, chronic respiratory failure on oxygen  supplementation who came in with worsening shortness of breath, found to have exacerbation of chronic respiratory failure with viral respiratory infection positive for RSV and influenza B Now on high flow nasal cannula Ongoing tobacco use, history of substance abuse.  Usually on 3 L of oxygen  at home Had about 1 week of worsening shortness of breath, productive cough of yellow sputum, sore throat, fevers She has also run out of her usual inhalers.  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 rate (!) 31, height 5' 4 (1.626 m), weight 68 kg, SpO2 99%.    FiO2 (%):  [35 %-62 %] 62 %   Intake/Output Summary (Last 24 hours) at 12/26/2024 1327 Last  data filed at 12/26/2024 0930 Gross per 24 hour  Intake 500 ml  Output --  Net 500 ml   Filed Weights   12/24/24 1357  Weight: 68 kg    Examination: General: Middle-age, does not appear to be in distress HENT: Moist oral mucosa Lungs: Rhonchi bilaterally Cardiovascular: S1-S2 appreciated Abdomen: Soft, bowel sounds appreciated Extremities: No clubbing, no edema Neuro: Awake alert oriented x 3 GU:   Lab data reviewed Chest x-ray reviewed by myself showing no acute infiltrate Microbiology shows positive RSV and influenza B  Resolved problem list   Assessment and Plan   Acute on chronic respiratory failure Acute exacerbation of chronic obstructive pulm disease Influenza B and RSV infection - Continue Tamiflu  X-ray is clear, no leukocytosis, has not had any fever in the last day - Will continue to manage without antibiotics -She did receive cefepime  and metronidazole  and vancomycin  initially  Continue oxygen  supplementation try to maintain saturations greater than 89%-with heated high flow Continue bronchodilator treatments Continue steroids - She likely will still require a significant amount of oxygen  supplementation in the short-term - Ongoing steroids should help with global inflammation  Active smoker Definitely needs to quit -Counseled about this  History of hypertension - Continue Cardizem   She reports feeling little bit better and I expect this to continue but it will take a little bit of time for oxygen  requirement to improve, bronchospasm may also take a little bit of time to improve   Jennet Epley, MD [place] PCCM Pager: See Amion  "

## 2024-12-26 NOTE — Progress Notes (Signed)
 "                                                                                                                                                                                                                                                                                PROGRESS NOTE     Patient Demographics:    Deborah Melendez, is a 48 y.o. female, DOB - 1976/10/31, FMW:[id number]  Outpatient Primary MD for the patient is Newlin, Enobong, MD    LOS - 2  Admit date - 12/24/2024    Chief Complaint  Patient presents with   Respiratory Distress       Brief Narrative (HPI from H&P)    48 y.o. female with a history of 3L O2-dependent COPD/asthma, substance abuse, and ongoing tobacco use who presented to the ED in respiratory distress. She reports 7 days of progressively worsening severity of cough becoming productive of yellow sputum, nasal discharge, sore throat, and fevers. She has moved from [place], [place] and hasn't established with MD yet, has run out of albuterol  neb solution and doesn't have inhalers, so has only been using nyquil for symptoms with little effect.  She presented to [facility] where she was diagnosed with acute on chronic hypoxic respiratory failure requiring BiPAP/heated high flow oxygen , she was diagnosed with influenza B infection along with respiratory synctial virus infection, she was transferred to [facility] for further care.   Subjective:    Deborah Melendez today has, No headache, No chest pain, No abdominal pain - No Nausea, No new weakness tingling or numbness, +ve SOB   Assessment  & Plan :    Acute on chronic hypoxic respiratory failure due to AECOPD due to viral pneumonitis - influenza B infection along with respiratory synctial virus infection :   She has underlying COPD/asthma, uses 3 L nasal cannula oxygen  at home, still smoking for which she has been counseled to abstain, now has significant hypoxia requiring 15 L of heated high flow, diffuse  wheezing, continue steroids, continue Tamiflu , continue nebulizer treatments, requested to sit in chair use I-S and flutter valve for pulmonary toiletry, PCCM to follow.  Still has significant hypoxia.  SpO2: 100 %  O2 Flow Rate (L/min): 50 L/min FiO2 (%): 62 %  Sepsis due to viral infection: No leukocytosis, stable procalcitonin and CRP.  1 out of 2 blood cultures suggesting contamination with coag negative staph.  Continue to monitor with supportive care.  Ongoing smoker.  Counseled to quit.  NicoDerm patch.    Chest tightness: Suspect this is noncardiac.  Resolved.  Essential hypertension.  For now continue home Cardizem  along with as needed hydralazine .      Condition - Extremely Guarded  Family Communication  : None present  Code Status : Full code  Consults  : PCCM  PUD Prophylaxis :     Procedures  :            Disposition Plan  :    Status is: Inpatient   DVT Prophylaxis  :    Place TED hose Start: 12/26/24 1037 enoxaparin  (LOVENOX ) injection 40 mg Start: 12/25/24 1000    Lab Results  Component Value Date   PLT 148 (L) 12/26/2024    Diet :  Diet Order             Diet regular Room service appropriate? Yes; Fluid consistency: Thin  Diet effective now                    Inpatient Medications  Scheduled Meds:  benzonatate   100 mg Oral TID   dextromethorphan -guaiFENesin   1 tablet Oral BID   diltiazem   30 mg Oral Q8H   enoxaparin  (LOVENOX ) injection  40 mg Subcutaneous Daily   fluticasone  furoate-vilanterol  1 puff Inhalation Daily   guaiFENesin   1,200 mg Oral BID   ipratropium-albuterol   3 mL Nebulization Q6H   methylPREDNISolone  (SOLU-MEDROL ) injection  80 mg Intravenous Q12H   nicotine   21 mg Transdermal Daily   oseltamivir   75 mg Oral BID   sodium chloride  flush  3 mL Intravenous Q12H   Continuous Infusions: PRN Meds:.acetaminophen  **OR** acetaminophen , albuterol , hydrALAZINE , HYDROcodone -acetaminophen , ondansetron  **OR** ondansetron   (ZOFRAN ) IV  Antibiotics  :    Anti-infectives (From admission, onward)    Start     Dose/Rate Route Frequency Ordered Stop   12/24/24 2245  oseltamivir  (TAMIFLU ) capsule 75 mg        75 mg Oral 2 times daily 12/24/24 2234 12/29/24 2159   12/24/24 1430  ceFEPIme  (MAXIPIME ) 2 g in sodium chloride  0.9 % 100 mL IVPB        2 g 200 mL/hr over 30 Minutes Intravenous  Once 12/24/24 1426 12/24/24 1536   12/24/24 1430  metroNIDAZOLE  (FLAGYL ) IVPB 500 mg        500 mg 100 mL/hr over 60 Minutes Intravenous  Once 12/24/24 1426 12/24/24 1643   12/24/24 1430  vancomycin  (VANCOCIN ) IVPB 1000 mg/200 mL premix        1,000 mg 200 mL/hr over 60 Minutes Intravenous  Once 12/24/24 1426 12/24/24 1609         Objective:   Vitals:   12/26/24 0600 12/26/24 0700 12/26/24 0806 12/26/24 0807  BP: 108/76 (!) 136/106    Pulse: 95 (!) 104 98 (!) 103  Resp: (!) 34 (!) 36 (!) 37 (!) 24  Temp:      TempSrc:      SpO2: 100% 99% 100% 100%  Weight:      Height:        Wt Readings from Last 3 Encounters:  12/24/24 68 kg  11/28/24 68 kg  10/23/24 68 kg     Intake/Output Summary (Last  24 hours) at 12/26/2024 1044 Last data filed at 12/25/2024 1600 Gross per 24 hour  Intake 150 ml  Output --  Net 150 ml     Physical Exam  Awake Alert, No new F.N deficits, Normal affect [place].AT,PERRAL Supple Neck, No JVD,   Symmetrical Chest wall movement, Good air movement bilaterally, CTAB RRR,No Gallops,Rubs or new Murmurs,  +ve B.Sounds, Abd Soft, No tenderness,   No Cyanosis, Clubbing or edema         Data Review:    Recent Labs  Lab 12/24/24 1351 12/24/24 2001 12/25/24 0828 12/26/24 0610  WBC 4.3 4.4 6.4 3.2*  HGB 16.3* 13.8 14.3 14.1  HCT 48.1* 41.3 43.4 42.3  PLT 207 160 171 148*  MCV 94.7 96.0 98.9 96.8  MCH 32.1 32.1 32.6 32.3  MCHC 33.9 33.4 32.9 33.3  RDW 13.4 13.6 13.7 13.7  LYMPHSABS  --  0.3*  0.3*  --  0.5*  MONOABS  --  0.1  0.1  --  0.2  EOSABS  --  0.0  0.0  --  0.0   BASOSABS  --  0.0  0.0  --  0.0    Recent Labs  Lab 12/24/24 1351 12/24/24 1426 12/24/24 1502 12/24/24 1528 12/24/24 1737 12/24/24 2001 12/25/24 0828 12/26/24 0610  NA 132*  --   --   --   --   --  138 139  K 4.0  --   --   --   --   --  4.2 4.8  CL 98  --   --   --   --   --  105 103  CO2 19*  --   --   --   --   --  22 29  ANIONGAP 15  --   --   --   --   --  12 8  GLUCOSE 103*  --   --   --   --   --  111* 136*  BUN 10  --   --   --   --   --  9 11  CREATININE 0.96  --   --   --   --   --  0.84 0.72  AST  --  48*  --   --   --   --  41  --   ALT  --  27  --   --   --   --  20  --   ALKPHOS  --  90  --   --   --   --  72  --   BILITOT  --  0.5  --   --   --   --  0.3  --   ALBUMIN   --  4.4  --   --   --   --  3.9  --   CRP  --   --   --   --   --   --   --  1.4*  PROCALCITON  --   --   --   --   --  0.14  --  <0.10  LATICACIDVEN  --   --  1.4 1.7 1.7  --   --   --   INR  --  1.0  --   --   --   --   --   --   CALCIUM 9.2  --   --   --   --   --  8.4*  8.7*      Recent Labs  Lab 12/24/24 1351 12/24/24 1426 12/24/24 1502 12/24/24 1528 12/24/24 1737 12/24/24 2001 12/25/24 0828 12/26/24 0610  CRP  --   --   --   --   --   --   --  1.4*  PROCALCITON  --   --   --   --   --  0.14  --  <0.10  LATICACIDVEN  --   --  1.4 1.7 1.7  --   --   --   INR  --  1.0  --   --   --   --   --   --   CALCIUM 9.2  --   --   --   --   --  8.4* 8.7*    --------------------------------------------------------------------------------------------------------------- Lab Results  Component Value Date   CHOL 148 08/25/2018   HDL 52 08/25/2018   LDLCALC 83 08/25/2018   TRIG 99 10/21/2018   CHOLHDL 2.8 08/25/2018    Lab Results  Component Value Date   HGBA1C 5.3 08/25/2018   No results for input(s): TSH, T4TOTAL, FREET4, T3FREE, THYROIDAB in the last 72 hours. No results for input(s): VITAMINB12, FOLATE, FERRITIN, TIBC, IRON, RETICCTPCT in the last 72  hours. ------------------------------------------------------------------------------------------------------------------ Cardiac Enzymes No results for input(s): CKMB, TROPONINI, MYOGLOBIN in the last 168 hours.  Invalid input(s): CK  Micro Results Recent Results (from the past 240 hours)  Resp panel by RT-PCR (RSV, Flu A&B, Covid) Anterior Nasal Swab     Status: Abnormal   Collection Time: 12/24/24  2:17 PM   Specimen: Anterior Nasal Swab  Result Value Ref Range Status   SARS Coronavirus 2 by RT PCR NEGATIVE NEGATIVE Final   Influenza A by PCR NEGATIVE NEGATIVE Final   Influenza B by PCR POSITIVE (A) NEGATIVE Final    Comment: (NOTE) The Xpert Xpress SARS-CoV-2/FLU/RSV plus assay is intended as an aid in the diagnosis of influenza from Nasopharyngeal swab specimens and should not be used as a sole basis for treatment. Nasal washings and aspirates are unacceptable for Xpert Xpress SARS-CoV-2/FLU/RSV testing.  Fact Sheet for Patients: bloggercourse.com  Fact Sheet for Healthcare Providers: seriousbroker.it  This test is not yet approved or cleared by the [place]  FDA and has been authorized for detection and/or diagnosis of SARS-CoV-2 by FDA under an Emergency Use Authorization (EUA). This EUA will remain in effect (meaning this test can be used) for the duration of the COVID-19 declaration under Section 564(b)(1) of the Act, 21 U.S.C. section 360bbb-3(b)(1), unless the authorization is terminated or revoked.     Resp Syncytial Virus by PCR POSITIVE (A) NEGATIVE Final    Comment: (NOTE) Fact Sheet for Patients: bloggercourse.com  Fact Sheet for Healthcare Providers: seriousbroker.it  This test is not yet approved or cleared by the [place]  FDA and has been authorized for detection and/or diagnosis of SARS-CoV-2 by FDA under an Emergency Use Authorization  (EUA). This EUA will remain in effect (meaning this test can be used) for the duration of the COVID-19 declaration under Section 564(b)(1) of the Act, 21 U.S.C. section 360bbb-3(b)(1), unless the authorization is terminated or revoked.  Performed at [facility], [address] [place], [place] [address]   Respiratory (~20 pathogens) panel by PCR     Status: Abnormal   Collection Time: 12/24/24  2:17 PM   Specimen: Nasopharyngeal Swab; Respiratory  Result Value Ref Range Status   Adenovirus NOT DETECTED NOT DETECTED Final   Coronavirus  229E NOT DETECTED NOT DETECTED Final    Comment: (NOTE) The Coronavirus on the Respiratory Panel, DOES NOT test for the novel  Coronavirus (2019 nCoV)    Coronavirus HKU1 NOT DETECTED NOT DETECTED Final   Coronavirus NL63 NOT DETECTED NOT DETECTED Final   Coronavirus OC43 NOT DETECTED NOT DETECTED Final   Metapneumovirus NOT DETECTED NOT DETECTED Final   Rhinovirus / Enterovirus NOT DETECTED NOT DETECTED Final   Influenza A NOT DETECTED NOT DETECTED Final   Influenza B DETECTED (A) NOT DETECTED Final   Parainfluenza Virus 1 NOT DETECTED NOT DETECTED Final   Parainfluenza Virus 2 NOT DETECTED NOT DETECTED Final   Parainfluenza Virus 3 NOT DETECTED NOT DETECTED Final   Parainfluenza Virus 4 NOT DETECTED NOT DETECTED Final   Respiratory Syncytial Virus DETECTED (A) NOT DETECTED Final   Bordetella pertussis NOT DETECTED NOT DETECTED Final   Bordetella Parapertussis NOT DETECTED NOT DETECTED Final   Chlamydophila pneumoniae NOT DETECTED NOT DETECTED Final   Mycoplasma pneumoniae NOT DETECTED NOT DETECTED Final    Comment: Performed at [facility], [address], [place], [place] [address]  Blood Culture (routine x 2)     Status: None (Preliminary result)   Collection Time: 12/24/24  2:35 PM   Specimen: BLOOD  Result Value Ref Range Status   Specimen Description BLOOD BLOOD RIGHT FOREARM  Final   Special Requests   Final    BOTTLES  DRAWN AEROBIC AND ANAEROBIC Blood Culture results may not be optimal due to an inadequate volume of blood received in culture bottles   Culture   Final    NO GROWTH 2 DAYS Performed at [facility], [address] [place], [place] [address]    Report Status PENDING  Incomplete  Blood Culture (routine x 2)     Status: None (Preliminary result)   Collection Time: 12/24/24  2:45 PM   Specimen: BLOOD RIGHT HAND  Result Value Ref Range Status   Specimen Description BLOOD RIGHT HAND  Final   Special Requests   Final    BOTTLES DRAWN AEROBIC AND ANAEROBIC Blood Culture results may not be optimal due to an inadequate volume of blood received in culture bottles   Culture  Setup Time   Final    GRAM POSITIVE COCCI IN CLUSTERS AEROBIC BOTTLE ONLY CRITICAL RESULT CALLED TO, READ BACK BY AND VERIFIED WITH: PHARMD ERIN W. [id number] AT 1941, ADC    Culture   Final    GRAM POSITIVE COCCI CULTURE REINCUBATED FOR BETTER GROWTH Performed at [facility], [address], [place], [place] [address]    Report Status PENDING  Incomplete  Blood Culture ID Panel (Reflexed)     Status: Abnormal   Collection Time: 12/24/24  2:45 PM  Result Value Ref Range Status   Enterococcus faecalis NOT DETECTED NOT DETECTED Final   Enterococcus Faecium NOT DETECTED NOT DETECTED Final   Listeria monocytogenes NOT DETECTED NOT DETECTED Final   Staphylococcus species DETECTED (A) NOT DETECTED Final    Comment: CRITICAL RESULT CALLED TO, READ BACK BY AND VERIFIED WITH: PHARMD ERIN W. [id number] AT 1941, ADC    Staphylococcus aureus (BCID) NOT DETECTED NOT DETECTED Final   Staphylococcus epidermidis NOT DETECTED NOT DETECTED Final   Staphylococcus lugdunensis NOT DETECTED NOT DETECTED Final   Streptococcus species NOT DETECTED NOT DETECTED Final   Streptococcus agalactiae NOT DETECTED NOT DETECTED Final   Streptococcus pneumoniae NOT DETECTED NOT DETECTED Final   Streptococcus pyogenes NOT  DETECTED NOT DETECTED  Final   A.calcoaceticus-baumannii NOT DETECTED NOT DETECTED Final   Bacteroides fragilis NOT DETECTED NOT DETECTED Final   Enterobacterales NOT DETECTED NOT DETECTED Final   Enterobacter cloacae complex NOT DETECTED NOT DETECTED Final   Escherichia coli NOT DETECTED NOT DETECTED Final   Klebsiella aerogenes NOT DETECTED NOT DETECTED Final   Klebsiella oxytoca NOT DETECTED NOT DETECTED Final   Klebsiella pneumoniae NOT DETECTED NOT DETECTED Final   Proteus species NOT DETECTED NOT DETECTED Final   Salmonella species NOT DETECTED NOT DETECTED Final   Serratia marcescens NOT DETECTED NOT DETECTED Final   Haemophilus influenzae NOT DETECTED NOT DETECTED Final   Neisseria meningitidis NOT DETECTED NOT DETECTED Final   Pseudomonas aeruginosa NOT DETECTED NOT DETECTED Final   Stenotrophomonas maltophilia NOT DETECTED NOT DETECTED Final   Candida albicans NOT DETECTED NOT DETECTED Final   Candida auris NOT DETECTED NOT DETECTED Final   Candida glabrata NOT DETECTED NOT DETECTED Final   Candida krusei NOT DETECTED NOT DETECTED Final   Candida parapsilosis NOT DETECTED NOT DETECTED Final   Candida tropicalis NOT DETECTED NOT DETECTED Final   Cryptococcus neoformans/gattii NOT DETECTED NOT DETECTED Final    Comment: Performed at [facility], [address] [place], [place] [address]  MRSA Next Gen by PCR, Nasal     Status: None   Collection Time: 12/26/24  5:51 AM   Specimen: Nasal Mucosa; Nasal Swab  Result Value Ref Range Status   MRSA by PCR Next Gen NOT DETECTED NOT DETECTED Final    Comment: (NOTE) The GeneXpert MRSA Assay (FDA approved for NASAL specimens only), is one component of a comprehensive MRSA colonization surveillance program. It is not intended to diagnose MRSA infection nor to guide or monitor treatment for MRSA infections. Test performance is not FDA approved in patients less than 2 years old. Performed at [facility], [address], [place],  [place] [address]     Radiology Report DG Chest Port 1 View Result Date: 12/26/2024 EXAM: 1 VIEW(S) XRAY OF THE CHEST 12/26/2024 07:00:00 AM COMPARISON: 12/24/2024 CLINICAL HISTORY: SOB (shortness of breath) FINDINGS: LUNGS AND PLEURA: No focal pulmonary opacity. No pleural effusion. No pneumothorax. HEART AND MEDIASTINUM: No acute abnormality of the cardiac and mediastinal silhouettes. BONES AND SOFT TISSUES: No acute osseous abnormality. IMPRESSION: 1. No acute process. Electronically signed by: Waddell Calk MD 12/26/2024 07:07 AM EST RP Workstation: [id number]   DG Chest Portable 1 View Result Date: 12/24/2024 CLINICAL DATA:  Hypoxia with chest tightness, shortness of breath, cough, fever and chills. EXAM: PORTABLE CHEST 1 VIEW COMPARISON:  November 28, 2024 FINDINGS: The heart size and mediastinal contours are within normal limits. Both lungs are clear. The visualized skeletal structures are unremarkable. IMPRESSION: No active disease. Electronically Signed   By: Suzen Dials M.D.   On: 12/24/2024 15:36     Signature  -   Lavada Stank M.D on 12/26/2024 at 10:44 AM   -  To page go to [email or web address]   "

## 2024-12-26 NOTE — Progress Notes (Addendum)
 Upon RT assessment patient experiencing slight tachypnea and increased work of breathing on HHFNC. RT administered breathing tx and educated patient on importance of BIPAP use. Patient refused at this time despite her work of breathing and expressed she may be willing to try it later if medication for anxiety was given and if the BIPAP could be removed upon her request.   Vitals are stable at this time with oxygen  saturations steady at 99% despite increased work of breathing. Will attempt BIPAP at a later time with patient, will notify RN.

## 2024-12-26 NOTE — Plan of Care (Signed)

## 2024-12-26 NOTE — Plan of Care (Signed)
 Patient is progressing towards goals of care.    Problem: Education: Goal: Knowledge of General Education information will improve Description: Including pain rating scale, medication(s)/side effects and non-pharmacologic comfort measures Outcome: Progressing   Problem: Health Behavior/Discharge Planning: Goal: Ability to manage health-related needs will improve Outcome: Progressing   Problem: Clinical Measurements: Goal: Ability to maintain clinical measurements within normal limits will improve Outcome: Progressing Goal: Will remain free from infection Outcome: Progressing Goal: Diagnostic test results will improve Outcome: Progressing Goal: Respiratory complications will improve Outcome: Progressing Goal: Cardiovascular complication will be avoided Outcome: Progressing   Problem: Activity: Goal: Risk for activity intolerance will decrease Outcome: Progressing   Problem: Nutrition: Goal: Adequate nutrition will be maintained Outcome: Progressing   Problem: Coping: Goal: Level of anxiety will decrease Outcome: Progressing   Problem: Elimination: Goal: Will not experience complications related to bowel motility Outcome: Progressing Goal: Will not experience complications related to urinary retention Outcome: Progressing   Problem: Pain Managment: Goal: General experience of comfort will improve and/or be controlled Outcome: Progressing   Problem: Safety: Goal: Ability to remain free from injury will improve Outcome: Progressing   Problem: Skin Integrity: Goal: Risk for impaired skin integrity will decrease Outcome: Progressing   Problem: Education: Goal: Knowledge of disease or condition will improve Outcome: Progressing Goal: Knowledge of the prescribed therapeutic regimen will improve Outcome: Progressing Goal: Individualized Educational Video(s) Outcome: Progressing   Problem: Activity: Goal: Ability to tolerate increased activity will  improve Outcome: Progressing Goal: Will verbalize the importance of balancing activity with adequate rest periods Outcome: Progressing   Problem: Respiratory: Goal: Ability to maintain a clear airway will improve Outcome: Progressing Goal: Levels of oxygenation will improve Outcome: Progressing Goal: Ability to maintain adequate ventilation will improve Outcome: Progressing

## 2024-12-26 NOTE — Progress Notes (Signed)
 Patient is currently refusing TED hose- will continue to provide education and attempt to encourage use

## 2024-12-26 NOTE — Progress Notes (Signed)
" °   12/26/24 0735  Vitals  Pulse Rate (!) 107  ECG Heart Rate (!) 113  Resp (!) 37  MEWS COLOR  MEWS Score Color Red  Oxygen  Therapy  SpO2 100 %  MEWS Score  MEWS Temp 0  MEWS Systolic 0  MEWS Pulse 2  MEWS RR 3  MEWS LOC 0  MEWS Score 5   Medication to be administered  "

## 2024-12-27 LAB — CBC WITH DIFFERENTIAL/PLATELET
Abs Immature Granulocytes: 0.01 K/uL (ref 0.00–0.07)
Basophils Absolute: 0 K/uL (ref 0.0–0.1)
Basophils Relative: 0 %
Eosinophils Absolute: 0 K/uL (ref 0.0–0.5)
Eosinophils Relative: 0 %
HCT: 41.4 % (ref 36.0–46.0)
Hemoglobin: 13.4 g/dL (ref 12.0–15.0)
Immature Granulocytes: 0 %
Lymphocytes Relative: 14 %
Lymphs Abs: 0.8 K/uL (ref 0.7–4.0)
MCH: 31.8 pg (ref 26.0–34.0)
MCHC: 32.4 g/dL (ref 30.0–36.0)
MCV: 98.1 fL (ref 80.0–100.0)
Monocytes Absolute: 0.3 K/uL (ref 0.1–1.0)
Monocytes Relative: 6 %
Neutro Abs: 4.5 K/uL (ref 1.7–7.7)
Neutrophils Relative %: 80 %
Platelets: 151 K/uL (ref 150–400)
RBC: 4.22 MIL/uL (ref 3.87–5.11)
RDW: 13.7 % (ref 11.5–15.5)
Smear Review: NORMAL
WBC: 5.6 K/uL (ref 4.0–10.5)
nRBC: 0 % (ref 0.0–0.2)

## 2024-12-27 LAB — URINE DRUG SCREEN
Amphetamines: NEGATIVE
Barbiturates: NEGATIVE
Benzodiazepines: POSITIVE — AB
Cocaine: POSITIVE — AB
Fentanyl: NEGATIVE
Methadone Scn, Ur: NEGATIVE
Opiates: POSITIVE — AB
Tetrahydrocannabinol: POSITIVE — AB

## 2024-12-27 LAB — BASIC METABOLIC PANEL WITH GFR
Anion gap: 7 (ref 5–15)
BUN: 16 mg/dL (ref 6–20)
CO2: 32 mmol/L (ref 22–32)
Calcium: 8.8 mg/dL — ABNORMAL LOW (ref 8.9–10.3)
Chloride: 101 mmol/L (ref 98–111)
Creatinine, Ser: 0.69 mg/dL (ref 0.44–1.00)
GFR, Estimated: >60 mL/min
Glucose, Bld: 138 mg/dL — ABNORMAL HIGH (ref 70–99)
Potassium: 4.3 mmol/L (ref 3.5–5.1)
Sodium: 139 mmol/L (ref 135–145)

## 2024-12-27 LAB — CULTURE, BLOOD (ROUTINE X 2)

## 2024-12-27 LAB — PROCALCITONIN: Procalcitonin: <0.1 ng/mL

## 2024-12-27 LAB — C-REACTIVE PROTEIN: CRP: <0.5 mg/dL

## 2024-12-27 LAB — MAGNESIUM: Magnesium: 2.5 mg/dL — ABNORMAL HIGH (ref 1.7–2.4)

## 2024-12-27 NOTE — Progress Notes (Addendum)
" ° °  NAME:  Deborah Melendez, MRN:  [id number], DOB:  1976-04-19, LOS: 3 ADMISSION DATE:  12/24/2024, CONSULTATION DATE: 12/26/2024 REFERRING MD: Dr. Dennise, CHIEF COMPLAINT: Hypoxemic respiratory failure  History of Present Illness:  48 year old lady with a history of asthma/COPD, chronic respiratory failure on oxygen  supplementation who came in with worsening shortness of breath, found to have exacerbation of chronic respiratory failure with viral respiratory infection positive for RSV and influenza B Now on high flow nasal cannula Ongoing tobacco use, history of substance abuse.  Usually on 3 L of oxygen  at home Had about 1 week of worsening shortness of breath, productive cough of yellow sputum, sore throat, fevers She has also run out of her usual inhalers.  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 rate 20, height 5' 4 (1.626 m), weight 68 kg, SpO2 99%.    FiO2 (%):  [40 %] 40 %   Intake/Output Summary (Last 24 hours) at 12/27/2024  1556 Last data filed at 12/27/2024 0800 Gross per 24 hour  Intake 650 ml  Output --  Net 650 ml   Filed Weights   12/24/24 1357  Weight: 68 kg    Examination: General: Middle-age, does not appear to be in distress, short of breath with activity HENT: Moist oral mucosa Lungs: Rhonchi Cardiovascular: S1-S2 appreciated Abdomen: Soft, bowel sounds appreciated Extremities: No clubbing, no edema Neuro: Alert and oriented GU:   I reviewed last 24 h vitals and pain scores, last 48 h intake and output, last 24 h labs and trends, and last 24 h imaging results.  Acute on chronic respiratory failure Acute exacerbation of chronic obstructive pulmonary disease Influenza B and RSV infection She has underlying COPD/asthma and was on oxygen  prior to - Continue Tamiflu  - Continue lines of care  Continue oxygen  supplementation -Continue steroids  Active smoker - Counseled  History of hypertension Continue Cardizem    "

## 2024-12-27 NOTE — Plan of Care (Signed)
 Patient is progressing towards goals of care.    Problem: Education: Goal: Knowledge of General Education information will improve Description: Including pain rating scale, medication(s)/side effects and non-pharmacologic comfort measures Outcome: Progressing   Problem: Health Behavior/Discharge Planning: Goal: Ability to manage health-related needs will improve Outcome: Progressing   Problem: Clinical Measurements: Goal: Ability to maintain clinical measurements within normal limits will improve Outcome: Progressing Goal: Will remain free from infection Outcome: Progressing Goal: Diagnostic test results will improve Outcome: Progressing Goal: Respiratory complications will improve Outcome: Progressing Goal: Cardiovascular complication will be avoided Outcome: Progressing   Problem: Activity: Goal: Risk for activity intolerance will decrease Outcome: Progressing   Problem: Nutrition: Goal: Adequate nutrition will be maintained Outcome: Progressing   Problem: Coping: Goal: Level of anxiety will decrease Outcome: Progressing   Problem: Elimination: Goal: Will not experience complications related to bowel motility Outcome: Progressing Goal: Will not experience complications related to urinary retention Outcome: Progressing   Problem: Pain Managment: Goal: General experience of comfort will improve and/or be controlled Outcome: Progressing   Problem: Safety: Goal: Ability to remain free from injury will improve Outcome: Progressing   Problem: Skin Integrity: Goal: Risk for impaired skin integrity will decrease Outcome: Progressing   Problem: Education: Goal: Knowledge of disease or condition will improve Outcome: Progressing Goal: Knowledge of the prescribed therapeutic regimen will improve Outcome: Progressing Goal: Individualized Educational Video(s) Outcome: Progressing   Problem: Activity: Goal: Ability to tolerate increased activity will  improve Outcome: Progressing Goal: Will verbalize the importance of balancing activity with adequate rest periods Outcome: Progressing   Problem: Respiratory: Goal: Ability to maintain a clear airway will improve Outcome: Progressing Goal: Levels of oxygenation will improve Outcome: Progressing Goal: Ability to maintain adequate ventilation will improve Outcome: Progressing

## 2024-12-27 NOTE — Progress Notes (Signed)
 "                                                                                                                                                                                                                                                                                PROGRESS NOTE     Patient Demographics:    Deborah Melendez, is a 48 y.o. female, DOB - 04-07-1976, FMW:[id number]  Outpatient Primary MD for the patient is Newlin, Enobong, MD    LOS - 3  Admit date - 12/24/2024    Chief Complaint  Patient presents with   Respiratory Distress       Brief Narrative (HPI from H&P)    48 y.o. female with a history of 3L O2-dependent COPD/asthma, substance abuse, and ongoing tobacco use who presented to the ED in respiratory distress. She reports 7 days of progressively worsening severity of cough becoming productive of yellow sputum, nasal discharge, sore throat, and fevers. She has moved from [place], [place] and hasn't established with MD yet, has run out of albuterol  neb solution and doesn't have inhalers, so has only been using nyquil for symptoms with little effect.  She presented to [facility] where she was diagnosed with acute on chronic hypoxic respiratory failure requiring BiPAP/heated high flow oxygen , she was diagnosed with influenza B infection along with respiratory synctial virus infection, she was transferred to [facility] for further care.   Subjective:   Patient in bed, appears comfortable, denies any headache, no fever, no chest pain or pressure, improving shortness of breath , no abdominal pain. No focal weakness.   Assessment  & Plan :    Acute on chronic hypoxic respiratory failure due to AECOPD due to viral pneumonitis - influenza B infection along with respiratory synctial virus infection :   She has underlying COPD/asthma, uses 3 L nasal cannula oxygen  at home, still smoking for which she has been counseled to abstain, now has significant hypoxia requiring 15  L of heated high flow, diffuse wheezing, continue steroids, continue Tamiflu , continue nebulizer treatments, requested to sit in chair use I-S and flutter valve for pulmonary toiletry, PCCM to follow.  Still has significant hypoxia.  SpO2:  100 % O2 Flow Rate (L/min): 5 L/min FiO2 (%): 40 %  Sepsis due to viral infection: No leukocytosis, stable procalcitonin and CRP.  1 out of 2 blood cultures suggesting contamination with coag negative staph.  Continue to monitor with supportive care.  Ongoing smoker.  Counseled to quit.  NicoDerm patch.    Chest tightness: Suspect this is noncardiac.  Resolved.  Essential hypertension.  For now continue home Cardizem  along with as needed hydralazine .      Condition - Extremely Guarded  Family Communication  : None present  Code Status : Full code  Consults  : PCCM  PUD Prophylaxis :     Procedures  :            Disposition Plan  :    Status is: Inpatient   DVT Prophylaxis  :    Place TED hose Start: 12/26/24 1037 enoxaparin  (LOVENOX ) injection 40 mg Start: 12/25/24 1000    Lab Results  Component Value Date   PLT 151 12/27/2024    Diet :  Diet Order             Diet regular Room service appropriate? Yes; Fluid consistency: Thin  Diet effective now                    Inpatient Medications  Scheduled Meds:  benzonatate   100 mg Oral TID   dextromethorphan -guaiFENesin   1 tablet Oral BID   diltiazem   30 mg Oral Q8H   enoxaparin  (LOVENOX ) injection  40 mg Subcutaneous Daily   fluticasone  furoate-vilanterol  1 puff Inhalation Daily   guaiFENesin   1,200 mg Oral BID   ipratropium-albuterol   3 mL Nebulization Q6H   methylPREDNISolone  (SOLU-MEDROL ) injection  80 mg Intravenous Q12H   nicotine   21 mg Transdermal Daily   oseltamivir   75 mg Oral BID   polyethylene glycol  17 g Oral BID   sodium chloride  flush  3 mL Intravenous Q12H   Continuous Infusions: PRN Meds:.acetaminophen  **OR** acetaminophen , albuterol ,  hydrALAZINE , HYDROcodone -acetaminophen , hydrOXYzine , menthol , ondansetron  **OR** ondansetron  (ZOFRAN ) IV, phenol  Antibiotics  :    Anti-infectives (From admission, onward)    Start     Dose/Rate Route Frequency Ordered Stop   12/24/24 2245  oseltamivir  (TAMIFLU ) capsule 75 mg        75 mg Oral 2 times daily 12/24/24 2234 12/29/24 2159   12/24/24 1430  ceFEPIme  (MAXIPIME ) 2 g in sodium chloride  0.9 % 100 mL IVPB        2 g 200 mL/hr over 30 Minutes Intravenous  Once 12/24/24 1426 12/24/24 1536   12/24/24 1430  metroNIDAZOLE  (FLAGYL ) IVPB 500 mg        500 mg 100 mL/hr over 60 Minutes Intravenous  Once 12/24/24 1426 12/24/24 1643   12/24/24 1430  vancomycin  (VANCOCIN ) IVPB 1000 mg/200 mL premix        1,000 mg 200 mL/hr over 60 Minutes Intravenous  Once 12/24/24 1426 12/24/24 1609         Objective:   Vitals:   12/27/24 0547 12/27/24 0558 12/27/24 0801 12/27/24 0818  BP: (!) 142/114  (!) 145/110   Pulse: 87  99   Resp: 20   20  Temp:  98.2 F (36.8 C) 97.7 F (36.5 C)   TempSrc:  Oral Oral   SpO2: 95%  100%   Weight:      Height:        Wt Readings from Last 3 Encounters:  12/24/24 68 kg  11/28/24 68 kg  10/23/24 68 kg     Intake/Output Summary (Last 24 hours) at 12/27/2024 1026 Last data filed at 12/26/2024 1800 Gross per 24 hour  Intake 550 ml  Output --  Net 550 ml     Physical Exam  Awake Alert, No new F.N deficits, Normal affect [place].AT,PERRAL Supple Neck, No JVD,   Symmetrical Chest wall movement, Good air movement bilaterally, CTAB RRR,No Gallops,Rubs or new Murmurs,  +ve B.Sounds, Abd Soft, No tenderness,   No Cyanosis, Clubbing or edema         Data Review:    Recent Labs  Lab 12/24/24 1351 12/24/24 2001 12/25/24 0828 12/26/24 0610 12/27/24 0439  WBC 4.3 4.4 6.4 3.2* 5.6  HGB 16.3* 13.8 14.3 14.1 13.4  HCT 48.1* 41.3 43.4 42.3 41.4  PLT 207 160 171 148* 151  MCV 94.7 96.0 98.9 96.8 98.1  MCH 32.1 32.1 32.6 32.3 31.8  MCHC 33.9  33.4 32.9 33.3 32.4  RDW 13.4 13.6 13.7 13.7 13.7  LYMPHSABS  --  0.3*  0.3*  --  0.5* 0.8  MONOABS  --  0.1  0.1  --  0.2 0.3  EOSABS  --  0.0  0.0  --  0.0 0.0  BASOSABS  --  0.0  0.0  --  0.0 0.0    Recent Labs  Lab 12/24/24 1351 12/24/24 1426 12/24/24 1502 12/24/24 1528 12/24/24 1737 12/24/24 2001 12/25/24 0828 12/26/24 0610 12/27/24 0439  NA 132*  --   --   --   --   --  138 139 139  K 4.0  --   --   --   --   --  4.2 4.8 4.3  CL 98  --   --   --   --   --  105 103 101  CO2 19*  --   --   --   --   --  22 29 32  ANIONGAP 15  --   --   --   --   --  12 8 7   GLUCOSE 103*  --   --   --   --   --  111* 136* 138*  BUN 10  --   --   --   --   --  9 11 16   CREATININE 0.96  --   --   --   --   --  0.84 0.72 0.69  AST  --  48*  --   --   --   --  41  --   --   ALT  --  27  --   --   --   --  20  --   --   ALKPHOS  --  90  --   --   --   --  72  --   --   BILITOT  --  0.5  --   --   --   --  0.3  --   --   ALBUMIN   --  4.4  --   --   --   --  3.9  --   --   CRP  --   --   --   --   --   --   --  1.4* <0.5  PROCALCITON  --   --   --   --   --  0.14  --  <0.10 <0.10  LATICACIDVEN  --   --  1.4 1.7 1.7  --   --   --   --   INR  --  1.0  --   --   --   --   --   --   --   MG  --   --   --   --   --   --   --   --  2.5*  CALCIUM 9.2  --   --   --   --   --  8.4* 8.7* 8.8*      Recent Labs  Lab 12/24/24 1351 12/24/24 1426 12/24/24 1502 12/24/24 1528 12/24/24 1737 12/24/24 2001 12/25/24 0828 12/26/24 0610 12/27/24 0439  CRP  --   --   --   --   --   --   --  1.4* <0.5  PROCALCITON  --   --   --   --   --  0.14  --  <0.10 <0.10  LATICACIDVEN  --   --  1.4 1.7 1.7  --   --   --   --   INR  --  1.0  --   --   --   --   --   --   --   MG  --   --   --   --   --   --   --   --  2.5*  CALCIUM 9.2  --   --   --   --   --  8.4* 8.7* 8.8*    --------------------------------------------------------------------------------------------------------------- Lab Results   Component Value Date   CHOL 148 08/25/2018   HDL 52 08/25/2018   LDLCALC 83 08/25/2018   TRIG 99 10/21/2018   CHOLHDL 2.8 08/25/2018    Lab Results  Component Value Date   HGBA1C 5.3 08/25/2018   No results for input(s): TSH, T4TOTAL, FREET4, T3FREE, THYROIDAB in the last 72 hours. No results for input(s): VITAMINB12, FOLATE, FERRITIN, TIBC, IRON, RETICCTPCT in the last 72 hours. ------------------------------------------------------------------------------------------------------------------ Cardiac Enzymes No results for input(s): CKMB, TROPONINI, MYOGLOBIN in the last 168 hours.  Invalid input(s): CK  Micro Results Recent Results (from the past 240 hours)  Resp panel by RT-PCR (RSV, Flu A&B, Covid) Anterior Nasal Swab     Status: Abnormal   Collection Time: 12/24/24  2:17 PM   Specimen: Anterior Nasal Swab  Result Value Ref Range Status   SARS Coronavirus 2 by RT PCR NEGATIVE NEGATIVE Final   Influenza A by PCR NEGATIVE NEGATIVE Final   Influenza B by PCR POSITIVE (A) NEGATIVE Final    Comment: (NOTE) The Xpert Xpress SARS-CoV-2/FLU/RSV plus assay is intended as an aid in the diagnosis of influenza from Nasopharyngeal swab specimens and should not be used as a sole basis for treatment. Nasal washings and aspirates are unacceptable for Xpert Xpress SARS-CoV-2/FLU/RSV testing.  Fact Sheet for Patients: bloggercourse.com  Fact Sheet for Healthcare Providers: seriousbroker.it  This test is not yet approved or cleared by the [place]  FDA and has been authorized for detection and/or diagnosis of SARS-CoV-2 by FDA under an Emergency Use Authorization (EUA). This EUA will remain in effect (meaning this test can be used) for the duration of the COVID-19 declaration under Section 564(b)(1) of the Act, 21 U.S.C. section 360bbb-3(b)(1), unless the authorization is terminated or revoked.      Resp Syncytial Virus by PCR POSITIVE (A) NEGATIVE Final    Comment: (NOTE) Fact Sheet for Patients: bloggercourse.com  Fact Sheet for Healthcare Providers: seriousbroker.it  This test is not yet approved or cleared by the [place]  FDA and has been authorized for detection and/or diagnosis of SARS-CoV-2 by FDA under an Emergency Use Authorization (EUA). This EUA will remain in effect (meaning this test can be used) for the duration of the COVID-19 declaration under Section 564(b)(1) of the Act, 21 U.S.C. section 360bbb-3(b)(1), unless the authorization is terminated or revoked.  Performed at [facility], [address] [place], [place] [address]   Respiratory (~20 pathogens) panel by PCR     Status: Abnormal   Collection Time: 12/24/24  2:17 PM   Specimen: Nasopharyngeal Swab; Respiratory  Result Value Ref Range Status   Adenovirus NOT DETECTED NOT DETECTED Final   Coronavirus 229E NOT DETECTED NOT DETECTED Final    Comment: (NOTE) The Coronavirus on the Respiratory Panel, DOES NOT test for the novel  Coronavirus (2019 nCoV)    Coronavirus HKU1 NOT DETECTED NOT DETECTED Final   Coronavirus NL63 NOT DETECTED NOT DETECTED Final   Coronavirus OC43 NOT DETECTED NOT DETECTED Final   Metapneumovirus NOT DETECTED NOT DETECTED Final   Rhinovirus / Enterovirus NOT DETECTED NOT DETECTED Final   Influenza A NOT DETECTED NOT DETECTED Final   Influenza B DETECTED (A) NOT DETECTED Final   Parainfluenza Virus 1 NOT DETECTED NOT DETECTED Final   Parainfluenza Virus 2 NOT DETECTED NOT DETECTED Final   Parainfluenza Virus 3 NOT DETECTED NOT DETECTED Final   Parainfluenza Virus 4 NOT DETECTED NOT DETECTED Final   Respiratory Syncytial Virus DETECTED (A) NOT DETECTED Final   Bordetella pertussis NOT DETECTED NOT DETECTED Final   Bordetella Parapertussis NOT DETECTED NOT DETECTED Final   Chlamydophila pneumoniae NOT DETECTED NOT  DETECTED Final   Mycoplasma pneumoniae NOT DETECTED NOT DETECTED Final    Comment: Performed at [facility], [address] [place], [place] [address]  Blood Culture (routine x 2)     Status: None (Preliminary result)   Collection Time: 12/24/24  2:35 PM   Specimen: BLOOD  Result Value Ref Range Status   Specimen Description BLOOD BLOOD RIGHT FOREARM  Final   Special Requests   Final    BOTTLES DRAWN AEROBIC AND ANAEROBIC Blood Culture results may not be optimal due to an inadequate volume of blood received in culture bottles   Culture   Final    NO GROWTH 3 DAYS Performed at [facility], [address], [place], [place] [address]    Report Status PENDING  Incomplete  Blood Culture (routine x 2)     Status: Abnormal   Collection Time: 12/24/24  2:45 PM   Specimen: BLOOD RIGHT HAND  Result Value Ref Range Status   Specimen Description BLOOD RIGHT HAND  Final   Special Requests   Final    BOTTLES DRAWN AEROBIC AND ANAEROBIC Blood Culture results may not be optimal due to an inadequate volume of blood received in culture bottles   Culture  Setup Time   Final    GRAM POSITIVE COCCI IN CLUSTERS AEROBIC BOTTLE ONLY CRITICAL RESULT CALLED TO, READ BACK BY AND VERIFIED WITH: PHARMD ERIN W. [id number] AT 1941, ADC    Culture (A)  Final    STAPHYLOCOCCUS CAPITIS THE SIGNIFICANCE OF ISOLATING THIS ORGANISM FROM A SINGLE SET OF BLOOD CULTURES WHEN MULTIPLE SETS ARE DRAWN IS UNCERTAIN. PLEASE NOTIFY THE MICROBIOLOGY DEPARTMENT WITHIN ONE WEEK IF SPECIATION AND SENSITIVITIES ARE REQUIRED. Performed at [facility], [address] [place], [place] [address]  Report Status 12/27/2024 FINAL  Final  Blood Culture ID Panel (Reflexed)     Status: Abnormal   Collection Time: 12/24/24  2:45 PM  Result Value Ref Range Status   Enterococcus faecalis NOT DETECTED NOT DETECTED Final   Enterococcus Faecium NOT DETECTED NOT DETECTED Final   Listeria monocytogenes NOT DETECTED NOT  DETECTED Final   Staphylococcus species DETECTED (A) NOT DETECTED Final    Comment: CRITICAL RESULT CALLED TO, READ BACK BY AND VERIFIED WITH: PHARMD ERIN W. [id number] AT 1941, ADC    Staphylococcus aureus (BCID) NOT DETECTED NOT DETECTED Final   Staphylococcus epidermidis NOT DETECTED NOT DETECTED Final   Staphylococcus lugdunensis NOT DETECTED NOT DETECTED Final   Streptococcus species NOT DETECTED NOT DETECTED Final   Streptococcus agalactiae NOT DETECTED NOT DETECTED Final   Streptococcus pneumoniae NOT DETECTED NOT DETECTED Final   Streptococcus pyogenes NOT DETECTED NOT DETECTED Final   A.calcoaceticus-baumannii NOT DETECTED NOT DETECTED Final   Bacteroides fragilis NOT DETECTED NOT DETECTED Final   Enterobacterales NOT DETECTED NOT DETECTED Final   Enterobacter cloacae complex NOT DETECTED NOT DETECTED Final   Escherichia coli NOT DETECTED NOT DETECTED Final   Klebsiella aerogenes NOT DETECTED NOT DETECTED Final   Klebsiella oxytoca NOT DETECTED NOT DETECTED Final   Klebsiella pneumoniae NOT DETECTED NOT DETECTED Final   Proteus species NOT DETECTED NOT DETECTED Final   Salmonella species NOT DETECTED NOT DETECTED Final   Serratia marcescens NOT DETECTED NOT DETECTED Final   Haemophilus influenzae NOT DETECTED NOT DETECTED Final   Neisseria meningitidis NOT DETECTED NOT DETECTED Final   Pseudomonas aeruginosa NOT DETECTED NOT DETECTED Final   Stenotrophomonas maltophilia NOT DETECTED NOT DETECTED Final   Candida albicans NOT DETECTED NOT DETECTED Final   Candida auris NOT DETECTED NOT DETECTED Final   Candida glabrata NOT DETECTED NOT DETECTED Final   Candida krusei NOT DETECTED NOT DETECTED Final   Candida parapsilosis NOT DETECTED NOT DETECTED Final   Candida tropicalis NOT DETECTED NOT DETECTED Final   Cryptococcus neoformans/gattii NOT DETECTED NOT DETECTED Final    Comment: Performed at [facility], [address], [place], [place] [address]  MRSA Next Gen by PCR,  Nasal     Status: None   Collection Time: 12/26/24  5:51 AM   Specimen: Nasal Mucosa; Nasal Swab  Result Value Ref Range Status   MRSA by PCR Next Gen NOT DETECTED NOT DETECTED Final    Comment: (NOTE) The GeneXpert MRSA Assay (FDA approved for NASAL specimens only), is one component of a comprehensive MRSA colonization surveillance program. It is not intended to diagnose MRSA infection nor to guide or monitor treatment for MRSA infections. Test performance is not FDA approved in patients less than 104 years old. Performed at [facility], [address] [place], [place] [address]     Radiology Report DG Chest Port 1 View Result Date: 12/26/2024 EXAM: 1 VIEW(S) XRAY OF THE CHEST 12/26/2024 07:00:00 AM COMPARISON: 12/24/2024 CLINICAL HISTORY: SOB (shortness of breath) FINDINGS: LUNGS AND PLEURA: No focal pulmonary opacity. No pleural effusion. No pneumothorax. HEART AND MEDIASTINUM: No acute abnormality of the cardiac and mediastinal silhouettes. BONES AND SOFT TISSUES: No acute osseous abnormality. IMPRESSION: 1. No acute process. Electronically signed by: Waddell Calk MD 12/26/2024 07:07 AM EST RP Workstation: [id number]     Signature  -   Lavada Stank M.D on 12/27/2024 at 10:26 AM   -  To page go to [email or web address]   "

## 2024-12-27 NOTE — Plan of Care (Signed)

## 2024-12-28 LAB — CBC WITH DIFFERENTIAL/PLATELET
Abs Immature Granulocytes: 0.03 K/uL (ref 0.00–0.07)
Basophils Absolute: 0 K/uL (ref 0.0–0.1)
Basophils Relative: 0 %
Eosinophils Absolute: 0 K/uL (ref 0.0–0.5)
Eosinophils Relative: 0 %
HCT: 41.2 % (ref 36.0–46.0)
Hemoglobin: 13.4 g/dL (ref 12.0–15.0)
Immature Granulocytes: 1 %
Lymphocytes Relative: 19 %
Lymphs Abs: 1.2 K/uL (ref 0.7–4.0)
MCH: 31.7 pg (ref 26.0–34.0)
MCHC: 32.5 g/dL (ref 30.0–36.0)
MCV: 97.4 fL (ref 80.0–100.0)
Monocytes Absolute: 0.4 K/uL (ref 0.1–1.0)
Monocytes Relative: 5 %
Neutro Abs: 4.9 K/uL (ref 1.7–7.7)
Neutrophils Relative %: 75 %
Platelets: 157 K/uL (ref 150–400)
RBC: 4.23 MIL/uL (ref 3.87–5.11)
RDW: 13.8 % (ref 11.5–15.5)
Smear Review: NORMAL
WBC: 6.5 K/uL (ref 4.0–10.5)
nRBC: 0 % (ref 0.0–0.2)

## 2024-12-28 LAB — BASIC METABOLIC PANEL WITH GFR
Anion gap: 7 (ref 5–15)
BUN: 17 mg/dL (ref 6–20)
CO2: 30 mmol/L (ref 22–32)
Calcium: 8.4 mg/dL — ABNORMAL LOW (ref 8.9–10.3)
Chloride: 101 mmol/L (ref 98–111)
Creatinine, Ser: 0.68 mg/dL (ref 0.44–1.00)
GFR, Estimated: >60 mL/min
Glucose, Bld: 183 mg/dL — ABNORMAL HIGH (ref 70–99)
Potassium: 3.8 mmol/L (ref 3.5–5.1)
Sodium: 138 mmol/L (ref 135–145)

## 2024-12-28 MED ORDER — POLYETHYLENE GLYCOL 3350 17 G PO PACK
17.0000 g | PACK | Freq: Two times a day (BID) | ORAL | Status: DC
  Administered 2024-12-28 – 2024-12-29 (×2): 17 g via ORAL
  Filled 2024-12-28: qty 1

## 2024-12-28 MED ORDER — MAGNESIUM HYDROXIDE 400 MG/5ML PO SUSP
30.0000 mL | Freq: Two times a day (BID) | ORAL | Status: AC
  Administered 2024-12-28: 30 mL via ORAL
  Filled 2024-12-28: qty 30

## 2024-12-28 MED ORDER — DOCUSATE SODIUM 100 MG PO CAPS
200.0000 mg | ORAL_CAPSULE | Freq: Two times a day (BID) | ORAL | Status: DC
  Administered 2024-12-28 – 2024-12-29 (×3): 200 mg via ORAL
  Filled 2024-12-28 (×3): qty 2

## 2024-12-28 MED ORDER — METHYLPREDNISOLONE SODIUM SUCC 40 MG IJ SOLR
40.0000 mg | Freq: Two times a day (BID) | INTRAMUSCULAR | Status: DC
  Administered 2024-12-28 – 2024-12-29 (×2): 40 mg via INTRAVENOUS
  Filled 2024-12-28 (×2): qty 1

## 2024-12-28 MED ORDER — MELATONIN 5 MG PO TABS
5.0000 mg | ORAL_TABLET | Freq: Every evening | ORAL | Status: DC | PRN
  Administered 2024-12-28: 5 mg via ORAL
  Filled 2024-12-28: qty 1

## 2024-12-28 NOTE — Progress Notes (Signed)
" ° °  NAME:  Deborah Melendez, MRN:  [id number], DOB:  04/19/1976, LOS: 4 ADMISSION DATE:  12/24/2024, CONSULTATION DATE: 12/26/2024 REFERRING MD: Dr. Dennise, CHIEF COMPLAINT: Hypoxemic respiratory failure  History of Present Illness:  49 year old lady with a history of asthma/COPD, chronic respiratory failure on oxygen  supplementation who came in with worsening shortness of breath, found to have exacerbation of chronic respiratory failure with viral respiratory infection positive for RSV and influenza B Now on high flow nasal cannula Ongoing tobacco use, history of substance abuse.  Usually on 3 L of oxygen  at home Had about 1 week of worsening shortness of breath, productive cough of yellow sputum, sore throat, fevers She has also run out of her usual inhalers.  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 rate 20, height 5' 4 (1.626 m), weight 68 kg, SpO2 97%.    FiO2 (%):  [36 %] 36 %    Intake/Output Summary (Last 24 hours) at 12/28/2024 1514 Last data filed at 12/27/2024 1800 Gross per 24 hour  Intake 300 ml  Output --  Net 300 ml   Filed Weights   12/24/24 1357  Weight: 68 kg    Examination: General: Middle-age, does not appear to be in distress at rest  HENT: Moist oral mucosa Lungs: Bilateral rhonchi Cardiovascular: S1-S2 appreciated Abdomen: Soft, bowel sounds appreciated Extremities: No edema, no clubbing Neuro: Alert and oriented  I reviewed last 24 h vitals and pain scores, last 48 h intake and output, last 24 h labs and trends, and last 24 h imaging results.  Acute on chronic respiratory failure Acute exacerbation of chronic obstructive pulmonary disease Influenza B and RSV infection Underlying COPD for which she is on oxygen  at baseline  Continue Tamiflu  Continue pulmonary toileting  Continue oxygen  supplementation Steroids  Smoking cessation counseling  On Cardizem  for hypertension  Will follow "

## 2024-12-28 NOTE — Progress Notes (Signed)
 Pt refusing tele, agreeable to o2 monitor only. MD notified. Education provided.

## 2024-12-28 NOTE — TOC Progression Note (Signed)
 Transition of Care [facility]) - Progression Note    Patient Details  Name: Deborah Melendez MRN: [id number] Date of Birth: 09-Aug-1976  [facility] CM/SW Contact  Corean JAYSON Canary, RN Phone Number: 12/28/2024, 11:26 AM  Clinical Narrative:     Patient is still on HFNC, normally wears 3LPM at home. She recently moved from [place], [place] and is Medicaid Pending.  She lives with family.  Will likely need MATCH medication assistance when discharged IPCM will follow                     Expected Discharge Plan and Services                                               Social Drivers of Health (SDOH) Interventions SDOH Screenings   Food Insecurity: No Food Insecurity (12/25/2024)  Housing: Low Risk (12/25/2024)  Transportation Needs: No Transportation Needs (12/25/2024)  Utilities: Not At Risk (12/25/2024)  Tobacco Use: High Risk (12/24/2024)    Readmission Risk Interventions     No data to display

## 2024-12-28 NOTE — Plan of Care (Signed)

## 2024-12-28 NOTE — Progress Notes (Signed)
 PT Cancellation Note  Patient Details Name: [name] MRN: [id number] DOB: 08/03/1976   Cancelled Treatment:     Pt declining session today, stating she is having difficulty breathing and increased chest tightness, asking for RT to return. Left room to inform RN, RN and respiratory entering room at that time. Will follow up as appropriate at a later date.   [name], PT, DPT  Acute Rehabilitation Services         Office: [phone]      Sabra MARLA Morel 12/28/2024, 1:16 PM

## 2024-12-28 NOTE — Progress Notes (Signed)
 "                                                                                                                                                                                                                                                                                PROGRESS NOTE     Patient Demographics:    Deborah Melendez, is a 49 y.o. female, DOB - [birth year], FMW:[id number]  Outpatient Primary MD for the patient is Newlin, Enobong, MD    LOS - 4  Admit date - 12/24/2024    Chief Complaint  Patient presents with   Respiratory Distress       Brief Narrative (HPI from H&P)    49 y.o. female with a history of 3L O2-dependent COPD/asthma, substance abuse, and ongoing tobacco use who presented to the ED in respiratory distress. She reports 7 days of progressively worsening severity of cough becoming productive of yellow sputum, nasal discharge, sore throat, and fevers. She has moved from [place], [place] and hasn't established with MD yet, has run out of albuterol  neb solution and doesn't have inhalers, so has only been using nyquil for symptoms with little effect.  She presented to [facility] where she was diagnosed with acute on chronic hypoxic respiratory failure requiring BiPAP/heated high flow oxygen , she was diagnosed with influenza B infection along with respiratory synctial virus infection, she was transferred to [facility] for further care.   Subjective:   Patient in bed, appears comfortable, denies any headache, no fever, no chest pain or pressure, much improved shortness of breath , no abdominal pain. No focal weakness.   Assessment  & Plan :    Acute on chronic hypoxic respiratory failure due to AECOPD due to viral pneumonitis - influenza B infection along with respiratory synctial virus infection :   She has underlying COPD/asthma, uses 3 L nasal cannula oxygen  at home, still smoking for which she has been counseled to abstain, now has significant hypoxia  requiring 50 L of heated high flow at the time of admission, diffuse wheezing, continue steroids, continue Tamiflu , continue nebulizer treatments, requested to sit in chair use I-S and flutter valve for pulmonary toiletry, PCCM to follow.  Now much improved start tapering steroids, encouraged to sit in chair use I-S and flutter valve and abstain from cocaine abuse.  SpO2: 99 % O2 Flow Rate (L/min): 4 L/min FiO2 (%): 36 %  Sepsis due to viral infection: No leukocytosis, stable procalcitonin and CRP.  1 out of 2 blood cultures suggesting contamination with coag negative staph.  Continue to monitor with supportive care.  Ongoing smoker.  Counseled to quit.  NicoDerm patch.    Chest tightness: Suspect this is noncardiac.  Resolved.  Ongoing polysubstance abuse including cocaine abuse.  Counseled to abstain.    Essential hypertension.  For now continue home Cardizem  along with as needed hydralazine .      Condition - Extremely Guarded  Family Communication  : None present  Code Status : Full code  Consults  : PCCM  PUD Prophylaxis :     Procedures  :            Disposition Plan  :    Status is: Inpatient   DVT Prophylaxis  :    Place TED hose Start: 12/26/24 1037 enoxaparin  (LOVENOX ) injection 40 mg Start: 12/25/24 1000    Lab Results  Component Value Date   PLT 157 12/28/2024    Diet :  Diet Order             Diet regular Room service appropriate? Yes; Fluid consistency: Thin  Diet effective now                    Inpatient Medications  Scheduled Meds:  benzonatate   100 mg Oral TID   dextromethorphan -guaiFENesin   1 tablet Oral BID   diltiazem   30 mg Oral Q8H   enoxaparin  (LOVENOX ) injection  40 mg Subcutaneous Daily   fluticasone  furoate-vilanterol  1 puff Inhalation Daily   guaiFENesin   1,200 mg Oral BID   ipratropium-albuterol   3 mL Nebulization Q6H   methylPREDNISolone  (SOLU-MEDROL ) injection  40 mg Intravenous Q12H   nicotine   21 mg Transdermal  Daily   oseltamivir   75 mg Oral BID   polyethylene glycol  17 g Oral BID   sodium chloride  flush  3 mL Intravenous Q12H   Continuous Infusions: PRN Meds:.acetaminophen  **OR** acetaminophen , albuterol , hydrALAZINE , HYDROcodone -acetaminophen , hydrOXYzine , menthol , ondansetron  **OR** ondansetron  (ZOFRAN ) IV, phenol  Antibiotics  :    Anti-infectives (From admission, onward)    Start     Dose/Rate Route Frequency Ordered Stop   12/24/24 2245  oseltamivir  (TAMIFLU ) capsule 75 mg        75 mg Oral 2 times daily 12/24/24 2234 12/29/24 2159   12/24/24 1430  ceFEPIme  (MAXIPIME ) 2 g in sodium chloride  0.9 % 100 mL IVPB        2 g 200 mL/hr over 30 Minutes Intravenous  Once 12/24/24 1426 12/24/24 1536   12/24/24 1430  metroNIDAZOLE  (FLAGYL ) IVPB 500 mg        500 mg 100 mL/hr over 60 Minutes Intravenous  Once 12/24/24 1426 12/24/24 1643   12/24/24 1430  vancomycin  (VANCOCIN ) IVPB 1000 mg/200 mL premix        1,000 mg 200 mL/hr over 60 Minutes Intravenous  Once 12/24/24 1426 12/24/24 1609         Objective:   Vitals:   12/27/24 2200 12/27/24 2300 12/28/24 0300 12/28/24 0801  BP:   (!) 149/108 (!) 134/94  Pulse: 92  79   Resp:   (!) 25 20  Temp:  97.6 F (36.4 C) 97.6 F (36.4 C) 98.1 F (  36.7 C)  TempSrc:  Oral Axillary Oral  SpO2: 99%  99%   Weight:      Height:        Wt Readings from Last 3 Encounters:  12/24/24 68 kg  11/28/24 68 kg  10/23/24 68 kg     Intake/Output Summary (Last 24 hours) at 12/28/2024 0855 Last data filed at 12/27/2024 1800 Gross per 24 hour  Intake 550 ml  Output --  Net 550 ml     Physical Exam  Awake Alert, No new F.N deficits, Normal affect [place].AT,PERRAL Supple Neck, No JVD,   Symmetrical Chest wall movement, Good air movement bilaterally, CTAB RRR,No Gallops,Rubs or new Murmurs,  +ve B.Sounds, Abd Soft, No tenderness,   No Cyanosis, Clubbing or edema         Data Review:    Recent Labs  Lab 12/24/24 2001 12/25/24 0828  12/26/24 0610 12/27/24 0439 12/28/24 0417  WBC 4.4 6.4 3.2* 5.6 6.5  HGB 13.8 14.3 14.1 13.4 13.4  HCT 41.3 43.4 42.3 41.4 41.2  PLT 160 171 148* 151 157  MCV 96.0 98.9 96.8 98.1 97.4  MCH 32.1 32.6 32.3 31.8 31.7  MCHC 33.4 32.9 33.3 32.4 32.5  RDW 13.6 13.7 13.7 13.7 13.8  LYMPHSABS 0.3*  0.3*  --  0.5* 0.8 1.2  MONOABS 0.1  0.1  --  0.2 0.3 0.4  EOSABS 0.0  0.0  --  0.0 0.0 0.0  BASOSABS 0.0  0.0  --  0.0 0.0 0.0    Recent Labs  Lab 12/24/24 1351 12/24/24 1426 12/24/24 1502 12/24/24 1528 12/24/24 1737 12/24/24 2001 12/25/24 0828 12/26/24 0610 12/27/24 0439 12/28/24 0417  NA 132*  --   --   --   --   --  138 139 139 138  K 4.0  --   --   --   --   --  4.2 4.8 4.3 3.8  CL 98  --   --   --   --   --  105 103 101 101  CO2 19*  --   --   --   --   --  22 29 32 30  ANIONGAP 15  --   --   --   --   --  12 8 7 7   GLUCOSE 103*  --   --   --   --   --  111* 136* 138* 183*  BUN 10  --   --   --   --   --  9 11 16 17   CREATININE 0.96  --   --   --   --   --  0.84 0.72 0.69 0.68  AST  --  48*  --   --   --   --  41  --   --   --   ALT  --  27  --   --   --   --  20  --   --   --   ALKPHOS  --  90  --   --   --   --  72  --   --   --   BILITOT  --  0.5  --   --   --   --  0.3  --   --   --   ALBUMIN   --  4.4  --   --   --   --  3.9  --   --   --  CRP  --   --   --   --   --   --   --  1.4* <0.5  --   PROCALCITON  --   --   --   --   --  0.14  --  <0.10 <0.10  --   LATICACIDVEN  --   --  1.4 1.7 1.7  --   --   --   --   --   INR  --  1.0  --   --   --   --   --   --   --   --   MG  --   --   --   --   --   --   --   --  2.5*  --   CALCIUM 9.2  --   --   --   --   --  8.4* 8.7* 8.8* 8.4*      Recent Labs  Lab 12/24/24 1351 12/24/24 1426 12/24/24 1502 12/24/24 1528 12/24/24 1737 12/24/24 2001 12/25/24 0828 12/26/24 0610 12/27/24 0439 12/28/24 0417  CRP  --   --   --   --   --   --   --  1.4* <0.5  --   PROCALCITON  --   --   --   --   --  0.14  --  <0.10 <0.10   --   LATICACIDVEN  --   --  1.4 1.7 1.7  --   --   --   --   --   INR  --  1.0  --   --   --   --   --   --   --   --   MG  --   --   --   --   --   --   --   --  2.5*  --   CALCIUM 9.2  --   --   --   --   --  8.4* 8.7* 8.8* 8.4*    --------------------------------------------------------------------------------------------------------------- Lab Results  Component Value Date   CHOL 148 08/25/2018   HDL 52 08/25/2018   LDLCALC 83 08/25/2018   TRIG 99 10/21/2018   CHOLHDL 2.8 08/25/2018    Lab Results  Component Value Date   HGBA1C 5.3 08/25/2018   No results for input(s): TSH, T4TOTAL, FREET4, T3FREE, THYROIDAB in the last 72 hours. No results for input(s): VITAMINB12, FOLATE, FERRITIN, TIBC, IRON, RETICCTPCT in the last 72 hours. ------------------------------------------------------------------------------------------------------------------ Cardiac Enzymes No results for input(s): CKMB, TROPONINI, MYOGLOBIN in the last 168 hours.  Invalid input(s): CK  Micro Results Recent Results (from the past 240 hours)  Resp panel by RT-PCR (RSV, Flu A&B, Covid) Anterior Nasal Swab     Status: Abnormal   Collection Time: 12/24/24  2:17 PM   Specimen: Anterior Nasal Swab  Result Value Ref Range Status   SARS Coronavirus 2 by RT PCR NEGATIVE NEGATIVE Final   Influenza A by PCR NEGATIVE NEGATIVE Final   Influenza B by PCR POSITIVE (A) NEGATIVE Final    Comment: (NOTE) The Xpert Xpress SARS-CoV-2/FLU/RSV plus assay is intended as an aid in the diagnosis of influenza from Nasopharyngeal swab specimens and should not be used as a sole basis for treatment. Nasal washings and aspirates are unacceptable for Xpert Xpress SARS-CoV-2/FLU/RSV testing.  Fact Sheet for Patients: bloggercourse.com  Fact Sheet for Healthcare Providers: seriousbroker.it  This test is not yet approved or cleared by  the [place]   FDA and has been authorized for detection and/or diagnosis of SARS-CoV-2 by FDA under an Emergency Use Authorization (EUA). This EUA will remain in effect (meaning this test can be used) for the duration of the COVID-19 declaration under Section 564(b)(1) of the Act, 21 U.S.C. section 360bbb-3(b)(1), unless the authorization is terminated or revoked.     Resp Syncytial Virus by PCR POSITIVE (A) NEGATIVE Final    Comment: (NOTE) Fact Sheet for Patients: bloggercourse.com  Fact Sheet for Healthcare Providers: seriousbroker.it  This test is not yet approved or cleared by the [place]  FDA and has been authorized for detection and/or diagnosis of SARS-CoV-2 by FDA under an Emergency Use Authorization (EUA). This EUA will remain in effect (meaning this test can be used) for the duration of the COVID-19 declaration under Section 564(b)(1) of the Act, 21 U.S.C. section 360bbb-3(b)(1), unless the authorization is terminated or revoked.  Performed at [facility], [address] [place], [place] [address]   Respiratory (~20 pathogens) panel by PCR     Status: Abnormal   Collection Time: 12/24/24  2:17 PM   Specimen: Nasopharyngeal Swab; Respiratory  Result Value Ref Range Status   Adenovirus NOT DETECTED NOT DETECTED Final   Coronavirus 229E NOT DETECTED NOT DETECTED Final    Comment: (NOTE) The Coronavirus on the Respiratory Panel, DOES NOT test for the novel  Coronavirus (2019 nCoV)    Coronavirus HKU1 NOT DETECTED NOT DETECTED Final   Coronavirus NL63 NOT DETECTED NOT DETECTED Final   Coronavirus OC43 NOT DETECTED NOT DETECTED Final   Metapneumovirus NOT DETECTED NOT DETECTED Final   Rhinovirus / Enterovirus NOT DETECTED NOT DETECTED Final   Influenza A NOT DETECTED NOT DETECTED Final   Influenza B DETECTED (A) NOT DETECTED Final   Parainfluenza Virus 1 NOT DETECTED NOT DETECTED Final   Parainfluenza Virus 2 NOT  DETECTED NOT DETECTED Final   Parainfluenza Virus 3 NOT DETECTED NOT DETECTED Final   Parainfluenza Virus 4 NOT DETECTED NOT DETECTED Final   Respiratory Syncytial Virus DETECTED (A) NOT DETECTED Final   Bordetella pertussis NOT DETECTED NOT DETECTED Final   Bordetella Parapertussis NOT DETECTED NOT DETECTED Final   Chlamydophila pneumoniae NOT DETECTED NOT DETECTED Final   Mycoplasma pneumoniae NOT DETECTED NOT DETECTED Final    Comment: Performed at [facility], [address], [place], [place] [address]  Blood Culture (routine x 2)     Status: None (Preliminary result)   Collection Time: 12/24/24  2:35 PM   Specimen: BLOOD  Result Value Ref Range Status   Specimen Description BLOOD BLOOD RIGHT FOREARM  Final   Special Requests   Final    BOTTLES DRAWN AEROBIC AND ANAEROBIC Blood Culture results may not be optimal due to an inadequate volume of blood received in culture bottles   Culture   Final    NO GROWTH 4 DAYS Performed at [facility], [address] [place], [place] [address]    Report Status PENDING  Incomplete  Blood Culture (routine x 2)     Status: Abnormal   Collection Time: 12/24/24  2:45 PM   Specimen: BLOOD RIGHT HAND  Result Value Ref Range Status   Specimen Description BLOOD RIGHT HAND  Final   Special Requests   Final    BOTTLES DRAWN AEROBIC AND ANAEROBIC Blood Culture results may not be optimal due to an inadequate volume of blood received in culture bottles   Culture  Setup Time  Final    GRAM POSITIVE COCCI IN CLUSTERS AEROBIC BOTTLE ONLY CRITICAL RESULT CALLED TO, READ BACK BY AND VERIFIED WITH: PHARMD ERIN W. [id number] AT 1941, ADC    Culture (A)  Final    STAPHYLOCOCCUS CAPITIS THE SIGNIFICANCE OF ISOLATING THIS ORGANISM FROM A SINGLE SET OF BLOOD CULTURES WHEN MULTIPLE SETS ARE DRAWN IS UNCERTAIN. PLEASE NOTIFY THE MICROBIOLOGY DEPARTMENT WITHIN ONE WEEK IF SPECIATION AND SENSITIVITIES ARE REQUIRED. Performed at [facility], [address], [place], [place] [address]    Report Status 12/27/2024 FINAL  Final  Blood Culture ID Panel (Reflexed)     Status: Abnormal   Collection Time: 12/24/24  2:45 PM  Result Value Ref Range Status   Enterococcus faecalis NOT DETECTED NOT DETECTED Final   Enterococcus Faecium NOT DETECTED NOT DETECTED Final   Listeria monocytogenes NOT DETECTED NOT DETECTED Final   Staphylococcus species DETECTED (A) NOT DETECTED Final    Comment: CRITICAL RESULT CALLED TO, READ BACK BY AND VERIFIED WITH: PHARMD ERIN W. [id number] AT 1941, ADC    Staphylococcus aureus (BCID) NOT DETECTED NOT DETECTED Final   Staphylococcus epidermidis NOT DETECTED NOT DETECTED Final   Staphylococcus lugdunensis NOT DETECTED NOT DETECTED Final   Streptococcus species NOT DETECTED NOT DETECTED Final   Streptococcus agalactiae NOT DETECTED NOT DETECTED Final   Streptococcus pneumoniae NOT DETECTED NOT DETECTED Final   Streptococcus pyogenes NOT DETECTED NOT DETECTED Final   A.calcoaceticus-baumannii NOT DETECTED NOT DETECTED Final   Bacteroides fragilis NOT DETECTED NOT DETECTED Final   Enterobacterales NOT DETECTED NOT DETECTED Final   Enterobacter cloacae complex NOT DETECTED NOT DETECTED Final   Escherichia coli NOT DETECTED NOT DETECTED Final   Klebsiella aerogenes NOT DETECTED NOT DETECTED Final   Klebsiella oxytoca NOT DETECTED NOT DETECTED Final   Klebsiella pneumoniae NOT DETECTED NOT DETECTED Final   Proteus species NOT DETECTED NOT DETECTED Final   Salmonella species NOT DETECTED NOT DETECTED Final   Serratia marcescens NOT DETECTED NOT DETECTED Final   Haemophilus influenzae NOT DETECTED NOT DETECTED Final   Neisseria meningitidis NOT DETECTED NOT DETECTED Final   Pseudomonas aeruginosa NOT DETECTED NOT DETECTED Final   Stenotrophomonas maltophilia NOT DETECTED NOT DETECTED Final   Candida albicans NOT DETECTED NOT DETECTED Final   Candida auris NOT DETECTED NOT DETECTED Final   Candida glabrata NOT DETECTED  NOT DETECTED Final   Candida krusei NOT DETECTED NOT DETECTED Final   Candida parapsilosis NOT DETECTED NOT DETECTED Final   Candida tropicalis NOT DETECTED NOT DETECTED Final   Cryptococcus neoformans/gattii NOT DETECTED NOT DETECTED Final    Comment: Performed at [facility], [address] [place], [place] [address]  MRSA Next Gen by PCR, Nasal     Status: None   Collection Time: 12/26/24  5:51 AM   Specimen: Nasal Mucosa; Nasal Swab  Result Value Ref Range Status   MRSA by PCR Next Gen NOT DETECTED NOT DETECTED Final    Comment: (NOTE) The GeneXpert MRSA Assay (FDA approved for NASAL specimens only), is one component of a comprehensive MRSA colonization surveillance program. It is not intended to diagnose MRSA infection nor to guide or monitor treatment for MRSA infections. Test performance is not FDA approved in patients less than 63 years old. Performed at [facility], [address], [place], [place] [address]     Radiology Report No results found.    Signature  -   Lavada Stank M.D on 12/28/2024 at 8:55 AM   -  To page go to [email or web address]   "

## 2024-12-28 NOTE — Plan of Care (Signed)

## 2024-12-28 NOTE — Progress Notes (Signed)
" °   12/27/24 2200  BiPAP/CPAP/SIPAP  Reason BIPAP/CPAP not in use Non-compliant (Pt. refused. Pt. educated on why to wear machine.)    "

## 2024-12-28 NOTE — Progress Notes (Signed)
" °   12/27/24 2200  BiPAP/CPAP/SIPAP  Reason BIPAP/CPAP not in use Non-compliant (Pt. refused. Pt. educated on why to wear machine.)  BiPAP/CPAP /SiPAP Vitals  Pulse Rate 92  SpO2 99 %  MEWS Score/Color  MEWS Score 0  MEWS Score Color Green    "

## 2024-12-29 ENCOUNTER — Other Ambulatory Visit (HOSPITAL_COMMUNITY): Payer: Self-pay

## 2024-12-29 LAB — CULTURE, BLOOD (ROUTINE X 2): Culture: NO GROWTH

## 2024-12-29 MED ORDER — HYDRALAZINE HCL 50 MG PO TABS
50.0000 mg | ORAL_TABLET | Freq: Three times a day (TID) | ORAL | Status: DC
  Administered 2024-12-29 (×2): 50 mg via ORAL
  Filled 2024-12-29 (×2): qty 1

## 2024-12-29 MED ORDER — DOCUSATE SODIUM 100 MG PO CAPS
200.0000 mg | ORAL_CAPSULE | Freq: Two times a day (BID) | ORAL | 0 refills | Status: AC
  Filled 2024-12-29: qty 10, 3d supply, fill #0

## 2024-12-29 MED ORDER — AMLODIPINE BESYLATE 10 MG PO TABS
10.0000 mg | ORAL_TABLET | Freq: Every day | ORAL | Status: DC
  Administered 2024-12-29: 10 mg via ORAL
  Filled 2024-12-29: qty 1

## 2024-12-29 MED ORDER — ALBUTEROL SULFATE (2.5 MG/3ML) 0.083% IN NEBU
2.5000 mg | INHALATION_SOLUTION | Freq: Four times a day (QID) | RESPIRATORY_TRACT | 0 refills | Status: DC | PRN
  Filled 2024-12-29: qty 90, 8d supply, fill #0

## 2024-12-29 MED ORDER — ALBUTEROL SULFATE HFA 108 (90 BASE) MCG/ACT IN AERS
2.0000 | INHALATION_SPRAY | Freq: Four times a day (QID) | RESPIRATORY_TRACT | 0 refills | Status: DC | PRN
  Filled 2024-12-29: qty 18, 30d supply, fill #0

## 2024-12-29 MED ORDER — MENTHOL 3 MG MT LOZG
1.0000 | LOZENGE | OROMUCOSAL | 0 refills | Status: AC | PRN
  Filled 2024-12-29: qty 15, fill #0

## 2024-12-29 MED ORDER — OSELTAMIVIR PHOSPHATE 75 MG PO CAPS
75.0000 mg | ORAL_CAPSULE | Freq: Two times a day (BID) | ORAL | 0 refills | Status: AC
  Filled 2024-12-29: qty 2, 1d supply, fill #0

## 2024-12-29 MED ORDER — GUAIFENESIN ER 600 MG PO TB12
1200.0000 mg | ORAL_TABLET | Freq: Two times a day (BID) | ORAL | 0 refills | Status: AC | PRN
  Filled 2024-12-29: qty 15, 4d supply, fill #0

## 2024-12-29 MED ORDER — CLONIDINE HCL 0.1 MG PO TABS
0.1000 mg | ORAL_TABLET | Freq: Once | ORAL | Status: AC
  Administered 2024-12-29: 0.1 mg via ORAL
  Filled 2024-12-29: qty 1

## 2024-12-29 MED ORDER — AMLODIPINE BESYLATE 10 MG PO TABS
10.0000 mg | ORAL_TABLET | Freq: Every day | ORAL | 0 refills | Status: AC
  Filled 2024-12-29: qty 30, 30d supply, fill #0

## 2024-12-29 MED ORDER — FLUTICASONE FUROATE-VILANTEROL 100-25 MCG/ACT IN AEPB
1.0000 | INHALATION_SPRAY | Freq: Every day | RESPIRATORY_TRACT | 0 refills | Status: AC
  Filled 2024-12-29: qty 60, 30d supply, fill #0

## 2024-12-29 MED ORDER — NICOTINE 21 MG/24HR TD PT24
21.0000 mg | MEDICATED_PATCH | Freq: Every day | TRANSDERMAL | 0 refills | Status: AC
  Filled 2024-12-29: qty 28, 28d supply, fill #0

## 2024-12-29 NOTE — Progress Notes (Signed)
 AVS completed for discharge packet and placed with chart. Blood pressure currently elevated at this time.  Primary nurse is aware.

## 2024-12-29 NOTE — Discharge Summary (Signed)
 "                                                                                                                                                                               Discharge summary note.  Deborah Melendez FMW:[id number] DOB: 1976/11/12 DOA: 12/24/2024  PCP: Delbert Clam, MD  Admit date: 12/24/2024  Discharge date: 12/29/2024  Admitted From: Home   Disposition:  Home   Recommendations for Outpatient Follow-up:   Follow up with PCP in 1-2 weeks  PCP Please obtain BMP/CBC, 2 view CXR in 1week,  (see Discharge instructions)   PCP Please follow up on the following pending results:    Home Health: None   Equipment/Devices: None  Consultations: PCCM Discharge Condition: Stable    CODE STATUS: Full    Diet Recommendation: Heart Healthy     Chief Complaint  Patient presents with   Respiratory Distress     Brief history of present illness from the day of admission and additional interim summary    49 y.o. female with a history of 3L O2-dependent COPD/asthma, substance abuse, and ongoing tobacco use who presented to the ED in respiratory distress. She reports 7 days of progressively worsening severity of cough becoming productive of yellow sputum, nasal discharge, sore throat, and fevers. She has moved from [place], [place] and hasn't established with MD yet, has run out of albuterol  neb solution and doesn't have inhalers, so has only been using nyquil for symptoms with little effect.   She presented to [facility] where she was diagnosed with acute on chronic hypoxic respiratory failure requiring BiPAP/heated high flow oxygen , she was diagnosed with influenza B infection along with respiratory synctial virus infection, she was transferred to [facility] for further care.                                                                 Hospital Course   Acute on chronic hypoxic respiratory failure due to AECOPD due to viral pneumonitis - influenza B  infection along with respiratory synctial virus infection :    She has underlying COPD/asthma, uses 3 L nasal cannula oxygen  at home, still smoking for which she has been counseled to abstain, still doing cocaine along with other recreational drugs, counseled to abstain from all, upon arrival she had significant hypoxia with 50 L of heated high flow now down to 1 L of note she uses 3 L at home, will finish her Tamiflu  treatment  received short course of steroid treatment as well.  In no distress, no wheezes, requested to use I-S and flutter valve at home as well for pulmonary toiletry requested to take the devices home.  She is feeling better than baseline will be discharged home.  Was seen here by pulmonary as well.  SpO2: 94 % O2 Flow Rate (L/min): 1 L/min FiO2 (%): 36 %  Sepsis due to viral infection: No leukocytosis, stable procalcitonin and CRP.  1 out of 2 blood cultures suggesting contamination with coag negative staph.  Continue to monitor with supportive care.  This problem has resolved.   Ongoing smoker.  Counseled to quit.  NicoDerm patch.     Chest tightness: Suspect this is noncardiac.  Resolved.   Ongoing polysubstance abuse including cocaine abuse.  Counseled to abstain.     Essential hypertension.  Stable on below regimen PCP to monitor and adjust   Discharge diagnosis     Principal Problem:   Acute on chronic hypoxic respiratory failure (HCC) Active Problems:   Tobacco use disorder   COPD with acute exacerbation (HCC)   Sepsis [facility])    Discharge instructions    Discharge Instructions     Discharge instructions   Complete by: As directed    Follow with Primary MD Delbert Clam, MD in 7 days   Get CBC, CMP, Magnesium , 2 view Chest X ray -  checked next visit with your primary MD   Activity: As tolerated with Full fall precautions use walker/cane & assistance as needed  Disposition Home    Diet: Heart Healthy    Special Instructions: If you have smoked or  chewed Tobacco  in the last 2 yrs please stop smoking, stop any regular Alcohol  and or any Recreational drug use.  On your next visit with your primary care physician please Get Medicines reviewed and adjusted.  Please request your Prim.MD to go over all Hospital Tests and Procedure/Radiological results at the follow up, please get all Hospital records sent to your Prim MD by signing hospital release before you go home.  If you experience worsening of your admission symptoms, develop shortness of breath, life threatening emergency, suicidal or homicidal thoughts you must seek medical attention immediately by calling 911 or calling your MD immediately  if symptoms less severe.  You Must read complete instructions/literature along with all the possible adverse reactions/side effects for all the Medicines you take and that have been prescribed to you. Take any new Medicines after you have completely understood and accpet all the possible adverse reactions/side effects.   Do not drive when taking Pain medications.  Do not take more than prescribed Pain, Sleep and Anxiety Medications  Wear Seat belts while driving.   Increase activity slowly   Complete by: As directed        Discharge Medications   Allergies as of 12/29/2024   No Known Allergies      Medication List     TAKE these medications    albuterol  (2.5 MG/3ML) 0.083% nebulizer solution Commonly known as: PROVENTIL  Take 3 mLs (2.5 mg total) by nebulization every 6 (six) hours as needed for wheezing or shortness of breath.   albuterol  108 (90 Base) MCG/ACT inhaler Commonly known as: VENTOLIN  HFA Inhale 2 puffs into the lungs every 6 (six) hours as needed for wheezing or shortness of breath.   amLODipine 10 MG tablet Commonly known as: NORVASC Take 1 tablet (10 mg total) by mouth daily.   docusate sodium  100 MG  capsule Commonly known as: COLACE Take 2 capsules (200 mg total) by mouth 2 (two) times daily.   fluticasone   furoate-vilanterol 100-25 MCG/ACT Aepb Commonly known as: BREO ELLIPTA  Inhale 1 puff into the lungs daily.   guaiFENesin  600 MG 12 hr tablet Commonly known as: MUCINEX  Take 2 tablets (1,200 mg total) by mouth 2 (two) times daily as needed.   lisinopril  10 MG tablet Commonly known as: ZESTRIL  Take 1 tablet (10 mg total) by mouth daily.   menthol  3 MG lozenge Commonly known as: CEPACOL Take 1 lozenge (3 mg total) by mouth as needed for sore throat.   nicotine  21 mg/24hr patch Commonly known as: NICODERM CQ  - dosed in mg/24 hours Place 1 patch (21 mg total) onto the skin daily.   oseltamivir  75 MG capsule Commonly known as: TAMIFLU  Take 1 capsule (75 mg total) by mouth 2 (two) times daily.         Follow-up Information     Delbert Clam, MD. Schedule an appointment as soon as possible for a visit in 1 week(s).   Specialty: Family Medicine Why: follow up 7-10 days Contact information: [address] [place] [address] [place] [address] [phone]                 Major procedures and Radiology Reports - PLEASE review detailed and final reports thoroughly  -     DG Chest Port 1 View Result Date: 12/26/2024 EXAM: 1 VIEW(S) XRAY OF THE CHEST 12/26/2024 07:00:00 AM COMPARISON: 12/24/2024 CLINICAL HISTORY: SOB (shortness of breath) FINDINGS: LUNGS AND PLEURA: No focal pulmonary opacity. No pleural effusion. No pneumothorax. HEART AND MEDIASTINUM: No acute abnormality of the cardiac and mediastinal silhouettes. BONES AND SOFT TISSUES: No acute osseous abnormality. IMPRESSION: 1. No acute process. Electronically signed by: Waddell Calk MD 12/26/2024 07:07 AM EST RP Workstation: [id number]   DG Chest Portable 1 View Result Date: 12/24/2024 CLINICAL DATA:  Hypoxia with chest tightness, shortness of breath, cough, fever and chills. EXAM: PORTABLE CHEST 1 VIEW COMPARISON:  November 28, 2024 FINDINGS: The heart size and mediastinal contours are within normal limits. Both  lungs are clear. The visualized skeletal structures are unremarkable. IMPRESSION: No active disease. Electronically Signed   By: Suzen Dials M.D.   On: 12/24/2024 15:36    Micro Results    Recent Results (from the past 240 hours)  Resp panel by RT-PCR (RSV, Flu A&B, Covid) Anterior Nasal Swab     Status: Abnormal   Collection Time: 12/24/24  2:17 PM   Specimen: Anterior Nasal Swab  Result Value Ref Range Status   SARS Coronavirus 2 by RT PCR NEGATIVE NEGATIVE Final   Influenza A by PCR NEGATIVE NEGATIVE Final   Influenza B by PCR POSITIVE (A) NEGATIVE Final    Comment: (NOTE) The Xpert Xpress SARS-CoV-2/FLU/RSV plus assay is intended as an aid in the diagnosis of influenza from Nasopharyngeal swab specimens and should not be used as a sole basis for treatment. Nasal washings and aspirates are unacceptable for Xpert Xpress SARS-CoV-2/FLU/RSV testing.  Fact Sheet for Patients: bloggercourse.com  Fact Sheet for Healthcare Providers: seriousbroker.it  This test is not yet approved or cleared by the [place]  FDA and has been authorized for detection and/or diagnosis of SARS-CoV-2 by FDA under an Emergency Use Authorization (EUA). This EUA will remain in effect (meaning this test can be used) for the duration of the COVID-19 declaration under Section 564(b)(1) of the Act, 21 U.S.C. section 360bbb-3(b)(1), unless the authorization is terminated  or revoked.     Resp Syncytial Virus by PCR POSITIVE (A) NEGATIVE Final    Comment: (NOTE) Fact Sheet for Patients: bloggercourse.com  Fact Sheet for Healthcare Providers: seriousbroker.it  This test is not yet approved or cleared by the [place]  FDA and has been authorized for detection and/or diagnosis of SARS-CoV-2 by FDA under an Emergency Use Authorization (EUA). This EUA will remain in effect (meaning this test can be  used) for the duration of the COVID-19 declaration under Section 564(b)(1) of the Act, 21 U.S.C. section 360bbb-3(b)(1), unless the authorization is terminated or revoked.  Performed at [facility], [address], [place], [place] [address]   Respiratory (~20 pathogens) panel by PCR     Status: Abnormal   Collection Time: 12/24/24  2:17 PM   Specimen: Nasopharyngeal Swab; Respiratory  Result Value Ref Range Status   Adenovirus NOT DETECTED NOT DETECTED Final   Coronavirus 229E NOT DETECTED NOT DETECTED Final    Comment: (NOTE) The Coronavirus on the Respiratory Panel, DOES NOT test for the novel  Coronavirus (2019 nCoV)    Coronavirus HKU1 NOT DETECTED NOT DETECTED Final   Coronavirus NL63 NOT DETECTED NOT DETECTED Final   Coronavirus OC43 NOT DETECTED NOT DETECTED Final   Metapneumovirus NOT DETECTED NOT DETECTED Final   Rhinovirus / Enterovirus NOT DETECTED NOT DETECTED Final   Influenza A NOT DETECTED NOT DETECTED Final   Influenza B DETECTED (A) NOT DETECTED Final   Parainfluenza Virus 1 NOT DETECTED NOT DETECTED Final   Parainfluenza Virus 2 NOT DETECTED NOT DETECTED Final   Parainfluenza Virus 3 NOT DETECTED NOT DETECTED Final   Parainfluenza Virus 4 NOT DETECTED NOT DETECTED Final   Respiratory Syncytial Virus DETECTED (A) NOT DETECTED Final   Bordetella pertussis NOT DETECTED NOT DETECTED Final   Bordetella Parapertussis NOT DETECTED NOT DETECTED Final   Chlamydophila pneumoniae NOT DETECTED NOT DETECTED Final   Mycoplasma pneumoniae NOT DETECTED NOT DETECTED Final    Comment: Performed at [facility], [address] [place], [place] [address]  Blood Culture (routine x 2)     Status: None   Collection Time: 12/24/24  2:35 PM   Specimen: BLOOD  Result Value Ref Range Status   Specimen Description BLOOD BLOOD RIGHT FOREARM  Final   Special Requests   Final    BOTTLES DRAWN AEROBIC AND ANAEROBIC Blood Culture results may not be optimal due to an  inadequate volume of blood received in culture bottles   Culture   Final    NO GROWTH 5 DAYS Performed at [facility], [address] [place], [place] [address]    Report Status 12/29/2024 FINAL  Final  Blood Culture (routine x 2)     Status: Abnormal   Collection Time: 12/24/24  2:45 PM   Specimen: BLOOD RIGHT HAND  Result Value Ref Range Status   Specimen Description BLOOD RIGHT HAND  Final   Special Requests   Final    BOTTLES DRAWN AEROBIC AND ANAEROBIC Blood Culture results may not be optimal due to an inadequate volume of blood received in culture bottles   Culture  Setup Time   Final    GRAM POSITIVE COCCI IN CLUSTERS AEROBIC BOTTLE ONLY CRITICAL RESULT CALLED TO, READ BACK BY AND VERIFIED WITH: PHARMD ERIN W. [id number] AT 1941, ADC    Culture (A)  Final    STAPHYLOCOCCUS CAPITIS THE SIGNIFICANCE OF ISOLATING THIS ORGANISM FROM A SINGLE SET OF BLOOD CULTURES WHEN  MULTIPLE SETS ARE DRAWN IS UNCERTAIN. PLEASE NOTIFY THE MICROBIOLOGY DEPARTMENT WITHIN ONE WEEK IF SPECIATION AND SENSITIVITIES ARE REQUIRED. Performed at [facility], [address], [place], [place] [address]    Report Status 12/27/2024 FINAL  Final  Blood Culture ID Panel (Reflexed)     Status: Abnormal   Collection Time: 12/24/24  2:45 PM  Result Value Ref Range Status   Enterococcus faecalis NOT DETECTED NOT DETECTED Final   Enterococcus Faecium NOT DETECTED NOT DETECTED Final   Listeria monocytogenes NOT DETECTED NOT DETECTED Final   Staphylococcus species DETECTED (A) NOT DETECTED Final    Comment: CRITICAL RESULT CALLED TO, READ BACK BY AND VERIFIED WITH: PHARMD ERIN W. [id number] AT 1941, ADC    Staphylococcus aureus (BCID) NOT DETECTED NOT DETECTED Final   Staphylococcus epidermidis NOT DETECTED NOT DETECTED Final   Staphylococcus lugdunensis NOT DETECTED NOT DETECTED Final   Streptococcus species NOT DETECTED NOT DETECTED Final   Streptococcus agalactiae NOT DETECTED NOT DETECTED Final    Streptococcus pneumoniae NOT DETECTED NOT DETECTED Final   Streptococcus pyogenes NOT DETECTED NOT DETECTED Final   A.calcoaceticus-baumannii NOT DETECTED NOT DETECTED Final   Bacteroides fragilis NOT DETECTED NOT DETECTED Final   Enterobacterales NOT DETECTED NOT DETECTED Final   Enterobacter cloacae complex NOT DETECTED NOT DETECTED Final   Escherichia coli NOT DETECTED NOT DETECTED Final   Klebsiella aerogenes NOT DETECTED NOT DETECTED Final   Klebsiella oxytoca NOT DETECTED NOT DETECTED Final   Klebsiella pneumoniae NOT DETECTED NOT DETECTED Final   Proteus species NOT DETECTED NOT DETECTED Final   Salmonella species NOT DETECTED NOT DETECTED Final   Serratia marcescens NOT DETECTED NOT DETECTED Final   Haemophilus influenzae NOT DETECTED NOT DETECTED Final   Neisseria meningitidis NOT DETECTED NOT DETECTED Final   Pseudomonas aeruginosa NOT DETECTED NOT DETECTED Final   Stenotrophomonas maltophilia NOT DETECTED NOT DETECTED Final   Candida albicans NOT DETECTED NOT DETECTED Final   Candida auris NOT DETECTED NOT DETECTED Final   Candida glabrata NOT DETECTED NOT DETECTED Final   Candida krusei NOT DETECTED NOT DETECTED Final   Candida parapsilosis NOT DETECTED NOT DETECTED Final   Candida tropicalis NOT DETECTED NOT DETECTED Final   Cryptococcus neoformans/gattii NOT DETECTED NOT DETECTED Final    Comment: Performed at [facility], [address], [place], [place] [address]  MRSA Next Gen by PCR, Nasal     Status: None   Collection Time: 12/26/24  5:51 AM   Specimen: Nasal Mucosa; Nasal Swab  Result Value Ref Range Status   MRSA by PCR Next Gen NOT DETECTED NOT DETECTED Final    Comment: (NOTE) The GeneXpert MRSA Assay (FDA approved for NASAL specimens only), is one component of a comprehensive MRSA colonization surveillance program. It is not intended to diagnose MRSA infection nor to guide or monitor treatment for MRSA infections. Test performance is not FDA approved  in patients less than 54 years old. Performed at [facility], [address] [place], [place] [address]     Today   Subjective    Deborah Melendez today has no headache,no chest abdominal pain,no new weakness tingling or numbness, feels much better wants to go home today.     Objective   Blood pressure 160/80, pulse 84, temperature 97.8 F (36.6 C), temperature source Axillary, resp. rate 18, height 5' 4 (1.626 m), weight 68 kg, SpO2 94%.  No intake or output data in the 24 hours ending 12/29/24 0849  Exam  Awake Alert, No new F.N deficits,    [place].AT,PERRAL Supple Neck,   Symmetrical Chest wall movement, Good air movement bilaterally, CTAB RRR,No Gallops,   +ve B.Sounds, Abd Soft, Non tender,  No Cyanosis, Clubbing or edema    Data Review   Recent Labs  Lab 12/24/24 2001 12/25/24 0828 12/26/24 0610 12/27/24 0439 12/28/24 0417  WBC 4.4 6.4 3.2* 5.6 6.5  HGB 13.8 14.3 14.1 13.4 13.4  HCT 41.3 43.4 42.3 41.4 41.2  PLT 160 171 148* 151 157  MCV 96.0 98.9 96.8 98.1 97.4  MCH 32.1 32.6 32.3 31.8 31.7  MCHC 33.4 32.9 33.3 32.4 32.5  RDW 13.6 13.7 13.7 13.7 13.8  LYMPHSABS 0.3*  0.3*  --  0.5* 0.8 1.2  MONOABS 0.1  0.1  --  0.2 0.3 0.4  EOSABS 0.0  0.0  --  0.0 0.0 0.0  BASOSABS 0.0  0.0  --  0.0 0.0 0.0    Recent Labs  Lab 12/24/24 1351 12/24/24 1426 12/24/24 1502 12/24/24 1528 12/24/24 1737 12/24/24 2001 12/25/24 0828 12/26/24 0610 12/27/24 0439 12/28/24 0417  NA 132*  --   --   --   --   --  138 139 139 138  K 4.0  --   --   --   --   --  4.2 4.8 4.3 3.8  CL 98  --   --   --   --   --  105 103 101 101  CO2 19*  --   --   --   --   --  22 29 32 30  ANIONGAP 15  --   --   --   --   --  12 8 7 7   GLUCOSE 103*  --   --   --   --   --  111* 136* 138* 183*  BUN 10  --   --   --   --   --  9 11 16 17   CREATININE 0.96  --   --   --   --   --  0.84 0.72 0.69 0.68  AST  --  48*  --   --   --   --  41  --   --   --   ALT  --  27  --   --   --   --   20  --   --   --   ALKPHOS  --  90  --   --   --   --  72  --   --   --   BILITOT  --  0.5  --   --   --   --  0.3  --   --   --   ALBUMIN   --  4.4  --   --   --   --  3.9  --   --   --   CRP  --   --   --   --   --   --   --  1.4* <0.5  --   PROCALCITON  --   --   --   --   --  0.14  --  <0.10 <0.10  --   LATICACIDVEN  --   --  1.4 1.7 1.7  --   --   --   --   --   INR  --  1.0  --   --   --   --   --   --   --   --  MG  --   --   --   --   --   --   --   --  2.5*  --   CALCIUM 9.2  --   --   --   --   --  8.4* 8.7* 8.8* 8.4*    Total Time in preparing paper work, data evaluation and todays exam - 35 minutes  Signature  -    Lavada Stank M.D on 12/29/2024 at 8:49 AM   -  To page go to [email or web address]      "

## 2024-12-29 NOTE — Progress Notes (Signed)
" ° °  NAME:  Deborah Melendez, MRN:  [id number], DOB:  November 13, 1976, LOS: 5 ADMISSION DATE:  12/24/2024, CONSULTATION DATE: 12/26/2024 REFERRING MD: Dr. Dennise, CHIEF COMPLAINT: Hypoxemic respiratory failure  History of Present Illness:  49 year old lady with a history of asthma/COPD, chronic respiratory failure on oxygen  supplementation who came in with worsening shortness of breath, found to have exacerbation of chronic respiratory failure with viral respiratory infection positive for RSV and influenza B Now on high flow nasal cannula Ongoing tobacco use, history of substance abuse.  Usually on 3 L of oxygen  at home Had about 1 week of worsening shortness of breath, productive cough of yellow sputum, sore throat, fevers She has also run out of her usual inhalers.  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 rate 18, height 5' 4 (1.626 m), weight 68 kg, SpO2 94%.       No intake or output data in the 24 hours ending  12/29/24 1157  Filed Weights   12/24/24 1357  Weight: 68 kg    Examination: General: Middle-age, does not appear to be in distress  HENT: Moist oral mucosa Lungs: Bilateral rhonchi Cardiovascular: S1-S2 appreciated Abdomen: Soft, bowel sounds appreciated Extremities: No edema, no clubbing Neuro: Alert and oriented x 3  I reviewed last 24 h vitals and pain scores, last 48 h intake and output, last 24 h labs and trends, and last 24 h imaging results.  Acute on chronic respiratory failure Acute exacerbation of COPD Influenza B and RSV infection Underlying COPD for which she is on oxygen  at baseline Appears to be improving - To complete Tamiflu  Smoking cessation counseling  Graded activities as tolerated  No significant change can make is to quit smoking  Agree with discharge today  "

## 2024-12-29 NOTE — TOC Progression Note (Signed)
 Transition of Care [facility]) - Progression Note    Patient Details  Name: Deborah Melendez MRN: [id number] Date of Birth: 1976-12-01  Transition of [facility]) CM/SW Contact  Nola Devere Hands, RN Phone Number: 12/29/2024, 9:16 AM  Clinical Narrative:     Case Manager completed MATCH for patient. Submitted to Pharmacy.                     Expected Discharge Plan and Services   Discharge Planning Services: [facility]     Expected Discharge Date: 12/29/24                                     Social Drivers of Health (SDOH) Interventions SDOH Screenings   Food Insecurity: No Food Insecurity (12/25/2024)  Housing: Low Risk (12/25/2024)  Transportation Needs: No Transportation Needs (12/25/2024)  Utilities: Not At Risk (12/25/2024)  Tobacco Use: High Risk (12/24/2024)    Readmission Risk Interventions     No data to display

## 2024-12-29 NOTE — Plan of Care (Signed)
   Problem: Clinical Measurements: Goal: Diagnostic test results will improve Outcome: Progressing

## 2024-12-29 NOTE — Discharge Instructions (Signed)
 Follow with Primary MD Delbert Clam, MD in 7 days   Get CBC, CMP, Magnesium , 2 view Chest X ray -  checked next visit with your primary MD    Activity: As tolerated with Full fall precautions use walker/cane & assistance as needed  Disposition Home    Diet: Heart Healthy     Special Instructions: If you have smoked or chewed Tobacco  in the last 2 yrs please stop smoking, stop any regular Alcohol  and or any Recreational drug use.  On your next visit with your primary care physician please Get Medicines reviewed and adjusted.  Please request your Prim.MD to go over all Hospital Tests and Procedure/Radiological results at the follow up, please get all Hospital records sent to your Prim MD by signing hospital release before you go home.  If you experience worsening of your admission symptoms, develop shortness of breath, life threatening emergency, suicidal or homicidal thoughts you must seek medical attention immediately by calling 911 or calling your MD immediately  if symptoms less severe.  You Must read complete instructions/literature along with all the possible adverse reactions/side effects for all the Medicines you take and that have been prescribed to you. Take any new Medicines after you have completely understood and accpet all the possible adverse reactions/side effects.   Do not drive when taking Pain medications.  Do not take more than prescribed Pain, Sleep and Anxiety Medications  Wear Seat belts while driving.

## 2024-12-29 NOTE — Progress Notes (Signed)
 PT Cancellation Note  Patient Details Name: Deborah Melendez MRN: [id number] DOB: [birth year]   Cancelled Treatment:     Pt declining PT session at this time. Pt declining this week due to symptoms. Pt anticipating discharge home today. Declining PT needs and reports no concerns with mobility. PT to discontinue orders at this time. Re-consult if mobility needs change or pt becomes agreeable to work with rehab.   [name], PT, DPT  Acute Rehabilitation Services         Office: [phone]      Sabra MARLA Morel 12/29/2024, 3:01 PM

## 2025-01-01 ENCOUNTER — Telehealth: Payer: Self-pay

## 2025-01-01 NOTE — Transitions of Care (Post Inpatient/ED Visit) (Signed)
" ° °  01/01/2025  Name: Deborah Melendez MRN: [id number] DOB: November 12, 1976  Today's TOC FU Call Status: Today's TOC FU Call Status:: Unsuccessful Call (1st Attempt) Unsuccessful Call (1st Attempt) Date: 01/01/25  Attempted to reach the patient regarding the most recent Inpatient/ED visit.  Follow Up Plan: Additional outreach attempts will be made to reach the patient to complete the Transitions of Care (Post Inpatient/ED visit) call.   The recording stated that the call cannot be completed at this time   Signature  Slater Diesel, RN   "

## 2025-01-02 ENCOUNTER — Telehealth: Payer: Self-pay

## 2025-01-02 NOTE — Transitions of Care (Post Inpatient/ED Visit) (Signed)
" ° °  01/02/2025  Name: Deborah Melendez MRN: [id number] DOB: July 26, 1976  Today's TOC FU Call Status: Today's TOC FU Call Status:: Unsuccessful Call (2nd Attempt) Unsuccessful Call (1st Attempt) Date: 01/01/25 Unsuccessful Call (2nd Attempt) Date: 01/02/25  Attempted to reach the patient regarding the most recent Inpatient/ED visit.  Follow Up Plan: Additional outreach attempts will be made to reach the patient to complete the Transitions of Care (Post Inpatient/ED visit) call.   Signature  Slater Diesel, RN   "

## 2025-01-03 ENCOUNTER — Telehealth: Payer: Self-pay

## 2025-01-03 NOTE — Transitions of Care (Post Inpatient/ED Visit) (Signed)
" ° °  01/03/2025  Name: Deborah Melendez MRN: [id number] DOB: [birth year]  Today's TOC FU Call Status: Today's TOC FU Call Status:: Unsuccessful Call (3rd Attempt) Unsuccessful Call (1st Attempt) Date: 01/01/25 Unsuccessful Call (2nd Attempt) Date: 01/02/25 Unsuccessful Call (3rd Attempt) Date: 01/03/25  Attempted to reach the patient regarding the most recent Inpatient/ED visit.  Follow Up Plan: No further outreach attempts will be made at this time. We have been unable to contact the patient.  I called [phone] and [phone] and the recording for both numbers state that the call cannot be completed at this time. I also tried to reach her mother, Corinthian Kemler: [phone] and the voicemail was full.  Letter sent to patient requesting she call the clinic to schedule a follow up appointment as we have not been able to reach her  Dr Delbert is listed as her PCP but she has never seen her.  The patient was last seen at [facility] 07/2019.  Signature Slater Diesel, RN   "

## 2025-01-23 ENCOUNTER — Emergency Department (HOSPITAL_COMMUNITY): Payer: MEDICAID

## 2025-01-23 ENCOUNTER — Other Ambulatory Visit: Payer: Self-pay

## 2025-01-23 ENCOUNTER — Encounter (HOSPITAL_COMMUNITY): Payer: Self-pay | Admitting: Emergency Medicine

## 2025-01-23 ENCOUNTER — Emergency Department (HOSPITAL_COMMUNITY)
Admission: EM | Admit: 2025-01-23 | Discharge: 2025-01-23 | Disposition: A | Discharge status: Home / Self Care | Payer: MEDICAID | Attending: Emergency Medicine | Admitting: Emergency Medicine

## 2025-01-23 DIAGNOSIS — F17210 Nicotine dependence, cigarettes, uncomplicated: Secondary | ICD-10-CM | POA: Insufficient documentation

## 2025-01-23 DIAGNOSIS — J441 Chronic obstructive pulmonary disease with (acute) exacerbation: Secondary | ICD-10-CM | POA: Insufficient documentation

## 2025-01-23 DIAGNOSIS — Z79899 Other long term (current) drug therapy: Secondary | ICD-10-CM | POA: Insufficient documentation

## 2025-01-23 LAB — CBC
HCT: 43 % (ref 36.0–46.0)
Hemoglobin: 14.2 g/dL (ref 12.0–15.0)
MCH: 32.1 pg (ref 26.0–34.0)
MCHC: 33 g/dL (ref 30.0–36.0)
MCV: 97.1 fL (ref 80.0–100.0)
Platelets: 250 10*3/uL (ref 150–400)
RBC: 4.43 MIL/uL (ref 3.87–5.11)
RDW: 14.3 % (ref 11.5–15.5)
WBC: 7.3 10*3/uL (ref 4.0–10.5)
nRBC: 0 % (ref 0.0–0.2)

## 2025-01-23 LAB — I-STAT VENOUS BLOOD GAS, ED
Acid-base deficit: 3 mmol/L — ABNORMAL HIGH (ref 0.0–2.0)
Bicarbonate: 22.7 mmol/L (ref 20.0–28.0)
Calcium, Ion: 1.15 mmol/L (ref 1.15–1.40)
HCT: 43 % (ref 36.0–46.0)
Hemoglobin: 14.6 g/dL (ref 12.0–15.0)
O2 Saturation: 80 %
Potassium: 3.8 mmol/L (ref 3.5–5.1)
Sodium: 141 mmol/L (ref 135–145)
TCO2: 24 mmol/L (ref 22–32)
pCO2, Ven: 40.7 mmHg — ABNORMAL LOW (ref 44–60)
pH, Ven: 7.353 (ref 7.25–7.43)
pO2, Ven: 46 mmHg — ABNORMAL HIGH (ref 32–45)

## 2025-01-23 LAB — TROPONIN T, HIGH SENSITIVITY
Troponin T High Sensitivity: <6 ng/L (ref 0–19)
Troponin T High Sensitivity: <6 ng/L (ref 0–19)

## 2025-01-23 LAB — BASIC METABOLIC PANEL WITH GFR
Anion gap: 13 (ref 5–15)
BUN: 9 mg/dL (ref 6–20)
CO2: 21 mmol/L — ABNORMAL LOW (ref 22–32)
Calcium: 8.8 mg/dL — ABNORMAL LOW (ref 8.9–10.3)
Chloride: 106 mmol/L (ref 98–111)
Creatinine, Ser: 0.76 mg/dL (ref 0.44–1.00)
GFR, Estimated: >60 mL/min
Glucose, Bld: 97 mg/dL (ref 70–99)
Potassium: 4 mmol/L (ref 3.5–5.1)
Sodium: 140 mmol/L (ref 135–145)

## 2025-01-23 LAB — RESP PANEL BY RT-PCR (RSV, FLU A&B, COVID)  RVPGX2
Influenza A by PCR: NEGATIVE
Influenza B by PCR: NEGATIVE
Resp Syncytial Virus by PCR: NEGATIVE
SARS Coronavirus 2 by RT PCR: NEGATIVE

## 2025-01-23 LAB — I-STAT CG4 LACTIC ACID, ED: Lactic Acid, Venous: 1.1 mmol/L (ref 0.5–1.9)

## 2025-01-23 MED ORDER — LORAZEPAM 2 MG/ML IJ SOLN
1.0000 mg | Freq: Once | INTRAMUSCULAR | Status: AC
  Administered 2025-01-23: 1 mg via INTRAVENOUS
  Filled 2025-01-23: qty 1

## 2025-01-23 MED ORDER — ALBUTEROL SULFATE HFA 108 (90 BASE) MCG/ACT IN AERS
1.0000 | INHALATION_SPRAY | Freq: Four times a day (QID) | RESPIRATORY_TRACT | 0 refills | Status: AC | PRN
  Filled 2025-01-23: qty 6.7, 25d supply, fill #0

## 2025-01-23 MED ORDER — KETOROLAC TROMETHAMINE 15 MG/ML IJ SOLN
15.0000 mg | Freq: Once | INTRAMUSCULAR | Status: AC
  Administered 2025-01-23: 15 mg via INTRAVENOUS
  Filled 2025-01-23: qty 1

## 2025-01-23 MED ORDER — MAGNESIUM SULFATE 2 GM/50ML IV SOLN
2.0000 g | Freq: Once | INTRAVENOUS | Status: AC
  Administered 2025-01-23: 2 g via INTRAVENOUS
  Filled 2025-01-23: qty 50

## 2025-01-23 MED ORDER — AZITHROMYCIN 250 MG PO TABS
ORAL_TABLET | ORAL | 0 refills | Status: AC
  Filled 2025-01-23 (×2): qty 6, 5d supply, fill #0

## 2025-01-23 MED ORDER — IOHEXOL 350 MG/ML SOLN
75.0000 mL | Freq: Once | INTRAVENOUS | Status: AC | PRN
  Administered 2025-01-23: 75 mL via INTRAVENOUS

## 2025-01-23 MED ORDER — ALBUTEROL SULFATE (2.5 MG/3ML) 0.083% IN NEBU
10.0000 mg | INHALATION_SOLUTION | Freq: Once | RESPIRATORY_TRACT | Status: AC
  Administered 2025-01-23: 10 mg via RESPIRATORY_TRACT
  Filled 2025-01-23: qty 12

## 2025-01-23 MED ORDER — BENZONATATE 100 MG PO CAPS
100.0000 mg | ORAL_CAPSULE | Freq: Three times a day (TID) | ORAL | 0 refills | Status: AC | PRN
  Filled 2025-01-23: qty 21, 7d supply, fill #0

## 2025-01-23 MED ORDER — PREDNISONE 20 MG PO TABS
40.0000 mg | ORAL_TABLET | Freq: Every day | ORAL | 0 refills | Status: AC
  Filled 2025-01-23 (×2): qty 8, 4d supply, fill #0

## 2025-01-23 NOTE — Discharge Instructions (Signed)
 As discussed, I believe that you are having an asthma exacerbation this evening.  After giving you steroids, breathing treatments your breathing improved and you report that you are back to your baseline.  I am discharging you with steroids.  Please begin taking these, 40 mg, once a day for 4 days.  Please utilize albuterol  inhaler sent to your pharmacy.  I have also sent Tessalon  Perles to your pharmacy for your cough at your request.  Please follow-up with your PCP for further care.  If you develop any worsening shortness of breath please return to the ED.  Please discontinue use of tobacco as well as illicit drugs.

## 2025-01-23 NOTE — ED Notes (Addendum)
 Trial pt off bipap, tolerated well. Pt places on baseline 4L [place], O2 at 100%. CT called to come get pt for CTA

## 2025-01-23 NOTE — ED Notes (Addendum)
 Pt being loud and aggressive towards CT staff, this RN came in the room to try and explain the importance of the CTA, pt said I don't want to hear it. Attempted to deescalate pt without success. Pt went to CT, still being rude and aggressive towards staff. PA notified.

## 2025-01-23 NOTE — ED Provider Notes (Signed)
 " [place] EMERGENCY DEPARTMENT AT [facility] Provider Note   CSN: [id number] Arrival date & time: 01/23/25  0036     Patient presents with: Shortness of Breath   Deborah Melendez is a 49 y.o. female with history of cocaine use, bipolar 1, asthma, bronchitis, chronic respiratory failure on 3 L oxygen , tobacco abuse, COPD.  Presents to ED complaining of shortness of breath.  Patient arrives with EMS on nonrebreather.  Patient reports that for the last 3 days she has had progressively worsening shortness of breath associated with centralized chest pain that does not radiate.  Reports chest pain and shortness of breath do worsen when she ambulates.  She reports that she chronically wears oxygen  at home, 3 L, and has increased recently to 4 which did help her shortness of breath.  She states that she still smokes cigarettes, last had cigarette a few hours prior to ED arrival.  She endorses recent cocaine use.  She was recently admitted to the hospital secondary to COPD exacerbation due to viral pneumonitis (RSV and flu B).  She was admitted for 5 or 6 days and then discharged.  She states that she has been doing fine at home until the last 3 days.   Shortness of Breath Associated symptoms: chest pain        Prior to Admission medications  Medication Sig Start Date End Date Taking? Authorizing Provider  albuterol  (VENTOLIN  HFA) 108 (90 Base) MCG/ACT inhaler Inhale 1-2 puffs into the lungs every 6 (six) hours as needed for wheezing or shortness of breath. 01/23/25  Yes Ruthell Lonni FALCON, PA-C  azithromycin  (ZITHROMAX ) 250 MG tablet Take 2 tablets (500 mg total) by mouth daily for 1 day, THEN 1 tablet (250 mg total) daily for 4 days. Take first 2 tablets together, then 1 every day until finished. 01/23/25 01/28/25 Yes Ruthell Lonni FALCON, PA-C  benzonatate  (TESSALON ) 100 MG capsule Take 1 capsule (100 mg total) by mouth every 8 (eight) hours as needed for cough. 01/23/25  Yes  Ruthell Lonni FALCON, PA-C  predniSONE  (DELTASONE ) 20 MG tablet Take 2 tablets (40 mg total) by mouth daily. 01/23/25  Yes Ruthell Lonni FALCON, PA-C  amLODipine  (NORVASC ) 10 MG tablet Take 1 tablet (10 mg total) by mouth daily. 12/29/24   Singh, Prashant K, MD  docusate sodium  (COLACE) 100 MG capsule Take 2 capsules (200 mg total) by mouth 2 (two) times daily. 12/29/24   Singh, Prashant K, MD  fluticasone  furoate-vilanterol (BREO ELLIPTA ) 100-25 MCG/ACT AEPB Inhale 1 puff into the lungs daily. 12/29/24   Singh, Prashant K, MD  guaiFENesin  (MUCINEX ) 600 MG 12 hr tablet Take 2 tablets (1,200 mg total) by mouth 2 (two) times daily as needed. 12/29/24   Singh, Prashant K, MD  lisinopril  (ZESTRIL ) 10 MG tablet Take 1 tablet (10 mg total) by mouth daily. 11/28/24   Jerral Meth, MD  menthol  (CEPACOL) 3 MG lozenge Take 1 lozenge (3 mg total) by mouth as needed for sore throat. 12/29/24   Singh, Prashant K, MD  nicotine  (NICODERM CQ  - DOSED IN MG/24 HOURS) 21 mg/24hr patch Place 1 patch (21 mg total) onto the skin daily. 12/29/24   Dennise Lavada POUR, MD  oseltamivir  (TAMIFLU ) 75 MG capsule Take 1 capsule (75 mg total) by mouth 2 (two) times daily. 12/29/24   Singh, Prashant K, MD    Allergies: Patient has no known allergies.    Review of Systems  Respiratory:  Positive for shortness of breath.  Cardiovascular:  Positive for chest pain.  All other systems reviewed and are negative.   Updated Vital Signs BP (!) 124/107   Pulse (!) 109   Temp 98 F (36.7 C) (Oral)   Resp (!) 23   Ht 5' 4 (1.626 m)   Wt 68 kg   SpO2 100%   BMI 25.75 kg/m   Physical Exam Vitals and nursing note reviewed.  Constitutional:      General: She is not in acute distress.    Appearance: She is well-developed.  HENT:     Head: Normocephalic and atraumatic.  Eyes:     Conjunctiva/sclera: Conjunctivae normal.  Cardiovascular:     Rate and Rhythm: Regular rhythm. Tachycardia present.     Heart sounds: No murmur  heard. Pulmonary:     Effort: Pulmonary effort is normal. No respiratory distress.     Breath sounds: Wheezing present.  Abdominal:     Palpations: Abdomen is soft.     Tenderness: There is no abdominal tenderness.  Musculoskeletal:        General: No swelling.     Cervical back: Neck supple.  Skin:    General: Skin is warm and dry.     Capillary Refill: Capillary refill takes less than 2 seconds.  Neurological:     Mental Status: She is alert and oriented to person, place, and time. Mental status is at baseline.  Psychiatric:        Mood and Affect: Mood normal.      (all labs ordered are listed, but only abnormal results are displayed) Labs Reviewed  BASIC METABOLIC PANEL WITH GFR - Abnormal; Notable for the following components:      Result Value   CO2 21 (*)    Calcium 8.8 (*)    All other components within normal limits  I-STAT VENOUS BLOOD GAS, ED - Abnormal; Notable for the following components:   pCO2, Ven 40.7 (*)    pO2, Ven 46 (*)    Acid-base deficit 3.0 (*)    All other components within normal limits  RESP PANEL BY RT-PCR (RSV, FLU A&B, COVID)  RVPGX2  CBC  I-STAT CG4 LACTIC ACID, ED  TROPONIN T, HIGH SENSITIVITY  TROPONIN T, HIGH SENSITIVITY    EKG: None  Radiology: CT Angio Chest PE W and/or Wo Contrast Result Date: 01/23/2025 EXAM: CTA of the Chest with contrast for PE 01/23/2025 04:17:00 AM TECHNIQUE: CTA of the chest was performed after the administration of 75 mL of iohexol  (OMNIPAQUE ) 350 MG/ML injection. Multiplanar reformatted images are provided for review. MIP images are provided for review. Automated exposure control, iterative reconstruction, and/or weight based adjustment of the mA/kV was utilized to reduce the radiation dose to as low as reasonably achievable. COMPARISON: CTA chest 10/14/2018 CLINICAL HISTORY: 49 year old female with 3 days of shortness of breath, congestion, nausea, vomiting, recently diagnosed with influenza. Pulmonary  embolism (PE) suspected, high probability. FINDINGS: PULMONARY ARTERIES: Excellent contrast timing. Pulmonary arteries are adequately opacified for evaluation. No pulmonary embolism. Main pulmonary artery is normal in caliber. MEDIASTINUM: The heart and pericardium demonstrate no acute abnormality. There is no acute abnormality of the thoracic aorta. LYMPH NODES: No mediastinal, hilar or axillary lymphadenopathy. LUNGS AND PLEURA: Right upper lobe azygos lobe and fissure, normal variant. Suspicion of generalized mild bilateral bronchial wall thickening (such as series 10 image 99), but the major airways remain patent. Chronic curvilinear opacity in the anterior basal segment of the left lower lobe appears to be stable scarring. There  is mild enhancing atelectasis in the posterior right costophrenic angle. No consolidation or convincing lung parenchymal inflammation. No pleural effusion or pneumothorax. UPPER ABDOMEN: Limited images of the upper abdomen are unremarkable. SOFT TISSUES AND BONES: No acute bone or soft tissue abnormality. IMPRESSION: 1. No pulmonary embolism. 2. Mild diffuse bronchial wall thickening, suggestive of bronchitis. No other evidence of acute lung infection. Electronically signed by: Helayne Hurst MD 01/23/2025 04:38 AM EST RP Workstation: [id number]   DG Chest Portable 1 View Result Date: 01/23/2025 EXAM: 1 VIEW XRAY OF THE CHEST 01/23/2025 01:19:23 AM COMPARISON: 12/26/2024 CLINICAL HISTORY: Shortness of breath. FINDINGS: LUNGS AND PLEURA: No focal pulmonary opacity. No pleural effusion. No pneumothorax. HEART AND MEDIASTINUM: No acute abnormality of the cardiac and mediastinal silhouettes. BONES AND SOFT TISSUES: No acute osseous abnormality. IMPRESSION: 1. No acute process. Electronically signed by: Oneil Devonshire MD 01/23/2025 01:23 AM EST RP Workstation: [id number]    Procedures   Medications Ordered in the ED  albuterol  (PROVENTIL ) (2.5 MG/3ML) 0.083% nebulizer solution 10 mg (10  mg Nebulization Given 01/23/25 0206)  magnesium  sulfate IVPB 2 g 50 mL (0 g Intravenous Stopped 01/23/25 0157)  LORazepam  (ATIVAN ) injection 1 mg (1 mg Intravenous Given 01/23/25 0059)  ketorolac  (TORADOL ) 15 MG/ML injection 15 mg (15 mg Intravenous Given 01/23/25 0327)  iohexol  (OMNIPAQUE ) 350 MG/ML injection 75 mL (75 mLs Intravenous Contrast Given 01/23/25 0416)     Medical Decision Making Amount and/or Complexity of Data Reviewed Labs: ordered. Radiology: ordered.  Risk Prescription drug management.   49 year old female presents for evaluation.  On exam, HD stable however tachycardic.  Afebrile.  Lung sounds with wheezing throughout, patient arrives on nonrebreather with oxygen  saturation 100%.  Abdomen soft and compressible.  Neuroexam at baseline.  Patient assessed utilizing CBC, BMP, lactic acid, viral panel, troponin, i-STAT VBG, chest x-ray, CTA.  Given magnesium , albuterol .  Patient had already received Solu-Medrol  with EMS.  Patient was placed on BiPAP at her request upon arrival to the ED.  She continuously requested Ativan  after being placed on BiPAP.  She had oxygen  with saturation 100% and was not tachypneic at this time, not hypertensive however patient was placed on BiPAP at her request.  She was ultimately weaned off of BiPAP without difficulty after about 45 minutes during her 5 hour and 16-minute ED visit.  EKG is nonischemic, shows sinus tachycardia.  CBC without leukocytosis or anemia.  Metabolic panel is grossly unremarkable without electrolyte derangement, stable creatinine, anion gap 13.  Viral panel negative for all.  Acid not elevated at 1.1.  Troponins undetectable x 2.  VBG unremarkable.  Chest x-ray unremarkable.  Patient did receive CTA due to tachycardia but CTA unremarkable and does show bronchitis.  Tachycardia most likely due to albuterol  as well as crack cocaine use.  At this time patient reports shortness of breath is resolved.  She is not tachypneic,  not showing signs of respiratory distress.  She is resting comfortably in the bed and sleeping.  She reports that she has oxygen  at home.  At this time the patient does seem stable for discharge.  Patient discharged at this time with [facility] to ensure patient receives oxygen  en route to her house as she is a chronic oxygen  user. Patient discussed with Dr. Lorette prior to discharge who approves of plan of management.  Patient sent home with azithromycin , albuterol  inhaler, steroid burst for suspected COPD exacerbation.  She was advised to follow-up outpatient.  Given very strict return precautions and she voiced understanding.  Stable to discharge.   Final diagnoses:  COPD exacerbation [facility])    ED Discharge Orders          Ordered    predniSONE  (DELTASONE ) 20 MG tablet  Daily        01/23/25 0544    albuterol  (VENTOLIN  HFA) 108 (90 Base) MCG/ACT inhaler  Every 6 hours PRN        01/23/25 0544    benzonatate  (TESSALON ) 100 MG capsule  Every 8 hours PRN        01/23/25 0544    azithromycin  (ZITHROMAX ) 250 MG tablet  Daily        01/23/25 0547               Ruthell Lonni FALCON, PA-C 01/23/25 0612  "

## 2025-01-23 NOTE — ED Triage Notes (Signed)
 Pt BIB [facility] coming from home with c/o of SHOB as of 3 days ago. Patient also presents with N/V and congestion. Recent diagnosis of flu and RSV. Hx of COPD, asthma and patient reports use of breathing treatments 3x day   EMS: 120 HR 180/112 98% w/ nebulizer Duoneb x1 125 solu-medrol 

## 2025-02-28 ENCOUNTER — Ambulatory Visit: Payer: Self-pay | Admitting: Family
